# Patient Record
Sex: Female | Born: 1945 | Race: White | Hispanic: No | Smoking: Never smoker
Health system: Southern US, Community
[De-identification: ages and names within clinical notes are randomized; demographics above are authoritative.]

## PROBLEM LIST (undated history)

## (undated) DIAGNOSIS — T7840XA Allergy, unspecified, initial encounter: Secondary | ICD-10-CM

## (undated) DIAGNOSIS — D126 Benign neoplasm of colon, unspecified: Secondary | ICD-10-CM

## (undated) DIAGNOSIS — N959 Unspecified menopausal and perimenopausal disorder: Secondary | ICD-10-CM

## (undated) DIAGNOSIS — M069 Rheumatoid arthritis, unspecified: Secondary | ICD-10-CM

## (undated) DIAGNOSIS — K5790 Diverticulosis of intestine, part unspecified, without perforation or abscess without bleeding: Secondary | ICD-10-CM

## (undated) DIAGNOSIS — M26629 Arthralgia of temporomandibular joint, unspecified side: Secondary | ICD-10-CM

## (undated) DIAGNOSIS — K219 Gastro-esophageal reflux disease without esophagitis: Secondary | ICD-10-CM

## (undated) DIAGNOSIS — G4700 Insomnia, unspecified: Secondary | ICD-10-CM

## (undated) DIAGNOSIS — I480 Paroxysmal atrial fibrillation: Secondary | ICD-10-CM

## (undated) DIAGNOSIS — R002 Palpitations: Secondary | ICD-10-CM

## (undated) HISTORY — DX: Palpitations: R00.2

## (undated) HISTORY — PX: TYMPANOSTOMY: SHX2586

## (undated) HISTORY — DX: Insomnia, unspecified: G47.00

## (undated) HISTORY — DX: Benign neoplasm of colon, unspecified: D12.6

## (undated) HISTORY — PX: COLONOSCOPY: SHX174

## (undated) HISTORY — PX: NASAL SINUS SURGERY: SHX719

## (undated) HISTORY — DX: Allergy, unspecified, initial encounter: T78.40XA

## (undated) HISTORY — DX: Diverticulosis of intestine, part unspecified, without perforation or abscess without bleeding: K57.90

## (undated) HISTORY — DX: Unspecified menopausal and perimenopausal disorder: N95.9

## (undated) HISTORY — DX: Arthralgia of temporomandibular joint, unspecified side: M26.629

## (undated) HISTORY — DX: Gastro-esophageal reflux disease without esophagitis: K21.9

---

## 1997-11-30 ENCOUNTER — Ambulatory Visit (HOSPITAL_COMMUNITY): Admission: RE | Admit: 1997-11-30 | Discharge: 1997-11-30 | Payer: Self-pay | Admitting: Internal Medicine

## 1999-05-30 ENCOUNTER — Encounter (INDEPENDENT_AMBULATORY_CARE_PROVIDER_SITE_OTHER): Payer: Self-pay | Admitting: Specialist

## 1999-05-30 ENCOUNTER — Other Ambulatory Visit: Admission: RE | Admit: 1999-05-30 | Discharge: 1999-05-30 | Payer: Self-pay | Admitting: *Deleted

## 2000-09-02 ENCOUNTER — Other Ambulatory Visit: Admission: RE | Admit: 2000-09-02 | Discharge: 2000-09-02 | Payer: Self-pay | Admitting: Obstetrics and Gynecology

## 2001-10-12 ENCOUNTER — Other Ambulatory Visit: Admission: RE | Admit: 2001-10-12 | Discharge: 2001-10-12 | Payer: Self-pay | Admitting: Obstetrics and Gynecology

## 2002-03-15 ENCOUNTER — Ambulatory Visit (HOSPITAL_COMMUNITY): Admission: RE | Admit: 2002-03-15 | Discharge: 2002-03-15 | Payer: Self-pay | Admitting: Internal Medicine

## 2002-03-15 ENCOUNTER — Encounter: Payer: Self-pay | Admitting: Internal Medicine

## 2002-04-06 ENCOUNTER — Ambulatory Visit (HOSPITAL_COMMUNITY): Admission: RE | Admit: 2002-04-06 | Discharge: 2002-04-06 | Payer: Self-pay | Admitting: Critical Care Medicine

## 2002-04-06 ENCOUNTER — Encounter: Payer: Self-pay | Admitting: Critical Care Medicine

## 2002-04-08 ENCOUNTER — Encounter (INDEPENDENT_AMBULATORY_CARE_PROVIDER_SITE_OTHER): Payer: Self-pay | Admitting: Specialist

## 2002-04-08 ENCOUNTER — Encounter: Payer: Self-pay | Admitting: Critical Care Medicine

## 2002-04-08 ENCOUNTER — Ambulatory Visit: Admission: RE | Admit: 2002-04-08 | Discharge: 2002-04-08 | Payer: Self-pay | Admitting: Critical Care Medicine

## 2002-11-24 ENCOUNTER — Other Ambulatory Visit: Admission: RE | Admit: 2002-11-24 | Discharge: 2002-11-24 | Payer: Self-pay | Admitting: Obstetrics and Gynecology

## 2003-01-28 ENCOUNTER — Emergency Department (HOSPITAL_COMMUNITY): Admission: EM | Admit: 2003-01-28 | Discharge: 2003-01-28 | Payer: Self-pay | Admitting: Emergency Medicine

## 2003-10-05 DIAGNOSIS — D126 Benign neoplasm of colon, unspecified: Secondary | ICD-10-CM

## 2003-10-05 HISTORY — DX: Benign neoplasm of colon, unspecified: D12.6

## 2004-04-10 ENCOUNTER — Ambulatory Visit: Payer: Self-pay | Admitting: Internal Medicine

## 2004-05-14 ENCOUNTER — Ambulatory Visit: Payer: Self-pay | Admitting: Internal Medicine

## 2004-05-28 ENCOUNTER — Ambulatory Visit: Payer: Self-pay | Admitting: Critical Care Medicine

## 2004-06-12 ENCOUNTER — Ambulatory Visit: Payer: Self-pay | Admitting: Internal Medicine

## 2004-07-19 ENCOUNTER — Ambulatory Visit: Payer: Self-pay | Admitting: Critical Care Medicine

## 2004-07-20 ENCOUNTER — Ambulatory Visit (HOSPITAL_COMMUNITY): Admission: RE | Admit: 2004-07-20 | Discharge: 2004-07-20 | Payer: Self-pay | Admitting: Endocrinology

## 2004-07-20 ENCOUNTER — Ambulatory Visit: Payer: Self-pay | Admitting: Endocrinology

## 2004-08-23 ENCOUNTER — Ambulatory Visit: Payer: Self-pay | Admitting: Internal Medicine

## 2004-08-24 ENCOUNTER — Ambulatory Visit: Payer: Self-pay | Admitting: Cardiology

## 2004-10-09 ENCOUNTER — Ambulatory Visit: Payer: Self-pay | Admitting: Internal Medicine

## 2004-10-10 ENCOUNTER — Ambulatory Visit: Payer: Self-pay | Admitting: Internal Medicine

## 2004-10-15 ENCOUNTER — Ambulatory Visit: Payer: Self-pay | Admitting: Internal Medicine

## 2004-11-14 ENCOUNTER — Ambulatory Visit: Payer: Self-pay | Admitting: Endocrinology

## 2004-12-26 ENCOUNTER — Ambulatory Visit: Payer: Self-pay | Admitting: Critical Care Medicine

## 2005-07-15 ENCOUNTER — Ambulatory Visit: Payer: Self-pay | Admitting: Internal Medicine

## 2005-08-27 ENCOUNTER — Ambulatory Visit: Payer: Self-pay | Admitting: Endocrinology

## 2005-08-30 ENCOUNTER — Ambulatory Visit: Payer: Self-pay | Admitting: Internal Medicine

## 2005-12-26 ENCOUNTER — Ambulatory Visit: Payer: Self-pay | Admitting: Internal Medicine

## 2006-01-16 ENCOUNTER — Ambulatory Visit: Payer: Self-pay | Admitting: Critical Care Medicine

## 2006-03-04 ENCOUNTER — Ambulatory Visit: Payer: Self-pay | Admitting: Internal Medicine

## 2006-06-16 ENCOUNTER — Ambulatory Visit: Payer: Self-pay | Admitting: Critical Care Medicine

## 2006-06-19 ENCOUNTER — Ambulatory Visit: Payer: Self-pay | Admitting: Critical Care Medicine

## 2006-06-25 ENCOUNTER — Ambulatory Visit: Payer: Self-pay | Admitting: Pulmonary Disease

## 2006-07-08 ENCOUNTER — Ambulatory Visit: Payer: Self-pay | Admitting: Critical Care Medicine

## 2006-07-09 ENCOUNTER — Ambulatory Visit: Payer: Self-pay | Admitting: *Deleted

## 2006-09-18 ENCOUNTER — Ambulatory Visit: Payer: Self-pay | Admitting: Critical Care Medicine

## 2006-11-25 ENCOUNTER — Ambulatory Visit: Payer: Self-pay | Admitting: Internal Medicine

## 2006-11-25 LAB — CONVERTED CEMR LAB
Albumin: 4 g/dL (ref 3.5–5.2)
Alkaline Phosphatase: 38 units/L — ABNORMAL LOW (ref 39–117)
BUN: 15 mg/dL (ref 6–23)
Basophils Relative: 0.3 % (ref 0.0–1.0)
Calcium: 9.1 mg/dL (ref 8.4–10.5)
Chloride: 103 meq/L (ref 96–112)
Creatinine, Ser: 0.7 mg/dL (ref 0.4–1.2)
Eosinophils Absolute: 0.1 10*3/uL (ref 0.0–0.6)
GFR calc non Af Amer: 90 mL/min
HCT: 38.8 % (ref 36.0–46.0)
Hemoglobin: 13.2 g/dL (ref 12.0–15.0)
Leukocytes, UA: NEGATIVE
MCHC: 34 g/dL (ref 30.0–36.0)
MCV: 92.6 fL (ref 78.0–100.0)
Neutrophils Relative %: 57.5 % (ref 43.0–77.0)
Platelets: 263 10*3/uL (ref 150–400)
Sodium: 137 meq/L (ref 135–145)
TSH: 4.06 microintl units/mL (ref 0.35–5.50)
Total Bilirubin: 0.8 mg/dL (ref 0.3–1.2)
Total CHOL/HDL Ratio: 3.5
Total Protein, Urine: NEGATIVE mg/dL
Total Protein: 6.4 g/dL (ref 6.0–8.3)
Urine Glucose: NEGATIVE mg/dL
Urobilinogen, UA: 0.2 (ref 0.0–1.0)
VLDL: 13 mg/dL (ref 0–40)
WBC: 6 10*3/uL (ref 4.5–10.5)

## 2006-11-28 ENCOUNTER — Ambulatory Visit: Payer: Self-pay | Admitting: Critical Care Medicine

## 2006-12-02 ENCOUNTER — Ambulatory Visit: Payer: Self-pay | Admitting: Internal Medicine

## 2006-12-05 ENCOUNTER — Ambulatory Visit: Payer: Self-pay | Admitting: Internal Medicine

## 2006-12-26 ENCOUNTER — Ambulatory Visit: Payer: Self-pay | Admitting: Critical Care Medicine

## 2007-01-26 ENCOUNTER — Encounter: Payer: Self-pay | Admitting: *Deleted

## 2007-01-26 DIAGNOSIS — N959 Unspecified menopausal and perimenopausal disorder: Secondary | ICD-10-CM | POA: Insufficient documentation

## 2007-01-26 DIAGNOSIS — J479 Bronchiectasis, uncomplicated: Secondary | ICD-10-CM | POA: Insufficient documentation

## 2007-01-26 DIAGNOSIS — M26609 Unspecified temporomandibular joint disorder, unspecified side: Secondary | ICD-10-CM | POA: Insufficient documentation

## 2007-01-26 DIAGNOSIS — R002 Palpitations: Secondary | ICD-10-CM | POA: Insufficient documentation

## 2007-01-26 DIAGNOSIS — J329 Chronic sinusitis, unspecified: Secondary | ICD-10-CM | POA: Insufficient documentation

## 2007-01-26 DIAGNOSIS — G4700 Insomnia, unspecified: Secondary | ICD-10-CM

## 2007-01-26 DIAGNOSIS — H9190 Unspecified hearing loss, unspecified ear: Secondary | ICD-10-CM | POA: Insufficient documentation

## 2007-01-26 DIAGNOSIS — M810 Age-related osteoporosis without current pathological fracture: Secondary | ICD-10-CM | POA: Insufficient documentation

## 2007-02-12 ENCOUNTER — Ambulatory Visit: Payer: Self-pay | Admitting: Internal Medicine

## 2007-02-16 ENCOUNTER — Ambulatory Visit: Payer: Self-pay | Admitting: Internal Medicine

## 2007-03-03 ENCOUNTER — Ambulatory Visit: Payer: Self-pay | Admitting: Critical Care Medicine

## 2007-03-10 DIAGNOSIS — K219 Gastro-esophageal reflux disease without esophagitis: Secondary | ICD-10-CM

## 2007-03-31 ENCOUNTER — Telehealth: Payer: Self-pay | Admitting: Internal Medicine

## 2007-04-23 ENCOUNTER — Ambulatory Visit: Payer: Self-pay | Admitting: Critical Care Medicine

## 2007-04-23 DIAGNOSIS — J302 Other seasonal allergic rhinitis: Secondary | ICD-10-CM | POA: Insufficient documentation

## 2007-04-23 DIAGNOSIS — J301 Allergic rhinitis due to pollen: Secondary | ICD-10-CM | POA: Insufficient documentation

## 2007-06-04 ENCOUNTER — Ambulatory Visit: Payer: Self-pay | Admitting: Critical Care Medicine

## 2007-06-04 DIAGNOSIS — J019 Acute sinusitis, unspecified: Secondary | ICD-10-CM | POA: Insufficient documentation

## 2007-06-08 ENCOUNTER — Ambulatory Visit: Payer: Self-pay | Admitting: Critical Care Medicine

## 2007-09-23 ENCOUNTER — Ambulatory Visit: Payer: Self-pay | Admitting: Critical Care Medicine

## 2007-12-02 ENCOUNTER — Ambulatory Visit: Payer: Self-pay | Admitting: Critical Care Medicine

## 2007-12-11 ENCOUNTER — Encounter: Payer: Self-pay | Admitting: Internal Medicine

## 2008-01-26 ENCOUNTER — Telehealth: Payer: Self-pay | Admitting: Internal Medicine

## 2008-01-26 ENCOUNTER — Ambulatory Visit: Payer: Self-pay | Admitting: Critical Care Medicine

## 2008-01-29 ENCOUNTER — Ambulatory Visit: Payer: Self-pay | Admitting: Critical Care Medicine

## 2008-02-01 ENCOUNTER — Telehealth: Payer: Self-pay | Admitting: Adult Health

## 2008-02-04 ENCOUNTER — Telehealth (INDEPENDENT_AMBULATORY_CARE_PROVIDER_SITE_OTHER): Payer: Self-pay | Admitting: *Deleted

## 2008-02-05 ENCOUNTER — Ambulatory Visit: Payer: Self-pay | Admitting: Pulmonary Disease

## 2008-02-16 ENCOUNTER — Encounter: Payer: Self-pay | Admitting: Internal Medicine

## 2008-02-26 ENCOUNTER — Encounter: Payer: Self-pay | Admitting: Internal Medicine

## 2008-03-02 ENCOUNTER — Ambulatory Visit: Payer: Self-pay | Admitting: Critical Care Medicine

## 2008-03-14 ENCOUNTER — Encounter: Payer: Self-pay | Admitting: Critical Care Medicine

## 2008-03-21 ENCOUNTER — Ambulatory Visit: Payer: Self-pay | Admitting: Critical Care Medicine

## 2008-06-17 ENCOUNTER — Ambulatory Visit: Payer: Self-pay | Admitting: Endocrinology

## 2008-06-24 ENCOUNTER — Ambulatory Visit: Payer: Self-pay | Admitting: Endocrinology

## 2008-06-24 LAB — CONVERTED CEMR LAB
ALT: 14 units/L (ref 0–35)
AST: 17 units/L (ref 0–37)
Amylase: 88 units/L (ref 27–131)
Basophils Relative: 0.1 % (ref 0.0–3.0)
Bilirubin, Direct: 0.2 mg/dL (ref 0.0–0.3)
Eosinophils Relative: 1.3 % (ref 0.0–5.0)
Hemoglobin: 14.6 g/dL (ref 12.0–15.0)
MCHC: 34.3 g/dL (ref 30.0–36.0)
Neutro Abs: 4.1 10*3/uL (ref 1.4–7.7)
Platelets: 291 10*3/uL (ref 150–400)
RBC: 4.66 M/uL (ref 3.87–5.11)
Total Bilirubin: 1 mg/dL (ref 0.3–1.2)
Total Protein: 6.6 g/dL (ref 6.0–8.3)

## 2008-06-28 ENCOUNTER — Ambulatory Visit: Payer: Self-pay | Admitting: Cardiovascular Disease

## 2008-07-13 ENCOUNTER — Ambulatory Visit: Payer: Self-pay | Admitting: Critical Care Medicine

## 2008-07-22 ENCOUNTER — Telehealth (INDEPENDENT_AMBULATORY_CARE_PROVIDER_SITE_OTHER): Payer: Self-pay | Admitting: *Deleted

## 2008-08-01 ENCOUNTER — Telehealth (INDEPENDENT_AMBULATORY_CARE_PROVIDER_SITE_OTHER): Payer: Self-pay | Admitting: *Deleted

## 2008-08-03 ENCOUNTER — Ambulatory Visit: Payer: Self-pay | Admitting: Internal Medicine

## 2008-08-19 ENCOUNTER — Encounter (INDEPENDENT_AMBULATORY_CARE_PROVIDER_SITE_OTHER): Payer: Self-pay | Admitting: *Deleted

## 2008-09-20 ENCOUNTER — Ambulatory Visit: Payer: Self-pay | Admitting: Gastroenterology

## 2008-10-06 ENCOUNTER — Ambulatory Visit: Payer: Self-pay | Admitting: Gastroenterology

## 2008-10-06 ENCOUNTER — Encounter: Payer: Self-pay | Admitting: Internal Medicine

## 2008-10-06 ENCOUNTER — Encounter: Payer: Self-pay | Admitting: Gastroenterology

## 2008-10-09 ENCOUNTER — Encounter: Payer: Self-pay | Admitting: Gastroenterology

## 2008-10-11 ENCOUNTER — Telehealth: Payer: Self-pay | Admitting: Gastroenterology

## 2008-12-19 ENCOUNTER — Ambulatory Visit: Payer: Self-pay | Admitting: Internal Medicine

## 2008-12-19 LAB — CONVERTED CEMR LAB
Albumin: 4 g/dL (ref 3.5–5.2)
Alkaline Phosphatase: 41 units/L (ref 39–117)
Basophils Absolute: 0 10*3/uL (ref 0.0–0.1)
Bilirubin Urine: NEGATIVE
CO2: 33 meq/L — ABNORMAL HIGH (ref 19–32)
Chloride: 109 meq/L (ref 96–112)
Creatinine, Ser: 0.7 mg/dL (ref 0.4–1.2)
Eosinophils Absolute: 0.1 10*3/uL (ref 0.0–0.7)
Eosinophils Relative: 2.3 % (ref 0.0–5.0)
GFR calc non Af Amer: 89.61 mL/min (ref >60)
HDL: 58.1 mg/dL (ref >39.00)
Hemoglobin: 13.7 g/dL (ref 12.0–15.0)
MCV: 93.5 fL (ref 78.0–100.0)
Monocytes Absolute: 0.5 10*3/uL (ref 0.1–1.0)
Nitrite: NEGATIVE
Potassium: 4.2 meq/L (ref 3.5–5.1)
RBC: 4.22 M/uL (ref 3.87–5.11)
RDW: 11.9 % (ref 11.5–14.6)
Sodium: 146 meq/L — ABNORMAL HIGH (ref 135–145)
Specific Gravity, Urine: 1.02 (ref 1.000–1.030)
Total Protein: 6.4 g/dL (ref 6.0–8.3)
Triglycerides: 81 mg/dL (ref 0.0–149.0)
VLDL: 16.2 mg/dL (ref 0.0–40.0)
WBC: 5.4 10*3/uL (ref 4.5–10.5)
pH: 7 (ref 5.0–8.0)

## 2008-12-27 ENCOUNTER — Ambulatory Visit: Payer: Self-pay | Admitting: Internal Medicine

## 2008-12-27 DIAGNOSIS — K648 Other hemorrhoids: Secondary | ICD-10-CM

## 2009-01-16 ENCOUNTER — Ambulatory Visit: Payer: Self-pay | Admitting: Critical Care Medicine

## 2009-02-09 ENCOUNTER — Ambulatory Visit: Payer: Self-pay | Admitting: Internal Medicine

## 2009-02-24 ENCOUNTER — Encounter: Payer: Self-pay | Admitting: Internal Medicine

## 2009-04-11 ENCOUNTER — Ambulatory Visit: Payer: Self-pay | Admitting: Internal Medicine

## 2009-09-22 ENCOUNTER — Ambulatory Visit: Payer: Self-pay | Admitting: Critical Care Medicine

## 2009-10-12 ENCOUNTER — Telehealth: Payer: Self-pay | Admitting: Internal Medicine

## 2009-10-13 ENCOUNTER — Ambulatory Visit: Payer: Self-pay | Admitting: Internal Medicine

## 2009-10-13 LAB — CONVERTED CEMR LAB
Basophils Relative: 0.4 % (ref 0.0–3.0)
Eosinophils Absolute: 0 10*3/uL (ref 0.0–0.7)
Hemoglobin: 12.9 g/dL (ref 12.0–15.0)
Lymphs Abs: 0.9 10*3/uL (ref 0.7–4.0)
MCHC: 35 g/dL (ref 30.0–36.0)
MCV: 94.1 fL (ref 78.0–100.0)
Monocytes Absolute: 1.2 10*3/uL — ABNORMAL HIGH (ref 0.1–1.0)
Neutro Abs: 11.7 10*3/uL — ABNORMAL HIGH (ref 1.4–7.7)
RBC: 3.92 M/uL (ref 3.87–5.11)
RDW: 12.7 % (ref 11.5–14.6)

## 2009-10-16 ENCOUNTER — Ambulatory Visit: Payer: Self-pay | Admitting: Internal Medicine

## 2009-10-16 ENCOUNTER — Telehealth: Payer: Self-pay | Admitting: Internal Medicine

## 2009-10-17 ENCOUNTER — Telehealth: Payer: Self-pay | Admitting: Internal Medicine

## 2009-10-18 ENCOUNTER — Telehealth (INDEPENDENT_AMBULATORY_CARE_PROVIDER_SITE_OTHER): Payer: Self-pay | Admitting: *Deleted

## 2009-10-19 ENCOUNTER — Telehealth: Payer: Self-pay | Admitting: Internal Medicine

## 2009-10-20 ENCOUNTER — Telehealth: Payer: Self-pay | Admitting: Internal Medicine

## 2009-10-20 ENCOUNTER — Ambulatory Visit: Payer: Self-pay | Admitting: Internal Medicine

## 2009-10-20 DIAGNOSIS — F4322 Adjustment disorder with anxiety: Secondary | ICD-10-CM | POA: Insufficient documentation

## 2009-10-30 ENCOUNTER — Telehealth: Payer: Self-pay | Admitting: Internal Medicine

## 2009-10-31 ENCOUNTER — Ambulatory Visit: Payer: Self-pay | Admitting: Internal Medicine

## 2009-10-31 LAB — CONVERTED CEMR LAB
Basophils Absolute: 0 10*3/uL (ref 0.0–0.1)
Eosinophils Absolute: 0 10*3/uL (ref 0.0–0.7)
Lymphocytes Relative: 16.4 % (ref 12.0–46.0)
MCHC: 34.3 g/dL (ref 30.0–36.0)
Neutro Abs: 5.3 10*3/uL (ref 1.4–7.7)
Neutrophils Relative %: 72.9 % (ref 43.0–77.0)
Platelets: 406 10*3/uL — ABNORMAL HIGH (ref 150.0–400.0)
RDW: 13.1 % (ref 11.5–14.6)

## 2009-11-01 ENCOUNTER — Encounter: Payer: Self-pay | Admitting: Critical Care Medicine

## 2009-11-01 ENCOUNTER — Telehealth: Payer: Self-pay | Admitting: Critical Care Medicine

## 2009-11-02 ENCOUNTER — Ambulatory Visit (HOSPITAL_BASED_OUTPATIENT_CLINIC_OR_DEPARTMENT_OTHER): Admission: RE | Admit: 2009-11-02 | Discharge: 2009-11-02 | Payer: Self-pay | Admitting: Critical Care Medicine

## 2009-11-02 ENCOUNTER — Ambulatory Visit: Payer: Self-pay | Admitting: Critical Care Medicine

## 2009-11-02 ENCOUNTER — Ambulatory Visit: Payer: Self-pay | Admitting: Diagnostic Radiology

## 2010-02-06 ENCOUNTER — Ambulatory Visit: Payer: Self-pay | Admitting: Critical Care Medicine

## 2010-03-23 ENCOUNTER — Encounter: Payer: Self-pay | Admitting: Internal Medicine

## 2010-05-27 ENCOUNTER — Encounter: Payer: Self-pay | Admitting: Internal Medicine

## 2010-05-27 ENCOUNTER — Encounter: Payer: Self-pay | Admitting: Critical Care Medicine

## 2010-06-03 LAB — CONVERTED CEMR LAB
BUN: 18 mg/dL (ref 6–23)
CO2: 31 meq/L (ref 19–32)
Calcium: 9.8 mg/dL (ref 8.4–10.5)
GFR calc Af Amer: 109 mL/min
Glucose, Urine, Semiquant: NEGATIVE
Nitrite: NEGATIVE
Protein, U semiquant: NEGATIVE
Sodium: 140 meq/L (ref 135–145)
Specific Gravity, Urine: <1.005
Urobilinogen, UA: 0.2
pH: 5

## 2010-06-07 NOTE — Progress Notes (Signed)
Summary: Call pt to check on status   Phone Note Outgoing Call   Reason for Call: Confirm/change Appt Summary of Call: call pt and see how she is doing,  if worse or no better will need OV Initial call taken by: Storm Frisk MD,  October 18, 2009 3:35 PM  Follow-up for Phone Call        Spoke with pt.  Pt states she is "a little better," temp has came down some-now around 99, was able to cough up some mucus this am-thick, yellow.  Took 3rd dose of avelox today.  States she will wait a few more days-if no more improvement or worse will call office for OV.   Pt did have qustion for PW.  Was given 7 days of avelox by [facility], states PW usually gives her 10days.  would like to know if she needs to take the additional 3 days or wait to see how she feels.  WIll forward message to PW to address.  Follow-up by: Gweneth Dimitri RN,  October 18, 2009 4:04 PM  Additional Follow-up for Phone Call Additional follow up Details #1::        she needs 10days  go ahead and call in three days further  Additional Follow-up by: Storm Frisk MD,  October 18, 2009 4:26 PM    Additional Follow-up for Phone Call Additional follow up Details #2::    Spoke with pt and advised that PW does want her to have an additional 3 days of avelox. Rx was [facility]. Follow-up by: Vernie Murders,  October 18, 2009 4:34 PM  New/Updated Medications: AVELOX 400 MG TABS (MOXIFLOXACIN HCL) 1 by mouth once daily until gone Prescriptions: AVELOX 400 MG TABS (MOXIFLOXACIN HCL) 1 by mouth once daily until gone  #3 x 0   Entered by:   Vernie Murders   Authorized by:   Storm Frisk MD   Signed by:   Vernie Murders on 10/18/2009   Method used:   Electronically to        [facility]* (retail)       [address]       [place], [place]  [id number]       Ph: [phone]       Fax: [phone]   RxID:   [id number]

## 2010-06-07 NOTE — Assessment & Plan Note (Signed)
Summary: f/u - continued cough -Dr Delford Field not in office today/SD   Vital Signs:  Patient profile:   65 year old female Height:      62 inches Weight:      104 pounds BMI:     19.09 O2 Sat:      97 % on Room air Temp:     98.3 degrees F oral Pulse rate:   88 / minute BP sitting:   128 / 72  (left arm) Cuff size:   regular  Vitals Entered By: Bill Salinas CMA (October 20, 2009 3:14 PM)  O2 Flow:  Room air CC: pt here with c/o continued cough with decrease appetite, nausea and occasional yellow mucous. She also has c/o abd discomfort/ ab   Primary Care Provider:  Dr. Debby Bud  CC:  pt here with c/o continued cough with decrease appetite and nausea and occasional yellow mucous. She also has c/o abd discomfort/ ab.  History of Present Illness: Ms. Kesselman is making her 3rd visit related to her respiratory infection. She is completing 10 days of Avelox. She has had no fever and her sputum has cleared. She continues to have a cough and to feel bad. She had a bad reaction to benzonatate.  She admits that she gets very anxious when she has an infection and worries about getting another severe sinus infection. This is particularly a problem when she travels and she has a trip to [place] coming up.  Current Medications (verified): 1)  Multivitamins   Tabs (Multiple Vitamin) .... Take 1 By Mouth Qd 2)  Calcium 600 1500 Mg  Tabs (Calcium Carbonate) .... Take 2 By Mouth Qd 3)  Flonase 50 Mcg/act Susp (Fluticasone Propionate) .... Two Sprays Each Nostril Once Daily 4)  Mucinex Dm 30-600 Mg  Tb12 (Dextromethorphan-Guaifenesin) .... Take 1 Tablet By Mouth Two Times A Day As Needed 5)  Super Probiotic   Caps (Misc Intestinal Flora Regulat) .... Prn 6)  Vitamin D 1000 Unit Tabs (Cholecalciferol) .... Take 1 Tablet By Mouth Once A Day 7)  Claritin 10 Mg Tabs (Loratadine) .... Take 1 Tablet By Mouth Once A Day 8)  Vitamin C 500 Mg Tabs (Ascorbic Acid) .... Once Daily 9)  Vagifem 25 Mcg Tabs (Estradiol)  .... 2 Tabs Weekly 10)  Zantac 150 Mg Tabs (Ranitidine Hcl) .Marland Kitchen.. 1 Every Other Day 11)  Mupirocin 2 % Oint (Mupirocin) .... Apply To Right Nostril  Two Times A Day As Needed 12)  Decongestant 30 Mg Tabs (Pseudoephedrine Hcl) .... As Needed 13)  Protonix 40 Mg Tbec (Pantoprazole Sodium) .Marland Kitchen.. 1 By Mouth Once Daily 14)  Avelox 400 Mg Tabs (Moxifloxacin Hcl) .Marland Kitchen.. 1 By Mouth Once Daily X 7 15)  Promethazine-Codeine 6.25-10 Mg/70ml Syrp (Promethazine-Codeine) .Marland Kitchen.. 1 Q 6 As Needed Cough 16)  Benzonatate 100 Mg Caps (Benzonatate) .Marland Kitchen.. 1 Three Times A Day As Needed Cough 17)  Avelox 400 Mg Tabs (Moxifloxacin Hcl) .Marland Kitchen.. 1 By Mouth Once Daily Until Gone 18)  Prilosec Otc 20 Mg Tbec (Omeprazole Magnesium) .Marland Kitchen.. 1 Once Daily When Taking Antibiotics  Allergies (verified): 1)  ! Sulfa 2)  ! Augmentin 3)  ! Cipro 4)  ! Benzonatate (Benzonatate) PMH-FH-SH reviewed-no changes except otherwise noted  Review of Systems       The patient complains of prolonged cough.  The patient denies anorexia, fever, weight loss, decreased hearing, chest pain, dyspnea on exertion, hemoptysis, abdominal pain, melena, severe indigestion/heartburn, muscle weakness, and enlarged lymph nodes.    Physical Exam  General:  Slightly haggard appearing woman in no distress Head:  normocephalic and atraumatic.   Eyes:  corneas and lenses clear and no injection.   Lungs:  normal respiratory effort, normal breath sounds, no crackles, and no wheezes.   Heart:  normal rate and regular rhythm.     Impression & Recommendations:  Problem # 1:  BRONCHIECTASIS (ICD-494.0) Patient continues to improve: no fever, no purulent sputum. She does continue to cough and feel "punk."  Plan - no need for addtional antibiotics           use promethazine/codein syrup at bedtime           use robitussin DM or equivalent during the day           continue with Nettie pot treatments           hydrate.  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  Plan - alprazolam 0.25 mg 1 or 2 every 6 hours as needed for anxiety  Complete Medication List: 1)  Multivitamins Tabs (Multiple vitamin) .... Take 1 by mouth qd 2)  Calcium 600 1500 Mg Tabs (Calcium carbonate) .... Take 2 by mouth qd 3)  Flonase 50 Mcg/act Susp (Fluticasone propionate) .... Two sprays each nostril once daily 4)  Mucinex Dm 30-600 Mg Tb12 (Dextromethorphan-guaifenesin) .... Take 1 tablet by mouth two times a day as needed 5)  Super Probiotic Caps (Misc intestinal flora regulat) .... Prn 6)  Vitamin D 1000 Unit Tabs (Cholecalciferol) .... Take 1 tablet by mouth once a day 7)  Claritin 10 Mg Tabs (Loratadine) .... Take 1 tablet by mouth once a day 8)  Vitamin C 500 Mg Tabs (Ascorbic acid) .... Once daily 9)  Vagifem 25 Mcg Tabs (Estradiol) .... 2 tabs weekly 10)  Zantac 150 Mg Tabs (Ranitidine hcl) .Marland Kitchen.. 1 every other day 11)  Mupirocin 2 % Oint (Mupirocin) .... Apply to right nostril  two times a day as needed 12)  Decongestant 30 Mg Tabs (Pseudoephedrine hcl) .... As needed 13)  Protonix 40 Mg Tbec (Pantoprazole sodium) .Marland Kitchen.. 1 by mouth once daily 14)  Promethazine-codeine 6.25-10 Mg/44ml Syrp (Promethazine-codeine) .Marland Kitchen.. 1 q 6 as needed cough 15)  Prilosec Otc 20 Mg Tbec (Omeprazole magnesium) .Marland Kitchen.. 1 once daily when taking antibiotics 16)  Alprazolam 0.25 Mg Tabs (Alprazolam) .Marland Kitchen.. 1 or 2 by mouth q 6 as needed anxiety Prescriptions: ALPRAZOLAM 0.25 MG TABS (ALPRAZOLAM) 1 or 2 by mouth q 6 as needed anxiety  #60 x 1   Entered and Authorized by:   Jacques Navy MD   Signed by:   Jacques Navy MD on 10/22/2009   Method used:   Handwritten   RxID:   [id number]

## 2010-06-07 NOTE — Miscellaneous (Signed)
Summary: Orders Update   Clinical Lists Changes  Orders: Added new Referral order of Radiology Referral (Radiology) - Signed 

## 2010-06-07 NOTE — Progress Notes (Signed)
Summary: Cough  Phone Note Call from Patient   Summary of Call: Pt called w/update. She is improving but continues to have a "very bronchial" cough. (see other phone note from pulmonary, she was given additional 3 days of avelox.) Just FYI Initial call taken by: Lamar Sprinkles, CMA,  October 19, 2009 8:45 AM

## 2010-06-07 NOTE — Progress Notes (Signed)
Summary: not feeling well  Phone Note Call from Patient   Caller: Patient Summary of Call: Patient called this am/ also left MD an email stating that she was seen on 10/13/2009 and not any better. She c/o continued fever, non productive cough and chest tightness. She feels that prior antibitoic is not working. Please advise Thanks Initial call taken by: Rock Nephew CMA,  October 16, 2009 8:14 AM  Follow-up for Phone Call        Ami will call. Last round of augmentin caused GI discomfort but this may be worth trying. Also reports a reaction to Cipro - what was the nature of that reaction.  Follow-up by: Jacques Navy MD,  October 16, 2009 8:58 AM  Additional Follow-up for Phone Call Additional follow up Details #1::        pt states she feels worse since speaking with triage nurse. I told pt to come on in this am.  Additional Follow-up by: Ami Bullins CMA,  October 16, 2009 9:14 AM

## 2010-06-07 NOTE — Progress Notes (Signed)
Summary: REQ FOR RX  Phone Note Call from Patient   Summary of Call: Patient is requesting rx for cough med. Delsym has given no relief.  Initial call taken by: Lamar Sprinkles, CMA,  October 17, 2009 9:09 AM  Follow-up for Phone Call        promethazine/cod 1 tsp q 6 as needed 8 oz ; benzonatate 100mg  three times a day, #30.  Follow-up by: Jacques Navy MD,  October 17, 2009 1:19 PM  Additional Follow-up for Phone Call Additional follow up Details #1::        Called in rx's, # busy Additional Follow-up by: Lamar Sprinkles, CMA,  October 17, 2009 2:33 PM    Additional Follow-up for Phone Call Additional follow up Details #2::    Pt informed  Follow-up by: Lamar Sprinkles, CMA,  October 17, 2009 3:43 PM  New/Updated Medications: PROMETHAZINE-CODEINE 6.25-10 MG/5ML SYRP (PROMETHAZINE-CODEINE) 1 q 6 as needed cough BENZONATATE 100 MG CAPS (BENZONATATE) 1 three times a day as needed cough Prescriptions: BENZONATATE 100 MG CAPS (BENZONATATE) 1 three times a day as needed cough  #30 x 0   Entered by:   Lamar Sprinkles, CMA   Authorized by:   Jacques Navy MD   Signed by:   Lamar Sprinkles, CMA on 10/17/2009   Method used:   Telephoned to ...       [facility]* (retail)       [address]       [place], [place]  [id number]       Ph: [phone]       Fax: [phone]   RxID:   [id number] PROMETHAZINE-CODEINE 6.25-10 MG/5ML SYRP (PROMETHAZINE-CODEINE) 1 q 6 as needed cough  #8 x 0   Entered by:   Lamar Sprinkles, CMA   Authorized by:   Jacques Navy MD   Signed by:   Lamar Sprinkles, CMA on 10/17/2009   Method used:   Telephoned to ...       [facility]* (retail)       [address]       [place], [place]  [id number]       Ph: [phone]       Fax: [phone]   RxID:   [id number]

## 2010-06-07 NOTE — Progress Notes (Signed)
Summary: Sinus infection  Phone Note Call from Patient Call back at [facility] Phone [phone]   Summary of Call: Pt was given prednisone from ENT recently for sinus problems. She thinks she may have sinus infection. Needs office visit w/avail MD.  Initial call taken by: Lamar Sprinkles, CMA,  October 12, 2009 10:03 AM  Follow-up for Phone Call        Pt will NOT see any other M.D. - she will only see Dr Debby Bud and is requesting to see him please advise. Follow-up by: Verdell Face,  October 12, 2009 10:51 AM  Additional Follow-up for Phone Call Additional follow up Details #1::        Ist thing in the AM- 8:45 Additional Follow-up by: Jacques Navy MD,  October 12, 2009 3:57 PM    Additional Follow-up for Phone Call Additional follow up Details #2::    Patient notified and will be here by 8:30am. Follow-up by: Lucious Groves,  October 12, 2009 4:22 PM

## 2010-06-07 NOTE — Progress Notes (Signed)
Summary: needs appt   Phone Note Outgoing Call   Reason for Call: Discuss lab or test results Summary of Call: this pt needs an  appt soon with me  Initial call taken by: Storm Frisk MD,  November 01, 2009 2:00 PM  Follow-up for Phone Call        Called, spoke with pt.  Pt informed per PW she needs ov soon.  Pt states she cannot make this appt until after July 14 because she is going to be out of town.  OV sceduled with PW for July 19 at 415.  Pt aware.   Follow-up by: Gweneth Dimitri RN,  November 01, 2009 4:15 PM

## 2010-06-07 NOTE — Progress Notes (Signed)
Summary: CXR?  Phone Note Call from Patient Call back at [facility] Phone [phone]   Summary of Call: Pt says she was told she needed a f/u cxr. Is this ok to do wednesday? Also wants avelox for "backup" for trip she will be going on soon.  Initial call taken by: Lamar Sprinkles, CMA,  October 30, 2009 9:44 AM  Follow-up for Phone Call        OK for cxr PA-Lat Wedns - follow-up 494.0.  OK for Rx for Avelox 400mg  # 7 to take for resp or sinus infection that may develop while on vacation to [place]. Follow-up by: Jacques Navy MD,  October 30, 2009 11:34 AM  Additional Follow-up for Phone Call Additional follow up Details #1::        Patient notified.Alvy Beal Archie CMA  October 30, 2009 12:59 PM     New/Updated Medications: AVELOX 400 MG TABS (MOXIFLOXACIN HCL) Take 1 tablet by mouth once a day Prescriptions: AVELOX 400 MG TABS (MOXIFLOXACIN HCL) Take 1 tablet by mouth once a day  #7 x 0   Entered by:   Rock Nephew CMA   Authorized by:   Jacques Navy MD   Signed by:   Rock Nephew CMA on 10/30/2009   Method used:   Electronically to        [facility]* (retail)       [address]       [place], [place]  [id number]       Ph: [phone]       Fax: [phone]   RxID:   [id number]

## 2010-06-07 NOTE — Assessment & Plan Note (Signed)
Summary: ov   Vital Signs:  Patient profile:   65 year old female Height:      62 inches Weight:      105 pounds BMI:     19.27 O2 Sat:      97 % on Room air Temp:     98.5 degrees F oral Pulse rate:   102 / minute BP sitting:   106 / 64  (left arm) Cuff size:   regular  Vitals Entered By: Bill Salinas CMA (October 16, 2009 9:42 AM)  O2 Flow:  Room air CC: pt here with continued fever, non productive cough, tightness in chest and nausea/ ab   Primary Care Provider:  Dr. Debby Bud  CC:  pt here with continued fever, non productive cough, and tightness in chest and nausea/ ab.  History of Present Illness: Patient seen Friday, June 10th. She was started on omnicef. Chest x-ray revealed nodular densities bibasilar locations without infiltrates or effusion. CBC revealed a mild leukocytosis. Over the w/e she has continued to run fevers, have a cough that is mildly productive , have SOB. Not in acute distress with not dyspnea at rest.   Current Medications (verified): 1)  Multivitamins   Tabs (Multiple Vitamin) .... Take 1 By Mouth Qd 2)  Calcium 600 1500 Mg  Tabs (Calcium Carbonate) .... Take 2 By Mouth Qd 3)  Flonase 50 Mcg/act Susp (Fluticasone Propionate) .... Two Sprays Each Nostril Once Daily 4)  Mucinex Dm 30-600 Mg  Tb12 (Dextromethorphan-Guaifenesin) .... Take 1 Tablet By Mouth Two Times A Day As Needed 5)  Super Probiotic   Caps (Misc Intestinal Flora Regulat) .... Prn 6)  Vitamin D 1000 Unit Tabs (Cholecalciferol) .... Take 1 Tablet By Mouth Once A Day 7)  Claritin 10 Mg Tabs (Loratadine) .... Take 1 Tablet By Mouth Once A Day 8)  Vitamin C 500 Mg Tabs (Ascorbic Acid) .... Once Daily 9)  Vagifem 25 Mcg Tabs (Estradiol) .... 2 Tabs Weekly 10)  Zantac 150 Mg Tabs (Ranitidine Hcl) .Marland Kitchen.. 1 Every Other Day 11)  Mupirocin 2 % Oint (Mupirocin) .... Apply To Right Nostril  Two Times A Day As Needed 12)  Decongestant 30 Mg Tabs (Pseudoephedrine Hcl) .... As Needed 13)  Protonix 40 Mg Tbec  (Pantoprazole Sodium) .Marland Kitchen.. 1 By Mouth Once Daily 14)  Cefdinir 300 Mg Caps (Cefdinir) .Marland Kitchen.. 1 By Mouth Two Times A Day X 7 For Bronchiectasis  Allergies (verified): 1)  ! Sulfa 2)  ! Augmentin 3)  ! Cipro PMH-FH-SH reviewed-no changes except otherwise noted  Review of Systems       The patient complains of fever, dyspnea on exertion, prolonged cough, and headaches.  The patient denies anorexia, weight loss, weight gain, vision loss, chest pain, peripheral edema, hemoptysis, abdominal pain, muscle weakness, suspicious skin lesions, depression, enlarged lymph nodes, and angioedema.    Physical Exam  General:  Slender WNWD white female in no distress Head:  normocephalic and atraumatic.  No tenderness to percussion over frontal or maxillary sinuses Eyes:  pupils equal, pupils round, corneas and lenses clear, and no injection.   Ears:  TMs ok Mouth:  Throat clear Neck:  supple.   Chest Wall:  no deformities.   Lungs:  normal respiratory effort, no intercostal retractions, no accessory muscle use, normal breath sounds, no fremitus, and no wheezes.   Heart:  normal rate and regular rhythm.   Skin:  turgor normal, color normal, no rashes, and no edema.   Cervical Nodes:  no  anterior cervical adenopathy and no posterior cervical adenopathy.   Psych:  Oriented X3, memory intact for recent and remote, normally interactive, and good eye contact.     Impression & Recommendations:  Problem # 1:  BRONCHIECTASIS (ICD-494.0) Persistent infection with leukocytosis and fever. CXray with nodular changes. Poor response to omnicef with persistent fevers. On questioning last round of Augmentin lead to diarrhea; last dose of cipro resulted in red rash across the abdomen.  Plan - continue all supportive treatment           D/C omnicef and start Avelox 400mg s once daily            for lack of improvement or intolerance of avelox will ask her to see Dr. Delford Field.   Complete Medication List: 1)   Multivitamins Tabs (Multiple vitamin) .... Take 1 by mouth qd 2)  Calcium 600 1500 Mg Tabs (Calcium carbonate) .... Take 2 by mouth qd 3)  Flonase 50 Mcg/act Susp (Fluticasone propionate) .... Two sprays each nostril once daily 4)  Mucinex Dm 30-600 Mg Tb12 (Dextromethorphan-guaifenesin) .... Take 1 tablet by mouth two times a day as needed 5)  Super Probiotic Caps (Misc intestinal flora regulat) .... Prn 6)  Vitamin D 1000 Unit Tabs (Cholecalciferol) .... Take 1 tablet by mouth once a day 7)  Claritin 10 Mg Tabs (Loratadine) .... Take 1 tablet by mouth once a day 8)  Vitamin C 500 Mg Tabs (Ascorbic acid) .... Once daily 9)  Vagifem 25 Mcg Tabs (Estradiol) .... 2 tabs weekly 10)  Zantac 150 Mg Tabs (Ranitidine hcl) .Marland Kitchen.. 1 every other day 11)  Mupirocin 2 % Oint (Mupirocin) .... Apply to right nostril  two times a day as needed 12)  Decongestant 30 Mg Tabs (Pseudoephedrine hcl) .... As needed 13)  Protonix 40 Mg Tbec (Pantoprazole sodium) .Marland Kitchen.. 1 by mouth once daily 14)  Avelox 400 Mg Tabs (Moxifloxacin hcl) .Marland Kitchen.. 1 by mouth once daily x 7 Prescriptions: AVELOX 400 MG TABS (MOXIFLOXACIN HCL) 1 by mouth once daily x 7  #7 x 0   Entered and Authorized by:   Jacques Navy MD   Signed by:   Jacques Navy MD on 10/16/2009   Method used:   Electronically to        [facility]* (retail)       [address]       [place], [place]  [id number]       Ph: [phone]       Fax: [phone]   RxID:   [id number]

## 2010-06-07 NOTE — Assessment & Plan Note (Signed)
Summary: 830 APPT PER MD/SINUS INFECTION/KB   Vital Signs:  Patient profile:   65 year old female Weight:      105 pounds BMI:     19.27 Temp:     98.3 degrees F Pulse rate:   102 / minute BP sitting:   86 / 70  (left arm) Cuff size:   regular  Vitals Entered By: Lamar Sprinkles, CMA (October 13, 2009 8:43 AM) CC: ? sinus infection - has been to ENT recently   Primary Care Provider:  Dr. Debby Bud  CC:  ? sinus infection - has been to ENT recently.  History of Present Illness: Starting Weds 6/8 onset of mucus, cough, fevers to 101.2 last night and she has been running 99. she has had chills. No prudcutive sputum. she has been on prednisone which she completed May 31s.  She is very fatigued. Feels like when she was previously diagnosed with bronchiectasis. Also concerned if this is reflux related.   Current Medications (verified): 1)  Multivitamins   Tabs (Multiple Vitamin) .... Take 1 By Mouth Qd 2)  Calcium 600 1500 Mg  Tabs (Calcium Carbonate) .... Take 2 By Mouth Qd 3)  Flonase 50 Mcg/act Susp (Fluticasone Propionate) .... Two Sprays Each Nostril Once Daily 4)  Mucinex Dm 30-600 Mg  Tb12 (Dextromethorphan-Guaifenesin) .... Take 1 Tablet By Mouth Two Times A Day As Needed 5)  Super Probiotic   Caps (Misc Intestinal Flora Regulat) .... Prn 6)  Vitamin D 1000 Unit Tabs (Cholecalciferol) .... Take 1 Tablet By Mouth Once A Day 7)  Claritin 10 Mg Tabs (Loratadine) .... Take 1 Tablet By Mouth Once A Day 8)  Vitamin C 500 Mg Tabs (Ascorbic Acid) .... Once Daily 9)  Vagifem 25 Mcg Tabs (Estradiol) .... 2 Tabs Weekly 10)  Zantac 150 Mg Tabs (Ranitidine Hcl) .Marland Kitchen.. 1 Every Other Day 11)  Mupirocin 2 % Oint (Mupirocin) .... Apply To Right Nostril  Two Times A Day As Needed 12)  Decongestant 30 Mg Tabs (Pseudoephedrine Hcl) .... As Needed 13)  Protonix 40 Mg Tbec (Pantoprazole Sodium) .Marland Kitchen.. 1 By Mouth Once Daily  Allergies (verified): 1)  ! Sulfa 2)  ! Augmentin 3)  ! 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  Hx of seizures. Father deceased 79, cardiomyopathy. MGF pancreatic or gastric CA.   Brother '48, arteriovenous malformations, o/w healthy Sister '60, healthy Multiple uncles (paternal) with cancer.  Social History: Last updated: 31-Dec-2008 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   Patient never smoked.  EtOH: 2/week.   No other drug use.    Risk Factors: Smoking Status: never (09/22/2009)  Review of Systems       The patient complains of fever, chest pain, prolonged cough, and headaches.  The patient denies anorexia, weight loss, weight gain, decreased hearing, hoarseness, syncope, dyspnea on exertion, peripheral edema, abdominal pain, muscle weakness, difficulty walking, and enlarged lymph nodes.    Physical Exam  General:  slender white female in no distress Head:  no tenderness to percussion over frontal or maxillary sinuses Eyes:  corneas and lenses clear and no  injection.   Ears:  tube in right TM, left TM normal Mouth:  throat clear Neck:  supple.   Lungs:  normal respiratory effort, no accessory muscle use, no crackles, and no wheezes.   Heart:  normal rate and regular rhythm.   Neurologic:  alert & oriented X3, cranial nerves II-XII intact, and gait normal.   Skin:  turgor normal and color normal.   Cervical Nodes:  no anterior cervical adenopathy and  no posterior cervical adenopathy.   Psych:  Oriented X3 and memory intact for recent and remote.     Impression & Recommendations:  Problem # 1:  BRONCHIECTASIS (ICD-494.0) Patient with known bronchiectasis now with cough, non-productive, fever, chills and fatigue. No evidence of sinus infection.  Plan - CBC, CXR           Cefdinir 300mg  two times a day x 7  Orders: TLB-CBC Platelet - w/Differential (85025-CBCD) T-2 View CXR (71020TC)  Complete Medication List: 1)  Multivitamins Tabs (Multiple vitamin) .... Take 1 by mouth qd 2)  Calcium 600 1500 Mg Tabs (Calcium carbonate) .... Take 2 by mouth qd 3)  Flonase 50 Mcg/act Susp (Fluticasone propionate) .... Two sprays each nostril once daily 4)  Mucinex Dm 30-600 Mg Tb12 (Dextromethorphan-guaifenesin) .... Take 1 tablet by mouth two times a day as needed 5)  Super Probiotic Caps (Misc intestinal flora regulat) .... Prn 6)  Vitamin D 1000 Unit Tabs (Cholecalciferol) .... Take 1 tablet by mouth once a day 7)  Claritin 10 Mg Tabs (Loratadine) .... Take 1 tablet by mouth once a day 8)  Vitamin C 500 Mg Tabs (Ascorbic acid) .... Once daily 9)  Vagifem 25 Mcg Tabs (Estradiol) .... 2 tabs weekly 10)  Zantac 150 Mg Tabs (Ranitidine hcl) .Marland Kitchen.. 1 every other day 11)  Mupirocin 2 % Oint (Mupirocin) .... Apply to right nostril  two times a day as needed 12)  Decongestant 30 Mg Tabs (Pseudoephedrine hcl) .... As needed 13)  Protonix 40 Mg Tbec (Pantoprazole sodium) .Marland Kitchen.. 1 by mouth once daily 14)  Cefdinir 300 Mg Caps (Cefdinir) .Marland Kitchen.. 1 by mouth two times a day x 7 for bronchiectasis Prescriptions: CEFDINIR 300 MG CAPS (CEFDINIR) 1 by mouth two times a day x 7 for bronchiectasis  #14 x 0   Entered and Authorized by:   Jacques Navy MD   Signed by:   Jacques Navy MD on 10/13/2009   Method used:   Electronically to        [facility]* (retail)       [address]       [place], [place]  [id number]       Ph: [phone]        Fax: [phone]   RxID:   [id number]

## 2010-06-07 NOTE — Assessment & Plan Note (Signed)
Summary: NOT FEELING WELL-SORE THROAT--STC   Vital Signs:  Patient profile:   65 year old female Height:      62 inches Weight:      104 pounds BMI:     19.09 O2 Sat:      98 % on Room air Temp:     97.5 degrees F oral Pulse rate:   96 / minute BP sitting:   98 / 62  (left arm) Cuff size:   regular  Vitals Entered By: Bill Salinas CMA (October 31, 2009 9:53 AM)  O2 Flow:  Room air CC: pt still not feeling well after round of antibiotic/ ab   Primary Care Provider:  Dr. Debby Bud  CC:  pt still not feeling well after round of antibiotic/ ab.  History of Present Illness: Patient presents for persistent symptoms of congestions, minor sore throat and not feeling well. she has completed an extended course of Avelox. She has had no recent fever, rigors, facial pain. She has had clear drainage including with a nettie pot. She has no SOB or respiratory distress.   Current Medications (verified): 1)  Multivitamins   Tabs (Multiple Vitamin) .... Take 1 By Mouth Qd 2)  Calcium 600 1500 Mg  Tabs (Calcium Carbonate) .... Take 2 By Mouth Qd 3)  Flonase 50 Mcg/act Susp (Fluticasone Propionate) .... Two Sprays Each Nostril Once Daily 4)  Mucinex Dm 30-600 Mg  Tb12 (Dextromethorphan-Guaifenesin) .... Take 1 Tablet By Mouth Two Times A Day As Needed 5)  Super Probiotic   Caps (Misc Intestinal Flora Regulat) .... Prn 6)  Vitamin D 1000 Unit Tabs (Cholecalciferol) .... Take 1 Tablet By Mouth Once A Day 7)  Claritin 10 Mg Tabs (Loratadine) .... Take 1 Tablet By Mouth Once A Day 8)  Vitamin C 500 Mg Tabs (Ascorbic Acid) .... Once Daily 9)  Vagifem 25 Mcg Tabs (Estradiol) .... 2 Tabs Weekly 10)  Mupirocin 2 % Oint (Mupirocin) .... Apply To Right Nostril  Two Times A Day As Needed 11)  Decongestant 30 Mg Tabs (Pseudoephedrine Hcl) .... As Needed 12)  Protonix 40 Mg Tbec (Pantoprazole Sodium) .Marland Kitchen.. 1 By Mouth Once Daily 13)  Alprazolam 0.25 Mg Tabs (Alprazolam) .Marland Kitchen.. 1 or 2 By Mouth Q 6 As Needed  Anxiety  Allergies (verified): 1)  ! Sulfa 2)  ! Augmentin 3)  ! Cipro 4)  ! Benzonatate (Benzonatate) PMH-FH-SH reviewed-no changes except otherwise noted  Review of Systems  The patient denies anorexia, fever, weight loss, weight gain, hoarseness, chest pain, dyspnea on exertion, prolonged cough, and headaches.    Physical Exam  General:  Slender WNWD white female Head:  no tenderness to percussion over frontal or maxillary sinus Eyes:  C&S clear Ears:  tube in right TM which otherwise appears normal; left TM normal Mouth:  posterior pharynx normal without erythema or exudate, no tonsilar enlargement Neck:  supple.   Lungs:  normal respiratory effort, normal breath sounds, no crackles, and no wheezes.   Heart:  normal rate and regular rhythm.     Impression & Recommendations:  Problem # 1:  BRONCHIECTASIS (ICD-494.0) Physical exam is normal.  CXR is clear. WBC 7.2 with normal differential. May be all symptoms of allergy  Plan - continue restarting flonase           use decongestant as needed.           continue all meds           Orders: T-2 View CXR (71020TC) TLB-CBC  Platelet - w/Differential (85025-CBCD)  Complete Medication List: 1)  Multivitamins Tabs (Multiple vitamin) .... Take 1 by mouth qd 2)  Calcium 600 1500 Mg Tabs (Calcium carbonate) .... Take 2 by mouth qd 3)  Flonase 50 Mcg/act Susp (Fluticasone propionate) .... Two sprays each nostril once daily 4)  Mucinex Dm 30-600 Mg Tb12 (Dextromethorphan-guaifenesin) .... Take 1 tablet by mouth two times a day as needed 5)  Super Probiotic Caps (Misc intestinal flora regulat) .... Prn 6)  Vitamin D 1000 Unit Tabs (Cholecalciferol) .... Take 1 tablet by mouth once a day 7)  Claritin 10 Mg Tabs (Loratadine) .... Take 1 tablet by mouth once a day 8)  Vitamin C 500 Mg Tabs (Ascorbic acid) .... Once daily 9)  Vagifem 25 Mcg Tabs (Estradiol) .... 2 tabs weekly 10)  Mupirocin 2 % Oint (Mupirocin) .... Apply to right  nostril  two times a day as needed 11)  Decongestant 30 Mg Tabs (Pseudoephedrine hcl) .... As needed 12)  Protonix 40 Mg Tbec (Pantoprazole sodium) .Marland Kitchen.. 1 by mouth once daily 13)  Alprazolam 0.25 Mg Tabs (Alprazolam) .Marland Kitchen.. 1 or 2 by mouth q 6 as needed anxiety   Immunization History:  Influenza Immunization History:    Influenza:  historical (03/02/2009)

## 2010-06-07 NOTE — Assessment & Plan Note (Signed)
Summary: Pulmonary OV   Primary Provider/Referring Provider:  Dr. Debby Bud  CC:  Follow up.  Pt states breathing is not a problem.  Still tired, having HA, mild chest discomfort, and "scratchy throat."  Occ dry cough.  Has not had CT Chest yet.Marland Kitchen  History of Present Illness: This is a 65 year old, white female with bronchiectasis in the right middle lobe, chronic sinusitis and chronic rhinitis.      November 02, 2009 10:58 AM Pt seen several times in June 2011 with cough, fever, lethargy.  Sore throat.  Initially rx omniceph but not improved so switched on 6/13 to 10days avelox which she has completed. SHe is betternow but CXR abn and so comes in today for review and CT Chest. Had fever 100.5 but now no fever.  Only notes a sl tickle in the throat.   Had bad heachache with fever now HA is gone. Pt denies any significant sore throat, nasal congestion or excess secretions, fever, chills, sweats, unintended weight loss, pleurtic or exertional chest pain, orthopnea PND, or leg swelling Pt denies any increase in rescue therapy over baseline, denies waking up needing it or having any early am or nocturnal exacerbations of coughing/wheezing/or dyspnea. Pt also denies any obvious fluctuation in symptoms wiht weather or environmental change or other alleviating or aggravating factors Pt about to travel to [place] .  Preventive Screening-Counseling & Management  Alcohol-Tobacco     Smoking Status: never  Current Medications (verified): 1)  Multivitamins   Tabs (Multiple Vitamin) .... Take 1 By Mouth Qd 2)  Calcium 600 1500 Mg  Tabs (Calcium Carbonate) .... Take 2 By Mouth Qd 3)  Flonase 50 Mcg/act Susp (Fluticasone Propionate) .... Two Sprays Each Nostril Once Daily 4)  Mucinex Dm 30-600 Mg  Tb12 (Dextromethorphan-Guaifenesin) .... Take 1 Tablet By Mouth Two Times A Day As Needed 5)  Super Probiotic   Caps (Misc Intestinal Flora Regulat) .... Once Daily 6)  Vitamin D 1000 Unit Tabs (Cholecalciferol) ....  Take 1 Tablet By Mouth Once A Day 7)  Claritin 10 Mg Tabs (Loratadine) .... Take 1 Tablet By Mouth Once A Day 8)  Vitamin C 500 Mg Tabs (Ascorbic Acid) .... Once Daily 9)  Vagifem 25 Mcg Tabs (Estradiol) .... 2 Tabs Weekly 10)  Mupirocin 2 % Oint (Mupirocin) .... Apply To Right Nostril  Two Times A Day As Needed 11)  Decongestant 30 Mg Tabs (Pseudoephedrine Hcl) .... As Needed 12)  Protonix 40 Mg Tbec (Pantoprazole Sodium) .Marland Kitchen.. 1 By Mouth Once Daily 13)  Alprazolam 0.25 Mg Tabs (Alprazolam) .Marland Kitchen.. 1 or 2 By Mouth Q 6 As Needed Anxiety  Allergies (verified): 1)  ! Sulfa 2)  ! Augmentin 3)  ! Cipro 4)  ! Benzonatate (Benzonatate)  Past History:  Past medical, surgical, family and social histories (including risk factors) reviewed, and no changes noted (except as noted below).  Past Medical History: Reviewed history from 01/29/2008 and no changes required. 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 Tympanostomy (R) Ear Sinus Surgery (2001)  Family History: Reviewed history from 12/27/2008 and no changes required. Mother deceased 9, rectal CA.  Hx of seizures. Father deceased 25, cardiomyopathy. MGF pancreatic or gastric CA.   Brother '48, arteriovenous malformations, o/w healthy Sister '  60, healthy Multiple uncles (paternal) with cancer.  Social History: Reviewed history from 12/27/2008 and no changes required. 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   Patient never smoked.  EtOH: 2/week.   No other drug use.    Review of Systems       The  patient complains of non-productive cough.  The patient denies shortness of breath with activity, shortness of breath at rest, productive cough, coughing up blood, chest pain, irregular heartbeats, acid heartburn, indigestion, loss of appetite, weight change, abdominal pain, difficulty swallowing, sore throat, tooth/dental problems, headaches, nasal congestion/difficulty breathing through nose, sneezing, itching, ear ache, anxiety, depression, hand/feet swelling, joint stiffness or pain, rash, change in color of mucus, and fever.    Vital Signs:  Patient profile:   65 year old female Height:      62 inches Weight:      104 pounds BMI:     19.09 O2 Sat:      98 % on Room air Temp:     98.2 degrees F oral Pulse rate:   117 / minute BP sitting:   104 / 76  (left arm) Cuff size:   regular  Vitals Entered By: Gweneth Dimitri RN (November 02, 2009 10:51 AM)  O2 Flow:  Room air CC: Follow up.  Pt states breathing is not a problem.  Still tired, having HA, mild chest discomfort, "scratchy throat."  Occ dry cough.  Has not had CT Chest yet. Comments Medications reviewed with patient Daytime contact number verified with patient. Gweneth Dimitri RN  November 02, 2009 10:52 AM    Physical Exam  Additional Exam:  Gen: Pleasant, well-nourished, in no distress , normal affect ZOX:WRUE nasal mucosal erythema, no purulence Neck: No JVD, no TMG, no carotid bruits Lungs: No use of accessory muscles, no dullness to percussion, clear without rales or rhonchi, chest totally clear Cardiovascular: RRR, heart sounds normal, no murmurs or gallops, no peripheral edema Abdomen: soft and non-tender, no HSM, BS normal Musculoskeletal: No deformities, no cyanosis or clubbing Neuro: alert, non-focal     CT of Chest  Procedure date:  11/02/2009  Findings:      IMPRESSION:   Scattered areas of nodularity, peribronchovascular nodularity, peribronchovascular ground-glass, bronchiectasis and mucoid impaction.  These  findings can be seen with a chronic infectious process, such as Mycobacterium avium complex (MAC).  Impression & Recommendations:  Problem # 1:  BRONCHIECTASIS (ICD-494.0) Assessment Improved Recent likely sinusitis and associated bronchiectasis flare with mucus plugging.  Now on CT Chest residual mucus impaction in bronchiectatic airways bilateral L>R.  No evidence for active infection. 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 plan no further ABX follow serial CXR over time ok for pt to travel with 10day supply (I gave pt three more sample boxes of avelox) should she flare in [place]  rov as needed   Medications Added to Medication List This Visit: 1)  Super Probiotic Caps (Misc intestinal flora regulat) .... Once daily  Complete Medication List: 1)  Multivitamins Tabs (Multiple vitamin) .... Take 1 by mouth qd 2)  Calcium 600 1500 Mg Tabs (Calcium carbonate) .... Take 2 by mouth qd 3)  Flonase 50 Mcg/act Susp (Fluticasone propionate) .... Two sprays each nostril once daily 4)  Mucinex Dm 30-600 Mg Tb12 (Dextromethorphan-guaifenesin) .... Take 1 tablet by mouth two times a day as needed 5)  Super Probiotic Caps (Misc intestinal flora regulat) .... Once daily 6)  Vitamin D 1000 Unit Tabs (Cholecalciferol) .... Take 1 tablet by mouth once a day 7)  Claritin 10 Mg Tabs (Loratadine) .... Take 1 tablet by mouth once a day 8)  Vitamin C 500 Mg Tabs (Ascorbic acid) .... Once daily 9)  Vagifem 25 Mcg Tabs (Estradiol) .... 2 tabs weekly 10)  Mupirocin 2 % Oint (Mupirocin) .... Apply to right nostril  two times a day as needed 11)  Decongestant 30 Mg Tabs (Pseudoephedrine hcl) .... As needed 12)  Protonix 40 Mg Tbec (Pantoprazole sodium) .Marland Kitchen.. 1 by mouth once daily 13)  Alprazolam 0.25 Mg Tabs (Alprazolam) .Marland Kitchen.. 1 or 2 by mouth q 6 as needed anxiety  Other Orders: Radiology Referral (Radiology) Est. Patient Level IV  (60454)  Patient Instructions: 1)  Take avelox with you on trip,  no active infection on CT  2)  I will call later with radiology overread report 3)  No other medication changes 4)  Have a good trip

## 2010-06-07 NOTE — Progress Notes (Signed)
Summary: Still SICK  Phone Note Call from Patient   Summary of Call: [name] made pt nauseous and dizzy. She is c/o fatigue and continued "bronchial" cough. Overall feels bad. Do you want to see pt in office again?  Initial call taken by: Lamar Sprinkles, CMA,  October 20, 2009 8:39 AM  Follow-up for Phone Call        I have struck out twice! If Dr. Delford Field is in - perhaps he could see her. If not I can this PM.  Follow-up by: Jacques Navy MD,  October 20, 2009 9:18 AM  Additional Follow-up for Phone Call Additional follow up Details #1::        Dr Delford Field is out of office, scheduled for this PM Additional Follow-up by: Lamar Sprinkles, CMA,  October 20, 2009 10:50 AM

## 2010-06-07 NOTE — Assessment & Plan Note (Signed)
Summary: flu shot/jd   Nurse Visit   Allergies: 1)  ! Sulfa 2)  ! Augmentin 3)  ! Cipro 4)  ! Benzonatate (Benzonatate)  Orders Added: 1)  Admin 1st Vaccine [90471] 2)  Flu Vaccine 82yrs + [56433] Flu Vaccine Consent Questions     Do you have a history of severe allergic reactions to this vaccine? no    Any prior history of allergic reactions to egg and/or gelatin? no    Do you have a sensitivity to the preservative Thimersol? no    Do you have a past history of Guillan-Barre Syndrome? no    Do you currently have an acute febrile illness? no    Have you ever had a severe reaction to latex? no    Vaccine information given and explained to patient? yes    Are you currently pregnant? no    Lot Number:[id number]   Exp Date:11/03/2010   Site Given  Left Deltoid IM Tammy Scott  February 06, 2010 10:24 AM

## 2010-06-07 NOTE — Assessment & Plan Note (Signed)
Summary: Pulmonary OV   Primary Provider/Referring Provider:  Dr. Debby Bud  CC:  8 month follow up.  Pt states breathing is doing well overall.  Denies SOB, wheezing, chest tightness, and cough.  No complaints. .  History of Present Illness: This is a 65 year old, white female with bronchiectasis in the right middle lobe, chronic sinusitis and chronic rhinitis.      Sep 22, 2009 12:02 PM Pt doing ok since 9/10. Had an issue 12/10 other wise ok May is an allergy issue No pndrip.   Gets headaches with allergies.  Flu like illness with allergy No mucous to cough up.  No chest pain.  No f/c/s.   Preventive Screening-Counseling & Management  Alcohol-Tobacco     Smoking Status: never  Current Medications (verified): 1)  Multivitamins   Tabs (Multiple Vitamin) .... Take 1 By Mouth Qd 2)  Calcium 600 1500 Mg  Tabs (Calcium Carbonate) .... Take 2 By Mouth Qd 3)  Flonase 50 Mcg/act Susp (Fluticasone Propionate) .... Two Sprays Each Nostril Once Daily 4)  Mucinex Dm 30-600 Mg  Tb12 (Dextromethorphan-Guaifenesin) .... Take 1 Tablet By Mouth Two Times A Day As Needed 5)  Super Probiotic   Caps (Misc Intestinal Flora Regulat) .... Prn 6)  Vitamin D 1000 Unit Tabs (Cholecalciferol) .... Take 1 Tablet By Mouth Once A Day 7)  Claritin 10 Mg Tabs (Loratadine) .... Take 1 Tablet By Mouth Once A Day 8)  Vitamin C 500 Mg Tabs (Ascorbic Acid) .... Once Daily 9)  Vagifem 25 Mcg Tabs (Estradiol) .... 2 Tabs Weekly 10)  Zantac 150 Mg Tabs (Ranitidine Hcl) .Marland Kitchen.. 1 Every Other Day 11)  Mupirocin 2 % Oint (Mupirocin) .... Apply To Right Nostril  Two Times A Day As Needed 12)  Decongestant 30 Mg Tabs (Pseudoephedrine Hcl) .... As Needed  Allergies (verified): 1)  ! Sulfa 2)  ! Augmentin 3)  ! Cipro  Past History:  Past medical, surgical, family and social histories (including risk factors) reviewed, and no changes noted (except as noted below).  Past Medical History: Reviewed history from 01/29/2008  and no changes required. 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 Tympanostomy (R) Ear Sinus Surgery (2001)  Family History: Reviewed history from 12/27/2008 and no changes required. Mother deceased 41, rectal CA.  Hx of seizures. Father deceased 42, cardiomyopathy. MGF pancreatic or gastric CA.   Brother '48, arteriovenous malformations, o/w healthy Sister '60, healthy Multiple uncles (paternal) with cancer.  Social History: Reviewed history from 12/27/2008 and no changes required. 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   Patient never smoked.  EtOH: 2/week.   No other drug use.    Review of Systems  The patient denies shortness of breath with activity, shortness of breath at rest, productive cough, non-productive cough, coughing up blood, chest pain, irregular heartbeats, acid heartburn, indigestion, loss of appetite, weight change, abdominal pain, difficulty swallowing, sore throat, tooth/dental problems, headaches, nasal congestion/difficulty breathing through nose, sneezing, itching, ear ache, anxiety, depression, hand/feet swelling, joint stiffness or pain, rash, change in color of mucus, and fever.    Vital Signs:  Patient profile:   65 year old female Height:      62 inches Weight:      107 pounds  BMI:     19.64 O2 Sat:      100 % on Room air Temp:     97.7. degrees F oral Pulse rate:   83 / minute BP sitting:   110 / 76  (right arm) Cuff size:   regular  Vitals Entered By: Gweneth Dimitri RN (Sep 22, 2009 11:55 AM)  O2 Flow:  Room  air CC: 8 month follow up.  Pt states breathing is doing well overall.  Denies SOB, wheezing, chest tightness, cough.  No complaints.  Comments Medications reviewed with patient Daytime contact number verified with patient. Gweneth Dimitri RN  Sep 22, 2009 12:00 PM    Physical Exam  Additional Exam:  Gen: Pleasant, well-nourished, in no distress , normal affect ENT: R> L nasal inflammation and erythema,  no purulence Neck: No JVD, no TMG, no carotid bruits Lungs: No use of accessory muscles, no dullness to percussion, clear without rales or rhonchi Cardiovascular: RRR, heart sounds normal, no murmurs or gallops, no peripheral edema Abdomen: soft and non-tender, no HSM, BS normal Musculoskeletal: No deformities, no cyanosis or clubbing Neuro: alert, non-focal     Impression & Recommendations:  Problem # 1:  BRONCHIECTASIS (ICD-494.0) Assessment Improved  Bronchiectasis stable at this time plan no change in plan of care   Problem # 2:  ALLERGIC RHINITIS, SEASONAL (ICD-477.0) Assessment: Unchanged seasonal rhinitis, no true sinusitis now but at high risk for develpment plan cont nasal hygiene avelox rx given incase of need  Orders: Est. Patient Level III (91478)  Medications Added to Medication List This Visit: 1)  Vitamin D 1000 Unit Tabs (Cholecalciferol) .... Take 1 tablet by mouth once a day 2)  Zantac 150 Mg Tabs (Ranitidine hcl) .Marland Kitchen.. 1 every other day 3)  Mupirocin 2 % Oint (Mupirocin) .... Apply to right nostril  two times a day as needed 4)  Decongestant 30 Mg Tabs (Pseudoephedrine hcl) .... As needed 5)  Avelox 400 Mg Tabs (Moxifloxacin hcl) .... By mouth daily  Complete Medication List: 1)  Multivitamins Tabs (Multiple vitamin) .... Take 1 by mouth qd 2)  Calcium 600 1500 Mg Tabs (Calcium carbonate) .... Take 2 by mouth qd 3)  Flonase 50 Mcg/act Susp (Fluticasone propionate) .... Two sprays each nostril once daily 4)  Mucinex Dm 30-600 Mg Tb12  (Dextromethorphan-guaifenesin) .... Take 1 tablet by mouth two times a day as needed 5)  Super Probiotic Caps (Misc intestinal flora regulat) .... Prn 6)  Vitamin D 1000 Unit Tabs (Cholecalciferol) .... Take 1 tablet by mouth once a day 7)  Claritin 10 Mg Tabs (Loratadine) .... Take 1 tablet by mouth once a day 8)  Vitamin C 500 Mg Tabs (Ascorbic acid) .... Once daily 9)  Vagifem 25 Mcg Tabs (Estradiol) .... 2 tabs weekly 10)  Zantac 150 Mg Tabs (Ranitidine hcl) .Marland Kitchen.. 1 every other day 11)  Mupirocin 2 % Oint (Mupirocin) .... Apply to right nostril  two times a day as needed 12)  Decongestant 30 Mg Tabs (Pseudoephedrine hcl) .... As needed 13)  Avelox 400 Mg Tabs (Moxifloxacin hcl) .... By mouth daily  Patient Instructions: 1)  Only if you worsen with cough, sinus pressure, nasal drainage, would you fill Avelox and take one daily for 5 days 2)  Otherwise ,  no change in medications 3)  Use flonase daily and nedipot twice daily, work on the R sinus especially 4)  Return 6 months or as needed Prescriptions: AVELOX 400 MG  TABS (MOXIFLOXACIN HCL) By mouth  daily  #5 x 0   Entered and Authorized by:   Storm Frisk MD   Signed by:   Storm Frisk MD on 09/22/2009   Method used:   Print then Give to Patient   RxID:   [id number]

## 2010-06-12 ENCOUNTER — Ambulatory Visit (INDEPENDENT_AMBULATORY_CARE_PROVIDER_SITE_OTHER): Payer: Medicare Other | Admitting: Internal Medicine

## 2010-06-12 ENCOUNTER — Encounter: Payer: Self-pay | Admitting: Internal Medicine

## 2010-06-12 DIAGNOSIS — R1084 Generalized abdominal pain: Secondary | ICD-10-CM

## 2010-06-21 NOTE — Assessment & Plan Note (Signed)
Summary: ABD PAIN 2 WKS/NWS   Vital Signs:  Patient profile:   65 year old female Height:      62 inches Weight:      104 pounds BMI:     19.09 O2 Sat:      96 % on Room air Temp:     98.0 degrees F oral Pulse rate:   87 / minute BP sitting:   112 / 80  (left arm) Cuff size:   regular  Vitals Entered By: Bill Salinas CMA (June 12, 2010 3:49 PM)  O2 Flow:  Room air CC: pt here with c/o abd pain x 2 weeks. she also is exp nausea, bloating, discomfort. Pt denies diarrhea, blood in stool and states she is having normal bms/ ab   Primary Care Provider:  Dr. Debby Bud  CC:  pt here with c/o abd pain x 2 weeks. she also is exp nausea, bloating, discomfort. Pt denies diarrhea, and blood in stool and states she is having normal bms/ ab.  History of Present Illness: Monica [name] presents with a 10-14 day history of lower abdominal discomfort that is constant, mild ot moderate with no acute pain. She has had no fever. Bowel habit is unchanged and stools are normal in color, there is no blood or mucus, texture may be somewhat softer but movements are unlabored. She has had no upper GI symptoms. She has seen her gynecologist and there is no adnexal cause or other gyn problem.  Current Medications (verified): 1)  Multivitamins   Tabs (Multiple Vitamin) .... Take 1 By Mouth Qd 2)  Calcium 600 1500 Mg  Tabs (Calcium Carbonate) .... Take 2 By Mouth Qd 3)  Flonase 50 Mcg/act Susp (Fluticasone Propionate) .... Two Sprays Each Nostril Once Daily 4)  Mucinex Dm 30-600 Mg  Tb12 (Dextromethorphan-Guaifenesin) .... Take 1 Tablet By Mouth Two Times A Day As Needed 5)  Super Probiotic   Caps (Misc Intestinal Flora Regulat) .... Once Daily 6)  Vitamin D 1000 Unit Tabs (Cholecalciferol) .... Take 1 Tablet By Mouth Once A Day 7)  Claritin 10 Mg Tabs (Loratadine) .... Take 1 Tablet By Mouth Once A Day 8)  Vitamin C 500 Mg Tabs (Ascorbic Acid) .... Once Daily 9)  Vagifem 25 Mcg Tabs (Estradiol) .... 2 Tabs  Weekly 10)  Mupirocin 2 % Oint (Mupirocin) .... Apply To Right Nostril  Two Times A Day As Needed 11)  Decongestant 30 Mg Tabs (Pseudoephedrine Hcl) .... As Needed 12)  Protonix 40 Mg Tbec (Pantoprazole Sodium) .Marland Kitchen.. 1 By Mouth Once Daily 13)  Alprazolam 0.25 Mg Tabs (Alprazolam) .Marland Kitchen.. 1 or 2 By Mouth Q 6 As Needed Anxiety  Allergies (verified): 1)  ! Sulfa 2)  ! Augmentin 3)  ! Cipro 4)  ! 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  The patient denies anorexia, fever, weight loss, dyspnea on exertion, melena, hematochezia, severe indigestion/heartburn, incontinence, abnormal bleeding, and enlarged lymph nodes.   GI:  Complains of abdominal pain and gas; denies bloody stools,  change in bowel habits, constipation, dark tarry stools, diarrhea, excessive appetite, hemorrhoids, indigestion, loss of appetite, nausea, vomiting, and yellowish skin color.  Physical Exam  General:  Well-developed,well-nourished,in no acute distress; alert,appropriate and cooperative throughout examination Eyes:  C&S clear Neck:  supple.   Lungs:  normal respiratory effort and normal breath sounds.   Heart:  normal rate and regular rhythm.   Abdomen:  soft, normal bowel sounds, no distention, no masses, no guarding, no rebound tenderness, and no  hepatomegaly.  Mild tenderness that is diffuse across the lower quadrants with no acute tenderness to deep palpation.  Msk:  normal ROM.   Pulses:  2+ radial Neurologic:  alert & oriented X3, cranial nerves II-XII intact, and gait normal.   Skin:  turgor normal, color normal, and no rashes.   Psych:  normally interactive, good eye contact, and not anxious appearing.     Impression & Recommendations:  Problem # 1:  ABDOMINAL PAIN, GENERALIZED (ICD-789.07) Mild to moderate abdominal pain that has been persistent with no signs or symptoms to sugges infection, i.e. diverticulitis. No evidence of GI bleed. She is current with colorectal cancer screening with last study June '10 - internal hemorrhoids only finding.  Plan - conservative therapy with  daily probiotic - align - and bulk laxative of choice.           for persistent discomfort - lab with LFTs, stool cards for blood and possible GI consult.   Complete Medication List: 1)  Multivitamins Tabs (Multiple vitamin) .... Take 1 by mouth qd 2)  Calcium 600 1500 Mg Tabs (Calcium carbonate) .... Take 2 by mouth qd 3)  Flonase 50 Mcg/act Susp (Fluticasone propionate) .... Two sprays each nostril once daily 4)  Mucinex Dm 30-600 Mg Tb12 (Dextromethorphan-guaifenesin) .... Take 1 tablet by mouth two times a day as needed 5)  Super Probiotic Caps (Misc intestinal flora regulat) .... Once daily 6)  Vitamin D 1000 Unit Tabs (Cholecalciferol) .... Take 1 tablet by mouth once a day 7)  Claritin 10 Mg Tabs (Loratadine) .... Take 1 tablet by mouth once a day 8)  Vitamin C 500 Mg Tabs (Ascorbic acid) .... Once daily 9)  Vagifem 25 Mcg Tabs (Estradiol) .... 2 tabs weekly 10)  Mupirocin 2 % Oint (Mupirocin) .... Apply to right nostril  two times a day as needed 11)  Decongestant 30 Mg Tabs (Pseudoephedrine hcl) .... As needed 12)  Protonix 40 Mg Tbec (Pantoprazole sodium) .Marland Kitchen.. 1 by mouth once daily 13)  Alprazolam 0.25 Mg Tabs (Alprazolam) .Marland Kitchen.. 1 or 2 by  mouth q 6 as needed anxiety   Orders Added: 1)  Est. Patient Level III [16109]

## 2010-06-22 ENCOUNTER — Telehealth (INDEPENDENT_AMBULATORY_CARE_PROVIDER_SITE_OTHER): Payer: Self-pay | Admitting: *Deleted

## 2010-06-25 ENCOUNTER — Encounter: Payer: Self-pay | Admitting: Internal Medicine

## 2010-07-03 NOTE — Progress Notes (Signed)
Summary: PA-Pantoprazole  Phone Note From Pharmacy   Summary of Call: PA-Pantoprazole called [employer] @ [phone]. awaiting form. Dagoberto Reef  June 22, 2010 1:10 PM Form to Dr Debby Bud to complete. Dagoberto Reef  June 22, 2010 2:27 PM Faxed to [phone], awaiting approval. Dagoberto Reef  June 25, 2010 2:56 PM PA Approved 06/25/10 - [id number], pt aware. Initial call taken by: Dagoberto Reef,  June 26, 2010 3:20 PM

## 2010-07-03 NOTE — Medication Information (Signed)
Summary: Prior Authorization for [employer] / [facility]  Prior Authorization for [employer] / [facility]   Imported By: Lennie Odor 06/27/2010 12:28:49  _____________________________________________________________________  External Attachment:    Type:   Image     Comment:   External Document

## 2010-09-18 NOTE — Assessment & Plan Note (Signed)
[facility]                             PULMONARY OFFICE NOTE   NAME:[name], Monica NAPLES                      MRN:          [id number]  DATE:12/26/2006                            DOB:          1945/12/08    Monica [name] is seen in followup. She is a 65 year old white female with a  history of reflux disease, chronic right middle lobe cylindrical  bronchiectasis, recent recurrent sinusitis involving right-sided  deviated septum. The patient is improved with decreased cough, decreased  nasal congestion and decreased shortness of breath. The patient  maintains off all inhaled medications.   PHYSICAL EXAMINATION:  Temperature 97.0, blood pressure 116/74, pulse  76, saturation is 99% room air.  CHEST: Showed to be clear without evidence of wheeze or rhonchi.  CARDIAC: Showed a regular rate and rhythm without S3. Normal S1, S2.  ABDOMEN: Soft and nontender.  EXTREMITIES: Showed no edema or clubbing.  SKIN: Was clear.   IMPRESSION:  Is that of chronic rhinitis, cylindrical bronchiectasis,  both of which are stable. No evidence of active sinusitis.   PLAN:  Is for the patient to maintain off inhaled medications and follow  the patient expectantly. She will use saline wash as needed.     Charlcie Cradle Delford Field, MD, [facility]  Electronically Signed    PEW/MedQ  DD: 12/26/2006  DT: 12/27/2006  Job #: [id number]   cc:   Rosalyn Gess. Norins, MD

## 2010-09-18 NOTE — Assessment & Plan Note (Signed)
[facility]                                 ON-CALL NOTE   Monica [name], Monica [name]                        MRN:          [id number]  DATE:02/15/2007                            DOB:          1946-02-27    REGULAR DOCTOR:  Dr. Debby Bud.   Phone#:  [phone]   SUBJECTIVE:  Monica [name] has received a shingles vaccine on Thursday.  Over the past few days she has had a little redness at the site and is  now having some pain in her axilla.  She denies fever, chills, shortness  of breath, difficulty swallowing.   ASSESSMENT AND PLAN:  Allergic reaction at injection site versus  cellulitis.  She will keep an eye on the area of redness for spreading.  She will apply antibiotic ointment.  She will use over-the-counter  antihistamine, non-drowsy, during the day and Benadryl at night.  If her  symptoms continue she will follow up with her primary care doctor.     Kerby Nora, MD  Electronically Signed    AB/MedQ  DD: 02/15/2007  DT: 02/15/2007  Job #: [id number]

## 2010-09-18 NOTE — Assessment & Plan Note (Signed)
[facility]                           PRIMARY CARE OFFICE NOTE   NAME:Monica [name], Monica [name]                      MRN:          [id number]  DATE:12/02/2006                            DOB:          12-13-1945    Monica [name] is a very pleasant 65 year old woman who returns today for  followup examination and exam. She was last seen by my self December 26, 2005 for back ache and headache. In the interval the patient has been  seen on multiple occasions by Dr. Shan Levans who follows her for  cylindrical bronchiectasis right middle lobe which has been stable.   CHIEF COMPLAINT:  1. Upper respiratory infection. Patient reports she had an episode of      sinus infection in February of 2008, which responded to Avelox.      Patient has had a recent diagnosis of a recurrent infection and was      started again on Avelox November 28, 2006. These 2 episodes have raised      her concern for recurrent problems with sinus infection given her      past medical history of sinus surgery.  2. Osteoporosis. Patient completed 2 years of Forteo therapy and is      currently taking Boniva. She has had her vitamin D level checked,      and was vitamin D deficient at 21.5, and did take 50,000 units of      vitamin D twice a week for 6 months, and currently is now taking      800 international units daily. She has been followed by Dr. Floyde Parkins for this problem and gets her DEXA scans at [facility].   PAST MEDICAL HISTORY:   SURGICAL:  1. X-ray cyst excised in 1975.  2. Bilateral anterior ethmoidectomies in 2001.   MEDICAL ILLNESS:  1. Usual childhood disease.  2. Chronic sinus infections improved with surgery.  3. History of urticaria.  4. Post menopausal with a 5 year history of hormone replacement      therapy, currently not on hormone therapy except for vaginal cream.  5. Cardiac palpitations with a negative cardiac workup.  6. TMJ.  7. History of  hearing loss on the left, status post tympanostomy tube      on the right.  8. Cylindrical bronchiectasis right middle lobe stable.  9. History of hemorrhoids with hematochezia.  10.History of mild insomnia.  11.Osteoporosis profound.   CURRENT MEDICATIONS:  1. Multivitamin daily.  2. Calcium 600 mg 2 tablets daily.  3. Vitamin C 500 mg daily.  4. Boniva 150 mg monthly.  5. Vitamin D 800 international units daily.  6. Vagifem vaginally twice a week.  7. Protonix 40 mg q.a.m. p.r.n.  8. Flonase p.r.n.  9. Sudafed p.r.n.  10.Claritin p.r.n.  11.Afrin p.r.n.  12.Mucinex p.r.n.   PHYSICIAN ROSTER:  Gynecology, Dr. Rosalio Macadamia. ENT, Dr. Dorma Russell.  Cardiology, Dr. Graciela Husbands. Allergy, Dr. Lucie Leather. Orthopedics, Dr. Thurston Hole.  Urology, Dr. Vernie Ammons.   CHART REVIEW:  Last colonoscopy November 02, 2003 with  colon polyps, patient  will have follow up in 2009.   Electrophysiology consultation in 1999.   Last 2D echo 1999 which was normal study with an ejection fraction of  60%.   Bronchoscopy performed April 08, 2002 by Dr. Danise Mina. Last note  from [facility] dating from January 27, 2006.   Last CT scan of the sinus July 09, 2006 which showed clear paranasal  sinuses. Last chest x-ray June 19, 2006 which showed no active  pulmonary disease. Last mammogram report February 13, 2006 which was read  out as a negative study. Last event recorder dates from September of  2005 which showed normal sinus rhythm with frequent PVCs and couplets.   FAMILY HISTORY:  Father had history of hypertension. Mother died at age  37 of cancer of the rectum. Maternal grandmother had pancreatic cancer.  She also had congestive heart failure. There is no history of colon  cancer or breast cancer.   SOCIAL HISTORY:  The patient is married 34 years. 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 Her husband is an  executive in [employer] who has gone through a  significant  business transition. The patient and her husband love to travel and do  have a 12 day trip planned or [place] in late August. She reports her  marriage is in good health.   REVIEW OF SYSTEMS:  No fevers, sweats, or chills, chronic insomnia is  noted. __________ patient had last exam July of 2008. No ENT complaints  at this time except for sinus infection as noted. Patient continues to  have palpitations but these are not sustained nor symptomatic.  Respiratory, patient is doing well recently evaluated by Dr. Shan Levans and has no wheezing or shortness of breath. No GI or GU  complaints. Patient does have ongoing knee pain with a history of torn  meniscus on the left. She takes occasional OTC-NSAIDs. No dermatologic  or neurologic problems.   LIMITED EXAMINATION:  Temperature was 97.7, blood pressure 121/74, pulse  94, weight 102.  GENERAL APPEARANCE: This is a well-nourished woman looking younger than  her stated chronologic age in no acute distress.  HEENT EXAM: Normocephalic, atraumatic. Minimal tenderness to percussion  over her frontal sinuses. EACs and TM s were unremarkable. Nostrils were  examined and were clear with mild erythema of the turbinates. There is  some mucous obstruction on the right, left nostril was clear to the  posterior pharynx. Oropharynx revealed native dentition in good repair.  No buccal or palate lesions were noted. Posterior pharynx was clear.  Conjunctivae and sclera was clear. Pupils equal, round, and reactive.  Further exam deferred to ophthalmology.  NECK: Supple. There was no thyromegaly.  NODES: No adenopathy was noted in the submandibular, cervical,  supraclavicular regions.  CHEST: No deformities are noted. No CVA tenderness is noted.  LUNGS: Clear with no rales, wheezes, or rhonchi.  CARDIOVASCULAR: 2 + radial pulse. No JVD or carotid bruits. She had a  quiet precordium with a regular rate and rhythm. No murmurs, rubs, or   gallops were appreciated.  BREAST EXAM: Deferred to gynecology.  ABDOMEN: Soft, no guarding or rebound. No organosplenomegaly was noted.  PELVIC AND RECTAL EXAMS: Deferred to gynecology.  EXTREMITIES: Without clubbing, cyanosis, or edema. No deformities are  noted.  NEUROLOGIC EXAM: Nonfocal.  DERM: Patient had no obvious lesions on head, face, neck, upper back.   DATA BASE:  A 12-lead electrocardiogram revealed  a normal sinus rhythm  and it was a normal EKG.   LABORATORY:  Hemoglobin 13.2 grams, white count was 6,000 with a normal  differential. Chemistries were normal, glucose was 99, kidney function  normal with a creatinine of 0.7, and estimated GFR of 90 milliliters per  minute. Liver functions were normal. Cholesterol was 179, triglycerides  65, HDL 51.8, LDL was 114. Thyroid function normal with a TSH of 4.06,  urinalysis was negative.   ASSESSMENT/PLAN:  1. Sinus infection, patient is completing a course of Avelox. She does      seem to be asymptomatic at today's visit. No further evaluation is      indicated. At this time.  2. Pulmonary, patient with cylindrical bronchiectasis of the right      middle lobe. She is currently stable and followed on a regular      basis by Dr. Shan Levans.  3. Osteoporosis discussed this at length with the patient. She is      currently on Boniva 150 mg monthly. She will be having a DEXA scan      with Dr. Rosalio Macadamia. They did suggest that if she has progressive      bone loss the options would include either addition of a serum      agent to her bisphosphonate agent or consideration of resumption of      Forteo.  4. Hearing loss, stable.  5. Gastrointestinal, patient is current and up to date with colorectal      cancer screening and she has no gastrointestinal complaints.  6. TMJ, patient reports she is having no active problems at this time.  7. Health maintenance, patient is current and up to date with      gynecology, colorectal  cancer screening, cardiovascular evaluation,      and labs.   SUMMARY:  A very pleasant woman who is medically stable with resolving  sinus infection. She is asked to return to see me on a p.r.n. basis.     Rosalyn Gess Norins, MD  Electronically Signed    MEN/MedQ  DD: 12/03/2006  DT: 12/03/2006  Job #: [id number]   cc:   Saintclair Halsted Rosalio Macadamia, M.D.

## 2010-09-18 NOTE — Assessment & Plan Note (Signed)
[facility]                             PULMONARY OFFICE NOTE   NAME:Monica [name], Monica [name]                      MRN:          [id number]  DATE:11/28/2006                            DOB:          10-09-1945    [name] is a 65 year old white female, history of bronchiectasis  right middle lobe, reflux disease.  She complains now of increased head  congestion, dry cough, no fever, no shortness of breath or wheezing.  She is not bringing up mucus but does complain of sinus pressure  symptoms that have been going on for a week.  She had a similar bout of  this in March of this year and did not clear until she received 10 days  of Avelox.   EXAMINATION:  Temperature 97, blood pressure 106/66, pulse 87,  saturation 98% room air.  CHEST:  Showed to be clear without no evidence of adventitious breath  sounds, no wheeze or rhonchi noted.  CARDIAC:  Showed a regular rate and rhythm, without S3, normal S1-S2.  ABDOMEN:  Soft, nontender.  EXTREMITIES:  Showed no edema or clubbing.  SKIN:  Clear.  Nares however revealed increased nasal inflammation, mild purulence left  greater than right nares, oropharynx is clear.  NECK:  Supple, there was no thyromegaly.   IMPRESSION:  Early acute sinusitis in a patient who has documented  negative CT scans of the sinuses in the past with underlying chronic  rhinitis.   PLAN:  The patient to receive Avelox 400 mg a day for 10 days.  She will  continue saline wash twice daily.  She will begin Mucinex 2 twice daily,  increase Flonase to 2 sprays each nostril b.i.d., and we will see the  patient back in return followup in one month.     Charlcie Cradle Delford Field, MD, [facility]  Electronically Signed    PEW/MedQ  DD: 11/28/2006  DT: 11/29/2006  Job #: [id number]

## 2010-09-18 NOTE — Assessment & Plan Note (Signed)
[facility]                             PULMONARY OFFICE NOTE   NAME:HARRISAllye, Hoyos                      MRN:          [id number]  DATE:09/18/2006                            DOB:          June 23, 1945    Ms. Buller is a 65 year old white female with a history of right middle  lobe bronchiectasis on a cylindrical basis. She is having no complaints  now. No mucus production. No cough and no chest congestion.   She maintains Boniva monthly 150 mg and calcium and vitamin C  supplementation.   PHYSICAL EXAMINATION:  Temperature 97.0, blood pressure 114/70, pulse  88, saturation 100% room air.  CHEST: Showed to be completely clear to auscultation and percussion.  There was no evidence of adventitious breath sounds.  CARDIAC: Showed a regular rate and rhythm without S3. Normal S1, S2.  ABDOMEN: Soft, nontender.  EXTREMITIES: Showed no edema or clubbing.  SKIN: Was clear.   IMPRESSION:  Is that of stable bronchiectasis.   PLAN:  Is for the patient to maintain off inhaled medications and will  follow this patient expected.     Charlcie Cradle Delford Field, MD, [facility]  Electronically Signed    PEW/MedQ  DD: 09/18/2006  DT: 09/18/2006  Job #: [id number]   cc:   Rosalyn Gess. Norins, MD

## 2010-09-18 NOTE — Assessment & Plan Note (Signed)
[facility]                           PRIMARY CARE OFFICE NOTE   Monica [name], Monica [name]                      MRN:          [id number]  DATE:12/05/2006                            DOB:          04-Sep-1945    Monica [name] is a 64 year old woman just seen recently for a full  examination. Please see that dictation.   The patient reports that while taking out the trash the lid of the city  collection bin fell on her head, striking her at the vertex skull. She  has no loss of consciousness. She has had a residual headache. She has  had no diplopia, slurred speech, cognitive impairment or other focal  neurologic deficit.   LIMITED EXAM:  Temperature 96.4, blood pressure 114/74, pulse 92, weight  102.  GENERAL APPEARANCE: Is a well-nourished, well-groomed woman. She is in  no acute distress.  GENERAL APPEARANCE: This is a well-nourished, well-developed gentleman  who looks his age who is in no acute distress.  HEENT: She has no sign of trauma. There is no hematoma. There is no  laceration. No point tenderness to palpation of the vertex skull.  NEUROLOGIC: The patient is awake, alert and oriented to person, place,  time and context. Speech is clear. Cognitive function is normal. Cranial  nerves II-XII grossly intact with normal pupil size, normal pupil  reaction. Her examination was otherwise nonfocal. She had no gait  abnormalities.   ASSESSMENT/PLAN:  Headache. The patient is probably with a headache from  minor blunt trauma. Possible she could have a very minor concussion  although she has had no significant neurologic symptoms. Very unlikely  that she would have sustained a subdural hematoma from this injury.   PLAN:  The patient is given samples of Celebrex 200 mg to take one daily  x6 days. She is given warning signs of neurologic deficit or symptoms  and should these develop she is instructed to call the on-call Diego Ulbricht  who can refer her directly  for a CT scan of the brain.   I fully expect the patient to do well and doubt there is significant  underlying serious illness or injury.     Rosalyn Gess Norins, MD  Electronically Signed    MEN/MedQ  DD: 12/05/2006  DT: 12/05/2006  Job #: [id number]

## 2010-09-21 NOTE — Assessment & Plan Note (Signed)
[facility]                             PULMONARY OFFICE NOTE   NAME:[name], Monica BRIGHTBILL                      MRN:          [id number]  DATE:07/08/2006                            DOB:          1945/10/11    Monica [name] is a 65 year old white female, history of chronic cough and  recent sinus infection.  The patient was placed on Avalox after we had  her on Omnicef in February per Dr. Dorma Russell.  She is utilizing nasal washes  since that time.  Her symptoms are better.  She finished her course of  Mucinex DM.  She is on a Protonix daily.  Her other maintenance  medicines are listed on the chart and are corrected as reviewed.  She is  using Flonase as needed.  She is still having some nasal congestion, but  it is overall improved.   PHYSICAL EXAMINATION:  On exam, temperature 97, blood pressure 102/78,  pulse 97, saturation 99% room air.  On exam, the right nares shows mild  nasal inflammation compared to the left nares.  There is significant  improvement though in purulence of the right nares compared to previous  examinations.  CHEST:  Clear bilaterally to auscultation and percussion.  There is no  evidence of wheeze or rhonchi noted.  CARDIAC:  Regular rate and rhythm, without S3, normal S1, S2.  ABDOMEN:  Soft, nontender.  EXTREMITIES:  No edema or clubbing.   IMPRESSION:  She should maintain Protonix daily, Flonase daily, and we  will obtain a CT scan of the sinus to evaluate for chronic sinusitis and  followup.     Charlcie Cradle Delford Field, MD, [facility]  Electronically Signed    PEW/MedQ  DD: 07/08/2006  DT: 07/08/2006  Job #: [id number]   cc:   Carolan Shiver, M.D.  Rosalyn Gess Norins, MD

## 2010-09-21 NOTE — Assessment & Plan Note (Signed)
[facility]                             PULMONARY OFFICE NOTE   NAME:Monica [name], Monica [name]                      MRN:          [id number]  DATE:06/25/2006                            DOB:          02-Jan-1946    The patient is a 65 year old white female, patient of Dr. Lynelle Doctor who  has a known history of bronchiectasis at the right lower lobe presents  with a persistent cough and congestion.  The patient was seen in the  office 1 week ago for an acute asthmatic bronchitic exacerbation. She  had previously been on Zithromax but did not have any improvement in  symptoms.  She was changed over to [facility] for 10 days.  The patient  returns complaining that she has been on 6 out of 10 days of the [facility]  and symptoms have not totally resolved.  The patient complains that she  still has nasal congestion, productive cough.  The patient denies any  hemoptysis, orthopnea, PND or leg swelling.   PAST MEDICAL HISTORY:  Is reviewed.   CURRENT MEDICATIONS:  Is reviewed.   PHYSICAL EXAMINATION:  The patient is a pleasant female in no acute  distress.  She is afebrile.  Stable vital signs.  O2 saturation is 99% on room air.  HEENT:  Nasal mucosa is erythematous, right greater than the left.  Sinus tenderness to percussion.  Posterior pharynx is clear.  NECK:  Is supple with no adenopathy.  LUNGS:  Sounds are clear without any wheezing, crackles.  CARDIAC:  Regular rate.  ABDOMEN:  Soft, nontender.  EXTREMITIES:  Warm without any edema.   IMPRESSION/PLAN:  Acute asthmatic bronchitic exacerbation and probable  sinusitis.  The patient is to finish Omnicef as scheduled.  Add Mucinex  DM twice daily, Flonase and saline nasal rinses.  Patient is to add  Endal HD #8 ounces every 4 to 6 hours as needed for cough.  She may also  use Tessalon pearls as needed for cough control.  Patient is given  Xopenex nebulizer treatment in the office.  The patient will follow up  back  with Dr. Delford Field as scheduled or sooner if needed.     Rubye Oaks, NP  Electronically Signed      Charlcie Cradle Delford Field, MD, [facility]  Electronically Signed   TP/MedQ  DD: 06/25/2006  DT: 06/25/2006  Job #: [id number]

## 2010-09-21 NOTE — Assessment & Plan Note (Signed)
[facility]                             PULMONARY OFFICE NOTE   NAME:HARRISRanelle, Auker                      MRN:          [id number]  DATE:06/16/2006                            DOB:          [birth year]    Ms. Artus comes in today as a work-in.  A 65 year old white female with  history of bronchiectasis, right lower lobe.  She is now coughing up  thick yellow mucus, having hoarseness and soreness in the throat.  These  symptoms have been going on for three days.  She has some aching in the  ears.  She has no hemoptysis, no dyspnea or chest pain.  She maintains  Flonase two sprays each nostril daily, Claritin daily, Protonix as  needed, Mucinex as needed.   PHYSICAL EXAMINATION:  VITAL SIGNS:  Temperature 97, blood pressure  106/72, pulse 101, saturation 100% on room air.  HEENT:  No jugular venous distension, no lymphadenopathy, oropharynx  clear.  Neck supple.  CHEST:  Clear without evidence of wheeze or rhonchi.  CARDIOVASCULAR:  Regular rate and rhythm without S3.  Normal S1 and S2.  ABDOMEN:  Soft and nontender.  EXTREMITIES:  No edema or clubbing, no venous disease.  SKIN:  Clear.  NEUROLOGIC:  Intact.   IMPRESSION:  Chronic obstructive lung disease with asthmatic bronchitic  component.   PLAN:  Patient to receive Zithromax 500 mg a day for three days.  Will  maintain Mucinex DM two b.i.d. for two weeks.  Maintain Flonase 2 sprays  each nostril daily and will see the patient back for return follow-up.     Charlcie Cradle Delford Field, MD, [facility]  Electronically Signed    PEW/MedQ  DD: 06/16/2006  DT: 06/17/2006  Job #: [id number]   cc:   Rosalyn Gess. Norins, MD

## 2010-09-21 NOTE — Assessment & Plan Note (Signed)
[facility]                             PULMONARY OFFICE NOTE   NAME:HARRISYoanna, Jurczyk                      MRN:          [id number]  DATE:06/19/2006                            DOB:          09/30/1945    Ms. Kasson returns today in followup after having been here on June 16, 2006.  She is not much better, she is still coughing, having nasal  congestion, sinus pressure.  She finished her Z-Pak on February 13 and  has not improved.  Chest x-ray today does not reveal any infiltrates.   PHYSICAL EXAMINATION:  The temperature is 97, blood pressure 108/76,  pulse 93, saturation 98% room air.  CHEST:  Showed to be completely clear without evidence of wheeze, rale,  or rhonchi.  CARDIAC:  Showed a regular rate and rhythm without S3, normal S1, S2.  ABDOMEN:  Soft, nontender.  EXTREMITIES:  Showed no edema or clubbing.  SKIN:  Clear.  NEUROLOGIC:  Intact.  HEENT:  Showed jugular venous distention, lymphadenopathy, oropharynx  clear.  NECK:  Supple.   IMPRESSION:  Acute sinusitis, right greater than left maxillary sinuses.   PLAN:  The patient received Omnicef 600 mg  a day for a 10 day course.  The patient to use saline wash and to utilize Flonase daily and we will  return the patient in 2 weeks.     Charlcie Cradle Delford Field, MD, [facility]  Electronically Signed    PEW/MedQ  DD: 06/19/2006  DT: 06/19/2006  Job #: [id number]

## 2010-09-21 NOTE — Assessment & Plan Note (Signed)
[facility]                               PULMONARY OFFICE NOTE   NAME:Monica [name], [name]                      MRN:          [id number]  DATE:01/16/2006                            DOB:          03/20/1946    [name] is a 65 year old white female with right middle lobe cylindrical  bronchiectasis.  She has been doing well recently.  She has had increased  reflux symptomatology, is back on her Protonix 40 mg daily.  Other  maintenance medicines are listed in the chart, and are correct as reviewed  without change.  She is treating her osteoporosis with the Forteo injections  daily along with calcium supplementation.  She has had no excess cough or  dyspnea.   PHYSICAL EXAMINATION:  Temp 97, blood pressure 117/70, pulse 82, saturation  99% on room air.  CHEST:  Shows to be clear without evidence of wheeze, rale, rhonchi or other  adventitious breath sounds.  CARDIAC:  Exam showed a regular rate and rhythm without S3, normal S1, S2.  ABDOMEN:  Soft, nontender.  EXTREMITIES:  Showed no edema or clubbing.  SKIN:  Clear.  NEUROLOGIC:  Exam was intact.  HEENT:  Exam showed no jugular venous distention, no lymphadenopathy.  The  oropharynx is clear.  NECK:  Supple.   IMPRESSION:  Would be that of right middle lobe cylindrical bronchiectasis,  stable at this time.   PLAN:  Maintain Protonix on an as-needed basis.  She was given patient  education materials regarding reflux disease.  Will return in followup in 6  months.                                   Charlcie Cradle Delford Field, MD, FCCP   PEW/MedQ  DD:  01/16/2006  DT:  01/17/2006  Job #:  [id number]   cc:   Rosalyn Gess. Norins, MD

## 2010-10-15 ENCOUNTER — Encounter: Payer: Self-pay | Admitting: Critical Care Medicine

## 2010-10-19 ENCOUNTER — Ambulatory Visit (INDEPENDENT_AMBULATORY_CARE_PROVIDER_SITE_OTHER): Payer: Medicare Other | Admitting: Critical Care Medicine

## 2010-10-19 ENCOUNTER — Encounter: Payer: Self-pay | Admitting: Critical Care Medicine

## 2010-10-19 VITALS — BP 110/74 | HR 85 | Temp 98.1°F | Ht 62.0 in | Wt 108.2 lb

## 2010-10-19 DIAGNOSIS — J479 Bronchiectasis, uncomplicated: Secondary | ICD-10-CM

## 2010-10-19 DIAGNOSIS — M810 Age-related osteoporosis without current pathological fracture: Secondary | ICD-10-CM

## 2010-10-19 NOTE — Patient Instructions (Signed)
No change in medications. Return in        12 months or sooner as needed

## 2010-10-19 NOTE — Progress Notes (Signed)
Subjective:    Patient ID: Monica [name], female    DOB: October 15, 1945, 65 y.o.   MRN: [id number]  HPI This is a 65 y.o.    white female with bronchiectasis in the right middle lobe, chronic sinusitis and chronic rhinitis.  November 02, 2009 10:58 AM  Pt seen several times in June 2011 with cough, fever, lethargy. Sore throat. Initially rx omniceph but not improved so switched on 6/13 to 10days avelox which she has completed. SHe is betternow but CXR abn and so comes in today for review and CT Chest.  Had fever 100.5 but now no fever. Only notes a sl tickle in the throat. Had bad heachache with fever now HA is gone.  Pt denies any significant sore throat, nasal congestion or excess secretions, fever, chills, sweats, unintended weight loss, pleurtic or exertional chest pain, orthopnea PND, or leg swelling  Pt denies any increase in rescue therapy over baseline, denies waking up needing it or having any early am or nocturnal exacerbations of coughing/wheezing/or dyspnea.  Pt also denies any obvious fluctuation in symptoms wiht weather or environmental change or other alleviating or aggravating factors  Pt about to travel to [place] .   10/19/2010 Has been travelling without difficulty.  Recently no cough.  No fever issues.  No real sinus issues. No dyspnea at this time Pt denies any significant sore throat, nasal congestion or excess secretions, fever, chills, sweats, unintended weight loss, pleurtic or exertional chest pain, orthopnea PND, or leg swelling Pt denies any increase in rescue therapy over baseline, denies waking up needing it or having any early am or nocturnal exacerbations of coughing/wheezing/or dyspnea. Pt also denies any obvious fluctuation in symptoms with  weather or environmental change or other alleviating or aggravating factors     Review of Systems Constitutional:   No  weight loss, night sweats,  Fevers, chills, fatigue, lassitude. HEENT:   No headaches,  Difficulty  swallowing,  Tooth/dental problems,  Sore throat,                No sneezing, itching, ear ache, nasal congestion, post nasal drip,   CV:  No chest pain,  Orthopnea, PND, swelling in lower extremities, anasarca, dizziness, palpitations  GI  Notes some  heartburn, indigestion, abdominal pain, nausea, vomiting, diarrhea, change in bowel habits, loss of appetite  Resp: No shortness of breath with exertion or at rest.  No excess mucus, no productive cough,  No non-productive cough,  No coughing up of blood.  No change in color of mucus.  No wheezing.  No chest wall deformity  Skin: no rash or lesions.  GU: no dysuria, change in color of urine, no urgency or frequency.  No flank pain.  MS:  No joint pain or swelling.  No decreased range of motion.  No back pain.  Psych:  No change in mood or affect. No depression or anxiety.  No memory loss.     Objective:   Physical Exam Filed Vitals:   10/19/10 1519  BP: 110/74  Pulse: 85  Temp: 98.1 F (36.7 C)  TempSrc: Oral  Height: 5\' 2"  (1.575 m)  Weight: 108 lb 3.2 oz (49.079 kg)  SpO2: 99%    Gen: Pleasant, well-nourished, in no distress,  normal affect  ENT: No lesions,  mouth clear,  oropharynx clear, no postnasal drip  Neck: No JVD, no TMG, no carotid bruits  Lungs: No use of accessory muscles, no dullness to percussion, clear without rales or rhonchi  Cardiovascular: RRR, heart sounds normal, no murmur or gallops, no peripheral edema  Abdomen: soft and NT, no HSM,  BS normal  Musculoskeletal: No deformities, no cyanosis or clubbing  Neuro: alert, non focal  Skin: Warm, no lesions or rashes        Assessment & Plan:   Bronchiectasis without acute exacerbation Stable bronchiectasis RML Plan  no inhaled meds needed rov 37m onths or prn    Updated Medication List Outpatient Encounter Prescriptions as of 10/19/2010  Medication Sig Dispense Refill  . calcium carbonate (OS-CAL) 600 MG TABS Take 600 mg by mouth daily.         . Cholecalciferol (VITAMIN D) 1000 UNITS capsule Take 1,000 Units by mouth daily.        Marland Kitchen dextromethorphan-guaiFENesin (MUCINEX DM) 30-600 MG per 12 hr tablet Take 1 tablet by mouth every 12 (twelve) hours.        Marland Kitchen estradiol (VAGIFEM) 25 MCG vaginal tablet Take 25 mcg by mouth 2 (two) times a week.        . fluticasone (FLONASE) 50 MCG/ACT nasal spray Place 2 sprays into the nose daily as needed.       . loratadine (CLARITIN) 10 MG tablet Take 10 mg by mouth daily.        . Multiple Vitamins-Minerals (MULTIVITAMIN WITH MINERALS) tablet Take 1 tablet by mouth daily.        . mupirocin (BACTROBAN) 2 % ointment Apply 1 application topically 2 (two) times daily as needed.       . pantoprazole (PROTONIX) 40 MG tablet Take 40 mg by mouth daily as needed.       . Probiotic Product (SUPER PROBIOTIC) CAPS Take 1 capsule by mouth daily.        . vitamin C (ASCORBIC ACID) 500 MG tablet Take 500 mg by mouth daily.        Marland Kitchen DISCONTD: ALPRAZolam (XANAX) 0.25 MG tablet Take 0.25 mg by mouth 3 (three) times daily as needed.

## 2010-10-20 NOTE — Assessment & Plan Note (Signed)
Stable bronchiectasis RML Plan  no inhaled meds needed rov 27m onths or prn

## 2010-10-31 ENCOUNTER — Other Ambulatory Visit: Payer: Self-pay | Admitting: Internal Medicine

## 2010-11-23 ENCOUNTER — Encounter: Payer: Self-pay | Admitting: Internal Medicine

## 2010-11-23 ENCOUNTER — Ambulatory Visit (INDEPENDENT_AMBULATORY_CARE_PROVIDER_SITE_OTHER): Payer: Medicare Other | Admitting: Internal Medicine

## 2010-11-23 VITALS — BP 110/70 | HR 88 | Temp 98.3°F | Resp 16 | Wt 107.0 lb

## 2010-11-23 DIAGNOSIS — K589 Irritable bowel syndrome without diarrhea: Secondary | ICD-10-CM

## 2010-11-23 MED ORDER — DICYCLOMINE HCL 10 MG PO CAPS: 10.0000 mg | ORAL_CAPSULE | Freq: Three times a day (TID) | ORAL | Status: AC

## 2010-11-23 NOTE — Progress Notes (Signed)
  Subjective:    Patient ID: Monica [name], female    DOB: Jan 11, 1946, 65 y.o.   MRN: [id number]  HPI Mrs. Zaucha presents for 5 week h/o abdominal pain, lower quadrant area with a feeling of bloating, like a period. She has had some upper abdominal pain for which she has resumed protonix. She has not had diarrhea or over much gas. She has been followed by Gyn and has had TVUS revealing an ovarian cyst which on repeat TVUS resolved. She has had CA 125 that was not progressive on serial determinations (reviewed UpToDate on the use of CA 125 as diagnostic tool). She has had no fever, chills, hematochezia. She is current with colorectal cancer screening with last colonoscopy October 06, 2008.  PMH, FamHx and SocHx reviewed for any changes and relevance.    Review of Systems Review of Systems  Constitutional:  Negative for fever, chills, activity change and unexpected weight change.  HEENT:  Negative for hearing loss, ear pain, congestion, neck stiffness and postnasal drip. Negative for sore throat or swallowing problems. Negative for dental complaints.   Eyes: Negative for vision loss or change in visual acuity.  Respiratory: Negative for chest tightness and wheezing.   Cardiovascular: Negative for chest pain and palpitation. No decreased exercise tolerance Gastrointestinal: change in bowel habit with bloating and gas. Recurrent mild reflux or indigestion Genitourinary: Negative for urgency, frequency, flank pain and difficulty urinating.  Musculoskeletal: Negative for myalgias, back pain, arthralgias and gait problem.  Neurological: Negative for dizziness, tremors, weakness and headaches.  Hematological: Negative for adenopathy.  Psychiatric/Behavioral: Negative for behavioral problems and dysphoric mood.       Objective:   Physical Exam Vitals noted - normal Gen'l - WNWD white woman in no distress Abdomen - BS+ x 4 quadrants. No guarding or rebound, no masses, no point tenderness        Assessment & Plan:

## 2010-11-25 DIAGNOSIS — K589 Irritable bowel syndrome without diarrhea: Secondary | ICD-10-CM | POA: Insufficient documentation

## 2010-11-25 NOTE — Assessment & Plan Note (Signed)
Patient with  Intermittent bloating, gas, variable bowel habit with no colon pathology by last colonsocopy in '10. Presentation is c/w IBS.  Plan - continue fiber supplements            Bentyl 10 mg TID as needed and may titrate to symptoms.            If no relief will refer to her gastroenterologist - Dr. Russella Dar.

## 2011-02-25 ENCOUNTER — Ambulatory Visit (INDEPENDENT_AMBULATORY_CARE_PROVIDER_SITE_OTHER): Payer: Medicare Other

## 2011-02-25 DIAGNOSIS — Z23 Encounter for immunization: Secondary | ICD-10-CM

## 2011-03-05 ENCOUNTER — Ambulatory Visit (INDEPENDENT_AMBULATORY_CARE_PROVIDER_SITE_OTHER): Payer: Medicare Other | Admitting: Critical Care Medicine

## 2011-03-05 ENCOUNTER — Encounter: Payer: Self-pay | Admitting: Critical Care Medicine

## 2011-03-05 VITALS — BP 122/70 | HR 89 | Temp 97.9°F | Ht 62.0 in | Wt 108.2 lb

## 2011-03-05 DIAGNOSIS — J479 Bronchiectasis, uncomplicated: Secondary | ICD-10-CM

## 2011-03-05 DIAGNOSIS — J301 Allergic rhinitis due to pollen: Secondary | ICD-10-CM

## 2011-03-05 MED ORDER — FLUTICASONE PROPIONATE 50 MCG/ACT NA SUSP: NASAL | Status: DC

## 2011-03-05 NOTE — Patient Instructions (Signed)
Increase flonase to two sprays twice daily for 3 days then once daily Use nedi pot twice daily for 3 days then as needed No other medication change Return 4 months or as needed

## 2011-03-05 NOTE — Progress Notes (Signed)
Subjective:    Patient ID: Monica [name], female    DOB: [birth year], 65 y.o.   MRN: [id number]  HPI  This is a 65 y.o.    white female with bronchiectasis in the right middle lobe, chronic sinusitis and chronic rhinitis.  November 02, 2009 10:58 AM  Pt seen several times in June 2011 with cough, fever, lethargy. Sore throat. Initially rx omniceph but not improved so switched on 6/13 to 10days avelox which she has completed. SHe is betternow but CXR abn and so comes in today for review and CT Chest.  Had fever 100.5 but now no fever. Only notes a sl tickle in the throat. Had bad heachache with fever now HA is gone.  Pt denies any significant sore throat, nasal congestion or excess secretions, fever, chills, sweats, unintended weight loss, pleurtic or exertional chest pain, orthopnea PND, or leg swelling  Pt denies any increase in rescue therapy over baseline, denies waking up needing it or having any early am or nocturnal exacerbations of coughing/wheezing/or dyspnea.  Pt also denies any obvious fluctuation in symptoms wiht weather or environmental change or other alleviating or aggravating factors  Pt about to travel to [place] .   6/25 Has been travelling without difficulty.  Recently no cough.  No fever issues.  No real sinus issues. No dyspnea at this time Pt denies any significant sore throat, nasal congestion or excess secretions, fever, chills, sweats, unintended weight loss, pleurtic or exertional chest pain, orthopnea PND, or leg swelling Pt denies any increase in rescue therapy over baseline, denies waking up needing it or having any early am or nocturnal exacerbations of coughing/wheezing/or dyspnea. Pt also denies any obvious fluctuation in symptoms with  weather or environmental change or other alleviating or aggravating factors  10/30 Since end of 9/12, more difficulty. Noting more dyspnea and cough .  Noting chills in the PM.  Noting bronchial issues. Noting T100.6 this past  weekend. Had allergies all month. Teoh rx pred dosepak x 6days, finished 02/05/11.  Notes some sinus pressure, headaches. Notes nausea, no emesis.  Notes more dyspnea and palpitations. Chest pain is upper ant chest, feels when first dx, fever, did feel right.      Review of Systems  Constitutional:   No  weight loss, night sweats,  Fevers, chills, fatigue, lassitude. HEENT:   No headaches,  Difficulty swallowing,  Tooth/dental problems,  Sore throat,                No sneezing, itching, ear ache, nasal congestion, post nasal drip,   CV:  Notes  chest pain,  Orthopnea, PND, swelling in lower extremities, anasarca, dizziness, palpitations  GI  No   heartburn, indigestion, abdominal pain, nausea, vomiting, diarrhea, change in bowel habits, loss of appetite  Resp: Notes  shortness of breath with exertion not at rest.  Notes  excess mucus, notes  productive cough,  No non-productive cough,  No coughing up of blood.  Notes  change in color of mucus.  No wheezing.  No chest wall deformity  Skin: no rash or lesions.  GU: no dysuria, change in color of urine, no urgency or frequency.  No flank pain.  MS:  No joint pain or swelling.  No decreased range of motion.  No back pain.  Psych:  No change in mood or affect. No depression or anxiety.  No memory loss.     Objective:   Physical Exam  Filed Vitals:   03/05/11 0954  BP: 122/70  Pulse: 89  Temp: 97.9 F (36.6 C)  TempSrc: Oral  Height: 5\' 2"  (1.575 m)  Weight: 108 lb 3.2 oz (49.079 kg)  SpO2: 97%    Gen: Pleasant, well-nourished, in no distress,  normal affect  ENT: No lesions,  mouth clear,  oropharynx clear, no postnasal drip  Neck: No JVD, no TMG, no carotid bruits  Lungs: No use of accessory muscles, no dullness to percussion, clear without rales or rhonchi  Cardiovascular: RRR, heart sounds normal, no murmur or gallops, no peripheral edema  Abdomen: soft and NT, no HSM,  BS normal  Musculoskeletal: No deformities, no  cyanosis or clubbing  Neuro: alert, non focal  Skin: Warm, no lesions or rashes        Assessment & Plan:   Bronchiectasis without acute exacerbation Stable chronic bronchiectasis No evidence of active inflammation Evidence of mild allergic rhinitis only Plan Continue nasal wash No indication for antibiotics     Updated Medication List Outpatient Encounter Prescriptions as of 03/05/2011  Medication Sig Dispense Refill  . Calcium Carbonate (CALTRATE 600 PO) Take 1 each by mouth daily.        . Cholecalciferol (VITAMIN D) 1000 UNITS capsule Take 1,000 Units by mouth daily.        Marland Kitchen dextromethorphan-guaiFENesin (MUCINEX DM) 30-600 MG per 12 hr tablet Take 1 tablet by mouth every 12 (twelve) hours.       . Estradiol 10 MCG TABS Place 1 tablet vaginally. Two times weekly.       . fluticasone (FLONASE) 50 MCG/ACT nasal spray Two sprays each nostril twice daily for 3 days then once daily  1 g  0  . loratadine (CLARITIN) 10 MG tablet Take 10 mg by mouth daily.        . methylcellulose (CITRUCEL) oral powder Take by mouth daily as needed. One tablespoon.       . Multiple Vitamins-Minerals (MULTIVITAMIN WITH MINERALS) tablet Take 1 tablet by mouth daily.        . mupirocin (BACTROBAN) 2 % ointment Apply 1 application topically 2 (two) times daily as needed.       . Probiotic Product (SUPER PROBIOTIC) CAPS Take 1 capsule by mouth daily.        Marland Kitchen PROTONIX 40 MG tablet TAKE 1 TABLET ONCE DAILY.  30 each  6  . vitamin C (ASCORBIC ACID) 500 MG tablet Take 500 mg by mouth daily.        Marland Kitchen DISCONTD: calcium carbonate (OS-CAL) 600 MG TABS Take 600 mg by mouth daily.        Marland Kitchen DISCONTD: fluticasone (FLONASE) 50 MCG/ACT nasal spray Place 2 sprays into the nose daily as needed.

## 2011-03-06 NOTE — Assessment & Plan Note (Signed)
Stable chronic bronchiectasis No evidence of active inflammation Evidence of mild allergic rhinitis only Plan Continue nasal wash No indication for antibiotics

## 2011-04-04 ENCOUNTER — Encounter: Payer: Self-pay | Admitting: Internal Medicine

## 2011-06-17 DIAGNOSIS — H5310 Unspecified subjective visual disturbances: Secondary | ICD-10-CM | POA: Diagnosis not present

## 2011-06-18 DIAGNOSIS — M65839 Other synovitis and tenosynovitis, unspecified forearm: Secondary | ICD-10-CM | POA: Diagnosis not present

## 2011-06-18 DIAGNOSIS — H43819 Vitreous degeneration, unspecified eye: Secondary | ICD-10-CM | POA: Diagnosis not present

## 2011-06-19 DIAGNOSIS — M542 Cervicalgia: Secondary | ICD-10-CM | POA: Diagnosis not present

## 2011-06-19 DIAGNOSIS — M5030 Other cervical disc degeneration, unspecified cervical region: Secondary | ICD-10-CM | POA: Diagnosis not present

## 2011-06-24 DIAGNOSIS — M5030 Other cervical disc degeneration, unspecified cervical region: Secondary | ICD-10-CM | POA: Diagnosis not present

## 2011-06-24 DIAGNOSIS — M542 Cervicalgia: Secondary | ICD-10-CM | POA: Diagnosis not present

## 2011-06-27 DIAGNOSIS — M542 Cervicalgia: Secondary | ICD-10-CM | POA: Diagnosis not present

## 2011-06-27 DIAGNOSIS — M5030 Other cervical disc degeneration, unspecified cervical region: Secondary | ICD-10-CM | POA: Diagnosis not present

## 2011-07-01 DIAGNOSIS — M542 Cervicalgia: Secondary | ICD-10-CM | POA: Diagnosis not present

## 2011-07-01 DIAGNOSIS — M5030 Other cervical disc degeneration, unspecified cervical region: Secondary | ICD-10-CM | POA: Diagnosis not present

## 2011-07-03 DIAGNOSIS — M542 Cervicalgia: Secondary | ICD-10-CM | POA: Diagnosis not present

## 2011-07-03 DIAGNOSIS — M5030 Other cervical disc degeneration, unspecified cervical region: Secondary | ICD-10-CM | POA: Diagnosis not present

## 2011-07-23 DIAGNOSIS — N8300 Follicular cyst of ovary, unspecified side: Secondary | ICD-10-CM | POA: Diagnosis not present

## 2011-09-26 ENCOUNTER — Encounter: Payer: Self-pay | Admitting: Critical Care Medicine

## 2011-09-26 ENCOUNTER — Ambulatory Visit (INDEPENDENT_AMBULATORY_CARE_PROVIDER_SITE_OTHER): Payer: Medicare Other | Admitting: Critical Care Medicine

## 2011-09-26 VITALS — BP 114/64 | HR 90 | Temp 98.5°F | Ht 62.0 in | Wt 106.0 lb

## 2011-09-26 DIAGNOSIS — J329 Chronic sinusitis, unspecified: Secondary | ICD-10-CM

## 2011-09-26 MED ORDER — MOXIFLOXACIN HCL 400 MG PO TABS: 400.0000 mg | ORAL_TABLET | Freq: Every day | ORAL | Status: DC

## 2011-09-26 NOTE — Progress Notes (Signed)
Subjective:    Patient ID: Monica [name], female    DOB: [birth year], 66 y.o.   MRN: [id number]  HPI  This is a 66 y.o.    white female with bronchiectasis in the right middle lobe, chronic sinusitis and chronic rhinitis.   10/30 Since end of 9/12, more difficulty. Noting more dyspnea and cough .  Noting chills in the PM.  Noting bronchial issues. Noting T100.6 this past weekend. Had allergies all month. Teoh rx pred dosepak x 6days, finished    02/05/11.  Notes some sinus pressure, headaches. Notes nausea, no emesis.  Notes more dyspnea and palpitations. Chest pain is upper ant chest, feels when first dx, fever, did feel right.    09/26/2011 Pt noted sore throat 4 days ago and severe nasal drainage. Pt notes H/A.  Facial pain and ear pain. No cough, sore in the upper chest area.  No real change in dyspnea.  No edema in feet.  Nasal drainage is clear to light yellow. No green    Review of Systems  Constitutional:   No  weight loss, night sweats,  Fevers, chills, fatigue, lassitude. HEENT:   No headaches,  Difficulty swallowing,  Tooth/dental problems,  Sore throat,                No sneezing, itching, ear ache, nasal congestion, post nasal drip,   CV:  Notes  chest pain,  Orthopnea, PND, swelling in lower extremities, anasarca, dizziness, palpitations  GI  No   heartburn, indigestion, abdominal pain, nausea, vomiting, diarrhea, change in bowel habits, loss of appetite  Resp: Notes  shortness of breath with exertion not at rest.  Notes  excess mucus, notes  productive cough,  No non-productive cough,  No coughing up of blood.  Notes  change in color of mucus.  No wheezing.  No chest wall deformity  Skin: no rash or lesions.  GU: no dysuria, change in color of urine, no urgency or frequency.  No flank pain.  MS:  No joint pain or swelling.  No decreased range of motion.  No back pain.  Psych:  No change in mood or affect. No depression or anxiety.  No memory loss.       Objective:   Physical Exam  Filed Vitals:   09/26/11 1346  BP: 114/64  Pulse: 90  Temp: 98.5 F (36.9 C)  TempSrc: Oral  Height: 5\' 2"  (1.575 m)  Weight: 106 lb (48.081 kg)  SpO2: 97%    Gen: Pleasant, well-nourished, in no distress,  normal affect  ENT: No lesions,  mouth clear,  oropharynx clear, ++ postnasal drip, purulent nares  Neck: No JVD, no TMG, no carotid bruits  Lungs: No use of accessory muscles, no dullness to percussion, clear without rales or rhonchi  Cardiovascular: RRR, heart sounds normal, no murmur or gallops, no peripheral edema  Abdomen: soft and NT, no HSM,  BS normal  Musculoskeletal: No deformities, no cyanosis or clubbing  Neuro: alert, non focal  Skin: Warm, no lesions or rashes        Assessment & Plan:   SINUSITIS, CHRONIC Acute on chronic sinusitis with flare left greater than right maxillary sinus Plan Avelox for 5 days Continue nasal hygiene Continue Flonase     Updated Medication List Outpatient Encounter Prescriptions as of 09/26/2011  Medication Sig Dispense Refill  . Calcium Carbonate (CALTRATE 600 PO) Take 1 each by mouth daily.        . Cholecalciferol (VITAMIN D) 1000 UNITS  capsule Take 1,000 Units by mouth daily.        . Estradiol 10 MCG TABS Place 1 tablet vaginally. Two times weekly.       . fluticasone (FLONASE) 50 MCG/ACT nasal spray Place 2 sprays into the nose daily.      . Guaifenesin (MUCINEX MAXIMUM STRENGTH) 1200 MG TB12 Take 1 tablet by mouth every 12 (twelve) hours as needed.      . methylcellulose (CITRUCEL) oral powder Take by mouth daily as needed. One tablespoon.       . Multiple Vitamins-Minerals (MULTIVITAMIN WITH MINERALS) tablet Take 1 tablet by mouth daily.        . mupirocin (BACTROBAN) 2 % ointment Apply 1 application topically 2 (two) times daily as needed.       Marland Kitchen omeprazole (PRILOSEC OTC) 20 MG tablet Take 20 mg by mouth daily as needed.      . Probiotic Product (SUPER PROBIOTIC) CAPS Take  1 capsule by mouth daily.        . vitamin C (ASCORBIC ACID) 500 MG tablet Take 500 mg by mouth daily.        Marland Kitchen DISCONTD: fluticasone (FLONASE) 50 MCG/ACT nasal spray Two sprays each nostril twice daily for 3 days then once daily  1 g  0  . loratadine (CLARITIN) 10 MG tablet Take 10 mg by mouth daily.       Marland Kitchen moxifloxacin (AVELOX) 400 MG tablet Take 1 tablet (400 mg total) by mouth daily.  5 tablet  0  . DISCONTD: dextromethorphan-guaiFENesin (MUCINEX DM) 30-600 MG per 12 hr tablet Take 1 tablet by mouth every 12 (twelve) hours.       Marland Kitchen DISCONTD: PROTONIX 40 MG tablet TAKE 1 TABLET ONCE DAILY.  30 each  6

## 2011-09-26 NOTE — Assessment & Plan Note (Signed)
Acute on chronic sinusitis with flare left greater than right maxillary sinus Plan Avelox for 5 days Continue nasal hygiene Continue Flonase

## 2011-09-26 NOTE — Patient Instructions (Signed)
Avelox one daily (sent to pharmacy downstairs)  5 day course Stay on flonase Continue nasal rinse Return if unimproved.

## 2011-10-01 ENCOUNTER — Telehealth: Payer: Self-pay | Admitting: Critical Care Medicine

## 2011-10-01 NOTE — Telephone Encounter (Signed)
ATC x 2 line busy  

## 2011-10-01 NOTE — Telephone Encounter (Signed)
Spoke with pt. She states that she is needing more abx. She has finished the 5 days worth that we prescribed on 09/26/11 and feel only some better. No more fever and facial pain is less, but still has yellow nasal d/c and mom prod cough. I offered her ov for tomorrow, but she refused, stating only wants to come in if PW thinks that this is necc. Please advise thanks!

## 2011-10-01 NOTE — Telephone Encounter (Signed)
Extend ABX : Avelox 400mg  /d for 5 more days Also: Prednisone 10mg  Take 4 for two days three for two days two for two days one for two days  #20

## 2011-10-02 MED ORDER — PREDNISONE 10 MG PO TABS: ORAL_TABLET | ORAL | Status: AC

## 2011-10-02 MED ORDER — MOXIFLOXACIN HCL 400 MG PO TABS: 400.0000 mg | ORAL_TABLET | Freq: Every day | ORAL | Status: AC

## 2011-10-02 NOTE — Telephone Encounter (Signed)
I spoke with pt and is aware of PW recs. rx has been sent to the pharmacy

## 2011-10-31 DIAGNOSIS — H43819 Vitreous degeneration, unspecified eye: Secondary | ICD-10-CM | POA: Diagnosis not present

## 2011-12-11 DIAGNOSIS — L723 Sebaceous cyst: Secondary | ICD-10-CM | POA: Diagnosis not present

## 2011-12-11 DIAGNOSIS — T148 Other injury of unspecified body region: Secondary | ICD-10-CM | POA: Diagnosis not present

## 2011-12-11 DIAGNOSIS — L282 Other prurigo: Secondary | ICD-10-CM | POA: Diagnosis not present

## 2011-12-11 DIAGNOSIS — W57XXXA Bitten or stung by nonvenomous insect and other nonvenomous arthropods, initial encounter: Secondary | ICD-10-CM | POA: Diagnosis not present

## 2012-02-04 ENCOUNTER — Telehealth: Payer: Self-pay | Admitting: Critical Care Medicine

## 2012-02-04 NOTE — Telephone Encounter (Signed)
Patient scheduled to come in tomorrow 10/2 @ 9:45a.m.  Nothing else needed.  [facility]

## 2012-02-04 NOTE — Telephone Encounter (Signed)
LMTCB x 1. - When will pt be coming for flu shot so we can put her on schedule?

## 2012-02-05 ENCOUNTER — Ambulatory Visit (INDEPENDENT_AMBULATORY_CARE_PROVIDER_SITE_OTHER): Payer: Medicare Other

## 2012-02-05 DIAGNOSIS — Z23 Encounter for immunization: Secondary | ICD-10-CM | POA: Diagnosis not present

## 2012-03-26 DIAGNOSIS — Z1231 Encounter for screening mammogram for malignant neoplasm of breast: Secondary | ICD-10-CM | POA: Diagnosis not present

## 2012-04-03 ENCOUNTER — Encounter: Payer: Self-pay | Admitting: Internal Medicine

## 2012-05-28 DIAGNOSIS — H6980 Other specified disorders of Eustachian tube, unspecified ear: Secondary | ICD-10-CM | POA: Diagnosis not present

## 2012-05-28 DIAGNOSIS — H905 Unspecified sensorineural hearing loss: Secondary | ICD-10-CM | POA: Diagnosis not present

## 2012-06-08 DIAGNOSIS — H01009 Unspecified blepharitis unspecified eye, unspecified eyelid: Secondary | ICD-10-CM | POA: Diagnosis not present

## 2012-06-08 DIAGNOSIS — H04129 Dry eye syndrome of unspecified lacrimal gland: Secondary | ICD-10-CM | POA: Diagnosis not present

## 2012-06-26 DIAGNOSIS — J343 Hypertrophy of nasal turbinates: Secondary | ICD-10-CM | POA: Diagnosis not present

## 2012-06-26 DIAGNOSIS — J310 Chronic rhinitis: Secondary | ICD-10-CM | POA: Diagnosis not present

## 2012-07-17 DIAGNOSIS — Z01419 Encounter for gynecological examination (general) (routine) without abnormal findings: Secondary | ICD-10-CM | POA: Diagnosis not present

## 2012-07-17 DIAGNOSIS — Z23 Encounter for immunization: Secondary | ICD-10-CM | POA: Diagnosis not present

## 2012-07-17 DIAGNOSIS — Z124 Encounter for screening for malignant neoplasm of cervix: Secondary | ICD-10-CM | POA: Diagnosis not present

## 2012-07-21 DIAGNOSIS — M949 Disorder of cartilage, unspecified: Secondary | ICD-10-CM | POA: Diagnosis not present

## 2012-07-21 DIAGNOSIS — M899 Disorder of bone, unspecified: Secondary | ICD-10-CM | POA: Diagnosis not present

## 2012-07-24 DIAGNOSIS — J310 Chronic rhinitis: Secondary | ICD-10-CM | POA: Diagnosis not present

## 2012-08-03 ENCOUNTER — Ambulatory Visit (INDEPENDENT_AMBULATORY_CARE_PROVIDER_SITE_OTHER): Payer: Medicare Other | Admitting: Adult Health

## 2012-08-03 ENCOUNTER — Encounter: Payer: Self-pay | Admitting: Adult Health

## 2012-08-03 VITALS — BP 114/72 | HR 69 | Temp 98.1°F | Ht 62.0 in | Wt 104.6 lb

## 2012-08-03 DIAGNOSIS — J301 Allergic rhinitis due to pollen: Secondary | ICD-10-CM

## 2012-08-03 MED ORDER — AZITHROMYCIN 250 MG PO TABS: ORAL_TABLET | ORAL | Status: AC

## 2012-08-03 NOTE — Assessment & Plan Note (Signed)
Flare w/ URI/AR   Plan  Increase flonase to two sprays twice daily for 3 days then once daily Use nedi pot twice daily for 3 days then as needed Add sudafed for 1-2 days As needed   Tylenol and fluids Zpack to have on hold if symptoms worsen with discolored mucus.  Follow up Dr. Delford Field  As planned and As needed

## 2012-08-03 NOTE — Patient Instructions (Addendum)
Increase flonase to two sprays twice daily for 3 days then once daily Use nedi pot twice daily for 3 days then as needed Add sudafed for 1-2 days As needed   Tylenol and fluids Zpack to have on hold if symptoms worsen with discolored mucus.  Follow up Dr. Delford Field  As planned and As needed

## 2012-08-03 NOTE — Progress Notes (Signed)
Subjective:    Patient ID: Monica [name], female    DOB: 10-09-1945, 67 y.o.   MRN: [id number]  HPI  This is a 67 y.o.    white female with bronchiectasis in the right middle lobe, chronic sinusitis and chronic rhinitis.   10/30 Since end of 9/12, more difficulty. Noting more dyspnea and cough .  Noting chills in the PM.  Noting bronchial issues. Noting T100.6 this past weekend. Had allergies all month. Teoh rx pred dosepak x 6days, finished    02/05/11.  Notes some sinus pressure, headaches. Notes nausea, no emesis.  Notes more dyspnea and palpitations. Chest pain is upper ant chest, feels when first dx, fever, did feel right.    09/26/2011 Pt noted sore throat 4 days ago and severe nasal drainage. Pt notes H/A.  Facial pain and ear pain. No cough, sore in the upper chest area.  No real change in dyspnea.  No edema in feet.  Nasal drainage is clear to light yellow. No green  09/26/11   increased SOB, wheezing, nasal congestion  with thick green mucus, some tightness, fatigue, loss of appetite x3 days - denies f/c/s.  reports had to use SABA 2-3 times over the weekend  08/03/2012 Acute OV  Complains of post nasal drip, with sinus headache and fatigue.  No cough or congestion . No fever. Clear drainage.  Leaving in 3 weeks for [place] trip x 6 weeks.  No increased SABA use. No recent travel or abx use.  No teeth pain.    Review of Systems  Constitutional:   No  weight loss, night sweats,  Fevers, chills, fatigue, lassitude. HEENT:   No headaches,  Difficulty swallowing,  Tooth/dental problems,  Sore throat,                No sneezing, itching, ear ache,  +nasal congestion, post nasal drip,   CV:  Notes  chest pain,  Orthopnea, PND, swelling in lower extremities, anasarca, dizziness, palpitations  GI  No   heartburn, indigestion, abdominal pain, nausea, vomiting, diarrhea, change in bowel habits, loss of appetite  Resp:   No wheezing.  No chest wall deformity  Skin: no rash  or lesions.  GU: no dysuria, change in color of urine, no urgency or frequency.  No flank pain.  MS:  No joint pain or swelling.  No decreased range of motion.  No back pain.  Psych:  No change in mood or affect. No depression or anxiety.  No memory loss.     Objective:   Physical Exam  Filed Vitals:   08/03/12 1244  BP: 114/72  Pulse: 69  Temp: 98.1 F (36.7 C)  TempSrc: Oral  Height: 5\' 2"  (1.575 m)  Weight: 104 lb 9.6 oz (47.446 kg)  SpO2: 100%    Gen: Pleasant, well-nourished, in no distress,  normal affect  ENT: No lesions,  mouth clear,  oropharynx clear, ++clear  postnasal drip., nontender sinus   Neck: No JVD, no TMG, no carotid bruits  Lungs: No use of accessory muscles, no dullness to percussion, clear without rales or rhonchi  Cardiovascular: RRR, heart sounds normal, no murmur or gallops, no peripheral edema  Abdomen: soft and NT, no HSM,  BS normal  Musculoskeletal: No deformities, no cyanosis or clubbing  Neuro: alert, non focal  Skin: Warm, no lesions or rashes        Assessment & Plan:   No problem-specific assessment & plan notes found for this encounter.   Updated  Medication List Outpatient Encounter Prescriptions as of 08/03/2012  Medication Sig Dispense Refill  . Calcium Carbonate (CALTRATE 600 PO) Take 1 each by mouth daily.        . Cholecalciferol (VITAMIN D) 1000 UNITS capsule Take 1,000 Units by mouth daily.        . Estradiol 10 MCG TABS Place 1 tablet vaginally. Two times weekly.       . fluticasone (FLONASE) 50 MCG/ACT nasal spray Place 2 sprays into the nose daily.      . Guaifenesin (MUCINEX MAXIMUM STRENGTH) 1200 MG TB12 Take 1 tablet by mouth every 12 (twelve) hours as needed.      . loratadine (CLARITIN) 10 MG tablet Take 10 mg by mouth daily.       . methylcellulose (CITRUCEL) oral powder Take by mouth daily as needed. One tablespoon.       . Multiple Vitamins-Minerals (MULTIVITAMIN WITH MINERALS) tablet Take 1 tablet by  mouth daily.        . mupirocin (BACTROBAN) 2 % ointment Apply 1 application topically 2 (two) times daily as needed.       Marland Kitchen omeprazole (PRILOSEC OTC) 20 MG tablet Take 20 mg by mouth daily as needed.      . predniSONE (DELTASONE) 10 MG tablet Take 4 tablets x 2 days, 3 tablets x 2 days, 2 tablets x 2 days 1 tablet x 2 days then stop  20 tablet  0  . Probiotic Product (SUPER PROBIOTIC) CAPS Take 1 capsule by mouth daily.        . vitamin C (ASCORBIC ACID) 500 MG tablet Take 500 mg by mouth daily.         No facility-administered encounter medications on file as of 08/03/2012.

## 2012-08-06 ENCOUNTER — Encounter: Payer: Self-pay | Admitting: Internal Medicine

## 2012-08-06 ENCOUNTER — Ambulatory Visit (INDEPENDENT_AMBULATORY_CARE_PROVIDER_SITE_OTHER): Payer: Medicare Other | Admitting: Internal Medicine

## 2012-08-06 VITALS — BP 130/80 | HR 93 | Temp 98.2°F | Resp 8 | Wt 102.0 lb

## 2012-08-06 DIAGNOSIS — G44209 Tension-type headache, unspecified, not intractable: Secondary | ICD-10-CM | POA: Diagnosis not present

## 2012-08-06 MED ORDER — PROMETHAZINE HCL 12.5 MG PO TABS: 12.5000 mg | ORAL_TABLET | Freq: Three times a day (TID) | ORAL | Status: DC | PRN

## 2012-08-06 NOTE — Patient Instructions (Addendum)
Headache - no evidence of sinus infection or flare of allergy. In addition, has not responded to the usual treatment for sinus related problems. The description of front-occiptal discomfort like a weight or band of tightness is strongly suggestive of a Tension headache.  Plan No antibiotics  Ibuprofen 400-600 mg three times a day  Phenergan 12.5 mg every 6 hours as needed.  Zantac twice a day for prevention of gastric irritation  Continue your routine allergy treatments.   Tension Headache A tension headache is a feeling of pain, pressure, or aching often felt over the front and sides of the head. The pain can be dull or can feel tight (constricting). It is the most common type of headache. Tension headaches are not normally associated with nausea or vomiting and do not get worse with physical activity. Tension headaches can last 30 minutes to several days.  CAUSES  The exact cause is not known, but it may be caused by chemicals and hormones in the brain that lead to pain. Tension headaches often begin after stress, anxiety, or depression. Other triggers may include:  Alcohol.  Caffeine (too much or withdrawal).  Respiratory infections (colds, flu, sinus infections).  Dental problems or teeth clenching.  Fatigue.  Holding your head and neck in one position too long while using a computer. SYMPTOMS   Pressure around the head.   Dull, aching head pain.   Pain felt over the front and sides of the head.   Tenderness in the muscles of the head, neck, and shoulders. DIAGNOSIS  A tension headache is often diagnosed based on:   Symptoms.   Physical examination.   A CT scan or MRI of your head. These tests may be ordered if symptoms are severe or unusual. TREATMENT  Medicines may be given to help relieve symptoms.  HOME CARE INSTRUCTIONS   Only take over-the-counter or prescription medicines for pain or discomfort as directed by your caregiver.   Lie down in a dark, quiet  room when you have a headache.   Keep a journal to find out what may be triggering your headaches. For example, write down:  What you eat and drink.  How much sleep you get.  Any change to your diet or medicines.  Try massage or other relaxation techniques.   Ice packs or heat applied to the head and neck can be used. Use these 3 to 4 times per day for 15 to 20 minutes each time, or as needed.   Limit stress.   Sit up straight, and do not tense your muscles.   Quit smoking if you smoke.  Limit alcohol use.  Decrease the amount of caffeine you drink, or stop drinking caffeine.  Eat and exercise regularly.  Get 7 to 9 hours of sleep, or as recommended by your caregiver.  Avoid excessive use of pain medicine as recurrent headaches can occur.  SEEK MEDICAL CARE IF:   You have problems with the medicines you were prescribed.  Your medicines do not work.  You have a change from the usual headache.  You have nausea or vomiting. SEEK IMMEDIATE MEDICAL CARE IF:   Your headache becomes severe.  You have a fever.  You have a stiff neck.  You have loss of vision.  You have muscular weakness or loss of muscle control.  You lose your balance or have trouble walking.  You feel faint or pass out.  You have severe symptoms that are different from your first symptoms. MAKE SURE YOU:  Understand these instructions.  Will watch your condition.  Will get help right away if you are not doing well or get worse. Document Released: 04/22/2005 Document Revised: 07/15/2011 Document Reviewed: 04/12/2011 [facility] Patient Information 2013 [place], [place].

## 2012-08-06 NOTE — Progress Notes (Signed)
Subjective:    Patient ID: Monica [name], female    DOB: 25-Jul-1945, 67 y.o.   MRN: [id number]  HPI Treated for sinus infection in February '14 Dr. Avel Sensor PA -  Steroids. Cleared symptoms.  March 21st f/u with [name] - doing OK. Sunday March 30 th recurrent symptoms with pressure, post-nasal drip, nausea. No cough, no fever. Saw [name] Monday, 3/31 - conservative treatment - sudafed, flonase twice a day. No improvement. Z-pak was called in for use. Continued nausea and continue pain/discomfort. She denies fever, purulent drainage, otalgia. She describes the pain as being frontal to occiput. When describing the discomfort it is a tight band like pain or crushing as opposed to exploding headache. There is some periorbital discomfort but less so and different from previous sinus infections.   PMH, FamHx and SocHx reviewed for any changes and relevance.  Current Outpatient Prescriptions on File Prior to Visit  Medication Sig Dispense Refill  . azithromycin (ZITHROMAX Z-PAK) 250 MG tablet Take 2 tablets (500 mg) on  Day 1,  followed by 1 tablet (250 mg) once daily on Days 2 through 5.  6 each  0  . Calcium Carbonate (CALTRATE 600 PO) Take 1 each by mouth daily.        . Cholecalciferol (VITAMIN D) 1000 UNITS capsule Take 1,000 Units by mouth daily.        . Estradiol 10 MCG TABS Place 1 tablet vaginally. Two times weekly.       . fluticasone (FLONASE) 50 MCG/ACT nasal spray Place 2 sprays into the nose daily.      . Guaifenesin (MUCINEX MAXIMUM STRENGTH) 1200 MG TB12 Take 1 tablet by mouth every 12 (twelve) hours as needed.      . loratadine (CLARITIN) 10 MG tablet Take 10 mg by mouth daily.       . methylcellulose (CITRUCEL) oral powder Take by mouth daily as needed. One tablespoon.       . Multiple Vitamins-Minerals (MULTIVITAMIN WITH MINERALS) tablet Take 1 tablet by mouth daily.        . mupirocin (BACTROBAN) 2 % ointment Apply 1 application topically 2 (two) times daily as needed.       Marland Kitchen  omeprazole (PRILOSEC OTC) 20 MG tablet Take 20 mg by mouth daily as needed.      . predniSONE (DELTASONE) 10 MG tablet Take 4 tablets x 2 days, 3 tablets x 2 days, 2 tablets x 2 days 1 tablet x 2 days then stop  20 tablet  0  . Probiotic Product (SUPER PROBIOTIC) CAPS Take 1 capsule by mouth daily.        . vitamin C (ASCORBIC ACID) 500 MG tablet Take 500 mg by mouth daily.         No current facility-administered medications on file prior to visit.      Review of Systems System review is negative for any constitutional, cardiac, pulmonary, GI or neuro symptoms or complaints other than as described in the HPI.     Objective:   Physical Exam Filed Vitals:   08/06/12 1648  BP: 130/80  Pulse: 93  Temp: 98.2 F (36.8 C)  Resp: 8   Wt Readings from Last 3 Encounters:  08/06/12 102 lb (46.267 kg)  08/03/12 104 lb 9.6 oz (47.446 kg)  09/26/11 106 lb (48.081 kg)   Gen'l- WNWD slight of frame white woman in no acute distress HEENT - [place]/AT, C&S clear, no acute tenderness to percussion over the frontal or maxillary  sinus Cor- RRR Pulm - lungs are Clear  Neuro - A&O x 3       Assessment & Plan:  Headache - no evidence of sinus infection or flare of allergy. In addition, has not responded to the usual treatment for sinus related problems. The description of front-occiptal discomfort like a weight or band of tightness is strongly suggestive of a Tension headache.  Plan No antibiotics  Ibuprofen 400-600 mg three times a day  Phenergan 12.5 mg every 6 hours as needed.  Zantac twice a day for prevention of gastric irritation  Continue your routine allergy treatments.

## 2012-08-09 ENCOUNTER — Encounter: Payer: Self-pay | Admitting: Internal Medicine

## 2012-08-10 ENCOUNTER — Other Ambulatory Visit: Payer: Self-pay | Admitting: Internal Medicine

## 2012-08-10 MED ORDER — TEMAZEPAM 15 MG PO CAPS: 15.0000 mg | ORAL_CAPSULE | Freq: Every evening | ORAL | Status: DC | PRN

## 2012-08-10 MED ORDER — ONDANSETRON HCL 4 MG PO TABS: 4.0000 mg | ORAL_TABLET | Freq: Three times a day (TID) | ORAL | Status: DC | PRN

## 2012-08-13 ENCOUNTER — Encounter: Payer: Self-pay | Admitting: Internal Medicine

## 2012-08-13 DIAGNOSIS — R51 Headache: Secondary | ICD-10-CM

## 2012-08-13 MED ORDER — NABUMETONE 750 MG PO TABS: 750.0000 mg | ORAL_TABLET | Freq: Two times a day (BID) | ORAL | Status: DC

## 2012-08-14 ENCOUNTER — Ambulatory Visit (INDEPENDENT_AMBULATORY_CARE_PROVIDER_SITE_OTHER)
Admission: RE | Admit: 2012-08-14 | Discharge: 2012-08-14 | Disposition: A | Discharge status: Home / Self Care | Payer: Medicare Other | Source: Ambulatory Visit | Attending: Internal Medicine | Admitting: Internal Medicine

## 2012-08-14 ENCOUNTER — Encounter: Payer: Self-pay | Admitting: Internal Medicine

## 2012-08-14 DIAGNOSIS — R51 Headache: Secondary | ICD-10-CM

## 2012-08-15 ENCOUNTER — Other Ambulatory Visit: Payer: Self-pay | Admitting: Internal Medicine

## 2012-08-15 DIAGNOSIS — R51 Headache: Secondary | ICD-10-CM

## 2012-08-17 ENCOUNTER — Other Ambulatory Visit: Payer: Self-pay | Admitting: Internal Medicine

## 2012-11-10 DIAGNOSIS — E559 Vitamin D deficiency, unspecified: Secondary | ICD-10-CM | POA: Diagnosis not present

## 2012-11-12 DIAGNOSIS — N8300 Follicular cyst of ovary, unspecified side: Secondary | ICD-10-CM | POA: Diagnosis not present

## 2012-11-16 ENCOUNTER — Telehealth: Payer: Self-pay | Admitting: Critical Care Medicine

## 2012-11-16 NOTE — Telephone Encounter (Signed)
Pt aware appt scheduled for her. Nothing further was needed

## 2012-11-16 NOTE — Telephone Encounter (Signed)
I spoke with pt. She c/o HA, facial pressure. Fever last night of 101.2 and this AM of 100.0, sternum feels achy, blowing out clear-light yellow phlem from nose, PND, nasal congestion x yesterday. She has been doing saline rinses and she started avelox last night. Per pt she has 10 days worth from prescriptions he had on hand. Pt is wanting to know if it is okay for her to be on avelox or does she need something else. Nothing available with any provider today. Please advise Dr. Delford Field thanks  Allergies  Allergen Reactions  . Amoxicillin-Pot Clavulanate     REACTION: GI Upset  . Benzonatate     REACTION: felt bad, out of sorts, disoriented.  . Ciprofloxacin     REACTION: rash  . Sulfonamide Derivatives

## 2012-11-16 NOTE — Telephone Encounter (Signed)
Use avelox. Needs f/u ov for recheck

## 2012-11-17 ENCOUNTER — Other Ambulatory Visit: Payer: Self-pay | Admitting: Obstetrics & Gynecology

## 2012-11-17 DIAGNOSIS — R971 Elevated cancer antigen 125 [CA 125]: Secondary | ICD-10-CM

## 2012-11-18 ENCOUNTER — Other Ambulatory Visit: Payer: Medicare Other

## 2012-11-20 ENCOUNTER — Inpatient Hospital Stay
Admission: RE | Admit: 2012-11-20 | Discharge: 2012-11-20 | Disposition: A | Discharge status: Home / Self Care | Payer: Medicare Other | Source: Ambulatory Visit | Attending: Obstetrics & Gynecology | Admitting: Obstetrics & Gynecology

## 2012-11-21 ENCOUNTER — Telehealth: Payer: Self-pay | Admitting: Pulmonary Disease

## 2012-11-21 NOTE — Telephone Encounter (Signed)
On Avelox for sinus infection, day 7 out of 10.  Feels unwell / weak, reports always feels this way with Avelox.  Also several episodes paroxysmal cough a day, but non-productive.  No fevers.  Would stop Avelox.  Patient will call office on Monday.  Appointment scheduled for Wednesday.

## 2012-11-25 ENCOUNTER — Ambulatory Visit (INDEPENDENT_AMBULATORY_CARE_PROVIDER_SITE_OTHER): Payer: Medicare Other | Admitting: Critical Care Medicine

## 2012-11-25 ENCOUNTER — Encounter: Payer: Self-pay | Admitting: Critical Care Medicine

## 2012-11-25 VITALS — BP 112/70 | HR 77 | Temp 98.0°F | Ht 61.5 in | Wt 106.0 lb

## 2012-11-25 DIAGNOSIS — J479 Bronchiectasis, uncomplicated: Secondary | ICD-10-CM | POA: Diagnosis not present

## 2012-11-25 DIAGNOSIS — J329 Chronic sinusitis, unspecified: Secondary | ICD-10-CM | POA: Diagnosis not present

## 2012-11-25 NOTE — Assessment & Plan Note (Signed)
History of chronic sinusitis which tends to exacerbate lower lung disease Recent acute sinusitis now resolved status post Avelox therapy No additional antibiotics indicated

## 2012-11-25 NOTE — Patient Instructions (Addendum)
No further antibiotics No change in medications Return as needed Let me know if surgery is needed

## 2012-11-25 NOTE — Progress Notes (Signed)
Subjective:    Patient ID: Monica [name], female    DOB: 06-22-1945, 67 y.o.   MRN: [id number]  HPI  This is a 67 y.o.    white female with bronchiectasis in the right middle lobe, chronic sinusitis and chronic rhinitis.    11/25/2012 Chief Complaint  Patient presents with  . Follow-up    took 8 doses of avelox.  Still has clear nasal drainage and nonprod cough.  No SOB, chest tightness, chest pain, wheezing, or fever.   Pt called on 7/14: T101, yellow mucus.  Rx avelox x 8d, had to stop early d/t nausea. Now: no fever, still with clear drainage, uses neti pot bid No dyspnea or chest tightness, Occ cough. No sinus pressure.     Review of Systems  Constitutional:   No  weight loss, night sweats,  Fevers, chills, fatigue, lassitude. HEENT:   No headaches,  Difficulty swallowing,  Tooth/dental problems,  Sore throat,                No sneezing, itching, ear ache,  +nasal congestion, post nasal drip,   CV:  Notes  chest pain,  Orthopnea, PND, swelling in lower extremities, anasarca, dizziness, palpitations  GI  No   heartburn, indigestion, abdominal pain, nausea, vomiting, diarrhea, change in bowel habits, loss of appetite  Resp:   No wheezing.  No chest wall deformity  Skin: no rash or lesions.  GU: no dysuria, change in color of urine, no urgency or frequency.  No flank pain.  MS:  No joint pain or swelling.  No decreased range of motion.  No back pain.  Psych:  No change in mood or affect. No depression or anxiety.  No memory loss.     Objective:   Physical Exam  Filed Vitals:   11/25/12 1556  BP: 112/70  Pulse: 77  Temp: 98 F (36.7 C)  TempSrc: Oral  Height: 5' 1.5" (1.562 m)  Weight: 106 lb (48.081 kg)  SpO2: 98%    Gen: Pleasant, well-nourished, in no distress,  normal affect  ENT: No lesions,  mouth clear,  oropharynx clear, ++clear  postnasal drip., nontender sinus   Neck: No JVD, no TMG, no carotid bruits  Lungs: No use of accessory muscles, no  dullness to percussion, clear without rales or rhonchi  Cardiovascular: RRR, heart sounds normal, no murmur or gallops, no peripheral edema  Abdomen: soft and NT, no HSM,  BS normal  Musculoskeletal: No deformities, no cyanosis or clubbing  Neuro: alert, non focal  Skin: Warm, no lesions or rashes        Assessment & Plan:   Bronchiectasis without acute exacerbation Stable right middle lobe bronchiectasis without evidence of exacerbation Maintain off inhaled medications at this time  SINUSITIS, CHRONIC History of chronic sinusitis which tends to exacerbate lower lung disease Recent acute sinusitis now resolved status post Avelox therapy No additional antibiotics indicated    Updated Medication List Outpatient Encounter Prescriptions as of 11/25/2012  Medication Sig Dispense Refill  . Calcium Carbonate (CALTRATE 600 PO) Take 1 each by mouth daily.        . Cholecalciferol (VITAMIN D) 1000 UNITS capsule Take 1,000 Units by mouth daily.        . Estradiol 10 MCG TABS Place 1 tablet vaginally. Two times weekly.       . fluticasone (FLONASE) 50 MCG/ACT nasal spray Place 2 sprays into the nose daily.      . Guaifenesin (MUCINEX MAXIMUM STRENGTH) 1200 MG  TB12 Take 1 tablet by mouth every 12 (twelve) hours as needed.      . loratadine (CLARITIN) 10 MG tablet Take 10 mg by mouth daily.       . methylcellulose (CITRUCEL) oral powder Take by mouth daily as needed. One tablespoon.       . Multiple Vitamins-Minerals (MULTIVITAMIN WITH MINERALS) tablet Take 1 tablet by mouth daily.        . mupirocin (BACTROBAN) 2 % ointment Apply 1 application topically 2 (two) times daily as needed.       Marland Kitchen omeprazole (PRILOSEC OTC) 20 MG tablet Take 20 mg by mouth daily as needed.      . Probiotic Product (SUPER PROBIOTIC) CAPS Take 1 capsule by mouth daily.        . ranitidine (ZANTAC) 150 MG capsule Take 150 mg by mouth daily as needed for heartburn.      . temazepam (RESTORIL) 15 MG capsule Take 1  capsule (15 mg total) by mouth at bedtime as needed for sleep.  30 capsule  2  . vitamin C (ASCORBIC ACID) 500 MG tablet Take 500 mg by mouth daily.        . [DISCONTINUED] nabumetone (RELAFEN) 750 MG tablet Take 1 tablet (750 mg total) by mouth 2 (two) times daily.  60 tablet  2  . [DISCONTINUED] ondansetron (ZOFRAN) 4 MG tablet Take 1 tablet (4 mg total) by mouth every 8 (eight) hours as needed for nausea.  20 tablet  2  . [DISCONTINUED] promethazine (PHENERGAN) 12.5 MG tablet Take 1 tablet (12.5 mg total) by mouth every 8 (eight) hours as needed for nausea.  20 tablet  1   No facility-administered encounter medications on file as of 11/25/2012.

## 2012-11-25 NOTE — Assessment & Plan Note (Signed)
Stable right middle lobe bronchiectasis without evidence of exacerbation Maintain off inhaled medications at this time

## 2012-11-26 ENCOUNTER — Ambulatory Visit
Admission: RE | Admit: 2012-11-26 | Discharge: 2012-11-26 | Disposition: A | Discharge status: Home / Self Care | Payer: Medicare Other | Source: Ambulatory Visit | Attending: Obstetrics & Gynecology | Admitting: Obstetrics & Gynecology

## 2012-11-26 DIAGNOSIS — K5730 Diverticulosis of large intestine without perforation or abscess without bleeding: Secondary | ICD-10-CM | POA: Diagnosis not present

## 2012-11-26 DIAGNOSIS — R971 Elevated cancer antigen 125 [CA 125]: Secondary | ICD-10-CM

## 2012-11-26 MED ORDER — IOHEXOL 300 MG/ML  SOLN
100.0000 mL | Freq: Once | INTRAMUSCULAR | Status: AC | PRN
  Administered 2012-11-26: 100 mL via INTRAVENOUS

## 2012-11-30 ENCOUNTER — Encounter: Payer: Self-pay | Admitting: Gynecology

## 2012-11-30 ENCOUNTER — Ambulatory Visit: Payer: Medicare Other | Attending: Gynecology | Admitting: Gynecology

## 2012-11-30 VITALS — BP 98/60 | HR 64 | Temp 98.0°F | Resp 16 | Ht 62.17 in | Wt 103.1 lb

## 2012-11-30 DIAGNOSIS — N83209 Unspecified ovarian cyst, unspecified side: Secondary | ICD-10-CM | POA: Insufficient documentation

## 2012-11-30 DIAGNOSIS — K219 Gastro-esophageal reflux disease without esophagitis: Secondary | ICD-10-CM | POA: Diagnosis not present

## 2012-11-30 DIAGNOSIS — J479 Bronchiectasis, uncomplicated: Secondary | ICD-10-CM | POA: Diagnosis not present

## 2012-11-30 DIAGNOSIS — M810 Age-related osteoporosis without current pathological fracture: Secondary | ICD-10-CM | POA: Insufficient documentation

## 2012-11-30 DIAGNOSIS — R971 Elevated cancer antigen 125 [CA 125]: Secondary | ICD-10-CM | POA: Diagnosis not present

## 2012-11-30 NOTE — Patient Instructions (Signed)
Please have Dr. Juliene Pina he repeat CA 125 ascites every 6 months until we established an trend.

## 2012-11-30 NOTE — Progress Notes (Signed)
Consult Note: Gyn-Onc   Monica [name] 67 y.o. female  Chief Complaint  Patient presents with  . 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 I believe is most likely secondary to the patient's pulmonary disease.  Plan: Would recommend repeating CA 125 at six-month intervals to see whether a trend to be established. Certainly this may be chronically elevated secondary to her underlying pulmonary disease.     HPI: 67 year old white married female seen in consultation at the request of Dr. Juliene Pina regarding an elevated CA 125. Apparently, approximately 2 years ago, patient was found to have ovarian cyst on ultrasound. At that time a CA 125 was obtained which was 37.1 units per mL (01/21/2011). Apparently the cyst is however a repeat CA 125 on 11/13/2012 was 48 units per mL. Subsequently the patient has had a total body CT scan which aside from some small nodular densities in the right lower lobe (which are stable since 2010) there are no other abnormal anatomic findings.  Patient does not have any past history of gynecologic problems and has no family history of breast or ovarian cancer. She does have a diagnosis of chronic bronchiectasis.  Review of Systems:10 point review of systems is negative except as noted in interval history.   Vitals: Blood pressure 98/60, pulse 64, temperature 98 F (36.7 C), resp. rate 16, height 5' 2.17" (1.579 m), weight 103 lb 1.6 oz (46.766 kg).  Physical Exam:  The patient's CT scan laboratory work and records are reviewed. Face-to-face interview with the patient and her husband for approximately 20 minutes explaining the pros and cons of CA 125.     Allergies  Allergen Reactions  . Avelox (Moxifloxacin Hcl In Nacl) Nausea Only  . Amoxicillin-Pot Clavulanate     REACTION: GI Upset  . Bactrim (Sulfamethoxazole W-Trimethoprim)     flushing  . Benzonatate     REACTION: felt bad, out of sorts,  disoriented.  . Ciprofloxacin     REACTION: rash  . Sulfonamide Derivatives     Past Medical History  Diagnosis Date  . Allergic rhinitis   . GERD (gastroesophageal reflux disease)   . Menopausal disorder   . Insomnia   . Hemorrhoids   . Bronchiectasis   . Hearing loss   . TMJ syndrome   . Palpitations   . Urticaria   . Chronic sinusitis   . Osteoporosis     Past Surgical History  Procedure Laterality Date  . Tympanostomy    . Nasal sinus surgery      Current Outpatient Prescriptions  Medication Sig Dispense Refill  . Calcium Carbonate (CALTRATE 600 PO) Take 1 each by mouth daily.        . Cholecalciferol (VITAMIN D) 1000 UNITS capsule Take 1,000 Units by mouth daily.        . fluticasone (FLONASE) 50 MCG/ACT nasal spray Place 2 sprays into the nose daily.      . Guaifenesin (MUCINEX MAXIMUM STRENGTH) 1200 MG TB12 Take 1 tablet by mouth every 12 (twelve) hours as needed.      . loratadine (CLARITIN) 10 MG tablet Take 10 mg by mouth daily.       . methylcellulose (CITRUCEL) oral powder Take by mouth daily as needed. One tablespoon.       . Multiple Vitamins-Minerals (MULTIVITAMIN WITH MINERALS) tablet Take 1 tablet by mouth daily.        . mupirocin (BACTROBAN) 2 %  ointment Apply 1 application topically 2 (two) times daily as needed.       Marland Kitchen omeprazole (PRILOSEC OTC) 20 MG tablet Take 20 mg by mouth daily as needed.      . Probiotic Product (SUPER PROBIOTIC) CAPS Take 1 capsule by mouth daily.        . ranitidine (ZANTAC) 150 MG capsule Take 150 mg by mouth daily as needed for heartburn.      . temazepam (RESTORIL) 15 MG capsule Take 1 capsule (15 mg total) by mouth at bedtime as needed for sleep.  30 capsule  2  . vitamin C (ASCORBIC ACID) 500 MG tablet Take 500 mg by mouth daily.        . Estradiol 10 MCG TABS Place 1 tablet vaginally. Two times weekly.        No current facility-administered medications for this visit.    History   Social History  . Marital  Status: Married    Spouse Name: N/A    Number of Children: 0  . Years of Education: N/A   Occupational History  . retired [employer] fundraiser    Social History Main Topics  . Smoking status: Never Smoker   . Smokeless tobacco: Never Used  . Alcohol Use: 1.0 oz/week    2 drink(s) per week  . Drug Use: No  . Sexually Active: Not on file   Other Topics Concern  . Not on file   Social History Narrative  . No narrative on file    Family History  Problem Relation Age of Onset  . Rectal cancer Mother     died age 50  . Seizures Mother   . Heart disease Father     dies age 86  . Cancer Maternal Grandfather   . Cancer Paternal Uncle     multiple uncles with cancer      Jeannette Corpus, MD 11/30/2012, 12:18 PM

## 2012-12-09 ENCOUNTER — Other Ambulatory Visit: Payer: Self-pay

## 2012-12-21 DIAGNOSIS — M25579 Pain in unspecified ankle and joints of unspecified foot: Secondary | ICD-10-CM | POA: Diagnosis not present

## 2013-01-12 DIAGNOSIS — H6980 Other specified disorders of Eustachian tube, unspecified ear: Secondary | ICD-10-CM | POA: Diagnosis not present

## 2013-01-12 DIAGNOSIS — H905 Unspecified sensorineural hearing loss: Secondary | ICD-10-CM | POA: Diagnosis not present

## 2013-01-25 DIAGNOSIS — M545 Low back pain: Secondary | ICD-10-CM | POA: Diagnosis not present

## 2013-02-04 ENCOUNTER — Ambulatory Visit (INDEPENDENT_AMBULATORY_CARE_PROVIDER_SITE_OTHER): Payer: Medicare Other

## 2013-02-04 DIAGNOSIS — Z23 Encounter for immunization: Secondary | ICD-10-CM | POA: Diagnosis not present

## 2013-02-16 ENCOUNTER — Ambulatory Visit (INDEPENDENT_AMBULATORY_CARE_PROVIDER_SITE_OTHER): Payer: Medicare Other | Admitting: Internal Medicine

## 2013-02-16 ENCOUNTER — Other Ambulatory Visit (INDEPENDENT_AMBULATORY_CARE_PROVIDER_SITE_OTHER): Payer: Medicare Other

## 2013-02-16 ENCOUNTER — Encounter: Payer: Self-pay | Admitting: Internal Medicine

## 2013-02-16 VITALS — BP 90/64 | HR 85 | Temp 97.0°F | Ht 62.0 in | Wt 103.0 lb

## 2013-02-16 DIAGNOSIS — K219 Gastro-esophageal reflux disease without esophagitis: Secondary | ICD-10-CM

## 2013-02-16 DIAGNOSIS — Z23 Encounter for immunization: Secondary | ICD-10-CM

## 2013-02-16 DIAGNOSIS — R971 Elevated cancer antigen 125 [CA 125]: Secondary | ICD-10-CM | POA: Diagnosis not present

## 2013-02-16 DIAGNOSIS — Z0000 Encounter for general adult medical examination without abnormal findings: Secondary | ICD-10-CM

## 2013-02-16 DIAGNOSIS — H9190 Unspecified hearing loss, unspecified ear: Secondary | ICD-10-CM | POA: Diagnosis not present

## 2013-02-16 LAB — CBC WITH DIFFERENTIAL/PLATELET
Basophils Relative: 0.6 % (ref 0.0–3.0)
Eosinophils Absolute: 0.1 10*3/uL (ref 0.0–0.7)
Eosinophils Relative: 1.8 % (ref 0.0–5.0)
HCT: 42.8 % (ref 36.0–46.0)
Lymphs Abs: 1.7 10*3/uL (ref 0.7–4.0)
MCHC: 33.8 g/dL (ref 30.0–36.0)
MCV: 91.1 fl (ref 78.0–100.0)
Monocytes Absolute: 0.7 10*3/uL (ref 0.1–1.0)
Neutrophils Relative %: 67.5 % (ref 43.0–77.0)
Platelets: 320 10*3/uL (ref 150.0–400.0)
RBC: 4.7 Mil/uL (ref 3.87–5.11)

## 2013-02-16 LAB — HEPATIC FUNCTION PANEL
ALT: 17 U/L (ref 0–35)
AST: 16 U/L (ref 0–37)
Alkaline Phosphatase: 55 U/L (ref 39–117)
Bilirubin, Direct: 0.1 mg/dL (ref 0.0–0.3)
Total Bilirubin: 0.9 mg/dL (ref 0.3–1.2)

## 2013-02-16 LAB — COMPREHENSIVE METABOLIC PANEL
CO2: 28 mEq/L (ref 19–32)
Calcium: 9.3 mg/dL (ref 8.4–10.5)
Chloride: 102 mEq/L (ref 96–112)
Creatinine, Ser: 0.7 mg/dL (ref 0.4–1.2)
GFR: 93.05 mL/min (ref >60.00)
Glucose, Bld: 97 mg/dL (ref 70–99)
Total Bilirubin: 0.9 mg/dL (ref 0.3–1.2)

## 2013-02-16 LAB — LIPID PANEL
Cholesterol: 185 mg/dL (ref 0–200)
HDL: 59.3 mg/dL (ref >39.00)
VLDL: 17 mg/dL (ref 0.0–40.0)

## 2013-02-16 NOTE — Assessment & Plan Note (Addendum)
Recently saw GYN-ONC and had an abdominal and  pelvic CT. Also has had a prior TV-US which showed an ovarian cyst, but no evidence of a tumor. Dr. Stanford Breed suggested that the most likely etiology of the elevated CA-125 is her chronic pulmonary disease. No further testing is recommended today, and the patient was reassured. Further follow-up of CA-125 may be recommended by Dr. Stanford Breed.

## 2013-02-16 NOTE — Assessment & Plan Note (Addendum)
Screening TSH, lipid panel, CMP, and CBC today. Will also give Prevnar 13.   Lipids: Total 185, LDL 109, HDL 59, TGs 85. Given no history of diabetes, likely would not benefit from statin or low dose daily aspirin at this time.   TSH: 2.75  CBC: WNL  CMP: WNL, glucose of 97.

## 2013-02-16 NOTE — Patient Instructions (Signed)
Good to see you.  Your exam is normal, including the thyroid exam.  In regard to CA-125 : not recommended as a screening test. I will check on the association with pulmonary inflammation. You have had the definitive test which is transvaginal U/S. Repeat studies of discretion of your gyn.  Health maintenance - will give you Prevnar 13 is a new pneumonia recommend along with the polysaccharide vaccine you have already had.  General labs today with results on MyChart

## 2013-02-16 NOTE — Assessment & Plan Note (Signed)
02/16/13: Weber lateralizes to right ear suggesting left sensorineural hearing loss; however Rinne indicates that bone conduction > air conduction in the left, suggesting left conductive hearing loss. Patient is followed by ENT with Dr. [name]. Patient has not had audiology testing in several years, but reports that Dr. [name] follows it every few years since the degree of hearing loss is stable.

## 2013-02-16 NOTE — Progress Notes (Signed)
Subjective:     Patient ID: Monica [name], female   DOB: 23-Feb-1946, 67 y.o.   MRN: [id number]  HPI The patient is here for annual Medicare wellness examination and management of other chronic and acute problems. She has recently seen a GYN-ONC doctor who she was referred to by her GYN for a rising Ca-125 over the past year. A CT showed no concerning lesions, but they plan to follow-up on the Ca-125 every 6 months. She is also troubled by  Allergies and has chronic cylindrical bronchiectasis and sees a pulmonologist (Dr. Delford Field). She also reports that she has had many xrays for dental problems and is concerned about thyroid cancer.    The risk factors are reflected in the social history.  The roster of all physicians providing medical care to patient - is listed in the Snapshot section of the chart.  Activities of daily living:  The patient is 100% inedpendent in all ADLs: dressing, toileting, feeding as well as independent mobility. She lives with her husband independently.   Home safety : The patient has smoke detectors in the home. They wear seatbelts. No firearms at home. There is no violence in the home.   There is no risks for hepatitis, STDs or HIV. There is no history of blood transfusion. They have no travel history to infectious disease endemic areas of the world. She has traveled in [place], [place], [place], [place], [place] and [place]. 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   The patient has seen their dentist in the last six month. They have not seen their eye doctor in the last year. They have some incomplete hearing loss in her left ear and has tinnitus which is followed by ENT. She has a tympanostomy tube in her left ear. No increased hearing loss over baseline. They do not  have excessive sun exposure. Discussed the need for sun protection: hats, long sleeves and use of sunscreen if there is significant sun exposure.    Diet: the importance of a healthy diet is discussed. They do have a healthy diet.   The patient has a regular exercise program:tai chi , 1 hour duration, 2-3 x per week. She sometimes does tai chi exercises between the classes.  The benefits of regular aerobic exercise were discussed.  Depression screen: there are no signs or vegative symptoms of depression- irritability, change in appetite, anhedonia, sadness/tearfullness.  Cognitive assessment: the patient manages all their financial and personal affairs and is actively engaged. They could relate day,date,year and events; recalled 3/3 objects at 3 minutes; performed clock-face test normally.  The following portions of the patient's history were reviewed and updated as appropriate: allergies, current medications, past family history, past medical history,  past surgical history, past social history  and problem list.  Vision, hearing, body mass index were assessed and reviewed.   During the course of the visit the patient was educated and counseled about appropriate screening and preventive services including : fall prevention , diabetes screening, nutrition counseling, colorectal cancer screening, and recommended immunizations. Her last colonoscopy was within the past 3-4 years. She receives colonoscopies every 5 years since her mother had CRC. Her last mammogram was in November 2013. She typically gets mammograms yearly because of some benign changes seen on a mammogram when she was in her 65s. She has had her annual flu shot. Her last pneumovax was in 2010.    Review of  Systems Constitutional:  Negative for fever, chills, activity change and unexpected weight change.  HEENT:  Negative for neck stiffness and postnasal drip. Negative for sore throat or swallowing problems. Negative for dental complaints.  Intermittent allergies, sinus HA, congestion, and ear pain--takes some claritin, flonase, and occasional sudafed.  Eyes: Negative for vision  loss or change in visual acuity.  Respiratory: Negative for chest tightness and wheezing. Negative for DOE.   Cardiovascular: Negative for chest pain or palpitations. No decreased exercise tolerance. Has benign PVCs and feels some palpitations occasionally. Saw a cardiologist at the onset of this problem, but chose not to initiate a beta-blocker.  Gastrointestinal: No change in bowel habit. No bloating or gas. No  indigestion. Takes zanac or prilosec OTC for reflux. She d/c'd taking prilosec daily due to concerns about exacerbating her osteoporosis.  Genitourinary: Negative for urgency, frequency, flank pain and difficulty urinating.  Musculoskeletal: Negative for myalgias and gait problem. Right knee pain after exertion. Some neck pains over trapezius. Takes ibuprofen prn.  Neurological: Negative for dizziness, tremors, weakness.  Hematological: Negative for adenopathy.  Psychiatric/Behavioral: Negative for behavioral problems and dysphoric mood.   Past Medical History  Diagnosis Date  . Allergic rhinitis   . GERD (gastroesophageal reflux disease)   . Menopausal disorder   . Insomnia   . Hemorrhoids   . Bronchiectasis   . Hearing loss   . TMJ syndrome   . Palpitations   . Urticaria   . Chronic sinusitis   . Osteoporosis        Objective:   Physical Exam Filed Vitals:   02/16/13 0904  BP: 90/64  Pulse: 85  Temp: 97 F (36.1 C)   Wt Readings from Last 3 Encounters:  02/16/13 103 lb (46.72 kg)  11/30/12 103 lb 1.6 oz (46.766 kg)  11/25/12 106 lb (48.081 kg)   Gen'l: well nourished, well developed  woman in no distress HEENT - [place]/AT, EACs/TMs normal, Right ear with tympanostomy tube in place oropharynx with native dentition in good condition, no buccal or palatal lesions, posterior pharynx clear, mucous membranes moist. C&S clear, PERRLA, fundi - normal Neck - supple, no thyromegaly Nodes- negative submental, cervical, supraclavicular regions Chest - no deformity, no  CVAT Lungs - cleat without rales, wheezes. No increased work of breathing Breast - deferred to gyn Cardiovascular - regular rate and rhythm, quiet precordium, no murmurs, rubs or gallops, 2+ radial, DP and PT pulses Abdomen - BS+ x 4, no HSM, no guarding or rebound or tenderness Pelvic - deferred to gyn Rectal - deferred to gyn Extremities - no clubbing, cyanosis, edema or deformity.  Neuro - A&O x 3, CN II-XII normal--some hearing loss in left ear. Weber lateralizes to right ear. Rinne is normal on right but air conduction is poor on left. Motor strength normal and equal, DTRs 2+ and symmetrical biceps, and patellar tendons. Cerebellar - no tremor, no rigidity, fluid movement and normal gait. Derm - Head, neck, back, abdomen and extremities without suspicious lesions Current Outpatient Prescriptions on File Prior to Visit  Medication Sig Dispense Refill  . Calcium Carbonate (CALTRATE 600 PO) Take 1 each by mouth daily.        . Cholecalciferol (VITAMIN D) 1000 UNITS capsule Take 1,000 Units by mouth daily.        . Estradiol 10 MCG TABS Place 1 tablet vaginally. Two times weekly.       . fluticasone (FLONASE) 50 MCG/ACT nasal spray Place 2 sprays into the nose daily.      Marland Kitchen  Guaifenesin (MUCINEX MAXIMUM STRENGTH) 1200 MG TB12 Take 1 tablet by mouth every 12 (twelve) hours as needed.      . loratadine (CLARITIN) 10 MG tablet Take 10 mg by mouth daily.       . methylcellulose (CITRUCEL) oral powder Take by mouth daily as needed. One tablespoon.       . Multiple Vitamins-Minerals (MULTIVITAMIN WITH MINERALS) tablet Take 1 tablet by mouth daily.        . mupirocin (BACTROBAN) 2 % ointment Apply 1 application topically 2 (two) times daily as needed.       Marland Kitchen omeprazole (PRILOSEC OTC) 20 MG tablet Take 20 mg by mouth daily as needed.      . Probiotic Product (SUPER PROBIOTIC) CAPS Take 1 capsule by mouth daily.        . ranitidine (ZANTAC) 150 MG capsule Take 150 mg by mouth daily as needed for  heartburn.      . temazepam (RESTORIL) 15 MG capsule Take 1 capsule (15 mg total) by mouth at bedtime as needed for sleep.  30 capsule  2  . vitamin C (ASCORBIC ACID) 500 MG tablet Take 500 mg by mouth daily.         No current facility-administered medications on file prior to visit.      Assessment:     Ms. Bowery is a 67 year old woman with a history of osteoporosis, GERD, IBS chronic sinusitis, hearing loss, TMJ syndrome, and anxiety who presents for management of her chronic problems.     Plan:     See plan by problem list.

## 2013-02-16 NOTE — Assessment & Plan Note (Signed)
Patient feels this is well-controlled by taking omeprazole or ranitidine prn. She is not interested in taking daily PPIs due to concerns about exacerbating her osteoporosis.

## 2013-02-26 DIAGNOSIS — J310 Chronic rhinitis: Secondary | ICD-10-CM | POA: Diagnosis not present

## 2013-02-26 DIAGNOSIS — H6980 Other specified disorders of Eustachian tube, unspecified ear: Secondary | ICD-10-CM | POA: Diagnosis not present

## 2013-03-17 DIAGNOSIS — J310 Chronic rhinitis: Secondary | ICD-10-CM | POA: Diagnosis not present

## 2013-03-17 DIAGNOSIS — H60339 Swimmer's ear, unspecified ear: Secondary | ICD-10-CM | POA: Diagnosis not present

## 2013-03-29 DIAGNOSIS — Z1231 Encounter for screening mammogram for malignant neoplasm of breast: Secondary | ICD-10-CM | POA: Diagnosis not present

## 2013-04-15 ENCOUNTER — Encounter: Payer: Self-pay | Admitting: Internal Medicine

## 2013-05-11 DIAGNOSIS — N912 Amenorrhea, unspecified: Secondary | ICD-10-CM | POA: Diagnosis not present

## 2013-05-11 DIAGNOSIS — N8300 Follicular cyst of ovary, unspecified side: Secondary | ICD-10-CM | POA: Diagnosis not present

## 2013-05-11 DIAGNOSIS — R971 Elevated cancer antigen 125 [CA 125]: Secondary | ICD-10-CM | POA: Diagnosis not present

## 2013-05-23 ENCOUNTER — Encounter: Payer: Self-pay | Admitting: Internal Medicine

## 2013-07-10 DIAGNOSIS — J019 Acute sinusitis, unspecified: Secondary | ICD-10-CM | POA: Diagnosis not present

## 2013-07-10 DIAGNOSIS — Z881 Allergy status to other antibiotic agents status: Secondary | ICD-10-CM | POA: Diagnosis not present

## 2013-07-10 DIAGNOSIS — Z882 Allergy status to sulfonamides status: Secondary | ICD-10-CM | POA: Diagnosis not present

## 2013-07-15 ENCOUNTER — Encounter: Payer: Self-pay | Admitting: Critical Care Medicine

## 2013-07-15 ENCOUNTER — Ambulatory Visit (INDEPENDENT_AMBULATORY_CARE_PROVIDER_SITE_OTHER): Payer: Medicare Other | Admitting: Critical Care Medicine

## 2013-07-15 VITALS — BP 118/74 | HR 89 | Temp 97.1°F | Ht 62.0 in | Wt 104.6 lb

## 2013-07-15 DIAGNOSIS — J479 Bronchiectasis, uncomplicated: Secondary | ICD-10-CM

## 2013-07-15 MED ORDER — PREDNISONE 10 MG PO TABS: ORAL_TABLET | ORAL | Status: DC

## 2013-07-15 MED ORDER — AZITHROMYCIN 250 MG PO TABS: ORAL_TABLET | ORAL | Status: DC

## 2013-07-15 NOTE — Patient Instructions (Addendum)
Stop the avelox and start zpak.   Take prednisone 10 mg take 4 x 2 days, 3 x 2 days, 2 x 2 days, 1 x 2 days, then stop Meds were sent to Pharmacy Stay on Flonase Follow up with Dr. Joya Gaskins in 6 wks

## 2013-07-15 NOTE — Progress Notes (Signed)
Subjective:    Patient ID: Monica [name], female    DOB: Nov 12, 1945, 68 y.o.   MRN: [id number]  HPI  This is a 68 y.o.    white female with bronchiectasis in the right middle lobe, chronic sinusitis and chronic rhinitis.     07/16/2013 Chief Complaint  Patient presents with  . Acute Visit    nasal drainage with thick, clear mucus, prod cough with light yellow phelgm, and slight HA.  Symptoms started on x 10 days ago.  Started Avelox - will finish today.  symptoms: more cough, phlegm, more congestion, yellow mucus, pale yellow Notes avelox min help.  Pt had avelox on hand, 10doses given  Still symptomatic.   Last weekend Rx decadron    Review of Systems  Constitutional:   No  weight loss, night sweats,  Fevers, chills, fatigue, lassitude. HEENT:   No headaches,  Difficulty swallowing,  Tooth/dental problems,  Sore throat,                No sneezing, itching, ear ache,  +nasal congestion, post nasal drip,   CV:  Notes  chest pain,  Orthopnea, PND, swelling in lower extremities, anasarca, dizziness, palpitations  GI  No   heartburn, indigestion, abdominal pain, nausea, vomiting, diarrhea, change in bowel habits, loss of appetite  Resp:   No wheezing.  No chest wall deformity  Skin: no rash or lesions.  GU: no dysuria, change in color of urine, no urgency or frequency.  No flank pain.  MS:  No joint pain or swelling.  No decreased range of motion.  No back pain.  Psych:  No change in mood or affect. No depression or anxiety.  No memory loss.     Objective:   Physical Exam  Filed Vitals:   07/15/13 1201  BP: 118/74  Pulse: 89  Temp: 97.1 F (36.2 C)  TempSrc: Oral  Height: 5\' 2"  (1.575 m)  Weight: 104 lb 9.6 oz (47.446 kg)  SpO2: 100%    Gen: Pleasant, well-nourished, in no distress,  normal affect  ENT: No lesions,  mouth clear,  oropharynx clear, ++clear  postnasal drip., nontender sinus   Neck: No JVD, no TMG, no carotid bruits  Lungs: No use of  accessory muscles, no dullness to percussion, clear without rales or rhonchi  Cardiovascular: RRR, heart sounds normal, no murmur or gallops, no peripheral edema  Abdomen: soft and NT, no HSM,  BS normal  Musculoskeletal: No deformities, no cyanosis or clubbing  Neuro: alert, non focal  Skin: Warm, no lesions or rashes        Assessment & Plan:   Bronchiectasis without acute exacerbation RML bronchiectasis and now sinusitis flare Plan Take prednisone 10 mg take 4 x 2 days, 3 x 2 days, 2 x 2 days, 1 x 2 days, then stop Meds were sent to Pharmacy Stay on Flonase Follow up with Dr. Joya Gaskins in 6 wks     Updated Medication List Outpatient Encounter Prescriptions as of 07/15/2013  Medication Sig  . Calcium Carbonate (CALTRATE 600 PO) Take 1 each by mouth daily.    . Cholecalciferol (VITAMIN D) 1000 UNITS capsule Take 1,000 Units by mouth daily.    . Estradiol 10 MCG TABS Place 1 tablet vaginally. Two times weekly.   . Famotidine (PEPCID AC PO) Take by mouth at bedtime.  . fluticasone (FLONASE) 50 MCG/ACT nasal spray Place 2 sprays into the nose daily.  . Guaifenesin (MUCINEX MAXIMUM STRENGTH) 1200 MG TB12 Take 1  tablet by mouth every 12 (twelve) hours as needed.  . loratadine (CLARITIN) 10 MG tablet Take 10 mg by mouth daily.   . methylcellulose (CITRUCEL) oral powder Take by mouth daily as needed. One tablespoon.   . Multiple Vitamins-Minerals (MULTIVITAMIN WITH MINERALS) tablet Take 1 tablet by mouth daily.    . mupirocin (BACTROBAN) 2 % ointment Apply 1 application topically 2 (two) times daily as needed.   Marland Kitchen omeprazole (PRILOSEC OTC) 20 MG tablet Take 20 mg by mouth daily as needed.  . Probiotic Product (SUPER PROBIOTIC) CAPS Take 1 capsule by mouth daily.    . ranitidine (ZANTAC) 150 MG capsule Take 150 mg by mouth daily as needed for heartburn.  . temazepam (RESTORIL) 15 MG capsule Take 1 capsule (15 mg total) by mouth at bedtime as needed for sleep.  . vitamin C (ASCORBIC  ACID) 500 MG tablet Take 500 mg by mouth daily.    . [DISCONTINUED] moxifloxacin (AVELOX) 400 MG tablet Take 400 mg by mouth daily at 8 pm.  . azithromycin (ZITHROMAX) 250 MG tablet Take two once then one daily until gone  . predniSONE (DELTASONE) 10 MG tablet Take 4 x 2 days, 3 x 2 days, 2 x 2 days, 1 x 2 days, then stop

## 2013-07-16 NOTE — Assessment & Plan Note (Signed)
RML bronchiectasis and now sinusitis flare Plan Take prednisone 10 mg take 4 x 2 days, 3 x 2 days, 2 x 2 days, 1 x 2 days, then stop Meds were sent to Pharmacy Stay on Flonase Follow up with Dr. Joya Gaskins in 6 wks

## 2013-07-26 DIAGNOSIS — H905 Unspecified sensorineural hearing loss: Secondary | ICD-10-CM | POA: Diagnosis not present

## 2013-07-26 DIAGNOSIS — J309 Allergic rhinitis, unspecified: Secondary | ICD-10-CM | POA: Diagnosis not present

## 2013-07-26 DIAGNOSIS — H6980 Other specified disorders of Eustachian tube, unspecified ear: Secondary | ICD-10-CM | POA: Diagnosis not present

## 2013-07-26 DIAGNOSIS — H608X9 Other otitis externa, unspecified ear: Secondary | ICD-10-CM | POA: Diagnosis not present

## 2013-08-12 ENCOUNTER — Encounter: Payer: Self-pay | Admitting: Gastroenterology

## 2013-08-27 ENCOUNTER — Ambulatory Visit: Payer: Medicare Other | Admitting: Critical Care Medicine

## 2013-09-02 ENCOUNTER — Encounter: Payer: Self-pay | Admitting: Critical Care Medicine

## 2013-09-02 ENCOUNTER — Ambulatory Visit (INDEPENDENT_AMBULATORY_CARE_PROVIDER_SITE_OTHER): Payer: Medicare Other | Admitting: Critical Care Medicine

## 2013-09-02 VITALS — BP 104/64 | HR 65 | Temp 98.2°F | Ht 62.0 in | Wt 104.0 lb

## 2013-09-02 DIAGNOSIS — J479 Bronchiectasis, uncomplicated: Secondary | ICD-10-CM | POA: Diagnosis not present

## 2013-09-02 DIAGNOSIS — J329 Chronic sinusitis, unspecified: Secondary | ICD-10-CM

## 2013-09-02 NOTE — Progress Notes (Signed)
Subjective:    Patient ID: Monica [name], female    DOB: [birth year], 68 y.o.   MRN: [id number]  HPI  This is a 68 y.o.    white female with bronchiectasis in the right middle lobe, chronic sinusitis and chronic rhinitis.   09/02/2013 Chief Complaint  Patient presents with  . 6 wk follow up    Sinus symptoms have resolved.  No complaints today.  Pt is improved.  Sinus symptoms are better.  ENT also see the pt, tried zyrtec 1/2 dose at night.  No real cough Less pndrip.  No sinus pressure. No dyspnea.  On nasal washes.        Review of Systems  Constitutional:   No  weight loss, night sweats,  Fevers, chills, fatigue, lassitude. HEENT:   No headaches,  Difficulty swallowing,  Tooth/dental problems,  Sore throat,                No sneezing, itching, ear ache,  No nasal congestion, post nasal drip,   CV:  Notes  chest pain,  Orthopnea, PND, swelling in lower extremities, anasarca, dizziness, palpitations  GI  No   heartburn, indigestion, abdominal pain, nausea, vomiting, diarrhea, change in bowel habits, loss of appetite  Resp:   No wheezing.  No chest wall deformity  Skin: no rash or lesions.  GU: no dysuria, change in color of urine, no urgency or frequency.  No flank pain.  MS:  No joint pain or swelling.  No decreased range of motion.  No back pain.  Psych:  No change in mood or affect. No depression or anxiety.  No memory loss.     Objective:   Physical Exam  Filed Vitals:   09/02/13 1132  BP: 104/64  Pulse: 65  Temp: 98.2 F (36.8 C)  TempSrc: Oral  Height: 5\' 2"  (1.575 m)  Weight: 104 lb (47.174 kg)  SpO2: 100%    Gen: Pleasant, well-nourished, in no distress,  normal affect  ENT: No lesions,  mouth clear,  oropharynx clear,   Neck: No JVD, no TMG, no carotid bruits  Lungs: No use of accessory muscles, no dullness to percussion, clear without rales or rhonchi  Cardiovascular: RRR, heart sounds normal, no murmur or gallops, no peripheral  edema  Abdomen: soft and NT, no HSM,  BS normal  Musculoskeletal: No deformities, no cyanosis or clubbing  Neuro: alert, non focal  Skin: Warm, no lesions or rashes        Assessment & Plan:   Bronchiectasis without acute exacerbation Right middle lobe bronchiectasis without acute exacerbation stable at this time Monitor  SINUSITIS, CHRONIC  Recent acute sinusitis now resolved Maintain nasal steroid    Updated Medication List Outpatient Encounter Prescriptions as of 09/02/2013  Medication Sig  . Calcium Carbonate (CALTRATE 600 PO) Take 1 each by mouth daily.    . cetirizine (ZYRTEC) 10 MG tablet Take 5 mg by mouth at bedtime.  . Cholecalciferol (VITAMIN D) 1000 UNITS capsule Take 1,000 Units by mouth daily.    . Estradiol 10 MCG TABS Place 1 tablet vaginally. Two times weekly.   . Famotidine (PEPCID AC PO) Take by mouth at bedtime as needed.   . fluticasone (FLONASE) 50 MCG/ACT nasal spray Place 2 sprays into the nose daily.  . Guaifenesin (MUCINEX MAXIMUM STRENGTH) 1200 MG TB12 Take 1 tablet by mouth every 12 (twelve) hours as needed.  . methylcellulose (CITRUCEL) oral powder Take by mouth daily as needed. One tablespoon.   Marland Kitchen  Multiple Vitamins-Minerals (MULTIVITAMIN WITH MINERALS) tablet Take 1 tablet by mouth daily.    . mupirocin (BACTROBAN) 2 % ointment Apply 1 application topically 2 (two) times daily as needed.   Marland Kitchen omeprazole (PRILOSEC OTC) 20 MG tablet Take 20 mg by mouth daily as needed.  . Probiotic Product (SUPER PROBIOTIC) CAPS Take 1 capsule by mouth daily.    . ranitidine (ZANTAC) 150 MG capsule Take 150 mg by mouth daily as needed for heartburn.  . vitamin C (ASCORBIC ACID) 500 MG tablet Take 500 mg by mouth daily.    . [DISCONTINUED] azithromycin (ZITHROMAX) 250 MG tablet Take two once then one daily until gone  . [DISCONTINUED] loratadine (CLARITIN) 10 MG tablet Take 10 mg by mouth daily.   . [DISCONTINUED] predniSONE (DELTASONE) 10 MG tablet Take 4 x 2  days, 3 x 2 days, 2 x 2 days, 1 x 2 days, then stop  . [DISCONTINUED] temazepam (RESTORIL) 15 MG capsule Take 1 capsule (15 mg total) by mouth at bedtime as needed for sleep.

## 2013-09-02 NOTE — Patient Instructions (Signed)
Return 1 year  No change in medications

## 2013-09-03 NOTE — Assessment & Plan Note (Signed)
Right middle lobe bronchiectasis without acute exacerbation stable at this time Monitor

## 2013-09-03 NOTE — Assessment & Plan Note (Signed)
Recent acute sinusitis now resolved Maintain nasal steroid

## 2013-09-07 DIAGNOSIS — Z01419 Encounter for gynecological examination (general) (routine) without abnormal findings: Secondary | ICD-10-CM | POA: Diagnosis not present

## 2013-09-07 DIAGNOSIS — Z124 Encounter for screening for malignant neoplasm of cervix: Secondary | ICD-10-CM | POA: Diagnosis not present

## 2013-11-26 ENCOUNTER — Encounter: Payer: Self-pay | Admitting: Gastroenterology

## 2013-12-24 ENCOUNTER — Ambulatory Visit (AMBULATORY_SURGERY_CENTER): Payer: Medicare Other

## 2013-12-24 VITALS — Ht 62.0 in | Wt 103.0 lb

## 2013-12-24 DIAGNOSIS — Z860100 Personal history of colon polyps, unspecified: Secondary | ICD-10-CM

## 2013-12-24 MED ORDER — MOVIPREP 100 G PO SOLR: 1.0000 | Freq: Once | ORAL | Status: DC

## 2013-12-24 NOTE — Progress Notes (Signed)
No allergies  To eggs or soy No past problems with anesthesia No home oxygen No diet/weight loss meds  Has email  Emmi instructions given for colonoscopy

## 2014-01-14 ENCOUNTER — Ambulatory Visit (AMBULATORY_SURGERY_CENTER): Payer: Medicare Other | Admitting: Gastroenterology

## 2014-01-14 ENCOUNTER — Encounter: Payer: Self-pay | Admitting: Gastroenterology

## 2014-01-14 ENCOUNTER — Telehealth: Payer: Self-pay | Admitting: Gastroenterology

## 2014-01-14 VITALS — BP 137/80 | HR 65 | Temp 97.5°F | Resp 16 | Ht 62.0 in | Wt 103.0 lb

## 2014-01-14 DIAGNOSIS — Z86010 Personal history of colonic polyps: Secondary | ICD-10-CM

## 2014-01-14 DIAGNOSIS — Z800 Family history of malignant neoplasm of digestive organs: Secondary | ICD-10-CM

## 2014-01-14 DIAGNOSIS — Z1211 Encounter for screening for malignant neoplasm of colon: Secondary | ICD-10-CM | POA: Diagnosis not present

## 2014-01-14 DIAGNOSIS — K219 Gastro-esophageal reflux disease without esophagitis: Secondary | ICD-10-CM | POA: Diagnosis not present

## 2014-01-14 MED ORDER — SODIUM CHLORIDE 0.9 % IV SOLN: 500.0000 mL | INTRAVENOUS | Status: DC

## 2014-01-14 NOTE — Telephone Encounter (Signed)
Returned patient's call and she states that she is having trouble with the prep.  She c/o feeling weak and dizzy.  She is states that she is urinating a lot also.  She is concerned about all this.  I reassured her that everyone does differently and that if she needs to, she can start a little early on the second bottle of prep.  I advised her to do the best she can and that with her drinking a lot of clear liquids and water, this can make her urinate more than usual.  I also advised her that we would evaluate her stools when she arrives for her appointment.  She understood and agreed to be here at her scheduled time 1:30 pm.

## 2014-01-14 NOTE — Op Note (Signed)
[place]  [address]. [place], [phone]   COLONOSCOPY PROCEDURE REPORT  PATIENT: Monica [name], Monica [name]  MR#: [id number] BIRTHDATE: [birth year] , 67  yrs. old GENDER: Female ENDOSCOPIST: Ladene Artist, MD, [facility] PROCEDURE DATE:  01/14/2014 PROCEDURE:   Colonoscopy, surveillance First Screening Colonoscopy - Avg.  risk and is 50 yrs.  old or older - No.  Prior Negative Screening - Now for repeat screening. N/A  History of Adenoma - Now for follow-up colonoscopy & has been > or = to 3 yrs.  Yes hx of adenoma.  Has been 3 or more years since last colonoscopy.  Polyps Removed Today? No.  Recommend repeat exam, <10 yrs? Yes.  High risk (family or personal hx). ASA CLASS:   Class II INDICATIONS:Patient's personal history of adenomatous colon polyps and Patient's immediate family history of colon cancer. MEDICATIONS: MAC sedation, administered by CRNA and propofol (Diprivan) 400mg  IV DESCRIPTION OF PROCEDURE:   After the risks benefits and alternatives of the procedure were thoroughly explained, informed consent was obtained.  A digital rectal exam revealed no abnormalities of the rectum.   The [id number]  endoscope was introduced through the anus and advanced to the cecum, which was identified by both the appendix and ileocecal valve. No adverse events experienced.   The quality of the prep was good, using MoviPrep  The instrument was then slowly withdrawn as the colon was fully examined.  COLON FINDINGS: Mild diverticulosis was noted in the descending colon and sigmoid colon.   The colon was otherwise normal.  There was no diverticulosis, inflammation, polyps or cancers unless previously stated.  Retroflexed views revealed no abnormalities. The time to cecum=2 minutes 24 seconds.  Withdrawal time=8 minutes 48 seconds.  The scope was withdrawn and the procedure completed.  COMPLICATIONS: There were no complications.  ENDOSCOPIC IMPRESSION: 1.   Mild  diverticulosis in the descending colon and sigmoid colon 2.   The colon was otherwise normal  RECOMMENDATIONS: 1.  High fiber diet with liberal fluid intake. 2.  Repeat Colonoscopy in 5 years.   eSigned:  Ladene Artist, MD, [facility] 01/14/2014 2:37 PM

## 2014-01-14 NOTE — Patient Instructions (Signed)
YOU HAD AN ENDOSCOPIC PROCEDURE TODAY AT THE [facility]: Refer to the procedure report that was given to you for any specific questions about what was found during the examination.  If the procedure report does not answer your questions, please call your gastroenterologist to clarify.  If you requested that your care partner not be given the details of your procedure findings, then the procedure report has been included in a sealed envelope for you to review at your convenience later.  YOU SHOULD EXPECT: Some feelings of bloating in the abdomen. Passage of more gas than usual.  Walking can help get rid of the air that was put into your GI tract during the procedure and reduce the bloating. If you had a lower endoscopy (such as a colonoscopy or flexible sigmoidoscopy) you may notice spotting of blood in your stool or on the toilet paper. If you underwent a bowel prep for your procedure, then you may not have a normal bowel movement for a few days.  DIET: Your first meal following the procedure should be a light meal and then it is ok to progress to your normal diet.  A half-sandwich or bowl of soup is an example of a good first meal.  Heavy or fried foods are harder to digest and may make you feel nauseous or bloated.  Likewise meals heavy in dairy and vegetables can cause extra gas to form and this can also increase the bloating.  Drink plenty of fluids but you should avoid alcoholic beverages for 24 hours.  ACTIVITY: Your care partner should take you home directly after the procedure.  You should plan to take it easy, moving slowly for the rest of the day.  You can resume normal activity the day after the procedure however you should NOT DRIVE or use heavy machinery for 24 hours (because of the sedation medicines used during the test).    SYMPTOMS TO REPORT IMMEDIATELY: A gastroenterologist can be reached at any hour.  During normal business hours, 8:30 AM to 5:00 PM Monday through Friday,  call [phone].  After hours and on weekends, please call the GI answering service at [phone] who will take a message and have the physician on call contact you.   Following lower endoscopy (colonoscopy or flexible sigmoidoscopy):  Excessive amounts of blood in the stool  Significant tenderness or worsening of abdominal pains  Swelling of the abdomen that is new, acute  Fever of 100F or higher    FOLLOW UP: If any biopsies were taken you will be contacted by phone or by letter within the next 1-3 weeks.  Call your gastroenterologist if you have not heard about the biopsies in 3 weeks.  Our staff will call the home number listed on your records the next business day following your procedure to check on you and address any questions or concerns that you may have at that time regarding the information given to you following your procedure. This is a courtesy call and so if there is no answer at the home number and we have not heard from you through the emergency physician on call, we will assume that you have returned to your regular daily activities without incident.  SIGNATURES/CONFIDENTIALITY: You and/or your care partner have signed paperwork which will be entered into your electronic medical record.  These signatures attest to the fact that that the information above on your After Visit Summary has been reviewed and is understood.  Full responsibility of the confidentiality   of this discharge information lies with you and/or your care-partner.     

## 2014-01-14 NOTE — Progress Notes (Signed)
A/ox3, pleased with MAC, report to RN 

## 2014-01-17 ENCOUNTER — Telehealth: Payer: Self-pay | Admitting: *Deleted

## 2014-01-17 NOTE — Telephone Encounter (Signed)
  Follow up Call-  Call back number 01/14/2014  Post procedure Call Back phone  # [phone]  Permission to leave phone message Yes     Patient questions:  Do you have a fever, pain , or abdominal swelling? No. Pain Score  0 *  Have you tolerated food without any problems? Yes.    Have you been able to return to your normal activities? Yes.    Do you have any questions about your discharge instructions: Diet   No. Medications  No. Follow up visit  No.  Do you have questions or concerns about your Care? No.  Actions: * If pain score is 4 or above: No action needed, pain <4.

## 2014-01-24 ENCOUNTER — Ambulatory Visit (INDEPENDENT_AMBULATORY_CARE_PROVIDER_SITE_OTHER): Payer: Medicare Other

## 2014-01-24 DIAGNOSIS — Z23 Encounter for immunization: Secondary | ICD-10-CM

## 2014-02-16 DIAGNOSIS — H9042 Sensorineural hearing loss, unilateral, left ear, with unrestricted hearing on the contralateral side: Secondary | ICD-10-CM | POA: Diagnosis not present

## 2014-02-16 DIAGNOSIS — H6981 Other specified disorders of Eustachian tube, right ear: Secondary | ICD-10-CM | POA: Diagnosis not present

## 2014-02-16 DIAGNOSIS — H6063 Unspecified chronic otitis externa, bilateral: Secondary | ICD-10-CM | POA: Diagnosis not present

## 2014-02-16 DIAGNOSIS — J309 Allergic rhinitis, unspecified: Secondary | ICD-10-CM | POA: Diagnosis not present

## 2014-03-09 DIAGNOSIS — M25562 Pain in left knee: Secondary | ICD-10-CM | POA: Diagnosis not present

## 2014-04-13 ENCOUNTER — Encounter: Payer: Self-pay | Admitting: Internal Medicine

## 2014-04-13 ENCOUNTER — Other Ambulatory Visit (INDEPENDENT_AMBULATORY_CARE_PROVIDER_SITE_OTHER): Payer: Medicare Other

## 2014-04-13 ENCOUNTER — Ambulatory Visit (INDEPENDENT_AMBULATORY_CARE_PROVIDER_SITE_OTHER): Payer: Medicare Other | Admitting: Internal Medicine

## 2014-04-13 VITALS — BP 106/70 | HR 80 | Temp 97.7°F | Ht 62.0 in | Wt 102.0 lb

## 2014-04-13 DIAGNOSIS — R0789 Other chest pain: Secondary | ICD-10-CM | POA: Diagnosis not present

## 2014-04-13 DIAGNOSIS — R079 Chest pain, unspecified: Secondary | ICD-10-CM

## 2014-04-13 DIAGNOSIS — M8580 Other specified disorders of bone density and structure, unspecified site: Secondary | ICD-10-CM

## 2014-04-13 DIAGNOSIS — F4322 Adjustment disorder with anxiety: Secondary | ICD-10-CM

## 2014-04-13 DIAGNOSIS — J301 Allergic rhinitis due to pollen: Secondary | ICD-10-CM

## 2014-04-13 DIAGNOSIS — J479 Bronchiectasis, uncomplicated: Secondary | ICD-10-CM

## 2014-04-13 DIAGNOSIS — R8290 Unspecified abnormal findings in urine: Secondary | ICD-10-CM | POA: Diagnosis not present

## 2014-04-13 DIAGNOSIS — R202 Paresthesia of skin: Secondary | ICD-10-CM

## 2014-04-13 DIAGNOSIS — K219 Gastro-esophageal reflux disease without esophagitis: Secondary | ICD-10-CM

## 2014-04-13 DIAGNOSIS — F5102 Adjustment insomnia: Secondary | ICD-10-CM

## 2014-04-13 DIAGNOSIS — R971 Elevated cancer antigen 125 [CA 125]: Secondary | ICD-10-CM

## 2014-04-13 LAB — LIPID PANEL
Cholesterol: 157 mg/dL (ref 0–200)
HDL: 65.6 mg/dL (ref >39.00)
LDL Cholesterol: 77 mg/dL (ref 0–99)
NonHDL: 91.4
TRIGLYCERIDES: 71 mg/dL (ref 0.0–149.0)
Total CHOL/HDL Ratio: 2
VLDL: 14.2 mg/dL (ref 0.0–40.0)

## 2014-04-13 LAB — CBC WITH DIFFERENTIAL/PLATELET
BASOS ABS: 0 10*3/uL (ref 0.0–0.1)
Basophils Relative: 0.5 % (ref 0.0–3.0)
EOS ABS: 0.1 10*3/uL (ref 0.0–0.7)
Eosinophils Relative: 1.3 % (ref 0.0–5.0)
HCT: 40.8 % (ref 36.0–46.0)
HEMOGLOBIN: 13.8 g/dL (ref 12.0–15.0)
LYMPHS ABS: 1.5 10*3/uL (ref 0.7–4.0)
LYMPHS PCT: 24.9 % (ref 12.0–46.0)
MCHC: 33.7 g/dL (ref 30.0–36.0)
MCV: 90.9 fl (ref 78.0–100.0)
Monocytes Absolute: 0.6 10*3/uL (ref 0.1–1.0)
Monocytes Relative: 9.9 % (ref 3.0–12.0)
NEUTROS ABS: 3.9 10*3/uL (ref 1.4–7.7)
Neutrophils Relative %: 63.4 % (ref 43.0–77.0)
PLATELETS: 364 10*3/uL (ref 150.0–400.0)
RBC: 4.49 Mil/uL (ref 3.87–5.11)
RDW: 12.8 % (ref 11.5–15.5)
WBC: 6.1 10*3/uL (ref 4.0–10.5)

## 2014-04-13 LAB — HEPATIC FUNCTION PANEL
ALK PHOS: 54 U/L (ref 39–117)
ALT: 18 U/L (ref 0–35)
AST: 19 U/L (ref 0–37)
Albumin: 4.1 g/dL (ref 3.5–5.2)
BILIRUBIN DIRECT: 0.1 mg/dL (ref 0.0–0.3)
BILIRUBIN TOTAL: 0.4 mg/dL (ref 0.2–1.2)
Total Protein: 6.5 g/dL (ref 6.0–8.3)

## 2014-04-13 LAB — URINALYSIS, ROUTINE W REFLEX MICROSCOPIC
BILIRUBIN URINE: NEGATIVE
Hgb urine dipstick: NEGATIVE
KETONES UR: NEGATIVE
Nitrite: NEGATIVE
SPECIFIC GRAVITY, URINE: 1.01 (ref 1.000–1.030)
Total Protein, Urine: NEGATIVE
Urine Glucose: NEGATIVE
pH: 7 (ref 5.0–8.0)

## 2014-04-13 LAB — VITAMIN B12: VITAMIN B 12: 730 pg/mL (ref 211–911)

## 2014-04-13 LAB — BASIC METABOLIC PANEL
BUN: 18 mg/dL (ref 6–23)
CALCIUM: 9.3 mg/dL (ref 8.4–10.5)
CO2: 30 mEq/L (ref 19–32)
Chloride: 102 mEq/L (ref 96–112)
Creatinine, Ser: 0.6 mg/dL (ref 0.4–1.2)
GFR: 97.77 mL/min (ref >60.00)
Glucose, Bld: 88 mg/dL (ref 70–99)
POTASSIUM: 4.2 meq/L (ref 3.5–5.1)
SODIUM: 138 meq/L (ref 135–145)

## 2014-04-13 LAB — VITAMIN D 25 HYDROXY (VIT D DEFICIENCY, FRACTURES): VITD: 57.02 ng/mL (ref 30.00–100.00)

## 2014-04-13 LAB — TSH: TSH: 2.81 u[IU]/mL (ref 0.35–4.50)

## 2014-04-13 MED ORDER — CALCIUM CARBONATE ANTACID 500 MG PO CHEW: 1.0000 | CHEWABLE_TABLET | Freq: Every day | ORAL | Status: DC

## 2014-04-13 MED ORDER — PAROXETINE HCL 10 MG PO TABS: 10.0000 mg | ORAL_TABLET | Freq: Every day | ORAL | Status: DC

## 2014-04-13 NOTE — Assessment & Plan Note (Addendum)
Zantac bid Dr Fuller Plan

## 2014-04-13 NOTE — Assessment & Plan Note (Signed)
Paxil 5-10 mg at hs

## 2014-04-13 NOTE — Progress Notes (Signed)
   Subjective:     HPI  New pt - former pt of Dr Linda Hedges  C/o stress w/Bob, insomnia C/o CP/GERD  Review of Systems  Constitutional: Negative for chills, activity change, appetite change, fatigue and unexpected weight change.  HENT: Negative for congestion, mouth sores and sinus pressure.   Eyes: Negative for visual disturbance.  Respiratory: Negative for cough and chest tightness.   Gastrointestinal: Negative for nausea and abdominal pain.  Genitourinary: Negative for frequency, difficulty urinating and vaginal pain.  Musculoskeletal: Negative for back pain and gait problem.  Skin: Negative for pallor and rash.  Neurological: Negative for dizziness, tremors, syncope, weakness, numbness and headaches.  Psychiatric/Behavioral: Negative for suicidal ideas, confusion, sleep disturbance, dysphoric mood and decreased concentration. The patient is nervous/anxious.        Objective:   Physical Exam  Constitutional: She appears well-developed. No distress.  HENT:  Head: Normocephalic.  Right Ear: External ear normal.  Left Ear: External ear normal.  Nose: Nose normal.  Mouth/Throat: Oropharynx is clear and moist.  Eyes: Conjunctivae are normal. Pupils are equal, round, and reactive to light. Right eye exhibits no discharge. Left eye exhibits no discharge.  Neck: Normal range of motion. Neck supple. No JVD present. No tracheal deviation present. No thyromegaly present.  Cardiovascular: Normal rate, regular rhythm and normal heart sounds.   Pulmonary/Chest: No stridor. No respiratory distress. She has no wheezes.  Abdominal: Soft. Bowel sounds are normal. She exhibits no distension and no mass. There is no tenderness. There is no rebound and no guarding.  Musculoskeletal: She exhibits no edema or tenderness.  Lymphadenopathy:    She has no cervical adenopathy.  Neurological: She displays normal reflexes. No cranial nerve deficit. She exhibits normal muscle tone. Coordination normal.    Skin: No rash noted. No erythema.  Psychiatric: She has a normal mood and affect. Her behavior is normal. Judgment and thought content normal.   Lab Results  Component Value Date   WBC 6.1 04/13/2014   HGB 13.8 04/13/2014   HCT 40.8 04/13/2014   PLT 364.0 04/13/2014   GLUCOSE 88 04/13/2014   CHOL 157 04/13/2014   TRIG 71.0 04/13/2014   HDL 65.60 04/13/2014   LDLCALC 77 04/13/2014   ALT 18 04/13/2014   AST 19 04/13/2014   NA 138 04/13/2014   K 4.2 04/13/2014   CL 102 04/13/2014   CREATININE 0.6 04/13/2014   BUN 18 04/13/2014   CO2 30 04/13/2014   TSH 2.81 04/13/2014    A complex case      Assessment & Plan:

## 2014-04-13 NOTE — Assessment & Plan Note (Signed)
   Milk free trial (no milk, ice cream, cheese and yogurt) for 4-6 weeks. OK to use almond, coconut, rice or soy milk. "Almond breeze" brand tastes good.  

## 2014-04-13 NOTE — Patient Instructions (Signed)
   Milk free trial (no milk, ice cream, cheese and yogurt) for 4-6 weeks. OK to use almond, coconut, rice or soy milk. "Almond breeze" brand tastes good.  

## 2014-04-13 NOTE — Assessment & Plan Note (Signed)
F/u w/Dr Benjie Karvonen and Dr Aldean Ast CT was ok

## 2014-04-13 NOTE — Assessment & Plan Note (Signed)
Claritn Try Milk free x 1 mo

## 2014-04-14 LAB — H. PYLORI ANTIBODY, IGG: H Pylori IgG: NEGATIVE

## 2014-04-18 DIAGNOSIS — Z1231 Encounter for screening mammogram for malignant neoplasm of breast: Secondary | ICD-10-CM | POA: Diagnosis not present

## 2014-05-03 ENCOUNTER — Encounter: Payer: Self-pay | Admitting: Internal Medicine

## 2014-05-17 DIAGNOSIS — R971 Elevated cancer antigen 125 [CA 125]: Secondary | ICD-10-CM | POA: Diagnosis not present

## 2014-05-18 ENCOUNTER — Ambulatory Visit (INDEPENDENT_AMBULATORY_CARE_PROVIDER_SITE_OTHER): Payer: Medicare Other | Admitting: Internal Medicine

## 2014-05-18 ENCOUNTER — Encounter: Payer: Self-pay | Admitting: Internal Medicine

## 2014-05-18 VITALS — BP 116/70 | HR 94 | Temp 97.8°F | Wt 101.0 lb

## 2014-05-18 DIAGNOSIS — R0789 Other chest pain: Secondary | ICD-10-CM

## 2014-05-18 DIAGNOSIS — F5102 Adjustment insomnia: Secondary | ICD-10-CM | POA: Diagnosis not present

## 2014-05-18 DIAGNOSIS — F4322 Adjustment disorder with anxiety: Secondary | ICD-10-CM | POA: Diagnosis not present

## 2014-05-18 DIAGNOSIS — K589 Irritable bowel syndrome without diarrhea: Secondary | ICD-10-CM

## 2014-05-18 DIAGNOSIS — K219 Gastro-esophageal reflux disease without esophagitis: Secondary | ICD-10-CM

## 2014-05-18 MED ORDER — RANITIDINE HCL 150 MG PO CAPS: 150.0000 mg | ORAL_CAPSULE | Freq: Two times a day (BID) | ORAL | Status: DC

## 2014-05-18 NOTE — Assessment & Plan Note (Signed)
Worse. See Rx: Zantac bid (pt declined Protonix) GI ref - Dr Fuller Plan

## 2014-05-18 NOTE — Progress Notes (Signed)
   Subjective:     HPI  New pt - former pt of Dr Linda Hedges  F/u stress w/Bob, insomnia. Pt has not started Paxil C/o CP/GERD - recurrent sx's for years  Review of Systems  Constitutional: Negative for chills, activity change, appetite change, fatigue and unexpected weight change.  HENT: Negative for congestion, mouth sores and sinus pressure.   Eyes: Negative for visual disturbance.  Respiratory: Negative for cough and chest tightness.   Gastrointestinal: Negative for nausea and abdominal pain.  Genitourinary: Negative for frequency, difficulty urinating and vaginal pain.  Musculoskeletal: Negative for back pain and gait problem.  Skin: Negative for pallor and rash.  Neurological: Negative for dizziness, tremors, syncope, weakness, numbness and headaches.  Psychiatric/Behavioral: Negative for suicidal ideas, confusion, sleep disturbance, dysphoric mood and decreased concentration. The patient is nervous/anxious.    Wt Readings from Last 3 Encounters:  05/18/14 101 lb (45.813 kg)  04/13/14 102 lb (46.267 kg)  01/14/14 103 lb (46.72 kg)   BP Readings from Last 3 Encounters:  05/18/14 116/70  04/13/14 106/70  01/14/14 137/80       Objective:   Physical Exam  Constitutional: She appears well-developed. No distress.  HENT:  Head: Normocephalic.  Right Ear: External ear normal.  Left Ear: External ear normal.  Nose: Nose normal.  Mouth/Throat: Oropharynx is clear and moist.  Eyes: Conjunctivae are normal. Pupils are equal, round, and reactive to light. Right eye exhibits no discharge. Left eye exhibits no discharge.  Neck: Normal range of motion. Neck supple. No JVD present. No tracheal deviation present. No thyromegaly present.  Cardiovascular: Normal rate, regular rhythm and normal heart sounds.   Pulmonary/Chest: No stridor. No respiratory distress. She has no wheezes.  Abdominal: Soft. Bowel sounds are normal. She exhibits no distension and no mass. There is no tenderness.  There is no rebound and no guarding.  Musculoskeletal: She exhibits no edema or tenderness.  Lymphadenopathy:    She has no cervical adenopathy.  Neurological: She displays normal reflexes. No cranial nerve deficit. She exhibits normal muscle tone. Coordination normal.  Skin: No rash noted. No erythema.  Psychiatric: She has a normal mood and affect. Her behavior is normal. Judgment and thought content normal.   Lab Results  Component Value Date   WBC 6.1 04/13/2014   HGB 13.8 04/13/2014   HCT 40.8 04/13/2014   PLT 364.0 04/13/2014   GLUCOSE 88 04/13/2014   CHOL 157 04/13/2014   TRIG 71.0 04/13/2014   HDL 65.60 04/13/2014   LDLCALC 77 04/13/2014   ALT 18 04/13/2014   AST 19 04/13/2014   NA 138 04/13/2014   K 4.2 04/13/2014   CL 102 04/13/2014   CREATININE 0.6 04/13/2014   BUN 18 04/13/2014   CO2 30 04/13/2014   TSH 2.81 04/13/2014        Assessment & Plan:

## 2014-05-18 NOTE — Assessment & Plan Note (Signed)
Continue with current prescription therapy as reflected on the Med list.  

## 2014-05-18 NOTE — Progress Notes (Signed)
Pre visit review using our clinic review tool, if applicable. No additional management support is needed unless otherwise documented below in the visit note. 

## 2014-05-19 NOTE — Assessment & Plan Note (Signed)
Start Paxil 

## 2014-05-19 NOTE — Assessment & Plan Note (Signed)
Likely GERD related Pt declined a stress test Increase Zantac to bid Start Paxil

## 2014-05-24 ENCOUNTER — Encounter: Payer: Self-pay | Admitting: Gastroenterology

## 2014-05-24 ENCOUNTER — Encounter: Payer: Self-pay | Admitting: Internal Medicine

## 2014-06-06 ENCOUNTER — Telehealth: Payer: Self-pay | Admitting: Gastroenterology

## 2014-06-06 NOTE — Telephone Encounter (Signed)
Patient is scheduled to see [name] on 06/14/14 3:00.  She is aware of the appt date and time

## 2014-06-14 ENCOUNTER — Ambulatory Visit (INDEPENDENT_AMBULATORY_CARE_PROVIDER_SITE_OTHER): Payer: Medicare Other | Admitting: Gastroenterology

## 2014-06-14 ENCOUNTER — Encounter: Payer: Self-pay | Admitting: Gastroenterology

## 2014-06-14 VITALS — BP 90/56 | HR 88 | Ht 61.75 in | Wt 99.1 lb

## 2014-06-14 DIAGNOSIS — R1084 Generalized abdominal pain: Secondary | ICD-10-CM | POA: Diagnosis not present

## 2014-06-14 DIAGNOSIS — K219 Gastro-esophageal reflux disease without esophagitis: Secondary | ICD-10-CM | POA: Diagnosis not present

## 2014-06-14 DIAGNOSIS — R634 Abnormal weight loss: Secondary | ICD-10-CM | POA: Diagnosis not present

## 2014-06-14 MED ORDER — RANITIDINE HCL 300 MG PO CAPS: 300.0000 mg | ORAL_CAPSULE | Freq: Two times a day (BID) | ORAL | Status: DC

## 2014-06-14 NOTE — Progress Notes (Signed)
History of Present Illness: This is a 69 year old female with a long history of GERD. She was treated with Protonix several years ago but after being diagnosed with osteoporosis she was changed to ranitidine by Dr. Adella Hare. In November she had a flare of reflux symptoms and her reflux symptoms have not been well controlled since then. She was evaluated by Dr. Alain Marion and her ranitidine was increased from 150 mg daily to 150 mg twice daily. Her reflux symptoms are under better control but are not well controlled. She states she has modified her diet and her food intake and she has noted of gradual 4 pound weight loss over the past 2-3 months. She has frequent lower and generalized abdominal discomfort that does not appear to be related to meals or bowel movement habits. She may has some mild constipation. She states that she has had a mildly elevated CA-125 level that has been closely monitored by her gynecologist. She has had normal pelvic ultrasounds.  Allergies  Allergen Reactions  . Avelox [Moxifloxacin Hcl In Nacl] Nausea Only and Other (See Comments)    GI UPSET  . Amoxicillin-Pot Clavulanate     REACTION: GI Upset  . Bactrim [Sulfamethoxazole-Trimethoprim]     flushing  . Benzonatate     REACTION: felt bad, out of sorts, disoriented.  . Ciprofloxacin     REACTION: rash  . Sulfonamide Derivatives    Outpatient Prescriptions Prior to Visit  Medication Sig Dispense Refill  . calcium carbonate (TUMS) 500 MG chewable tablet Chew 1 tablet (200 mg of elemental calcium total) by mouth daily. 100 tablet 3  . Cholecalciferol (VITAMIN D) 1000 UNITS capsule Take 1,000 Units by mouth daily.      . Estradiol 10 MCG TABS Place 1 tablet vaginally. Two times weekly.     . fluticasone (FLONASE) 50 MCG/ACT nasal spray Place 2 sprays into the nose as needed.     . loratadine (CLARITIN) 10 MG tablet Take 10 mg by mouth as needed for allergies.     . ranitidine (ZANTAC) 150 MG capsule Take 1  capsule (150 mg total) by mouth 2 (two) times daily. 60 capsule 11  . PARoxetine (PAXIL) 10 MG tablet Take 1 tablet (10 mg total) by mouth daily. (Patient not taking: Reported on 05/18/2014) 30 tablet 5   No facility-administered medications prior to visit.   Past Medical History  Diagnosis Date  . Allergic rhinitis   . GERD (gastroesophageal reflux disease)   . Menopausal disorder   . Insomnia   . Hemorrhoids   . Bronchiectasis   . Hearing loss   . TMJ syndrome   . Palpitations   . Urticaria   . Chronic sinusitis   . Osteoporosis   . Diverticulosis   . Adenomatous colon polyp 10/2003   Past Surgical History  Procedure Laterality Date  . Tympanostomy    . Nasal sinus surgery     History   Social History  . Marital Status: Married    Spouse Name: [name]    Number of Children: 0  . Years of Education: N/A   Occupational History  . retired Therapist, sports    Social History Main Topics  . Smoking status: Never Smoker   . Smokeless tobacco: Never Used  . Alcohol Use: 1.2 oz/week    2 Glasses of wine per week  . Drug Use: No  . Sexual Activity: None   Other Topics Concern  . None   Social History Narrative   Family  History  Problem Relation Age of Onset  . Rectal cancer Mother     died age 18  . Seizures Mother   . Colon cancer Mother 47  . Heart disease Father     dies age 45  . Cancer Maternal Grandfather   . Cancer Paternal Uncle     multiple uncles with cancer      Review of Systems: Pertinent positive and negative review of systems were noted in the above HPI section. All other review of systems were otherwise negative.   Physical Exam: General: Well developed, well nourished, no acute distress Head: Normocephalic and atraumatic Eyes:  sclerae anicteric, EOMI Ears: Normal auditory acuity Mouth: No deformity or lesions Neck: Supple, no masses or thyromegaly Lungs: Clear throughout to auscultation Heart: Regular rate and rhythm; no murmurs, rubs or  bruits Abdomen: Soft, non tender and non distended. No masses, hepatosplenomegaly or hernias noted. Normal Bowel sounds Musculoskeletal: Symmetrical with no gross deformities  Skin: No lesions on visible extremities Pulses:  Normal pulses noted Extremities: No clubbing, cyanosis, edema or deformities noted Neurological: Alert oriented x 4, grossly nonfocal Cervical Nodes:  No significant cervical adenopathy Inguinal Nodes: No significant inguinal adenopathy Psychological:  Alert and cooperative. Normal mood and affect  Assessment and Recommendations:  1. GERD. Inadequate symptom control. I recommended to increase ranitidine to 300 mg twice daily or change to a proton pump inhibitor daily, such as pantoprazole or omeprazole. She prefers to try the elevated dose of ranitidine for now. Schedule EGD to further evaluate her reflux symptoms, abdominal pain and weight loss. Weight loss is likely due to change in oral intake.  2. Personal history of adenomatous colon polyps and family history of colon cancer. Most recent colonoscopy was in September 2015 with 5 year interval surveillance recommended in September 2020.  3. Generalized abdominal pain. Possible IBS. See #1 as well. Consider CT scan of abdomen/pelvis if symptoms persist.  4. Mild constipation. Increase dietary fiber and water intake.   cc: [facility], [place], [place] [id number]

## 2014-06-14 NOTE — Patient Instructions (Signed)
Increase your ranitidine to 300 mg one tablet by mouth twice daily over the counter.   Patient advised to avoid spicy, acidic, citrus, chocolate, mints, fruit and fruit juices.  Limit the intake of caffeine, alcohol and Soda.  Don't exercise too soon after eating.  Don't lie down within 3-4 hours of eating.  Elevate the head of your bed.  You have been scheduled for an endoscopy. Please follow written instructions given to you at your visit today. If you use inhalers (even only as needed), please bring them with you on the day of your procedure. Your physician has requested that you go to www.startemmi.com and enter the access code given to you at your visit today. This web site gives a general overview about your procedure. However, you should still follow specific instructions given to you by our office regarding your preparation for the procedure.  Thank you for choosing me and [facility].  Pricilla Riffle. Dagoberto Ligas., MD., [facility]

## 2014-06-24 ENCOUNTER — Ambulatory Visit (AMBULATORY_SURGERY_CENTER): Payer: Medicare Other | Admitting: Gastroenterology

## 2014-06-24 ENCOUNTER — Encounter: Payer: Self-pay | Admitting: Gastroenterology

## 2014-06-24 VITALS — BP 110/67 | HR 65 | Temp 96.3°F | Resp 11 | Ht 61.75 in | Wt 99.0 lb

## 2014-06-24 DIAGNOSIS — K219 Gastro-esophageal reflux disease without esophagitis: Secondary | ICD-10-CM

## 2014-06-24 DIAGNOSIS — R1084 Generalized abdominal pain: Secondary | ICD-10-CM

## 2014-06-24 DIAGNOSIS — R634 Abnormal weight loss: Secondary | ICD-10-CM

## 2014-06-24 DIAGNOSIS — G4700 Insomnia, unspecified: Secondary | ICD-10-CM | POA: Diagnosis not present

## 2014-06-24 DIAGNOSIS — K589 Irritable bowel syndrome without diarrhea: Secondary | ICD-10-CM | POA: Diagnosis not present

## 2014-06-24 MED ORDER — SODIUM CHLORIDE 0.9 % IV SOLN: 500.0000 mL | INTRAVENOUS | Status: DC

## 2014-06-24 NOTE — Patient Instructions (Signed)

## 2014-06-24 NOTE — Progress Notes (Signed)
Stable to RR 

## 2014-06-24 NOTE — Progress Notes (Signed)
Called to room to assist during endoscopic procedure.  Patient ID and intended procedure confirmed with present staff. Received instructions for my participation in the procedure from the performing physician.  

## 2014-06-24 NOTE — Op Note (Addendum)
[place]  [address] [place], [id number]   ENDOSCOPY PROCEDURE REPORT  PATIENT: Monica, [name]  MR#: [id number] BIRTHDATE: August 17, 1945 , 61  yrs. old GENDER: female ENDOSCOPIST: Ladene Artist, MD, [facility] PROCEDURE DATE:  06/24/2014 PROCEDURE:  EGD w/ biopsy ASA CLASS:     Class II INDICATIONS:  history of esophageal reflux, generalized abd pain, weight loss MEDICATIONS: Monitored anesthesia care, Propofol 100 mg IV, and lidocaine 40 mg IV TOPICAL ANESTHETIC: none DESCRIPTION OF PROCEDURE: After the risks benefits and alternatives of the procedure were thoroughly explained, informed consent was obtained.  The LB [id number] endoscope was introduced through the mouth and advanced to the second portion of the duodenum , Without limitations.  The instrument was slowly withdrawn as the mucosa was fully examined.    ESOPHAGUS: The z-line was located 39cm from the incisors.  The z line appeared irregular and biopsies were taken. STOMACH: The mucosa of the stomach appeared normal. DUODENUM: The duodenal mucosa showed no abnormalities in the bulb and 2nd part of the duodenum.  Retroflexed views revealed no abnormalities.     The scope was then withdrawn from the patient and the procedure completed.  COMPLICATIONS: There were no immediate complications.  ENDOSCOPIC IMPRESSION: 1.   Variable z-line located 39cm from the incisors 2.   The EGD otherwise appeared normal  RECOMMENDATIONS: 1.  Anti-reflux regimen long term 2.  Await pathology 3.  Continue current meds. Change to PPI if GERD not adequately controlled within the next few weeks.   eSigned:  Ladene Artist, MD, [facility] 06/24/2014 9:01 AM Revised: 06/24/2014 9:01 AM

## 2014-06-27 ENCOUNTER — Telehealth: Payer: Self-pay | Admitting: *Deleted

## 2014-06-27 NOTE — Telephone Encounter (Signed)
  Follow up Call-  Call back number 06/24/2014 01/14/2014  Post procedure Call Back phone  # [phone]  Permission to leave phone message Yes Yes     Patient questions:  Do you have a fever, pain , or abdominal swelling? No. Pain Score  0 *  Have you tolerated food without any problems? Yes.    Have you been able to return to your normal activities? Yes.    Do you have any questions about your discharge instructions: Diet   No. Medications  No. Follow up visit  No.  Do you have questions or concerns about your Care? No.  Actions: * If pain score is 4 or above: No action needed, pain <4.

## 2014-06-28 ENCOUNTER — Encounter: Payer: Self-pay | Admitting: Gastroenterology

## 2014-06-30 DIAGNOSIS — H5213 Myopia, bilateral: Secondary | ICD-10-CM | POA: Diagnosis not present

## 2014-07-05 DIAGNOSIS — J019 Acute sinusitis, unspecified: Secondary | ICD-10-CM | POA: Diagnosis not present

## 2014-07-27 DIAGNOSIS — J310 Chronic rhinitis: Secondary | ICD-10-CM | POA: Diagnosis not present

## 2014-07-27 DIAGNOSIS — J320 Chronic maxillary sinusitis: Secondary | ICD-10-CM | POA: Diagnosis not present

## 2014-08-01 ENCOUNTER — Encounter: Payer: Self-pay | Admitting: Internal Medicine

## 2014-08-09 DIAGNOSIS — J309 Allergic rhinitis, unspecified: Secondary | ICD-10-CM | POA: Diagnosis not present

## 2014-08-09 DIAGNOSIS — H9042 Sensorineural hearing loss, unilateral, left ear, with unrestricted hearing on the contralateral side: Secondary | ICD-10-CM | POA: Diagnosis not present

## 2014-08-09 DIAGNOSIS — H6981 Other specified disorders of Eustachian tube, right ear: Secondary | ICD-10-CM | POA: Diagnosis not present

## 2014-08-09 DIAGNOSIS — H6063 Unspecified chronic otitis externa, bilateral: Secondary | ICD-10-CM | POA: Diagnosis not present

## 2014-09-26 ENCOUNTER — Ambulatory Visit (INDEPENDENT_AMBULATORY_CARE_PROVIDER_SITE_OTHER): Payer: Medicare Other | Admitting: Critical Care Medicine

## 2014-09-26 ENCOUNTER — Encounter: Payer: Self-pay | Admitting: Critical Care Medicine

## 2014-09-26 VITALS — BP 110/64 | HR 89 | Temp 98.0°F | Ht 62.0 in | Wt 102.6 lb

## 2014-09-26 DIAGNOSIS — J479 Bronchiectasis, uncomplicated: Secondary | ICD-10-CM | POA: Diagnosis not present

## 2014-09-26 DIAGNOSIS — Z23 Encounter for immunization: Secondary | ICD-10-CM

## 2014-09-26 NOTE — Progress Notes (Signed)
Subjective:    Patient ID: Monica [name], female    DOB: 01/24/1946, 69 y.o.   MRN: [id number]  HPI 09/26/2014 Chief Complaint  Patient presents with  . Follow-up    Last seen 09/02/13. Pt stated feeling well today. Denies cough, congestion, SOB, wheezing, chest pain, or PND    No new issues. Doing well.  No real cough. No winter issues as well.   Pt had EGD [place], pt with weight loss.  EGD was neg.  Mild reflux only seen Pt denies any significant sore throat, nasal congestion or excess secretions, fever, chills, sweats, unintended weight loss, pleurtic or exertional chest pain, orthopnea PND, or leg swelling Pt denies any increase in rescue therapy over baseline, denies waking up needing it or having any early am or nocturnal exacerbations of coughing/wheezing/or dyspnea. Pt also denies any obvious fluctuation in symptoms with  weather or environmental change or other alleviating or aggravating factors   Current Medications, Allergies, Complete Past Medical History, Past Surgical History, Family History, and Social History were reviewed in [employer] record.  Past Medical History  Diagnosis Date  . Allergic rhinitis   . GERD (gastroesophageal reflux disease)   . Menopausal disorder   . Insomnia   . Hemorrhoids   . Bronchiectasis   . Hearing loss   . TMJ syndrome   . Palpitations   . Urticaria   . Chronic sinusitis   . Osteoporosis   . Diverticulosis   . Adenomatous colon polyp 10/2003  . Allergy     SEASONAL     Family History  Problem Relation Age of Onset  . Rectal cancer Mother     died age 11  . Seizures Mother   . Colon cancer Mother 79  . Heart disease Father     dies age 68  . Cancer Maternal Grandfather   . Cancer Paternal Uncle     multiple uncles with cancer     History   Social History  . Marital Status: Married    Spouse Name: [name]  . Number of Children: 0  . Years of Education: N/A   Occupational History  . retired  Therapist, sports    Social History Main Topics  . Smoking status: Never Smoker   . Smokeless tobacco: Never Used  . Alcohol Use: 1.2 oz/week    2 Glasses of wine per week  . Drug Use: No  . Sexual Activity: Not on file   Other Topics Concern  . Not on file   Social History Narrative     Allergies  Allergen Reactions  . Avelox [Moxifloxacin Hcl In Nacl] Nausea Only and Other (See Comments)    GI UPSET  . Amoxicillin-Pot Clavulanate     REACTION: GI Upset  . Bactrim [Sulfamethoxazole-Trimethoprim]     flushing  . Benzonatate     REACTION: felt bad, out of sorts, disoriented.  . Ciprofloxacin     REACTION: rash  . Sulfonamide Derivatives      Outpatient Prescriptions Prior to Visit  Medication Sig Dispense Refill  . calcium carbonate (TUMS) 500 MG chewable tablet Chew 1 tablet (200 mg of elemental calcium total) by mouth daily. 100 tablet 3  . Cholecalciferol (VITAMIN D) 1000 UNITS capsule Take 1,000 Units by mouth daily.      . Estradiol 10 MCG TABS Place 1 tablet vaginally. Two times weekly.     . fluticasone (FLONASE) 50 MCG/ACT nasal spray Place 2 sprays into the nose as needed.     Marland Kitchen  Lactobacillus Rhamnosus, GG, (CULTURELLE PO) Take 1 tablet by mouth daily.    Marland Kitchen loratadine (CLARITIN) 10 MG tablet Take 10 mg by mouth as needed for allergies.     . Multiple Vitamins-Minerals (CENTRUM SILVER PO) Take 1 tablet by mouth twice a week    . ranitidine (ZANTAC) 300 MG capsule Take 1 capsule (300 mg total) by mouth 2 (two) times daily.    Marland Kitchen PARoxetine (PAXIL) 10 MG tablet Take 1 tablet (10 mg total) by mouth daily. (Patient not taking: Reported on 05/18/2014) 30 tablet 5   No facility-administered medications prior to visit.        Review of Systems  Constitutional: Negative for fever, chills, diaphoresis, appetite change, fatigue and unexpected weight change.  HENT: Negative for congestion, ear discharge, ear pain, hearing loss, nosebleeds, postnasal drip, rhinorrhea, sinus pressure,  sneezing, sore throat, trouble swallowing and voice change.   Eyes: Negative for discharge and itching.  Respiratory: Negative for cough, choking, chest tightness, shortness of breath, wheezing and stridor.   Cardiovascular: Negative for chest pain, palpitations and leg swelling.  Gastrointestinal: Negative.   Endocrine: Negative.   Genitourinary: Negative.   Musculoskeletal: Negative for arthralgias.  Skin: Negative for rash.  Allergic/Immunologic: Negative for environmental allergies.  Neurological: Negative for dizziness, syncope, weakness and headaches.  Hematological: Does not bruise/bleed easily.  Psychiatric/Behavioral: Negative for sleep disturbance and agitation. The patient is not nervous/anxious.        Objective:   Physical Exam  Constitutional: She is oriented to person, place, and time. She appears well-developed and well-nourished. She is active.  HENT:  Head: Normocephalic and atraumatic.  Nose: No mucosal edema, rhinorrhea, sinus tenderness, nasal deformity or septal deviation. No epistaxis. Right sinus exhibits no maxillary sinus tenderness and no frontal sinus tenderness. Left sinus exhibits no maxillary sinus tenderness and no frontal sinus tenderness.  Mouth/Throat: Oropharynx is clear and moist. No oropharyngeal exudate.  Eyes: Conjunctivae and EOM are normal. Pupils are equal, round, and reactive to light. No scleral icterus.  Neck: Trachea normal and normal range of motion. Neck supple. No JVD present. No tracheal tenderness and no muscular tenderness present. Carotid bruit is not present. No rigidity. No tracheal deviation, no edema, no erythema and normal range of motion present. No thyromegaly present.  Cardiovascular: Normal rate, regular rhythm, S1 normal, S2 normal, normal heart sounds, intact distal pulses and normal pulses.  PMI is not displaced.  Exam reveals no gallop, no S3, no S4, no distant heart sounds and no friction rub.   No murmur heard.  No  systolic murmur is present   No diastolic murmur is present  Pulmonary/Chest: No accessory muscle usage or stridor. No apnea and no tachypnea. No respiratory distress. She has no decreased breath sounds. She has no wheezes. She has no rhonchi. She has no rales. Chest wall is not dull to percussion. She exhibits no mass, no tenderness, no bony tenderness and no deformity.  Abdominal: Soft. Normal appearance and bowel sounds are normal. She exhibits no distension and no ascites. There is no hepatosplenomegaly. There is no tenderness. There is no rigidity, no rebound and no guarding.  Musculoskeletal: Normal range of motion.  Lymphadenopathy:       Head (right side): No submental and no submandibular adenopathy present.       Head (left side): No submental and no submandibular adenopathy present.    She has no cervical adenopathy.  Neurological: She is alert and oriented to person, place, and time. She has  normal strength. No sensory deficit.  Skin: Skin is warm and dry. No rash noted. She is not diaphoretic. No pallor. Nails show no clubbing.  Psychiatric: She has a normal mood and affect. Her speech is normal and behavior is normal.  Vitals reviewed.         Assessment & Plan:  I personally reviewed all images and lab data in the [facility] system as well as any outside material available during this office visit and agree with the  radiology impressions.  I also have reviewed any data /notes/records if available in care everywhere.  Bronchiectasis without acute exacerbation Right middle lobe bronchiectasis stable this time with previous history of acute on chronic sinusitis exacerbating airway disease now stable Plan Maintain off inhaled medications for now Return 1 year or sooner if necessary   administer Pneumovax which was the last pneumonia vaccine this patient will require  Vena was seen today for follow-up.  Diagnoses and all orders for this visit:  Bronchiectasis without  complication  Other orders -     Pneumococcal polysaccharide vaccine 23-valent greater than or equal to 2yo subcutaneous/IM    I h

## 2014-09-26 NOTE — Assessment & Plan Note (Signed)
Right middle lobe bronchiectasis stable this time with previous history of acute on chronic sinusitis exacerbating airway disease now stable Plan Maintain off inhaled medications for now Return 1 year or sooner if necessary

## 2014-09-26 NOTE — Patient Instructions (Signed)
Pneumovax was given Return 1 year

## 2014-09-28 ENCOUNTER — Encounter (HOSPITAL_COMMUNITY): Payer: Self-pay

## 2014-09-28 ENCOUNTER — Emergency Department (HOSPITAL_COMMUNITY)
Admission: EM | Admit: 2014-09-28 | Discharge: 2014-09-28 | Disposition: A | Discharge status: Home / Self Care | Payer: Medicare Other | Attending: Emergency Medicine | Admitting: Emergency Medicine

## 2014-09-28 DIAGNOSIS — H9190 Unspecified hearing loss, unspecified ear: Secondary | ICD-10-CM | POA: Insufficient documentation

## 2014-09-28 DIAGNOSIS — Z872 Personal history of diseases of the skin and subcutaneous tissue: Secondary | ICD-10-CM | POA: Diagnosis not present

## 2014-09-28 DIAGNOSIS — R112 Nausea with vomiting, unspecified: Secondary | ICD-10-CM | POA: Insufficient documentation

## 2014-09-28 DIAGNOSIS — M810 Age-related osteoporosis without current pathological fracture: Secondary | ICD-10-CM | POA: Diagnosis not present

## 2014-09-28 DIAGNOSIS — Z7951 Long term (current) use of inhaled steroids: Secondary | ICD-10-CM | POA: Diagnosis not present

## 2014-09-28 DIAGNOSIS — Z86018 Personal history of other benign neoplasm: Secondary | ICD-10-CM | POA: Diagnosis not present

## 2014-09-28 DIAGNOSIS — Z8669 Personal history of other diseases of the nervous system and sense organs: Secondary | ICD-10-CM | POA: Diagnosis not present

## 2014-09-28 DIAGNOSIS — Z880 Allergy status to penicillin: Secondary | ICD-10-CM | POA: Insufficient documentation

## 2014-09-28 DIAGNOSIS — K219 Gastro-esophageal reflux disease without esophagitis: Secondary | ICD-10-CM | POA: Diagnosis not present

## 2014-09-28 DIAGNOSIS — R197 Diarrhea, unspecified: Secondary | ICD-10-CM | POA: Insufficient documentation

## 2014-09-28 DIAGNOSIS — Z8709 Personal history of other diseases of the respiratory system: Secondary | ICD-10-CM | POA: Diagnosis not present

## 2014-09-28 DIAGNOSIS — Z79899 Other long term (current) drug therapy: Secondary | ICD-10-CM | POA: Diagnosis not present

## 2014-09-28 DIAGNOSIS — Z8742 Personal history of other diseases of the female genital tract: Secondary | ICD-10-CM | POA: Diagnosis not present

## 2014-09-28 LAB — COMPREHENSIVE METABOLIC PANEL
ALBUMIN: 4.1 g/dL (ref 3.5–5.0)
ALT: 17 U/L (ref 14–54)
ANION GAP: 10 (ref 5–15)
AST: 28 U/L (ref 15–41)
Alkaline Phosphatase: 64 U/L (ref 38–126)
BUN: 24 mg/dL — AB (ref 6–20)
CALCIUM: 8.9 mg/dL (ref 8.9–10.3)
CO2: 25 mmol/L (ref 22–32)
Chloride: 101 mmol/L (ref 101–111)
Creatinine, Ser: 0.82 mg/dL (ref 0.44–1.00)
GFR calc non Af Amer: >60 mL/min (ref >60)
Glucose, Bld: 182 mg/dL — ABNORMAL HIGH (ref 65–99)
Potassium: 4.3 mmol/L (ref 3.5–5.1)
Sodium: 136 mmol/L (ref 135–145)
Total Bilirubin: 1.1 mg/dL (ref 0.3–1.2)
Total Protein: 6.9 g/dL (ref 6.5–8.1)

## 2014-09-28 LAB — CBC WITH DIFFERENTIAL/PLATELET
BASOS ABS: 0 10*3/uL (ref 0.0–0.1)
Basophils Relative: 0 % (ref 0–1)
Eosinophils Absolute: 0 10*3/uL (ref 0.0–0.7)
Eosinophils Relative: 0 % (ref 0–5)
HCT: 42.7 % (ref 36.0–46.0)
HEMOGLOBIN: 14.3 g/dL (ref 12.0–15.0)
Lymphocytes Relative: 1 % — ABNORMAL LOW (ref 12–46)
Lymphs Abs: 0.1 10*3/uL — ABNORMAL LOW (ref 0.7–4.0)
MCH: 30.8 pg (ref 26.0–34.0)
MCHC: 33.5 g/dL (ref 30.0–36.0)
MCV: 92 fL (ref 78.0–100.0)
Monocytes Absolute: 0.3 10*3/uL (ref 0.1–1.0)
Monocytes Relative: 3 % (ref 3–12)
NEUTROS ABS: 9.4 10*3/uL — AB (ref 1.7–7.7)
Neutrophils Relative %: 96 % — ABNORMAL HIGH (ref 43–77)
Platelets: 261 10*3/uL (ref 150–400)
RBC: 4.64 MIL/uL (ref 3.87–5.11)
RDW: 12.5 % (ref 11.5–15.5)
WBC: 9.9 10*3/uL (ref 4.0–10.5)

## 2014-09-28 LAB — LIPASE, BLOOD: Lipase: 13 U/L — ABNORMAL LOW (ref 22–51)

## 2014-09-28 MED ORDER — KETOROLAC TROMETHAMINE 30 MG/ML IJ SOLN
30.0000 mg | Freq: Once | INTRAMUSCULAR | Status: DC
  Filled 2014-09-28: qty 1

## 2014-09-28 MED ORDER — SODIUM CHLORIDE 0.9 % IV BOLUS (SEPSIS)
1500.0000 mL | Freq: Once | INTRAVENOUS | Status: AC
  Administered 2014-09-28: 1500 mL via INTRAVENOUS

## 2014-09-28 MED ORDER — ONDANSETRON 8 MG PO TBDP: 8.0000 mg | ORAL_TABLET | Freq: Three times a day (TID) | ORAL | Status: DC | PRN

## 2014-09-28 MED ORDER — ONDANSETRON HCL 4 MG/2ML IJ SOLN
4.0000 mg | Freq: Once | INTRAMUSCULAR | Status: AC
  Administered 2014-09-28: 4 mg via INTRAVENOUS
  Filled 2014-09-28: qty 2

## 2014-09-28 MED ORDER — DICYCLOMINE HCL 10 MG PO CAPS
10.0000 mg | ORAL_CAPSULE | Freq: Once | ORAL | Status: AC
  Administered 2014-09-28: 10 mg via ORAL
  Filled 2014-09-28: qty 1

## 2014-09-28 NOTE — ED Notes (Signed)
Pt states n/v/d since last night.  Feels dehydrated.  Needs "a liter of fluid". Pt states slight mid abdominal pain.  Pain/symptoms occurred after eating but spouse does not have same.

## 2014-09-28 NOTE — ED Provider Notes (Signed)
CSN: [id number]     Arrival date & time 09/28/14  7867 History   First MD Initiated Contact with Patient 09/28/14 [phone]     Chief Complaint  Patient presents with  . Emesis  . Diarrhea      HPI Patient reports developing nausea vomiting and diarrhea since last night.  No hematemesis.  Denies melena or hematochezia.  Patient reports some mid abdominal cramping.  No fevers or chills.  No recent sick contacts.  Symptoms are mild to moderate in severity.  She's concerned about the possibility of developing dehydration.   Past Medical History  Diagnosis Date  . Allergic rhinitis   . GERD (gastroesophageal reflux disease)   . Menopausal disorder   . Insomnia   . Hemorrhoids   . Bronchiectasis   . Hearing loss   . TMJ syndrome   . Palpitations   . Urticaria   . Chronic sinusitis   . Osteoporosis   . Diverticulosis   . Adenomatous colon polyp 10/2003  . Allergy     SEASONAL   Past Surgical History  Procedure Laterality Date  . Tympanostomy    . Nasal sinus surgery    . Colonoscopy     Family History  Problem Relation Age of Onset  . Rectal cancer Mother     died age 21  . Seizures Mother   . Colon cancer Mother 32  . Heart disease Father     dies age 75  . Cancer Maternal Grandfather   . Cancer Paternal Uncle     multiple uncles with cancer   History  Substance Use Topics  . Smoking status: Never Smoker   . Smokeless tobacco: Never Used  . Alcohol Use: 1.2 oz/week    2 Glasses of wine per week   OB History    No data available     Review of Systems  All other systems reviewed and are negative.     Allergies  Avelox; Amoxicillin-pot clavulanate; Bactrim; Benzonatate; Ciprofloxacin; and Sulfonamide derivatives  Home Medications   Prior to Admission medications   Medication Sig Start Date End Date Taking? Authorizing Provider  acetaminophen (TYLENOL) 500 MG tablet Take 500 mg by mouth every 6 (six) hours as needed for moderate pain or headache.   Yes  Historical Provider, MD  calcium carbonate (TUMS) 500 MG chewable tablet Chew 1 tablet (200 mg of elemental calcium total) by mouth daily. 04/13/14  Yes Aleksei Plotnikov V, MD  cetirizine (ZYRTEC) 10 MG tablet Take 5 mg by mouth daily as needed for allergies.   Yes Historical Provider, MD  Cholecalciferol (VITAMIN D) 1000 UNITS capsule Take 1,000 Units by mouth daily.     Yes Historical Provider, MD  Estradiol 10 MCG TABS Place 1 tablet vaginally 2 (two) times a week.    Yes Historical Provider, MD  fluticasone (FLONASE) 50 MCG/ACT nasal spray Place 2 sprays into the nose as needed for allergies or rhinitis.  03/05/11  Yes Elsie Stain, MD  Lactobacillus Rhamnosus, GG, (CULTURELLE PO) Take 1 tablet by mouth daily.   Yes Historical Provider, MD  loratadine (CLARITIN) 10 MG tablet Take 10 mg by mouth as needed for allergies.    Yes Historical Provider, MD  Multiple Vitamins-Minerals (CENTRUM SILVER PO) Take 1 tablet by mouth twice a week   Yes Historical Provider, MD  omeprazole (PRILOSEC) 20 MG capsule Take 20 mg by mouth daily as needed (acid reflux).    Yes Historical Provider, MD  PARoxetine (PAXIL) 10 MG  tablet Take 1 tablet (10 mg total) by mouth daily. Patient not taking: Reported on 05/18/2014 04/13/14   Aleksei Plotnikov V, MD  ranitidine (ZANTAC) 300 MG capsule Take 1 capsule (300 mg total) by mouth 2 (two) times daily. Patient not taking: Reported on 09/28/2014 06/14/14   Ladene Artist, MD   BP 112/54 mmHg  Pulse 118  Temp(Src) 98.1 F (36.7 C) (Oral)  Resp 18  SpO2 97% Physical Exam  Constitutional: She is oriented to person, place, and time. She appears well-developed and well-nourished. No distress.  HENT:  Head: Normocephalic and atraumatic.  Eyes: EOM are normal.  Neck: Normal range of motion.  Cardiovascular: Normal rate, regular rhythm and normal heart sounds.   Pulmonary/Chest: Effort normal and breath sounds normal.  Abdominal: Soft. She exhibits no distension. There  is no tenderness.  Musculoskeletal: Normal range of motion.  Neurological: She is alert and oriented to person, place, and time.  Skin: Skin is warm and dry.  Psychiatric: She has a normal mood and affect. Judgment normal.  Nursing note and vitals reviewed.   ED Course  Procedures (including critical care time) Labs Review Labs Reviewed  CBC WITH DIFFERENTIAL/PLATELET - Abnormal; Notable for the following:    Neutrophils Relative % 96 (*)    Neutro Abs 9.4 (*)    Lymphocytes Relative 1 (*)    Lymphs Abs 0.1 (*)    All other components within normal limits  COMPREHENSIVE METABOLIC PANEL - Abnormal; Notable for the following:    Glucose, Bld 182 (*)    BUN 24 (*)    All other components within normal limits  LIPASE, BLOOD - Abnormal; Notable for the following:    Lipase 13 (*)    All other components within normal limits    Imaging Review No results found.   EKG Interpretation None      MDM   Final diagnoses:  None    10:40 AM Patient reports she feels much better this time.  Discharge home in good condition.  Nausea control.  Home with anti nausea medicine    Jola Schmidt, MD 09/28/14 1041

## 2014-10-18 IMAGING — CT CT HEAD W/O CM
1 series · 16 of 30 positions shown, 20 images · non-contrast
Comparison: 07/20/2004

CLINICAL DATA: Refractory headache with nausea

CT HEAD WITHOUT CONTRAST
TECHNIQUE: Contiguous axial images were obtained from the base of
the skull through the vertex without contrast.

[Series 2: head_seq -c 4.5 h37s st · axial · 0.43mm/px · z∈[-74,+52]mm · 16 of 32 slices shown, 20 images]
[im 2/32  brain]
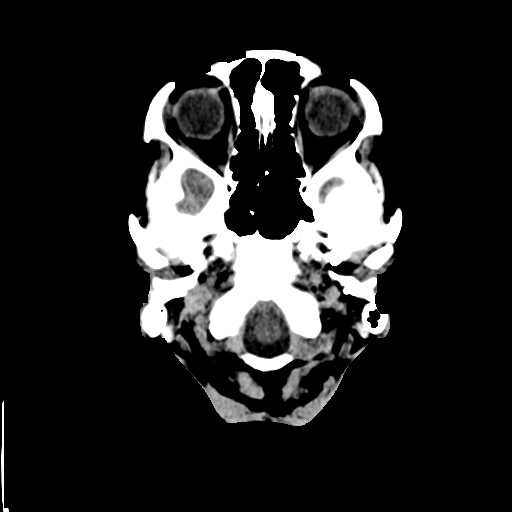
[im 2/32  bone]
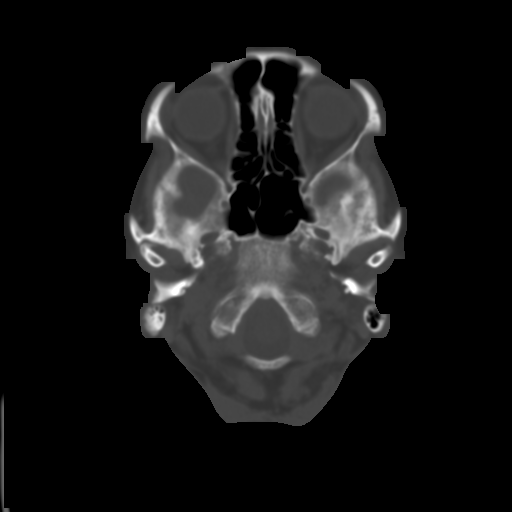
[im 4/32  brain]
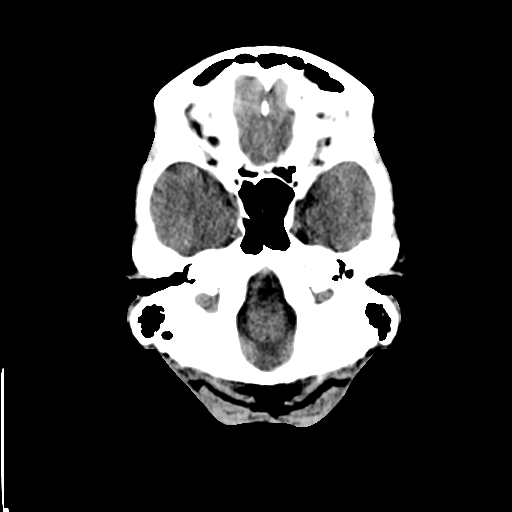
[im 6/32  brain]
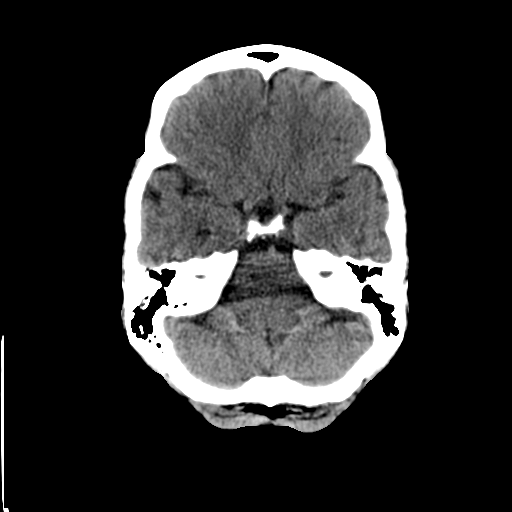
[im 8/32  brain]
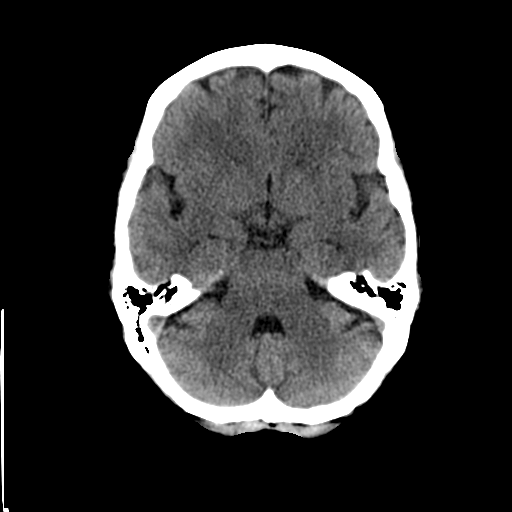
[im 9/32  brain]
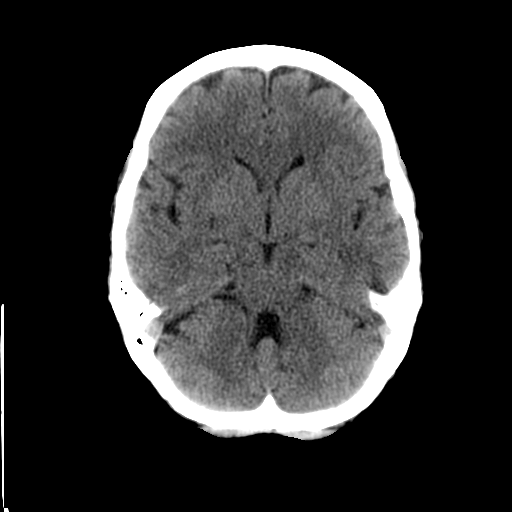
[im 9/32  bone]
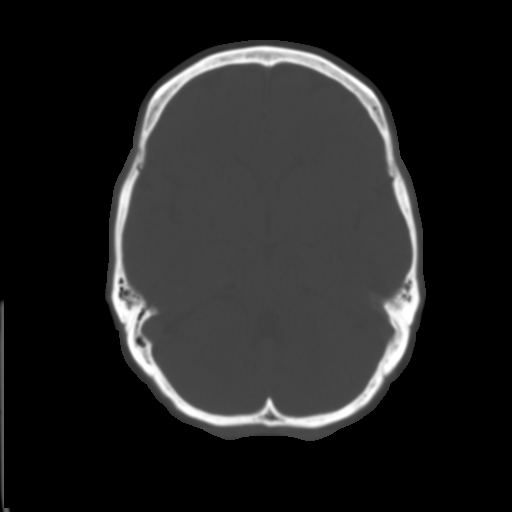
[im 11/32  brain]
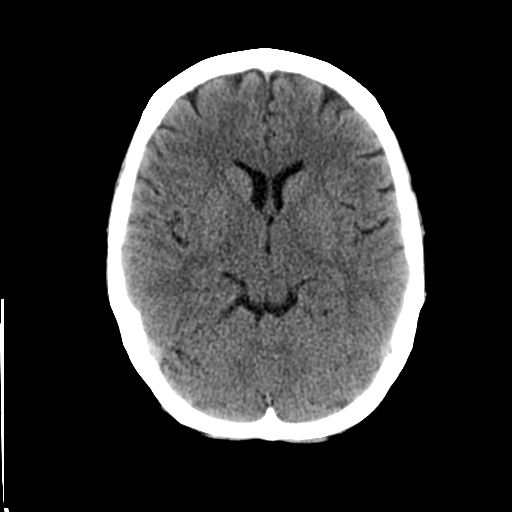
[im 13/32  brain]
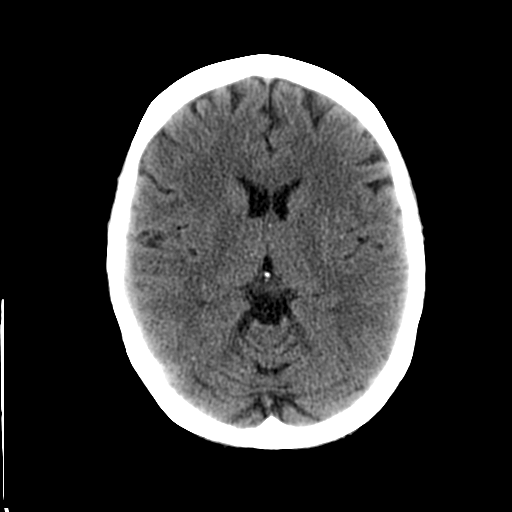
[im 15/32  brain]
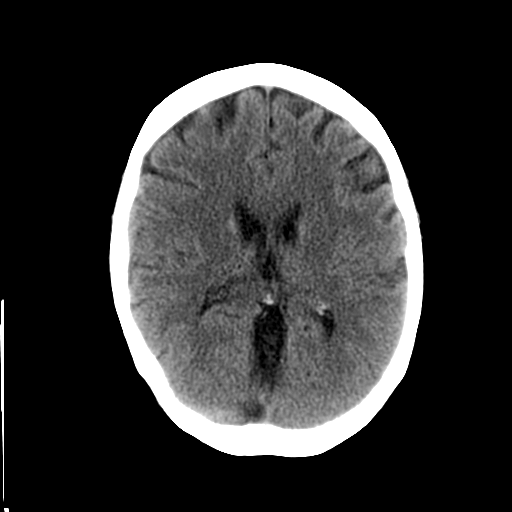
[im 17/32  brain]
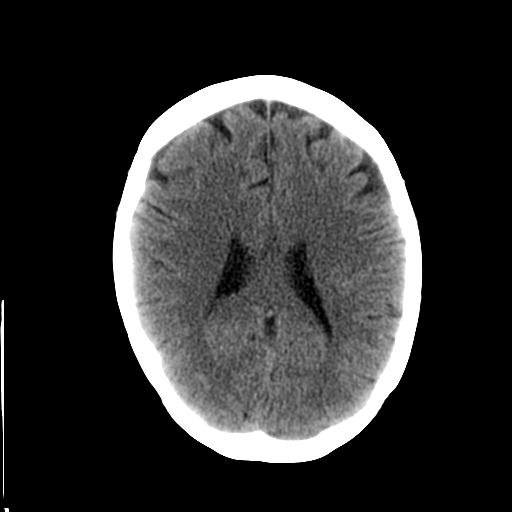
[im 17/32  bone]
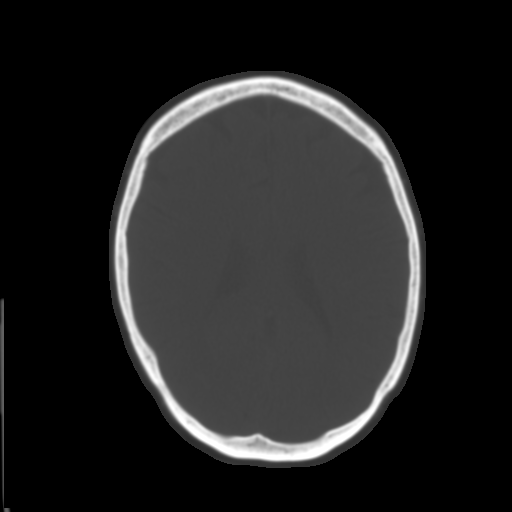
[im 19/32  brain]
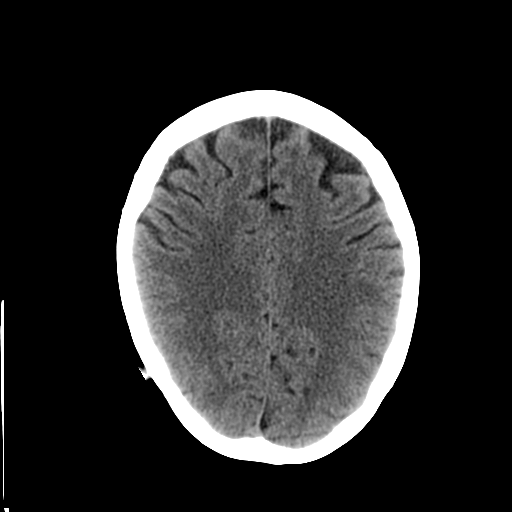
[im 21/32  brain]
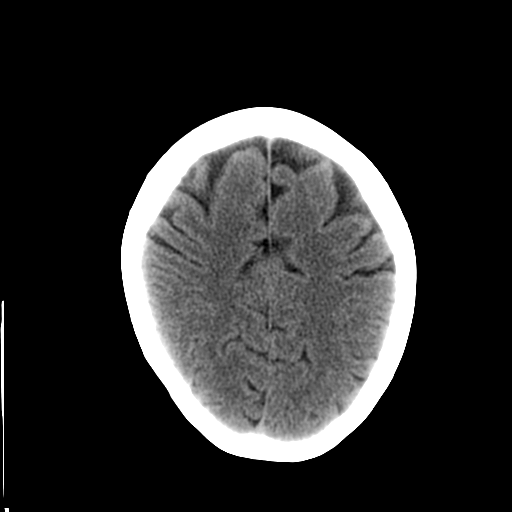
[im 23/32  brain]
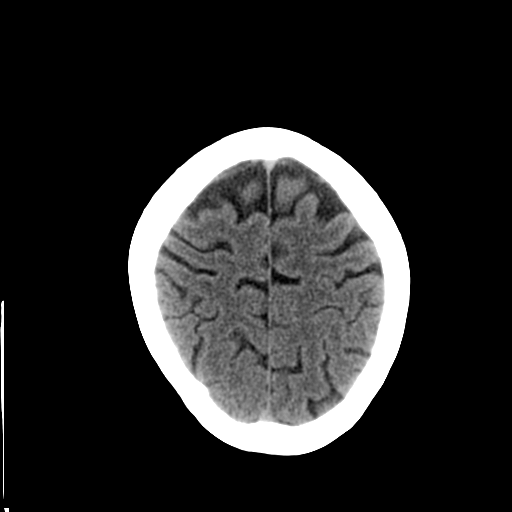
[im 24/32  brain]
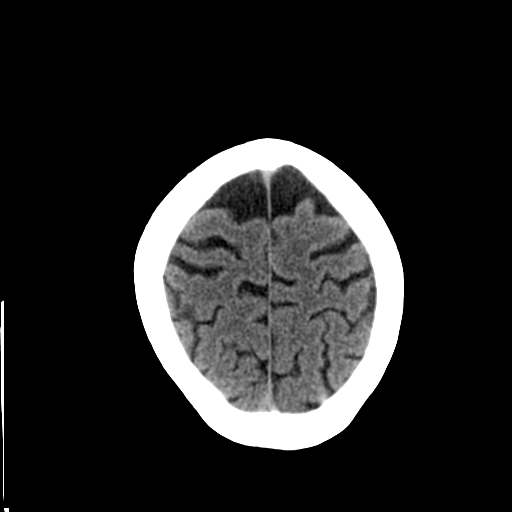
[im 24/32  bone]
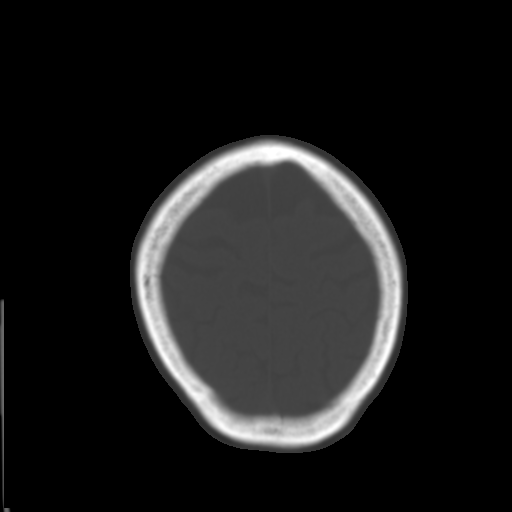
[im 26/32  brain]
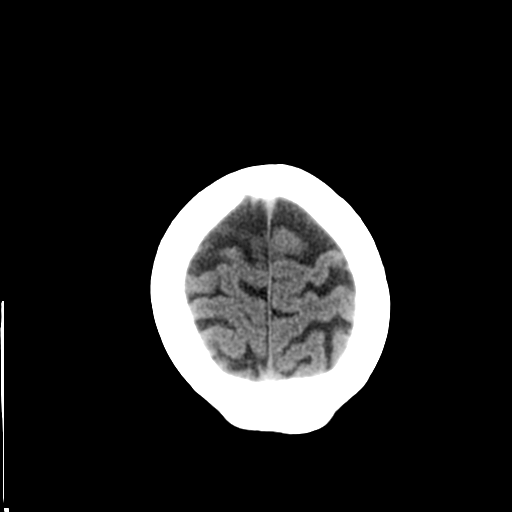
[im 28/32  brain]
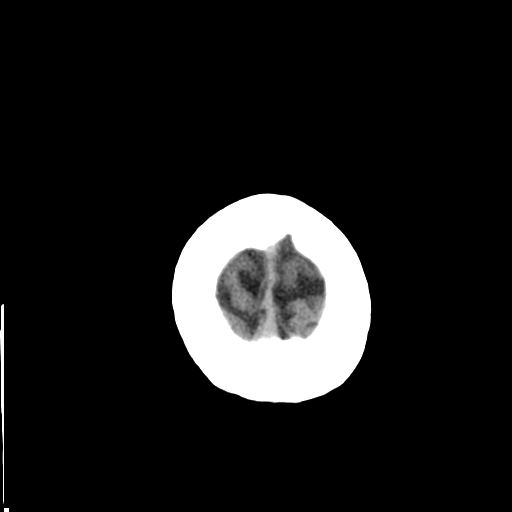
[im 30/32  brain]
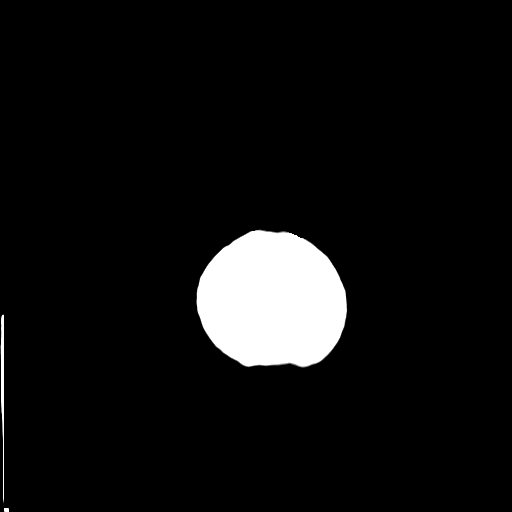

[16 of 30 positions shown; findings below may reference images not displayed]

FINDINGS: The brain has a normal appearance without evidence of
malformation, old or acute infarction, mass lesion, hemorrhage,
hydrocephalus or extra-axial collection.  No fluid in the
visualized sinuses, middle ears or mastoids.  No calvarial
abnormality.  Incidental sebaceous cyst noted in the right parietal
scalp.
IMPRESSION: Normal appearance of the brain.  No finding to explain headaches.

## 2014-10-28 DIAGNOSIS — E559 Vitamin D deficiency, unspecified: Secondary | ICD-10-CM | POA: Diagnosis not present

## 2014-10-28 DIAGNOSIS — Z1389 Encounter for screening for other disorder: Secondary | ICD-10-CM | POA: Diagnosis not present

## 2014-10-28 DIAGNOSIS — N952 Postmenopausal atrophic vaginitis: Secondary | ICD-10-CM | POA: Diagnosis not present

## 2014-10-28 DIAGNOSIS — Z124 Encounter for screening for malignant neoplasm of cervix: Secondary | ICD-10-CM | POA: Diagnosis not present

## 2014-11-03 ENCOUNTER — Ambulatory Visit (INDEPENDENT_AMBULATORY_CARE_PROVIDER_SITE_OTHER): Payer: Medicare Other | Admitting: Family Medicine

## 2014-11-03 VITALS — BP 118/72 | HR 88 | Temp 98.2°F | Resp 16 | Ht 61.5 in | Wt 100.4 lb

## 2014-11-03 DIAGNOSIS — M79672 Pain in left foot: Secondary | ICD-10-CM | POA: Diagnosis not present

## 2014-11-03 MED ORDER — CEPHALEXIN 500 MG PO CAPS: 500.0000 mg | ORAL_CAPSULE | Freq: Two times a day (BID) | ORAL | Status: DC

## 2014-11-03 NOTE — Progress Notes (Signed)
Subjective:  This chart was scribed for Merri Ray, MD by [facility], medical scribe at [facility].The patient was seen in exam room 09 and the patient's care was started at 8:18 PM.   Patient ID: Monica [name], female    DOB: 1946/01/28, 69 y.o.   MRN: [id number] Chief Complaint  Patient presents with  . Insect Bite    insect bite to bottom of left foot.  aching now. happened today about 3 pm   HPI  HPI Comments: Monica [name] is a 69 y.o. female who presents to [facility] complaining of a bruise on the distal aspect of her left foot, acute onset today around 3:00 PM. She did notice some spiders in the area but she does not remember being bitten. She stepped close to her doorway while cleaning a bare floor and felt a pain. She was bare foot at the time. Some "aching" initially that has gradually worsened. She has iced the area and cortisone cream. Pt is able to bear weight but this is uncomfortable. No itching. Pt is UTD on her tetanus, last administered 2 years ago.  Patient Active Problem List   Diagnosis Date Noted  . Chest pain, atypical 04/13/2014  . Healthcare maintenance 02/16/2013  . Elevated CA-125 02/16/2013  . IBS (irritable bowel syndrome) 11/25/2010  . ANXIETY DISORDER, SITUATIONAL, MILD 10/20/2009  . ALLERGIC RHINITIS, SEASONAL 04/23/2007  . GERD 03/10/2007  . Insomnia due to stress 01/26/2007  . HEARING LOSS, LEFT EAR 01/26/2007  . SINUSITIS, CHRONIC 01/26/2007  . Bronchiectasis without acute exacerbation 01/26/2007  . TMJ SYNDROME 01/26/2007  . MENOPAUSAL DISORDER 01/26/2007  . OSTEOPOROSIS 01/26/2007  . PALPITATIONS 01/26/2007   Past Medical History  Diagnosis Date  . Allergic rhinitis   . GERD (gastroesophageal reflux disease)   . Menopausal disorder   . Insomnia   . Hemorrhoids   . Bronchiectasis   . Hearing loss   . TMJ syndrome   . Palpitations   . Urticaria   . Chronic sinusitis   . Osteoporosis     . Diverticulosis   . Adenomatous colon polyp 10/2003  . Allergy     SEASONAL   Past Surgical History  Procedure Laterality Date  . Tympanostomy    . Nasal sinus surgery    . Colonoscopy     Allergies  Allergen Reactions  . Avelox [Moxifloxacin Hcl In Nacl] Nausea Only and Other (See Comments)    GI UPSET  . Amoxicillin-Pot Clavulanate     REACTION: GI Upset  . Bactrim [Sulfamethoxazole-Trimethoprim]     flushing  . Benzonatate     REACTION: felt bad, out of sorts, disoriented.  . Ciprofloxacin     REACTION: rash  . Sulfonamide Derivatives     Felt flushed and a little palpitations.    Prior to Admission medications   Medication Sig Start Date End Date Taking? Authorizing Provider  acetaminophen (TYLENOL) 500 MG tablet Take 500 mg by mouth every 6 (six) hours as needed for moderate pain or headache.   Yes Historical Provider, MD  calcium carbonate (TUMS) 500 MG chewable tablet Chew 1 tablet (200 mg of elemental calcium total) by mouth daily. 04/13/14  Yes Aleksei Plotnikov V, MD  cetirizine (ZYRTEC) 10 MG tablet Take 5 mg by mouth daily as needed for allergies.   Yes Historical Provider, MD  Estradiol 10 MCG TABS Place 1 tablet vaginally 2 (two) times a week.    Yes Historical  Provider, MD  fluticasone (FLONASE) 50 MCG/ACT nasal spray Place 2 sprays into the nose as needed for allergies or rhinitis.  03/05/11  Yes Elsie Stain, MD  Lactobacillus Rhamnosus, GG, (CULTURELLE PO) Take 1 tablet by mouth daily.   Yes Historical Provider, MD  loratadine (CLARITIN) 10 MG tablet Take 10 mg by mouth as needed for allergies.    Yes Historical Provider, MD  Multiple Vitamins-Minerals (CENTRUM SILVER PO) Take 1 tablet by mouth twice a week   Yes Historical Provider, MD  omeprazole (PRILOSEC) 20 MG capsule Take 20 mg by mouth daily as needed (acid reflux).    Yes Historical Provider, MD  ondansetron (ZOFRAN ODT) 8 MG disintegrating tablet Take 1 tablet (8 mg total) by mouth every 8 (eight)  hours as needed for nausea or vomiting. 09/28/14  Yes Jola Schmidt, MD  PARoxetine (PAXIL) 10 MG tablet Take 1 tablet (10 mg total) by mouth daily. 04/13/14  Yes Aleksei Plotnikov V, MD  ranitidine (ZANTAC) 300 MG capsule Take 1 capsule (300 mg total) by mouth 2 (two) times daily. 06/14/14  Yes Ladene Artist, MD  Cholecalciferol (VITAMIN D) 1000 UNITS capsule Take 1,000 Units by mouth daily.      Historical Provider, MD   History   Social History  . Marital Status: Married    Spouse Name: [name]  . Number of Children: 0  . Years of Education: N/A   Occupational History  . retired Therapist, sports    Social History Main Topics  . Smoking status: Never Smoker   . Smokeless tobacco: Never Used  . Alcohol Use: 1.2 oz/week    2 Glasses of wine per week  . Drug Use: No  . Sexual Activity: Not on file   Other Topics Concern  . Not on file   Social History Narrative   Review of Systems  Skin: Positive for color change and wound.       Objective:  BP 118/72 mmHg  Pulse 88  Temp(Src) 98.2 F (36.8 C) (Oral)  Resp 16  Ht 5' 1.5" (1.562 m)  Wt 100 lb 6 oz (45.53 kg)  BMI 18.66 kg/m2  SpO2 98% Physical Exam  Constitutional: She is oriented to person, place, and time. She appears well-developed and well-nourished. No distress.  HENT:  Head: Normocephalic and atraumatic.  Eyes: Pupils are equal, round, and reactive to light.  Neck: Normal range of motion.  Cardiovascular: Normal rate and regular rhythm.   Pulmonary/Chest: Effort normal. No respiratory distress.  Musculoskeletal: Normal range of motion.  There is a 2 x 1.5 cm faint ecchymosis with slight soft tissue swelling at the base of the 5th metatarsal head. There is a possible small single puncture at the distal lateral aspect of this area. No appreciable warmth noted.   Neurological: She is alert and oriented to person, place, and time.  Skin: Skin is warm and dry.  Psychiatric: She has a normal mood and affect. Her behavior is normal.    Nursing note and vitals reviewed.     Assessment & Plan:   Monica [name] is a 69 y.o. female Left foot pain - Plan: cephALEXin (KEFLEX) 500 MG capsule  Possible small puncture versus bite/sting. Appears to be slight bruising but not infection at this time. Prescription for Keflex if any increased redness or warmth tomorrow but symptomatic care discussed per AVS instructions. Return to clinic precautions discussed.  Meds ordered this encounter  Medications  . cephALEXin (KEFLEX) 500 MG capsule  Sig: Take 1 capsule (500 mg total) by mouth 2 (two) times daily.    Dispense:  20 capsule    Refill:  0   Patient Instructions  You may have had either an insect bite or small puncture under your foot. cleanse area with soap and water twice per day, keep clean and covered. If any redness or warmth tomorrow - can start antibiotic.  Return to the clinic or go to the nearest emergency room if any of your symptoms worsen or new symptoms occur.  Puncture Wound A puncture wound is an injury that extends through all layers of the skin and into the tissue beneath the skin (subcutaneous tissue). Puncture wounds become infected easily because germs often enter the body and go beneath the skin during the injury. Having a deep wound with a small entrance point makes it difficult for your caregiver to adequately clean the wound. This is especially true if you have stepped on a nail and it has passed through a dirty shoe or other situations where the wound is obviously contaminated. CAUSES  Many puncture wounds involve glass, nails, splinters, fish hooks, or other objects that enter the skin (foreign bodies). A puncture wound may also be caused by a human bite or animal bite. DIAGNOSIS  A puncture wound is usually diagnosed by your history and a physical exam. You may need to have an X-ray or an ultrasound to check for any foreign bodies still in the wound. TREATMENT   Your caregiver will clean the wound as  thoroughly as possible. Depending on the location of the wound, a bandage (dressing) may be applied.  Your caregiver might prescribe antibiotic medicines.  You may need a follow-up visit to check on your wound. Follow all instructions as directed by your caregiver. HOME CARE INSTRUCTIONS   Change your dressing once per day, or as directed by your caregiver. If the dressing sticks, it may be removed by soaking the area in water.  If your caregiver has given you follow-up instructions, it is very important that you return for a follow-up appointment. Not following up as directed could result in a chronic or permanent injury, pain, and disability.  Only take over-the-counter or prescription medicines for pain, discomfort, or fever as directed by your caregiver.  If you are given antibiotics, take them as directed. Finish them even if you start to feel better. You may need a tetanus shot if:  You cannot remember when you had your last tetanus shot.  You have never had a tetanus shot. If you got a tetanus shot, your arm may swell, get red, and feel warm to the touch. This is common and not a problem. If you need a tetanus shot and you choose not to have one, there is a rare chance of getting tetanus. Sickness from tetanus can be serious. You may need a rabies shot if an animal bite caused your puncture wound. SEEK MEDICAL CARE IF:   You have redness, swelling, or increasing pain in the wound.  You have red streaks going away from the wound.  You notice a bad smell coming from the wound or dressing.  You have yellowish-white fluid (pus) coming from the wound.  You are treated with an antibiotic for infection, but the infection is not getting better.  You notice something in the wound, such as rubber from your shoe, cloth, or another object.  You have a fever.  You have severe pain.  You have difficulty breathing.  You feel  dizzy or faint.  You cannot stop vomiting.  You lose  feeling, develop numbness, or cannot move a limb below the wound.  Your symptoms worsen. MAKE SURE YOU:  Understand these instructions.  Will watch your condition.  Will get help right away if you are not doing well or get worse. Document Released: 01/30/2005 Document Revised: 07/15/2011 Document Reviewed: 10/09/2010 [facility] Patient Information 2015 [place], [place]. This information is not intended to replace advice given to you by your health care provider. Make sure you discuss any questions you have with your health care provider.      I personally performed the services described in this documentation, which was scribed in my presence. The recorded information has been reviewed and considered, and addended by me as needed.

## 2014-11-03 NOTE — Patient Instructions (Signed)
You may have had either an insect bite or small puncture under your foot. cleanse area with soap and water twice per day, keep clean and covered. If any redness or warmth tomorrow - can start antibiotic.  Return to the clinic or go to the nearest emergency room if any of your symptoms worsen or new symptoms occur.  Puncture Wound A puncture wound is an injury that extends through all layers of the skin and into the tissue beneath the skin (subcutaneous tissue). Puncture wounds become infected easily because germs often enter the body and go beneath the skin during the injury. Having a deep wound with a small entrance point makes it difficult for your caregiver to adequately clean the wound. This is especially true if you have stepped on a nail and it has passed through a dirty shoe or other situations where the wound is obviously contaminated. CAUSES  Many puncture wounds involve glass, nails, splinters, fish hooks, or other objects that enter the skin (foreign bodies). A puncture wound may also be caused by a human bite or animal bite. DIAGNOSIS  A puncture wound is usually diagnosed by your history and a physical exam. You may need to have an X-ray or an ultrasound to check for any foreign bodies still in the wound. TREATMENT   Your caregiver will clean the wound as thoroughly as possible. Depending on the location of the wound, a bandage (dressing) may be applied.  Your caregiver might prescribe antibiotic medicines.  You may need a follow-up visit to check on your wound. Follow all instructions as directed by your caregiver. HOME CARE INSTRUCTIONS   Change your dressing once per day, or as directed by your caregiver. If the dressing sticks, it may be removed by soaking the area in water.  If your caregiver has given you follow-up instructions, it is very important that you return for a follow-up appointment. Not following up as directed could result in a chronic or permanent injury, pain, and  disability.  Only take over-the-counter or prescription medicines for pain, discomfort, or fever as directed by your caregiver.  If you are given antibiotics, take them as directed. Finish them even if you start to feel better. You may need a tetanus shot if:  You cannot remember when you had your last tetanus shot.  You have never had a tetanus shot. If you got a tetanus shot, your arm may swell, get red, and feel warm to the touch. This is common and not a problem. If you need a tetanus shot and you choose not to have one, there is a rare chance of getting tetanus. Sickness from tetanus can be serious. You may need a rabies shot if an animal bite caused your puncture wound. SEEK MEDICAL CARE IF:   You have redness, swelling, or increasing pain in the wound.  You have red streaks going away from the wound.  You notice a bad smell coming from the wound or dressing.  You have yellowish-white fluid (pus) coming from the wound.  You are treated with an antibiotic for infection, but the infection is not getting better.  You notice something in the wound, such as rubber from your shoe, cloth, or another object.  You have a fever.  You have severe pain.  You have difficulty breathing.  You feel dizzy or faint.  You cannot stop vomiting.  You lose feeling, develop numbness, or cannot move a limb below the wound.  Your symptoms worsen. MAKE SURE YOU:  Understand  these instructions.  Will watch your condition.  Will get help right away if you are not doing well or get worse. Document Released: 01/30/2005 Document Revised: 07/15/2011 Document Reviewed: 10/09/2010 [facility] Patient Information 2015 [place], [place]. This information is not intended to replace advice given to you by your health care provider. Make sure you discuss any questions you have with your health care provider.

## 2014-11-05 ENCOUNTER — Ambulatory Visit (INDEPENDENT_AMBULATORY_CARE_PROVIDER_SITE_OTHER): Payer: Medicare Other | Admitting: Family Medicine

## 2014-11-05 ENCOUNTER — Ambulatory Visit (INDEPENDENT_AMBULATORY_CARE_PROVIDER_SITE_OTHER): Payer: Medicare Other

## 2014-11-05 VITALS — BP 112/76 | HR 101 | Temp 97.9°F | Resp 17 | Ht 61.5 in | Wt 99.8 lb

## 2014-11-05 DIAGNOSIS — M79672 Pain in left foot: Secondary | ICD-10-CM | POA: Diagnosis not present

## 2014-11-05 DIAGNOSIS — S9032XD Contusion of left foot, subsequent encounter: Secondary | ICD-10-CM

## 2014-11-05 NOTE — Patient Instructions (Addendum)
I do not see any broken bones or specific concerns on your x-ray. This injury may still be from an initial puncture wound with bruising from that initial wound. As it is not warm or other signs of true infection at this point, we can still hold on antibiotic for another day or 2. If you do have more redness or warmth in that area  - go ahead and start the Keflex, then follow-up with me in the next 3 or 4 days if it is not starting to improve. Keep foot up and elevated as possible. Return sooner if worsening.   Puncture Wound A puncture wound is an injury that extends through all layers of the skin and into the tissue beneath the skin (subcutaneous tissue). Puncture wounds become infected easily because germs often enter the body and go beneath the skin during the injury. Having a deep wound with a small entrance point makes it difficult for your caregiver to adequately clean the wound. This is especially true if you have stepped on a nail and it has passed through a dirty shoe or other situations where the wound is obviously contaminated. CAUSES  Many puncture wounds involve glass, nails, splinters, fish hooks, or other objects that enter the skin (foreign bodies). A puncture wound may also be caused by a human bite or animal bite. DIAGNOSIS  A puncture wound is usually diagnosed by your history and a physical exam. You may need to have an X-ray or an ultrasound to check for any foreign bodies still in the wound. TREATMENT   Your caregiver will clean the wound as thoroughly as possible. Depending on the location of the wound, a bandage (dressing) may be applied.  Your caregiver might prescribe antibiotic medicines.  You may need a follow-up visit to check on your wound. Follow all instructions as directed by your caregiver. HOME CARE INSTRUCTIONS   Change your dressing once per day, or as directed by your caregiver. If the dressing sticks, it may be removed by soaking the area in water.  If your  caregiver has given you follow-up instructions, it is very important that you return for a follow-up appointment. Not following up as directed could result in a chronic or permanent injury, pain, and disability.  Only take over-the-counter or prescription medicines for pain, discomfort, or fever as directed by your caregiver.  If you are given antibiotics, take them as directed. Finish them even if you start to feel better. You may need a tetanus shot if:  You cannot remember when you had your last tetanus shot.  You have never had a tetanus shot. If you got a tetanus shot, your arm may swell, get red, and feel warm to the touch. This is common and not a problem. If you need a tetanus shot and you choose not to have one, there is a rare chance of getting tetanus. Sickness from tetanus can be serious. You may need a rabies shot if an animal bite caused your puncture wound. SEEK MEDICAL CARE IF:   You have redness, swelling, or increasing pain in the wound.  You have red streaks going away from the wound.  You notice a bad smell coming from the wound or dressing.  You have yellowish-white fluid (pus) coming from the wound.  You are treated with an antibiotic for infection, but the infection is not getting better.  You notice something in the wound, such as rubber from your shoe, cloth, or another object.  You have a fever.  You have severe pain.  You have difficulty breathing.  You feel dizzy or faint.  You cannot stop vomiting.  You lose feeling, develop numbness, or cannot move a limb below the wound.  Your symptoms worsen. MAKE SURE YOU:  Understand these instructions.  Will watch your condition.  Will get help right away if you are not doing well or get worse. Document Released: 01/30/2005 Document Revised: 07/15/2011 Document Reviewed: 10/09/2010 [facility] Patient Information 2015 [place], [place]. This information is not intended to replace advice given to you by your  health care provider. Make sure you discuss any questions you have with your health care provider.

## 2014-11-05 NOTE — Progress Notes (Addendum)
Subjective:  This chart was scribed for Merri Ray, MD by Thea Alken, ED Scribe. This patient was seen in room 2 and the patient's care was started at 2:08 PM.  Patient ID: Monica [name], female    DOB: 03-Aug-1945, 69 y.o.   MRN: [id number]  HPI   Chief Complaint  Patient presents with  . Follow-up    left foot, spreading, aching getting worse.   Marland Kitchen Headache    started yesterday   . Nausea    started yesterday    HPI Comments: Monica [name] is a 69 y.o. female who presents to the [facility] for a follow up regarding an insect bite to left foot. Pt was seen 2 days ago with pain on the bottom of her left foot. Suspected insect bite while she was bare foot near her door way when cleaning. Thought to be a possible puncture wound. Slight bruising initially but no other signs of obvious infection on exam. Was advised to fill prescription of keflex if symptoms worsened following day. Since last visit, bruising has spread with increased pain, described as achiness, and swelling. She did not start keflex and wanted to make sure she needed medication before filling prescription. She has had headache and nausea but unsure if this is related to foot pain due to having similar symptoms prior. She denies fevers, abdominal pain and emesis.   Patient Active Problem List   Diagnosis Date Noted  . Chest pain, atypical 04/13/2014  . Healthcare maintenance 02/16/2013  . Elevated CA-125 02/16/2013  . IBS (irritable bowel syndrome) 11/25/2010  . ANXIETY DISORDER, SITUATIONAL, MILD 10/20/2009  . ALLERGIC RHINITIS, SEASONAL 04/23/2007  . GERD 03/10/2007  . Insomnia due to stress 01/26/2007  . HEARING LOSS, LEFT EAR 01/26/2007  . SINUSITIS, CHRONIC 01/26/2007  . Bronchiectasis without acute exacerbation 01/26/2007  . TMJ SYNDROME 01/26/2007  . MENOPAUSAL DISORDER 01/26/2007  . OSTEOPOROSIS 01/26/2007  . PALPITATIONS 01/26/2007   Past Medical History  Diagnosis Date  .  Allergic rhinitis   . GERD (gastroesophageal reflux disease)   . Menopausal disorder   . Insomnia   . Hemorrhoids   . Bronchiectasis   . Hearing loss   . TMJ syndrome   . Palpitations   . Urticaria   . Chronic sinusitis   . Osteoporosis   . Diverticulosis   . Adenomatous colon polyp 10/2003  . Allergy     SEASONAL   Past Surgical History  Procedure Laterality Date  . Tympanostomy    . Nasal sinus surgery    . Colonoscopy     Allergies  Allergen Reactions  . Avelox [Moxifloxacin Hcl In Nacl] Nausea Only and Other (See Comments)    GI UPSET  . Amoxicillin-Pot Clavulanate     REACTION: GI Upset  . Bactrim [Sulfamethoxazole-Trimethoprim]     flushing  . Benzonatate     REACTION: felt bad, out of sorts, disoriented.  . Ciprofloxacin     REACTION: rash  . Sulfonamide Derivatives     Felt flushed and a little palpitations.    Prior to Admission medications   Medication Sig Start Date End Date Taking? Authorizing Provider  acetaminophen (TYLENOL) 500 MG tablet Take 500 mg by mouth every 6 (six) hours as needed for moderate pain or headache.   Yes Historical Provider, MD  calcium carbonate (TUMS) 500 MG chewable tablet Chew 1 tablet (200 mg of elemental calcium total) by mouth daily. 04/13/14  Yes Cassandria Anger, MD  cephALEXin (KEFLEX) 500 MG capsule Take 1 capsule (500 mg total) by mouth 2 (two) times daily. 11/03/14  Yes Wendie Agreste, MD  cetirizine (ZYRTEC) 10 MG tablet Take 5 mg by mouth daily as needed for allergies.   Yes Historical Provider, MD  Cholecalciferol (VITAMIN D) 1000 UNITS capsule Take 1,000 Units by mouth daily.     Yes Historical Provider, MD  Estradiol 10 MCG TABS Place 1 tablet vaginally 2 (two) times a week.    Yes Historical Provider, MD  fluticasone (FLONASE) 50 MCG/ACT nasal spray Place 2 sprays into the nose as needed for allergies or rhinitis.  03/05/11  Yes Elsie Stain, MD  Lactobacillus Rhamnosus, GG, (CULTURELLE PO) Take 1 tablet by  mouth daily.   Yes Historical Provider, MD  Multiple Vitamins-Minerals (CENTRUM SILVER PO) Take 1 tablet by mouth twice a week   Yes Historical Provider, MD  omeprazole (PRILOSEC) 20 MG capsule Take 20 mg by mouth daily as needed (acid reflux).    Yes Historical Provider, MD   History   Social History  . Marital Status: Married    Spouse Name: [name]  . Number of Children: 0  . Years of Education: N/A   Occupational History  . retired Therapist, sports    Social History Main Topics  . Smoking status: Never Smoker   . Smokeless tobacco: Never Used  . Alcohol Use: 1.2 oz/week    2 Glasses of wine per week  . Drug Use: No  . Sexual Activity: Not on file   Other Topics Concern  . Not on file   Social History Narrative   Review of Systems  Constitutional: Negative for fever and chills.  Gastrointestinal: Positive for nausea. Negative for vomiting and abdominal pain.  Skin: Positive for color change.  Neurological: Positive for headaches.    Objective:  Physical Exam  Constitutional: She is oriented to person, place, and time. She appears well-developed and well-nourished. No distress.  HENT:  Head: Normocephalic and atraumatic.  Eyes: Conjunctivae and EOM are normal.  Neck: Neck supple.  Cardiovascular: Normal rate.   Pulmonary/Chest: Effort normal.  Musculoskeletal: Normal range of motion.  Neurological: She is alert and oriented to person, place, and time.  Skin: Skin is warm and dry.  Darkening, slight redness on lateral aspect of distal left foot extending from the base of 5th toe to lateral foot measuring 5cm and also extends to base of distal left foot measuring 5cm x 5cm there is some slight erythema spreading intradigital and base of 4th toe, dorsally only. negative vibratory fork testing. No bony tenderness. Flexion and extension of the toes are intact.  Psychiatric: She has a normal mood and affect. Her behavior is normal.  Nursing note and vitals reviewed.  capillary refill less  than 1 second at toes  Filed Vitals:   11/05/14 1334  BP: 112/76  Pulse: 101  Temp: 97.9 F (36.6 C)  TempSrc: Oral  Resp: 17  Height: 5' 1.5" (1.562 m)  Weight: 99 lb 12.8 oz (45.269 kg)  SpO2: 99%   [facility] reading (PRIMARY) by  Dr. Carlota Raspberry: L foot: no acute bony findings or fracture identified.    Assessment & Plan:  Monica [name] is a 69 y.o. female Left foot pain - Plan: DG Foot Complete Left, CANCELED: DG Foot 2 Views Left  Bruised sole of foot, left, subsequent encounter  Still appears to be more bruising than true infection. No appreciable warmth, and second M.D. exam was performed. No apparent  bony findings or foreign body on x-ray. Continue symptomatic care, elevate foot as able, and relative rest. She has Keflex to start if more redness warmth or signs of infection.  Return to clinic if continues to enlarge or worsening over next few days.  No orders of the defined types were placed in this encounter.   Patient Instructions  I do not see any broken bones or specific concerns on your x-ray. This injury may still be from an initial puncture wound with bruising from that initial wound. As it is not warm or other signs of true infection at this point, we can still hold on antibiotic for another day or 2. If you do have more redness or warmth in that area  - go ahead and start the Keflex, then follow-up with me in the next 3 or 4 days if it is not starting to improve. Keep foot up and elevated as possible. Return sooner if worsening.   Puncture Wound A puncture wound is an injury that extends through all layers of the skin and into the tissue beneath the skin (subcutaneous tissue). Puncture wounds become infected easily because germs often enter the body and go beneath the skin during the injury. Having a deep wound with a small entrance point makes it difficult for your caregiver to adequately clean the wound. This is especially true if you have stepped on a nail and it has  passed through a dirty shoe or other situations where the wound is obviously contaminated. CAUSES  Many puncture wounds involve glass, nails, splinters, fish hooks, or other objects that enter the skin (foreign bodies). A puncture wound may also be caused by a human bite or animal bite. DIAGNOSIS  A puncture wound is usually diagnosed by your history and a physical exam. You may need to have an X-ray or an ultrasound to check for any foreign bodies still in the wound. TREATMENT   Your caregiver will clean the wound as thoroughly as possible. Depending on the location of the wound, a bandage (dressing) may be applied.  Your caregiver might prescribe antibiotic medicines.  You may need a follow-up visit to check on your wound. Follow all instructions as directed by your caregiver. HOME CARE INSTRUCTIONS   Change your dressing once per day, or as directed by your caregiver. If the dressing sticks, it may be removed by soaking the area in water.  If your caregiver has given you follow-up instructions, it is very important that you return for a follow-up appointment. Not following up as directed could result in a chronic or permanent injury, pain, and disability.  Only take over-the-counter or prescription medicines for pain, discomfort, or fever as directed by your caregiver.  If you are given antibiotics, take them as directed. Finish them even if you start to feel better. You may need a tetanus shot if:  You cannot remember when you had your last tetanus shot.  You have never had a tetanus shot. If you got a tetanus shot, your arm may swell, get red, and feel warm to the touch. This is common and not a problem. If you need a tetanus shot and you choose not to have one, there is a rare chance of getting tetanus. Sickness from tetanus can be serious. You may need a rabies shot if an animal bite caused your puncture wound. SEEK MEDICAL CARE IF:   You have redness, swelling, or increasing  pain in the wound.  You have red streaks going away from  the wound.  You notice a bad smell coming from the wound or dressing.  You have yellowish-white fluid (pus) coming from the wound.  You are treated with an antibiotic for infection, but the infection is not getting better.  You notice something in the wound, such as rubber from your shoe, cloth, or another object.  You have a fever.  You have severe pain.  You have difficulty breathing.  You feel dizzy or faint.  You cannot stop vomiting.  You lose feeling, develop numbness, or cannot move a limb below the wound.  Your symptoms worsen. MAKE SURE YOU:  Understand these instructions.  Will watch your condition.  Will get help right away if you are not doing well or get worse. Document Released: 01/30/2005 Document Revised: 07/15/2011 Document Reviewed: 10/09/2010 [facility] Patient Information 2015 [place], [place]. This information is not intended to replace advice given to you by your health care provider. Make sure you discuss any questions you have with your health care provider.       I personally performed the services described in this documentation, which was scribed in my presence. The recorded information has been reviewed and considered, and addended by me as needed.

## 2014-11-11 DIAGNOSIS — L739 Follicular disorder, unspecified: Secondary | ICD-10-CM | POA: Diagnosis not present

## 2014-11-14 DIAGNOSIS — N907 Vulvar cyst: Secondary | ICD-10-CM | POA: Diagnosis not present

## 2014-11-21 DIAGNOSIS — H9042 Sensorineural hearing loss, unilateral, left ear, with unrestricted hearing on the contralateral side: Secondary | ICD-10-CM | POA: Diagnosis not present

## 2014-11-21 DIAGNOSIS — J309 Allergic rhinitis, unspecified: Secondary | ICD-10-CM | POA: Diagnosis not present

## 2014-11-21 DIAGNOSIS — H6061 Unspecified chronic otitis externa, right ear: Secondary | ICD-10-CM | POA: Diagnosis not present

## 2014-11-21 DIAGNOSIS — H6981 Other specified disorders of Eustachian tube, right ear: Secondary | ICD-10-CM | POA: Diagnosis not present

## 2014-12-11 ENCOUNTER — Encounter: Payer: Self-pay | Admitting: Internal Medicine

## 2014-12-22 DIAGNOSIS — J309 Allergic rhinitis, unspecified: Secondary | ICD-10-CM | POA: Diagnosis not present

## 2014-12-22 DIAGNOSIS — H6981 Other specified disorders of Eustachian tube, right ear: Secondary | ICD-10-CM | POA: Diagnosis not present

## 2014-12-22 DIAGNOSIS — H65194 Other acute nonsuppurative otitis media, recurrent, right ear: Secondary | ICD-10-CM | POA: Diagnosis not present

## 2014-12-22 DIAGNOSIS — H9042 Sensorineural hearing loss, unilateral, left ear, with unrestricted hearing on the contralateral side: Secondary | ICD-10-CM | POA: Diagnosis not present

## 2015-01-12 DIAGNOSIS — H9042 Sensorineural hearing loss, unilateral, left ear, with unrestricted hearing on the contralateral side: Secondary | ICD-10-CM | POA: Diagnosis not present

## 2015-01-12 DIAGNOSIS — H65194 Other acute nonsuppurative otitis media, recurrent, right ear: Secondary | ICD-10-CM | POA: Diagnosis not present

## 2015-01-12 DIAGNOSIS — H65115 Acute and subacute allergic otitis media (mucoid) (sanguinous) (serous), recurrent, left ear: Secondary | ICD-10-CM | POA: Diagnosis not present

## 2015-01-12 DIAGNOSIS — J309 Allergic rhinitis, unspecified: Secondary | ICD-10-CM | POA: Diagnosis not present

## 2015-01-12 DIAGNOSIS — H6981 Other specified disorders of Eustachian tube, right ear: Secondary | ICD-10-CM | POA: Diagnosis not present

## 2015-02-01 DIAGNOSIS — H6063 Unspecified chronic otitis externa, bilateral: Secondary | ICD-10-CM | POA: Diagnosis not present

## 2015-02-01 DIAGNOSIS — H6981 Other specified disorders of Eustachian tube, right ear: Secondary | ICD-10-CM | POA: Diagnosis not present

## 2015-02-01 DIAGNOSIS — H65115 Acute and subacute allergic otitis media (mucoid) (sanguinous) (serous), recurrent, left ear: Secondary | ICD-10-CM | POA: Diagnosis not present

## 2015-02-01 DIAGNOSIS — H9042 Sensorineural hearing loss, unilateral, left ear, with unrestricted hearing on the contralateral side: Secondary | ICD-10-CM | POA: Diagnosis not present

## 2015-02-01 DIAGNOSIS — J309 Allergic rhinitis, unspecified: Secondary | ICD-10-CM | POA: Diagnosis not present

## 2015-02-02 ENCOUNTER — Ambulatory Visit (INDEPENDENT_AMBULATORY_CARE_PROVIDER_SITE_OTHER): Payer: Medicare Other

## 2015-02-02 DIAGNOSIS — Z23 Encounter for immunization: Secondary | ICD-10-CM

## 2015-02-10 ENCOUNTER — Other Ambulatory Visit: Payer: Self-pay | Admitting: Otolaryngology

## 2015-02-10 DIAGNOSIS — H6692 Otitis media, unspecified, left ear: Secondary | ICD-10-CM

## 2015-02-17 ENCOUNTER — Ambulatory Visit
Admission: RE | Admit: 2015-02-17 | Discharge: 2015-02-17 | Disposition: A | Discharge status: Home / Self Care | Payer: Medicare Other | Source: Ambulatory Visit | Attending: Otolaryngology | Admitting: Otolaryngology

## 2015-02-17 DIAGNOSIS — H6692 Otitis media, unspecified, left ear: Secondary | ICD-10-CM

## 2015-02-17 MED ORDER — IOPAMIDOL (ISOVUE-300) INJECTION 61%
75.0000 mL | Freq: Once | INTRAVENOUS | Status: AC | PRN
  Administered 2015-02-17: 75 mL via INTRAVENOUS

## 2015-02-22 DIAGNOSIS — J309 Allergic rhinitis, unspecified: Secondary | ICD-10-CM | POA: Diagnosis not present

## 2015-02-22 DIAGNOSIS — H65194 Other acute nonsuppurative otitis media, recurrent, right ear: Secondary | ICD-10-CM | POA: Diagnosis not present

## 2015-02-22 DIAGNOSIS — H6063 Unspecified chronic otitis externa, bilateral: Secondary | ICD-10-CM | POA: Diagnosis not present

## 2015-02-22 DIAGNOSIS — H7011 Chronic mastoiditis, right ear: Secondary | ICD-10-CM | POA: Diagnosis not present

## 2015-02-22 DIAGNOSIS — H6981 Other specified disorders of Eustachian tube, right ear: Secondary | ICD-10-CM | POA: Diagnosis not present

## 2015-02-22 DIAGNOSIS — H9042 Sensorineural hearing loss, unilateral, left ear, with unrestricted hearing on the contralateral side: Secondary | ICD-10-CM | POA: Diagnosis not present

## 2015-03-10 DIAGNOSIS — H6063 Unspecified chronic otitis externa, bilateral: Secondary | ICD-10-CM | POA: Diagnosis not present

## 2015-03-10 DIAGNOSIS — H9042 Sensorineural hearing loss, unilateral, left ear, with unrestricted hearing on the contralateral side: Secondary | ICD-10-CM | POA: Diagnosis not present

## 2015-03-13 ENCOUNTER — Ambulatory Visit (INDEPENDENT_AMBULATORY_CARE_PROVIDER_SITE_OTHER): Payer: Medicare Other | Admitting: Internal Medicine

## 2015-03-13 ENCOUNTER — Encounter: Payer: Self-pay | Admitting: Internal Medicine

## 2015-03-13 VITALS — BP 121/73 | HR 87 | Temp 98.0°F | Ht 62.0 in | Wt 102.2 lb

## 2015-03-13 DIAGNOSIS — H7091 Unspecified mastoiditis, right ear: Secondary | ICD-10-CM | POA: Diagnosis present

## 2015-03-13 MED ORDER — CIPROFLOXACIN HCL 500 MG PO TABS: 500.0000 mg | ORAL_TABLET | Freq: Two times a day (BID) | ORAL | Status: DC

## 2015-03-14 NOTE — Progress Notes (Signed)
[facility]  Reason for Consult: Right mastoiditis and otorrhea Referring Physician: Dr. Vicie Mutters  Patient Active Problem List   Diagnosis Date Noted  . Mastoiditis of right side 03/13/2015    Priority: High  . Chest pain, atypical 04/13/2014  . Healthcare maintenance 02/16/2013  . Elevated CA-125 02/16/2013  . IBS (irritable bowel syndrome) 11/25/2010  . ANXIETY DISORDER, SITUATIONAL, MILD 10/20/2009  . ALLERGIC RHINITIS, SEASONAL 04/23/2007  . GERD 03/10/2007  . Insomnia due to stress 01/26/2007  . HEARING LOSS, LEFT EAR 01/26/2007  . SINUSITIS, CHRONIC 01/26/2007  . Bronchiectasis without acute exacerbation ([place]) 01/26/2007  . TMJ SYNDROME 01/26/2007  . MENOPAUSAL DISORDER 01/26/2007  . OSTEOPOROSIS 01/26/2007  . PALPITATIONS 01/26/2007    Patient's Medications  New Prescriptions   CIPROFLOXACIN (CIPRO) 500 MG TABLET    Take 1 tablet (500 mg total) by mouth 2 (two) times daily.  Previous Medications   ACETAMINOPHEN (TYLENOL) 500 MG TABLET    Take 500 mg by mouth every 6 (six) hours as needed for moderate pain or headache.   CALCIUM CARBONATE (TUMS) 500 MG CHEWABLE TABLET    Chew 1 tablet (200 mg of elemental calcium total) by mouth daily.   CETIRIZINE (ZYRTEC) 10 MG TABLET    Take 5 mg by mouth daily as needed for allergies.   CHOLECALCIFEROL (VITAMIN D) 1000 UNITS CAPSULE    Take 1,000 Units by mouth daily.     ESTRADIOL 10 MCG TABS    Place 1 tablet vaginally 2 (two) times a week.    FLUTICASONE (FLONASE) 50 MCG/ACT NASAL SPRAY    Place 2 sprays into the nose as needed for allergies or rhinitis.    LACTOBACILLUS RHAMNOSUS, GG, (CULTURELLE PO)    Take 1 tablet by mouth daily.   MULTIPLE VITAMINS-MINERALS (CENTRUM SILVER PO)    Take 1 tablet by mouth twice a week   OMEPRAZOLE (PRILOSEC) 20 MG CAPSULE    Take 20 mg by mouth daily as needed (acid reflux).   Modified Medications   No medications on file  Discontinued Medications   CEPHALEXIN (KEFLEX) 500 MG CAPSULE    Take 1 capsule (500 mg total) by mouth 2 (two) times daily.    Recommendations: 1. Stop TobraDex drops now 2. Start ciprofloxacin 500 mg twice daily in 3 days and take for 2 weeks 3. Follow-up with me in 2 weeks   Assessment: It is not entirely clear to me what is causing her right mastoid air cell effusion and clear otorrhea. The fact that the otorrhea improves with antibiotics suggests the possibility of middle ear infection although this is somewhat atypical. I have reviewed her previous reactions to antibiotics. She is not eager to try intravenous antibiotics yet. I suggested that she stop the TobraDex drops for a few days to see if the otorrhea returns and then start a trial of oral ciprofloxacin. She knows to call me right away if she has any adverse reaction. She will follow-up with me in 2 weeks.   HPI: Monica [name] is a 69 y.o. female with a long history of "sinus problems and previous sinus surgeries". She has had chronic hearing loss and tinnitus in her left ear that has been stable. Several months ago she developed a new problems in her right ear. She began to have some tenderness as well as feeling that her right ear was in "plugged up". She also started having intermittent clear drainage from her  right ear. She has had a right tympanostomy tube for over a decade because of problems equalizing pressure in her ear when she travels by airplane. She is also noted some gradually decrease hearing in her right ear over the past few months. She has had 4 rounds of oral antibiotic therapy including azithromycin for 3 days, azithromycin for 5 days, tetracycline for 10 days, and most recently azithromycin again for 5 days. She completed that about 3-4 weeks ago. She has also continued to use TobraDex drops. She notes that when she is on systemic and topical antibiotics the otorrhea goes away but she does not note any change in her other symptoms. She has not had  any pain in her right ear. She has no new sinus congestion. She's not had any fever, chills or sweats. No recent head CT scan showed some new fluid in her right mastoid air cells. Paranasal sinuses were clear. There is no bony erosion noted.  She has an extensive history of allergies/intolerances to antibiotics. She recalls flushing after she took trimethoprim sulfamethoxazole. She has had nausea the last time she took amoxicillin clavulanate and moxifloxacin. Her records indicate a rash when she took ciprofloxacin several years ago. However she states that the rash was 8 and only on one area of her lower abdomen. She recalls that she was wearing a new shirt and she wondered if the rash was related to irritation from the shirt. She recalls that her primary care physician felt like this was probably the case as well but rash was still attributed to ciprofloxacin and her records.  Review of Systems: Review of Systems  Constitutional: Negative for fever, chills, weight loss, malaise/fatigue and diaphoresis.  HENT: Negative for sore throat.   Respiratory: Negative for cough, sputum production and shortness of breath.   Cardiovascular: Negative for chest pain.  Gastrointestinal: Negative for nausea, vomiting and diarrhea.  Genitourinary: Negative for dysuria and frequency.  Musculoskeletal: Negative for myalgias and joint pain.  Skin: Negative for rash.  Neurological: Negative for focal weakness and headaches.  Psychiatric/Behavioral: Negative for depression and substance abuse. The patient is not nervous/anxious.       Past Medical History  Diagnosis Date  . Allergic rhinitis   . GERD (gastroesophageal reflux disease)   . Menopausal disorder   . Insomnia   . Hemorrhoids   . Bronchiectasis   . Hearing loss   . TMJ syndrome   . Palpitations   . Urticaria   . Chronic sinusitis   . Osteoporosis   . Diverticulosis   . Adenomatous colon polyp 10/2003  . Allergy     SEASONAL    Social  History  Substance Use Topics  . Smoking status: Never Smoker   . Smokeless tobacco: Never Used  . Alcohol Use: 1.2 oz/week    2 Glasses of wine per week    Family History  Problem Relation Age of Onset  . Rectal cancer Mother     died age 61  . Seizures Mother   . Colon cancer Mother 87  . Cancer Mother   . Heart disease Father     dies age 89  . Hypertension Father   . Heart disease Maternal Grandfather   . Cancer Paternal Uncle     multiple uncles with cancer  . Cancer Maternal Grandmother    Allergies  Allergen Reactions  . Amoxicillin-Pot Clavulanate Nausea Only  . Avelox [Moxifloxacin Hcl In Nacl] Nausea Only  . Bactrim [Sulfamethoxazole-Trimethoprim] Other (See Comments)  flushing  . Benzonatate Other (See Comments)    felt bad, out of sorts, disoriented.  . Sulfonamide Derivatives Palpitations and Other (See Comments)    Felt flushed    OBJECTIVE: Filed Vitals:   03/13/15 1400  BP: 121/73  Pulse: 87  Temp: 98 F (36.7 C)  TempSrc: Oral  Height: 5\' 2"  (1.575 m)  Weight: 102 lb 4 oz (46.38 kg)   Body mass index is 18.7 kg/(m^2).   Physical Exam  Constitutional: No distress.  HENT:  Right Ear: No drainage, swelling or tenderness. Tympanic membrane is not injected. No middle ear effusion.  Left Ear: No drainage, swelling or tenderness. Tympanic membrane is not injected.  No middle ear effusion.  Mouth/Throat: No oropharyngeal exudate.  A blue tympanostomy tube is noted in her right tympanic membrane. There is no tenderness, redness or swelling over her right mastoid.  Eyes: Conjunctivae are normal.  Neck: Neck supple.  Cardiovascular: Normal rate, regular rhythm and normal heart sounds.   No murmur heard. Pulmonary/Chest: Breath sounds normal.  Lymphadenopathy:    She has no cervical adenopathy.  Skin: No rash noted.  Psychiatric: Mood and affect normal.    Microbiology: No results found for this or any previous visit (from the past 240  hour(s)).  Michel Bickers, MD [facility] [phone] pager   [phone] cell 03/14/2015, 1:27 PM

## 2015-03-14 NOTE — Assessment & Plan Note (Signed)
It is not entirely clear to me what is causing her right mastoid air cell effusion and clear otorrhea. The fact that the otorrhea improves with antibiotics suggests the possibility of middle ear infection although this is somewhat atypical. I have reviewed her previous reactions to antibiotics. She is not eager to try intravenous antibiotics yet. I suggested that she stop the TobraDex drops for a few days to see if the otorrhea returns and then start a trial of oral ciprofloxacin. She knows to call me right away if she has any adverse reaction. She will follow-up with me in 2 weeks.

## 2015-03-20 ENCOUNTER — Telehealth: Payer: Self-pay | Admitting: *Deleted

## 2015-03-20 DIAGNOSIS — T7840XD Allergy, unspecified, subsequent encounter: Secondary | ICD-10-CM

## 2015-03-20 DIAGNOSIS — H7091 Unspecified mastoiditis, right ear: Secondary | ICD-10-CM

## 2015-03-20 MED ORDER — CEFPODOXIME PROXETIL 200 MG PO TABS: 200.0000 mg | ORAL_TABLET | Freq: Two times a day (BID) | ORAL | Status: DC

## 2015-03-20 NOTE — Telephone Encounter (Signed)
Ms. Babiak started ciprofloxacin on 03/18/2015. She woke up yesterday, 03/19/2015, with a pruritic rash on her trunk. She stopped taking ciprofloxacin but the rash persists today. She is getting some relief from diphenhydramine. This is similar to the reaction she had previously with ciprofloxacin which she thought was simply irritation from a new shirt she was wearing. I have put ciprofloxacin back on her allergy list. I will treat her with Cefpodoxime for 10 days.

## 2015-03-20 NOTE — Telephone Encounter (Signed)
Patient called stating she has an itchy rash all over the trunk of her body twelve hours after starting the cipro. She is taking benadryl and she wants to know about changing antibiotics. She does not want to change until after the rash subsides; please advise. Myrtis Hopping

## 2015-03-22 DIAGNOSIS — T7840XA Allergy, unspecified, initial encounter: Secondary | ICD-10-CM | POA: Insufficient documentation

## 2015-03-22 MED ORDER — PROMETHAZINE HCL 12.5 MG PO TABS
12.5000 mg | ORAL_TABLET | Freq: Four times a day (QID) | ORAL | Status: DC | PRN
Start: 2015-03-22 — End: 2015-09-07

## 2015-03-22 MED ORDER — ONDANSETRON 4 MG PREPACK (~~LOC~~): 1.0000 | ORAL_TABLET | Freq: Three times a day (TID) | ORAL | Status: DC | PRN

## 2015-03-22 NOTE — Telephone Encounter (Signed)
Please let her know that I sent a prescription for Phenergan to take as needed for nausea to [facility]. Also let her know that if the itchy rash is related to the ciprofloxacin that she took last weekend it should resolve soon. Please have her call us if she is not getting better soon.

## 2015-03-22 NOTE — Telephone Encounter (Signed)
Patient would like Dr. Megan Salon to know that her rash is continuing.  It is a itchy, raised/bumpy rash on her torso stretching to her lumbar region with isolated spots elsewhere on her body.  She is experiencing nausea as well.  She has been using benadryl and cortisone cream, but they are not helping.  She will not start the new antibiotic until the symptoms resolve.  Please advise if there is anything else she can do. Landis Gandy, RN

## 2015-03-22 NOTE — Addendum Note (Signed)
Addended by: Michel Bickers on: 03/22/2015 12:36 PM   Modules accepted: Orders, Medications

## 2015-03-23 ENCOUNTER — Telehealth: Payer: Self-pay | Admitting: *Deleted

## 2015-03-23 NOTE — Telephone Encounter (Signed)
Rash/itching slightly improved.  Picked up phenergan and Cefpodoxime this morning.  Pt will start both today.  She stated that she will stop the OTC Benadryl and continue the cortisone cream for the itching.

## 2015-04-04 ENCOUNTER — Ambulatory Visit (INDEPENDENT_AMBULATORY_CARE_PROVIDER_SITE_OTHER): Payer: Medicare Other | Admitting: Internal Medicine

## 2015-04-04 ENCOUNTER — Encounter: Payer: Self-pay | Admitting: Internal Medicine

## 2015-04-04 VITALS — BP 108/72 | HR 89 | Temp 97.8°F | Ht 62.0 in | Wt 102.8 lb

## 2015-04-04 DIAGNOSIS — H7091 Unspecified mastoiditis, right ear: Secondary | ICD-10-CM

## 2015-04-04 NOTE — Assessment & Plan Note (Signed)
She has history of chronic hearing loss, tinnitus and fullness in her left ear and recently began to have similar symptoms in her right ear. A CT scan done in early October showed some air-fluid levels in her right mastoid air cells but no other acute abnormalities. She has received 4 different rounds of oral antibiotic therapy. The clear otorrhea she had been having has stopped but her other symptoms are getting worse despite antibiotics. I am not sure how isolated mastoiditis would cause her acute symptoms and I am somewhat skeptical that another round of antibiotic therapy would be helpful. She is also somewhat reluctant to continue taking antibiotics because of concerns that they will not help and could cause problems like C. difficile colitis. She will stay off of antibiotics for now. I will discuss the situation with her ENT physician, Dr. Vicie Mutters.

## 2015-04-04 NOTE — Progress Notes (Addendum)
[facility]  Patient Active Problem List   Diagnosis Date Noted  . Mastoiditis of right side 03/13/2015    Priority: High  . Allergic reaction 03/22/2015    Priority: Medium  . Chest pain, atypical 04/13/2014  . Healthcare maintenance 02/16/2013  . Elevated CA-125 02/16/2013  . IBS (irritable bowel syndrome) 11/25/2010  . ANXIETY DISORDER, SITUATIONAL, MILD 10/20/2009  . ALLERGIC RHINITIS, SEASONAL 04/23/2007  . GERD 03/10/2007  . Insomnia due to stress 01/26/2007  . HEARING LOSS, LEFT EAR 01/26/2007  . SINUSITIS, CHRONIC 01/26/2007  . Bronchiectasis without acute exacerbation ([place]) 01/26/2007  . TMJ SYNDROME 01/26/2007  . MENOPAUSAL DISORDER 01/26/2007  . OSTEOPOROSIS 01/26/2007  . PALPITATIONS 01/26/2007    Patient's Medications  New Prescriptions   No medications on file  Previous Medications   ACETAMINOPHEN (TYLENOL) 500 MG TABLET    Take 500 mg by mouth every 6 (six) hours as needed for moderate pain or headache.   CALCIUM CARBONATE (TUMS) 500 MG CHEWABLE TABLET    Chew 1 tablet (200 mg of elemental calcium total) by mouth daily.   CETIRIZINE (ZYRTEC) 10 MG TABLET    Take 5 mg by mouth daily as needed for allergies.   CHOLECALCIFEROL (VITAMIN D) 1000 UNITS CAPSULE    Take 1,000 Units by mouth daily.     ESTRADIOL 10 MCG TABS    Place 1 tablet vaginally 2 (two) times a week.    FLUTICASONE (FLONASE) 50 MCG/ACT NASAL SPRAY    Place 2 sprays into the nose as needed for allergies or rhinitis.    LACTOBACILLUS RHAMNOSUS, GG, (CULTURELLE PO)    Take 1 tablet by mouth daily.   MULTIPLE VITAMINS-MINERALS (CENTRUM SILVER PO)    Take 1 tablet by mouth twice a week   OMEPRAZOLE (PRILOSEC) 20 MG CAPSULE    Take 20 mg by mouth daily as needed (acid reflux).    PROMETHAZINE (PHENERGAN) 12.5 MG TABLET    Take 1 tablet (12.5 mg total) by mouth every 6 (six) hours as needed for nausea or vomiting.  Modified Medications   No medications on file    Discontinued Medications   CEFPODOXIME (VANTIN) 200 MG TABLET    Take 1 tablet (200 mg total) by mouth 2 (two) times daily.   CIPROFLOXACIN (CIPRO) 500 MG TABLET    Take 1 tablet (500 mg total) by mouth 2 (two) times daily.    Subjective: Monica [name] is in for her routine follow-up visit. She completed a ten-day course of oral Cefpodoxime yesterday for her right mastoiditis. She had stopped ciprofloxacin ear drops prior to starting her oral antibiotic. She has not had any further otorrhea. However, her hearing loss and tinnitus in her right ear associated with a sensation of fullness is getting worse. She has had similar symptoms in her left ear for decades and notes that they are getting worse on that side as well. She is not having any headache or ear pain. She has not had any fever, chills or sweats. Prior to taking Cefpodoxime she had taken 2 rounds of azithromycin and one round of tetracycline without improvement.  Review of Systems: Review of Systems  Constitutional: Negative for fever, chills and diaphoresis.  HENT: Positive for hearing loss and tinnitus. Negative for congestion, ear discharge and ear pain.   Gastrointestinal: Negative for nausea, vomiting and diarrhea.  Neurological: Negative for headaches.    Past Medical History  Diagnosis Date  . Allergic rhinitis   .  GERD (gastroesophageal reflux disease)   . Menopausal disorder   . Insomnia   . Hemorrhoids   . Bronchiectasis   . Hearing loss   . TMJ syndrome   . Palpitations   . Urticaria   . Chronic sinusitis   . Osteoporosis   . Diverticulosis   . Adenomatous colon polyp 10/2003  . Allergy     SEASONAL    Social History  Substance Use Topics  . Smoking status: Never Smoker   . Smokeless tobacco: Never Used  . Alcohol Use: 1.2 oz/week    2 Glasses of wine per week    Family History  Problem Relation Age of Onset  . Rectal cancer Mother     died age 26  . Seizures Mother   . Colon cancer Mother 26  .  Cancer Mother   . Heart disease Father     dies age 29  . Hypertension Father   . Heart disease Maternal Grandfather   . Cancer Paternal Uncle     multiple uncles with cancer  . Cancer Maternal Grandmother     Allergies  Allergen Reactions  . Amoxicillin-Pot Clavulanate Diarrhea  . Avelox [Moxifloxacin Hcl In Nacl] Nausea Only  . Bactrim [Sulfamethoxazole-Trimethoprim] Other (See Comments)    flushing  . Benzonatate Other (See Comments)    felt bad, out of sorts, disoriented.  . Ciprofloxacin Rash  . Sulfonamide Derivatives Palpitations and Other (See Comments)    Felt flushed    Objective: Filed Vitals:   04/04/15 1500  BP: 108/72  Pulse: 89  Temp: 97.8 F (36.6 C)  Height: 5\' 2"  (1.575 m)  Weight: 102 lb 12.8 oz (46.63 kg)   Body mass index is 18.8 kg/(m^2).  Physical Exam  Constitutional:  She is calm, pleasant and in no distress.  HENT:  Left Ear: External ear normal.  Mouth/Throat: No oropharyngeal exudate.  Examination of her right ear shows her tympanostomy tube in place. There is no obvious drainage or other acute abnormalities. She has no tenderness, redness or swelling over the mastoid air cells.  Cardiovascular: Normal rate and regular rhythm.   No murmur heard. Pulmonary/Chest: Breath sounds normal.  Skin: No rash noted.  Psychiatric: Mood and affect normal.    Lab Results    Problem List Items Addressed This Visit      High   Mastoiditis of right side - Primary    She has history of chronic hearing loss, tinnitus and fullness in her left ear and recently began to have similar symptoms in her right ear. A CT scan done in early October showed some air-fluid levels in her right mastoid air cells but no other acute abnormalities. She has received 4 different rounds of oral antibiotic therapy. The clear otorrhea she had been having has stopped but her other symptoms are getting worse despite antibiotics. I am not sure how isolated mastoiditis would  cause her acute symptoms and I am somewhat skeptical that another round of antibiotic therapy would be helpful. She is also somewhat reluctant to continue taking antibiotics because of concerns that they will not help and could cause problems like C. difficile colitis. She will stay off of antibiotics for now. I will discuss the situation with her ENT physician, Dr. Vicie Mutters.          Michel Bickers, MD [facility] [place] [facility] [phone] pager   [phone] cell 04/04/2015, 3:41 PM   I was able to talk to Dr.  Thornell Mule about the situation. We agreed with repeating her CT scan to review the changes in her right mastoid air cells before making final decisions about next steps. I spoke with Monica [name] today to review that plan with her. She is in agreement.  Michel Bickers, MD [facility] [place] [facility] [phone] pager   [phone] cell 04/05/2015, 4:59 PM

## 2015-04-07 ENCOUNTER — Other Ambulatory Visit: Payer: Self-pay | Admitting: Otolaryngology

## 2015-04-07 DIAGNOSIS — H7011 Chronic mastoiditis, right ear: Secondary | ICD-10-CM

## 2015-04-17 ENCOUNTER — Ambulatory Visit
Admission: RE | Admit: 2015-04-17 | Discharge: 2015-04-17 | Disposition: A | Discharge status: Home / Self Care | Payer: Medicare Other | Source: Ambulatory Visit | Attending: Otolaryngology | Admitting: Otolaryngology

## 2015-04-17 DIAGNOSIS — H7011 Chronic mastoiditis, right ear: Secondary | ICD-10-CM

## 2015-04-17 DIAGNOSIS — H748X1 Other specified disorders of right middle ear and mastoid: Secondary | ICD-10-CM | POA: Diagnosis not present

## 2015-04-17 MED ORDER — IOPAMIDOL (ISOVUE-300) INJECTION 61%
75.0000 mL | Freq: Once | INTRAVENOUS | Status: AC | PRN
  Administered 2015-04-17: 75 mL via INTRAVENOUS

## 2015-05-15 DIAGNOSIS — Z1231 Encounter for screening mammogram for malignant neoplasm of breast: Secondary | ICD-10-CM | POA: Diagnosis not present

## 2015-05-16 DIAGNOSIS — J309 Allergic rhinitis, unspecified: Secondary | ICD-10-CM | POA: Diagnosis not present

## 2015-05-16 DIAGNOSIS — H903 Sensorineural hearing loss, bilateral: Secondary | ICD-10-CM | POA: Diagnosis not present

## 2015-05-16 DIAGNOSIS — H6981 Other specified disorders of Eustachian tube, right ear: Secondary | ICD-10-CM | POA: Diagnosis not present

## 2015-07-14 DIAGNOSIS — M25572 Pain in left ankle and joints of left foot: Secondary | ICD-10-CM | POA: Diagnosis not present

## 2015-07-14 DIAGNOSIS — M25562 Pain in left knee: Secondary | ICD-10-CM | POA: Diagnosis not present

## 2015-07-16 ENCOUNTER — Encounter (HOSPITAL_COMMUNITY): Payer: Self-pay | Admitting: Emergency Medicine

## 2015-07-16 ENCOUNTER — Emergency Department (HOSPITAL_COMMUNITY)
Admission: EM | Admit: 2015-07-16 | Discharge: 2015-07-17 | Disposition: A | Discharge status: Home / Self Care | Payer: Medicare Other | Attending: Emergency Medicine | Admitting: Emergency Medicine

## 2015-07-16 DIAGNOSIS — Z79899 Other long term (current) drug therapy: Secondary | ICD-10-CM | POA: Diagnosis not present

## 2015-07-16 DIAGNOSIS — S0990XA Unspecified injury of head, initial encounter: Secondary | ICD-10-CM | POA: Diagnosis present

## 2015-07-16 DIAGNOSIS — Z880 Allergy status to penicillin: Secondary | ICD-10-CM | POA: Diagnosis not present

## 2015-07-16 DIAGNOSIS — W228XXA Striking against or struck by other objects, initial encounter: Secondary | ICD-10-CM | POA: Insufficient documentation

## 2015-07-16 DIAGNOSIS — Y998 Other external cause status: Secondary | ICD-10-CM | POA: Insufficient documentation

## 2015-07-16 DIAGNOSIS — K219 Gastro-esophageal reflux disease without esophagitis: Secondary | ICD-10-CM | POA: Diagnosis not present

## 2015-07-16 DIAGNOSIS — Y9289 Other specified places as the place of occurrence of the external cause: Secondary | ICD-10-CM | POA: Insufficient documentation

## 2015-07-16 DIAGNOSIS — Y9389 Activity, other specified: Secondary | ICD-10-CM | POA: Diagnosis not present

## 2015-07-16 DIAGNOSIS — Z86010 Personal history of colonic polyps: Secondary | ICD-10-CM | POA: Diagnosis not present

## 2015-07-16 DIAGNOSIS — H9190 Unspecified hearing loss, unspecified ear: Secondary | ICD-10-CM | POA: Diagnosis not present

## 2015-07-16 DIAGNOSIS — S0083XA Contusion of other part of head, initial encounter: Secondary | ICD-10-CM | POA: Diagnosis not present

## 2015-07-16 DIAGNOSIS — S0093XA Contusion of unspecified part of head, initial encounter: Secondary | ICD-10-CM

## 2015-07-16 DIAGNOSIS — M8580 Other specified disorders of bone density and structure, unspecified site: Secondary | ICD-10-CM | POA: Diagnosis not present

## 2015-07-16 DIAGNOSIS — R51 Headache: Secondary | ICD-10-CM | POA: Diagnosis not present

## 2015-07-16 NOTE — ED Notes (Signed)
Patient struck her head on Friday evening and has had an intermittent headache since that time. Patient has not taken any medications to teart this. Patient reports having frequent headaches but that this feels different.

## 2015-07-17 ENCOUNTER — Emergency Department (HOSPITAL_COMMUNITY): Payer: Medicare Other

## 2015-07-17 DIAGNOSIS — S0990XA Unspecified injury of head, initial encounter: Secondary | ICD-10-CM | POA: Diagnosis not present

## 2015-07-17 DIAGNOSIS — S0083XA Contusion of other part of head, initial encounter: Secondary | ICD-10-CM | POA: Diagnosis not present

## 2015-07-17 DIAGNOSIS — R51 Headache: Secondary | ICD-10-CM | POA: Diagnosis not present

## 2015-07-17 NOTE — Discharge Instructions (Signed)
Contusion A contusion is a deep bruise. Contusions are the result of a blunt injury to tissues and muscle fibers under the skin. The injury causes bleeding under the skin. The skin overlying the contusion may turn blue, purple, or yellow. Minor injuries will give you a painless contusion, but more severe contusions may stay painful and swollen for a few weeks.  CAUSES  This condition is usually caused by a blow, trauma, or direct force to an area of the body. SYMPTOMS  Symptoms of this condition include:  Swelling of the injured area.  Pain and tenderness in the injured area.  Discoloration. The area may have redness and then turn blue, purple, or yellow. DIAGNOSIS  This condition is diagnosed based on a physical exam and medical history. An X-ray, CT scan, or MRI may be needed to determine if there are any associated injuries, such as broken bones (fractures). TREATMENT  Specific treatment for this condition depends on what area of the body was injured. In general, the best treatment for a contusion is resting, icing, applying pressure to (compression), and elevating the injured area. This is often called the RICE strategy. Over-the-counter anti-inflammatory medicines may also be recommended for pain control.  HOME CARE INSTRUCTIONS   Rest the injured area.  If directed, apply ice to the injured area:  Put ice in a plastic bag.  Place a towel between your skin and the bag.  Leave the ice on for 20 minutes, 2-3 times per day.  If directed, apply light compression to the injured area using an elastic bandage. Make sure the bandage is not wrapped too tightly. Remove and reapply the bandage as directed by your health care provider.  If possible, raise (elevate) the injured area above the level of your heart while you are sitting or lying down.  Take over-the-counter and prescription medicines only as told by your health care provider. SEEK MEDICAL CARE IF:  Your symptoms do not  improve after several days of treatment.  Your symptoms get worse.  You have difficulty moving the injured area. SEEK IMMEDIATE MEDICAL CARE IF:   You have severe pain.  You have numbness in a hand or foot.  Your hand or foot turns pale or cold.   This information is not intended to replace advice given to you by your health care provider. Make sure you discuss any questions you have with your health care provider.   Document Released: 01/30/2005 Document Revised: 01/11/2015 Document Reviewed: 09/07/2014 Elsevier Interactive Patient Education 2016 [place] Injury, Adult You have received a head injury. It does not appear serious at this time. Headaches and vomiting are common following head injury. It should be easy to awaken from sleeping. Sometimes it is necessary for you to stay in the emergency department for a while for observation. Sometimes admission to the hospital may be needed. After injuries such as yours, most problems occur within the first 24 hours, but side effects may occur up to 7-10 days after the injury. It is important for you to carefully monitor your condition and contact your health care provider or seek immediate medical care if there is a change in your condition. WHAT ARE THE TYPES OF HEAD INJURIES? Head injuries can be as minor as a bump. Some head injuries can be more severe. More severe head injuries include:  A jarring injury to the brain (concussion).  A bruise of the brain (contusion). This mean there is bleeding in the brain that can cause swelling.  A cracked skull (skull fracture).  Bleeding in the brain that collects, clots, and forms a bump (hematoma). WHAT CAUSES A HEAD INJURY? A serious head injury is most likely to happen to someone who is in a car wreck and is not wearing a seat belt. Other causes of major head injuries include bicycle or motorcycle accidents, sports injuries, and falls. HOW ARE HEAD INJURIES DIAGNOSED? A complete  history of the event leading to the injury and your current symptoms will be helpful in diagnosing head injuries. Many times, pictures of the brain, such as CT or MRI are needed to see the extent of the injury. Often, an overnight hospital stay is necessary for observation.  WHEN SHOULD I SEEK IMMEDIATE MEDICAL CARE?  You should get help right away if:  You have confusion or drowsiness.  You feel sick to your stomach (nauseous) or have continued, forceful vomiting.  You have dizziness or unsteadiness that is getting worse.  You have severe, continued headaches not relieved by medicine. Only take over-the-counter or prescription medicines for pain, fever, or discomfort as directed by your health care provider.  You do not have normal function of the arms or legs or are unable to walk.  You notice changes in the black spots in the center of the colored part of your eye (pupil).  You have a clear or bloody fluid coming from your nose or ears.  You have a loss of vision. During the next 24 hours after the injury, you must stay with someone who can watch you for the warning signs. This person should contact local emergency services (911 in the [place] if you have seizures, you become unconscious, or you are unable to wake up. HOW CAN I PREVENT A HEAD INJURY IN THE FUTURE? The most important factor for preventing major head injuries is avoiding motor vehicle accidents. To minimize the potential for damage to your head, it is crucial to wear seat belts while riding in motor vehicles. Wearing helmets while bike riding and playing collision sports (like football) is also helpful. Also, avoiding dangerous activities around the house will further help reduce your risk of head injury.  WHEN CAN I RETURN TO NORMAL ACTIVITIES AND ATHLETICS? You should be reevaluated by your health care provider before returning to these activities. If you have any of the following symptoms, you should not return to  activities or contact sports until 1 week after the symptoms have stopped:  Persistent headache.  Dizziness or vertigo.  Poor attention and concentration.  Confusion.  Memory problems.  Nausea or vomiting.  Fatigue or tire easily.  Irritability.  Intolerant of bright lights or loud noises.  Anxiety or depression.  Disturbed sleep. MAKE SURE YOU:   Understand these instructions.  Will watch your condition.  Will get help right away if you are not doing well or get worse.   This information is not intended to replace advice given to you by your health care provider. Make sure you discuss any questions you have with your health care provider.   Document Released: 04/22/2005 Document Revised: 05/13/2014 Document Reviewed: 12/28/2012 Elsevier Interactive Patient Education [employer].

## 2015-07-17 NOTE — ED Provider Notes (Signed)
CSN: [id number]     Arrival date & time 07/16/15  2207 History   First MD Initiated Contact with Patient 07/17/15 0031     Chief Complaint  Patient presents with  . Headache    posterior after hitting it Friday evening     (Consider location/radiation/quality/duration/timing/severity/associated sxs/prior Treatment) Patient is a 70 y.o. female presenting with headaches. The history is provided by the patient. No language interpreter was used.  Headache Pain location:  Generalized Quality:  Sharp Radiates to:  Does not radiate Severity currently:  8/10 Severity at highest:  8/10 Onset quality:  Gradual Duration:  2 days Timing:  Constant Progression:  Worsening Chronicity:  New Context: not activity   Relieved by:  Nothing Worsened by:  Nothing Ineffective treatments:  None tried Associated symptoms: no blurred vision and no congestion   Risk factors: no anger   Pt reports she hit her head 2 days ago.  Pt reports increasing headache since Pt reports she hit her head over a metal bed frame.   Past Medical History  Diagnosis Date  . Allergic rhinitis   . GERD (gastroesophageal reflux disease)   . Menopausal disorder   . Insomnia   . Hemorrhoids   . Bronchiectasis   . Hearing loss   . TMJ syndrome   . Palpitations   . Urticaria   . Chronic sinusitis   . Osteoporosis   . Diverticulosis   . Adenomatous colon polyp 10/2003  . Allergy     SEASONAL   Past Surgical History  Procedure Laterality Date  . Tympanostomy    . Nasal sinus surgery    . Colonoscopy     Family History  Problem Relation Age of Onset  . Rectal cancer Mother     died age 20  . Seizures Mother   . Colon cancer Mother 70  . Cancer Mother   . Heart disease Father     dies age 28  . Hypertension Father   . Heart disease Maternal Grandfather   . Cancer Paternal Uncle     multiple uncles with cancer  . Cancer Maternal Grandmother    Social History  Substance Use Topics  . Smoking status:  Never Smoker   . Smokeless tobacco: Never Used  . Alcohol Use: 1.2 oz/week    2 Glasses of wine per week   OB History    No data available     Review of Systems  HENT: Negative for congestion.   Eyes: Negative for blurred vision.  Neurological: Positive for headaches.  All other systems reviewed and are negative.     Allergies  Amoxicillin-pot clavulanate; Avelox; Bactrim; Benzonatate; Ciprofloxacin; and Sulfonamide derivatives  Home Medications   Prior to Admission medications   Medication Sig Start Date End Date Taking? Authorizing Provider  acetaminophen (TYLENOL) 500 MG tablet Take 500 mg by mouth every 6 (six) hours as needed for moderate pain or headache.   Yes Historical Provider, MD  calcium carbonate (OSCAL) 1500 (600 Ca) MG TABS tablet Take 600 mg of elemental calcium by mouth daily.   Yes Historical Provider, MD  calcium carbonate (TUMS) 500 MG chewable tablet Chew 1 tablet (200 mg of elemental calcium total) by mouth daily. 04/13/14  Yes Aleksei Plotnikov V, MD  cetirizine (ZYRTEC) 10 MG tablet Take 5 mg by mouth daily as needed for allergies.   Yes Historical Provider, MD  Cholecalciferol (VITAMIN D) 1000 UNITS capsule Take 1,000 Units by mouth daily.     Yes Historical  Provider, MD  Estradiol 10 MCG TABS Place 1 tablet vaginally 2 (two) times a week.    Yes Historical Provider, MD  fluticasone (FLONASE) 50 MCG/ACT nasal spray Place 2 sprays into the nose as needed for allergies or rhinitis.  03/05/11  Yes Elsie Stain, MD  Lactobacillus Rhamnosus, GG, (CULTURELLE PO) Take 1 tablet by mouth daily.   Yes Historical Provider, MD  Multiple Vitamins-Minerals (CENTRUM SILVER PO) Take 1 tablet by mouth 3 (three) times a week. Take 1 tablet by mouth twice a week   Yes Historical Provider, MD  omeprazole (PRILOSEC) 20 MG capsule Take 20 mg by mouth daily as needed (acid reflux).    Yes Historical Provider, MD  promethazine (PHENERGAN) 12.5 MG tablet Take 1 tablet (12.5 mg  total) by mouth every 6 (six) hours as needed for nausea or vomiting. Patient not taking: Reported on 07/17/2015 03/22/15   Michel Bickers, MD   BP 150/84 mmHg  Pulse 82  Temp(Src) 97.7 F (36.5 C) (Oral)  Resp 20  SpO2 100% Physical Exam  Constitutional: She is oriented to person, place, and time. She appears well-developed and well-nourished.  HENT:  Head: Normocephalic and atraumatic.  Right Ear: External ear normal.  Left Ear: External ear normal.  Nose: Nose normal.  Mouth/Throat: Oropharynx is clear and moist.  Eyes: Conjunctivae and EOM are normal. Pupils are equal, round, and reactive to light.  Neck: Normal range of motion.  Cardiovascular: Normal rate and normal heart sounds.   Pulmonary/Chest: Effort normal and breath sounds normal.  Abdominal: She exhibits no distension.  Musculoskeletal: Normal range of motion.  Neurological: She is alert and oriented to person, place, and time.  Skin: Skin is warm.  Psychiatric: She has a normal mood and affect.  Nursing note and vitals reviewed.   ED Course  Procedures (including critical care time) Labs Review Labs Reviewed - No data to display  Imaging Review Ct Head Wo Contrast  07/17/2015  CLINICAL DATA:  Fall with head injury.  Headache EXAM: CT HEAD WITHOUT CONTRAST TECHNIQUE: Contiguous axial images were obtained from the base of the skull through the vertex without intravenous contrast. COMPARISON:  08/14/2012 FINDINGS: Initial encounter. Skull and Sinuses:Negative for fracture or destructive process. Previous endoscopic sinus surgery. No sinus opacification currently. Scalp calcification in the right parietal region compatible with dermal inclusion cyst. Visualized orbits: Negative. Brain: Normal for age. No evidence of acute infarction, hemorrhage, hydrocephalus, or mass lesion/mass effect. IMPRESSION: Negative for intracranial injury or fracture. Electronically Signed   By: Monte Fantasia M.D.   On: 07/17/2015 02:04   I  have personally reviewed and evaluated these images and lab results as part of my medical decision-making.   EKG Interpretation None      MDM Ct scan normal.   Pt advised to follow up with her MD for recheck.  Return if any problems.     Final diagnoses:  Contusion of head, initial encounter    An After Visit Summary was printed and given to the patient.    [place], PA-C 07/17/15 0222  Virgel Manifold, MD 07/20/15 [id number]

## 2015-07-19 DIAGNOSIS — M25572 Pain in left ankle and joints of left foot: Secondary | ICD-10-CM | POA: Diagnosis not present

## 2015-07-19 DIAGNOSIS — S93402D Sprain of unspecified ligament of left ankle, subsequent encounter: Secondary | ICD-10-CM | POA: Diagnosis not present

## 2015-07-19 DIAGNOSIS — M7662 Achilles tendinitis, left leg: Secondary | ICD-10-CM | POA: Diagnosis not present

## 2015-07-19 DIAGNOSIS — M6281 Muscle weakness (generalized): Secondary | ICD-10-CM | POA: Diagnosis not present

## 2015-09-07 ENCOUNTER — Encounter: Payer: Self-pay | Admitting: Emergency Medicine

## 2015-09-07 ENCOUNTER — Ambulatory Visit (INDEPENDENT_AMBULATORY_CARE_PROVIDER_SITE_OTHER): Payer: Medicare Other | Admitting: Emergency Medicine

## 2015-09-07 VITALS — BP 108/60 | HR 96 | Ht 62.0 in | Wt 103.0 lb

## 2015-09-07 DIAGNOSIS — J301 Allergic rhinitis due to pollen: Secondary | ICD-10-CM | POA: Diagnosis not present

## 2015-09-07 DIAGNOSIS — J479 Bronchiectasis, uncomplicated: Secondary | ICD-10-CM

## 2015-09-07 DIAGNOSIS — J329 Chronic sinusitis, unspecified: Secondary | ICD-10-CM

## 2015-09-07 NOTE — Assessment & Plan Note (Signed)
On a good regimen of nasal saline, Zyrtec and fluticasone nasal spray during the allergy season.

## 2015-09-07 NOTE — Assessment & Plan Note (Signed)
History of bronchiectasis with prior negative bronchoscopy in 2003. Last CT scan in 2011 showed a right middle lobe involvement. She is currently asymptomatic. She has flares about every 2 years. At this point I believe we can continue her maintenance allergy regimen. We do not need to add any bronchodilators. I would like to repeat pulmonary function testing and a CT scan of her chest to establish her current baseline.

## 2015-09-07 NOTE — Progress Notes (Signed)
Subjective:    Patient ID: Monica [name], female    DOB: [birth year], 70 y.o.   MRN: [id number]  HPI [name] is a 70 year old never smoker with a history of allergic rhinitis, GERD,urticaria. She has been followed in our office by Dr. Joya Gaskins for bronchiectasis. Her last CT scan of the chest was performed in 2011 which a person reviewed. This shows probably right middle lobe bronchiectatic changes. There are some micronodular disease could be consistent with atypical mycobacterial infection although this is never been documented. She was originally dx in setting recurrent bronchitis / PNA's. Had a bronchoscopy in 2003 - cx's negative. She has flared occasionally, last time was 2 yrs ago. She has been on scheduled BD's before, none currently, none for several years.   She does have more chronic sinus disease - does NSW reliably, flonase during allergy season, zyrtec during allergy season. GERD on prilosec, zantac prn.  Her ENT is Dr Thornell Mule, remote sinus sgy. She is treated with abx fairly frequently.   Denies any current cough, dyspnea. She is active, exercises.    Review of Systems As per history of present illness  Past Medical History  Diagnosis Date  . Allergic rhinitis   . GERD (gastroesophageal reflux disease)   . Menopausal disorder   . Insomnia   . Hemorrhoids   . Bronchiectasis   . Hearing loss   . TMJ syndrome   . Palpitations   . Urticaria   . Chronic sinusitis   . Osteoporosis   . Diverticulosis   . Adenomatous colon polyp 10/2003  . Allergy     SEASONAL     Family History  Problem Relation Age of Onset  . Rectal cancer Mother     died age 20  . Seizures Mother   . Colon cancer Mother 51  . Cancer Mother   . Heart disease Father     dies age 71  . Hypertension Father   . Heart disease Maternal Grandfather   . Cancer Paternal Uncle     multiple uncles with cancer  . Cancer Maternal Grandmother      Social History   Social History  . Marital Status:  Married    Spouse Name: [name]  . Number of Children: 0  . Years of Education: N/A   Occupational History  . retired Therapist, sports    Social History Main Topics  . Smoking status: Never Smoker   . Smokeless tobacco: Never Used  . Alcohol Use: 1.2 oz/week    2 Glasses of wine per week  . Drug Use: No  . Sexual Activity: Not on file   Other Topics Concern  . 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   Allergies  Allergen Reactions  . Amoxicillin-Pot Clavulanate Diarrhea    Has patient had a PCN reaction causing immediate rash, facial/tongue/throat swelling, SOB or lightheadedness with hypotension:No Has patient had a PCN reaction causing severe rash involving mucus membranes or skin necrosis:No Has patient had a PCN reaction that required hospitalization:No Has patient had a PCN reaction occurring within the last 10 years:Yes If all of the above answers are "NO", then may proceed with Cephalosporin use  . Avelox [Moxifloxacin Hcl In Nacl] Nausea Only  . Bactrim [Sulfamethoxazole-Trimethoprim] Other (See Comments)    flushing  . Benzonatate Other (See Comments)    felt bad, out of  sorts, disoriented.  . Ciprofloxacin Rash  . Sulfonamide Derivatives Palpitations and Other (See Comments)    Felt flushed     Outpatient Prescriptions Prior to Visit  Medication Sig Dispense Refill  . acetaminophen (TYLENOL) 500 MG tablet Take 500 mg by mouth every 6 (six) hours as needed for moderate pain or headache.    . calcium carbonate (OSCAL) 1500 (600 Ca) MG TABS tablet Take 600 mg of elemental calcium by mouth daily.    . calcium carbonate (TUMS) 500 MG chewable tablet Chew 1 tablet (200 mg of elemental calcium total) by mouth daily. 100 tablet 3  . cetirizine (ZYRTEC) 10 MG tablet Take 5 mg by mouth daily as needed for allergies.    . Cholecalciferol (VITAMIN D) 1000 UNITS capsule Take  1,000 Units by mouth daily.      . Estradiol 10 MCG TABS Place 1 tablet vaginally 2 (two) times a week.     . fluticasone (FLONASE) 50 MCG/ACT nasal spray Place 2 sprays into the nose as needed for allergies or rhinitis.     . Lactobacillus Rhamnosus, GG, (CULTURELLE PO) Take 1 tablet by mouth daily.    . Multiple Vitamins-Minerals (CENTRUM SILVER PO) Take 1 tablet by mouth 3 (three) times a week. Take 1 tablet by mouth twice a week    . omeprazole (PRILOSEC) 20 MG capsule Take 20 mg by mouth daily as needed (acid reflux).     . promethazine (PHENERGAN) 12.5 MG tablet Take 1 tablet (12.5 mg total) by mouth every 6 (six) hours as needed for nausea or vomiting. (Patient not taking: Reported on 07/17/2015) 30 tablet 0   No facility-administered medications prior to visit.         Objective:   Physical Exam Filed Vitals:   09/07/15 0858  BP: 108/60  Pulse: 96  Height: 5\' 2"  (1.575 m)  Weight: 103 lb (46.72 kg)  SpO2: 99%   'Gen: Pleasant, thin woman, well-appearing, in no distress,  normal affect  ENT: No lesions,  mouth clear,  oropharynx clear, no postnasal drip  Neck: No JVD, no TMG, no carotid bruits  Lungs: No use of accessory muscles, clear without rales or rhonchi, no wheeze on a forced expiration.   Cardiovascular: RRR, heart sounds normal, no murmur or gallops, no peripheral edema  Musculoskeletal: No deformities, no cyanosis or clubbing  Neuro: alert, non focal  Skin: Warm, no lesions or rashes    11/02/09 --  Comparison: Chest x-ray 10/31/2009 and CT abdomen 06/28/2008.  Findings: A small right thyroid nodule is nonspecific. Biapical pleural parenchymal scarring, with subpleural extension. There is peribronchovascular nodularity and bronchiectasis in the right middle lobe and right lower lobe. Peribronchovascular ground-glass is seen in the lingula and left lower lobe. That seen in the inferior left lower lobe (image 83), appears worse than on 06/28/2008,  and is associated with mucoid impaction. No pleural fluid. Airway is otherwise unremarkable. Inspiratory and expiratory imaging shows no evidence of air trapping.  Incidental imaging of the upper abdomen shows a sub centimeter low attenuation lesion in the left hepatic lobe, stable. No worrisome lytic or sclerotic lesions. There are scattered pulmonary nodules, measuring up to 8 mm in the subpleural right lower lobe. Some of these are seen on 06/28/2008.  IMPRESSION:  Scattered areas of nodularity, peribronchovascular nodularity, peribronchovascular ground-glass, bronchiectasis and mucoid impaction. These findings can be seen with a chronic infectious process, such as Mycobacterium avium complex (MAC).       Assessment & Plan:  Bronchiectasis without acute exacerbation History of bronchiectasis with prior negative bronchoscopy in 2003. Last CT scan in 2011 showed a right middle lobe involvement. She is currently asymptomatic. She has flares about every 2 years. At this point I believe we can continue her maintenance allergy regimen. We do not need to add any bronchodilators. I would like to repeat pulmonary function testing and a CT scan of her chest to establish her current baseline.  ALLERGIC RHINITIS, SEASONAL On a good regimen of nasal saline, Zyrtec and fluticasone nasal spray during the allergy season.  Sinusitis, chronic Frequent episodes of sinusitis. She's had surgery wants. She has required prolonged antibiotic treatment on multiple occasions, last was about one year ago. We will continue her allergy regimen. Her GERD is well controlled and not sure that it's a significant contributor to cough or this problem. Will follow as we go forward to see if we believe it's contributor to cough if she flares.

## 2015-09-07 NOTE — Patient Instructions (Addendum)
We will perform a CT chest without contrast this Summer to compare with 2011.  We will perform full pulmonary function testing  Please continue your zyrtec and flonase during allergy season Continue saline nasal rinses as you are doing them  Follow with Dr Lamonte Sakai in 6 months or sooner if you have any problems

## 2015-09-07 NOTE — Assessment & Plan Note (Signed)
Frequent episodes of sinusitis. She's had surgery wants. She has required prolonged antibiotic treatment on multiple occasions, last was about one year ago. We will continue her allergy regimen. Her GERD is well controlled and not sure that it's a significant contributor to cough or this problem. Will follow as we go forward to see if we believe it's contributor to cough if she flares.

## 2015-09-11 ENCOUNTER — Encounter: Payer: Self-pay | Admitting: Gastroenterology

## 2015-09-17 ENCOUNTER — Emergency Department (HOSPITAL_COMMUNITY)
Admission: EM | Admit: 2015-09-17 | Discharge: 2015-09-17 | Disposition: A | Discharge status: Home / Self Care | Payer: Medicare Other | Attending: Emergency Medicine | Admitting: Emergency Medicine

## 2015-09-17 ENCOUNTER — Encounter (HOSPITAL_COMMUNITY): Payer: Self-pay | Admitting: Emergency Medicine

## 2015-09-17 ENCOUNTER — Emergency Department (HOSPITAL_COMMUNITY): Payer: Medicare Other

## 2015-09-17 ENCOUNTER — Other Ambulatory Visit: Payer: Self-pay

## 2015-09-17 DIAGNOSIS — H9190 Unspecified hearing loss, unspecified ear: Secondary | ICD-10-CM | POA: Insufficient documentation

## 2015-09-17 DIAGNOSIS — K219 Gastro-esophageal reflux disease without esophagitis: Secondary | ICD-10-CM | POA: Insufficient documentation

## 2015-09-17 DIAGNOSIS — I480 Paroxysmal atrial fibrillation: Secondary | ICD-10-CM | POA: Insufficient documentation

## 2015-09-17 DIAGNOSIS — Z86010 Personal history of colonic polyps: Secondary | ICD-10-CM | POA: Diagnosis not present

## 2015-09-17 DIAGNOSIS — R000 Tachycardia, unspecified: Secondary | ICD-10-CM | POA: Diagnosis not present

## 2015-09-17 DIAGNOSIS — M810 Age-related osteoporosis without current pathological fracture: Secondary | ICD-10-CM | POA: Diagnosis not present

## 2015-09-17 DIAGNOSIS — R002 Palpitations: Secondary | ICD-10-CM | POA: Diagnosis present

## 2015-09-17 DIAGNOSIS — Z8742 Personal history of other diseases of the female genital tract: Secondary | ICD-10-CM | POA: Diagnosis not present

## 2015-09-17 DIAGNOSIS — Z880 Allergy status to penicillin: Secondary | ICD-10-CM | POA: Diagnosis not present

## 2015-09-17 DIAGNOSIS — Z872 Personal history of diseases of the skin and subcutaneous tissue: Secondary | ICD-10-CM | POA: Insufficient documentation

## 2015-09-17 DIAGNOSIS — R110 Nausea: Secondary | ICD-10-CM | POA: Insufficient documentation

## 2015-09-17 DIAGNOSIS — Z8709 Personal history of other diseases of the respiratory system: Secondary | ICD-10-CM | POA: Insufficient documentation

## 2015-09-17 DIAGNOSIS — Z79899 Other long term (current) drug therapy: Secondary | ICD-10-CM | POA: Diagnosis not present

## 2015-09-17 LAB — BASIC METABOLIC PANEL
Anion gap: 11 (ref 5–15)
BUN: 12 mg/dL (ref 6–20)
CALCIUM: 9.2 mg/dL (ref 8.9–10.3)
CO2: 24 mmol/L (ref 22–32)
CREATININE: 0.86 mg/dL (ref 0.44–1.00)
Chloride: 101 mmol/L (ref 101–111)
GFR calc non Af Amer: >60 mL/min (ref >60)
GLUCOSE: 121 mg/dL — AB (ref 65–99)
Potassium: 3.6 mmol/L (ref 3.5–5.1)
Sodium: 136 mmol/L (ref 135–145)

## 2015-09-17 LAB — CBC WITH DIFFERENTIAL/PLATELET
BASOS PCT: 1 %
Basophils Absolute: 0 10*3/uL (ref 0.0–0.1)
Eosinophils Absolute: 0.1 10*3/uL (ref 0.0–0.7)
Eosinophils Relative: 1 %
HCT: 39.1 % (ref 36.0–46.0)
Hemoglobin: 13.5 g/dL (ref 12.0–15.0)
Lymphocytes Relative: 22 %
Lymphs Abs: 1.9 10*3/uL (ref 0.7–4.0)
MCH: 30.8 pg (ref 26.0–34.0)
MCHC: 34.5 g/dL (ref 30.0–36.0)
MCV: 89.3 fL (ref 78.0–100.0)
MONO ABS: 0.7 10*3/uL (ref 0.1–1.0)
MONOS PCT: 8 %
Neutro Abs: 5.9 10*3/uL (ref 1.7–7.7)
Neutrophils Relative %: 68 %
Platelets: 302 10*3/uL (ref 150–400)
RBC: 4.38 MIL/uL (ref 3.87–5.11)
RDW: 12.2 % (ref 11.5–15.5)
WBC: 8.6 10*3/uL (ref 4.0–10.5)

## 2015-09-17 LAB — MAGNESIUM: Magnesium: 2.1 mg/dL (ref 1.7–2.4)

## 2015-09-17 LAB — TSH: TSH: 4.381 u[IU]/mL (ref 0.350–4.500)

## 2015-09-17 MED ORDER — ASPIRIN EC 325 MG PO TBEC: 325.0000 mg | DELAYED_RELEASE_TABLET | Freq: Every day | ORAL | Status: DC

## 2015-09-17 NOTE — ED Provider Notes (Signed)
CSN: [id number]     Arrival date & time 09/17/15  2006 History   First MD Initiated Contact with Patient 09/17/15 2007     Chief Complaint  Patient presents with  . Palpitations    new afib     (Consider location/radiation/quality/duration/timing/severity/associated sxs/prior Treatment) HPI Patient is a 70 year old female with past medical history of bronchiectasis, GERD and benign PVCs he presents after an episode of tachycardia. She reports onset of tachycardia around 5 PM she describes as her heart beat very fast and very hard. She attempted vagal maneuvers without relief. She called EMS because she is concerned she is in atrial fibrillation. She has never been atrial fibrillation before but she is a Marine scientist. Report reports she has been taking one half of a Zyrtec tablet for the past month for allergy symptoms. No other over-the-counter cold medicines. She had a cup and a half of tea and a chocolate cookie today but this is not abnormal for her. She may be slightly dehydrated from working outside earlier today but did drink some Gatorade earlier. She has not had recent fever, cough or other infectious symptoms. She otherwise been feeling well. Rhythm strips obtained by EMS prior to arrival show paroxysomal afib/aflutter.  Past Medical History  Diagnosis Date  . Allergic rhinitis   . GERD (gastroesophageal reflux disease)   . Menopausal disorder   . Insomnia   . Hemorrhoids   . Bronchiectasis   . Hearing loss   . TMJ syndrome   . Palpitations   . Urticaria   . Chronic sinusitis   . Osteoporosis   . Diverticulosis   . Adenomatous colon polyp 10/2003  . Allergy     SEASONAL   Past Surgical History  Procedure Laterality Date  . Tympanostomy    . Nasal sinus surgery    . Colonoscopy     Family History  Problem Relation Age of Onset  . Rectal cancer Mother     died age 54  . Seizures Mother   . Colon cancer Mother 77  . Cancer Mother   . Heart disease Father     dies age 90   . Hypertension Father   . Heart disease Maternal Grandfather   . Cancer Paternal Uncle     multiple uncles with cancer  . Cancer Maternal Grandmother    Social History  Substance Use Topics  . Smoking status: Never Smoker   . Smokeless tobacco: Never Used  . Alcohol Use: 1.2 oz/week    2 Glasses of wine per week   OB History    No data available     Review of Systems  Constitutional: Negative for fever and chills.  HENT: Negative for congestion and rhinorrhea.   Eyes: Negative for visual disturbance.  Respiratory: Negative for cough and shortness of breath.   Cardiovascular: Positive for palpitations. Negative for chest pain and leg swelling.  Gastrointestinal: Positive for nausea. Negative for constipation.  Endocrine: Negative for cold intolerance and heat intolerance.  Genitourinary: Negative for difficulty urinating and dyspareunia.  Musculoskeletal: Negative for back pain and neck pain.  Skin: Negative for pallor and rash.  Neurological: Negative for dizziness and headaches.  Psychiatric/Behavioral: Negative for confusion.  All other systems reviewed and are negative.     Allergies  Amoxicillin-pot clavulanate; Avelox; Bactrim; Benzonatate; Ciprofloxacin; and Sulfonamide derivatives  Home Medications   Prior to Admission medications   Medication Sig Start Date End Date Taking? Authorizing Provider  acetaminophen (TYLENOL) 500 MG tablet Take 500 mg  by mouth every 6 (six) hours as needed for moderate pain or headache.    Historical Provider, MD  calcium carbonate (OSCAL) 1500 (600 Ca) MG TABS tablet Take 600 mg of elemental calcium by mouth daily.    Historical Provider, MD  calcium carbonate (TUMS) 500 MG chewable tablet Chew 1 tablet (200 mg of elemental calcium total) by mouth daily. 04/13/14   Aleksei Plotnikov V, MD  cetirizine (ZYRTEC) 10 MG tablet Take 5 mg by mouth daily as needed for allergies.    Historical Provider, MD  Cholecalciferol (VITAMIN D) 1000  UNITS capsule Take 1,000 Units by mouth daily.      Historical Provider, MD  Estradiol 10 MCG TABS Place 1 tablet vaginally 2 (two) times a week.     Historical Provider, MD  fluticasone (FLONASE) 50 MCG/ACT nasal spray Place 2 sprays into the nose as needed for allergies or rhinitis.  03/05/11   Elsie Stain, MD  Lactobacillus Rhamnosus, GG, (CULTURELLE PO) Take 1 tablet by mouth daily.    Historical Provider, MD  Multiple Vitamins-Minerals (CENTRUM SILVER PO) Take 1 tablet by mouth 3 (three) times a week. Take 1 tablet by mouth twice a week    Historical Provider, MD  omeprazole (PRILOSEC) 20 MG capsule Take 20 mg by mouth daily as needed (acid reflux).     Historical Provider, MD  ranitidine (ZANTAC) 150 MG tablet Take 150 mg by mouth daily as needed for heartburn.    Historical Provider, MD   BP 154/70 mmHg  Pulse 98  Temp(Src) 97.8 F (36.6 C) (Oral)  Resp 15  Ht 5\' 2"  (1.575 m)  Wt 47.174 kg  BMI 19.02 kg/m2  SpO2 100% Physical Exam  Constitutional: She is oriented to person, place, and time. She appears well-developed and well-nourished. No distress.  HENT:  Head: Normocephalic and atraumatic.  Eyes: EOM are normal. Pupils are equal, round, and reactive to light.  Neck: Normal range of motion. Neck supple.  Cardiovascular: Normal rate, regular rhythm, normal heart sounds and intact distal pulses.   No murmur heard. Pulmonary/Chest: Effort normal and breath sounds normal. No respiratory distress. She has no wheezes. She has no rales.  Abdominal: Soft. She exhibits no distension. There is no tenderness.  Musculoskeletal: Normal range of motion. She exhibits no edema or tenderness.  Neurological: She is alert and oriented to person, place, and time.  Skin: Skin is warm and dry. No rash noted.  Psychiatric: She has a normal mood and affect.  Nursing note and vitals reviewed.   ED Course  Procedures (including critical care time) Labs Review Labs Reviewed  BASIC METABOLIC  PANEL - Abnormal; Notable for the following:    Glucose, Bld 121 (*)    All other components within normal limits  TSH  CBC WITH DIFFERENTIAL/PLATELET  MAGNESIUM    Imaging Review Dg Chest 2 View  09/17/2015  CLINICAL DATA:  Initial evaluation for acute tachycardia. EXAM: CHEST  2 VIEW COMPARISON:  Prior study from 10/23/2009. FINDINGS: Cardiac mediastinal silhouettes are stable in size and contour, and remain within normal limits. Lungs are normally inflated. Scattered pleural-parenchymal scarring at the lung apices, likely unchanged. No consolidative airspace disease. No pulmonary edema or pleural effusion. No pneumothorax. No acute osseus abnormality. IMPRESSION: 1. No active cardiopulmonary disease. 2. Bilateral pleural parenchymal scarring at the lung apices, similar to prior. Electronically Signed   By: Jeannine Boga M.D.   On: 09/17/2015 22:07   I have personally reviewed and evaluated these images  and lab results as part of my medical decision-making.   EKG Interpretation   Date/Time:  Sunday Sep 17 2015 20:08:15 EDT Ventricular Rate:  92 PR Interval:  188 QRS Duration: 72 QT Interval:  350 QTC Calculation: 432 R Axis:   57 Text Interpretation:  Normal sinus rhythm Normal ECG Confirmed by ZAVITZ  MD, JOSHUA [id number]) on 09/17/2015 8:34:22 PM      MDM   Final diagnoses:  PAF (paroxysmal atrial fibrillation) ([place])    On presentation patient had spontaneously converted to sinus rhythm with rates in the 80s to 90s. She is completely asymptomatic. Her exam is benign. CBC, BMP, TSH and magnesium are all within normal limits. Chest x-ray does not show any acute cardiopulmonary abnormalities. This patients CHA2DS2-VASc Score and unadjusted Ischemic Stroke Rate (% per year) is equal to 2.2 % stroke rate/year from a score of 2. Patient receives points only for age and gender.  Above score calculated as 1 point each if present [CHF, HTN, DM, Vascular=MI/PAD/Aortic Plaque, Age if  65-74, or Female] Above score calculated as 2 points each if present [Age > 75, or Stroke/TIA/TE]  Patient was observed on a cardiac monitor and did not have any further episodes of atrial fibrillation or tachycardia during her ED stay. 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 Defers for any other anticoagulants at this time. Patient was referred to the edition for this and advised to call on Monday to schedule follow-up in the next 3 days. She will also follow up with her primary care doctor for reevaluation. Strict return precautions were discussed and patient will return for chest pain, shortness of breath, worsening palpitations or other new concerning symptoms. Patient is in agreement with this plan.  Discussed with Dr. Reather Converse, ED attending      Gibson Ramp, MD 09/18/15 [place], MD 09/18/15 [phone]

## 2015-09-17 NOTE — Discharge Instructions (Signed)
Follow up at atrial fibrillation clinic to discuss anticoagulation and rate control medications. In the meantime take aspirin daily.  If you experience recurrence of your tachycardia, chest pain, shortness of breath or other new concerning symptoms return to the emergency department for evaluation  Atrial Fibrillation Atrial fibrillation is a type of irregular or rapid heartbeat (arrhythmia). In atrial fibrillation, the heart quivers continuously in a chaotic pattern. This occurs when parts of the heart receive disorganized signals that make the heart unable to pump blood normally. This can increase the risk for stroke, heart failure, and other heart-related conditions. There are different types of atrial fibrillation, including:  Paroxysmal atrial fibrillation. This type starts suddenly, and it usually stops on its own shortly after it starts.  Persistent atrial fibrillation. This type often lasts longer than a week. It may stop on its own or with treatment.  Long-lasting persistent atrial fibrillation. This type lasts longer than 12 months.  Permanent atrial fibrillation. This type does not go away. Talk with your health care provider to learn about the type of atrial fibrillation that you have. CAUSES This condition is caused by some heart-related conditions or procedures, including:  A heart attack.  Coronary artery disease.  Heart failure.  Heart valve conditions.  High blood pressure.  Inflammation of the sac that surrounds the heart (pericarditis).  Heart surgery.  Certain heart rhythm disorders, such as Wolf-Parkinson-White syndrome. Other causes include:  Pneumonia.  Obstructive sleep apnea.  Blockage of an artery in the lungs (pulmonary embolism, or PE).  Lung cancer.  Chronic lung disease.  Thyroid problems, especially if the thyroid is overactive (hyperthyroidism).  Caffeine.  Excessive alcohol use or illegal drug use.  Use of some medicines, including  certain decongestants and diet pills. Sometimes, the cause cannot be found. RISK FACTORS This condition is more likely to develop in:  People who are older in age.  People who smoke.  People who have diabetes mellitus.  People who are overweight (obese).  Athletes who exercise vigorously. SYMPTOMS Symptoms of this condition include:  A feeling that your heart is beating rapidly or irregularly.  A feeling of discomfort or pain in your chest.  Shortness of breath.  Sudden light-headedness or weakness.  Getting tired easily during exercise. In some cases, there are no symptoms. DIAGNOSIS Your health care provider may be able to detect atrial fibrillation when taking your pulse. If detected, this condition may be diagnosed with:  An electrocardiogram (ECG).  A Holter monitor test that records your heartbeat patterns over a 24-hour period.  Transthoracic echocardiogram (TTE) to evaluate how blood flows through your heart.  Transesophageal echocardiogram (TEE) to view more detailed images of your heart.  A stress test.  Imaging tests, such as a CT scan or chest X-ray.  Blood tests. TREATMENT The main goals of treatment are to prevent blood clots from forming and to keep your heart beating at a normal rate and rhythm. The type of treatment that you receive depends on many factors, such as your underlying medical conditions and how you feel when you are experiencing atrial fibrillation. This condition may be treated with:  Medicine to slow down the heart rate, bring the heart's rhythm back to normal, or prevent clots from forming.  Electrical cardioversion. This is a procedure that resets your heart's rhythm by delivering a controlled, low-energy shock to the heart through your skin.  Different types of ablation, such as catheter ablation, catheter ablation with pacemaker, or surgical ablation. These procedures destroy the  heart tissues that send abnormal signals. When the  pacemaker is used, it is placed under your skin to help your heart beat in a regular rhythm. HOME CARE INSTRUCTIONS  Take over-the counter and prescription medicines only as told by your health care provider.  If your health care provider prescribed a blood-thinning medicine (anticoagulant), take it exactly as told. Taking too much blood-thinning medicine can cause bleeding. If you do not take enough blood-thinning medicine, you will not have the protection that you need against stroke and other problems.  Do not use tobacco products, including cigarettes, chewing tobacco, and e-cigarettes. If you need help quitting, ask your health care provider.  If you have obstructive sleep apnea, manage your condition as told by your health care provider.  Do not drink alcohol.  Do not drink beverages that contain caffeine, such as coffee, soda, and tea.  Maintain a healthy weight. Do not use diet pills unless your health care provider approves. Diet pills may make heart problems worse.  Follow diet instructions as told by your health care provider.  Exercise regularly as told by your health care provider.  Keep all follow-up visits as told by your health care provider. This is important. PREVENTION  Avoid drinking beverages that contain caffeine or alcohol.  Avoid certain medicines, especially medicines that are used for breathing problems.  Avoid certain herbs and herbal medicines, such as those that contain ephedra or ginseng.  Do not use illegal drugs, such as cocaine and amphetamines.  Do not smoke.  Manage your high blood pressure. SEEK MEDICAL CARE IF:  You notice a change in the rate, rhythm, or strength of your heartbeat.  You are taking an anticoagulant and you notice increased bruising.  You tire more easily when you exercise or exert yourself. SEEK IMMEDIATE MEDICAL CARE IF:  You have chest pain, abdominal pain, sweating, or weakness.  You feel nauseous.  You notice  blood in your vomit, bowel movement, or urine.  You have shortness of breath.  You suddenly have swollen feet and ankles.  You feel dizzy.  You have sudden weakness or numbness of the face, arm, or leg, especially on one side of the body.  You have trouble speaking, trouble understanding, or both (aphasia).  Your face or your eyelid droops on one side. These symptoms may represent a serious problem that is an emergency. Do not wait to see if the symptoms will go away. Get medical help right away. Call your local emergency services (911 in the [place] Do not drive yourself to the hospital.   This information is not intended to replace advice given to you by your health care provider. Make sure you discuss any questions you have with your health care provider.   Document Released: 04/22/2005 Document Revised: 01/11/2015 Document Reviewed: 08/17/2014 Elsevier Interactive Patient Education [employer].

## 2015-09-17 NOTE — ED Notes (Signed)
Pt brought to ED by [facility] from home for complain of palpitation and raising on her HR, pt with a HR 140-150's on EMS arrival to her house, in and out of afib on EMS's EKG. No previous hx of afib, pt states she only have frequent PVC in the past. Pt denies any fever, chills, dizziness or discomfort at this time. VS BP 140/96, HR 138, R, 16, SPO2 100 % RA, CBG 144.

## 2015-09-19 ENCOUNTER — Encounter: Payer: Self-pay | Admitting: Internal Medicine

## 2015-09-19 ENCOUNTER — Ambulatory Visit (INDEPENDENT_AMBULATORY_CARE_PROVIDER_SITE_OTHER): Payer: Medicare Other | Admitting: Internal Medicine

## 2015-09-19 VITALS — BP 108/74 | HR 90 | Wt 101.0 lb

## 2015-09-19 DIAGNOSIS — F4322 Adjustment disorder with anxiety: Secondary | ICD-10-CM | POA: Diagnosis not present

## 2015-09-19 DIAGNOSIS — I480 Paroxysmal atrial fibrillation: Secondary | ICD-10-CM

## 2015-09-19 DIAGNOSIS — H7091 Unspecified mastoiditis, right ear: Secondary | ICD-10-CM | POA: Diagnosis not present

## 2015-09-19 MED ORDER — ALPRAZOLAM 0.25 MG PO TABS: 0.2500 mg | ORAL_TABLET | Freq: Two times a day (BID) | ORAL | Status: DC | PRN

## 2015-09-19 NOTE — Assessment & Plan Note (Signed)
Xanax prn  Potential benefits of a long term benzodiazepines  use as well as potential risks  and complications were explained to the patient and were aknowledged. 

## 2015-09-19 NOTE — Progress Notes (Signed)
Subjective:  Patient ID: Monica [name], female    DOB: [birth year]  Age: 70 y.o. MRN: [id number]  CC: No chief complaint on file.   HPI TAMIE GEIGER presents for ER visit by ambulance on 5/14 for an acute A fib x 2 hrs that has self resolved. Nessiah may have had an episode 1 mo ago. Pt is on ASA. ER notes, tests reviewed    Outpatient Prescriptions Prior to Visit  Medication Sig Dispense Refill  . acetaminophen (TYLENOL) 500 MG tablet Take 500 mg by mouth every 6 (six) hours as needed for moderate pain or headache.    Marland Kitchen aspirin EC 325 MG tablet Take 1 tablet (325 mg total) by mouth daily. 30 tablet 0  . calcium carbonate (OSCAL) 1500 (600 Ca) MG TABS tablet Take 600 mg of elemental calcium by mouth daily.    . calcium carbonate (TUMS) 500 MG chewable tablet Chew 1 tablet (200 mg of elemental calcium total) by mouth daily. 100 tablet 3  . cetirizine (ZYRTEC) 10 MG tablet Take 5 mg by mouth daily as needed for allergies.    . Cholecalciferol (VITAMIN D) 1000 UNITS capsule Take 1,000 Units by mouth daily.      . Estradiol 10 MCG TABS Place 1 tablet vaginally 2 (two) times a week.     . fluticasone (FLONASE) 50 MCG/ACT nasal spray Place 2 sprays into the nose as needed for allergies or rhinitis.     . Lactobacillus Rhamnosus, GG, (CULTURELLE PO) Take 1 tablet by mouth daily.    . Multiple Vitamins-Minerals (CENTRUM SILVER PO) Take 1 tablet by mouth 3 (three) times a week. Take 1 tablet by mouth twice a week    . omeprazole (PRILOSEC) 20 MG capsule Take 20 mg by mouth daily as needed (acid reflux).     . ranitidine (ZANTAC) 150 MG tablet Take 150 mg by mouth daily as needed for heartburn.     No facility-administered medications prior to visit.    ROS Review of Systems  Constitutional: Negative for chills, activity change, appetite change, fatigue and unexpected weight change.  HENT: Negative for congestion, mouth sores and sinus pressure.   Eyes: Negative for visual disturbance.    Respiratory: Negative for cough and chest tightness.   Cardiovascular: Positive for palpitations. Negative for chest pain.  Gastrointestinal: Negative for nausea and abdominal pain.  Genitourinary: Negative for frequency, difficulty urinating and vaginal pain.  Musculoskeletal: Negative for back pain and gait problem.  Skin: Negative for pallor and rash.  Neurological: Negative for dizziness, tremors, weakness, numbness and headaches.  Psychiatric/Behavioral: Negative for confusion and sleep disturbance.    Objective:  BP 108/74 mmHg  Pulse 90  Wt 101 lb (45.813 kg)  SpO2 98%  BP Readings from Last 3 Encounters:  09/19/15 108/74  09/17/15 154/79  09/07/15 108/60    Wt Readings from Last 3 Encounters:  09/19/15 101 lb (45.813 kg)  09/17/15 104 lb (47.174 kg)  09/07/15 103 lb (46.72 kg)    Physical Exam  Constitutional: She appears well-developed. No distress.  HENT:  Head: Normocephalic.  Right Ear: External ear normal.  Left Ear: External ear normal.  Nose: Nose normal.  Mouth/Throat: Oropharynx is clear and moist.  Eyes: Conjunctivae are normal. Pupils are equal, round, and reactive to light. Right eye exhibits no discharge. Left eye exhibits no discharge.  Neck: Normal range of motion. Neck supple. No JVD present. No tracheal deviation present. No thyromegaly present.  Cardiovascular: Normal rate, regular rhythm  and normal heart sounds.   Pulmonary/Chest: No stridor. No respiratory distress. She has no wheezes.  Abdominal: Soft. Bowel sounds are normal. She exhibits no distension and no mass. There is no tenderness. There is no rebound and no guarding.  Musculoskeletal: She exhibits no edema or tenderness.  Lymphadenopathy:    She has no cervical adenopathy.  Neurological: She displays normal reflexes. No cranial nerve deficit. She exhibits normal muscle tone. Coordination normal.  Skin: No rash noted. No erythema.  Psychiatric: She has a normal mood and affect. Her  behavior is normal. Judgment and thought content normal.    Lab Results  Component Value Date   WBC 8.6 09/17/2015   HGB 13.5 09/17/2015   HCT 39.1 09/17/2015   PLT 302 09/17/2015   GLUCOSE 121* 09/17/2015   CHOL 157 04/13/2014   TRIG 71.0 04/13/2014   HDL 65.60 04/13/2014   LDLCALC 77 04/13/2014   ALT 17 09/28/2014   AST 28 09/28/2014   NA 136 09/17/2015   K 3.6 09/17/2015   CL 101 09/17/2015   CREATININE 0.86 09/17/2015   BUN 12 09/17/2015   CO2 24 09/17/2015   TSH 4.381 09/17/2015    Dg Chest 2 View  09/17/2015  CLINICAL DATA:  Initial evaluation for acute tachycardia. EXAM: CHEST  2 VIEW COMPARISON:  Prior study from 10/23/2009. FINDINGS: Cardiac mediastinal silhouettes are stable in size and contour, and remain within normal limits. Lungs are normally inflated. Scattered pleural-parenchymal scarring at the lung apices, likely unchanged. No consolidative airspace disease. No pulmonary edema or pleural effusion. No pneumothorax. No acute osseus abnormality. IMPRESSION: 1. No active cardiopulmonary disease. 2. Bilateral pleural parenchymal scarring at the lung apices, similar to prior. Electronically Signed   By: Jeannine Boga M.D.   On: 09/17/2015 22:07    Assessment & Plan:   There are no diagnoses linked to this encounter. I am having Ms. Pulis maintain her Vitamin D, Estradiol, fluticasone, calcium carbonate, Multiple Vitamins-Minerals (CENTRUM SILVER PO), (Lactobacillus Rhamnosus, GG, (CULTURELLE PO)), omeprazole, cetirizine, acetaminophen, calcium carbonate, ranitidine, and aspirin EC.  No orders of the defined types were placed in this encounter.     Follow-up: No Follow-up on file.  Walker Kehr, MD

## 2015-09-19 NOTE — Assessment & Plan Note (Addendum)
5/17 two episodes under 2 hrs ASA qd ECHO Card ref Try Xanax - it may help

## 2015-09-19 NOTE — Progress Notes (Signed)
Pre visit review using our clinic review tool, if applicable. No additional management support is needed unless otherwise documented below in the visit note. 

## 2015-09-19 NOTE — Assessment & Plan Note (Signed)
No relapse 

## 2015-09-28 DIAGNOSIS — L570 Actinic keratosis: Secondary | ICD-10-CM | POA: Diagnosis not present

## 2015-09-28 DIAGNOSIS — D225 Melanocytic nevi of trunk: Secondary | ICD-10-CM | POA: Diagnosis not present

## 2015-09-28 DIAGNOSIS — L821 Other seborrheic keratosis: Secondary | ICD-10-CM | POA: Diagnosis not present

## 2015-09-28 DIAGNOSIS — Z86018 Personal history of other benign neoplasm: Secondary | ICD-10-CM | POA: Diagnosis not present

## 2015-09-28 DIAGNOSIS — D485 Neoplasm of uncertain behavior of skin: Secondary | ICD-10-CM | POA: Diagnosis not present

## 2015-09-29 ENCOUNTER — Ambulatory Visit (INDEPENDENT_AMBULATORY_CARE_PROVIDER_SITE_OTHER): Payer: Medicare Other | Admitting: Internal Medicine

## 2015-09-29 ENCOUNTER — Encounter: Payer: Self-pay | Admitting: Internal Medicine

## 2015-09-29 ENCOUNTER — Telehealth: Payer: Self-pay | Admitting: Internal Medicine

## 2015-09-29 VITALS — BP 116/70 | HR 96 | Temp 98.4°F | Wt 100.0 lb

## 2015-09-29 DIAGNOSIS — R519 Headache, unspecified: Secondary | ICD-10-CM | POA: Insufficient documentation

## 2015-09-29 DIAGNOSIS — I480 Paroxysmal atrial fibrillation: Secondary | ICD-10-CM

## 2015-09-29 DIAGNOSIS — R51 Headache: Secondary | ICD-10-CM

## 2015-09-29 DIAGNOSIS — G44319 Acute post-traumatic headache, not intractable: Secondary | ICD-10-CM

## 2015-09-29 MED ORDER — BUTALBITAL-APAP-CAFFEINE 50-325-40 MG PO TABS: 0.5000 | ORAL_TABLET | Freq: Four times a day (QID) | ORAL | Status: DC | PRN

## 2015-09-29 MED ORDER — IBUPROFEN 600 MG PO TABS: 600.0000 mg | ORAL_TABLET | Freq: Three times a day (TID) | ORAL | Status: DC | PRN

## 2015-09-29 NOTE — Assessment & Plan Note (Signed)
Poss migraine - weather change aggravated Head contusion - mild Potential benefits of Fioricet use as well as potential risks  and complications were explained to the patient and were aknowledged.   Fioricet prn low dose Ibuprofen prn Rest CT sinuses/head if not better

## 2015-09-29 NOTE — Progress Notes (Signed)
Subjective:  Patient ID: Monica [name], female    DOB: 1945/07/30  Age: 70 y.o. MRN: [id number]  CC: No chief complaint on file.   HPI SAPHERA BRAWN presents for  HA x 6 days ago. Pt hit something in construction w/her head.  C/o R ear pain x 3 days  Outpatient Prescriptions Prior to Visit  Medication Sig Dispense Refill  . acetaminophen (TYLENOL) 500 MG tablet Take 500 mg by mouth every 6 (six) hours as needed for moderate pain or headache.    . ALPRAZolam (XANAX) 0.25 MG tablet Take 1 tablet (0.25 mg total) by mouth 2 (two) times daily as needed for anxiety (palpitations). 60 tablet 1  . calcium carbonate (OSCAL) 1500 (600 Ca) MG TABS tablet Take 600 mg of elemental calcium by mouth daily.    . calcium carbonate (TUMS) 500 MG chewable tablet Chew 1 tablet (200 mg of elemental calcium total) by mouth daily. 100 tablet 3  . Cholecalciferol (VITAMIN D) 1000 UNITS capsule Take 1,000 Units by mouth daily.      . Estradiol 10 MCG TABS Place 1 tablet vaginally 2 (two) times a week.     . fluticasone (FLONASE) 50 MCG/ACT nasal spray Place 2 sprays into the nose as needed for allergies or rhinitis.     . Lactobacillus Rhamnosus, GG, (CULTURELLE PO) Take 1 tablet by mouth daily.    . Multiple Vitamins-Minerals (CENTRUM SILVER PO) Take 1 tablet by mouth 3 (three) times a week. Take 1 tablet by mouth twice a week    . omeprazole (PRILOSEC) 20 MG capsule Take 20 mg by mouth daily as needed (acid reflux).     . ranitidine (ZANTAC) 150 MG tablet Take 150 mg by mouth daily as needed for heartburn.    . cetirizine (ZYRTEC) 10 MG tablet Take 5 mg by mouth daily as needed for allergies. Reported on 09/29/2015    . aspirin EC 325 MG tablet Take 1 tablet (325 mg total) by mouth daily. (Patient not taking: Reported on 09/29/2015) 30 tablet 0   No facility-administered medications prior to visit.    ROS Review of Systems  Constitutional: Negative for chills, activity change, appetite change, fatigue  and unexpected weight change.  HENT: Negative for congestion, mouth sores and sinus pressure.   Eyes: Negative for visual disturbance.  Respiratory: Negative for cough and chest tightness.   Gastrointestinal: Negative for nausea and abdominal pain.  Genitourinary: Negative for frequency, difficulty urinating and vaginal pain.  Musculoskeletal: Negative for back pain and gait problem.  Skin: Negative for pallor and rash.  Neurological: Positive for headaches. Negative for dizziness, tremors, weakness and numbness.  Psychiatric/Behavioral: Negative for confusion and sleep disturbance.    Objective:  BP 116/70 mmHg  Pulse 96  Temp(Src) 98.4 F (36.9 C) (Oral)  Wt 100 lb (45.36 kg)  SpO2 97%  BP Readings from Last 3 Encounters:  09/29/15 116/70  09/19/15 108/74  09/17/15 154/79    Wt Readings from Last 3 Encounters:  09/29/15 100 lb (45.36 kg)  09/19/15 101 lb (45.813 kg)  09/17/15 104 lb (47.174 kg)    Physical Exam  Constitutional: She appears well-nourished. She appears distressed.  HENT:  Left Ear: External ear normal.  Mouth/Throat: Oropharynx is clear and moist. No oropharyngeal exudate.  Cardiovascular: Regular rhythm.  Exam reveals no gallop and no friction rub.   No murmur heard. Musculoskeletal: She exhibits no tenderness.  Neurological: She displays normal reflexes. No cranial nerve deficit. She exhibits normal muscle  tone. Coordination normal.  Psychiatric: She has a normal mood and affect. Her behavior is normal. Judgment and thought content normal.  ear tube in R TM Head NT  Lab Results  Component Value Date   WBC 8.6 09/17/2015   HGB 13.5 09/17/2015   HCT 39.1 09/17/2015   PLT 302 09/17/2015   GLUCOSE 121* 09/17/2015   CHOL 157 04/13/2014   TRIG 71.0 04/13/2014   HDL 65.60 04/13/2014   LDLCALC 77 04/13/2014   ALT 17 09/28/2014   AST 28 09/28/2014   NA 136 09/17/2015   K 3.6 09/17/2015   CL 101 09/17/2015   CREATININE 0.86 09/17/2015   BUN 12  09/17/2015   CO2 24 09/17/2015   TSH 4.381 09/17/2015    Dg Chest 2 View  09/17/2015  CLINICAL DATA:  Initial evaluation for acute tachycardia. EXAM: CHEST  2 VIEW COMPARISON:  Prior study from 10/23/2009. FINDINGS: Cardiac mediastinal silhouettes are stable in size and contour, and remain within normal limits. Lungs are normally inflated. Scattered pleural-parenchymal scarring at the lung apices, likely unchanged. No consolidative airspace disease. No pulmonary edema or pleural effusion. No pneumothorax. No acute osseus abnormality. IMPRESSION: 1. No active cardiopulmonary disease. 2. Bilateral pleural parenchymal scarring at the lung apices, similar to prior. Electronically Signed   By: Jeannine Boga M.D.   On: 09/17/2015 22:07    Assessment & Plan:   There are no diagnoses linked to this encounter. I am having Ms. Hai maintain her Vitamin D, Estradiol, fluticasone, calcium carbonate, Multiple Vitamins-Minerals (CENTRUM SILVER PO), (Lactobacillus Rhamnosus, GG, (CULTURELLE PO)), omeprazole, cetirizine, acetaminophen, calcium carbonate, ranitidine, ALPRAZolam, aspirin EC, and tobramycin-dexamethasone.  Meds ordered this encounter  Medications  . aspirin EC 81 MG tablet    Sig: Take 81 mg by mouth daily.  Marland Kitchen tobramycin-dexamethasone (TOBRADEX) ophthalmic solution    Sig: Place 1 drop into the right eye every 4 (four) hours while awake.     Follow-up: No Follow-up on file.  Walker Kehr, MD

## 2015-09-29 NOTE — Telephone Encounter (Signed)
E NOTE: All timestamps contained within this report are represented as [place] Standard Time. CONFIDENTIALTY NOTICE: This fax transmission is intended only for the addressee. It contains information that is legally privileged, confidential or otherwise protected from use or disclosure. If you are not the intended recipient, you are strictly prohibited from reviewing, disclosing, copying using or disseminating any of this information or taking any action in reliance on or regarding this information. If you have received this fax in error, please notify us immediately by telephone so that we can arrange for its return to [place]. Phone: [phone], Toll-Free: [phone], Fax: [phone] Page: 1 of 1 Call Id: [id number] [facility] - [facility] [place] Patient Name: ANELLY DOVE DOB: 08-28-1945 Initial Comment Caller States she has been getting headaches all week Nurse Assessment Nurse: Dimas Chyle, RN, Dellis Filbert Date/Time (Eastern Time): 09/29/2015 8:43:36 AM Confirm and document reason for call. If symptomatic, describe symptoms. You must click the next button to save text entered. ---Caller states she has been getting headaches all week. Hit head earlier in the week. Has the patient traveled out of the country within the last 30 days? ---No Does the patient have any new or worsening symptoms? ---Yes Will a triage be completed? ---Yes Related visit to physician within the last 2 weeks? ---Yes Does the PT have any chronic conditions? (i.e. diabetes, asthma, etc.) ---No Is this a behavioral health or substance abuse call? ---No Guidelines Guideline Title Affirmed Question Affirmed Notes Headache [1] MODERATE headache (e.g., interferes with normal activities) AND [2] present > 24 hours AND [3] unexplained (Exceptions: analgesics not tried, typical migraine, or headache part of viral illness) Final Disposition User See Physician  within 24 Hours Dimas Chyle, RN, [employer] Referrals REFERRED TO PCP OFFICE Disagree/Comply: Leta Baptist

## 2015-09-29 NOTE — Progress Notes (Signed)
Pre visit review using our clinic review tool, if applicable. No additional management support is needed unless otherwise documented below in the visit note. 

## 2015-09-29 NOTE — Patient Instructions (Signed)
Call if not better 

## 2015-09-29 NOTE — Assessment & Plan Note (Signed)
On ASA 

## 2015-10-06 ENCOUNTER — Ambulatory Visit (HOSPITAL_COMMUNITY): Payer: Medicare Other | Attending: Cardiology

## 2015-10-06 ENCOUNTER — Other Ambulatory Visit: Payer: Self-pay

## 2015-10-06 DIAGNOSIS — I4891 Unspecified atrial fibrillation: Secondary | ICD-10-CM | POA: Diagnosis present

## 2015-10-06 DIAGNOSIS — I480 Paroxysmal atrial fibrillation: Secondary | ICD-10-CM | POA: Diagnosis not present

## 2015-10-06 DIAGNOSIS — I071 Rheumatic tricuspid insufficiency: Secondary | ICD-10-CM | POA: Diagnosis not present

## 2015-10-06 DIAGNOSIS — R002 Palpitations: Secondary | ICD-10-CM | POA: Diagnosis not present

## 2015-10-06 DIAGNOSIS — I371 Nonrheumatic pulmonary valve insufficiency: Secondary | ICD-10-CM | POA: Diagnosis not present

## 2015-10-20 ENCOUNTER — Encounter: Payer: Self-pay | Admitting: Cardiovascular Disease

## 2015-10-20 ENCOUNTER — Ambulatory Visit (INDEPENDENT_AMBULATORY_CARE_PROVIDER_SITE_OTHER): Payer: Medicare Other | Admitting: Cardiovascular Disease

## 2015-10-20 VITALS — BP 100/64 | HR 85 | Ht 62.0 in | Wt 101.0 lb

## 2015-10-20 DIAGNOSIS — I480 Paroxysmal atrial fibrillation: Secondary | ICD-10-CM

## 2015-10-20 MED ORDER — METOPROLOL TARTRATE 25 MG PO TABS: 12.5000 mg | ORAL_TABLET | Freq: Every day | ORAL | Status: DC | PRN

## 2015-10-20 MED ORDER — ASPIRIN EC 81 MG PO TBEC: 81.0000 mg | DELAYED_RELEASE_TABLET | Freq: Every day | ORAL | Status: DC

## 2015-10-20 NOTE — Progress Notes (Signed)
Cardiology consultation Note    Date:  10/20/2015   ID:  Monica [name], DOB 01/28/1946, MRN [id number]  Requesting physician:  Walker Kehr, MD  Cardiologist:   Sanda Klein, MD  Reason for consultation: Newly diagnosed paroxysmal atrial fibrillation  Chief Complaint  Patient presents with  . 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   On Mother's Day she experienced sudden onset of rapid palpitations. These were very unpleasant, but there was no associated dizziness, syncope, dyspnea or chest discomfort. After they had been gong on for about 2 hours she called EMS. Reportedly the "rhythm strips" performed by EMS showed atrial fibrillation and this was confirmed on evaluation by the emergency room physician. Not long after she arrived in the emergency room the rhythm converted to normal sinus rhythm and there are no documents from the hospital showing atrial fibrillation. Unfortunately the EMS electrocardiogram was not scanned into the hospital system and I cannot locate a copy. She recalls having a similar episode about one month earlier, that was much shorter. On both occasions she was taking Zyrtec (without a decongestant) and she wonders whether this may have had something to do with the arrhythmia.  Evaluation has included a normal echocardiogram with a small left atrial size, normal TSH and generally normal lab work. She is physically active and denies exertional dyspnea or angina. She has no history of heart problems, stroke, TIA, peripheral embolism, bleeding problems. She has had "PVCs" for many years and initially thought that her palpitations might simply represent PVCs. She finds herself under a lot of emotional stress recently since her husband has early cognitive impairment and their interaction  has often become frustrating.  Other comorbid conditions include bronchiectasis, gastroesophageal reflux disease, irritable bowel syndrome, osteoporosis, chronic sinusitis and mild anxiety disorder, none of which are currently a big problem.      Past Medical History  Diagnosis Date  . Allergic rhinitis   . GERD (gastroesophageal reflux disease)   . Menopausal disorder   . Insomnia   . Hemorrhoids   . Bronchiectasis   . Hearing loss   . TMJ syndrome   . Palpitations   . Urticaria   . Chronic sinusitis   . Osteoporosis   . Diverticulosis   . Adenomatous colon polyp 10/2003  . Allergy     SEASONAL    Past Surgical History  Procedure Laterality Date  . Tympanostomy    . Nasal sinus surgery    . Colonoscopy      Current Medications: Outpatient Prescriptions Prior to Visit  Medication Sig Dispense Refill  . acetaminophen (TYLENOL) 500 MG tablet Take 500 mg by mouth every 6 (six) hours as needed for moderate pain or headache.    . ALPRAZolam (XANAX) 0.25 MG tablet Take 1 tablet (0.25 mg total) by mouth 2 (two) times daily as needed for anxiety (palpitations). 60 tablet 1  . butalbital-acetaminophen-caffeine (FIORICET) 50-325-40 MG tablet Take 0.5-1 tablets by mouth every 6 (six) hours as needed for headache. 60 tablet 1  . calcium carbonate (OSCAL) 1500 (600 Ca) MG TABS tablet Take 600 mg of elemental calcium by mouth daily.    . calcium carbonate (TUMS) 500 MG chewable tablet Chew 1 tablet (200 mg of elemental calcium total) by mouth daily. 100 tablet 3  .  Cholecalciferol (VITAMIN D) 1000 UNITS capsule Take 1,000 Units by mouth daily.      . Estradiol 10 MCG TABS Place 1 tablet vaginally 2 (two) times a week.     . fluticasone (FLONASE) 50 MCG/ACT nasal spray Place 2 sprays into the nose as needed for allergies or rhinitis.     Marland Kitchen ibuprofen (ADVIL,MOTRIN) 600 MG tablet Take 1 tablet (600 mg total) by mouth every 8 (eight) hours as needed. 60 tablet 0  . Lactobacillus  Rhamnosus, GG, (CULTURELLE PO) Take 1 tablet by mouth daily.    . Multiple Vitamins-Minerals (CENTRUM SILVER PO) Take 1 tablet by mouth 3 (three) times a week. Take 1 tablet by mouth twice a week    . omeprazole (PRILOSEC) 20 MG capsule Take 20 mg by mouth daily as needed (acid reflux).     . ranitidine (ZANTAC) 150 MG tablet Take 150 mg by mouth daily as needed for heartburn.    . tobramycin-dexamethasone (TOBRADEX) ophthalmic solution Place 1 drop into the right ear as directed.     Marland Kitchen aspirin EC 81 MG tablet Take 81 mg by mouth daily.    . cetirizine (ZYRTEC) 10 MG tablet Take 5 mg by mouth daily as needed for allergies. Reported on 09/29/2015     No facility-administered medications prior to visit.     Allergies:   Amoxicillin-pot clavulanate; Avelox; Bactrim; Benzonatate; Ciprofloxacin; and Sulfonamide derivatives   Social History   Social History  . Marital Status: Married    Spouse Name: [name]  . Number of Children: 0  . Years of Education: N/A   Occupational History  . retired Therapist, sports    Social History Main Topics  . Smoking status: Never Smoker   . Smokeless tobacco: Never Used  . Alcohol Use: 1.2 oz/week    2 Glasses of wine per week  . Drug Use: No  . Sexual Activity: Not Asked   Other Topics Concern  . None   Social History Narrative     Family History:  The patient's family history includes Cancer in her maternal grandmother, mother, and paternal uncle; Colon cancer (age of onset: 68) in her mother; Heart disease in her father and maternal grandfather; Hypertension in her father; Rectal cancer in her mother; Seizures in her mother.   ROS:   Please see the history of present illness.    ROS All other systems reviewed and are negative.   PHYSICAL EXAM:   VS:  BP 100/64 mmHg  Pulse 85  Ht 5\' 2"  (1.575 m)  Wt 45.813 kg (101 lb)  BMI 18.47 kg/m2   GEN: Well nourished, well developed, in no acute distress HEENT: normal Neck: no JVD, carotid bruits, or  masses Cardiac: RRR; no murmurs, rubs, or gallops,no edema  Respiratory:  clear to auscultation bilaterally, normal work of breathing GI: soft, nontender, nondistended, + BS MS: no deformity or atrophy Skin: warm and dry, no rash Neuro:  Alert and Oriented x 3, Strength and sensation are intact Psych: euthymic mood, full affect  Wt Readings from Last 3 Encounters:  10/20/15 45.813 kg (101 lb)  09/29/15 45.36 kg (100 lb)  09/19/15 45.813 kg (101 lb)      Studies/Labs Reviewed:   EKG:  EKG is ordered today.  The ekg ordered today demonstrates Sinus rhythm, normal tracing, QTC 437 ms  Recent Labs: 09/17/2015: BUN 12; Creatinine, Ser 0.86; Hemoglobin 13.5; Magnesium 2.1; Platelets 302; Potassium 3.6; Sodium 136; TSH 4.381   Lipid Panel    Component  Value Date/Time   CHOL 157 04/13/2014 1156   TRIG 71.0 04/13/2014 1156   HDL 65.60 04/13/2014 1156   CHOLHDL 2 04/13/2014 1156   VLDL 14.2 04/13/2014 1156   LDLCALC 77 04/13/2014 1156    Additional studies/ records that were reviewed today include:  Notes from the emergency room and notes from Dr. Alain Marion    ASSESSMENT:    1. PAF (paroxysmal atrial fibrillation) [facility])      PLAN:   Mrs. Berthiaume appears to have lone atrial fibrillation.   Unfortunately, the diagnostic electrocardiogram was not memorialized. However, 2 physicians in the emergency room seemed to agree that the rhythm strips showed atrial fibrillation. Even if the arrhythmia diagnosis is accurate, I have some reluctance prescribing full dose anticoagulation.   Technically, her embolic risk is high enough by the CHADSVasc score of 2 (CHADS score 1), but the indication is rather "soft": she only obtains points for female gender and age (and biologically she seems much younger than her chronological age.   Reviewing the statistics on the [facility] anticoagulation evaluator, the chance of benefit (stroke prevention) is 1 in 37, while the risk of harm (bleeding) is 1 in  75. When one takes into account her very small body habitus, I believe the risk of bleeding with conventionally dosed oral anticoagulants might even be higher. Therefore, I decided to recommend aspirin therapy 81 mg daily. If she should have the slightest or most brief neurological event suspicion for TIA/CVA, the calculations would lean heavily favor full anticoagulation.  Antiarrhythmics did not appear to be indicated for what is an infrequent and minimally symptomatic arrhythmia. I don't think she would tolerate chronic beta blocker therapy or calcium channel blocker therapy since her blood pressure is already borderline low. I did decide to give her in "as needed" prescription for low-dose metoprolol tartrate for episodes of palpitations. If the palpitations become more frequent, I would recommend an event monitor in order to obtain a more confident rhythm diagnosis. At this point, the palpitations have subsided completely and rhythm monitoring is unlikely to be helpful.    Medication Adjustments/Labs and Tests Ordered: Current medicines are reviewed at length with the patient today.  Concerns regarding medicines are outlined above.  Medication changes, Labs and Tests ordered today are listed in the Patient Instructions below. Patient Instructions  Dr Sallyanne Kuster has recommended making the following medication changes: 1. TAKE Metoprolol tartrate 25 mg - take 0.5 tablet (12.5 mg total) by mouth daily as needed for palpitations  Dr Sallyanne Kuster recommends that you schedule a follow-up appointment in 6 months. You will receive a reminder letter in the mail two months in advance. If you don't receive a letter, please call our office to schedule the follow-up appointment.  If you need a refill on your cardiac medications before your next appointment, please call your pharmacy.    Signed, Sanda Klein, MD  10/20/2015 7:00 PM    [address], [place], [place]   [address] Phone: [phone]; Fax: [phone]

## 2015-10-20 NOTE — Patient Instructions (Addendum)
Dr Sallyanne Kuster has recommended making the following medication changes: 1. TAKE Metoprolol tartrate 25 mg - take 0.5 tablet (12.5 mg total) by mouth daily as needed for palpitations  Dr Sallyanne Kuster recommends that you schedule a follow-up appointment in 6 months. You will receive a reminder letter in the mail two months in advance. If you don't receive a letter, please call our office to schedule the follow-up appointment.  If you need a refill on your cardiac medications before your next appointment, please call your pharmacy.

## 2015-11-13 DIAGNOSIS — Z124 Encounter for screening for malignant neoplasm of cervix: Secondary | ICD-10-CM | POA: Diagnosis not present

## 2015-11-30 DIAGNOSIS — M810 Age-related osteoporosis without current pathological fracture: Secondary | ICD-10-CM | POA: Diagnosis not present

## 2015-11-30 DIAGNOSIS — M8589 Other specified disorders of bone density and structure, multiple sites: Secondary | ICD-10-CM | POA: Diagnosis not present

## 2015-12-11 ENCOUNTER — Ambulatory Visit (INDEPENDENT_AMBULATORY_CARE_PROVIDER_SITE_OTHER): Payer: Medicare Other | Admitting: Emergency Medicine

## 2015-12-11 DIAGNOSIS — J479 Bronchiectasis, uncomplicated: Secondary | ICD-10-CM

## 2015-12-11 LAB — PULMONARY FUNCTION TEST
DL/VA % PRED: 90 %
DL/VA: 4.12 ml/min/mmHg/L
DLCO UNC: 17.03 ml/min/mmHg
DLCO cor % pred: 78 %
DLCO cor: 16.92 ml/min/mmHg
DLCO unc % pred: 78 %
FEF 25-75 POST: 1.76 L/s
FEF 25-75 PRE: 1.62 L/s
FEF2575-%CHANGE-POST: 8 %
FEF2575-%PRED-POST: 100 %
FEF2575-%PRED-PRE: 92 %
FEV1-%Change-Post: 0 %
FEV1-%PRED-PRE: 97 %
FEV1-%Pred-Post: 97 %
FEV1-POST: 2.01 L
FEV1-Pre: 2 L
FEV1FVC-%CHANGE-POST: 2 %
FEV1FVC-%PRED-PRE: 103 %
FEV6-%CHANGE-POST: -1 %
FEV6-%Pred-Post: 95 %
FEV6-%Pred-Pre: 97 %
FEV6-PRE: 2.53 L
FEV6-Post: 2.49 L
FEV6FVC-%Pred-Post: 105 %
FEV6FVC-%Pred-Pre: 105 %
FVC-%CHANGE-POST: -1 %
FVC-%PRED-POST: 91 %
FVC-%Pred-Pre: 92 %
FVC-Post: 2.49 L
FVC-Pre: 2.53 L
POST FEV1/FVC RATIO: 81 %
PRE FEV1/FVC RATIO: 79 %
Post FEV6/FVC ratio: 100 %
Pre FEV6/FVC Ratio: 100 %

## 2015-12-11 NOTE — Progress Notes (Signed)
PFT performed today. 

## 2015-12-26 ENCOUNTER — Encounter: Payer: Self-pay | Admitting: Internal Medicine

## 2015-12-28 DIAGNOSIS — J309 Allergic rhinitis, unspecified: Secondary | ICD-10-CM | POA: Diagnosis not present

## 2015-12-28 DIAGNOSIS — H903 Sensorineural hearing loss, bilateral: Secondary | ICD-10-CM | POA: Diagnosis not present

## 2015-12-28 DIAGNOSIS — H6981 Other specified disorders of Eustachian tube, right ear: Secondary | ICD-10-CM | POA: Diagnosis not present

## 2016-01-02 ENCOUNTER — Ambulatory Visit (INDEPENDENT_AMBULATORY_CARE_PROVIDER_SITE_OTHER): Payer: Medicare Other | Admitting: Emergency Medicine

## 2016-01-02 ENCOUNTER — Encounter: Payer: Self-pay | Admitting: Emergency Medicine

## 2016-01-02 DIAGNOSIS — J479 Bronchiectasis, uncomplicated: Secondary | ICD-10-CM | POA: Diagnosis not present

## 2016-01-02 NOTE — Assessment & Plan Note (Signed)
Some increase in her cough and more labile symptoms. She believes that these are probably related to some worsening of her GERD and possibly also the impact of some increased rhinitis. She restarted her omeprazole recently in response to this. She does not like to take it regularly due to the risk of osteoporosis. I like for her to take the omeprazole twice a day for 1 week, then daily for one week then go back to when necessary. I also recommended that she go ahead and restart her nasal steroid for the fall season. Her pulmonary function testing done since last visit was reassuring. There is no evidence of asthma and I will not start a bronchodilator at this time. She still needs to have her CT scan of the chest. We will review the results once completed. She is at some risk for progression of her bronchiectasis or opportunistic infection/colonization.

## 2016-01-02 NOTE — Patient Instructions (Signed)
Your pulmonary function testing does not show signs of asthma or lung restriction.  Please Take your prilosec twice a day for one week. Then go to once a day for one week, then go back to using as needed for symptoms.  Start your fluticasone nasal spray, 2 sprays each nostril daily through the Fall allergy season.  Get your Ct scan as planned.  Please call our office if you develop colored mucous, more mucous, fevers, shortness of breath.  Get the flu shot this Fall Follow with Dr Lamonte Sakai in 6 months or sooner if you have any problems

## 2016-01-02 NOTE — Progress Notes (Signed)
Subjective:    Patient ID: Monica [name], female    DOB: 09/10/1945, 70 y.o.   MRN: [id number]  HPI Monica [name] is a 70 year old never smoker with a history of allergic rhinitis, GERD,urticaria. She has been followed in our office by Dr. Joya Gaskins for bronchiectasis. Her last CT scan of the chest was performed in 2011 which a person reviewed. This shows probably right middle lobe bronchiectatic changes. There are some micronodular disease could be consistent with atypical mycobacterial infection although this is never been documented. She was originally dx in setting recurrent bronchitis / PNA's. Had a bronchoscopy in 2003 - cx's negative. She has flared occasionally, last time was 2 yrs ago. She has been on scheduled BD's before, none currently, none for several years.   She does have more chronic sinus disease - does NSW reliably, flonase during allergy season, zyrtec during allergy season. GERD on prilosec, zantac prn.  Her ENT is Dr Thornell Mule, remote sinus sgy. She is treated with abx fairly frequently.   Denies any current cough, dyspnea. She is active, exercises.   Acute OV 01/02/16 -- This is an acute office visit today. Patient has a history of significant bronchiectasis, chronic cough with allergic rhinitis and GERD. Also since last visit she has been diagnosed with Paroxysmal atrial fibrillation. She had some episodes of increased GERD and also some increased rhinitis last week. She is coughing more during the day, mostly clear mucous. She underwent PFT on 8/7 that I have reviewed > shows normal airflows, normal volumes and diffusion. She restarted her prilosec and zantac and is currently taking. She is uses flonase prn, not using regularly right now. She is due for a CT chest soon. She does not use BD's   Review of Systems As per history of present illness  Past Medical History:  Diagnosis Date  . Adenomatous colon polyp 10/2003  . Allergic rhinitis   . Allergy    SEASONAL  .  Bronchiectasis   . Chronic sinusitis   . Diverticulosis   . GERD (gastroesophageal reflux disease)   . Hearing loss   . Hemorrhoids   . Insomnia   . Menopausal disorder   . Osteoporosis   . Palpitations   . TMJ syndrome   . Urticaria      Family History  Problem Relation Age of Onset  . Rectal cancer Mother     died age 1  . Seizures Mother   . Colon cancer Mother 90  . Cancer Mother   . Heart disease Father     dies age 18  . Hypertension Father   . Heart disease Maternal Grandfather   . Cancer Paternal Uncle     multiple uncles with cancer  . Cancer Maternal Grandmother      Social History   Social History  . Marital status: Married    Spouse name: Monica [name]  . Number of children: 0  . Years of education: N/A   Occupational History  . retired Therapist, sports    Social History Main Topics  . Smoking status: Never Smoker  . Smokeless tobacco: Never Used  . Alcohol use 1.2 oz/week    2 Glasses of wine per week  . Drug use: No  . Sexual activity: Not on file   Other Topics Concern  . Not on file   Social History Narrative  . No narrative on file  Worked as a Marine scientist, never had a positive PPD Grew up in [place], has traveled to [place] No  other inhaled exposures.   Allergies  Allergen Reactions  . Amoxicillin-Pot Clavulanate Diarrhea    Has patient had a PCN reaction causing immediate rash, facial/tongue/throat swelling, SOB or lightheadedness with hypotension:No Has patient had a PCN reaction causing severe rash involving mucus membranes or skin necrosis:No Has patient had a PCN reaction that required hospitalization:No Has patient had a PCN reaction occurring within the last 10 years:Yes If all of the above answers are "NO", then may proceed with Cephalosporin use  . Avelox [Moxifloxacin Hcl In Nacl] Nausea Only  . Bactrim [Sulfamethoxazole-Trimethoprim] Other (See Comments)    flushing  . Benzonatate Other (See Comments)    felt bad, out of sorts, disoriented.  .  Ciprofloxacin Rash  . Sulfonamide Derivatives Palpitations and Other (See Comments)    Felt flushed     Outpatient Medications Prior to Visit  Medication Sig Dispense Refill  . acetaminophen (TYLENOL) 500 MG tablet Take 500 mg by mouth every 6 (six) hours as needed for moderate pain or headache.    . ALPRAZolam (XANAX) 0.25 MG tablet Take 1 tablet (0.25 mg total) by mouth 2 (two) times daily as needed for anxiety (palpitations). 60 tablet 1  . aspirin EC 81 MG tablet Take 1 tablet (81 mg total) by mouth daily. 90 tablet 3  . butalbital-acetaminophen-caffeine (FIORICET) 50-325-40 MG tablet Take 0.5-1 tablets by mouth every 6 (six) hours as needed for headache. 60 tablet 1  . calcium carbonate (OSCAL) 1500 (600 Ca) MG TABS tablet Take 600 mg of elemental calcium by mouth daily.    . calcium carbonate (TUMS) 500 MG chewable tablet Chew 1 tablet (200 mg of elemental calcium total) by mouth daily. 100 tablet 3  . Cholecalciferol (VITAMIN D) 1000 UNITS capsule Take 1,000 Units by mouth daily.      . Estradiol 10 MCG TABS Place 1 tablet vaginally 2 (two) times a week.     . fluticasone (FLONASE) 50 MCG/ACT nasal spray Place 2 sprays into the nose as needed for allergies or rhinitis.     . fluticasone (FLONASE) 50 MCG/ACT nasal spray Place 2 sprays into both nostrils as directed.    Marland Kitchen ibuprofen (ADVIL,MOTRIN) 600 MG tablet Take 1 tablet (600 mg total) by mouth every 8 (eight) hours as needed. 60 tablet 0  . Lactobacillus Rhamnosus, GG, (CULTURELLE PO) Take 1 tablet by mouth daily.    Marland Kitchen loratadine (CLARITIN) 10 MG tablet Take 10 mg by mouth as directed.    . metoprolol tartrate (LOPRESSOR) 25 MG tablet Take 0.5 tablets (12.5 mg total) by mouth daily as needed (palpitations). 30 tablet 3  . Multiple Vitamins-Minerals (CENTRUM SILVER PO) Take 1 tablet by mouth 3 (three) times a week. Take 1 tablet by mouth twice a week    . omeprazole (PRILOSEC) 20 MG capsule Take 20 mg by mouth daily as needed (acid  reflux).     . ranitidine (ZANTAC) 150 MG tablet Take 150 mg by mouth daily as needed for heartburn.    . tobramycin-dexamethasone (TOBRADEX) ophthalmic solution Place 1 drop into the right ear as directed.      No facility-administered medications prior to visit.          Objective:   Physical Exam Vitals:   01/02/16 1111  BP: 130/80  Pulse: 96  SpO2: 100%  Weight: 100 lb (45.4 kg)  Height: 5\' 2"  (1.575 m)   'Gen: Pleasant, thin woman, well-appearing, in no distress,  normal affect  ENT: No lesions,  mouth clear,  oropharynx clear, no postnasal drip  Neck: No JVD, no TMG, no carotid bruits  Lungs: No use of accessory muscles, clear without rales or rhonchi, no wheeze on a forced expiration.   Cardiovascular: RRR, heart sounds normal, no murmur or gallops, no peripheral edema  Musculoskeletal: No deformities, no cyanosis or clubbing  Neuro: alert, non focal  Skin: Warm, no lesions or rashes    11/02/09 --  Comparison: Chest x-ray 10/31/2009 and CT abdomen 06/28/2008.  Findings: A small right thyroid nodule is nonspecific. Biapical pleural parenchymal scarring, with subpleural extension. There is peribronchovascular nodularity and bronchiectasis in the right middle lobe and right lower lobe. Peribronchovascular ground-glass is seen in the lingula and left lower lobe. That seen in the inferior left lower lobe (image 83), appears worse than on 06/28/2008, and is associated with mucoid impaction. No pleural fluid. Airway is otherwise unremarkable. Inspiratory and expiratory imaging shows no evidence of air trapping.  Incidental imaging of the upper abdomen shows a sub centimeter low attenuation lesion in the left hepatic lobe, stable. No worrisome lytic or sclerotic lesions. There are scattered pulmonary nodules, measuring up to 8 mm in the subpleural right lower lobe. Some of these are seen on 06/28/2008.  IMPRESSION:  Scattered areas of  nodularity, peribronchovascular nodularity, peribronchovascular ground-glass, bronchiectasis and mucoid impaction. These findings can be seen with a chronic infectious process, such as Mycobacterium avium complex (MAC).       Assessment & Plan:  Bronchiectasis without acute exacerbation Some increase in her cough and more labile symptoms. She believes that these are probably related to some worsening of her GERD and possibly also the impact of some increased rhinitis. She restarted her omeprazole recently in response to this. She does not like to take it regularly due to the risk of osteoporosis. I like for her to take the omeprazole twice a day for 1 week, then daily for one week then go back to when necessary. I also recommended that she go ahead and restart her nasal steroid for the fall season. Her pulmonary function testing done since last visit was reassuring. There is no evidence of asthma and I will not start a bronchodilator at this time. She still needs to have her CT scan of the chest. We will review the results once completed. She is at some risk for progression of her bronchiectasis or opportunistic infection/colonization.  Baltazar Apo, MD, PhD 01/02/2016, 11:36 AM [facility] [phone] or if no answer [phone]

## 2016-01-10 ENCOUNTER — Ambulatory Visit (INDEPENDENT_AMBULATORY_CARE_PROVIDER_SITE_OTHER)
Admission: RE | Admit: 2016-01-10 | Discharge: 2016-01-10 | Disposition: A | Discharge status: Home / Self Care | Payer: Medicare Other | Source: Ambulatory Visit | Attending: Emergency Medicine | Admitting: Emergency Medicine

## 2016-01-10 DIAGNOSIS — J479 Bronchiectasis, uncomplicated: Secondary | ICD-10-CM

## 2016-01-14 ENCOUNTER — Encounter: Payer: Self-pay | Admitting: Emergency Medicine

## 2016-01-15 NOTE — Telephone Encounter (Signed)
Please see pt's email in regard to her CT results and continued cough.   BQ - Please advise as RB is out of office. Thanks!

## 2016-01-17 ENCOUNTER — Ambulatory Visit (INDEPENDENT_AMBULATORY_CARE_PROVIDER_SITE_OTHER): Payer: Medicare Other | Admitting: Neurology

## 2016-01-17 ENCOUNTER — Encounter: Payer: Self-pay | Admitting: Neurology

## 2016-01-17 VITALS — BP 112/68 | HR 90 | Ht 62.0 in | Wt 101.2 lb

## 2016-01-17 DIAGNOSIS — R519 Headache, unspecified: Secondary | ICD-10-CM

## 2016-01-17 DIAGNOSIS — H832X9 Labyrinthine dysfunction, unspecified ear: Secondary | ICD-10-CM

## 2016-01-17 DIAGNOSIS — R51 Headache: Secondary | ICD-10-CM

## 2016-01-17 DIAGNOSIS — G450 Vertebro-basilar artery syndrome: Secondary | ICD-10-CM

## 2016-01-17 DIAGNOSIS — R42 Dizziness and giddiness: Secondary | ICD-10-CM

## 2016-01-17 NOTE — Patient Instructions (Addendum)
Remember to drink plenty of fluid, eat healthy meals and do not skip any meals. Try to eat protein with a every meal and eat a healthy snack such as fruit or nuts in between meals. Try to keep a regular sleep-wake schedule and try to exercise daily, particularly in the form of walking, 20-30 minutes a day, if you can.   As far as your medications are concerned, I would like to suggest  As far as diagnostic testing: MRi brain and MRA head, Vestibular Therapy  I would like to see you back as needed, sooner if we need to. Please call us with any interim questions, concerns, problems, updates or refill requests.   Our phone number is [phone]. We also have an after hours call service for urgent matters and there is a physician on-call for urgent questions. For any emergencies you know to call 911 or go to the nearest emergency room

## 2016-01-17 NOTE — Progress Notes (Signed)
[facility]    Provider:  Dr Jaynee Eagles Referring Provider: Vicie Mutters MD Primary Care Physician:  Walker Kehr, MD  CC:  disequilibirum  HPI:  Monica [name] is a 70 y.o. female here as a referral from Dr. Thornell Mule for disequilibrium since mastoiditis a year ago. 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 She had drainage out of the ear several years ago and during drainage she got very dizzy with room spinning and since then she has been left with feeling lightheaded, dizzy, if she crosses a street and looking to each side it worsens for example. Her balance is great, she does Tai Chi and can balance well, no falls, no coordination problems, no shuffling, walking normal, no physical manifestations. Just feels dizzy/lightheaded. No room spinning except for the first incident triggered by aspirating the right ear. The feeling is continuous, 24x7, it abates a bit sometimes and then it gets worse. Not positional, can be there with sitting, standing, laying down. She feels is worse when moving her head to either side and she becomes more aware of it, no other known triggers, nothing she she can pinpoint that makes it worse or better. No nausea or vomiting. She has not tried any medications. She was treated in the past with antibiotics, ear drops and others for her infection but no clear association with any medication. Denies history of migraines or FHx of migraines, mother had a seizure in the 25s, brother had a neurologic disorder with nodules in the brain which were possibly AVMs. No numbness or tingling in toes. She has new onset headache in the temples. There is some stress in her life due to early cognitive impairment and probably dementia in her husband.  Patient reports pulsatile tinnitus in the right ear with a FHx of vascular malformations.    Reviewed notes, labs and imaging from outside physicians, which showed: Reviewed notes from the [facility]. She sustained a sudden sensorineural hearing loss several years ago with development of chronic disequilibrium without true vertigo or vertigo with position change. She was diagnosed with chronic otorrhea and her right ear and was treated with long-term antibiotics (Cefuroxime). She was seen multiple times for continued right ear drainage. She finished oral azithromycin as well. Her husband has developed a cognitive impairment and probably early dementia which is stressful. She is on multiple medications such as Singulair, Flonase, probiotics, Zyrtec. 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  BUN 12, creatinine 0.86, TSH 4.381 09/17/2015.  Review of Systems: Patient complains of symptoms per HPI as well as the following symptoms: Weight loss, fatigue, easy bruising, cough, palpitations, hearing loss, ringing in ears, allergies, skin sensitivity, headache, insomnia, not enough sleep. Pertinent negatives per HPI. All others negative.   Social History   Social History  . Marital status: Married    Spouse name: [name]  . Number of children: 0  . Years of education: BSN   Occupational History  . retired Therapist, sports    Social History Main Topics  . Smoking status: Never Smoker  . Smokeless tobacco: Never Used  .  Alcohol use 1.2 oz/week    2 Glasses of wine per week  . Drug use: No  . Sexual activity: Not on file   Other Topics Concern  . Not on file   Social History Narrative   Lives with husband    Caffeine use: 1 cup tea per day    Family History  Problem Relation Age of Onset  . Rectal cancer Mother     died age 3  . Seizures Mother   . Colon cancer Mother 39  . Cancer Mother   . Heart disease Father     dies age 33  . Hypertension Father   . Heart disease Maternal Grandfather   . AVM Brother   . Cancer Maternal Grandmother   . Cancer Paternal Uncle     multiple uncles with cancer    Past Medical History:  Diagnosis Date  . Adenomatous colon polyp 10/2003  . Allergic rhinitis   . Allergy    SEASONAL  . Bronchiectasis   . Chronic sinusitis   . Diverticulosis   . GERD (gastroesophageal reflux disease)   . Hearing loss   . Hemorrhoids   . Insomnia   . Menopausal disorder   . Osteoporosis   . Palpitations   . TMJ syndrome   . Urticaria     Past Surgical History:  Procedure Laterality Date  . COLONOSCOPY    . NASAL SINUS SURGERY    . TYMPANOSTOMY      Current Outpatient Prescriptions  Medication Sig Dispense Refill  . acetaminophen (TYLENOL) 500 MG tablet Take 500 mg by mouth every 6 (six) hours as needed for moderate pain or headache.    . ALPRAZolam (XANAX) 0.25 MG tablet Take 1 tablet (0.25 mg total) by mouth 2 (two) times daily as needed for anxiety (palpitations). 60 tablet 1  . aspirin EC 81 MG tablet Take 1 tablet (81 mg total) by mouth daily. 90 tablet 3  . butalbital-acetaminophen-caffeine (FIORICET) 50-325-40 MG tablet Take 0.5-1 tablets by mouth every 6 (six) hours as needed for headache. 60 tablet 1  . calcium carbonate (OSCAL) 1500 (600 Ca) MG TABS tablet Take 600 mg of elemental calcium by mouth daily.    . calcium carbonate (TUMS) 500 MG chewable tablet Chew 1 tablet (200 mg of elemental calcium total) by mouth daily. 100 tablet 3  . Cholecalciferol (VITAMIN D) 1000 UNITS capsule Take 1,000 Units by mouth daily.      . Estradiol 10 MCG TABS Place 1 tablet vaginally 2 (two) times a week.     . fluticasone (FLONASE) 50 MCG/ACT nasal spray Place  2 sprays into the nose as needed for allergies or rhinitis.     . fluticasone (FLONASE) 50 MCG/ACT nasal spray Place 2 sprays into both nostrils as directed.    Marland Kitchen ibuprofen (ADVIL,MOTRIN) 600 MG tablet Take 1 tablet (600 mg total) by mouth every 8 (eight) hours as needed. 60 tablet 0  . Lactobacillus Rhamnosus, GG, (CULTURELLE PO) Take 1 tablet by mouth daily.    Marland Kitchen loratadine (CLARITIN) 10 MG tablet Take 10 mg by mouth as directed.    . metoprolol tartrate (LOPRESSOR) 25 MG tablet Take 0.5 tablets (12.5 mg total) by mouth daily as needed (palpitations). 30 tablet 3  . Multiple Vitamins-Minerals (CENTRUM SILVER PO) Take 1 tablet by mouth 3 (three) times a week. Take 1 tablet by mouth twice a week    . omeprazole (PRILOSEC) 20 MG capsule Take 20 mg by mouth daily as needed (  acid reflux).     . ranitidine (ZANTAC) 150 MG tablet Take 150 mg by mouth daily as needed for heartburn.    . tobramycin-dexamethasone (TOBRADEX) ophthalmic solution Place 1 drop into the right ear as directed.      No current facility-administered medications for this visit.     Allergies as of 01/17/2016 - Review Complete 01/17/2016  Allergen Reaction Noted  . Amoxicillin-pot clavulanate Diarrhea   . Avelox [moxifloxacin hcl in nacl] Nausea Only 11/25/2012  . Bactrim [sulfamethoxazole-trimethoprim] Other (See Comments)   . Benzonatate Other (See Comments) 10/20/2009  . Ciprofloxacin Rash 03/20/2015  . Sulfonamide derivatives Palpitations and Other (See Comments)     Vitals: BP 112/68 (BP Location: Right Arm, Patient Position: Sitting, Cuff Size: Normal)   Pulse 90   Ht _0  (1.575 m)   Wt 101 lb 3.2 oz (45.9 kg)   BMI 18.51 kg/m  Last Weight:  Wt Readings from Last 1 Encounters:  01/17/16 101 lb 3.2 oz (45.9 kg)   Last Height:   Ht Readings from Last 1 Encounters:  01/17/16 _1  (1.575 m)    Physical exam: Exam: Gen: NAD, conversant, well nourised, well groomed                     CV: RRR, no MRG. No  Carotid Bruits. No peripheral edema, warm, nontender Eyes: Conjunctivae clear without exudates or hemorrhage  Neuro: Detailed Neurologic Exam  Speech:    Speech is normal; fluent and spontaneous with normal comprehension.  Cognition:    The patient is oriented to person, place, and time;     recent and remote memory intact;     language fluent;     normal attention, concentration,     fund of knowledge Cranial Nerves:    The pupils are equal, round, and reactive to light. The fundi are normal and spontaneous venous pulsations are present. Visual fields are full to finger confrontation. Extraocular movements are intact. Trigeminal sensation is intact and the muscles of mastication are normal. The face is symmetric. The palate elevates in the midline. Hearing intact. Voice is normal. Shoulder shrug is normal. The tongue has normal motion without fasciculations.   Coordination:    Normal finger to nose and heel to shin. Normal rapid alternating movements.   Gait:    Heel-toe and tandem gait are normal.   Motor Observation:    No asymmetry, no atrophy, and no involuntary movements noted. Tone:    Normal muscle tone.    Posture:    Posture is normal. normal erect    Strength:    Strength is V/V in the upper and lower limbs.      Sensation: intact to LT     Reflex Exam:  DTR's:    Deep tendon reflexes in the upper and lower extremities are normal bilaterally.   Toes:    The toes are downgoing bilaterally.   Clonus:    Clonus is absent.      Assessment/Plan:  This is a lovely 70 year old female with chronic disequilibrium syndrome significantly affecting her life. She has hearing loss, chronic dizziness, past vertigo, right-sided hearing loss,  pulsatile tinnitus, vertigo. Patient reports pulsatile tinnitus and hearing loss in the right ear with a FHx of vascular malformations. Need to evaluate for other causes of disequilibrium and dizziness and hearing loss. Also concerned  for her hearing changes and family history of vascular malformations. No history of migraines so unlikely vestibular migraines. Neuro exam is  nonfocal however still feel that MRI of the brain is warranted to evaluate for causes such as schwannoma, given acute onset also stroke stroke or other intracranial abnormalities causing her symptoms.    Dizziness, vertigo, disequilibrium: - need to rule out central cause of vertigo/dizziness or a vestibular schwannoma, cerebellar infarct and for pathologies affecting the brainstem or vestibular nerve.   MRA to detect stenosis or occlusion of the posterior circulation, aneurysm or vascular malformations especially given her family history. - No signs or symptoms to suggest other disorders that can cause disequilibrium such as Parkinson's disease, peripheral neuropathy, orthostatic hypotension, visual impairment, neuromuscular disorder. - Will refer to Vestibular therapy  CC: Vicie Mutters, MD  Sarina Ill, MD  [facility] [address] [place], [place] [address]  Phone [phone] Fax [phone]

## 2016-01-23 ENCOUNTER — Encounter: Payer: Self-pay | Admitting: Neurology

## 2016-01-23 NOTE — Telephone Encounter (Signed)
Called patient back. Relayed per Dr Jaynee Eagles that there is a very low risk. She still recommends she gets testing done. Patient scheduled in October for MRI. She will call back if she has any further questions.

## 2016-01-24 ENCOUNTER — Encounter: Payer: Self-pay | Admitting: Internal Medicine

## 2016-01-28 ENCOUNTER — Telehealth: Payer: Self-pay | Admitting: Neurology

## 2016-01-28 ENCOUNTER — Encounter: Payer: Self-pay | Admitting: Neurology

## 2016-01-28 DIAGNOSIS — R42 Dizziness and giddiness: Secondary | ICD-10-CM | POA: Insufficient documentation

## 2016-01-28 NOTE — Telephone Encounter (Signed)
Patient's note is complete, do we need anything to get her imaging approved? thanks

## 2016-02-01 ENCOUNTER — Ambulatory Visit (INDEPENDENT_AMBULATORY_CARE_PROVIDER_SITE_OTHER): Payer: Medicare Other | Admitting: Emergency Medicine

## 2016-02-01 ENCOUNTER — Encounter: Payer: Self-pay | Admitting: Emergency Medicine

## 2016-02-01 DIAGNOSIS — Z23 Encounter for immunization: Secondary | ICD-10-CM

## 2016-02-01 DIAGNOSIS — J479 Bronchiectasis, uncomplicated: Secondary | ICD-10-CM | POA: Diagnosis not present

## 2016-02-01 DIAGNOSIS — J301 Allergic rhinitis due to pollen: Secondary | ICD-10-CM

## 2016-02-01 NOTE — Telephone Encounter (Signed)
I discussed with her in office. No sputum production. Will continue to follow, consider FOB for BAL and AFB

## 2016-02-01 NOTE — Assessment & Plan Note (Addendum)
She may be colonized with mycobacterial disease. Her symptoms are somewhat better. I think we can defer bronchoscopy at this time. Certainly if she develops sputum production I would like to sample it and send it for culture and smears. Pain on the pattern of her cough and progression of her bronchiectasis on CT scan we may yet decide to perform bronchoscopy and BAL  Your CT scan shows bronchiectasis with slightly more inflammation. Depending on your symptoms and cough we may decide to revisit bronchoscopy.  Follow with Dr Lamonte Sakai in 6 months or sooner if you have any problems

## 2016-02-01 NOTE — Progress Notes (Signed)
Subjective:    Patient ID: Monica [name], female    DOB: 08-29-1945, 70 y.o.   MRN: [id number]  HPI Ms. Certo is a 70 year old never smoker with a history of allergic rhinitis, GERD,urticaria. She has been followed in our office by Dr. Joya Gaskins for bronchiectasis. Her last CT scan of the chest was performed in 2011 which a person reviewed. This shows probably right middle lobe bronchiectatic changes. There are some micronodular disease could be consistent with atypical mycobacterial infection although this is never been documented. She was originally dx in setting recurrent bronchitis / PNA's. Had a bronchoscopy in 2003 - cx's negative. She has flared occasionally, last time was 2 yrs ago. She has been on scheduled BD's before, none currently, none for several years.   She does have more chronic sinus disease - does NSW reliably, flonase during allergy season, zyrtec during allergy season. GERD on prilosec, zantac prn.  Her ENT is Dr Thornell Mule, remote sinus sgy. She is treated with abx fairly frequently.   Denies any current cough, dyspnea. She is active, exercises.   Acute OV 01/02/16 -- This is an acute office visit today. Patient has a history of significant bronchiectasis, chronic cough with allergic rhinitis and GERD. Also since last visit she has been diagnosed with Paroxysmal atrial fibrillation. She had some episodes of increased GERD and also some increased rhinitis last week. She is coughing more during the day, mostly clear mucous. She underwent PFT on 8/7 that I have reviewed > shows normal airflows, normal volumes and diffusion. She restarted her prilosec and zantac and is currently taking. She is uses flonase prn, not using regularly right now. She is due for a CT chest soon. She does not use BD's  ROV 02/01/16 -- This is a follow-up visit for patient with bronchiectasis and an increase in her coughing over the last 2 months. At her last visit one month ago we restarted his nasal steroid,  briefly increased her omeprazole to every day. She underwent a CT scan of the chest on 01/10/16 that I personally reviewed. This shows slight increase in his plugging in setting of bronchiectasis and some scattered stable pulmonary nodules. Increased volume loss in the right middle lobe. She states that her cough has decreased some compared with last visit. Non-productive.   Review of Systems As per history of present illness  Past Medical History:  Diagnosis Date  . Adenomatous colon polyp 10/2003  . Allergic rhinitis   . Allergy    SEASONAL  . Bronchiectasis   . Chronic sinusitis   . Diverticulosis   . GERD (gastroesophageal reflux disease)   . Hearing loss   . Hemorrhoids   . Insomnia   . Menopausal disorder   . Osteoporosis   . Palpitations   . TMJ syndrome   . Urticaria      Family History  Problem Relation Age of Onset  . Rectal cancer Mother     died age 78  . Seizures Mother   . Colon cancer Mother 92  . Cancer Mother   . Heart disease Father     dies age 88  . Hypertension Father   . Heart disease Maternal Grandfather   . AVM Brother   . Cancer Maternal Grandmother   . Cancer Paternal Uncle     multiple uncles with cancer     Social History   Social History  . Marital status: Married    Spouse name: [name]  . Number of children: 0  .  Years of education: BSN   Occupational History  . retired Therapist, sports    Social History Main Topics  . Smoking status: Never Smoker  . Smokeless tobacco: Never Used  . Alcohol use 1.2 oz/week    2 Glasses of wine per week  . Drug use: No  . Sexual activity: Not on file   Other Topics Concern  . 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   Allergies  Allergen Reactions  . Amoxicillin-Pot Clavulanate Diarrhea    Has patient had a PCN reaction causing  immediate rash, facial/tongue/throat swelling, SOB or lightheadedness with hypotension:No Has patient had a PCN reaction causing severe rash involving mucus membranes or skin necrosis:No Has patient had a PCN reaction that required hospitalization:No Has patient had a PCN reaction occurring within the last 10 years:Yes If all of the above answers are "NO", then may proceed with Cephalosporin use  . Avelox [Moxifloxacin Hcl In Nacl] Nausea Only  . Bactrim [Sulfamethoxazole-Trimethoprim] Other (See Comments)    flushing  . Benzonatate Other (See Comments)    felt bad, out of sorts, disoriented.  . Ciprofloxacin Rash    Rash across abdomen  . Sulfonamide Derivatives Palpitations and Other (See Comments)    Felt flushed     Outpatient Medications Prior to Visit  Medication Sig Dispense Refill  . acetaminophen (TYLENOL) 500 MG tablet Take 500 mg by mouth every 6 (six) hours as needed for moderate pain or headache.    . ALPRAZolam (XANAX) 0.25 MG tablet Take 1 tablet (0.25 mg total) by mouth 2 (two) times daily as needed for anxiety (palpitations). 60 tablet 1  . aspirin EC 81 MG tablet Take 1 tablet (81 mg total) by mouth daily. 90 tablet 3  . butalbital-acetaminophen-caffeine (FIORICET) 50-325-40 MG tablet Take 0.5-1 tablets by mouth every 6 (six) hours as needed for headache. 60 tablet 1  . calcium carbonate (OSCAL) 1500 (600 Ca) MG TABS tablet Take 600 mg of elemental calcium by mouth daily.    . calcium carbonate (TUMS) 500 MG chewable tablet Chew 1 tablet (200 mg of elemental calcium total) by mouth daily. 100 tablet 3  . Cholecalciferol (VITAMIN D) 1000 UNITS capsule Take 1,000 Units by mouth daily.      . Estradiol 10 MCG TABS Place 1 tablet vaginally 2 (two) times a week.     . fluticasone (FLONASE) 50 MCG/ACT nasal spray Place 2 sprays into the nose as needed for allergies or rhinitis.     . fluticasone (FLONASE) 50 MCG/ACT nasal spray Place 2 sprays into both nostrils as directed.      Marland Kitchen ibuprofen (ADVIL,MOTRIN) 600 MG tablet Take 1 tablet (600 mg total) by mouth every 8 (eight) hours as needed. 60 tablet 0  . Lactobacillus Rhamnosus, GG, (CULTURELLE PO) Take 1 tablet by mouth daily.    Marland Kitchen loratadine (CLARITIN) 10 MG tablet Take 10 mg by mouth as directed.    . metoprolol tartrate (LOPRESSOR) 25 MG tablet Take 0.5 tablets (12.5 mg total) by mouth daily as needed (palpitations). 30 tablet 3  . Multiple Vitamins-Minerals (CENTRUM SILVER PO) Take 1 tablet by mouth 3 (three) times a week. Take 1 tablet by mouth twice a week    . omeprazole (PRILOSEC) 20 MG capsule Take 20 mg by mouth daily  as needed (acid reflux).     . ranitidine (ZANTAC) 150 MG tablet Take 150 mg by mouth daily as needed for heartburn.    . tobramycin-dexamethasone (TOBRADEX) ophthalmic solution Place 1 drop into the right ear as directed.      No facility-administered medications prior to visit.          Objective:   Physical Exam Vitals:   02/01/16 1007  BP: 118/70  Pulse: 80  SpO2: 97%  Weight: 100 lb (45.4 kg)  Height: 5\' 2"  (1.575 m)   'Gen: Pleasant, thin woman, well-appearing, in no distress,  normal affect  ENT: No lesions,  mouth clear,  oropharynx clear, no postnasal drip  Neck: No JVD, no TMG, no carotid bruits  Lungs: No use of accessory muscles, clear without rales or rhonchi, no wheeze on a forced expiration.   Cardiovascular: RRR, heart sounds normal, no murmur or gallops, no peripheral edema  Musculoskeletal: No deformities, no cyanosis or clubbing  Neuro: alert, non focal  Skin: Warm, no lesions or rashes   01/10/16 --  COMPARISON:  11/02/2009  FINDINGS: Cardiovascular: Aorta normal caliber with minimal atherosclerotic calcification. No pericardial effusion.  Mediastinum/Nodes: Few normal sized mediastinal lymph nodes. No thoracic adenopathy. Esophagus unremarkable. Base of cervical region significant only for a 5 mm RIGHT thyroid nodule.  Lungs/Pleura:  Scarring, collapse, and granulomatous calcifications within the medial segment RIGHT middle lobe associated with bronchiectasis. Minimal scattered peribronchovascular slightly nodular infiltrates in both lobes favor chronic infection. 4 mm LEFT lower lobe nodule image 126, suspect slightly more anteriorly positioned previous exam mild posterior due to infiltrate volume loss in LEFT lower lobe. Minimal granulomatous change lingula with a 4 mm calcified granuloma image 73. Mild cylindrical bronchiectasis identified throughout both lungs. Scattered foci of mucous plugging in LEFT lower lobe. No segmental airspace consolidation, pleural effusion or pneumothorax. Mild scarring at apices bilaterally, stable. Subpleural nodule versus adjacent tiny nodules RIGHT lobe image 74 overall unchanged. Scattered calcified granulomata RIGHT lung. 7 mm subpleural nodule posterior RIGHT lower lobe image 91 stable. Underlying diffuse hyperinflation.  Upper Abdomen: Diverticula at splenic flexure of colon. 5 mm low-attenuation focus lateral segment LEFT lobe liver image 132 stable. Remaining visualized upper abdomen unremarkable  Musculoskeletal: No acute osseous findings.  IMPRESSION: Hyperinflated lungs with old granulomatous disease changes, bronchiectasis, scattered mucous plugging, and scattered stable pulmonary nodules.  Volume loss and scarring in RIGHT middle lobe increased since previous exam.  Scattered mild slightly nodular peribronchovascular infiltrates in both lungs, nonspecific but suggesting chronic infection question MAC.  Colonic diverticulosis at splenic flexure of colon.  Aortic atherosclerosis        Assessment & Plan:  Bronchiectasis without acute exacerbation She may be colonized with mycobacterial disease. Her symptoms are somewhat better. I think we can defer bronchoscopy at this time. Certainly if she develops sputum production I would like to sample it and  send it for culture and smears. Pain on the pattern of her cough and progression of her bronchiectasis on CT scan we may yet decide to perform bronchoscopy and BAL  Your CT scan shows bronchiectasis with slightly more inflammation. Depending on your symptoms and cough we may decide to revisit bronchoscopy.  Follow with Dr Lamonte Sakai in 6 months or sooner if you have any problems  ALLERGIC RHINITIS, SEASONAL We will continue your current medications as you are taking them   Baltazar Apo, MD, PhD 02/01/2016, 10:35 AM [facility] [phone] or if no answer [phone]

## 2016-02-01 NOTE — Assessment & Plan Note (Signed)
We will continue your current medications as you are taking them

## 2016-02-01 NOTE — Patient Instructions (Addendum)
We will continue your current medications as you are taking them  Your CT scan shows bronchiectasis with slightly more inflammation. Depending on your symptoms and cough we may decide to revisit bronchoscopy.  Follow with Dr Lamonte Sakai in 6 months or sooner if you have any problems

## 2016-02-05 ENCOUNTER — Ambulatory Visit
Admission: RE | Admit: 2016-02-05 | Discharge: 2016-02-05 | Disposition: A | Discharge status: Home / Self Care | Payer: Medicare Other | Source: Ambulatory Visit | Attending: Neurology | Admitting: Neurology

## 2016-02-05 DIAGNOSIS — G450 Vertebro-basilar artery syndrome: Secondary | ICD-10-CM

## 2016-02-05 DIAGNOSIS — R42 Dizziness and giddiness: Secondary | ICD-10-CM

## 2016-02-05 DIAGNOSIS — H832X9 Labyrinthine dysfunction, unspecified ear: Secondary | ICD-10-CM

## 2016-02-05 DIAGNOSIS — R51 Headache: Secondary | ICD-10-CM

## 2016-02-05 DIAGNOSIS — R519 Headache, unspecified: Secondary | ICD-10-CM

## 2016-02-05 MED ORDER — GADOBENATE DIMEGLUMINE 529 MG/ML IV SOLN
9.0000 mL | Freq: Once | INTRAVENOUS | Status: AC | PRN
  Administered 2016-02-05: 9 mL via INTRAVENOUS

## 2016-02-06 ENCOUNTER — Telehealth: Payer: Self-pay | Admitting: *Deleted

## 2016-02-06 NOTE — Telephone Encounter (Signed)
-----   Message from Melvenia Beam, MD sent at 02/05/2016  6:45 PM EDT ----- MRI of the brain and MRA of the head are both normal for age thanks

## 2016-02-06 NOTE — Telephone Encounter (Signed)
Called and spoke to pt about normal MRI brian and MRA head for her age per Dr Jaynee Eagles. Pt verbalized understanding. She has no further questions at this time. She stated she is scheduled next week for vestibular therapy eval.

## 2016-02-14 ENCOUNTER — Ambulatory Visit: Payer: Medicare Other | Attending: Neurology | Admitting: Rehabilitative and Restorative Service Providers"

## 2016-02-14 DIAGNOSIS — R42 Dizziness and giddiness: Secondary | ICD-10-CM | POA: Insufficient documentation

## 2016-02-14 NOTE — Patient Instructions (Signed)
Gaze Stabilization: Tip Card 1.Target must remain in focus, not blurry, and appear stationary while head is in motion. 2.Perform exercises with small head movements (45 to either side of midline). 3.Increase speed of head motion so long as target is in focus. 4.If you wear eyeglasses, be sure you can see target through lens (therapist will give specific instructions for bifocal / progressive lenses). 5.These exercises may provoke dizziness or nausea. Work through these symptoms. If too dizzy, slow head movement slightly. Rest between each exercise. 6.Exercises demand concentration; avoid distractions. 7.For safety, perform standing exercises close to a counter, wall, corner, or next to someone.  Copyright  VHI. All rights reserved.  Gaze Stabilization: Standing Feet Apart   Feet shoulder width apart, keeping eyes on target on wall 3 feet away, tilt head down slightly and move head side to side for 30-60 seconds.  Do 2 sessions per day.   Copyright  VHI. All rights reserved.

## 2016-02-15 NOTE — Therapy (Signed)
[facility] [address] [place], [place], [address] Phone: [phone]   Fax:  [phone]  Physical Therapy Evaluation  Patient Details  Name: Monica [name] MRN: [id number] Date of Birth: Jun 12, 1945 Referring Provider: Heide Spark, MD  Encounter Date: 02/14/2016      PT End of Session - 02/14/16 3716    Visit Number 1   Number of Visits 3   Date for PT Re-Evaluation 03/30/16   Authorization Type G code every 10 visits.   PT Start Time 1235   PT Stop Time 1315   PT Time Calculation (min) 40 min   Activity Tolerance Patient tolerated treatment well   Behavior During Therapy WFL for tasks assessed/performed      Past Medical History:  Diagnosis Date  . Adenomatous colon polyp 10/2003  . Allergic rhinitis   . Allergy    SEASONAL  . Bronchiectasis   . Chronic sinusitis   . Diverticulosis   . GERD (gastroesophageal reflux disease)   . Hearing loss   . Hemorrhoids   . Insomnia   . Menopausal disorder   . Osteoporosis   . Palpitations   . TMJ syndrome   . Urticaria     Past Surgical History:  Procedure Laterality Date  . COLONOSCOPY    . NASAL SINUS SURGERY    . TYMPANOSTOMY      There were no vitals filed for this visit.       Subjective Assessment - 02/14/16 1236    Subjective The patient had onset of R mastoiditis 11/2014.  6 weeks into condition, she had onset of vertigo/spinning sensation that lasted short term.  She reports long term sensation of heaviness in her head, occasional headache,  and noticing some discomfort with horizontal head motion.  She does tai chi and balance exercises regularly.     Patient Stated Goals Get sensation in head resolved.    Currently in Pain? No/denies            [facility] PT Assessment - 02/14/16 1242      Assessment   Medical Diagnosis vertigo   Referring Provider Heide Spark, MD   Onset Date/Surgical Date --  11/2014   Prior Therapy none, self  treats with tai chi     Precautions   Precautions None     Restrictions   Weight Bearing Restrictions No     Balance Screen   Has the patient fallen in the past 6 months No   Has the patient had a decrease in activity level because of a fear of falling?  No   Is the patient reluctant to leave their home because of a fear of falling?  No     Home Ecologist residence   Living Arrangements Spouse/significant other  caregiver for spouse   Type of [place] to enter   Entrance Stairs-Number of Steps 2   Entrance Stairs-Rails --  grabs support structure   Home Layout Two level   Alternate Level Stairs-Number of Steps 16   Alternate Level Stairs-Rails Right     Prior Function   Level of Independence Independent   Leisure Cares for husband     Cognition   Overall Cognitive Status Within Functional Limits for tasks assessed     Observation/Other Assessments   Focus on Therapeutic Outcomes (FOTO)  94%   Dizziness Handicap Inventory (DHI)  4%     Standardized Balance Assessment  Standardized Balance Assessment Dynamic Gait Index;Balance Master Runner, broadcasting/film/video Organization Test     Dynamic Gait Index   Level Surface Normal   Change in Gait Speed Normal   Gait with Horizontal Head Turns Mild Impairment   Gait with Vertical Head Turns Mild Impairment   Gait and Pivot Turn Normal   Step Over Obstacle Normal   Step Around Obstacles Normal   Steps Normal   Total Score 22   DGI comment: 22/24 indicating low fall risk.     High Level Balance   High Level Balance Comments Sensory organization test = 74% compared to age/height orms of 65%.  Patient WNLs for use of all sensory systems for balance.             Vestibular Assessment - 02/14/16 1246      Vestibular Assessment   General Observation Walks independently into clinic without device at normal pace     Symptom Behavior   Type of  Dizziness --  Heaviness in head   Frequency of Dizziness constant, underlying   Duration of Dizziness constant   Aggravating Factors Turning head quickly   Relieving Factors Head stationary     Occulomotor Exam   Occulomotor Alignment Normal   Spontaneous Absent   Gaze-induced Absent   Smooth Pursuits Saccades  mild catch up saccade noted R/L field with vertical mvmt   Saccades Intact     Vestibulo-Occular Reflex   VOR 1 Head Only (x 1 viewing) WNLs at self regulated pace   VOR Cancellation Normal   Comment head impulse test positive for mild refixation saccade R > L.       Visual Acuity   Dynamic 4 line difference in SVA vs DVA indicating deficit (within 2 lines is normal)     Positional Testing   Sidelying Test Sidelying Right;Sidelying Left   Horizontal Canal Testing Horizontal Canal Right;Horizontal Canal Left     Sidelying Right   Sidelying Right Duration n/a   Sidelying Right Symptoms No nystagmus     Sidelying Left   Sidelying Left Duration n/a   Sidelying Left Symptoms No nystagmus     Horizontal Canal Right   Horizontal Canal Right Duration n/a   Horizontal Canal Right Symptoms Normal     Horizontal Canal Left   Horizontal Canal Left Duration n/a   Horizontal Canal Left Symptoms Normal                       PT Education - 02/14/16 1316    Education provided Yes   Education Details HEP: Gaze x 1 viewing standing   Person(s) Educated Patient   Methods Explanation;Demonstration;Handout   Comprehension Verbalized understanding;Returned demonstration             PT Long Term Goals - 02/14/16 1317      PT LONG TERM GOAL #1   Title The patient will be indep with gaze x 1 viewing for HEP.   Baseline Target date 03/16/2016   Time 4   Period Weeks     PT LONG TERM GOAL #2   Title The patient will improve SVA vs DVA to 3 line or less difference.   Baseline Target date 03/17/19   Time 4   Period Weeks               Plan -  02/14/16 1303    Clinical Impression Statement The patient is an active 70 year old female  with onset of general heavy headed/ dizzy sensation after R mastoiditis.  She reports initial imbalance that has improved with her performing tai chi in the community.   At today's evaluation, the patient has diminished VOR per positive bilateral head impulse test and abnormal SVA vs DVA.  Other testing WNLs (except mild catch up saccade noted during smooth pursuits vertical plane when eyes in 30 degrees from midline position.--has normal imaging).     Rehab Potential Good   PT Frequency 1x / week   PT Duration 3 weeks   PT Treatment/Interventions ADLs/Self Care Home Management;Vestibular;Neuromuscular re-education;Patient/family education;Gait training   Consulted and Agree with Plan of Care Patient      Patient will benefit from skilled therapeutic intervention in order to improve the following deficits and impairments:  Dizziness  Visit Diagnosis: Dizziness and giddiness      G-Codes - 05-Mar-2016 0924    Functional Assessment Tool Used Dizziness/ heavy headed sensation x >12 months   Functional Limitation Self care   Self Care Current Status (O2518) At least 20 percent but less than 40 percent impaired, limited or restricted   Self Care Goal Status (F8421) At least 1 percent but less than 20 percent impaired, limited or restricted       Problem List Patient Active Problem List   Diagnosis Date Noted  . Dizziness 01/28/2016  . Headache 09/29/2015  . PAF (paroxysmal atrial fibrillation) ([place]) 09/19/2015  . Allergic reaction 03/22/2015  . Mastoiditis of right side 03/13/2015  . Chest pain, atypical 04/13/2014  . Healthcare maintenance 02/16/2013  . Elevated CA-125 02/16/2013  . IBS (irritable bowel syndrome) 11/25/2010  . ANXIETY DISORDER, SITUATIONAL, MILD 10/20/2009  . ALLERGIC RHINITIS, SEASONAL 04/23/2007  . GERD 03/10/2007  . Insomnia due to stress 01/26/2007  . HEARING LOSS, LEFT  EAR 01/26/2007  . Sinusitis, chronic 01/26/2007  . Bronchiectasis without acute exacerbation ([facility]) 01/26/2007  . TMJ SYNDROME 01/26/2007  . MENOPAUSAL DISORDER 01/26/2007  . OSTEOPOROSIS 01/26/2007  . PALPITATIONS 01/26/2007    Kamaiya Antilla, PT 05-Mar-2016, 9:24 AM  [facility] [address] [place], [place], [address] Phone: [phone]   Fax:  [phone]  Name: Monica [name] MRN: [id number] Date of Birth: 1945-11-04

## 2016-02-19 ENCOUNTER — Telehealth: Payer: Self-pay | Admitting: Rehabilitative and Restorative Service Providers"

## 2016-02-19 NOTE — Telephone Encounter (Signed)
The patient notes that she is hearing noises in her neck when doing letter exercise and also has achiness of the right side of her neck.  PT recommended she continue exercise at small amplitude movement and call back to clinic if she has continued pain.

## 2016-02-28 ENCOUNTER — Telehealth: Payer: Self-pay | Admitting: Rehabilitative and Restorative Service Providers"

## 2016-02-28 NOTE — Telephone Encounter (Signed)
Patient called to c/o neck pain related to exercises.  PT recommended she hold the exercises until next PT visit next week due to discomfort.

## 2016-03-06 ENCOUNTER — Ambulatory Visit: Payer: Medicare Other | Attending: Neurology | Admitting: Rehabilitative and Restorative Service Providers"

## 2016-03-06 DIAGNOSIS — R42 Dizziness and giddiness: Secondary | ICD-10-CM | POA: Diagnosis not present

## 2016-03-06 NOTE — Patient Instructions (Signed)
  Gaze Stabilization: Tip Card 1.Target must remain in focus, not blurry, and appear stationary while head is in motion. 2.Perform exercises with small head movements (45 to either side of midline). 3.Increase speed of head motion so long as target is in focus. 4.If you wear eyeglasses, be sure you can see target through lens (therapist will give specific instructions for bifocal / progressive lenses). 5.These exercises may provoke dizziness or nausea. Work through these symptoms. If too dizzy, slow head movement slightly. Rest between each exercise. 6.Exercises demand concentration; avoid distractions. 7.For safety, perform standing exercises close to a counter, wall, corner, or next to someone.  Copyright  VHI. All rights reserved.  Gaze Stabilization: Sitting     Keeping eyes on target on wall 3 feet away, tilt head down slightly and move head side to side for 20 seconds.   *Stay within 20 degrees  Do 1 sessions per day.  *If you tolerate this exercise (from a neck standpoint), increase # of times per day to 2x/day while remaining at 20 seconds.  After a week, if you still tolerate well, increase to 30 seconds, 2x/day.     Copyright  VHI. All rights reserved.  Feet Together (Compliant Surface) Varied Arm Positions - Eyes Closed    Stand on compliant surface: __pillow______ with feet together and arms at your side. Close eyes and visualize upright position. Hold__30__ seconds. Repeat __3__ times per session. Do _2___ sessions per day.  Copyright  VHI. All rights reserved.   Turning in Place: Solid Surface    Standing in place,  Turn quickly (as long as it is safe) full turn to the left.  Rest and let dizziness settle.  Repeat a total of 3 times to the left.   Repeat to the right 3 times. Do __2__ sessions per day.  Copyright  VHI. All rights reserved.

## 2016-03-06 NOTE — Therapy (Signed)
[facility] [address] [place], [place], [address] Phone: [phone]   Fax:  [phone]  Physical Therapy Treatment  Patient Details  Name: Monica [name] MRN: [id number] Date of Birth: 1946-05-03 Referring Provider: Heide Spark, MD  Encounter Date: 03/06/2016      PT End of Session - 03/06/16 1101    Visit Number 2   Number of Visits 3   Date for PT Re-Evaluation 03/30/16   Authorization Type G code every 10 visits.   PT Start Time 1015   PT Stop Time 1045   PT Time Calculation (min) 30 min   Activity Tolerance Patient tolerated treatment well   Behavior During Therapy WFL for tasks assessed/performed      Past Medical History:  Diagnosis Date  . Adenomatous colon polyp 10/2003  . Allergic rhinitis   . Allergy    SEASONAL  . Bronchiectasis   . Chronic sinusitis   . Diverticulosis   . GERD (gastroesophageal reflux disease)   . Hearing loss   . Hemorrhoids   . Insomnia   . Menopausal disorder   . Osteoporosis   . Palpitations   . TMJ syndrome   . Urticaria     Past Surgical History:  Procedure Laterality Date  . COLONOSCOPY    . NASAL SINUS SURGERY    . TYMPANOSTOMY      There were no vitals filed for this visit.      Subjective Assessment - 03/06/16 1021    Subjective The patient reports she has had pain with neck rotation during gaze exercises.  PT spoke with patient last week and recommended she hold off on exercise until we see her in clinic today.  She notes intermittent sensation of dizziness day to day.   Current symptoms:  heaviness in head and off balance.    Patient Stated Goals Get sensation in head resolved.    Currently in Pain? No/denies      THERAPEUTIC EXERCISE: Patient notes she can perform slow, controlled ROM at tai chi, but does not tolerate gaze exercises well. PT had patient demonstrate neck ROM and she has minimal tightness noted for cervical rotation and  sidebending and WNLs flexion/extension Patient notes she felt that she was apprehensive due to sounds of crepitus.  PT discussed that as long as not painful, it is okay to proceed through movement  NEUROMUSCULAR RE-EDUCATION: PT reviewed gaze x 1 adaptation and changed to include: 20 deg ROM from midline 20 seconds (instead of 1 minute) 1x/day (instead of 2) *then discussed progression of activities as patient is able to tolerate this repetition/frequency  Standing balance in corner to increase use of vestibular inputs with feet together on foam with eyes closed  Standing quarter turns progressing to 1/2 and then full turns 3 reps each direction with sensation of head heaviness provoked                         PT Education - 03/06/16 1103    Education provided Yes   Education Details updated HEP: gaze seated, standing on foam + eyes closed, and 360 turns   Person(s) Educated Patient   Methods Explanation;Demonstration;Handout   Comprehension Verbalized understanding;Returned demonstration             PT Long Term Goals - 02/14/16 1317      PT LONG TERM GOAL #1   Title The patient will be indep with gaze x 1 viewing for  HEP.   Baseline Target date 03/16/2016   Time 4   Period Weeks     PT LONG TERM GOAL #2   Title The patient will improve SVA vs DVA to 3 line or less difference.   Baseline Target date 03/17/19   Time 4   Period Weeks               Plan - 03/06/16 1102    Clinical Impression Statement The patient tolerated today's activities well.  She initially felt improvement with gaze adaptation exercise after evaluation, however then noted crepitus in neck, which was discouraging.  PT and patient had discussed holding on the phone until today's visit.  Patient able to tolerate and exericse was modified keeping in mind her concern for neck discomfort/crepitus. Plan to f/u 1 more visit to ensure progressing well.    PT Treatment/Interventions  ADLs/Self Care Home Management;Vestibular;Neuromuscular re-education;Patient/family education;Gait training   PT Next Visit Plan check HEP and discharge (needs cert to cover next visit)   Consulted and Agree with Plan of Care Patient      Patient will benefit from skilled therapeutic intervention in order to improve the following deficits and impairments:  Dizziness  Visit Diagnosis: No diagnosis found.     Problem List Patient Active Problem List   Diagnosis Date Noted  . Dizziness 01/28/2016  . Headache 09/29/2015  . PAF (paroxysmal atrial fibrillation) ([place]) 09/19/2015  . Allergic reaction 03/22/2015  . Mastoiditis of right side 03/13/2015  . Chest pain, atypical 04/13/2014  . Healthcare maintenance 02/16/2013  . Elevated CA-125 02/16/2013  . IBS (irritable bowel syndrome) 11/25/2010  . ANXIETY DISORDER, SITUATIONAL, MILD 10/20/2009  . ALLERGIC RHINITIS, SEASONAL 04/23/2007  . GERD 03/10/2007  . Insomnia due to stress 01/26/2007  . HEARING LOSS, LEFT EAR 01/26/2007  . Sinusitis, chronic 01/26/2007  . Bronchiectasis without acute exacerbation ([place]) 01/26/2007  . TMJ SYNDROME 01/26/2007  . MENOPAUSAL DISORDER 01/26/2007  . OSTEOPOROSIS 01/26/2007  . PALPITATIONS 01/26/2007    Shareena Nusz, PT 03/06/2016, 11:04 AM  [facility] [address] [place], [place], [address] Phone: [phone]   Fax:  [phone]  Name: Monica [name] MRN: [id number] Date of Birth: 1946-05-06

## 2016-03-13 ENCOUNTER — Encounter: Payer: Self-pay | Admitting: Emergency Medicine

## 2016-03-14 ENCOUNTER — Encounter: Payer: Self-pay | Admitting: Emergency Medicine

## 2016-03-14 NOTE — Telephone Encounter (Signed)
email from patient:  I am still dealing with a nonproductive cough since 8/26. I saw you twice for this but the cough is still lingering. It comes at different times of the day, sometimes just a single light cough or it can be continuous as it was for over 2 hours tonight while I was out to dinner. Then my sternal area just aches after a long bout. It seems to be more active in the evening. I have started on some Delsym which helps but that only treats the symptom not the cause. I took another round of Prilosec but that didn't make any difference. Sometimes the coughing is accompanied by mucous at the back of my throat. Please advise as this is becoming a chronic situation. Thank you -----------------  RB please advise on recs.  Thanks!

## 2016-03-15 MED ORDER — ESOMEPRAZOLE MAGNESIUM 40 MG PO CPDR: 40.0000 mg | DELAYED_RELEASE_CAPSULE | Freq: Two times a day (BID) | ORAL | 1 refills | Status: DC

## 2016-03-15 NOTE — Telephone Encounter (Signed)
I think she will need to be seen in office. Before doing so I would have her do 3 weeks of a stronger PPI to see if she gets more benefit than from the prilosec. Please have her take nexium 40mg  bid for 3 weeks, then go to qd. Then have her seen as OV

## 2016-03-15 NOTE — Telephone Encounter (Signed)
I spoke with the pt and notified of recs per RB  She verbalized understanding  Rx was sent to pharm and appt with TP scheduled for 3 wks.

## 2016-03-20 ENCOUNTER — Encounter: Payer: Self-pay | Admitting: Emergency Medicine

## 2016-03-20 NOTE — Telephone Encounter (Signed)
Per the 11.8.17 phone note, pt's PPI was increased to Nexium 40mg  BID x3 weeks, then 40mg  QD  [facility] Dt and spoke with Raquel Sarna who reported Nexium is covered, the PA is for the quantity.  We can do PA tomorrow: ID [id number] [phone]

## 2016-03-21 NOTE — Telephone Encounter (Signed)
Called number provided and was on hold over 5 min  WCB  Will advise pt fine to take med otc like she has been doing since she emailed asking about it

## 2016-03-27 ENCOUNTER — Ambulatory Visit: Payer: Medicare Other | Admitting: Rehabilitative and Restorative Service Providers"

## 2016-03-27 DIAGNOSIS — R42 Dizziness and giddiness: Secondary | ICD-10-CM | POA: Diagnosis not present

## 2016-03-27 NOTE — Patient Instructions (Signed)
Feet Together (Compliant Surface) Varied Arm Positions - Eyes Closed    Stand on compliant surface: __couch cushion or 2 pillows_ with feet together and arms at your side. Close eyes and visualize upright position. Hold__30__ seconds. Repeat __3__ times per session. Do _2___ sessions per day.  Copyright  VHI. All rights reserved.   Gaze Stabilization - Tip Card  1.Target must remain in focus, not blurry, and appear stationary while head is in motion. 2.Perform exercises with small head movements (45 to either side of midline). 3.Increase speed of head motion so long as target is in focus. 4.If you wear eyeglasses, be sure you can see target through lens (therapist will give specific instructions for bifocal / progressive lenses). 5.These exercises may provoke dizziness or nausea. Work through these symptoms. If too dizzy, slow head movement slightly. Rest between each exercise. 6.Exercises demand concentration; avoid distractions. 7.For safety, perform standing exercises close to a counter, wall, corner, or next to someone.  Copyright  VHI. All rights reserved.   Gaze Stabilization - Standing Feet Apart   Feet shoulder width apart, keeping eyes on target on wall 3 feet away, tilt head down slightly and move head side to side for 60 seconds. Do 2 sessions per day.   Copyright  VHI. All rights reserved.   WAYS TO PROGRESS LETTER EXERCISE:  1) Can increase speed working up until you notice letter shift and then slow down slightly to refixate on letter. 2) BALANCE:  Can move from feet apart, to feet together, then one foot ahead of the other to narrow your base of support. 3) ADD BUSY BACKGROUND:  Can put wrapping paper or geometric (like checkerboard) pattern behind letter. 4) DISTANCE:  Increase distance from target from 3 ft up to 5-7 ft.  Only change one variable at a time and go back to 30 seconds and wider base of support (when changing distance or adding busy  background).

## 2016-03-27 NOTE — Therapy (Signed)
[facility] [address] [place], [place], [address] Phone: [phone]   Fax:  [phone]  Physical Therapy Treatment and Discharge Summary  Patient Details  Name: Monica [name] MRN: [id number] Date of Birth: [birth year] Referring Provider: Heide Spark, MD  Encounter Date: 03/27/2016      PT End of Session - 03/27/16 1327    Visit Number 3   Number of Visits 3   Date for PT Re-Evaluation 03/30/16   Authorization Type G code every 10 visits.   PT Start Time 1324   PT Stop Time 1348   PT Time Calculation (min) 24 min   Activity Tolerance Patient tolerated treatment well   Behavior During Therapy WFL for tasks assessed/performed      Past Medical History:  Diagnosis Date  . Adenomatous colon polyp 10/2003  . Allergic rhinitis   . Allergy    SEASONAL  . Bronchiectasis   . Chronic sinusitis   . Diverticulosis   . GERD (gastroesophageal reflux disease)   . Hearing loss   . Hemorrhoids   . Insomnia   . Menopausal disorder   . Osteoporosis   . Palpitations   . TMJ syndrome   . Urticaria     Past Surgical History:  Procedure Laterality Date  . COLONOSCOPY    . NASAL SINUS SURGERY    . TYMPANOSTOMY      There were no vitals filed for this visit.      Subjective Assessment - 03/27/16 1324    Subjective The patient notes that symptoms are overall improving, however she still notes general "heaviness" in her head and states "it's just going to take time to get better".  She denies dizziness.  She has heaviness daily occurring intermitently and times which she feels things are almost gone.   The exercises are no longer hurting her neck, and she has increased intensity of exercise.     Patient Stated Goals Get sensation in head resolved.    Currently in Pain? No/denies     NEUROMUSCULAR RE-EDUCATION: Reviewed HEP including gaze x 1 trying on blank and busy background with written instructions on ways  to progress  SVA vs DVA is a 4 line difference with visual blurring noted during quick head motion   Corner balance HEP of compliant standing on 1, then 2 pillows with eyes closed and then progressing to foam with eyes closed Made recommendations for home to shift to wider base of support when changing surfaces  SELF CARE/HOME MANAGEMENT: Discussed continued progression of HEP and particularly gaze activities with written instructions Discussed continuing current community wellness program for balance/mobility.       PT Education - 03/27/16 1411    Education provided Yes   Education Details Modified HEP changing compliant surface, and progressing gaze exercises.   Person(s) Educated Patient   Methods Explanation;Demonstration;Handout   Comprehension Verbalized understanding;Returned demonstration             PT Long Term Goals - 03/27/16 1332      PT LONG TERM GOAL #1   Title The patient will be indep with gaze x 1 viewing for HEP.   Baseline Met on 03/27/2016   Time 4   Period Weeks   Status Achieved     PT LONG TERM GOAL #2   Title The patient will improve SVA vs DVA to 3 line or less difference.   Baseline 4 line difference measured on 03/27/2016   Time 4  Period Weeks   Status Not Met               Plan - 03/27/16 1411    Clinical Impression Statement The patient is progressing HEP, however still notes visual jump of target during gaze x 1 adaptation exercises.  PT recommends continuing current HEP and community balance/tai chi.     PT Treatment/Interventions ADLs/Self Care Home Management;Vestibular;Neuromuscular re-education;Patient/family education;Gait training   PT Next Visit Plan Discharge today.   Consulted and Agree with Plan of Care Patient      Patient will benefit from skilled therapeutic intervention in order to improve the following deficits and impairments:  Dizziness  Visit Diagnosis: Dizziness and giddiness     Problem  List Patient Active Problem List   Diagnosis Date Noted  . Dizziness 01/28/2016  . Headache 09/29/2015  . PAF (paroxysmal atrial fibrillation) ([place]) 09/19/2015  . Allergic reaction 03/22/2015  . Mastoiditis of right side 03/13/2015  . Chest pain, atypical 04/13/2014  . Healthcare maintenance 02/16/2013  . Elevated CA-125 02/16/2013  . IBS (irritable bowel syndrome) 11/25/2010  . ANXIETY DISORDER, SITUATIONAL, MILD 10/20/2009  . ALLERGIC RHINITIS, SEASONAL 04/23/2007  . GERD 03/10/2007  . Insomnia due to stress 01/26/2007  . HEARING LOSS, LEFT EAR 01/26/2007  . Sinusitis, chronic 01/26/2007  . Bronchiectasis without acute exacerbation ([place]) 01/26/2007  . TMJ SYNDROME 01/26/2007  . MENOPAUSAL DISORDER 01/26/2007  . OSTEOPOROSIS 01/26/2007  . PALPITATIONS 01/26/2007    PHYSICAL THERAPY DISCHARGE SUMMARY  Visits from Start of Care: see above  Current functional level related to goals / functional outcomes: See above   Remaining deficits: "heaviness" in head intermittently--seems to be slowly improving per subjective report   Education / Equipment: HEP, self progression of activities.   Plan: Patient agrees to discharge.  Patient goals were partially met. Patient is being discharged due to meeting the stated rehab goals.  ?????        Thank you for the referral of this patient. Rudell Cobb, MPT   [place], PT 03/27/2016, 2:13 PM  [facility] [address] [place], [place], [address] Phone: [phone]   Fax:  [phone]  Name: Monica [name] MRN: [id number] Date of Birth: 1945-12-14

## 2016-04-05 ENCOUNTER — Encounter: Payer: Self-pay | Admitting: Adult Health

## 2016-04-05 ENCOUNTER — Ambulatory Visit (INDEPENDENT_AMBULATORY_CARE_PROVIDER_SITE_OTHER): Payer: Medicare Other | Admitting: Adult Health

## 2016-04-05 DIAGNOSIS — J471 Bronchiectasis with (acute) exacerbation: Secondary | ICD-10-CM

## 2016-04-05 MED ORDER — BUDESONIDE-FORMOTEROL FUMARATE 80-4.5 MCG/ACT IN AERO: 2.0000 | INHALATION_SPRAY | Freq: Two times a day (BID) | RESPIRATORY_TRACT | 5 refills | Status: DC

## 2016-04-05 MED ORDER — FLUTTER DEVI: 0 refills | Status: DC

## 2016-04-05 NOTE — Addendum Note (Signed)
Addended by: Doroteo Glassman D on: 04/05/2016 12:39 PM   Modules accepted: Orders

## 2016-04-05 NOTE — Patient Instructions (Signed)
Begin Symbicort 80/4.31mcg 2 puffs Twice daily  , rinse after use Begin Flutter valve Three times a day  .  Mucinex Twice daily  .As needed  Congestion  follow up Dr. Lamonte Sakai  In 6 weeks and As needed

## 2016-04-05 NOTE — Assessment & Plan Note (Signed)
Increased symptom load with daily cough that is nonproductive.  Will add symbicort , flutter valve to see if this helps  Unable to get sputum cx/AFB as no mucus production  May need FOB going forward if not better to check for MAI.   Plan  Patient Instructions  Begin Symbicort 80/4.26mcg 2 puffs Twice daily  , rinse after use Begin Flutter valve Three times a day  .  Mucinex Twice daily  .As needed  Congestion  follow up Dr. Lamonte Sakai  In 6 weeks and As needed

## 2016-04-05 NOTE — Progress Notes (Signed)
Subjective:    Patient ID: Monica [name], female    DOB: 1945-12-13, 70 y.o.   MRN: [id number]  HPI 70 yo female never smoker followed for AR , Urticaria, and Bronchiectasis   TEST  01/2016 CT chest >Hyperinflated lungs with old granulomatous disease changes, bronchiectasis, scattered mucous plugging, and scattered stable pulmonary nodules.Volume loss and scarring in RIGHT middle lobe increased since previous exam.Scattered mild slightly nodular peribronchovascular infiltrates in both lungs, nonspecific but suggesting chronic infection question MAC.  04/05/2016 Follow up : Bronchiectasis /GERD  She returns for 2 month follow up . She has known bronchiectasis and suspected MAI - however symptom load has been okay and Bronchoscopy has been deferred in past .  She says she continues to have a daily cough that at times can be quite severe over the last 3 months.  CT chest in Sept 2017 showed Hyperinflated lungs with old granulomatous disease changes, bronchiectasis, scattered mucous plugging, and scattered stable pulmonary nodules.Volume loss and scarring in RIGHT middle lobe increased since previous exam.Scattered mild slightly nodular peribronchovascular infiltrates in both lungs, nonspecific but suggesting chronic infection question MAC. She was recommended over last month to increase PPI to Twice daily  To see if GERD flare was contributing to cough but did not change with PPI.  She says cough is mainly dry . No fever . She is very active and exercises .  No weight loss . Appetite is good w/ no n/v/d.      Past Medical History:  Diagnosis Date  . Adenomatous colon polyp 10/2003  . Allergic rhinitis   . Allergy    SEASONAL  . Bronchiectasis   . Chronic sinusitis   . Diverticulosis   . GERD (gastroesophageal reflux disease)   . Hearing loss   . Hemorrhoids   . Insomnia   . Menopausal disorder   . Osteoporosis   . Palpitations   . TMJ syndrome   . Urticaria    Current  Outpatient Prescriptions on File Prior to Visit  Medication Sig Dispense Refill  . acetaminophen (TYLENOL) 500 MG tablet Take 500 mg by mouth every 6 (six) hours as needed for moderate pain or headache.    . ALPRAZolam (XANAX) 0.25 MG tablet Take 1 tablet (0.25 mg total) by mouth 2 (two) times daily as needed for anxiety (palpitations). 60 tablet 1  . aspirin EC 81 MG tablet Take 1 tablet (81 mg total) by mouth daily. 90 tablet 3  . calcium carbonate (OSCAL) 1500 (600 Ca) MG TABS tablet Take 600 mg of elemental calcium by mouth daily.    . calcium carbonate (TUMS) 500 MG chewable tablet Chew 1 tablet (200 mg of elemental calcium total) by mouth daily. 100 tablet 3  . Cholecalciferol (VITAMIN D) 1000 UNITS capsule Take 1,000 Units by mouth daily.      Marland Kitchen esomeprazole (NEXIUM) 40 MG capsule Take 1 capsule (40 mg total) by mouth 2 (two) times daily before a meal. 60 capsule 1  . Estradiol 10 MCG TABS Place 1 tablet vaginally 2 (two) times a week.     . fluticasone (FLONASE) 50 MCG/ACT nasal spray Place 2 sprays into the nose as needed for allergies or rhinitis.     . Lactobacillus Rhamnosus, GG, (CULTURELLE PO) Take 1 tablet by mouth daily.    Marland Kitchen loratadine (CLARITIN) 10 MG tablet Take 10 mg by mouth as directed.    . metoprolol tartrate (LOPRESSOR) 25 MG tablet Take 0.5 tablets (12.5 mg total) by mouth daily  as needed (palpitations). 30 tablet 3  . Multiple Vitamins-Minerals (CENTRUM SILVER PO) Take 1 tablet by mouth 3 (three) times a week. Take 1 tablet by mouth twice a week    . ranitidine (ZANTAC) 150 MG tablet Take 150 mg by mouth daily as needed for heartburn.    . tobramycin-dexamethasone (TOBRADEX) ophthalmic solution Place 1 drop into the right ear as directed.     Marland Kitchen omeprazole (PRILOSEC) 20 MG capsule Take 20 mg by mouth daily as needed (acid reflux).      No current facility-administered medications on file prior to visit.      Review of Systems Constitutional:   No  weight loss, night  sweats,  Fevers, chills, fatigue, or  lassitude.  HEENT:   No headaches,  Difficulty swallowing,  Tooth/dental problems, or  Sore throat,                No sneezing, itching, ear ache,  +nasal congestion, post nasal drip,   CV:  No chest pain,  Orthopnea, PND, swelling in lower extremities, anasarca, dizziness, palpitations, syncope.   GI  No heartburn, indigestion, abdominal pain, nausea, vomiting, diarrhea, change in bowel habits, loss of appetite, bloody stools.   Resp: N   No chest wall deformity  Skin: no rash or lesions.  GU: no dysuria, change in color of urine, no urgency or frequency.  No flank pain, no hematuria   MS:  No joint pain or swelling.  No decreased range of motion.  No back pain.  Psych:  No change in mood or affect. No depression or anxiety.  No memory loss.         Objective:   Physical Exam Vitals:   04/05/16 0945  BP: 118/60  Pulse: 86  Temp: 97.8 F (36.6 C)  TempSrc: Oral  SpO2: 100%  Weight: 100 lb 6.4 oz (45.5 kg)  Height: 5\' 2"  (1.575 m)   GEN: A/Ox3; pleasant , NAD, thin    HEENT:  [place]/AT,  EACs-clear, TMs-wnl, NOSE-clear, THROAT-clear, no lesions, no postnasal drip or exudate noted.   NECK:  Supple w/ fair ROM; no JVD; normal carotid impulses w/o bruits; no thyromegaly or nodules palpated; no lymphadenopathy.    RESP  Clear  P & A; w/o, wheezes/ rales/ or rhonchi. no accessory muscle use, no dullness to percussion  CARD:  RRR, no m/r/g  , no peripheral edema, pulses intact, no cyanosis or clubbing.  GI:   Soft & nt; nml bowel sounds; no organomegaly or masses detected.   Musco: Warm bil, no deformities or joint swelling noted.   Neuro: alert, no focal deficits noted.    Skin: Warm, no lesions or rashes  [name] NP-C  [facility]  04/05/2016       Assessment & Plan:

## 2016-04-09 ENCOUNTER — Telehealth: Payer: Self-pay | Admitting: *Deleted

## 2016-04-09 NOTE — Telephone Encounter (Signed)
Initiated PA for Symbicort 80/4.5 thru [facility] ([facility]) Pt ID: [id number] Key: Cindie Laroche [id number].  Sent for review.  Monica [name] (Key: [id number])   [facility] is reviewing your PA request and will respond within 24-72 hours. To check for an update later, open this request from your dashboard. You may close this dialog and return to your dashboard to perform other tasks.

## 2016-04-14 ENCOUNTER — Encounter: Payer: Self-pay | Admitting: Adult Health

## 2016-04-15 ENCOUNTER — Encounter: Payer: Self-pay | Admitting: Adult Health

## 2016-04-16 NOTE — Telephone Encounter (Signed)
I spoke with Vicky with [employer], who states the PA that was completed on 04-11-16 for symbicort was denied. Covered alteratives are advair.  TP please advise. Thanks.

## 2016-04-16 NOTE — Progress Notes (Addendum)
Cardiology Office Note    Date:  04/17/2016   ID:  Monica [name], DOB January 16, 1946, MRN [id number]  Requesting physician:  Walker Kehr, MD  Cardiologist:   Sanda Klein, MD    Chief Complaint  Patient presents with  . Follow-up    pt c/o heart palpitations     History of Present Illness:  Monica [name] is a 70 y.o. retired Marine scientist who presents for evaluation after a single episode of possible atrial fibrillation that occurred on Mother's Day 2017. Reportedly the "rhythm strips" performed by EMS showed atrial fibrillation and this was confirmed on evaluation by the emergency room physician. Not long after she arrived in the emergency room the rhythm converted to normal sinus rhythm. Unfortunately the EMS electrocardiogram was not scanned in.  Evaluation has included a normal echocardiogram with a small left atrial size, normal TSH and generally normal lab work. She is physically active and denies exertional dyspnea or angina. She has no history of heart problems, stroke, TIA, peripheral embolism, bleeding problems. She has had "PVCs" for many years and initially thought that her palpitations might simply represent PVCs. She finds herself under a lot of emotional stress recently since her husband has early cognitive impairment and their interaction has often become frustrating.  Other comorbid conditions include bronchiectasis, gastroesophageal reflux disease, irritable bowel syndrome, osteoporosis, chronic sinusitis and mild anxiety disorder, none of which are currently a big problem. She has recently been started on Symbicort, and has not noticed any change in her heart rate or symptoms since then.  Since her previous appointment in June, she has had 3 episodes of palpitations. On each occasion the symptoms only lasted for about 5 minutes and she believes it was more likely that she had PVCs rather than atrial fibrillation. By the time she had a chance to take the prescribed as needed  dose of metoprolol, her symptoms had spontaneously resolved.  Past Medical History:  Diagnosis Date  . Adenomatous colon polyp 10/2003  . Allergic rhinitis   . Allergy    SEASONAL  . Bronchiectasis   . Chronic sinusitis   . Diverticulosis   . GERD (gastroesophageal reflux disease)   . Hearing loss   . Hemorrhoids   . Insomnia   . Menopausal disorder   . Osteoporosis   . Palpitations   . TMJ syndrome   . Urticaria     Past Surgical History:  Procedure Laterality Date  . COLONOSCOPY    . NASAL SINUS SURGERY    . TYMPANOSTOMY      Current Medications: Outpatient Medications Prior to Visit  Medication Sig Dispense Refill  . acetaminophen (TYLENOL) 500 MG tablet Take 500 mg by mouth every 6 (six) hours as needed for moderate pain or headache.    . ALPRAZolam (XANAX) 0.25 MG tablet Take 1 tablet (0.25 mg total) by mouth 2 (two) times daily as needed for anxiety (palpitations). 60 tablet 1  . aspirin EC 81 MG tablet Take 1 tablet (81 mg total) by mouth daily. 90 tablet 3  . budesonide-formoterol (SYMBICORT) 80-4.5 MCG/ACT inhaler Inhale 2 puffs into the lungs 2 (two) times daily. 1 Inhaler 5  . calcium carbonate (OSCAL) 1500 (600 Ca) MG TABS tablet Take 600 mg of elemental calcium by mouth daily.    . calcium carbonate (TUMS) 500 MG chewable tablet Chew 1 tablet (200 mg of elemental calcium total) by mouth daily. 100 tablet 3  . Cholecalciferol (VITAMIN D) 1000 UNITS capsule Take 1,000 Units by  mouth daily.      Marland Kitchen esomeprazole (NEXIUM) 40 MG capsule Take 1 capsule (40 mg total) by mouth 2 (two) times daily before a meal. 60 capsule 1  . Estradiol 10 MCG TABS Place 1 tablet vaginally 2 (two) times a week.     . fluticasone (FLONASE) 50 MCG/ACT nasal spray Place 2 sprays into the nose as needed for allergies or rhinitis.     . Lactobacillus Rhamnosus, GG, (CULTURELLE PO) Take 1 tablet by mouth daily.    Marland Kitchen loratadine (CLARITIN) 10 MG tablet Take 10 mg by mouth as directed.    .  metoprolol tartrate (LOPRESSOR) 25 MG tablet Take 0.5 tablets (12.5 mg total) by mouth daily as needed (palpitations). 30 tablet 3  . Multiple Vitamins-Minerals (CENTRUM SILVER PO) Take 1 tablet by mouth 3 (three) times a week. Take 1 tablet by mouth twice a week    . ranitidine (ZANTAC) 150 MG tablet Take 150 mg by mouth daily as needed for heartburn.    Marland Kitchen Respiratory Therapy Supplies (FLUTTER) DEVI Use as directed 1 each 0  . tobramycin-dexamethasone (TOBRADEX) ophthalmic solution Place 1 drop into the right ear as directed.     Marland Kitchen omeprazole (PRILOSEC) 20 MG capsule Take 20 mg by mouth daily as needed (acid reflux).      No facility-administered medications prior to visit.      Allergies:   Amoxicillin-pot clavulanate; Avelox [moxifloxacin hcl in nacl]; Bactrim [sulfamethoxazole-trimethoprim]; Benzonatate; Ciprofloxacin; and Sulfonamide derivatives   Social History   Social History  . Marital status: Married    Spouse name: [name]  . Number of children: 0  . Years of education: BSN   Occupational History  . retired Therapist, sports    Social History Main Topics  . Smoking status: Never Smoker  . Smokeless tobacco: Never Used  . Alcohol use 1.2 oz/week    2 Glasses of wine per week  . Drug use: No  . Sexual activity: Not Asked   Other Topics Concern  . None   Social History Narrative   Lives with husband   Caffeine use: 1 cup tea per day     Family History:  The patient's family history includes AVM in her brother; Cancer in her maternal grandmother, mother, and paternal uncle; Colon cancer (age of onset: 56) in her mother; Heart disease in her father and maternal grandfather; Hypertension in her father; Rectal cancer in her mother; Seizures in her mother.   ROS:   Please see the history of present illness.    ROS All other systems reviewed and are negative.   PHYSICAL EXAM:   VS:  BP 94/60   Pulse 80   Ht 5\' 2"  (1.575 m)   Wt 98 lb 9.6 oz (44.7 kg)   BMI 18.03 kg/m    GEN: Well  nourished, well developed, in no acute distress  HEENT: normal  Neck: no JVD, carotid bruits, or masses Cardiac: RRR; no murmurs, rubs, or gallops,no edema  Respiratory:  clear to auscultation bilaterally, normal work of breathing GI: soft, nontender, nondistended, + BS MS: no deformity or atrophy  Skin: warm and dry, no rash Neuro:  Alert and Oriented x 3, Strength and sensation are intact Psych: euthymic mood, full affect  Wt Readings from Last 3 Encounters:  04/17/16 98 lb 9.6 oz (44.7 kg)  04/05/16 100 lb 6.4 oz (45.5 kg)  02/01/16 100 lb (45.4 kg)      Studies/Labs Reviewed:   EKG:  EKG is ordered today.  The ekg ordered today demonstrates Sinus rhythm, normal tracing, QTC 422 ms  Recent Labs: 09/17/2015: BUN 12; Creatinine, Ser 0.86; Hemoglobin 13.5; Magnesium 2.1; Platelets 302; Potassium 3.6; Sodium 136; TSH 4.381   Lipid Panel    Component Value Date/Time   CHOL 157 04/13/2014 1156   TRIG 71.0 04/13/2014 1156   HDL 65.60 04/13/2014 1156   CHOLHDL 2 04/13/2014 1156   VLDL 14.2 04/13/2014 1156   LDLCALC 77 04/13/2014 1156    Additional studies/ records that were reviewed today include:  Notes from the emergency room and notes from Dr. Alain Marion    ASSESSMENT:    1. PAF (paroxysmal atrial fibrillation) [facility])      PLAN:   Mrs. Liva appears to have lone atrial fibrillation. Unfortunately, firm documentation of the arrhythmia is lacking, other than being mentioned in the emergency room physician's notes.  CHADSVasc score of 2 (CHADS score 1), but the indication is rather "soft": she only obtains points for female gender and age (and biologically she seems much younger than her chronological age). She appears to have a pretty much balanced chance for benefit and risk of harm with full oral anticoagulation. By strict guidelines she should receive anticoagulation, but I believe her embolic risk is low. I also believe her bleeding risk is high due to her very small  body size.  We have discussed the pros and cons of anticoagulation in a lot of detail. For the time being we agreed that she'll remain on aspirin. However, even the slightest neurological event that could be treated to embolic phenomena would lead to a firm recommendation for full anticoagulation.  Her symptoms are infrequent, brief and mild. Antiarrhythmics are not justified. Her blood pressure today is quite low and again I doubt that she would tolerate daily beta blockers.   Medication Adjustments/Labs and Tests Ordered: Current medicines are reviewed at length with the patient today.  Concerns regarding medicines are outlined above.  Medication changes, Labs and Tests ordered today are listed in the Patient Instructions below. Patient Instructions  Dr Sallyanne Kuster recommends that you schedule a follow-up appointment in 6 months. You will receive a reminder letter in the mail two months in advance. If you don't receive a letter, please call our office to schedule the follow-up appointment.  If you need a refill on your cardiac medications before your next appointment, please call your pharmacy.    Signed, Sanda Klein, MD  04/17/2016 8:45 AM    [facility] [address], [place], [place]  [address] Phone: [phone]; Fax: [phone]

## 2016-04-17 ENCOUNTER — Encounter: Payer: Self-pay | Admitting: Cardiovascular Disease

## 2016-04-17 ENCOUNTER — Ambulatory Visit (INDEPENDENT_AMBULATORY_CARE_PROVIDER_SITE_OTHER): Payer: Medicare Other | Admitting: Cardiovascular Disease

## 2016-04-17 VITALS — BP 94/60 | HR 80 | Ht 62.0 in | Wt 98.6 lb

## 2016-04-17 DIAGNOSIS — I480 Paroxysmal atrial fibrillation: Secondary | ICD-10-CM | POA: Diagnosis not present

## 2016-04-17 NOTE — Patient Instructions (Signed)
Dr Croitoru recommends that you schedule a follow-up appointment in 6 months. You will receive a reminder letter in the mail two months in advance. If you don't receive a letter, please call our office to schedule the follow-up appointment.  If you need a refill on your cardiac medications before your next appointment, please call your pharmacy. 

## 2016-04-18 NOTE — Telephone Encounter (Signed)
Advair is fine  Advair  100 1 puff .Twice daily , rinse after use.  Please contact office for sooner follow up if symptoms do not improve or worsen or seek emergency care

## 2016-04-19 ENCOUNTER — Other Ambulatory Visit: Payer: Self-pay

## 2016-04-19 MED ORDER — FLUTICASONE-SALMETEROL 100-50 MCG/DOSE IN AEPB: 1.0000 | INHALATION_SPRAY | Freq: Two times a day (BID) | RESPIRATORY_TRACT | 4 refills | Status: DC

## 2016-04-23 NOTE — Telephone Encounter (Signed)
PA for Symbicort is denied- insurance requires that pt must try and fail advair before covering symbicort.  BQ please advise.  Thanks!

## 2016-04-23 NOTE — Telephone Encounter (Signed)
Spoke with pt and she stated she already has a rx. She spoke with someone in email and the same suggestion was made. She stated she already has her new inhaler. Nothing further is needed at this time.   April 19, 2016  Shon Hale, CMA  to Monica [name]       4:12 PM  Hi Monica [name],   I have sent Avair to your preferred pharmacy. This medication is to be used 1 puff two times daily.  Please let us know if you have nay further questions or concerns.   Thank you.    Last read by Monica [name] at 4:22 PM on 04/19/2016.

## 2016-04-23 NOTE — Telephone Encounter (Signed)
Please advise Dr Lamonte Sakai. Thanks.

## 2016-04-23 NOTE — Telephone Encounter (Signed)
I think meant for RB

## 2016-04-23 NOTE — Telephone Encounter (Signed)
Please ask her if she is Ok trying Advair. If so order Advair 250/50 bid.

## 2016-04-24 ENCOUNTER — Telehealth: Payer: Self-pay | Admitting: Adult Health

## 2016-04-24 NOTE — Telephone Encounter (Signed)
PA form received for the pt for the symbicort----the pts insurance requires that the pt try and fail advair Hfa or discus first.  TP please advise. thanks

## 2016-04-25 ENCOUNTER — Ambulatory Visit (INDEPENDENT_AMBULATORY_CARE_PROVIDER_SITE_OTHER): Payer: Medicare Other | Admitting: Adult Health

## 2016-04-25 ENCOUNTER — Encounter: Payer: Self-pay | Admitting: Adult Health

## 2016-04-25 ENCOUNTER — Ambulatory Visit (HOSPITAL_BASED_OUTPATIENT_CLINIC_OR_DEPARTMENT_OTHER)
Admission: RE | Admit: 2016-04-25 | Discharge: 2016-04-25 | Disposition: A | Discharge status: Home / Self Care | Payer: Medicare Other | Source: Ambulatory Visit | Attending: Adult Health | Admitting: Adult Health

## 2016-04-25 VITALS — BP 108/62 | HR 83 | Temp 98.2°F | Ht 62.0 in | Wt 99.8 lb

## 2016-04-25 DIAGNOSIS — J470 Bronchiectasis with acute lower respiratory infection: Secondary | ICD-10-CM | POA: Insufficient documentation

## 2016-04-25 DIAGNOSIS — R05 Cough: Secondary | ICD-10-CM | POA: Diagnosis not present

## 2016-04-25 DIAGNOSIS — J471 Bronchiectasis with (acute) exacerbation: Secondary | ICD-10-CM

## 2016-04-25 DIAGNOSIS — J189 Pneumonia, unspecified organism: Secondary | ICD-10-CM | POA: Insufficient documentation

## 2016-04-25 DIAGNOSIS — J301 Allergic rhinitis due to pollen: Secondary | ICD-10-CM

## 2016-04-25 MED ORDER — AZITHROMYCIN 250 MG PO TABS: ORAL_TABLET | ORAL | 0 refills | Status: AC

## 2016-04-25 MED ORDER — CEFUROXIME AXETIL 500 MG PO TABS: 500.0000 mg | ORAL_TABLET | Freq: Two times a day (BID) | ORAL | 0 refills | Status: AC

## 2016-04-25 MED FILL — AZITHROMYCIN 250 MG TABLET: 250 | 5 days supply | Qty: 6 | Fill #0

## 2016-04-25 NOTE — Telephone Encounter (Signed)
On advair , seen in office  Nothing further

## 2016-04-25 NOTE — Addendum Note (Signed)
Addended by: Len Blalock on: 04/25/2016 12:28 PM   Modules accepted: Orders

## 2016-04-25 NOTE — Assessment & Plan Note (Signed)
Flare with ? URI/Bronchitis  Cont w/ trigger control AR/GERD  Cont w/ mucocilliary clearance flutter /mucinex  Consider VEST going forwary  ? MAI , Check sputum cx , AFB , if not able and cont w Magnus Sinning may need FOB  Check cxr   Plan  Patient Instructions  Increase Advair 1 puff Twice daily  , rinse after use.  Continue on Flutter valve Three times a day  .  Mucinex Twice daily  .As needed  Congestion  Restart GERD treatment w/ Zantac 150mg  Twice daily  .  Zpack take as directed  Chest xray today .  Follow up Dr. Lamonte Sakai  In 4 weeks and As needed   Please contact office for sooner follow up if symptoms do not improve or worsen or seek emergency care

## 2016-04-25 NOTE — Patient Instructions (Addendum)
Increase Advair 1 puff Twice daily  , rinse after use.  Continue on Flutter valve Three times a day  .  Mucinex Twice daily  .As needed  Congestion  Restart GERD treatment w/ Zantac 150mg  Twice daily  .  Zpack take as directed  Chest xray today .  Follow up Dr. Lamonte Sakai  In 4 weeks and As needed   Please contact office for sooner follow up if symptoms do not improve or worsen or seek emergency care

## 2016-04-25 NOTE — Addendum Note (Signed)
Addended by: Len Blalock on: 04/25/2016 12:23 PM   Modules accepted: Orders

## 2016-04-25 NOTE — Assessment & Plan Note (Signed)
Mild flare without obvious infection  Avoid antihistamine if possible  Use saline nasal rinses As needed

## 2016-04-25 NOTE — Progress Notes (Signed)
@Patient  ID: Monica [name], female    DOB: [birth year], 70 y.o.   MRN: [id number]  Chief Complaint  Patient presents with  . Acute Visit    Cough     Referring provider: Cassandria Anger, MD  HPI: 70 yo female never smoker followed for AR , Urticaria, and Bronchiectasis   TEST  01/2016 CT chest >Hyperinflated lungs with old granulomatous disease changes, bronchiectasis, scattered mucous plugging, and scattered stable pulmonary nodules.Volume loss and scarring in RIGHT middle lobe increased since previous exam.Scattered mild slightly nodular peribronchovascular infiltrates in both lungs, nonspecific but suggesting chronic infection question MAC.  04/25/2016 Acute OV : Bronchiectasis-suspected MAI /GERD  Pt presents for an acute office visit . Complains over last couple of weeks she is not feeling good. Has had a lot of sinus drainage,  Cough , mucus stuck in throat, headache. Cough is getting worse.  Feels she has not been doing as well for last 3 months . Less energy and more coughing. No fever, hemoptysis , or coughing up discolored mucsu .  Last ov she was started on Symbicort and Flutter valve Insurance will not cover symbicort so switched to Advair. Using Flutter valve. Three times a day  . Not sure if this is helping . She did stop GERD tx , afraid of bone loss with PPI. Only taking Advair daily .     Allergies  Allergen Reactions  . Amoxicillin-Pot Clavulanate Diarrhea    Has patient had a PCN reaction causing immediate rash, facial/tongue/throat swelling, SOB or lightheadedness with hypotension:No Has patient had a PCN reaction causing severe rash involving mucus membranes or skin necrosis:No Has patient had a PCN reaction that required hospitalization:No Has patient had a PCN reaction occurring within the last 10 years:Yes If all of the above answers are "NO", then may proceed with Cephalosporin use  . Avelox [Moxifloxacin Hcl In Nacl] Nausea Only  . Bactrim  [Sulfamethoxazole-Trimethoprim] Other (See Comments)    flushing  . Benzonatate Other (See Comments)    felt bad, out of sorts, disoriented.  . Ciprofloxacin Rash    Rash across abdomen  . Sulfonamide Derivatives Palpitations and Other (See Comments)    Felt flushed    Immunization History  Administered Date(s) Administered  . Influenza Split 02/25/2011, 02/05/2012  . Influenza Whole 03/02/2008, 03/02/2009, 02/06/2010  . Influenza,inj,Quad PF,36+ Mos 02/04/2013, 01/24/2014, 02/02/2015, 02/01/2016  . Pneumococcal Conjugate-13 02/16/2013  . Pneumococcal Polysaccharide-23 03/11/2003, 01/16/2009, 09/26/2014  . Td 06/06/2001  . Tdap 07/17/2012  . Zoster 05/07/2007    Past Medical History:  Diagnosis Date  . Adenomatous colon polyp 10/2003  . Allergic rhinitis   . Allergy    SEASONAL  . Bronchiectasis   . Chronic sinusitis   . Diverticulosis   . GERD (gastroesophageal reflux disease)   . Hearing loss   . Hemorrhoids   . Insomnia   . Menopausal disorder   . Osteoporosis   . Palpitations   . TMJ syndrome   . Urticaria     Tobacco History: History  Smoking Status  . Never Smoker  Smokeless Tobacco  . Never Used   Counseling given: Not Answered   Outpatient Encounter Prescriptions as of 04/25/2016  Medication Sig  . acetaminophen (TYLENOL) 500 MG tablet Take 500 mg by mouth every 6 (six) hours as needed for moderate pain or headache.  . ALPRAZolam (XANAX) 0.25 MG tablet Take 1 tablet (0.25 mg total) by mouth 2 (two) times daily as needed for anxiety (palpitations).  Marland Kitchen  aspirin EC 81 MG tablet Take 1 tablet (81 mg total) by mouth daily.  . calcium carbonate (OSCAL) 1500 (600 Ca) MG TABS tablet Take 600 mg of elemental calcium by mouth daily.  . calcium carbonate (TUMS) 500 MG chewable tablet Chew 1 tablet (200 mg of elemental calcium total) by mouth daily.  . Cholecalciferol (VITAMIN D) 1000 UNITS capsule Take 1,000 Units by mouth daily.    Marland Kitchen esomeprazole (NEXIUM) 40 MG  capsule Take 1 capsule (40 mg total) by mouth 2 (two) times daily before a meal.  . Estradiol 10 MCG TABS Place 1 tablet vaginally 2 (two) times a week.   . fluticasone (FLONASE) 50 MCG/ACT nasal spray Place 2 sprays into the nose as needed for allergies or rhinitis.   . Fluticasone-Salmeterol (ADVAIR DISKUS) 100-50 MCG/DOSE AEPB Inhale 1 puff into the lungs 2 (two) times daily.  . Lactobacillus Rhamnosus, GG, (CULTURELLE PO) Take 1 tablet by mouth daily.  Marland Kitchen loratadine (CLARITIN) 10 MG tablet Take 10 mg by mouth as directed.  . metoprolol tartrate (LOPRESSOR) 25 MG tablet Take 0.5 tablets (12.5 mg total) by mouth daily as needed (palpitations).  . Multiple Vitamins-Minerals (CENTRUM SILVER PO) Take 1 tablet by mouth 3 (three) times a week. Take 1 tablet by mouth twice a week  . ranitidine (ZANTAC) 150 MG tablet Take 150 mg by mouth daily as needed for heartburn.  Marland Kitchen Respiratory Therapy Supplies (FLUTTER) DEVI Use as directed  . tobramycin-dexamethasone (TOBRADEX) ophthalmic solution Place 1 drop into the right ear as directed.   Marland Kitchen azithromycin (ZITHROMAX Z-PAK) 250 MG tablet Take 2 tablets (500 mg) on  Day 1,  followed by 1 tablet (250 mg) once daily on Days 2 through 5.  . [DISCONTINUED] budesonide-formoterol (SYMBICORT) 80-4.5 MCG/ACT inhaler Inhale 2 puffs into the lungs 2 (two) times daily. (Patient not taking: Reported on 04/25/2016)   No facility-administered encounter medications on file as of 04/25/2016.      Review of Systems  Constitutional:   No  weight loss, night sweats,  Fevers, chills,  +fatigue, or  lassitude.  HEENT:   No headaches,  Difficulty swallowing,  Tooth/dental problems, or  Sore throat,                No sneezing, itching, ear ache,  +nasal congestion, post nasal drip,   CV:  No chest pain,  Orthopnea, PND, swelling in lower extremities, anasarca, dizziness, palpitations, syncope.   GI  No heartburn, indigestion, abdominal pain, nausea, vomiting, diarrhea,  change in bowel habits, loss of appetite, bloody stools.   Resp:    No chest wall deformity  Skin: no rash or lesions.  GU: no dysuria, change in color of urine, no urgency or frequency.  No flank pain, no hematuria   MS:  No joint pain or swelling.  No decreased range of motion.  No back pain.    Physical Exam  BP 108/62 (BP Location: Left Arm, Cuff Size: Normal)   Pulse 83   Temp 98.2 F (36.8 C) (Oral)   Ht 5\' 2"  (1.575 m)   Wt 99 lb 12.8 oz (45.3 kg)   SpO2 100%   BMI 18.25 kg/m   GEN: A/Ox3; pleasant , NAD, thin female    HEENT:  [place]/AT,  EACs-clear, TMs-wnl, NOSE-clear, THROAT-clear, no lesions, no postnasal drip or exudate noted.   NECK:  Supple w/ fair ROM; no JVD; normal carotid impulses w/o bruits; no thyromegaly or nodules palpated; no lymphadenopathy.    RESP  Trace  rhonchi , w/o, wheezes/ rales/  no accessory muscle use, no dullness to percussion  CARD:  RRR, no m/r/g, no peripheral edema, pulses intact, no cyanosis or clubbing.  GI:   Soft & nt; nml bowel sounds; no organomegaly or masses detected.   Musco: Warm bil, no deformities or joint swelling noted.   Neuro: alert, no focal deficits noted.    Skin: Warm, no lesions or rashes  Psych:  No change in mood or affect. No depression or anxiety.  No memory loss.  Lab Results:  CBC    Component Value Date/Time   WBC 8.6 09/17/2015 2020   RBC 4.38 09/17/2015 2020   HGB 13.5 09/17/2015 2020   HCT 39.1 09/17/2015 2020   PLT 302 09/17/2015 2020   MCV 89.3 09/17/2015 2020   MCH 30.8 09/17/2015 2020   MCHC 34.5 09/17/2015 2020   RDW 12.2 09/17/2015 2020   LYMPHSABS 1.9 09/17/2015 2020   MONOABS 0.7 09/17/2015 2020   EOSABS 0.1 09/17/2015 2020   BASOSABS 0.0 09/17/2015 2020    BMET    Component Value Date/Time   NA 136 09/17/2015 2020   K 3.6 09/17/2015 2020   CL 101 09/17/2015 2020   CO2 24 09/17/2015 2020   GLUCOSE 121 (H) 09/17/2015 2020   BUN 12 09/17/2015 2020   CREATININE 0.86  09/17/2015 2020   CALCIUM 9.2 09/17/2015 2020   GFRNONAA >60 09/17/2015 2020   GFRAA >60 09/17/2015 2020    BNP No results found for: BNP  ProBNP No results found for: PROBNP  Imaging: No results found.   Assessment & Plan:   ALLERGIC RHINITIS, SEASONAL Mild flare without obvious infection  Avoid antihistamine if possible  Use saline nasal rinses As needed    Bronchiectasis without acute exacerbation Flare with ? URI/Bronchitis  Cont w/ trigger control AR/GERD  Cont w/ mucocilliary clearance flutter /mucinex  Consider VEST going forwary  ? MAI , Check sputum cx , AFB , if not able and cont w Magnus Sinning may need FOB  Check cxr   Plan  Patient Instructions  Increase Advair 1 puff Twice daily  , rinse after use.  Continue on Flutter valve Three times a day  .  Mucinex Twice daily  .As needed  Congestion  Restart GERD treatment w/ Zantac 150mg  Twice daily  .  Zpack take as directed  Chest xray today .  Follow up Dr. Lamonte Sakai  In 4 weeks and As needed   Please contact office for sooner follow up if symptoms do not improve or worsen or seek emergency care          Rexene Edison, NP 04/25/2016

## 2016-05-08 ENCOUNTER — Encounter: Payer: Self-pay | Admitting: Adult Health

## 2016-05-08 DIAGNOSIS — J479 Bronchiectasis, uncomplicated: Secondary | ICD-10-CM

## 2016-05-09 NOTE — Telephone Encounter (Signed)
TP, pt wants to know if she needs a chest xray prior to her 05/13/16 office visit with you.  Please advise. Thanks!

## 2016-05-13 ENCOUNTER — Ambulatory Visit (INDEPENDENT_AMBULATORY_CARE_PROVIDER_SITE_OTHER)
Admission: RE | Admit: 2016-05-13 | Discharge: 2016-05-13 | Disposition: A | Discharge status: Home / Self Care | Payer: Medicare Other | Source: Ambulatory Visit | Attending: Adult Health | Admitting: Adult Health

## 2016-05-13 ENCOUNTER — Other Ambulatory Visit (INDEPENDENT_AMBULATORY_CARE_PROVIDER_SITE_OTHER): Payer: Medicare Other

## 2016-05-13 ENCOUNTER — Encounter: Payer: Self-pay | Admitting: Adult Health

## 2016-05-13 ENCOUNTER — Ambulatory Visit (INDEPENDENT_AMBULATORY_CARE_PROVIDER_SITE_OTHER): Payer: Medicare Other | Admitting: Adult Health

## 2016-05-13 VITALS — BP 100/60 | HR 100 | Temp 97.8°F | Ht 62.0 in | Wt 97.8 lb

## 2016-05-13 DIAGNOSIS — J479 Bronchiectasis, uncomplicated: Secondary | ICD-10-CM

## 2016-05-13 DIAGNOSIS — J189 Pneumonia, unspecified organism: Secondary | ICD-10-CM

## 2016-05-13 DIAGNOSIS — J181 Lobar pneumonia, unspecified organism: Secondary | ICD-10-CM | POA: Diagnosis not present

## 2016-05-13 DIAGNOSIS — J471 Bronchiectasis with (acute) exacerbation: Secondary | ICD-10-CM

## 2016-05-13 DIAGNOSIS — R05 Cough: Secondary | ICD-10-CM | POA: Diagnosis not present

## 2016-05-13 LAB — BASIC METABOLIC PANEL
BUN: 16 mg/dL (ref 6–23)
CALCIUM: 9.3 mg/dL (ref 8.4–10.5)
CO2: 28 meq/L (ref 19–32)
CREATININE: 0.7 mg/dL (ref 0.40–1.20)
Chloride: 101 mEq/L (ref 96–112)
GFR: 87.63 mL/min (ref >60.00)
GLUCOSE: 98 mg/dL (ref 70–99)
Potassium: 4.1 mEq/L (ref 3.5–5.1)
Sodium: 137 mEq/L (ref 135–145)

## 2016-05-13 LAB — CBC WITH DIFFERENTIAL/PLATELET
BASOS PCT: 0.5 % (ref 0.0–3.0)
Basophils Absolute: 0.1 10*3/uL (ref 0.0–0.1)
EOS PCT: 0.9 % (ref 0.0–5.0)
Eosinophils Absolute: 0.1 10*3/uL (ref 0.0–0.7)
HCT: 40.3 % (ref 36.0–46.0)
Hemoglobin: 13.8 g/dL (ref 12.0–15.0)
LYMPHS ABS: 1.3 10*3/uL (ref 0.7–4.0)
Lymphocytes Relative: 12.9 % (ref 12.0–46.0)
MCHC: 34.3 g/dL (ref 30.0–36.0)
MCV: 87.7 fl (ref 78.0–100.0)
MONOS PCT: 6.2 % (ref 3.0–12.0)
Monocytes Absolute: 0.6 10*3/uL (ref 0.1–1.0)
NEUTROS ABS: 8.3 10*3/uL — AB (ref 1.4–7.7)
NEUTROS PCT: 79.5 % — AB (ref 43.0–77.0)
Platelets: 432 10*3/uL — ABNORMAL HIGH (ref 150.0–400.0)
RBC: 4.6 Mil/uL (ref 3.87–5.11)
RDW: 13.3 % (ref 11.5–15.5)
WBC: 10.4 10*3/uL (ref 4.0–10.5)

## 2016-05-13 NOTE — Progress Notes (Signed)
_0  ID: Monica [name], female    DOB: 21-Apr-1946, 71 y.o.   MRN: [id number]  Chief Complaint  Patient presents with  . Follow-up    PNA     Referring provider: Cassandria Anger, MD   _1  ID: Monica [name], female    DOB: 11/29/1945, 71 y.o.   MRN: [id number]  Chief Complaint  Patient presents with  . Follow-up    PNA     Referring provider: Cassandria Anger, MD  HPI: 71 yo female never smoker followed for AR , Urticaria, and Bronchiectasis   TEST  01/2016 CT chest >Hyperinflated lungs with old granulomatous disease changes, bronchiectasis, scattered mucous plugging, and scattered stable pulmonary nodules.Volume loss and scarring in RIGHT middle lobe increased since previous exam.Scattered mild slightly nodular peribronchovascular infiltrates in both lungs, nonspecific but suggesting chronic infection question MAC.  05/13/2016 Follow up ; Bronchiectasis and PNA  Pt returns for 2 week follow up for PNA . She was seen last ov for an acute office visit with increased cough and congestion . Started on [employer] . CXR showed RML/RLL PNA , Ceftin was added to her regimen .  She returns today not feeling much better, still feels very weak, headache , mild nausea and no energy . Symptoms seem to wax and wane.  Cough is better . No dyspnea. CXR today shows persistent PNA on right , no sign change from last ov.  Sputum cx was recommended last ov but was unable to obtain as her cough is mainly dry .     Allergies  Allergen Reactions  . Amoxicillin-Pot Clavulanate Diarrhea    Has patient had a PCN reaction causing immediate rash, facial/tongue/throat swelling, SOB or lightheadedness with hypotension:No Has patient had a PCN reaction causing severe rash involving mucus membranes or skin necrosis:No Has patient had a PCN reaction that required hospitalization:No Has patient had a PCN reaction occurring within the last 10 years:Yes If all of the above answers are  "NO", then may proceed with Cephalosporin use  . Avelox [Moxifloxacin Hcl In Nacl] Nausea Only  . Bactrim [Sulfamethoxazole-Trimethoprim] Other (See Comments)    flushing  . Benzonatate Other (See Comments)    felt bad, out of sorts, disoriented.  . Ciprofloxacin Rash    Rash across abdomen  . Sulfonamide Derivatives Palpitations and Other (See Comments)    Felt flushed    Immunization History  Administered Date(s) Administered  . Influenza Split 02/25/2011, 02/05/2012  . Influenza Whole 03/02/2008, 03/02/2009, 02/06/2010  . Influenza,inj,Quad PF,36+ Mos 02/04/2013, 01/24/2014, 02/02/2015, 02/01/2016  . Pneumococcal Conjugate-13 02/16/2013  . Pneumococcal Polysaccharide-23 03/11/2003, 01/16/2009, 09/26/2014  . Td 06/06/2001  . Tdap 07/17/2012  . Zoster 05/07/2007    Past Medical History:  Diagnosis Date  . Adenomatous colon polyp 10/2003  . Allergic rhinitis   . Allergy    SEASONAL  . Bronchiectasis   . Chronic sinusitis   . Diverticulosis   . GERD (gastroesophageal reflux disease)   . Hearing loss   . Hemorrhoids   . Insomnia   . Menopausal disorder   . Osteoporosis   . Palpitations   . TMJ syndrome   . Urticaria     Tobacco History: History  Smoking Status  . Never Smoker  Smokeless Tobacco  . Never Used   Counseling given: Not Answered   Outpatient Encounter Prescriptions as of 05/13/2016  Medication Sig  . acetaminophen (TYLENOL) 500 MG tablet Take 500 mg by mouth every 6 (six) hours as needed for  moderate pain or headache.  . ALPRAZolam (XANAX) 0.25 MG tablet Take 1 tablet (0.25 mg total) by mouth 2 (two) times daily as needed for anxiety (palpitations).  Marland Kitchen aspirin EC 81 MG tablet Take 1 tablet (81 mg total) by mouth daily.  . calcium carbonate (OSCAL) 1500 (600 Ca) MG TABS tablet Take 600 mg of elemental calcium by mouth daily.  . calcium carbonate (TUMS) 500 MG chewable tablet Chew 1 tablet (200 mg of elemental calcium total) by mouth daily.  .  Cholecalciferol (VITAMIN D) 1000 UNITS capsule Take 1,000 Units by mouth daily.    . Estradiol 10 MCG TABS Place 1 tablet vaginally 2 (two) times a week.   . fluticasone (FLONASE) 50 MCG/ACT nasal spray Place 2 sprays into the nose as needed for allergies or rhinitis.   . Fluticasone-Salmeterol (ADVAIR DISKUS) 100-50 MCG/DOSE AEPB Inhale 1 puff into the lungs 2 (two) times daily.  Marland Kitchen guaiFENesin (MUCINEX) 600 MG 12 hr tablet Take by mouth 2 (two) times daily.  . Lactobacillus Rhamnosus, GG, (CULTURELLE PO) Take 1 tablet by mouth daily.  Marland Kitchen loratadine (CLARITIN) 10 MG tablet Take 10 mg by mouth as directed.  . metoprolol tartrate (LOPRESSOR) 25 MG tablet Take 0.5 tablets (12.5 mg total) by mouth daily as needed (palpitations).  . Multiple Vitamins-Minerals (CENTRUM SILVER PO) Take 1 tablet by mouth 3 (three) times a week. Take 1 tablet by mouth twice a week  . ranitidine (ZANTAC) 150 MG tablet Take 150 mg by mouth 2 (two) times daily.   Marland Kitchen Respiratory Therapy Supplies (FLUTTER) DEVI Use as directed  . tobramycin-dexamethasone (TOBRADEX) ophthalmic solution Place 1 drop into the right ear as directed.   . [DISCONTINUED] esomeprazole (NEXIUM) 40 MG capsule Take 1 capsule (40 mg total) by mouth 2 (two) times daily before a meal.   No facility-administered encounter medications on file as of 05/13/2016.      Review of Systems  Constitutional:   No  weight loss, night sweats,  Fevers, chills,  +fatigue, or  lassitude.  HEENT:   No headaches,  Difficulty swallowing,  Tooth/dental problems, or  Sore throat,                No sneezing, itching, ear ache, nasal congestion, post nasal drip,   CV:  No chest pain,  Orthopnea, PND, swelling in lower extremities, anasarca, dizziness, palpitations, syncope.   GI  No heartburn, indigestion, abdominal pain, nausea, vomiting, diarrhea, change in bowel habits, loss of appetite, bloody stools.   Resp:    No chest wall deformity  Skin: no rash or  lesions.  GU: no dysuria, change in color of urine, no urgency or frequency.  No flank pain, no hematuria   MS:  No joint pain or swelling.  No decreased range of motion.  No back pain.    Physical Exam  BP 100/60   Pulse 100   Temp 97.8 F (36.6 C) (Oral)   Ht _0  (1.575 m)   Wt 97 lb 12.8 oz (44.4 kg)   SpO2 99%   BMI 17.89 kg/m   GEN: A/Ox3; pleasant , NAD, thin female    HEENT:  [place]/AT,  EACs-clear, TMs-wnl, NOSE-clear, THROAT-clear, no lesions, no postnasal drip or exudate noted.   NECK:  Supple w/ fair ROM; no JVD; normal carotid impulses w/o bruits; no thyromegaly or nodules palpated; no lymphadenopathy.    RESP  Decreased BS in bases ,  w/o, wheezes/ rales/  no accessory muscle use, no dullness to  percussion  CARD:  RRR, no m/r/g, no peripheral edema, pulses intact, no cyanosis or clubbing.  GI:   Soft & nt; nml bowel sounds; no organomegaly or masses detected.   Musco: Warm bil, no deformities or joint swelling noted.   Neuro: alert, no focal deficits noted.    Skin: Warm, no lesions or rashes  Psych:  No change in mood or affect. No depression or anxiety.  No memory loss.  Lab Results:  CBC    Component Value Date/Time   WBC 8.6 09/17/2015 2020   RBC 4.38 09/17/2015 2020   HGB 13.5 09/17/2015 2020   HCT 39.1 09/17/2015 2020   PLT 302 09/17/2015 2020   MCV 89.3 09/17/2015 2020   MCH 30.8 09/17/2015 2020   MCHC 34.5 09/17/2015 2020   RDW 12.2 09/17/2015 2020   LYMPHSABS 1.9 09/17/2015 2020   MONOABS 0.7 09/17/2015 2020   EOSABS 0.1 09/17/2015 2020   BASOSABS 0.0 09/17/2015 2020    BMET    Component Value Date/Time   NA 136 09/17/2015 2020   K 3.6 09/17/2015 2020   CL 101 09/17/2015 2020   CO2 24 09/17/2015 2020   GLUCOSE 121 (H) 09/17/2015 2020   BUN 12 09/17/2015 2020   CREATININE 0.86 09/17/2015 2020   CALCIUM 9.2 09/17/2015 2020   GFRNONAA >60 09/17/2015 2020   GFRAA >60 09/17/2015 2020    BNP No results found for:  BNP  ProBNP No results found for: PROBNP  Imaging: Dg Chest 2 View  Result Date: 05/13/2016 CLINICAL DATA:  Followup of pneumonia with residual cough and chest pain. History of chronic lung disease and bronchiectasis. EXAM: CHEST  2 VIEW COMPARISON:  04/25/2016 FINDINGS: Nearly identical residual irregular pulmonary opacity remains in the right middle lobe. Adjacent chronic pulmonary nodularity is stable. No edema, pleural fluid or pneumothorax identified. Stable chronic lung disease elsewhere. Stable and normal heart size and mediastinal contours. The bony thorax is unremarkable. IMPRESSION: Stable irregular pulmonary opacity remaining in the right middle lobe. This could still be the residua of recent pneumonia. Continued radiographic follow-up is recommended. Electronically Signed   By: Aletta Edouard M.D.   On: 05/13/2016 15:36   Dg Chest 2 View  Result Date: 04/25/2016 CLINICAL DATA:  Four months of cough, nonsmoker. History of bronchiectasis. EXAM: CHEST  2 VIEW COMPARISON:  Chest x-ray of Sep 17, 2015 and chest CT scan of January 10, 2016. FINDINGS: The lungs are hyperinflated with increased AP dimension of the thorax and flattening of the hemidiaphragms. There is new alveolar opacity in the right middle lobe with subtle increased density noted in the right lower lobe as well. There is stable right apical density. The heart and pulmonary vascularity are normal. The mediastinum is normal in width. There is calcification in the wall of the descending thoracic aorta. The bony thorax exhibits no acute abnormality. IMPRESSION: Acute pneumonia in the right middle lobe and to a lesser extent posterior right lower lobe. This is superimposed upon chronic interstitial disease and known bronchiectasis. Followup PA and lateral chest X-ray is recommended in 3-4 weeks following trial of antibiotic therapy to ensure resolution and exclude underlying malignancy. Electronically Signed   By: David  Martinique M.D.    On: 04/25/2016 10:24     Assessment & Plan:   No problem-specific Assessment & Plan notes found for this encounter.     Rexene Edison, NP 05/13/2016   HPI:   Allergies  Allergen Reactions  . Amoxicillin-Pot Clavulanate Diarrhea  Has patient had a PCN reaction causing immediate rash, facial/tongue/throat swelling, SOB or lightheadedness with hypotension:No Has patient had a PCN reaction causing severe rash involving mucus membranes or skin necrosis:No Has patient had a PCN reaction that required hospitalization:No Has patient had a PCN reaction occurring within the last 10 years:Yes If all of the above answers are "NO", then may proceed with Cephalosporin use  . Avelox [Moxifloxacin Hcl In Nacl] Nausea Only  . Bactrim [Sulfamethoxazole-Trimethoprim] Other (See Comments)    flushing  . Benzonatate Other (See Comments)    felt bad, out of sorts, disoriented.  . Ciprofloxacin Rash    Rash across abdomen  . Sulfonamide Derivatives Palpitations and Other (See Comments)    Felt flushed    Immunization History  Administered Date(s) Administered  . Influenza Split 02/25/2011, 02/05/2012  . Influenza Whole 03/02/2008, 03/02/2009, 02/06/2010  . Influenza,inj,Quad PF,36+ Mos 02/04/2013, 01/24/2014, 02/02/2015, 02/01/2016  . Pneumococcal Conjugate-13 02/16/2013  . Pneumococcal Polysaccharide-23 03/11/2003, 01/16/2009, 09/26/2014  . Td 06/06/2001  . Tdap 07/17/2012  . Zoster 05/07/2007    Past Medical History:  Diagnosis Date  . Adenomatous colon polyp 10/2003  . Allergic rhinitis   . Allergy    SEASONAL  . Bronchiectasis   . Chronic sinusitis   . Diverticulosis   . GERD (gastroesophageal reflux disease)   . Hearing loss   . Hemorrhoids   . Insomnia   . Menopausal disorder   . Osteoporosis   . Palpitations   . TMJ syndrome   . Urticaria     Tobacco History: History  Smoking Status  . Never Smoker  Smokeless Tobacco  . Never Used   Counseling given: Not  Answered   Outpatient Encounter Prescriptions as of 05/13/2016  Medication Sig  . acetaminophen (TYLENOL) 500 MG tablet Take 500 mg by mouth every 6 (six) hours as needed for moderate pain or headache.  . ALPRAZolam (XANAX) 0.25 MG tablet Take 1 tablet (0.25 mg total) by mouth 2 (two) times daily as needed for anxiety (palpitations).  Marland Kitchen aspirin EC 81 MG tablet Take 1 tablet (81 mg total) by mouth daily.  . calcium carbonate (OSCAL) 1500 (600 Ca) MG TABS tablet Take 600 mg of elemental calcium by mouth daily.  . calcium carbonate (TUMS) 500 MG chewable tablet Chew 1 tablet (200 mg of elemental calcium total) by mouth daily.  . Cholecalciferol (VITAMIN D) 1000 UNITS capsule Take 1,000 Units by mouth daily.    . Estradiol 10 MCG TABS Place 1 tablet vaginally 2 (two) times a week.   . fluticasone (FLONASE) 50 MCG/ACT nasal spray Place 2 sprays into the nose as needed for allergies or rhinitis.   . Fluticasone-Salmeterol (ADVAIR DISKUS) 100-50 MCG/DOSE AEPB Inhale 1 puff into the lungs 2 (two) times daily.  Marland Kitchen guaiFENesin (MUCINEX) 600 MG 12 hr tablet Take by mouth 2 (two) times daily.  . Lactobacillus Rhamnosus, GG, (CULTURELLE PO) Take 1 tablet by mouth daily.  Marland Kitchen loratadine (CLARITIN) 10 MG tablet Take 10 mg by mouth as directed.  . metoprolol tartrate (LOPRESSOR) 25 MG tablet Take 0.5 tablets (12.5 mg total) by mouth daily as needed (palpitations).  . Multiple Vitamins-Minerals (CENTRUM SILVER PO) Take 1 tablet by mouth 3 (three) times a week. Take 1 tablet by mouth twice a week  . ranitidine (ZANTAC) 150 MG tablet Take 150 mg by mouth 2 (two) times daily.   Marland Kitchen Respiratory Therapy Supplies (FLUTTER) DEVI Use as directed  . tobramycin-dexamethasone (TOBRADEX) ophthalmic solution Place 1 drop into  the right ear as directed.   . [DISCONTINUED] esomeprazole (NEXIUM) 40 MG capsule Take 1 capsule (40 mg total) by mouth 2 (two) times daily before a meal.   No facility-administered encounter medications on  file as of 05/13/2016.      Review of Systems  Constitutional:   No  weight loss, night sweats,  Fevers, chills, fatigue, or  lassitude.  HEENT:   No headaches,  Difficulty swallowing,  Tooth/dental problems, or  Sore throat,                No sneezing, itching, ear ache, nasal congestion, post nasal drip,   CV:  No chest pain,  Orthopnea, PND, swelling in lower extremities, anasarca, dizziness, palpitations, syncope.   GI  No heartburn, indigestion, abdominal pain, nausea, vomiting, diarrhea, change in bowel habits, loss of appetite, bloody stools.   Resp: No shortness of breath with exertion or at rest.  No excess mucus, no productive cough,  No non-productive cough,  No coughing up of blood.  No change in color of mucus.  No wheezing.  No chest wall deformity  Skin: no rash or lesions.  GU: no dysuria, change in color of urine, no urgency or frequency.  No flank pain, no hematuria   MS:  No joint pain or swelling.  No decreased range of motion.  No back pain.    Physical Exam  BP 100/60   Pulse 100   Temp 97.8 F (36.6 C) (Oral)   Ht _0  (1.575 m)   Wt 97 lb 12.8 oz (44.4 kg)   SpO2 99%   BMI 17.89 kg/m   GEN: A/Ox3; pleasant , NAD, well nourished    HEENT:  [place]/AT,  EACs-clear, TMs-wnl, NOSE-clear, THROAT-clear, no lesions, no postnasal drip or exudate noted.   NECK:  Supple w/ fair ROM; no JVD; normal carotid impulses w/o bruits; no thyromegaly or nodules palpated; no lymphadenopathy.    RESP  Clear  P & A; w/o, wheezes/ rales/ or rhonchi. no accessory muscle use, no dullness to percussion  CARD:  RRR, no m/r/g, no peripheral edema, pulses intact, no cyanosis or clubbing.  GI:   Soft & nt; nml bowel sounds; no organomegaly or masses detected.   Musco: Warm bil, no deformities or joint swelling noted.   Neuro: alert, no focal deficits noted.    Skin: Warm, no lesions or rashes  Psych:  No change in mood or affect. No depression or anxiety.  No memory  loss.  Lab Results:  CBC    Component Value Date/Time   WBC 8.6 09/17/2015 2020   RBC 4.38 09/17/2015 2020   HGB 13.5 09/17/2015 2020   HCT 39.1 09/17/2015 2020   PLT 302 09/17/2015 2020   MCV 89.3 09/17/2015 2020   MCH 30.8 09/17/2015 2020   MCHC 34.5 09/17/2015 2020   RDW 12.2 09/17/2015 2020   LYMPHSABS 1.9 09/17/2015 2020   MONOABS 0.7 09/17/2015 2020   EOSABS 0.1 09/17/2015 2020   BASOSABS 0.0 09/17/2015 2020    BMET    Component Value Date/Time   NA 136 09/17/2015 2020   K 3.6 09/17/2015 2020   CL 101 09/17/2015 2020   CO2 24 09/17/2015 2020   GLUCOSE 121 (H) 09/17/2015 2020   BUN 12 09/17/2015 2020   CREATININE 0.86 09/17/2015 2020   CALCIUM 9.2 09/17/2015 2020   GFRNONAA >60 09/17/2015 2020   GFRAA >60 09/17/2015 2020    BNP No results found for: BNP  ProBNP No  results found for: PROBNP  Imaging: Dg Chest 2 View  Result Date: 05/13/2016 CLINICAL DATA:  Followup of pneumonia with residual cough and chest pain. History of chronic lung disease and bronchiectasis. EXAM: CHEST  2 VIEW COMPARISON:  04/25/2016 FINDINGS: Nearly identical residual irregular pulmonary opacity remains in the right middle lobe. Adjacent chronic pulmonary nodularity is stable. No edema, pleural fluid or pneumothorax identified. Stable chronic lung disease elsewhere. Stable and normal heart size and mediastinal contours. The bony thorax is unremarkable. IMPRESSION: Stable irregular pulmonary opacity remaining in the right middle lobe. This could still be the residua of recent pneumonia. Continued radiographic follow-up is recommended. Electronically Signed   By: Aletta Edouard M.D.   On: 05/13/2016 15:36   Dg Chest 2 View  Result Date: 04/25/2016 CLINICAL DATA:  Four months of cough, nonsmoker. History of bronchiectasis. EXAM: CHEST  2 VIEW COMPARISON:  Chest x-ray of Sep 17, 2015 and chest CT scan of January 10, 2016. FINDINGS: The lungs are hyperinflated with increased AP dimension of  the thorax and flattening of the hemidiaphragms. There is new alveolar opacity in the right middle lobe with subtle increased density noted in the right lower lobe as well. There is stable right apical density. The heart and pulmonary vascularity are normal. The mediastinum is normal in width. There is calcification in the wall of the descending thoracic aorta. The bony thorax exhibits no acute abnormality. IMPRESSION: Acute pneumonia in the right middle lobe and to a lesser extent posterior right lower lobe. This is superimposed upon chronic interstitial disease and known bronchiectasis. Followup PA and lateral chest X-ray is recommended in 3-4 weeks following trial of antibiotic therapy to ensure resolution and exclude underlying malignancy. Electronically Signed   By: David  Martinique M.D.   On: 04/25/2016 10:24     Assessment & Plan:   No problem-specific Assessment & Plan notes found for this encounter.     Rexene Edison, NP 05/13/2016

## 2016-05-13 NOTE — Patient Instructions (Addendum)
Continue on Advair 1 puff Twice daily  , rinse after use.  Continue on Flutter valve Three times a day  .  Mucinex Twice daily  .As needed  Congestion  Continue on Zantac 150mg  Twice daily  .  Labs today  Follow up Dr. Lamonte Sakai  In 2 weeks with chest xray .  Please contact office for sooner follow up if symptoms do not improve or worsen or seek emergency care

## 2016-05-13 NOTE — Assessment & Plan Note (Signed)
Right sided PNA w/ underlying bronchiectasis +/- MAI finished abx of ceftin and zithromax.  Hold on additional abx  Follow up CXR in 2 weeks. May need CT chest going forward to follow area if no clearance.  Con tw/ mucocillary clearance with flutter valve.   Plan . Patient Instructions  Continue on Advair 1 puff Twice daily  , rinse after use.  Continue on Flutter valve Three times a day  .  Mucinex Twice daily  .As needed  Congestion  Continue on Zantac 150mg  Twice daily  .  Labs today  Follow up Dr. Lamonte Sakai  In 2 weeks with chest xray .  Please contact office for sooner follow up if symptoms do not improve or worsen or seek emergency care     '

## 2016-05-13 NOTE — Addendum Note (Signed)
Addended by: Doroteo Glassman D on: 05/13/2016 04:53 PM   Modules accepted: Orders

## 2016-05-13 NOTE — Assessment & Plan Note (Signed)
Mild flare with right sided PNA -slowly improving . 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 She has persistent sx with recent PNA  May need FOB going forward for BAL /Cx for AFB  She has follow up with Dr. Lamonte Sakai  In 2 weeks  Have her return then to discuss.  Cont on Flutter valve and mucinex

## 2016-05-27 ENCOUNTER — Ambulatory Visit (INDEPENDENT_AMBULATORY_CARE_PROVIDER_SITE_OTHER): Payer: Medicare Other | Admitting: Emergency Medicine

## 2016-05-27 ENCOUNTER — Encounter: Payer: Self-pay | Admitting: Emergency Medicine

## 2016-05-27 ENCOUNTER — Ambulatory Visit (INDEPENDENT_AMBULATORY_CARE_PROVIDER_SITE_OTHER)
Admission: RE | Admit: 2016-05-27 | Discharge: 2016-05-27 | Disposition: A | Discharge status: Home / Self Care | Payer: Medicare Other | Source: Ambulatory Visit | Attending: Adult Health | Admitting: Adult Health

## 2016-05-27 VITALS — BP 102/60 | HR 80 | Temp 97.6°F | Ht 62.0 in | Wt 100.0 lb

## 2016-05-27 DIAGNOSIS — J189 Pneumonia, unspecified organism: Secondary | ICD-10-CM

## 2016-05-27 DIAGNOSIS — J471 Bronchiectasis with (acute) exacerbation: Secondary | ICD-10-CM

## 2016-05-27 DIAGNOSIS — J181 Lobar pneumonia, unspecified organism: Secondary | ICD-10-CM

## 2016-05-27 DIAGNOSIS — R059 Cough, unspecified: Secondary | ICD-10-CM | POA: Insufficient documentation

## 2016-05-27 DIAGNOSIS — R05 Cough: Secondary | ICD-10-CM | POA: Diagnosis not present

## 2016-05-27 DIAGNOSIS — J301 Allergic rhinitis due to pollen: Secondary | ICD-10-CM

## 2016-05-27 NOTE — Patient Instructions (Addendum)
We will stop Advair to see if this helps you cough and to see if you miss it due to a change in breathing.  We will repeat your CT scan of the chest to compare with priors. Depending on the results we will determine whether other workup including bronchoscopy would be helpful.  Continue mucinex.  We will likely restart loratadine in the Spring.  Continue your GERD medications  Follow with Dr Lamonte Sakai in 1 month

## 2016-05-27 NOTE — Assessment & Plan Note (Signed)
Not currently active. She is taking a break from loratadine. She will restart in the spring.

## 2016-05-27 NOTE — Assessment & Plan Note (Signed)
We will repeat your CT scan of the chest to compare with priors. Depending on the results we will determine whether other workup including bronchoscopy would be helpful.

## 2016-05-27 NOTE — Progress Notes (Signed)
Subjective:    Patient ID: Monica [name], female    DOB: 1945/08/16, 71 y.o.   MRN: [id number]  HPI Ms. Ulibarri is a 71 year old never smoker with a history of allergic rhinitis, GERD,urticaria. She has been followed in our office by Dr. Joya Gaskins for bronchiectasis. Her last CT scan of the chest was performed in 2011 which a person reviewed. This shows probably right middle lobe bronchiectatic changes. There are some micronodular disease could be consistent with atypical mycobacterial infection although this is never been documented. She was originally dx in setting recurrent bronchitis / PNA's. Had a bronchoscopy in 2003 - cx's negative. She has flared occasionally, last time was 2 yrs ago. She has been on scheduled BD's before, none currently, none for several years.   She does have more chronic sinus disease - does NSW reliably, flonase during allergy season, zyrtec during allergy season. GERD on prilosec, zantac prn.  Her ENT is Dr Thornell Mule, remote sinus sgy. She is treated with abx fairly frequently.   Denies any current cough, dyspnea. She is active, exercises.   Acute OV 01/02/16 -- This is an acute office visit today. Patient has a history of significant bronchiectasis, chronic cough with allergic rhinitis and GERD. Also since last visit she has been diagnosed with Paroxysmal atrial fibrillation. She had some episodes of increased GERD and also some increased rhinitis last week. She is coughing more during the day, mostly clear mucous. She underwent PFT on 8/7 that I have reviewed > shows normal airflows, normal volumes and diffusion. She restarted her prilosec and zantac and is currently taking. She is uses flonase prn, not using regularly right now. She is due for a CT chest soon. She does not use BD's  ROV 02/01/16 -- This is a follow-up visit for patient with bronchiectasis and an increase in her coughing over the last 2 months. At her last visit one month ago we restarted his nasal steroid,  briefly increased her omeprazole to every day. She underwent a CT scan of the chest on 01/10/16 that I personally reviewed. This shows slight increase in his plugging in setting of bronchiectasis and some scattered stable pulmonary nodules. Increased volume loss in the right middle lobe. She states that her cough has decreased some compared with last visit. Non-productive.   ROV 05/27/16 -- This is a follow up for pt with bronchiectasis, chronic cough (GERD + rhinitis). She was treated for community-acquired pneumonia in the end of December, main sx were fatigue. He cough was about her usual baseline. She has micronodular disease on CT scan of the chest that is consistent with Mycobacterium avium although this has never been fully established. She now feels a bit better - . She continues to cough some. Her biggest complaints are up and down energy level. She is coughing up white occasionally. She is using mucinex. Not claritin right now. She remains on Advair, unclear whether it is helpful.       Review of Systems As per history of present illness  Past Medical History:  Diagnosis Date  . Adenomatous colon polyp 10/2003  . Allergic rhinitis   . Allergy    SEASONAL  . Bronchiectasis   . Chronic sinusitis   . Diverticulosis   . GERD (gastroesophageal reflux disease)   . Hearing loss   . Hemorrhoids   . Insomnia   . Menopausal disorder   . Osteoporosis   . Palpitations   . TMJ syndrome   . Urticaria  Family History  Problem Relation Age of Onset  . Rectal cancer Mother     died age 82  . Seizures Mother   . Colon cancer Mother 77  . Cancer Mother   . Heart disease Father     dies age 32  . Hypertension Father   . Heart disease Maternal Grandfather   . AVM Brother   . Cancer Maternal Grandmother   . Cancer Paternal Uncle     multiple uncles with cancer     Social History   Social History  . Marital status: Married    Spouse name: [name]  . Number of children: 0  . Years  of education: BSN   Occupational History  . retired Therapist, sports    Social History Main Topics  . Smoking status: Never Smoker  . Smokeless tobacco: Never Used  . Alcohol use 1.2 oz/week    2 Glasses of wine per week  . Drug use: No  . Sexual activity: Not on file   Other Topics Concern  . 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   Allergies  Allergen Reactions  . Amoxicillin-Pot Clavulanate Diarrhea    Has patient had a PCN reaction causing immediate rash, facial/tongue/throat swelling, SOB or lightheadedness with hypotension:No Has patient had a PCN reaction causing severe rash involving mucus membranes or skin necrosis:No Has patient had a PCN reaction that required hospitalization:No Has patient had a PCN reaction occurring within the last 10 years:Yes If all of the above answers are "NO", then may proceed with Cephalosporin use  . Avelox [Moxifloxacin Hcl In Nacl] Nausea Only  . Bactrim [Sulfamethoxazole-Trimethoprim] Other (See Comments)    flushing  . Benzonatate Other (See Comments)    felt bad, out of sorts, disoriented.  . Ciprofloxacin Rash    Rash across abdomen  . Sulfonamide Derivatives Palpitations and Other (See Comments)    Felt flushed     Outpatient Medications Prior to Visit  Medication Sig Dispense Refill  . acetaminophen (TYLENOL) 500 MG tablet Take 500 mg by mouth every 6 (six) hours as needed for moderate pain or headache.    . ALPRAZolam (XANAX) 0.25 MG tablet Take 1 tablet (0.25 mg total) by mouth 2 (two) times daily as needed for anxiety (palpitations). 60 tablet 1  . aspirin EC 81 MG tablet Take 1 tablet (81 mg total) by mouth daily. 90 tablet 3  . calcium carbonate (OSCAL) 1500 (600 Ca) MG TABS tablet Take 600 mg of elemental calcium by mouth daily.    . calcium carbonate (TUMS) 500  MG chewable tablet Chew 1 tablet (200 mg of elemental calcium total) by mouth daily. 100 tablet 3  . Cholecalciferol (VITAMIN D) 1000 UNITS capsule Take 1,000 Units by mouth daily.      . Estradiol 10 MCG TABS Place 1 tablet vaginally 2 (two) times a week.     . fluticasone (FLONASE) 50 MCG/ACT nasal spray Place 2 sprays into the nose as needed for allergies or rhinitis.     . Fluticasone-Salmeterol (ADVAIR DISKUS) 100-50 MCG/DOSE AEPB Inhale 1 puff into the lungs 2 (two) times daily. 60 each 4  . guaiFENesin (MUCINEX) 600 MG 12 hr tablet Take by mouth 2 (two) times daily.    . Lactobacillus Rhamnosus, GG, (CULTURELLE PO) Take  1 tablet by mouth daily.    Marland Kitchen loratadine (CLARITIN) 10 MG tablet Take 10 mg by mouth as directed.    . metoprolol tartrate (LOPRESSOR) 25 MG tablet Take 0.5 tablets (12.5 mg total) by mouth daily as needed (palpitations). 30 tablet 3  . Multiple Vitamins-Minerals (CENTRUM SILVER PO) Take 1 tablet by mouth 3 (three) times a week. Take 1 tablet by mouth twice a week    . ranitidine (ZANTAC) 150 MG tablet Take 150 mg by mouth 2 (two) times daily.     Marland Kitchen Respiratory Therapy Supplies (FLUTTER) DEVI Use as directed 1 each 0  . tobramycin-dexamethasone (TOBRADEX) ophthalmic solution Place 1 drop into the right ear as directed.      No facility-administered medications prior to visit.          Objective:   Physical Exam Vitals:   05/27/16 0900  BP: 102/60  Pulse: 80  Temp: 97.6 F (36.4 C)  TempSrc: Oral  SpO2: 100%  Weight: 100 lb (45.4 kg)  Height: 5' 2"  (1.575 m)   'Gen: Pleasant, thin woman, well-appearing, in no distress,  normal affect  ENT: No lesions,  mouth clear,  oropharynx clear, no postnasal drip  Neck: No JVD, no TMG, no carotid bruits  Lungs: No use of accessory muscles, clear without rales or rhonchi, no wheeze on a forced expiration.   Cardiovascular: RRR, heart sounds normal, no murmur or gallops, no peripheral edema  Musculoskeletal: No  deformities, no cyanosis or clubbing  Neuro: alert, non focal  Skin: Warm, no lesions or rashes   01/10/16 --  COMPARISON:  11/02/2009  FINDINGS: Cardiovascular: Aorta normal caliber with minimal atherosclerotic calcification. No pericardial effusion.  Mediastinum/Nodes: Few normal sized mediastinal lymph nodes. No thoracic adenopathy. Esophagus unremarkable. Base of cervical region significant only for a 5 mm RIGHT thyroid nodule.  Lungs/Pleura: Scarring, collapse, and granulomatous calcifications within the medial segment RIGHT middle lobe associated with bronchiectasis. Minimal scattered peribronchovascular slightly nodular infiltrates in both lobes favor chronic infection. 4 mm LEFT lower lobe nodule image 126, suspect slightly more anteriorly positioned previous exam mild posterior due to infiltrate volume loss in LEFT lower lobe. Minimal granulomatous change lingula with a 4 mm calcified granuloma image 73. Mild cylindrical bronchiectasis identified throughout both lungs. Scattered foci of mucous plugging in LEFT lower lobe. No segmental airspace consolidation, pleural effusion or pneumothorax. Mild scarring at apices bilaterally, stable. Subpleural nodule versus adjacent tiny nodules RIGHT lobe image 74 overall unchanged. Scattered calcified granulomata RIGHT lung. 7 mm subpleural nodule posterior RIGHT lower lobe image 91 stable. Underlying diffuse hyperinflation.  Upper Abdomen: Diverticula at splenic flexure of colon. 5 mm low-attenuation focus lateral segment LEFT lobe liver image 132 stable. Remaining visualized upper abdomen unremarkable  Musculoskeletal: No acute osseous findings.  IMPRESSION: Hyperinflated lungs with old granulomatous disease changes, bronchiectasis, scattered mucous plugging, and scattered stable pulmonary nodules.  Volume loss and scarring in RIGHT middle lobe increased since previous exam.  Scattered mild slightly nodular  peribronchovascular infiltrates in both lungs, nonspecific but suggesting chronic infection question MAC.  Colonic diverticulosis at splenic flexure of colon.  Aortic atherosclerosis        Assessment & Plan:  ALLERGIC RHINITIS, SEASONAL Not currently active. She is taking a break from loratadine. She will restart in the spring.  Bronchiectasis without acute exacerbation We will repeat your CT scan of the chest to compare with priors. Depending on the results we will determine whether other workup including bronchoscopy would be helpful.  Cough Ww will temporarily hold Advair Continue mucinex.  We will likely restart loratadine in the Spring.  Continue your GERD medications  Follow with Dr Lamonte Sakai in 1 month  Baltazar Apo, MD, PhD 05/27/2016, 9:47 AM [facility] [phone] or if no answer [phone]

## 2016-05-27 NOTE — Assessment & Plan Note (Signed)
Ww will temporarily hold Advair Continue mucinex.  We will likely restart loratadine in the Spring.  Continue your GERD medications  Follow with Dr Lamonte Sakai in 1 month

## 2016-05-28 ENCOUNTER — Encounter: Payer: Self-pay | Admitting: Emergency Medicine

## 2016-05-28 DIAGNOSIS — Z1231 Encounter for screening mammogram for malignant neoplasm of breast: Secondary | ICD-10-CM | POA: Diagnosis not present

## 2016-05-28 LAB — HM MAMMOGRAPHY

## 2016-05-29 ENCOUNTER — Encounter: Payer: Self-pay | Admitting: Emergency Medicine

## 2016-05-29 NOTE — Telephone Encounter (Signed)
There is nothing that I would recommend against taking . I suspect that they will treat her with antibiotics and possibly nasosinus hygiene / saline.

## 2016-05-29 NOTE — Telephone Encounter (Signed)
RB  Please advise- Please see pt. email

## 2016-05-30 ENCOUNTER — Encounter: Payer: Self-pay | Admitting: Nurse Practitioner

## 2016-05-30 ENCOUNTER — Ambulatory Visit (INDEPENDENT_AMBULATORY_CARE_PROVIDER_SITE_OTHER): Payer: Medicare Other | Admitting: Nurse Practitioner

## 2016-05-30 ENCOUNTER — Encounter: Payer: Self-pay | Admitting: Internal Medicine

## 2016-05-30 VITALS — BP 112/80 | HR 85 | Temp 97.5°F | Resp 16 | Ht 62.0 in | Wt 100.8 lb

## 2016-05-30 DIAGNOSIS — J0100 Acute maxillary sinusitis, unspecified: Secondary | ICD-10-CM | POA: Diagnosis not present

## 2016-05-30 MED ORDER — DOXYCYCLINE HYCLATE 100 MG PO TABS: 100.0000 mg | ORAL_TABLET | Freq: Two times a day (BID) | ORAL | 0 refills | Status: DC

## 2016-05-30 MED ORDER — OXYMETAZOLINE HCL 0.05 % NA SOLN: 1.0000 | Freq: Two times a day (BID) | NASAL | 0 refills | Status: DC

## 2016-05-30 NOTE — Progress Notes (Signed)
Subjective:  Patient ID: Monica [name], female    DOB: 03/27/1946  Age: 71 y.o. MRN: [id number]  CC: Headache (x1 month, sinus headaches )   Sinusitis  This is a chronic problem. The current episode started 1 to 4 weeks ago. The problem has been waxing and waning since onset. There has been no fever. Associated symptoms include congestion, headaches and sinus pressure. Pertinent negatives include no chills, coughing, diaphoresis, ear pain, hoarse voice, neck pain, sneezing, sore throat or swollen glands. Past treatments include acetaminophen, saline sprays and oral decongestants.    Outpatient Medications Prior to Visit  Medication Sig Dispense Refill  . acetaminophen (TYLENOL) 500 MG tablet Take 500 mg by mouth every 6 (six) hours as needed for moderate pain or headache.    . ALPRAZolam (XANAX) 0.25 MG tablet Take 1 tablet (0.25 mg total) by mouth 2 (two) times daily as needed for anxiety (palpitations). 60 tablet 1  . aspirin EC 81 MG tablet Take 1 tablet (81 mg total) by mouth daily. 90 tablet 3  . calcium carbonate (OSCAL) 1500 (600 Ca) MG TABS tablet Take 600 mg of elemental calcium by mouth daily.    . calcium carbonate (TUMS) 500 MG chewable tablet Chew 1 tablet (200 mg of elemental calcium total) by mouth daily. 100 tablet 3  . Cholecalciferol (VITAMIN D) 1000 UNITS capsule Take 1,000 Units by mouth daily.      . Estradiol 10 MCG TABS Place 1 tablet vaginally 2 (two) times a week.     . fluticasone (FLONASE) 50 MCG/ACT nasal spray Place 2 sprays into the nose as needed for allergies or rhinitis.     Marland Kitchen guaiFENesin (MUCINEX) 600 MG 12 hr tablet Take by mouth 2 (two) times daily.    . Lactobacillus Rhamnosus, GG, (CULTURELLE PO) Take 1 tablet by mouth daily.    Marland Kitchen loratadine (CLARITIN) 10 MG tablet Take 10 mg by mouth as directed.    . metoprolol tartrate (LOPRESSOR) 25 MG tablet Take 0.5 tablets (12.5 mg total) by mouth daily as needed (palpitations). 30 tablet 3  . Multiple  Vitamins-Minerals (CENTRUM SILVER PO) Take 1 tablet by mouth 3 (three) times a week. Take 1 tablet by mouth twice a week    . ranitidine (ZANTAC) 150 MG tablet Take 150 mg by mouth 2 (two) times daily.     Marland Kitchen Respiratory Therapy Supplies (FLUTTER) DEVI Use as directed 1 each 0  . tobramycin-dexamethasone (TOBRADEX) ophthalmic solution Place 1 drop into the right ear as directed.     . Fluticasone-Salmeterol (ADVAIR DISKUS) 100-50 MCG/DOSE AEPB Inhale 1 puff into the lungs 2 (two) times daily. 60 each 4   No facility-administered medications prior to visit.     ROS See HPI  Objective:  BP 112/80 (BP Location: Left Arm, Patient Position: Sitting, Cuff Size: Normal)   Pulse 85   Temp 97.5 F (36.4 C) (Oral)   Resp 16   Ht 5\' 2"  (1.575 m)   Wt 100 lb 12.8 oz (45.7 kg)   SpO2 98%   BMI 18.44 kg/m   BP Readings from Last 3 Encounters:  05/30/16 112/80  05/27/16 102/60  05/13/16 100/60    Wt Readings from Last 3 Encounters:  05/30/16 100 lb 12.8 oz (45.7 kg)  05/27/16 100 lb (45.4 kg)  05/13/16 97 lb 12.8 oz (44.4 kg)    Physical Exam  Constitutional: She is oriented to person, place, and time.  HENT:  Right Ear: Tympanic membrane, external ear and  ear canal normal.  Left Ear: Tympanic membrane, external ear and ear canal normal.  Nose: Mucosal edema and rhinorrhea present. Right sinus exhibits maxillary sinus tenderness and frontal sinus tenderness. Left sinus exhibits maxillary sinus tenderness and frontal sinus tenderness.  Mouth/Throat: Uvula is midline. No trismus in the jaw. No oropharyngeal exudate or posterior oropharyngeal erythema.  Eyes: No scleral icterus.  Neck: Normal range of motion. Neck supple.  Cardiovascular: Normal rate and normal heart sounds.   Pulmonary/Chest: Effort normal and breath sounds normal.  Musculoskeletal: She exhibits no edema.  Lymphadenopathy:    She has no cervical adenopathy.  Neurological: She is alert and oriented to person, place,  and time.  Vitals reviewed.   Lab Results  Component Value Date   WBC 10.4 05/13/2016   HGB 13.8 05/13/2016   HCT 40.3 05/13/2016   PLT 432.0 (H) 05/13/2016   GLUCOSE 98 05/13/2016   CHOL 157 04/13/2014   TRIG 71.0 04/13/2014   HDL 65.60 04/13/2014   LDLCALC 77 04/13/2014   ALT 17 09/28/2014   AST 28 09/28/2014   NA 137 05/13/2016   K 4.1 05/13/2016   CL 101 05/13/2016   CREATININE 0.70 05/13/2016   BUN 16 05/13/2016   CO2 28 05/13/2016   TSH 4.381 09/17/2015    Dg Chest 2 View  Result Date: 05/27/2016 CLINICAL DATA:  Recent pneumonia and history of chronic lung disease with bronchiectasis. EXAM: CHEST  2 VIEW COMPARISON:  05/13/2016, 04/25/2016 and 09/17/2015. CT of the chest on 01/10/2016. FINDINGS: There remains stable density and parenchymal scarring predominantly in the right middle lobe related to prior pneumonia. This is more prominent compared to a prior x-ray last May. This presumably represents residual scarring and density from pneumonia. If there are any active concerning pulmonary symptoms, CT evaluation would be helpful. Superimposed chronic lung disease with bilateral scarring and scattered bronchiectasis is stable. The lungs are mildly hyperinflated. No pneumothorax, pulmonary edema or pleural fluid identified. The heart size and mediastinal contours are within normal limits. The bony thorax is unremarkable. IMPRESSION: Persistent asymmetric density in the right middle lobe related to recent pneumonia. This is stable when compared to the prior chest x-ray on 05/13/2016 and presumably represents post inflammatory scarring. Otherwise stable chronic lung disease. If there are any active pulmonary symptoms, CT of the chest may be helpful for evaluation given the history of significant chronic lung disease. Electronically Signed   By: Aletta Edouard M.D.   On: 05/27/2016 09:01    Assessment & Plan:   Monica [name] was seen today for headache.  Diagnoses and all orders for this  visit:  Acute non-recurrent maxillary sinusitis -     oxymetazoline (AFRIN NASAL SPRAY) 0.05 % nasal spray; Place 1 spray into both nostrils 2 (two) times daily. Use only for 3days, then stop -     doxycycline (VIBRA-TABS) 100 MG tablet; Take 1 tablet (100 mg total) by mouth 2 (two) times daily.   I have discontinued Ms. Ellithorpe's Fluticasone-Salmeterol. I am also having her start on oxymetazoline and doxycycline. Additionally, I am having her maintain her Vitamin D, Estradiol, fluticasone, calcium carbonate, Multiple Vitamins-Minerals (CENTRUM SILVER PO), (Lactobacillus Rhamnosus, GG, (CULTURELLE PO)), acetaminophen, calcium carbonate, ranitidine, ALPRAZolam, tobramycin-dexamethasone, loratadine, aspirin EC, metoprolol tartrate, FLUTTER, and guaiFENesin.  Meds ordered this encounter  Medications  . oxymetazoline (AFRIN NASAL SPRAY) 0.05 % nasal spray    Sig: Place 1 spray into both nostrils 2 (two) times daily. Use only for 3days, then stop    Dispense:  30 mL    Refill:  0    Order Specific Question:   Supervising Provider    Answer:   Cassandria Anger [[id number]]  . doxycycline (VIBRA-TABS) 100 MG tablet    Sig: Take 1 tablet (100 mg total) by mouth 2 (two) times daily.    Dispense:  14 tablet    Refill:  0    Order Specific Question:   Supervising Provider    Answer:   Cassandria Anger [[id number]]    Follow-up: Return if symptoms worsen or fail to improve.  Wilfred Lacy, NP

## 2016-05-30 NOTE — Patient Instructions (Signed)
URI Instructions: Flonase and Afrin use: apply 1spray of afrin in each nare, wait 58mins, then apply 2sprays of flonase in each nare. Use both nasal spray consecutively x 3days, then flonase only for at least 14days.  Encourage adequate oral hydration.   Continue saline sinus rinse once a day.

## 2016-05-31 ENCOUNTER — Ambulatory Visit (INDEPENDENT_AMBULATORY_CARE_PROVIDER_SITE_OTHER)
Admission: RE | Admit: 2016-05-31 | Discharge: 2016-05-31 | Disposition: A | Discharge status: Home / Self Care | Payer: Medicare Other | Source: Ambulatory Visit | Attending: Emergency Medicine | Admitting: Emergency Medicine

## 2016-05-31 DIAGNOSIS — J471 Bronchiectasis with (acute) exacerbation: Secondary | ICD-10-CM | POA: Diagnosis not present

## 2016-05-31 DIAGNOSIS — J479 Bronchiectasis, uncomplicated: Secondary | ICD-10-CM | POA: Diagnosis not present

## 2016-06-05 ENCOUNTER — Encounter: Payer: Self-pay | Admitting: Internal Medicine

## 2016-06-05 ENCOUNTER — Ambulatory Visit (INDEPENDENT_AMBULATORY_CARE_PROVIDER_SITE_OTHER): Payer: Medicare Other | Admitting: Internal Medicine

## 2016-06-05 DIAGNOSIS — J329 Chronic sinusitis, unspecified: Secondary | ICD-10-CM | POA: Diagnosis not present

## 2016-06-05 DIAGNOSIS — G44319 Acute post-traumatic headache, not intractable: Secondary | ICD-10-CM

## 2016-06-05 DIAGNOSIS — F4322 Adjustment disorder with anxiety: Secondary | ICD-10-CM

## 2016-06-05 DIAGNOSIS — G4700 Insomnia, unspecified: Secondary | ICD-10-CM | POA: Diagnosis not present

## 2016-06-05 MED ORDER — SUVOREXANT 10 MG PO TABS: 10.0000 mg | ORAL_TABLET | Freq: Every evening | ORAL | 5 refills | Status: DC | PRN

## 2016-06-05 NOTE — Patient Instructions (Signed)
Valerian root at night 

## 2016-06-05 NOTE — Progress Notes (Signed)
Subjective:  Patient ID: Monica [name], female    DOB: [birth year]  Age: 71 y.o. MRN: [id number]  CC: Head pressure; Nausea (off and on); and Fatigue   HPI Monica [name] presents for cough and other URI sx's for a long time C/o RML pneumonia 6-7 wks ago - Ceftin Now is on Doxy for sinusitis - nauseated C/o HAs, insomnia for a long time. C/o stress w/Bob  Outpatient Medications Prior to Visit  Medication Sig Dispense Refill  . acetaminophen (TYLENOL) 500 MG tablet Take 500 mg by mouth every 6 (six) hours as needed for moderate pain or headache.    . ALPRAZolam (XANAX) 0.25 MG tablet Take 1 tablet (0.25 mg total) by mouth 2 (two) times daily as needed for anxiety (palpitations). 60 tablet 1  . aspirin EC 81 MG tablet Take 1 tablet (81 mg total) by mouth daily. 90 tablet 3  . calcium carbonate (OSCAL) 1500 (600 Ca) MG TABS tablet Take 600 mg of elemental calcium by mouth daily.    . calcium carbonate (TUMS) 500 MG chewable tablet Chew 1 tablet (200 mg of elemental calcium total) by mouth daily. 100 tablet 3  . Cholecalciferol (VITAMIN D) 1000 UNITS capsule Take 1,000 Units by mouth daily.      Marland Kitchen doxycycline (VIBRA-TABS) 100 MG tablet Take 1 tablet (100 mg total) by mouth 2 (two) times daily. 14 tablet 0  . Estradiol 10 MCG TABS Place 1 tablet vaginally 2 (two) times a week.     . fluticasone (FLONASE) 50 MCG/ACT nasal spray Place 2 sprays into the nose as needed for allergies or rhinitis.     Marland Kitchen guaiFENesin (MUCINEX) 600 MG 12 hr tablet Take by mouth 2 (two) times daily.    . Lactobacillus Rhamnosus, GG, (CULTURELLE PO) Take 1 tablet by mouth daily.    Marland Kitchen loratadine (CLARITIN) 10 MG tablet Take 10 mg by mouth as directed.    . metoprolol tartrate (LOPRESSOR) 25 MG tablet Take 0.5 tablets (12.5 mg total) by mouth daily as needed (palpitations). 30 tablet 3  . Multiple Vitamins-Minerals (CENTRUM SILVER PO) Take 1 tablet by mouth 3 (three) times a week. Take 1 tablet by mouth twice a week      . oxymetazoline (AFRIN NASAL SPRAY) 0.05 % nasal spray Place 1 spray into both nostrils 2 (two) times daily. Use only for 3days, then stop 30 mL 0  . ranitidine (ZANTAC) 150 MG tablet Take 150 mg by mouth 2 (two) times daily.     Marland Kitchen Respiratory Therapy Supplies (FLUTTER) DEVI Use as directed 1 each 0  . tobramycin-dexamethasone (TOBRADEX) ophthalmic solution Place 1 drop into the right ear as directed.      No facility-administered medications prior to visit.     ROS Review of Systems  Constitutional: Positive for chills and fatigue. Negative for activity change, appetite change and unexpected weight change.  HENT: Positive for congestion, rhinorrhea and sinus pain. Negative for mouth sores and sinus pressure.   Eyes: Negative for visual disturbance.  Respiratory: Positive for cough. Negative for chest tightness.   Gastrointestinal: Negative for abdominal pain and nausea.  Genitourinary: Negative for difficulty urinating, frequency and vaginal pain.  Musculoskeletal: Negative for back pain and gait problem.  Skin: Negative for pallor and rash.  Neurological: Positive for headaches. Negative for dizziness, tremors, weakness and numbness.  Psychiatric/Behavioral: Negative for confusion and sleep disturbance.    Objective:  BP 124/86 (BP Location: Left Arm, Patient Position: Sitting, Cuff Size: Normal)  Pulse (!) 106   Temp 97.4 F (36.3 C) (Oral)   Resp 18   Ht 5\' 2"  (1.575 m)   Wt 100 lb 12 oz (45.7 kg)   SpO2 97%   BMI 18.43 kg/m   BP Readings from Last 3 Encounters:  06/05/16 124/86  05/30/16 112/80  05/27/16 102/60    Wt Readings from Last 3 Encounters:  06/05/16 100 lb 12 oz (45.7 kg)  05/30/16 100 lb 12.8 oz (45.7 kg)  05/27/16 100 lb (45.4 kg)    Physical Exam  Constitutional: She appears well-developed. No distress.  HENT:  Head: Normocephalic.  Right Ear: External ear normal.  Left Ear: External ear normal.  Mouth/Throat: No oropharyngeal exudate.  Eyes:  Conjunctivae are normal. Pupils are equal, round, and reactive to light. Right eye exhibits no discharge. Left eye exhibits no discharge.  Neck: Normal range of motion. Neck supple. No JVD present. No tracheal deviation present. No thyromegaly present.  Cardiovascular: Normal rate, regular rhythm and normal heart sounds.   Pulmonary/Chest: No stridor. No respiratory distress. She has no wheezes.  Abdominal: Soft. Bowel sounds are normal. She exhibits no distension and no mass. There is no tenderness. There is no rebound and no guarding.  Musculoskeletal: She exhibits no edema or tenderness.  Lymphadenopathy:    She has no cervical adenopathy.  Neurological: She displays normal reflexes. No cranial nerve deficit. She exhibits normal muscle tone. Coordination normal.  Skin: No rash noted. No erythema.  Psychiatric: She has a normal mood and affect. Her behavior is normal. Judgment and thought content normal.  looks tired Face - NT  Lab Results  Component Value Date   WBC 10.4 05/13/2016   HGB 13.8 05/13/2016   HCT 40.3 05/13/2016   PLT 432.0 (H) 05/13/2016   GLUCOSE 98 05/13/2016   CHOL 157 04/13/2014   TRIG 71.0 04/13/2014   HDL 65.60 04/13/2014   LDLCALC 77 04/13/2014   ALT 17 09/28/2014   AST 28 09/28/2014   NA 137 05/13/2016   K 4.1 05/13/2016   CL 101 05/13/2016   CREATININE 0.70 05/13/2016   BUN 16 05/13/2016   CO2 28 05/13/2016   TSH 4.381 09/17/2015    Ct Chest High Resolution  Result Date: 05/31/2016 CLINICAL DATA:  71 year old female with history of bronchiectasis. Acute exacerbation. Recent history of pneumonia in December 2017. EXAM: CT CHEST WITHOUT CONTRAST TECHNIQUE: Multidetector CT imaging of the chest was performed following the standard protocol without intravenous contrast. High resolution imaging of the lungs, as well as inspiratory and expiratory imaging, was performed. COMPARISON:  Chest CT 01/10/2016. FINDINGS: Cardiovascular: Heart size is normal. There is  no significant pericardial fluid, thickening or pericardial calcification. Atherosclerotic calcifications in the thoracic aorta (mild). No calcifications are identified in the coronary arteries. Mediastinum/Nodes: No pathologically enlarged mediastinal or hilar lymph nodes. Please note that accurate exclusion of hilar adenopathy is limited on noncontrast CT scans. Esophagus is unremarkable in appearance. No axillary lymphadenopathy. Lungs/Pleura: High-resolution images demonstrate widespread areas of cylindrical and varicose bronchiectasis, most evident throughout the mid to lower lungs bilaterally. The areas of greatest involvement demonstrate extensive thickening of the peribronchovascular interstitium with extensive peribronchovascular micro and macronodularity, associated peribronchovascular ground-glass attenuation and regional areas of architectural distortion, all of which have increased compared to prior study 01/10/2016. Relative sparing of the upper lungs. Inspiratory and expiratory imaging is unremarkable. No confluent consolidative airspace disease. No pleural effusions. Upper Abdomen: Unremarkable. Musculoskeletal: There are no aggressive appearing lytic or blastic lesions noted  in the visualized portions of the skeleton. IMPRESSION: 1. 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 2. Aortic atherosclerosis. Electronically Signed   By: Vinnie Langton M.D.   On: 05/31/2016 15:22    Assessment & Plan:   There are no diagnoses linked to this encounter. I am having Ms. Vana maintain her Vitamin D, Estradiol, fluticasone, calcium carbonate, Multiple Vitamins-Minerals (CENTRUM SILVER PO), (Lactobacillus Rhamnosus, GG, (CULTURELLE PO)), acetaminophen, calcium carbonate, ranitidine, ALPRAZolam, tobramycin-dexamethasone, loratadine, aspirin EC,  metoprolol tartrate, FLUTTER, guaiFENesin, oxymetazoline, and doxycycline.  No orders of the defined types were placed in this encounter.    Follow-up: No Follow-up on file.  Walker Kehr, MD

## 2016-06-05 NOTE — Progress Notes (Signed)
Pre visit review using our clinic review tool, if applicable. No additional management support is needed unless otherwise documented below in the visit note. 

## 2016-06-05 NOTE — Assessment & Plan Note (Addendum)
Finish Doxy Treat insomnia  Manage stress

## 2016-06-05 NOTE — Assessment & Plan Note (Signed)
S/p 2 recent courses of abx

## 2016-06-05 NOTE — Assessment & Plan Note (Signed)
Try Belsomra 

## 2016-06-05 NOTE — Assessment & Plan Note (Signed)
Xanax prn - rare 

## 2016-06-10 ENCOUNTER — Encounter: Payer: Self-pay | Admitting: Emergency Medicine

## 2016-06-10 ENCOUNTER — Encounter: Payer: Self-pay | Admitting: Internal Medicine

## 2016-06-10 NOTE — Telephone Encounter (Signed)
I think she will need an OV for [place] to sort this all out - A fib vs nasosinus sx vs other causes of SOB. Please have her schedule to be seen in office. Thanks.

## 2016-06-10 NOTE — Telephone Encounter (Signed)
Patient scheduled for appt with TP on 06/12/16 at 11am.  Nothing further needed.

## 2016-06-10 NOTE — Telephone Encounter (Signed)
Dr Lamonte Sakai, please see e-mail below, pt states that she is not improving and is complaining of worsening symptoms.   To: Collene Gobble., MD Subject: Visit Follow-Up Question  Feeling more worn out, with lots of nausea. Had a 10 minute episode of  AFib Saturday night. Had had 2 episodes over the holidays less than 10 minute duration each. Now have nasal bleeding which had been mild but now worsening. Using twice/day nasal washes, saline nasal spray prn, and small amounts of bacitracin to an ulcer in right nostril which probably came from using Flonase for my sinusitis. Feel I am getting worse, not better. Occasional non-productive cough as I have had. Please advise. Also sent message to Dr Alain Marion who I saw last Wednesday, to bring him up to speed in what is going on and to check my sinusitis which had been treated the previous week with doxycycline by one of the nurse practitioners Thx  If Dr Lamonte Sakai not in today to give previous message to, please give it to Tammy, thx    Pt requests for recommendations specifically from Dr Lamonte Sakai or Rexene Edison, NP.

## 2016-06-10 NOTE — Telephone Encounter (Signed)
Please see other e-mail message for 06/10/16.  This encounter will be closed as all details are in other message.

## 2016-06-12 ENCOUNTER — Ambulatory Visit (INDEPENDENT_AMBULATORY_CARE_PROVIDER_SITE_OTHER): Payer: Medicare Other | Admitting: Adult Health

## 2016-06-12 ENCOUNTER — Encounter: Payer: Self-pay | Admitting: Adult Health

## 2016-06-12 ENCOUNTER — Encounter: Payer: Self-pay | Admitting: Cardiovascular Disease

## 2016-06-12 DIAGNOSIS — J471 Bronchiectasis with (acute) exacerbation: Secondary | ICD-10-CM | POA: Diagnosis not present

## 2016-06-12 NOTE — Progress Notes (Signed)
_0  ID: Monica [name], female    DOB: 1945/08/20, 71 y.o.   MRN: [id number]  Chief Complaint  Patient presents with  . Acute Visit    bronchiectais     Referring provider: Cassandria Anger, MD  HPI: 71 yo female never smoker followed for AR , Urticaria, and Bronchiectasis   TEST  01/2016 CT chest >Hyperinflated lungs with old granulomatous disease changes, bronchiectasis, scattered mucous plugging, and scattered stable pulmonary nodules.Volume loss and scarring in RIGHT middle lobe increased since previous exam.Scattered mild slightly nodular peribronchovascular infiltrates in both lungs, nonspecific but suggesting chronic infection question MAC.  06/12/2016 Acute OV  Pt returns for an acute office visit. She continues to have persistent symptoms of cough, congestion, malaise.  She has had a progression of symptoms over last few months with cough , malaise . She was treated for PNA in Dec , she was treated with abx but never felt she fully recovered. Seen 2 weeks ago, CT chest was done that showed worsening bronchiectasis with area of chronic mucoid impaction, contiues to suggest MAI. She has been unable to cough up any mucus for sputum sample to send off.  She uses her flutter valve at least 2 x day . Takes mucinex Twice daily  .  She denies fever, or hemoptysis . Wt has been slowly trending down couple of pounds.     Allergies  Allergen Reactions  . Amoxicillin-Pot Clavulanate Diarrhea    Has patient had a PCN reaction causing immediate rash, facial/tongue/throat swelling, SOB or lightheadedness with hypotension:No Has patient had a PCN reaction causing severe rash involving mucus membranes or skin necrosis:No Has patient had a PCN reaction that required hospitalization:No Has patient had a PCN reaction occurring within the last 10 years:Yes If all of the above answers are "NO", then may proceed with Cephalosporin use  . Avelox [Moxifloxacin Hcl In Nacl] Nausea Only   . Bactrim [Sulfamethoxazole-Trimethoprim] Other (See Comments)    flushing  . Benzonatate Other (See Comments)    felt bad, out of sorts, disoriented.  . Ciprofloxacin Rash    Rash across abdomen  . Sulfonamide Derivatives Palpitations and Other (See Comments)    Felt flushed    Immunization History  Administered Date(s) Administered  . Influenza Split 02/25/2011, 02/05/2012  . Influenza Whole 03/02/2008, 03/02/2009, 02/06/2010  . Influenza,inj,Quad PF,36+ Mos 02/04/2013, 01/24/2014, 02/02/2015, 02/01/2016  . Pneumococcal Conjugate-13 02/16/2013  . Pneumococcal Polysaccharide-23 03/11/2003, 01/16/2009, 09/26/2014  . Td 06/06/2001  . Tdap 07/17/2012  . Zoster 05/07/2007    Past Medical History:  Diagnosis Date  . Adenomatous colon polyp 10/2003  . Allergic rhinitis   . Allergy    SEASONAL  . Bronchiectasis   . Chronic sinusitis   . Diverticulosis   . GERD (gastroesophageal reflux disease)   . Hearing loss   . Hemorrhoids   . Insomnia   . Menopausal disorder   . Osteoporosis   . Palpitations   . TMJ syndrome   . Urticaria     Tobacco History: History  Smoking Status  . Never Smoker  Smokeless Tobacco  . Never Used   Counseling given: Not Answered   Outpatient Encounter Prescriptions as of 06/12/2016  Medication Sig  . acetaminophen (TYLENOL) 500 MG tablet Take 500 mg by mouth every 6 (six) hours as needed for moderate pain or headache.  . ALPRAZolam (XANAX) 0.25 MG tablet Take 1 tablet (0.25 mg total) by mouth 2 (two) times daily as needed for anxiety (palpitations).  Marland Kitchen  aspirin EC 81 MG tablet Take 1 tablet (81 mg total) by mouth daily.  . calcium carbonate (OSCAL) 1500 (600 Ca) MG TABS tablet Take 600 mg of elemental calcium by mouth daily.  . calcium carbonate (TUMS) 500 MG chewable tablet Chew 1 tablet (200 mg of elemental calcium total) by mouth daily.  . Cholecalciferol (VITAMIN D) 1000 UNITS capsule Take 1,000 Units by mouth daily.    . Estradiol 10 MCG  TABS Place 1 tablet vaginally 2 (two) times a week.   . fluticasone (FLONASE) 50 MCG/ACT nasal spray Place 2 sprays into the nose as needed for allergies or rhinitis.   Marland Kitchen guaiFENesin (MUCINEX) 600 MG 12 hr tablet Take by mouth 2 (two) times daily.  . Lactobacillus Rhamnosus, GG, (CULTURELLE PO) Take 1 tablet by mouth daily.  Marland Kitchen loratadine (CLARITIN) 10 MG tablet Take 10 mg by mouth as directed.  . metoprolol tartrate (LOPRESSOR) 25 MG tablet Take 0.5 tablets (12.5 mg total) by mouth daily as needed (palpitations).  . Multiple Vitamins-Minerals (CENTRUM SILVER PO) Take 1 tablet by mouth 3 (three) times a week. Take 1 tablet by mouth twice a week  . oxymetazoline (AFRIN NASAL SPRAY) 0.05 % nasal spray Place 1 spray into both nostrils 2 (two) times daily. Use only for 3days, then stop  . ranitidine (ZANTAC) 150 MG tablet Take 150 mg by mouth 2 (two) times daily.   Marland Kitchen Respiratory Therapy Supplies (FLUTTER) DEVI Use as directed  . Suvorexant (BELSOMRA) 10 MG TABS Take 10 mg by mouth at bedtime as needed.  . tobramycin-dexamethasone (TOBRADEX) ophthalmic solution Place 1 drop into the right ear as directed.    No facility-administered encounter medications on file as of 06/12/2016.      Review of Systems  Constitutional:   No  weight loss, night sweats,  Fevers, chills, fatigue, or  lassitude.  HEENT:   No headaches,  Difficulty swallowing,  Tooth/dental problems, or  Sore throat,                No sneezing, itching, ear ache,  +nasal congestion, post nasal drip,   CV:  No chest pain,  Orthopnea, PND, swelling in lower extremities, anasarca, dizziness, palpitations, syncope.   GI  No heartburn, indigestion, abdominal pain, nausea, vomiting, diarrhea, change in bowel habits, loss of appetite, bloody stools.   Resp:   No wheezing.  No chest wall deformity  Skin: no rash or lesions.  GU: no dysuria, change in color of urine, no urgency or frequency.  No flank pain, no hematuria   MS:  No joint  pain or swelling.  No decreased range of motion.  No back pain.    Physical Exam  BP (!) 112/56 (BP Location: Left Arm, Cuff Size: Normal)   Pulse 93   Ht _0  (1.575 m)   Wt 99 lb 12.8 oz (45.3 kg)   SpO2 99%   BMI 18.25 kg/m   GEN: A/Ox3; pleasant , NAD, thin frail    HEENT:  [place]/AT,  EACs-clear, TMs-wnl, NOSE-clear, THROAT-clear, no lesions, no postnasal drip or exudate noted.   NECK:  Supple w/ fair ROM; no JVD; normal carotid impulses w/o bruits; no thyromegaly or nodules palpated; no lymphadenopathy.    RESP  Few trace rhonchi  no accessory muscle use, no dullness to percussion  CARD:  RRR, no m/r/g, no peripheral edema, pulses intact, no cyanosis or clubbing.  GI:   Soft & nt; nml bowel sounds; no organomegaly or masses detected.  Musco: Warm bil, no deformities or joint swelling noted.   Neuro: alert, no focal deficits noted.    Skin: Warm, no lesions or rashes  Lab Results:  CBC    Component Value Date/Time   WBC 10.4 05/13/2016 1634   RBC 4.60 05/13/2016 1634   HGB 13.8 05/13/2016 1634   HCT 40.3 05/13/2016 1634   PLT 432.0 (H) 05/13/2016 1634   MCV 87.7 05/13/2016 1634   MCH 30.8 09/17/2015 2020   MCHC 34.3 05/13/2016 1634   RDW 13.3 05/13/2016 1634   LYMPHSABS 1.3 05/13/2016 1634   MONOABS 0.6 05/13/2016 1634   EOSABS 0.1 05/13/2016 1634   BASOSABS 0.1 05/13/2016 1634    BMET    Component Value Date/Time   NA 137 05/13/2016 1634   K 4.1 05/13/2016 1634   CL 101 05/13/2016 1634   CO2 28 05/13/2016 1634   GLUCOSE 98 05/13/2016 1634   BUN 16 05/13/2016 1634   CREATININE 0.70 05/13/2016 1634   CALCIUM 9.3 05/13/2016 1634   GFRNONAA >60 09/17/2015 2020   GFRAA >60 09/17/2015 2020    BNP No results found for: BNP  ProBNP No results found for: PROBNP  Imaging: Dg Chest 2 View  Result Date: 05/27/2016 CLINICAL DATA:  Recent pneumonia and history of chronic lung disease with bronchiectasis. EXAM: CHEST  2 VIEW COMPARISON:  05/13/2016,  04/25/2016 and 09/17/2015. CT of the chest on 01/10/2016. FINDINGS: There remains stable density and parenchymal scarring predominantly in the right middle lobe related to prior pneumonia. This is more prominent compared to a prior x-ray last May. This presumably represents residual scarring and density from pneumonia. If there are any active concerning pulmonary symptoms, CT evaluation would be helpful. Superimposed chronic lung disease with bilateral scarring and scattered bronchiectasis is stable. The lungs are mildly hyperinflated. No pneumothorax, pulmonary edema or pleural fluid identified. The heart size and mediastinal contours are within normal limits. The bony thorax is unremarkable. IMPRESSION: Persistent asymmetric density in the right middle lobe related to recent pneumonia. This is stable when compared to the prior chest x-ray on 05/13/2016 and presumably represents post inflammatory scarring. Otherwise stable chronic lung disease. If there are any active pulmonary symptoms, CT of the chest may be helpful for evaluation given the history of significant chronic lung disease. Electronically Signed   By: Aletta Edouard M.D.   On: 05/27/2016 09:01   Dg Chest 2 View  Result Date: 05/13/2016 CLINICAL DATA:  Followup of pneumonia with residual cough and chest pain. History of chronic lung disease and bronchiectasis. EXAM: CHEST  2 VIEW COMPARISON:  04/25/2016 FINDINGS: Nearly identical residual irregular pulmonary opacity remains in the right middle lobe. Adjacent chronic pulmonary nodularity is stable. No edema, pleural fluid or pneumothorax identified. Stable chronic lung disease elsewhere. Stable and normal heart size and mediastinal contours. The bony thorax is unremarkable. IMPRESSION: Stable irregular pulmonary opacity remaining in the right middle lobe. This could still be the residua of recent pneumonia. Continued radiographic follow-up is recommended. Electronically Signed   By: Aletta Edouard  M.D.   On: 05/13/2016 15:36   Ct Chest High Resolution  Result Date: 05/31/2016 CLINICAL DATA:  71 year old female with history of bronchiectasis. Acute exacerbation. Recent history of pneumonia in December 2017. EXAM: CT CHEST WITHOUT CONTRAST TECHNIQUE: Multidetector CT imaging of the chest was performed following the standard protocol without intravenous contrast. High resolution imaging of the lungs, as well as inspiratory and expiratory imaging, was performed. COMPARISON:  Chest CT 01/10/2016. FINDINGS: Cardiovascular:  Heart size is normal. There is no significant pericardial fluid, thickening or pericardial calcification. Atherosclerotic calcifications in the thoracic aorta (mild). No calcifications are identified in the coronary arteries. Mediastinum/Nodes: No pathologically enlarged mediastinal or hilar lymph nodes. Please note that accurate exclusion of hilar adenopathy is limited on noncontrast CT scans. Esophagus is unremarkable in appearance. No axillary lymphadenopathy. Lungs/Pleura: High-resolution images demonstrate widespread areas of cylindrical and varicose bronchiectasis, most evident throughout the mid to lower lungs bilaterally. The areas of greatest involvement demonstrate extensive thickening of the peribronchovascular interstitium with extensive peribronchovascular micro and macronodularity, associated peribronchovascular ground-glass attenuation and regional areas of architectural distortion, all of which have increased compared to prior study 01/10/2016. Relative sparing of the upper lungs. Inspiratory and expiratory imaging is unremarkable. No confluent consolidative airspace disease. No pleural effusions. Upper Abdomen: Unremarkable. Musculoskeletal: There are no aggressive appearing lytic or blastic lesions noted in the visualized portions of the skeleton. IMPRESSION: 1. Worsening areas of bronchiectasis and associated areas of chronic mucoid impaction, as above, suggestive of a  chronic indolent atypical infectious process such as MAI (mycobacterium avium intracellulare). 2. Aortic atherosclerosis. Electronically Signed   By: Vinnie Langton M.D.   On: 05/31/2016 15:22     Assessment & Plan:   No problem-specific Assessment & Plan notes found for this encounter.     Rexene Edison, NP 06/12/2016

## 2016-06-12 NOTE — Patient Instructions (Signed)
Continue on Flutter valve Two to three times a day  .  Mucinex Twice daily  .As needed  Congestion  Continue on Zantac 150mg  Twice daily  .  I will be in touch in next couple of days about further testing. Call me back if you have not heard back by 05/17/16.  Follow up Dr. Lamonte Sakai  In as planned this month.  Please contact office for sooner follow up if symptoms do not improve or worsen or seek emergency care

## 2016-06-17 ENCOUNTER — Encounter: Payer: Self-pay | Admitting: Adult Health

## 2016-06-17 DIAGNOSIS — H6981 Other specified disorders of Eustachian tube, right ear: Secondary | ICD-10-CM | POA: Diagnosis not present

## 2016-06-17 DIAGNOSIS — H6121 Impacted cerumen, right ear: Secondary | ICD-10-CM | POA: Diagnosis not present

## 2016-06-17 DIAGNOSIS — H903 Sensorineural hearing loss, bilateral: Secondary | ICD-10-CM | POA: Diagnosis not present

## 2016-06-17 DIAGNOSIS — J309 Allergic rhinitis, unspecified: Secondary | ICD-10-CM | POA: Diagnosis not present

## 2016-06-18 NOTE — Telephone Encounter (Signed)
TP- see below email  ------------------- Tammy, 2 items 1. You told me to remind you, if I haven't heard back from you by today,about further testing for me  2. Saw Dr Thornell Mule today because my ear tube was plugged. I had him look into my R nostril as I had bloody drainage again this morning. He gave me Rx of mupiricin ointment as I still had ulcer on my septum. He also gave me a small bottle of saline mist & said to put some Mupiricin ointment in it , shake it up and spray into my nostrils bid for a week. The Mupiricin I got from [facility] is in a base of polyethylene glycol. I didn't think to ask him about this before I left. Would using this mixture compromise my bronchiectasis-all I could think of was lipid pneumonia because of the ointment base. So please advise about this use or not -----------------------  TP please advise.  Thanks!

## 2016-06-19 ENCOUNTER — Encounter: Payer: Self-pay | Admitting: Cardiovascular Disease

## 2016-06-19 ENCOUNTER — Telehealth: Payer: Self-pay | Admitting: Adult Health

## 2016-06-19 NOTE — Telephone Encounter (Signed)
Patient is scheduled to see Monica [name] on 07/02/16.  Will close encounter.

## 2016-06-19 NOTE — Telephone Encounter (Signed)
Please call pt and let her know that Dr. Lamonte Sakai  Would like to see her in office . Please set her up on his schedule , thanks .

## 2016-06-20 NOTE — Assessment & Plan Note (Signed)
Progressive symptoms w/ frequent flares  Continue with mucocilliary clearance devices Discussed VEST therapy , will address again on return  Case discussed with Dr. Lamonte Sakai  And pt will come in with him to review CT chest and decide to proceed with bronchoscopy.

## 2016-07-02 ENCOUNTER — Ambulatory Visit (INDEPENDENT_AMBULATORY_CARE_PROVIDER_SITE_OTHER): Payer: Medicare Other | Admitting: Emergency Medicine

## 2016-07-02 ENCOUNTER — Encounter: Payer: Self-pay | Admitting: Emergency Medicine

## 2016-07-02 DIAGNOSIS — J471 Bronchiectasis with (acute) exacerbation: Secondary | ICD-10-CM

## 2016-07-02 DIAGNOSIS — J301 Allergic rhinitis due to pollen: Secondary | ICD-10-CM

## 2016-07-02 NOTE — Assessment & Plan Note (Signed)
Please restart your loratadine every day.  Please restart your flonase 2 sprays each nostril daily through the allergy season.

## 2016-07-02 NOTE — Progress Notes (Signed)
Subjective:    Patient ID: Monica [name], female    DOB: 1945-05-17, 71 y.o.   MRN: [id number]  HPI Monica [name] is a 71 year old never smoker with a history of allergic rhinitis, GERD,urticaria. She has been followed in our office by Dr. Joya Gaskins for bronchiectasis. Her last CT scan of the chest was performed in 2011 which a person reviewed. This shows probably right middle lobe bronchiectatic changes. There are some micronodular disease could be consistent with atypical mycobacterial infection although this is never been documented. She was originally dx in setting recurrent bronchitis / PNA's. Had a bronchoscopy in 2003 - cx's negative. She has flared occasionally, last time was 2 yrs ago. She has been on scheduled BD's before, none currently, none for several years.   She does have more chronic sinus disease - does NSW reliably, flonase during allergy season, zyrtec during allergy season. GERD on prilosec, zantac prn.  Her ENT is Dr Thornell Mule, remote sinus sgy. She is treated with abx fairly frequently.   Denies any current cough, dyspnea. She is active, exercises.   Acute OV 01/02/16 -- This is an acute office visit today. Patient has a history of significant bronchiectasis, chronic cough with allergic rhinitis and GERD. Also since last visit she has been diagnosed with Paroxysmal atrial fibrillation. She had some episodes of increased GERD and also some increased rhinitis last week. She is coughing more during the day, mostly clear mucous. She underwent PFT on 8/7 that I have reviewed > shows normal airflows, normal volumes and diffusion. She restarted her prilosec and zantac and is currently taking. She is uses flonase prn, not using regularly right now. She is due for a CT chest soon. She does not use BD's  ROV 02/01/16 -- This is a follow-up visit for patient with bronchiectasis and an increase in her coughing over the last 2 months. At her last visit one month ago we restarted his nasal steroid,  briefly increased her omeprazole to every day. She underwent a CT scan of the chest on 01/10/16 that I personally reviewed. This shows slight increase in his plugging in setting of bronchiectasis and some scattered stable pulmonary nodules. Increased volume loss in the right middle lobe. She states that her cough has decreased some compared with last visit. Non-productive.   ROV 05/27/16 -- This is a follow up for pt with bronchiectasis, chronic cough (GERD + rhinitis). She was treated for community-acquired pneumonia in the end of December, main sx were fatigue. He cough was about her usual baseline. She has micronodular disease on CT scan of the chest that is consistent with Mycobacterium avium although this has never been fully established. She now feels a bit better - . She continues to cough some. Her biggest complaints are up and down energy level. She is coughing up white occasionally. She is using mucinex. Not claritin right now. She remains on Advair, unclear whether it is helpful.    ROV 07/02/16 -- patient has a history of bronchiectasis, chronic cough, GERD, rhinitis. She's continued to have guilty with cough. A CT scan of her chest was performed last 26/18 that I personally reviewed. I discussed this scan with T Parrett (whom the patient has seen more recently) > shows progression of some bronchiectasis, suspicion for [facility]. Her cough is finally improved some over the last 2 weeks. More energy. Cough remains dry. She is back on flutter valve but non- productive.  She has loratadine and flonase available, not yet back on it.  Review of Systems As per history of present illness     Objective:   Physical Exam Vitals:   07/02/16 1438  BP: 108/70  Pulse: 90  SpO2: 97%  Weight: 100 lb 9.6 oz (45.6 kg)  Height: 5' 2"  (1.575 m)   'Gen: Pleasant, thin woman, well-appearing, in no distress,  normal affect  ENT: No lesions,  mouth clear,  oropharynx clear, no postnasal drip  Neck: No JVD, no TMG, no carotid bruits  Lungs: No use of accessory muscles, clear without rales or rhonchi, no wheeze   Cardiovascular: RRR, heart sounds normal, no murmur or gallops, no peripheral edema  Musculoskeletal: No deformities, no cyanosis or clubbing  Neuro: alert, non focal  Skin: Warm, no lesions or rashes   01/10/16 --  COMPARISON:  11/02/2009  FINDINGS: Cardiovascular: Aorta normal caliber with minimal atherosclerotic calcification. No pericardial effusion.  Mediastinum/Nodes: Few normal sized mediastinal lymph nodes. No thoracic adenopathy. Esophagus unremarkable. Base of cervical region significant only for a 5 mm RIGHT thyroid nodule.  Lungs/Pleura: Scarring, collapse, and granulomatous calcifications within the medial segment RIGHT middle lobe associated with bronchiectasis. Minimal scattered peribronchovascular slightly nodular infiltrates in both lobes favor chronic infection. 4 mm LEFT lower lobe nodule image 126, suspect slightly more anteriorly positioned previous exam mild posterior due to infiltrate volume loss in LEFT lower lobe. Minimal granulomatous change lingula with a 4 mm calcified granuloma image 73. Mild  cylindrical bronchiectasis identified throughout both lungs. Scattered foci of mucous plugging in LEFT lower lobe. No segmental airspace consolidation, pleural effusion or pneumothorax. Mild scarring at apices bilaterally, stable. Subpleural nodule versus adjacent tiny nodules RIGHT lobe image 74 overall unchanged. Scattered calcified granulomata RIGHT lung. 7 mm subpleural nodule posterior RIGHT lower lobe image 91 stable. Underlying diffuse hyperinflation.  Upper Abdomen: Diverticula at splenic flexure of colon. 5 mm low-attenuation focus lateral segment LEFT lobe liver image 132 stable. Remaining visualized upper abdomen unremarkable  Musculoskeletal: No acute osseous findings.  IMPRESSION: Hyperinflated lungs with old granulomatous disease changes, bronchiectasis, scattered mucous plugging, and scattered stable pulmonary nodules.  Volume loss and scarring in RIGHT middle lobe increased since previous exam.  Scattered mild slightly nodular peribronchovascular infiltrates in both lungs, nonspecific but suggesting chronic infection question MAC.  Colonic diverticulosis at splenic flexure of colon.  Aortic atherosclerosis        Assessment & Plan:  Bronchiectasis without acute exacerbation We will plan to repeat a bronchoscopy in the near future to get culture information Please restart your loratadine every day.  Please restart your flonase 2 sprays each nostril daily through the allergy season.  Continue your flutter valve twice a day.  We will hold of on chest vest for now.  You may stop your mucinex.  We will not restart Advair for now.  Follow with Dr Lamonte Sakai next available after your bronchscopy to review your culture information.   ALLERGIC RHINITIS, SEASONAL Please restart your loratadine every day.  Please restart your flonase 2 sprays each nostril daily through the allergy season.   Baltazar Apo, MD, PhD 07/02/2016, 3:11 PM [facility] [phone] or if no answer [phone]

## 2016-07-02 NOTE — Patient Instructions (Addendum)
We will plan to repeat a bronchoscopy in the near future to get culture information Please restart your loratadine every day.  Please restart your flonase 2 sprays each nostril daily through the allergy season.  Continue your flutter valve twice a day.  We will hold of on chest vest for now.  You may stop your mucinex.  We will not restart Advair for now.  Follow with Dr Lamonte Sakai next available after your bronchscopy to review your culture information.

## 2016-07-02 NOTE — Assessment & Plan Note (Signed)
We will plan to repeat a bronchoscopy in the near future to get culture information Please restart your loratadine every day.  Please restart your flonase 2 sprays each nostril daily through the allergy season.  Continue your flutter valve twice a day.  We will hold of on chest vest for now.  You may stop your mucinex.  We will not restart Advair for now.  Follow with Dr Lamonte Sakai next available after your bronchscopy to review your culture information.

## 2016-07-03 ENCOUNTER — Encounter: Payer: Self-pay | Admitting: Internal Medicine

## 2016-07-03 ENCOUNTER — Other Ambulatory Visit: Payer: Medicare Other

## 2016-07-03 ENCOUNTER — Ambulatory Visit (INDEPENDENT_AMBULATORY_CARE_PROVIDER_SITE_OTHER): Payer: Medicare Other | Admitting: Internal Medicine

## 2016-07-03 VITALS — BP 110/62 | HR 80 | Temp 97.5°F | Wt 101.0 lb

## 2016-07-03 DIAGNOSIS — Z7289 Other problems related to lifestyle: Secondary | ICD-10-CM

## 2016-07-03 DIAGNOSIS — G44319 Acute post-traumatic headache, not intractable: Secondary | ICD-10-CM

## 2016-07-03 DIAGNOSIS — J471 Bronchiectasis with (acute) exacerbation: Secondary | ICD-10-CM

## 2016-07-03 DIAGNOSIS — R059 Cough, unspecified: Secondary | ICD-10-CM

## 2016-07-03 DIAGNOSIS — R05 Cough: Secondary | ICD-10-CM

## 2016-07-03 DIAGNOSIS — G4700 Insomnia, unspecified: Secondary | ICD-10-CM | POA: Diagnosis not present

## 2016-07-03 NOTE — Progress Notes (Signed)
Pre visit review using our clinic review tool, if applicable. No additional management support is needed unless otherwise documented below in the visit note. 

## 2016-07-03 NOTE — Assessment & Plan Note (Signed)
Bronchoscopy pending

## 2016-07-03 NOTE — Patient Instructions (Signed)
Valerian root 300-600 mg at night 

## 2016-07-03 NOTE — Assessment & Plan Note (Signed)
Valerian root 300-600 mg at night 

## 2016-07-03 NOTE — Progress Notes (Signed)
Subjective:  Patient ID: Monica [name], female    DOB: June 03, 1945  Age: 71 y.o. MRN: [id number]  CC: No chief complaint on file.   HPI Monica [name] presents for HAs and nausea - better F/u insomnia F/u bronchiectases - bronchoscopy pending  Outpatient Medications Prior to Visit  Medication Sig Dispense Refill  . acetaminophen (TYLENOL) 500 MG tablet Take 500 mg by mouth every 6 (six) hours as needed for moderate pain or headache.    . ALPRAZolam (XANAX) 0.25 MG tablet Take 1 tablet (0.25 mg total) by mouth 2 (two) times daily as needed for anxiety (palpitations). 60 tablet 1  . aspirin EC 81 MG tablet Take 1 tablet (81 mg total) by mouth daily. 90 tablet 3  . calcium carbonate (OSCAL) 1500 (600 Ca) MG TABS tablet Take 600 mg of elemental calcium by mouth daily.    . calcium carbonate (TUMS) 500 MG chewable tablet Chew 1 tablet (200 mg of elemental calcium total) by mouth daily. 100 tablet 3  . Cholecalciferol (VITAMIN D) 1000 UNITS capsule Take 1,000 Units by mouth daily.      . Estradiol 10 MCG TABS Place 1 tablet vaginally 2 (two) times a week.     . fluticasone (FLONASE) 50 MCG/ACT nasal spray Place 2 sprays into the nose as needed for allergies or rhinitis.     Marland Kitchen guaiFENesin (MUCINEX) 600 MG 12 hr tablet Take by mouth 2 (two) times daily.    . Lactobacillus Rhamnosus, GG, (CULTURELLE PO) Take 1 tablet by mouth daily.    Marland Kitchen loratadine (CLARITIN) 10 MG tablet Take 10 mg by mouth as directed.    . metoprolol tartrate (LOPRESSOR) 25 MG tablet Take 0.5 tablets (12.5 mg total) by mouth daily as needed (palpitations). 30 tablet 3  . Multiple Vitamins-Minerals (CENTRUM SILVER PO) Take 1 tablet by mouth 3 (three) times a week. Take 1 tablet by mouth twice a week    . mupirocin ointment (BACTROBAN) 2 % as needed.    Marland Kitchen oxymetazoline (AFRIN NASAL SPRAY) 0.05 % nasal spray Place 1 spray into both nostrils 2 (two) times daily. Use only for 3days, then stop 30 mL 0  . ranitidine (ZANTAC) 150  MG tablet Take 150 mg by mouth 2 (two) times daily.     Marland Kitchen Respiratory Therapy Supplies (FLUTTER) DEVI Use as directed 1 each 0  . Suvorexant (BELSOMRA) 10 MG TABS Take 10 mg by mouth at bedtime as needed. 30 tablet 5  . tobramycin-dexamethasone (TOBRADEX) ophthalmic solution Place 1 drop into the right ear as directed.      No facility-administered medications prior to visit.     ROS Review of Systems  Constitutional: Negative for activity change, appetite change, chills, fatigue and unexpected weight change.  HENT: Negative for congestion, mouth sores and sinus pressure.   Eyes: Negative for visual disturbance.  Respiratory: Positive for cough. Negative for chest tightness.   Gastrointestinal: Negative for abdominal pain and nausea.  Genitourinary: Negative for difficulty urinating, frequency and vaginal pain.  Musculoskeletal: Negative for back pain and gait problem.  Skin: Negative for pallor and rash.  Neurological: Negative for dizziness, tremors, weakness, numbness and headaches.  Psychiatric/Behavioral: Positive for sleep disturbance. Negative for confusion.    Objective:  BP 110/62   Pulse 80   Temp 97.5 F (36.4 C)   Wt 101 lb (45.8 kg)   SpO2 95%   BMI 18.47 kg/m   BP Readings from Last 3 Encounters:  07/03/16 110/62  07/02/16  108/70  06/12/16 (!) 112/56    Wt Readings from Last 3 Encounters:  07/03/16 101 lb (45.8 kg)  07/02/16 100 lb 9.6 oz (45.6 kg)  06/12/16 99 lb 12.8 oz (45.3 kg)    Physical Exam  Constitutional: She appears well-developed. No distress.  HENT:  Head: Normocephalic.  Right Ear: External ear normal.  Left Ear: External ear normal.  Nose: Nose normal.  Mouth/Throat: Oropharynx is clear and moist.  Eyes: Conjunctivae are normal. Pupils are equal, round, and reactive to light. Right eye exhibits no discharge. Left eye exhibits no discharge.  Neck: Normal range of motion. Neck supple. No JVD present. No tracheal deviation present. No  thyromegaly present.  Cardiovascular: Normal rate, regular rhythm and normal heart sounds.   Pulmonary/Chest: No stridor. No respiratory distress. She has no wheezes.  Abdominal: Soft. Bowel sounds are normal. She exhibits no distension and no mass. There is no tenderness. There is no rebound and no guarding.  Musculoskeletal: She exhibits no edema or tenderness.  Lymphadenopathy:    She has no cervical adenopathy.  Neurological: She displays normal reflexes. No cranial nerve deficit. She exhibits normal muscle tone. Coordination normal.  Skin: No rash noted. No erythema.  Psychiatric: Her behavior is normal. Judgment and thought content normal.    Lab Results  Component Value Date   WBC 10.4 05/13/2016   HGB 13.8 05/13/2016   HCT 40.3 05/13/2016   PLT 432.0 (H) 05/13/2016   GLUCOSE 98 05/13/2016   CHOL 157 04/13/2014   TRIG 71.0 04/13/2014   HDL 65.60 04/13/2014   LDLCALC 77 04/13/2014   ALT 17 09/28/2014   AST 28 09/28/2014   NA 137 05/13/2016   K 4.1 05/13/2016   CL 101 05/13/2016   CREATININE 0.70 05/13/2016   BUN 16 05/13/2016   CO2 28 05/13/2016   TSH 4.381 09/17/2015    Ct Chest High Resolution  Result Date: 05/31/2016 CLINICAL DATA:  71 year old female with history of bronchiectasis. Acute exacerbation. Recent history of pneumonia in December 2017. EXAM: CT CHEST WITHOUT CONTRAST TECHNIQUE: Multidetector CT imaging of the chest was performed following the standard protocol without intravenous contrast. High resolution imaging of the lungs, as well as inspiratory and expiratory imaging, was performed. COMPARISON:  Chest CT 01/10/2016. FINDINGS: Cardiovascular: Heart size is normal. There is no significant pericardial fluid, thickening or pericardial calcification. Atherosclerotic calcifications in the thoracic aorta (mild). No calcifications are identified in the coronary arteries. Mediastinum/Nodes: No pathologically enlarged mediastinal or hilar lymph nodes. Please note  that accurate exclusion of hilar adenopathy is limited on noncontrast CT scans. Esophagus is unremarkable in appearance. No axillary lymphadenopathy. Lungs/Pleura: High-resolution images demonstrate widespread areas of cylindrical and varicose bronchiectasis, most evident throughout the mid to lower lungs bilaterally. The areas of greatest involvement demonstrate extensive thickening of the peribronchovascular interstitium with extensive peribronchovascular micro and macronodularity, associated peribronchovascular ground-glass attenuation and regional areas of architectural distortion, all of which have increased compared to prior study 01/10/2016. Relative sparing of the upper lungs. Inspiratory and expiratory imaging is unremarkable. No confluent consolidative airspace disease. No pleural effusions. Upper Abdomen: Unremarkable. Musculoskeletal: There are no aggressive appearing lytic or blastic lesions noted in the visualized portions of the skeleton. IMPRESSION: 1. 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 2. Aortic atherosclerosis. Electronically Signed   By: Vinnie Langton M.D.   On: 05/31/2016 15:22    Assessment &  Plan:   There are no diagnoses linked to this encounter. I am having Ms. Stradling maintain her Vitamin D, Estradiol, fluticasone, calcium carbonate, Multiple Vitamins-Minerals (CENTRUM SILVER PO), (Lactobacillus Rhamnosus, GG, (CULTURELLE PO)), acetaminophen, calcium carbonate, ranitidine, ALPRAZolam, tobramycin-dexamethasone, loratadine, aspirin EC, metoprolol tartrate, FLUTTER, guaiFENesin, oxymetazoline, Suvorexant, and mupirocin ointment.  No orders of the defined types were placed in this encounter.    Follow-up: No Follow-up on file.  Walker Kehr, MD

## 2016-07-03 NOTE — Assessment & Plan Note (Signed)
Resolved

## 2016-07-04 LAB — HEPATITIS C ANTIBODY: HCV Ab: NEGATIVE

## 2016-07-05 ENCOUNTER — Telehealth: Payer: Self-pay | Admitting: Emergency Medicine

## 2016-07-05 NOTE — Telephone Encounter (Signed)
Spoke with patient and informed her bronchoscopy planned for July 10, 2016 has been cancelled. The patient was informed we will contact her next week with the new time and date for the procedure. The patient verbalized understanding and did not have any questions. Nothing further is needed.

## 2016-07-08 ENCOUNTER — Telehealth: Payer: Self-pay | Admitting: Emergency Medicine

## 2016-07-08 NOTE — Telephone Encounter (Signed)
Spoke with pt. She was calling back to reschedule her bronchoscopy. The only day that she could do this is 07/12/16. I called [facility] in respiratory, they do not have availability that day. I have called the pt back and advised her of this. Pt was very upset and asked to find another day and let her know. I have left a message with [facility] to call me back.

## 2016-07-08 NOTE — Telephone Encounter (Signed)
Spoke with Monica [name] in respiratory. Pt has been scheduled for 07/16/16 at 8am at [facility]. Spoke with pt and she is aware of this information. An email has been sent to [place] to make him aware of this information as well.

## 2016-07-08 NOTE — Telephone Encounter (Signed)
Spoke with patient and asked if she available on Thursday or Friday to have rescheduled bronchoscopy performed. Pt states she does not have anyone who can drive her as her husband it cognitively impaired. Pt states she is going to contact her sister to see if she can drive her. Pt states she will call the office after talking to her sister.

## 2016-07-08 NOTE — Telephone Encounter (Signed)
Patient is calling to discuss appointment

## 2016-07-10 ENCOUNTER — Encounter (HOSPITAL_COMMUNITY): Payer: Medicare Other

## 2016-07-10 ENCOUNTER — Ambulatory Visit (HOSPITAL_COMMUNITY): Admit: 2016-07-10 | Payer: Medicare Other | Admitting: Emergency Medicine

## 2016-07-10 ENCOUNTER — Encounter (HOSPITAL_COMMUNITY): Payer: Self-pay

## 2016-07-10 SURGERY — VIDEO BRONCHOSCOPY WITHOUT FLUORO
Anesthesia: Moderate Sedation | Laterality: Bilateral

## 2016-07-16 ENCOUNTER — Ambulatory Visit (HOSPITAL_COMMUNITY): Payer: Medicare Other

## 2016-07-16 ENCOUNTER — Encounter (HOSPITAL_COMMUNITY)
Admission: RE | Disposition: A | Discharge status: Home / Self Care | Payer: Self-pay | Source: Ambulatory Visit | Attending: Emergency Medicine

## 2016-07-16 ENCOUNTER — Ambulatory Visit (HOSPITAL_COMMUNITY)
Admission: RE | Admit: 2016-07-16 | Discharge: 2016-07-16 | Disposition: A | Discharge status: Home / Self Care | Payer: Medicare Other | Source: Ambulatory Visit | Attending: Emergency Medicine | Admitting: Emergency Medicine

## 2016-07-16 ENCOUNTER — Encounter (HOSPITAL_COMMUNITY): Payer: Self-pay | Admitting: Respiratory Therapy

## 2016-07-16 DIAGNOSIS — R05 Cough: Secondary | ICD-10-CM | POA: Diagnosis not present

## 2016-07-16 DIAGNOSIS — J479 Bronchiectasis, uncomplicated: Secondary | ICD-10-CM | POA: Diagnosis not present

## 2016-07-16 DIAGNOSIS — I480 Paroxysmal atrial fibrillation: Secondary | ICD-10-CM | POA: Insufficient documentation

## 2016-07-16 DIAGNOSIS — K219 Gastro-esophageal reflux disease without esophagitis: Secondary | ICD-10-CM | POA: Insufficient documentation

## 2016-07-16 DIAGNOSIS — I700 Atherosclerosis of aorta: Secondary | ICD-10-CM | POA: Insufficient documentation

## 2016-07-16 DIAGNOSIS — Z9889 Other specified postprocedural states: Secondary | ICD-10-CM

## 2016-07-16 DIAGNOSIS — L509 Urticaria, unspecified: Secondary | ICD-10-CM | POA: Insufficient documentation

## 2016-07-16 DIAGNOSIS — K5730 Diverticulosis of large intestine without perforation or abscess without bleeding: Secondary | ICD-10-CM | POA: Insufficient documentation

## 2016-07-16 DIAGNOSIS — J309 Allergic rhinitis, unspecified: Secondary | ICD-10-CM | POA: Diagnosis not present

## 2016-07-16 DIAGNOSIS — J471 Bronchiectasis with (acute) exacerbation: Secondary | ICD-10-CM

## 2016-07-16 DIAGNOSIS — R918 Other nonspecific abnormal finding of lung field: Secondary | ICD-10-CM | POA: Diagnosis not present

## 2016-07-16 DIAGNOSIS — R059 Cough, unspecified: Secondary | ICD-10-CM

## 2016-07-16 DIAGNOSIS — Z79899 Other long term (current) drug therapy: Secondary | ICD-10-CM | POA: Diagnosis not present

## 2016-07-16 DIAGNOSIS — J984 Other disorders of lung: Secondary | ICD-10-CM | POA: Diagnosis not present

## 2016-07-16 DIAGNOSIS — R848 Other abnormal findings in specimens from respiratory organs and thorax: Secondary | ICD-10-CM | POA: Diagnosis not present

## 2016-07-16 HISTORY — PX: VIDEO BRONCHOSCOPY: SHX5072

## 2016-07-16 SURGERY — BRONCHOSCOPY, WITH FLUOROSCOPY
Anesthesia: Moderate Sedation | Laterality: Bilateral

## 2016-07-16 MED ORDER — LIDOCAINE HCL 2 % EX GEL
CUTANEOUS | Status: DC | PRN
  Administered 2016-07-16: 1

## 2016-07-16 MED ORDER — FENTANYL CITRATE (PF) 100 MCG/2ML IJ SOLN
INTRAMUSCULAR | Status: DC | PRN
  Administered 2016-07-16: 50 ug via INTRAVENOUS

## 2016-07-16 MED ORDER — FENTANYL CITRATE (PF) 100 MCG/2ML IJ SOLN
INTRAMUSCULAR | Status: AC
  Filled 2016-07-16: qty 4

## 2016-07-16 MED ORDER — CALCIUM CARBONATE ANTACID 500 MG PO CHEW: 2.0000 | CHEWABLE_TABLET | Freq: Every day | ORAL | Status: DC | PRN

## 2016-07-16 MED ORDER — PHENYLEPHRINE HCL 0.25 % NA SOLN
NASAL | Status: DC | PRN
  Administered 2016-07-16: 2 via NASAL

## 2016-07-16 MED ORDER — LIDOCAINE HCL 1 % IJ SOLN
INTRAMUSCULAR | Status: DC | PRN
  Administered 2016-07-16: 6 mL via RESPIRATORY_TRACT

## 2016-07-16 MED ORDER — SUVOREXANT 10 MG PO TABS: 10.0000 mg | ORAL_TABLET | Freq: Every evening | ORAL | Status: DC | PRN

## 2016-07-16 MED ORDER — SODIUM CHLORIDE 0.9 % IV SOLN
INTRAVENOUS | Status: DC
  Administered 2016-07-16: 08:00:00 via INTRAVENOUS

## 2016-07-16 MED ORDER — MIDAZOLAM HCL 10 MG/2ML IJ SOLN
INTRAMUSCULAR | Status: DC | PRN
  Administered 2016-07-16: 1 mg via INTRAVENOUS
  Administered 2016-07-16: 3 mg via INTRAVENOUS

## 2016-07-16 MED ORDER — MIDAZOLAM HCL 5 MG/ML IJ SOLN
INTRAMUSCULAR | Status: AC
  Filled 2016-07-16: qty 2

## 2016-07-16 NOTE — Interval H&P Note (Signed)
History and Physical Interval Note:  07/16/2016 8:09 AM  Monica [name]  has presented today for surgery, with the diagnosis of Cough.   The various methods of treatment have been discussed with the patient and family. After consideration of risks, benefits and other options for treatment, the patient has consented to  Procedure(s): VIDEO BRONCHOSCOPY WITH FLUORO (Bilateral) as a surgical intervention .  The patient's history has been reviewed, patient examined, no change in status, stable for surgery.  I have reviewed the patient's chart and labs.  Questions were answered to the patient's satisfaction.    Baltazar Apo, MD, PhD 07/16/2016, 8:10 AM [facility] [phone] or if no answer [phone]

## 2016-07-16 NOTE — Progress Notes (Signed)
Video Bronchoscopy done   Interventional Bronchial washing  done Interventional Bronchial brushings done x 2 sites  Procedure tolerated well

## 2016-07-16 NOTE — Discharge Instructions (Signed)
Flexible Bronchoscopy, Care After These instructions give you information on caring for yourself after your procedure. Your doctor may also give you more specific instructions. Call your doctor if you have any problems or questions after your procedure. Follow these instructions at home:  Do not eat or drink anything for 2 hours after your procedure. If you try to eat or drink before the medicine wears off, food or drink could go into your lungs. You could also burn yourself.  After 2 hours have passed and when you can cough and gag normally, you may eat soft food and drink liquids slowly.  The day after the test, you may eat your normal diet.  You may do your normal activities.  Keep all doctor visits. Get help right away if:  You get more and more short of breath.  You get light-headed.  You feel like you are going to pass out (faint).  You have chest pain.  You have new problems that worry you.  You cough up more than a little blood.  You cough up more blood than before. This information is not intended to replace advice given to you by your health care provider. Make sure you discuss any questions you have with your health care provider. Document Released: 02/17/2009 Document Revised: 09/28/2015 Document Reviewed: 12/25/2012 Elsevier Interactive Patient Education  2017 [place].  Nothing to eat or drink until  10:45am      Today  07/16/2016  PLEASE CALL OUR OFFICE FOR ANY QUESTIONS OR CONCERNS [phone]

## 2016-07-16 NOTE — H&P (View-Only) (Signed)
Subjective:    Patient ID: Monica [name], female    DOB: [birth year], 71 y.o.   MRN: [id number]  HPI Monica [name] is a 71 year old never smoker with a history of allergic rhinitis, GERD,urticaria. She has been followed in our office by Dr. Joya Gaskins for bronchiectasis. Her last CT scan of the chest was performed in 2011 which a person reviewed. This shows probably right middle lobe bronchiectatic changes. There are some micronodular disease could be consistent with atypical mycobacterial infection although this is never been documented. She was originally dx in setting recurrent bronchitis / PNA's. Had a bronchoscopy in 2003 - cx's negative. She has flared occasionally, last time was 2 yrs ago. She has been on scheduled BD's before, none currently, none for several years.   She does have more chronic sinus disease - does NSW reliably, flonase during allergy season, zyrtec during allergy season. GERD on prilosec, zantac prn.  Her ENT is Dr Thornell Mule, remote sinus sgy. She is treated with abx fairly frequently.   Denies any current cough, dyspnea. She is active, exercises.   Acute OV 01/02/16 -- This is an acute office visit today. Patient has a history of significant bronchiectasis, chronic cough with allergic rhinitis and GERD. Also since last visit she has been diagnosed with Paroxysmal atrial fibrillation. She had some episodes of increased GERD and also some increased rhinitis last week. She is coughing more during the day, mostly clear mucous. She underwent PFT on 8/7 that I have reviewed > shows normal airflows, normal volumes and diffusion. She restarted her prilosec and zantac and is currently taking. She is uses flonase prn, not using regularly right now. She is due for a CT chest soon. She does not use BD's  ROV 02/01/16 -- This is a follow-up visit for patient with bronchiectasis and an increase in her coughing over the last 2 months. At her last visit one month ago we restarted his nasal steroid,  briefly increased her omeprazole to every day. She underwent a CT scan of the chest on 01/10/16 that I personally reviewed. This shows slight increase in his plugging in setting of bronchiectasis and some scattered stable pulmonary nodules. Increased volume loss in the right middle lobe. She states that her cough has decreased some compared with last visit. Non-productive.   ROV 05/27/16 -- This is a follow up for pt with bronchiectasis, chronic cough (GERD + rhinitis). She was treated for community-acquired pneumonia in the end of December, main sx were fatigue. He cough was about her usual baseline. She has micronodular disease on CT scan of the chest that is consistent with Mycobacterium avium although this has never been fully established. She now feels a bit better - . She continues to cough some. Her biggest complaints are up and down energy level. She is coughing up white occasionally. She is using mucinex. Not claritin right now. She remains on Advair, unclear whether it is helpful.    ROV 07/02/16 -- patient has a history of bronchiectasis, chronic cough, GERD, rhinitis. She's continued to have guilty with cough. A CT scan of her chest was performed last 26/18 that I personally reviewed. I discussed this scan with T Parrett (whom the patient has seen more recently) > shows progression of some bronchiectasis, suspicion for [facility]. Her cough is finally improved some over the last 2 weeks. More energy. Cough remains dry. She is back on flutter valve but non- productive.  She has loratadine and flonase available, not yet back on it.  Review of Systems As per history of present illness     Objective:   Physical Exam Vitals:   07/02/16 1438  BP: 108/70  Pulse: 90  SpO2: 97%  Weight: 100 lb 9.6 oz (45.6 kg)  Height: 5' 2"  (1.575 m)   'Gen: Pleasant, thin woman, well-appearing, in no distress,  normal affect  ENT: No lesions,  mouth clear,  oropharynx clear, no postnasal drip  Neck: No JVD, no TMG, no carotid bruits  Lungs: No use of accessory muscles, clear without rales or rhonchi, no wheeze   Cardiovascular: RRR, heart sounds normal, no murmur or gallops, no peripheral edema  Musculoskeletal: No deformities, no cyanosis or clubbing  Neuro: alert, non focal  Skin: Warm, no lesions or rashes   01/10/16 --  COMPARISON:  11/02/2009  FINDINGS: Cardiovascular: Aorta normal caliber with minimal atherosclerotic calcification. No pericardial effusion.  Mediastinum/Nodes: Few normal sized mediastinal lymph nodes. No thoracic adenopathy. Esophagus unremarkable. Base of cervical region significant only for a 5 mm RIGHT thyroid nodule.  Lungs/Pleura: Scarring, collapse, and granulomatous calcifications within the medial segment RIGHT middle lobe associated with bronchiectasis. Minimal scattered peribronchovascular slightly nodular infiltrates in both lobes favor chronic infection. 4 mm LEFT lower lobe nodule image 126, suspect slightly more anteriorly positioned previous exam mild posterior due to infiltrate volume loss in LEFT lower lobe. Minimal granulomatous change lingula with a 4 mm calcified granuloma image 73. Mild  cylindrical bronchiectasis identified throughout both lungs. Scattered foci of mucous plugging in LEFT lower lobe. No segmental airspace consolidation, pleural effusion or pneumothorax. Mild scarring at apices bilaterally, stable. Subpleural nodule versus adjacent tiny nodules RIGHT lobe image 74 overall unchanged. Scattered calcified granulomata RIGHT lung. 7 mm subpleural nodule posterior RIGHT lower lobe image 91 stable. Underlying diffuse hyperinflation.  Upper Abdomen: Diverticula at splenic flexure of colon. 5 mm low-attenuation focus lateral segment LEFT lobe liver image 132 stable. Remaining visualized upper abdomen unremarkable  Musculoskeletal: No acute osseous findings.  IMPRESSION: Hyperinflated lungs with old granulomatous disease changes, bronchiectasis, scattered mucous plugging, and scattered stable pulmonary nodules.  Volume loss and scarring in RIGHT middle lobe increased since previous exam.  Scattered mild slightly nodular peribronchovascular infiltrates in both lungs, nonspecific but suggesting chronic infection question MAC.  Colonic diverticulosis at splenic flexure of colon.  Aortic atherosclerosis        Assessment & Plan:  Bronchiectasis without acute exacerbation We will plan to repeat a bronchoscopy in the near future to get culture information Please restart your loratadine every day.  Please restart your flonase 2 sprays each nostril daily through the allergy season.  Continue your flutter valve twice a day.  We will hold of on chest vest for now.  You may stop your mucinex.  We will not restart Advair for now.  Follow with Dr Lamonte Sakai next available after your bronchscopy to review your culture information.   ALLERGIC RHINITIS, SEASONAL Please restart your loratadine every day.  Please restart your flonase 2 sprays each nostril daily through the allergy season.   Baltazar Apo, MD, PhD 07/02/2016, 3:11 PM [facility] [phone] or if no answer [phone]

## 2016-07-16 NOTE — Op Note (Signed)
[facility] Cardiopulmonary Patient Name: Monica [name] Procedure Date: 07/16/2016 MRN: [id number] Attending MD: Collene Gobble , MD Date of Birth: 11/06/1945 CSN: [id number] Age: 71 Admit Type: Outpatient Ethnicity: Not Hispanic or Latino Procedure:            Bronchoscopy Indications:          Bronchiectasis, Chronic cough with abnormal CT Providers:            Collene Gobble, MD, Andre Lefort RRT,RCP, Cherre Huger RRT, RCP Referring MD:          Medicines:            Midazolam 4 mg IV, Fentanyl 50 mcg IV, Lidocaine 1%                        applied to cords 15 mL, Lidocaine 1% applied to the                        tracheobronchial tree 20 mL Complications:        No immediate complications Estimated Blood Loss: Estimated blood loss was minimal. Procedure:      Pre-Anesthesia Assessment:      - A History and Physical has been performed. Patient meds and allergies       have been reviewed. The risks and benefits of the procedure and the       sedation options and risks were discussed with the patient. All       questions were answered and informed consent was obtained. Patient       identification and proposed procedure were verified prior to the       procedure by the physician in the procedure room. Mental Status       Examination: normal. Airway Examination: small/crowded oropharyngeal       airway and retrognathia. Respiratory Examination: clear to auscultation.       CV Examination: regular rate and rhythm. ASA Grade Assessment: I - A       normal healthy patient. After reviewing the risks and benefits, the       patient was deemed in satisfactory condition to undergo the procedure.       The anesthesia plan was to use moderate sedation / analgesia (conscious       sedation). Immediately prior to administration of medications, the       patient was re-assessed for adequacy to receive sedatives. The heart       rate, respiratory rate,  oxygen saturations, blood pressure, adequacy of       pulmonary ventilation, and response to care were monitored throughout       the procedure. The physical status of the patient was re-assessed after       the procedure.      After obtaining informed consent, the bronchoscope was passed under       direct vision. Throughout the procedure, the patient's blood pressure,       pulse, and oxygen saturations were monitored continuously. the [id number]       ([id number]) scope was introduced through the left nostril and advanced to       the tracheobronchial tree. The procedure was accomplished without       difficulty. The patient tolerated the procedure well. The total duration  of the procedure was 23 minutes. Total fluoroscopy time was 50 seconds. Findings:      Nasal passageway: The right nasal passageway shows evidence of recent       bleeding.      Nasopharynx/oropharynx: The posterior oropharynx is erythematous and and       with some evidence pooling blood.      Respiratory tract: The larynx is normal. The vocal cords appear normal.       The subglottic space is normal. The trachea is of normal caliber. The       carina is sharp. The entire tracheobronchial tree was examined to at       least the first subsegmental level. Bronchial mucosa and anatomy are       normal; there are no endobronchial lesions and no secretions, except for       a small patchy area of hyperpigmented mucosa in the anterior basal       segment of the right lower lobe and some pale raised hypopigmented       smooth mucosa the medial basal segment of the left lower lobe. This area       looked like some raised area cartilage. ? whether the RLL area reflects       acanthosis, although it is quite focal.      Brushings of hyperpigmented lesion were obtained in the anterior basal       segment of the right lower lobe with a cytology brush and sent for       routine cytology.      Fluoroscopically guided  transbronchial brushings of a lesion were       obtained in the anterior basal segment of the right lower lobe with a       cytology brush and sent for routine cytology, AFB analysis & culture and       fungal analysis. Five samples were obtained.      Washings were obtained in the medial segment of the right middle lobe       and sent for routine cytology and bacterial, AFB and fungal analysis.       The return was blood-tinged and cloudy. Impression:      - Bronchiectasis      - Chronic cough with abnormal CT      - Right nasal passageway shows evidence of recent bleeding.      - Posterior oropharynx is erythematous and and with some evidence       pooling blood.      - Brushings were obtained.      - Fluoroscopically guided transbronchial brushings were obtained.      - Washings were obtained. Moderate Sedation:      Moderate (conscious) sedation was personally administered by the       endoscopist. The following parameters were monitored: oxygen saturation,       heart rate, blood pressure, respiratory rate, EKG, adequacy of pulmonary       ventilation, and response to care. Total physician intraservice time was       28 minutes. Recommendation:      - Await BAL, brushing, culture and cytology results.      - Follow up with bronchoscopist as previously scheduled. Procedure Code(s):      --- Professional ---      [phone], Bronchoscopy, rigid or flexible, including fluoroscopic guidance,       when performed; with brushing or protected brushings  29562, Moderate sedation services provided by the same physician or       other qualified health care professional performing the diagnostic or       therapeutic service that the sedation supports, requiring the presence       of an independent trained observer to assist in the monitoring of the       patient's level of consciousness and physiological status; initial 15       minutes of intraservice time, patient age 7 years or older       [phone], Moderate sedation services; each additional 15 minutes       intraservice time Diagnosis Code(s):      --- Professional ---      J47.9, Bronchiectasis, uncomplicated      V78, Cough      R91.8, Other nonspecific abnormal finding of lung field CPT copyright 2016 [employer]. All rights reserved. The codes documented in this report are preliminary and upon coder review may  be revised to meet current compliance requirements. Collene Gobble, MD Collene Gobble, MD 07/16/2016 9:16:46 AM Number of Addenda: 0 Scope In: 8:19:37 AM Scope Out: 8:42:23 AM

## 2016-07-17 ENCOUNTER — Encounter: Payer: Self-pay | Admitting: Emergency Medicine

## 2016-07-17 ENCOUNTER — Telehealth: Payer: Self-pay | Admitting: Emergency Medicine

## 2016-07-17 ENCOUNTER — Encounter (HOSPITAL_COMMUNITY): Payer: Self-pay | Admitting: Emergency Medicine

## 2016-07-17 LAB — ACID FAST SMEAR (AFB, MYCOBACTERIA): Acid Fast Smear: NEGATIVE

## 2016-07-17 MED ORDER — PREDNISONE 20 MG PO TABS: 20.0000 mg | ORAL_TABLET | Freq: Every day | ORAL | 0 refills | Status: DC

## 2016-07-17 NOTE — Telephone Encounter (Signed)
Received the following message from the pt through MyChart:  I am experiencing sneezing, rhinitis, headache & occasional bronchial cough since I got home yesterday after the procedure. My sinuses seem to be inflamed, probably from some of the meds that were put up my nose.  I just took mucinex 1200mg . Should I restart the Claritin? Since I am taking 81mg  aspirin, using Flonase   Seems to cause more nasal irritation & bleeding. Any suggestions? Thx    RB - please advise. Thanks.

## 2016-07-17 NOTE — Telephone Encounter (Signed)
Please ask her to stop the Flonase for 2-3 days. We can give her a couple days of low-dose prednisone to take the place of the Flonase she will be missing. Please order prednisone 20 mg daily for 3 days. If her nasal irritation is improved at that time she can try to go back on the Flonase. She may need to do it every other day for a while until the inflammation subsides.

## 2016-07-17 NOTE — Telephone Encounter (Signed)
Spoke with pt. She is aware of RB's recommendations. Rx for prednisone has been sent in. Nothing further was needed at this time.

## 2016-07-17 NOTE — Telephone Encounter (Signed)
Spoke with pt over the phone. See telephone encounter (07/17/2016)

## 2016-07-18 LAB — ACID FAST SMEAR (AFB): ACID FAST SMEAR - AFSCU2: NEGATIVE

## 2016-07-18 LAB — FUNGUS STAIN

## 2016-07-18 LAB — CULTURE, BAL-QUANTITATIVE W GRAM STAIN

## 2016-07-18 LAB — CULTURE, BAL-QUANTITATIVE

## 2016-07-18 LAB — ACID FAST SMEAR (AFB, MYCOBACTERIA)

## 2016-07-18 LAB — FUNGAL STAIN REFLEX

## 2016-07-19 ENCOUNTER — Ambulatory Visit: Payer: Medicare Other | Admitting: Adult Health

## 2016-07-29 DIAGNOSIS — H52203 Unspecified astigmatism, bilateral: Secondary | ICD-10-CM | POA: Diagnosis not present

## 2016-07-29 DIAGNOSIS — H524 Presbyopia: Secondary | ICD-10-CM | POA: Diagnosis not present

## 2016-07-29 DIAGNOSIS — H5213 Myopia, bilateral: Secondary | ICD-10-CM | POA: Diagnosis not present

## 2016-07-30 ENCOUNTER — Encounter: Payer: Self-pay | Admitting: Emergency Medicine

## 2016-07-30 NOTE — Telephone Encounter (Signed)
Please advise Dr Lamonte Sakai on patient e-mail below.   I am still coughing-non-productive and more bronchial the last couple days. The 3 days of prednisone relieved my rhinitis and some cough but it is back again. Any suggestions for treatment? Delsym helps but I can't be on it indefinitely. Should I restart the Advair? Using flutter valve bid.   Thanks   Last OV instruction Patient Instructions   We will plan to repeat a bronchoscopy in the near future to get culture information Please restart your loratadine every day.  Please restart your flonase 2 sprays each nostril daily through the allergy season.  Continue your flutter valve twice a day.  We will hold of on chest vest for now.  You may stop your mucinex.  We will not restart Advair for now.  Follow with Dr Lamonte Sakai next available after your bronchscopy to review your culture information.

## 2016-07-30 NOTE — Telephone Encounter (Signed)
Per RB: Do not restart Advair as this will likely cause your cough to worsen and cause increased irritation to the upper airway.  No more Prednisone at this time. She can try a decongestant to see if any of this is related to drainage from allergies Try OTC Nasal Steroid -- Flonase 2 sprays at bedtime  Contact our office if symptoms do not improve.   Replied to e-mail from patient

## 2016-08-01 ENCOUNTER — Encounter: Payer: Self-pay | Admitting: Cardiovascular Disease

## 2016-08-04 ENCOUNTER — Encounter: Payer: Self-pay | Admitting: Emergency Medicine

## 2016-08-05 NOTE — Telephone Encounter (Signed)
I have called pt and scheduled appt with AD for tomorrow at 9:15 am

## 2016-08-06 ENCOUNTER — Encounter: Payer: Self-pay | Admitting: Pulmonary Disease

## 2016-08-06 ENCOUNTER — Ambulatory Visit (INDEPENDENT_AMBULATORY_CARE_PROVIDER_SITE_OTHER): Payer: Medicare Other | Admitting: Pulmonary Disease

## 2016-08-06 DIAGNOSIS — R05 Cough: Secondary | ICD-10-CM | POA: Diagnosis not present

## 2016-08-06 DIAGNOSIS — R059 Cough, unspecified: Secondary | ICD-10-CM

## 2016-08-06 MED ORDER — PREDNISONE 10 MG PO TABS: 20.0000 mg | ORAL_TABLET | Freq: Every day | ORAL | 0 refills | Status: AC

## 2016-08-06 NOTE — Progress Notes (Signed)
Subjective:    Patient ID: Monica [name], female    DOB: [birth year], 71 y.o.   MRN: [id number]  HPI Patient sees Dr. Lamonte Sakai, 71 year old female, history of allergic rhinitis, reflux disease, bronchiectasis.   Patient was last seen by Dr. Lamonte Sakai on 07/02/2016.  Patient has a chronic cough which has been going on since August 2017. She had a bronchoscopy done several weeks ago to rule out occult infection or NTM. Lavage results are negative except for pseudomonas aeruginosa. Also had a chest CT scan this year which showed worsening infiltrates compared to chest CT scan done late last year.  After the bronchoscopy, she had a mild flare for cough for which she was placed on prednisone for several days. Prednisone improved her cough.  The last week, her cough has gotten worse. She is constantly coughing now, day and night.     Review of Systems  Constitutional: Negative.   HENT: Negative.   Eyes: Negative.   Respiratory: Positive for cough and shortness of breath.   Cardiovascular: Negative.   Gastrointestinal: Negative.   Endocrine: Negative.   Genitourinary: Negative.   Musculoskeletal: Negative.   Skin: Negative.   All other systems reviewed and are negative.      Objective:   Physical Exam  Vitals:  Vitals:   08/06/16 0916  BP: 122/80  Pulse: 100  SpO2: 99%  Weight: 100 lb 9.6 oz (45.6 kg)  Height: 5\' 2"  (1.575 m)    Constitutional/General:  Pleasant, well-nourished, well-developed, not in any distress,  Comfortably seating.  Well kempt  Body mass index is 18.4 kg/m. Wt Readings from Last 3 Encounters:  08/06/16 100 lb 9.6 oz (45.6 kg)  07/03/16 101 lb (45.8 kg)  07/02/16 100 lb 9.6 oz (45.6 kg)    HEENT: Pupils equal and reactive to light and accommodation. Anicteric sclerae. Normal nasal mucosa.   No oral  lesions,  mouth clear,  oropharynx clear, no postnasal drip. (-) Oral thrush. No dental caries.  Airway - Mallampati class III  Neck: No masses.  Midline trachea. No JVD, (-) LAD. (-) bruits appreciated.  Respiratory/Chest: Grossly normal chest. (-) deformity. (-) Accessory muscle use.  Symmetric expansion. (-) Tenderness on palpation.  Resonant on percussion.  Diminished BS on both lower lung zones. (-) wheezing, rhonchi Some crackles at the bases.  (-) egophony  Cardiovascular: Regular rate and  rhythm, heart sounds normal, no murmur or gallops, no peripheral edema  Gastrointestinal:  Normal bowel sounds. Soft, non-tender. No hepatosplenomegaly.  (-) masses.   Musculoskeletal:  Normal muscle tone. Normal gait.   Extremities: Grossly normal. (-) clubbing, cyanosis.  (-) edema  Skin: (-) rash,lesions seen.   Neurological/Psychiatric : alert, oriented to time, place, person. Normal mood and affect          Assessment & Plan:  Cough Patient has chronic bronchiectasis. Bronchiectasis is been stable for several years now.   Patient started coughing late last year. She ended up going to the office and was given medicines for upper airway cough syndrome, reflux disease, hyperreactive airway disease.  She has a flare of her cough end of December 2017. She sought TP and was given Z-Pak and cefuroxime for a week. Cough got better some. She was also started on Advair.  She followed up with Dr. Lamonte Sakai last month. Advair was discontinued. She had a repeat chest CT scan at the start of the year which showed worse infiltrates/bronchiectasis. She ended up having a cause to be last month to  rule out occult infection or NTM. AFB and fungal cultures are negative so far. She ended up having pseudomonas aeruginosa.  Her cough has gotten worse the last couple weeks. Worse the last week with a change in weather. She is constantly coughing.   Her cough is multifactorial: 1. Clinically, the recent flare is from allergic bronchitis/sinusitis with a change in weather/pollen 2. She has baseline bronchiectasis and could also be flared up. 3.  Her reflux is not optimally controlled and that his contribution to her cough. 4. Less likely hyperreactive airway disease. She did not get better with Advair. 5. She has pseudomonas aeruginosa in her lavage and her chest CT scan was concerning for worsening infiltrates. Not sure if this bacteria is  Contributing  to the cough. It could all just be a colonizer.  Plan : 1. Start Prednisone 10 mg/tab, 2 tablets a day every day for 5 days. 2. Continue with other meds  (flonase, zantac, claritin, delsym) 3. She is very sensitive to medicines. I wanted to give her PPIs but she wanted to hold off as it can make her osteoporosis worse. Consider PPI on follow-up if not better. 4. I wanted to treat her pseudomonas aeruginosa as she has that in her lavage and her CAT scan is worse compared to a year ago. She has a lot of allergies to antibiotics. Pseudomonas could also be a colonizer. I will defer that decision to Dr. Lamonte Sakai.  If she is not better in 2-3 weeks, I guess we can consider treating the pseudomonas.      I personally reviewed previous images (Chest Xray, Chest Ct scan) done on this patient. I reviewed the reports on the images as well.   I spent at least 25 minutes with the patient today and more than 50% was spent counseling the patient regarding disease and management and facilitating labs and medications.     Return to clinic in 2-3 weeks.   Monica Becton, MD 08/06/2016, 1:41 PM [facility] Pager [phone] After 3 pm or if no answer, call [phone]

## 2016-08-06 NOTE — Patient Instructions (Signed)
It was a pleasure taking care of you today!  You are diagnosed with bronchitis/allergic rhinitis.  Start Prednisone 10 mg/tab, 2 tablets a day every day for 5 days. Continue with other meds as we discussed (flonase, zantac, claritin, delsym)  Return to clinic in 1-2 weeks with Dr. Lamonte Sakai

## 2016-08-06 NOTE — Assessment & Plan Note (Addendum)
Patient has chronic bronchiectasis. Bronchiectasis is been stable for several years now.   Patient started coughing late last year. She ended up going to the office and was given medicines for upper airway cough syndrome, reflux disease, hyperreactive airway disease.  She has a flare of her cough end of December 2017. She sought TP and was given Z-Pak and cefuroxime for a week. Cough got better some. She was also started on Advair.  She followed up with Dr. Lamonte Sakai last month. Advair was discontinued. She had a repeat chest CT scan at the start of the year which showed worse infiltrates/bronchiectasis. She ended up having a cause to be last month to rule out occult infection or NTM. AFB and fungal cultures are negative so far. She ended up having pseudomonas aeruginosa.  Her cough has gotten worse the last couple weeks. Worse the last week with a change in weather. She is constantly coughing.   Her cough is multifactorial: 1. Clinically, the recent flare is from allergic bronchitis/sinusitis with a change in weather/pollen 2. She has baseline bronchiectasis and could also be flared up. 3. Her reflux is not optimally controlled and that his contribution to her cough. 4. Less likely hyperreactive airway disease. She did not get better with Advair. 5. She has pseudomonas aeruginosa in her lavage and her chest CT scan was concerning for worsening infiltrates. Not sure if this bacteria is  Contributing  to the cough. It could all just be a colonizer.  Plan : 1. Start Prednisone 10 mg/tab, 2 tablets a day every day for 5 days. 2. Continue with other meds  (flonase, zantac, claritin, delsym) 3. She is very sensitive to medicines. I wanted to give her PPIs but she wanted to hold off as it can make her osteoporosis worse. Consider PPI on follow-up if not better. 4. I wanted to treat her pseudomonas aeruginosa as she has that in her lavage and her CAT scan is worse compared to a year ago. She has a lot of  allergies to antibiotics. Pseudomonas could also be a colonizer. I will defer that decision to Dr. Lamonte Sakai.  If she is not better in 2-3 weeks, I guess we can consider treating the pseudomonas.

## 2016-08-13 ENCOUNTER — Encounter: Payer: Self-pay | Admitting: Emergency Medicine

## 2016-08-13 ENCOUNTER — Ambulatory Visit (INDEPENDENT_AMBULATORY_CARE_PROVIDER_SITE_OTHER): Payer: Medicare Other | Admitting: Emergency Medicine

## 2016-08-13 VITALS — BP 98/62 | HR 94 | Ht 62.0 in | Wt 101.0 lb

## 2016-08-13 DIAGNOSIS — J301 Allergic rhinitis due to pollen: Secondary | ICD-10-CM | POA: Diagnosis not present

## 2016-08-13 DIAGNOSIS — J471 Bronchiectasis with (acute) exacerbation: Secondary | ICD-10-CM | POA: Diagnosis not present

## 2016-08-13 DIAGNOSIS — R059 Cough, unspecified: Secondary | ICD-10-CM

## 2016-08-13 DIAGNOSIS — R05 Cough: Secondary | ICD-10-CM

## 2016-08-13 NOTE — Progress Notes (Signed)
Subjective:    Patient ID: Monica [name], female    DOB: 1945/09/19, 71 y.o.   MRN: [id number]  HPI Monica [name] is a 71 year old never smoker with a history of allergic rhinitis, GERD,urticaria. She has been followed in our office by Dr. Joya Gaskins for bronchiectasis. Her last CT scan of the chest was performed in 2011 which a person reviewed. This shows probably right middle lobe bronchiectatic changes. There are some micronodular disease could be consistent with atypical mycobacterial infection although this is never been documented. She was originally dx in setting recurrent bronchitis / PNA's. Had a bronchoscopy in 2003 - cx's negative. She has flared occasionally, last time was 2 yrs ago. She has been on scheduled BD's before, none currently, none for several years.   She does have more chronic sinus disease - does [place] reliably, flonase during allergy season, zyrtec during allergy season. GERD on prilosec, zantac prn.  Her ENT is Dr Thornell Mule, remote sinus sgy. She is treated with abx fairly frequently.   Denies any current cough, dyspnea. She is active, exercises.   Acute OV 01/02/16 -- This is an acute office visit today. Patient has a history of significant bronchiectasis, chronic cough with allergic rhinitis and GERD. Also since last visit she has been diagnosed with Paroxysmal atrial fibrillation. She had some episodes of increased GERD and also some increased rhinitis last week. She is coughing more during the day, mostly clear mucous. She underwent PFT on 8/7 that I have reviewed > shows normal airflows, normal volumes and diffusion. She restarted her prilosec and zantac and is currently taking. She is uses flonase prn, not using regularly right now. She is due for a CT chest soon. She does not use BD's  ROV 02/01/16 -- This is a follow-up visit for patient with bronchiectasis and an increase in her coughing over the last 2 months. At her last visit one month ago we restarted his nasal steroid,  briefly increased her omeprazole to every day. She underwent a CT scan of the chest on 01/10/16 that I personally reviewed. This shows slight increase in his plugging in setting of bronchiectasis and some scattered stable pulmonary nodules. Increased volume loss in the right middle lobe. She states that her cough has decreased some compared with last visit. Non-productive.   ROV 05/27/16 -- This is a follow up for pt with bronchiectasis, chronic cough (GERD + rhinitis). She was treated for community-acquired pneumonia in the end of December, main sx were fatigue. 71 cough was about her usual baseline. She has micronodular disease on CT scan of the chest that is consistent with Mycobacterium avium although this has never been fully established. She now feels a bit better - . She continues to cough some. Her biggest complaints are up and down energy level. She is coughing up white occasionally. She is using mucinex. Not claritin right now. She remains on Advair, unclear whether it is helpful.    ROV 07/02/16 -- patient has a history of bronchiectasis, chronic cough, GERD, rhinitis. She's continued to have guilty with cough. A CT scan of her chest was performed last 26/18 that I personally reviewed. I discussed this scan with T Parrett (whom the patient has seen more recently) > shows progression of some bronchiectasis, suspicion for [facility]. Her cough is finally improved some over the last 2 weeks. More energy. Cough remains dry. She is back on flutter valve but non- productive.  She has loratadine and flonase available, not yet back on it.  ROV 08/13/16 -- pt has chronic cough. This is in setting  bronchiectasis, GERD (untreated), allergic rhinitis. She has been on another course prednisone beginning of this month, transient relief in her cough. Eosinophils normal. FOB 3/13 consistent with UA irritation. BAL has grown pan-sensitive pseudomonas, fungal and AFB negative (BAL and brushing). Now off Advair. Her cough is currently happening daily. Never productive. She is on flonase and [place] daily, loratadine daily. She has been on allergy shots in the past, not currently. 71 Last seen by Kozlow. She does not have any GERD, is on ranitidine. She occasionally uses PPI, not often.     Review of Systems As per history of present illness     Objective:   Physical Exam Vitals:   08/13/16 1517  BP: 98/62  Pulse: 94  SpO2: 97%  Weight: 101 lb (45.8 kg)  Height: _0  (1.575 m)   'Gen: Pleasant, thin woman, well-appearing, in no distress,  normal affect  ENT: No lesions,  mouth clear,  oropharynx clear, no postnasal drip  Neck: No JVD, no TMG, no carotid bruits  Lungs: No use of accessory muscles, clear without rales or rhonchi, no wheeze   Cardiovascular: RRR, heart sounds normal, no murmur or gallops, no peripheral edema  Musculoskeletal: No deformities, no cyanosis or clubbing  Neuro: alert, non focal  Skin: Warm, no lesions or rashes   01/10/16 --  COMPARISON:  11/02/2009  FINDINGS: Cardiovascular: Aorta normal caliber with minimal atherosclerotic calcification. No pericardial effusion.  Mediastinum/Nodes: Few normal sized mediastinal lymph nodes. No thoracic adenopathy. Esophagus unremarkable. Base of cervical region significant only for a 5 mm RIGHT thyroid nodule.  Lungs/Pleura: Scarring, collapse, and granulomatous calcifications within the medial segment RIGHT middle lobe associated with bronchiectasis. Minimal scattered peribronchovascular slightly nodular infiltrates in both lobes favor chronic infection. 4 mm LEFT lower lobe nodule image 126, suspect slightly more  anteriorly positioned previous exam mild posterior due to infiltrate volume loss in LEFT lower lobe. Minimal granulomatous change lingula with a 4 mm calcified granuloma image 73. Mild cylindrical bronchiectasis identified throughout both lungs. Scattered foci of mucous plugging in LEFT lower lobe. No segmental airspace consolidation, pleural effusion or pneumothorax. Mild scarring at apices bilaterally, stable. Subpleural nodule versus adjacent tiny nodules RIGHT lobe image 74 overall unchanged. Scattered calcified granulomata RIGHT lung. 7 mm subpleural nodule posterior RIGHT lower lobe image 91 stable. Underlying diffuse hyperinflation.  Upper Abdomen: Diverticula at splenic flexure of colon. 5 mm low-attenuation focus lateral segment LEFT lobe liver image 132 stable. Remaining visualized upper abdomen unremarkable  Musculoskeletal: No acute osseous findings.  IMPRESSION: Hyperinflated lungs with old granulomatous disease changes, bronchiectasis, scattered mucous plugging, and scattered stable pulmonary nodules.  Volume loss and scarring in RIGHT middle lobe increased since previous exam.  Scattered mild slightly nodular peribronchovascular infiltrates in both lungs, nonspecific but suggesting chronic infection question MAC.  Colonic diverticulosis at splenic flexure of colon.  Aortic atherosclerosis        Assessment & Plan:  Cough Continues to have cough, non-productive. Potential factors > undertreated allergic rhinitis (she does have breakthrough sx), undertreated GERD (only occasional sx), bronchiectasis with pseudomonal colonization but no significant sputum production. Also consider occult asthma (spirometry reassuring). At this point I would like to address each of these factors, try to maximally treat. Hesitant however to treat pseudomonas because she is intolerant of quinolones. If we ultimately need to do so then we will have to treat side effects as well.    Please continue your medications  as you are taking them  We will have you see Dr Neldon Mc to discuss the possibility of restarting allergy shots We will arrange for a methacholine challenge.  We will hold off on treating Pseudomonas for now since quinolones are very difficult for you to take. It we do ultimately decide to prescribe then we will have to use levaquin, probably in conjunction with anti-emetic medications.  Follow with Dr Lamonte Sakai in 4-6 weeks or sooner if you have any problems.  Baltazar Apo, MD, PhD 08/13/2016, 5:39 PM [facility] [phone] or if no answer [phone]

## 2016-08-13 NOTE — Patient Instructions (Signed)
Please continue your medications as you are taking them  We will have you see Dr Neldon Mc to discuss the possibility of restarting allergy shots We will arrange for a methacholine challenge.  We will hold off on treating Pseudomonas for now since quinolones are very difficult for you to take. It we do ultimately decide to prescribe then we will have to use levaquin, probably in conjunction with anti-emetic medications.  Follow with Dr Lamonte Sakai in 4-6 weeks or sooner if you have any problems.

## 2016-08-13 NOTE — Assessment & Plan Note (Signed)
Continues to have cough, non-productive. Potential factors > undertreated allergic rhinitis (she does have breakthrough sx), undertreated GERD (only occasional sx), bronchiectasis with pseudomonal colonization but no significant sputum production. Also consider occult asthma (spirometry reassuring). At this point I would like to address each of these factors, try to maximally treat. Hesitant however to treat pseudomonas because she is intolerant of quinolones. If we ultimately need to do so then we will have to treat side effects as well.   Please continue your medications as you are taking them  We will have you see Dr Neldon Mc to discuss the possibility of restarting allergy shots We will arrange for a methacholine challenge.  We will hold off on treating Pseudomonas for now since quinolones are very difficult for you to take. It we do ultimately decide to prescribe then we will have to use levaquin, probably in conjunction with anti-emetic medications.  Follow with Dr Lamonte Sakai in 4-6 weeks or sooner if you have any problems.

## 2016-08-15 LAB — FUNGUS CULTURE RESULT

## 2016-08-15 LAB — FUNGAL ORGANISM REFLEX

## 2016-08-15 LAB — FUNGUS CULTURE WITH STAIN

## 2016-08-19 ENCOUNTER — Encounter: Payer: Self-pay | Admitting: Emergency Medicine

## 2016-08-19 NOTE — Telephone Encounter (Signed)
RB  Please Advise-  Please see pt email

## 2016-08-20 NOTE — Telephone Encounter (Signed)
BQ  Please Advise in RB's absence-  Please see pt's emails below

## 2016-08-21 NOTE — Telephone Encounter (Signed)
Patient refused prednisone Rx at this time. I responded offering patient an appt if she wants to come in and be seen.  Will send to Dr Lamonte Sakai as Juluis Rainier and await for response from patient.

## 2016-08-21 NOTE — Telephone Encounter (Signed)
This is unfortunately a difficult situation. Based on our last conversation she was having persistent rhinitis even on good allergy therapy. She is correct that prednisone has not changed the symptoms much.   We could consider targeting the symptoms, trying cough suppression if she believes this would help > either tessalon perles or cough syrup (tussionex or hycodan).

## 2016-08-22 ENCOUNTER — Encounter: Payer: Self-pay | Admitting: Cardiovascular Disease

## 2016-08-22 NOTE — Telephone Encounter (Signed)
CDY- please advise what to give- RB had rec tessalon OR hycodan OR tussionex  Pt states she does not tolerate tessalon and wants a syrup  She will pick up rx  Please advise, thanks!

## 2016-08-22 NOTE — Telephone Encounter (Signed)
Suggest otc Delsym for daytime use.  Ok to give prometh codeine 200 ml     5 ml every 6 hours if needed for cough

## 2016-08-22 NOTE — Addendum Note (Signed)
Addended by: Rosana Berger on: 08/22/2016 01:06 PM   Modules accepted: Orders

## 2016-08-23 MED ORDER — PROMETHAZINE-CODEINE 6.25-10 MG/5ML PO SYRP: 5.0000 mL | ORAL_SOLUTION | Freq: Four times a day (QID) | ORAL | 0 refills | Status: DC | PRN

## 2016-08-23 NOTE — Addendum Note (Signed)
Addended by: Elie Confer on: 08/23/2016 09:21 AM   Modules accepted: Orders

## 2016-08-23 NOTE — Addendum Note (Signed)
Addended by: Elie Confer on: 08/23/2016 09:16 AM   Modules accepted: Orders

## 2016-08-29 ENCOUNTER — Ambulatory Visit (INDEPENDENT_AMBULATORY_CARE_PROVIDER_SITE_OTHER): Payer: Medicare Other | Admitting: Cardiovascular Disease

## 2016-08-29 ENCOUNTER — Encounter: Payer: Self-pay | Admitting: Cardiovascular Disease

## 2016-08-29 VITALS — BP 102/64 | HR 83 | Ht 62.0 in | Wt 100.0 lb

## 2016-08-29 DIAGNOSIS — I480 Paroxysmal atrial fibrillation: Secondary | ICD-10-CM

## 2016-08-29 LAB — ACID FAST CULTURE WITH REFLEXED SENSITIVITIES (MYCOBACTERIA): Acid Fast Culture: NEGATIVE

## 2016-08-29 MED ORDER — APIXABAN 5 MG PO TABS: 5.0000 mg | ORAL_TABLET | Freq: Two times a day (BID) | ORAL | 3 refills | Status: DC

## 2016-08-29 MED ORDER — APIXABAN 5 MG PO TABS: 5.0000 mg | ORAL_TABLET | Freq: Two times a day (BID) | ORAL | 0 refills | Status: DC

## 2016-08-29 NOTE — Patient Instructions (Signed)
Dr Sallyanne Kuster has recommended making the following medication changes: 1. START Eliquis 5 mg - take 1 tablet by mouth twice daily  Keep your appointment with Dr C in July!

## 2016-08-29 NOTE — Progress Notes (Signed)
Cardiology Office Note    Date:  08/29/2016   ID:  Monica [name], DOB 1945-05-28, MRN [id number]  Requesting physician:  Walker Kehr, MD  Cardiologist:   Sanda Klein, MD    Chief Complaint  Patient presents with  . Follow-up  . Chest Pain    History of Present Illness:  Monica [name] is a 71 y.o. retired Marine scientist who presents for evaluation for atrial fibrillation. The first documented episode occurred on Mother's Day 2017. Reportedly the "rhythm strips" performed by EMS showed atrial fibrillation and this was confirmed on evaluation by the emergency room physician.   She has subsequently purchased a home smartphone rhythm monitor  ([employer]) which has confirmed at least one new episode of atrial fibrillation that occurred on August 17, 2016. The episodes occur unpredictably and last typically for 10-20 minutes. They are not particularly symptomatic. She finds it difficult to distinguish them from her lifelong PVCs.  She is here to discuss initiation of anticoagulant therapy.  Evaluation has included a normal echocardiogram with a small left atrial size, normal TSH and generally normal lab work. She is physically active and denies exertional dyspnea or angina. She has no history of heart problems, stroke, TIA, peripheral embolism, bleeding problems. She finds herself under a lot of emotional stress recently since her husband has early cognitive impairment and their interaction has often become frustrating.  Other comorbid conditions include bronchiectasis, gastroesophageal reflux disease, irritable bowel syndrome, osteoporosis, chronic sinusitis and mild anxiety disorder, none of which are currently a big problem.  Past Medical History:  Diagnosis Date  . Adenomatous colon polyp 10/2003  . Allergic rhinitis   . Allergy    SEASONAL  . Bronchiectasis   . Chronic sinusitis   . Diverticulosis   . GERD (gastroesophageal reflux disease)   . Hearing loss   . Hemorrhoids    . Insomnia   . Menopausal disorder   . Osteoporosis   . Palpitations   . TMJ syndrome   . Urticaria     Past Surgical History:  Procedure Laterality Date  . COLONOSCOPY    . NASAL SINUS SURGERY    . TYMPANOSTOMY    . VIDEO BRONCHOSCOPY Bilateral 07/16/2016   Procedure: VIDEO BRONCHOSCOPY WITH FLUORO;  Surgeon: Collene Gobble, MD;  Location: [facility] ENDOSCOPY;  Service: Cardiopulmonary;  Laterality: Bilateral;    Current Medications: Outpatient Medications Prior to Visit  Medication Sig Dispense Refill  . acetaminophen (TYLENOL) 500 MG tablet Take 500 mg by mouth every 6 (six) hours as needed for moderate pain or headache.    . ALPRAZolam (XANAX) 0.25 MG tablet Take 1 tablet (0.25 mg total) by mouth 2 (two) times daily as needed for anxiety (palpitations). 60 tablet 1  . aspirin EC 81 MG tablet Take 1 tablet (81 mg total) by mouth daily. 90 tablet 3  . calcium carbonate (TUMS) 500 MG chewable tablet Chew 2 tablets (400 mg of elemental calcium total) by mouth daily as needed for indigestion.    Marland Kitchen CALCIUM-VITAMIN D PO Take 1 tablet by mouth daily.    . Cholecalciferol (VITAMIN D) 1000 UNITS capsule Take 1,000 Units by mouth daily.      . Estradiol 10 MCG TABS Place 1 tablet vaginally 2 (two) times a week.     . fluticasone (FLONASE) 50 MCG/ACT nasal spray Place 2 sprays into both nostrils at bedtime as needed for allergies or rhinitis.    . Lactobacillus Rhamnosus, GG, (CULTURELLE PO) Take 1 tablet by  mouth daily.    Marland Kitchen loratadine (CLARITIN) 10 MG tablet Take 10 mg by mouth daily.     . metoprolol tartrate (LOPRESSOR) 25 MG tablet Take 0.5 tablets (12.5 mg total) by mouth daily as needed (palpitations). 30 tablet 3  . Multiple Vitamins-Minerals (CENTRUM SILVER PO) Take 1 tablet by mouth daily.     . mupirocin ointment (BACTROBAN) 2 % Apply 1 application topically 2 (two) times daily as needed (dryness).     Marland Kitchen omeprazole (PRILOSEC OTC) 20 MG tablet Take 20 mg by mouth daily.    .  promethazine-codeine (PHENERGAN WITH CODEINE) 6.25-10 MG/5ML syrup Take 5 mLs by mouth every 6 (six) hours as needed for cough. 200 mL 0  . ranitidine (ZANTAC) 150 MG tablet Take 150 mg by mouth 2 (two) times daily.    Marland Kitchen Respiratory Therapy Supplies (FLUTTER) DEVI Use as directed 1 each 0  . Suvorexant (BELSOMRA) 10 MG TABS Take 10 mg by mouth at bedtime as needed (sleep).    Marland Kitchen tobramycin-dexamethasone (TOBRADEX) ophthalmic solution Place 3 drops into the right ear 3 (three) times daily as needed (ear irritation).     . Valerian 500 MG CAPS Take 500 mg by mouth daily as needed (sleep).     No facility-administered medications prior to visit.      Allergies:   Amoxicillin-pot clavulanate; Avelox [moxifloxacin hcl in nacl]; Bactrim [sulfamethoxazole-trimethoprim]; Benzonatate; and Ciprofloxacin   Social History   Social History  . Marital status: Married    Spouse name: [name]  . Number of children: 0  . Years of education: BSN   Occupational History  . retired Therapist, sports    Social History Main Topics  . Smoking status: Never Smoker  . Smokeless tobacco: Never Used  . Alcohol use 1.2 oz/week    2 Glasses of wine per week  . Drug use: No  . Sexual activity: Not Asked   Other Topics Concern  . None   Social History Narrative   Lives with husband   Caffeine use: 1 cup tea per day     Family History:  The patient's family history includes AVM in her brother; Cancer in her maternal grandmother, mother, and paternal uncle; Colon cancer (age of onset: 11) in her mother; Heart disease in her father and maternal grandfather; Hypertension in her father; Rectal cancer in her mother; Seizures in her mother.   ROS:   Please see the history of present illness.    ROS All other systems reviewed and are negative.   PHYSICAL EXAM:   VS:  BP 102/64   Pulse 83   Ht 5\' 2"  (1.575 m)   Wt 45.4 kg (100 lb)   BMI 18.29 kg/m    GEN: Well nourished, well developed, in no acute distress  HEENT: normal    Neck: no JVD, carotid bruits, or masses Cardiac: RRR; no murmurs, rubs, or gallops,no edema  Respiratory:  clear to auscultation bilaterally, normal work of breathing GI: soft, nontender, nondistended, + BS MS: no deformity or atrophy  Skin: warm and dry, no rash Neuro:  Alert and Oriented x 3, Strength and sensation are intact Psych: euthymic mood, full affect  Wt Readings from Last 3 Encounters:  08/29/16 45.4 kg (100 lb)  08/13/16 45.8 kg (101 lb)  08/06/16 45.6 kg (100 lb 9.6 oz)      Studies/Labs Reviewed:   EKG:  EKG is ordered today.  The ekg ordered today demonstrates Sinus rhythm, normal tracing, QTC 422 ms  Recent Labs:  09/17/2015: Magnesium 2.1; TSH 4.381 05/13/2016: BUN 16; Creatinine, Ser 0.70; Hemoglobin 13.8; Platelets 432.0; Potassium 4.1; Sodium 137   Lipid Panel    Component Value Date/Time   CHOL 157 04/13/2014 1156   TRIG 71.0 04/13/2014 1156   HDL 65.60 04/13/2014 1156   CHOLHDL 2 04/13/2014 1156   VLDL 14.2 04/13/2014 1156   LDLCALC 77 04/13/2014 1156     ASSESSMENT:    1. PAF (paroxysmal atrial fibrillation) [facility])      PLAN:   Monica [name] appears to have lone atrial fibrillation. This has now been confirmed as a recurrent abnormality.  CHADSVasc score of 2 (CHADS score 1), meets criteria for anticoagulation..  We have discussed the pros and cons of anticoagulation in a lot of detail. Her embolic risk is relatively low and her bleeding risk is increased due to her small body size, overall the risks and benefits appeared relatively balanced.   Her symptoms are infrequent, brief and mild. Antiarrhythmics are not justified. Her blood pressure today is quite low and again I doubt that she would tolerate daily beta blockers. Thankfully she doesn't need them.  After lengthy discussion and answering several questions Monica [name] wishes to start anticoagulation therapy. She has had some mild GI issues in the past and I would avoid Pradaxa and would  prefer Eliquis over Xarelto due to a possible less risky GI bleeding profile.    Medication Adjustments/Labs and Tests Ordered: Current medicines are reviewed at length with the patient today.  Concerns regarding medicines are outlined above.  Medication changes, Labs and Tests ordered today are listed in the Patient Instructions below. Patient Instructions  Dr Sallyanne Kuster has recommended making the following medication changes: 1. START Eliquis 5 mg - take 1 tablet by mouth twice daily  Keep your appointment with Dr C in July!    Signed, Sanda Klein, MD  08/29/2016 11:43 AM    [facility] [place], [place],   [address] Phone: [phone]; Fax: [phone]

## 2016-08-30 ENCOUNTER — Encounter (HOSPITAL_COMMUNITY): Payer: Medicare Other

## 2016-09-06 ENCOUNTER — Encounter: Payer: Self-pay | Admitting: Adult Health

## 2016-09-06 ENCOUNTER — Ambulatory Visit (INDEPENDENT_AMBULATORY_CARE_PROVIDER_SITE_OTHER): Payer: Medicare Other | Admitting: Adult Health

## 2016-09-06 VITALS — BP 108/64 | HR 92 | Ht 62.0 in | Wt 101.4 lb

## 2016-09-06 DIAGNOSIS — R05 Cough: Secondary | ICD-10-CM | POA: Diagnosis not present

## 2016-09-06 DIAGNOSIS — J301 Allergic rhinitis due to pollen: Secondary | ICD-10-CM

## 2016-09-06 DIAGNOSIS — J479 Bronchiectasis, uncomplicated: Secondary | ICD-10-CM | POA: Diagnosis not present

## 2016-09-06 DIAGNOSIS — R059 Cough, unspecified: Secondary | ICD-10-CM

## 2016-09-06 NOTE — Assessment & Plan Note (Signed)
Keep allergy follow up  Cont on current regimen

## 2016-09-06 NOTE — Assessment & Plan Note (Signed)
UACS ? Suspect is multifactoral from GERd/AR /Bronchiecatsis .  Would keep MCT ov -will rescheudule.  Cont on cough control reigmen .

## 2016-09-06 NOTE — Assessment & Plan Note (Signed)
Appears stable with ongoing chronic cough  BAL in March + psuedomonas,- ? coloniziation  Will continue to monitor for active iinfection sx .  She is going to Dr. Carmelina Peal , may need immune deficiency workup .

## 2016-09-06 NOTE — Progress Notes (Signed)
_0  ID: Monica [name], female    DOB: 04-08-1946, 71 y.o.   MRN: [id number]  Chief Complaint  Patient presents with  . Follow-up    Bronchiectasis     Referring provider: Cassandria Anger, MD  HPI: 71 yo female never smoker followed for AR , Urticaria, and Bronchiectasis   TEST  01/2016 CT chest >Hyperinflated lungs with old granulomatous disease changes, bronchiectasis, scattered mucous plugging, and scattered stable pulmonary nodules.Volume loss and scarring in RIGHT middle lobe increased since previous exam.Scattered mild slightly nodular peribronchovascular infiltrates in both lungs, nonspecific but suggesting chronic infection question MAC.  09/06/2016 Follow up : Bronchiectasis  Pt returns for a 1 month follow up . She returns with persistent symptoms of persistent cough . She was set up for Methacholine challenge test - she missed this appointment . We discussed rescheuding  She was referred to Allergist.  Does have sinus and post nasal drainage.  Taking codeine cough syrup and delsym for cough control .  Very frustrated that she has a cough . No fever, discolored mucus , joint pain , hemoptysis   Under a lot of stress, cares for husband.    Pt underwent FOB in March with BAL positive for pan sensitive psuedomonas (60K) . AFB neg. abx Tx has been on hold due to multiple abx intolerances, and ? Colonization vs active infection .  Recently started Eliquis 35m Twice daily  For PAF . By cardiology     Allergies  Allergen Reactions  . Amoxicillin-Pot Clavulanate Diarrhea    Has patient had a PCN reaction causing immediate rash, facial/tongue/throat swelling, SOB or lightheadedness with hypotension:No Has patient had a PCN reaction causing severe rash involving mucus membranes or skin necrosis:No Has patient had a PCN reaction that required hospitalization:No Has patient had a PCN reaction occurring within the last 10 years:Yes If all of the above answers are  "NO", then may proceed with Cephalosporin use  . Avelox [Moxifloxacin Hcl In Nacl] Nausea Only  . Bactrim [Sulfamethoxazole-Trimethoprim] Other (See Comments)    flushing  . Benzonatate Other (See Comments)    felt bad, out of sorts, disoriented.  . Ciprofloxacin Rash    Rash across abdomen    Immunization History  Administered Date(s) Administered  . Influenza Split 02/25/2011, 02/05/2012  . Influenza Whole 03/02/2008, 03/02/2009, 02/06/2010  . Influenza,inj,Quad PF,36+ Mos 02/04/2013, 01/24/2014, 02/02/2015, 02/01/2016  . Pneumococcal Conjugate-13 02/16/2013  . Pneumococcal Polysaccharide-23 03/11/2003, 01/16/2009, 09/26/2014  . Td 06/06/2001  . Tdap 07/17/2012  . Zoster 05/07/2007    Past Medical History:  Diagnosis Date  . Adenomatous colon polyp 10/2003  . Allergic rhinitis   . Allergy    SEASONAL  . Bronchiectasis   . Chronic sinusitis   . Diverticulosis   . GERD (gastroesophageal reflux disease)   . Hearing loss   . Hemorrhoids   . Insomnia   . Menopausal disorder   . Osteoporosis   . Palpitations   . TMJ syndrome   . Urticaria     Tobacco History: History  Smoking Status  . Never Smoker  Smokeless Tobacco  . Never Used   Counseling given: Not Answered   Outpatient Encounter Prescriptions as of 09/06/2016  Medication Sig  . acetaminophen (TYLENOL) 500 MG tablet Take 500 mg by mouth every 6 (six) hours as needed for moderate pain or headache.  . ALPRAZolam (XANAX) 0.25 MG tablet Take 1 tablet (0.25 mg total) by mouth 2 (two) times daily as needed for anxiety (palpitations).  .Marland Kitchen  apixaban (ELIQUIS) 5 MG TABS tablet Take 1 tablet (5 mg total) by mouth 2 (two) times daily.  Marland Kitchen CALCIUM-VITAMIN D PO Take 1 tablet by mouth daily.  . Cholecalciferol (VITAMIN D) 1000 UNITS capsule Take 1,000 Units by mouth daily.    . Estradiol 10 MCG TABS Place 1 tablet vaginally 2 (two) times a week.   . fluticasone (FLONASE) 50 MCG/ACT nasal spray Place 2 sprays into both  nostrils at bedtime as needed for allergies or rhinitis.  . Lactobacillus Rhamnosus, GG, (CULTURELLE PO) Take 1 tablet by mouth daily.  Marland Kitchen loratadine (CLARITIN) 10 MG tablet Take 10 mg by mouth daily.   . metoprolol tartrate (LOPRESSOR) 25 MG tablet Take 0.5 tablets (12.5 mg total) by mouth daily as needed (palpitations).  . Multiple Vitamins-Minerals (CENTRUM SILVER PO) Take 1 tablet by mouth daily.   . mupirocin ointment (BACTROBAN) 2 % Apply 1 application topically 2 (two) times daily as needed (dryness).   Marland Kitchen omeprazole (PRILOSEC OTC) 20 MG tablet Take 20 mg by mouth daily.  . promethazine-codeine (PHENERGAN WITH CODEINE) 6.25-10 MG/5ML syrup Take 5 mLs by mouth every 6 (six) hours as needed for cough.  . ranitidine (ZANTAC) 150 MG tablet Take 150 mg by mouth 2 (two) times daily.  Marland Kitchen tobramycin-dexamethasone (TOBRADEX) ophthalmic solution Place 3 drops into the right ear 3 (three) times daily as needed (ear irritation).   . Valerian 500 MG CAPS Take 500 mg by mouth daily as needed (sleep).  . [DISCONTINUED] aspirin EC 81 MG tablet Take 1 tablet (81 mg total) by mouth daily.  . [DISCONTINUED] calcium carbonate (TUMS) 500 MG chewable tablet Chew 2 tablets (400 mg of elemental calcium total) by mouth daily as needed for indigestion.  . [DISCONTINUED] Respiratory Therapy Supplies (FLUTTER) DEVI Use as directed  . [DISCONTINUED] Suvorexant (BELSOMRA) 10 MG TABS Take 10 mg by mouth at bedtime as needed (sleep).   No facility-administered encounter medications on file as of 09/06/2016.      Review of Systems  Constitutional:   No  weight loss, night sweats,  Fevers, chills, fatigue, or  lassitude.  HEENT:   No headaches,  Difficulty swallowing,  Tooth/dental problems, or  Sore throat,                No sneezing, itching, ear ache,  +nasal congestion, post nasal drip,   CV:  No chest pain,  Orthopnea, PND, swelling in lower extremities, anasarca, dizziness, palpitations, syncope.   GI  No  heartburn, indigestion, abdominal pain, nausea, vomiting, diarrhea, change in bowel habits, loss of appetite, bloody stools.   Resp:    No chest wall deformity  Skin: no rash or lesions.  GU: no dysuria, change in color of urine, no urgency or frequency.  No flank pain, no hematuria   MS:  No joint pain or swelling.  No decreased range of motion.  No back pain.    Physical Exam  BP 108/64 (BP Location: Left Arm, Patient Position: Sitting, Cuff Size: Normal)   Pulse 92   Ht _0  (1.575 m)   Wt 101 lb 6.4 oz (46 kg)   SpO2 98%   BMI 18.55 kg/m   GEN: A/Ox3; pleasant , NAD, w thin and petite    HEENT:  [place]/AT,  EACs-clear, TMs-wnl, NOSE-clear, THROAT-clear, no lesions, no postnasal drip or exudate noted.   NECK:  Supple w/ fair ROM; no JVD; normal carotid impulses w/o bruits; no thyromegaly or nodules palpated; no lymphadenopathy.    RESP  Clear  P & A; w/o, wheezes/ rales/ or rhonchi. no accessory muscle use, no dullness to percussion  CARD:  RRR, no m/r/g, no peripheral edema, pulses intact, no cyanosis or clubbing.  GI:   Soft & nt; nml bowel sounds; no organomegaly or masses detected.   Musco: Warm bil, no deformities or joint swelling noted.   Neuro: alert, no focal deficits noted.    Skin: Warm, no lesions or rashes    Lab Results:  CBC    Component Value Date/Time   WBC 10.4 05/13/2016 1634   RBC 4.60 05/13/2016 1634   HGB 13.8 05/13/2016 1634   HCT 40.3 05/13/2016 1634   PLT 432.0 (H) 05/13/2016 1634   MCV 87.7 05/13/2016 1634   MCH 30.8 09/17/2015 2020   MCHC 34.3 05/13/2016 1634   RDW 13.3 05/13/2016 1634   LYMPHSABS 1.3 05/13/2016 1634   MONOABS 0.6 05/13/2016 1634   EOSABS 0.1 05/13/2016 1634   BASOSABS 0.1 05/13/2016 1634    BMET    Component Value Date/Time   NA 137 05/13/2016 1634   K 4.1 05/13/2016 1634   CL 101 05/13/2016 1634   CO2 28 05/13/2016 1634   GLUCOSE 98 05/13/2016 1634   BUN 16 05/13/2016 1634   CREATININE 0.70 05/13/2016  1634   CALCIUM 9.3 05/13/2016 1634   GFRNONAA >60 09/17/2015 2020   GFRAA >60 09/17/2015 2020    BNP No results found for: BNP  ProBNP No results found for: PROBNP  Imaging: No results found.   Assessment & Plan:   Bronchiectasis without acute exacerbation ([place]) Appears stable with ongoing chronic cough  BAL in March + psuedomonas,- ? coloniziation  Will continue to monitor for active iinfection sx .  She is going to Dr. Carmelina Peal , may need immune deficiency workup .    ALLERGIC RHINITIS, SEASONAL Keep allergy follow up  Cont on current regimen   Cough UACS ? Suspect is multifactoral from GERd/AR /Bronchiecatsis .  Would keep MCT ov -will rescheudule.  Cont on cough control reigmen .      Rexene Edison, NP 09/06/2016

## 2016-09-06 NOTE — Patient Instructions (Addendum)
Reschedule methacholine challenge test .  Follow up with Dr. Carmelina Peal as planned.  Delsym 2 tsp Twice daily  As needed  Cough  Promethazine w/ codeine As needed  For severe cough .  Follow up with Dr. Lamonte Sakai  In 2-3 months and As needed   Please contact office for sooner follow up if symptoms do not improve or worsen or seek emergency care

## 2016-09-13 ENCOUNTER — Ambulatory Visit (HOSPITAL_COMMUNITY)
Admission: RE | Admit: 2016-09-13 | Discharge: 2016-09-13 | Disposition: A | Discharge status: Home / Self Care | Payer: Medicare Other | Source: Ambulatory Visit | Attending: Adult Health | Admitting: Adult Health

## 2016-09-13 DIAGNOSIS — R05 Cough: Secondary | ICD-10-CM

## 2016-09-13 DIAGNOSIS — R059 Cough, unspecified: Secondary | ICD-10-CM

## 2016-09-13 LAB — PULMONARY FUNCTION TEST
FEF 25-75 PRE: 1.82 L/s
FEF 25-75 Post: 1.91 L/sec
FEF2575-%CHANGE-POST: 4 %
FEF2575-%PRED-PRE: 106 %
FEF2575-%Pred-Post: 111 %
FEV1-%Change-Post: 0 %
FEV1-%PRED-POST: 97 %
FEV1-%PRED-PRE: 97 %
FEV1-PRE: 1.99 L
FEV1-Post: 1.98 L
FEV1FVC-%Change-Post: -4 %
FEV1FVC-%Pred-Pre: 107 %
FEV6-%CHANGE-POST: 4 %
FEV6-%PRED-POST: 97 %
FEV6-%Pred-Pre: 93 %
FEV6-PRE: 2.42 L
FEV6-Post: 2.52 L
FEV6FVC-%PRED-PRE: 105 %
FEV6FVC-%Pred-Post: 105 %
FVC-%Change-Post: 4 %
FVC-%Pred-Post: 93 %
FVC-%Pred-Pre: 89 %
FVC-Post: 2.53 L
FVC-Pre: 2.43 L
POST FEV1/FVC RATIO: 78 %
POST FEV6/FVC RATIO: 100 %
Pre FEV1/FVC ratio: 82 %
Pre FEV6/FVC Ratio: 100 %

## 2016-09-13 MED ORDER — METHACHOLINE 1 MG/ML NEB SOLN
2.0000 mL | Freq: Once | RESPIRATORY_TRACT | Status: AC
  Administered 2016-09-13: 2 mg via RESPIRATORY_TRACT

## 2016-09-13 MED ORDER — ALBUTEROL SULFATE (2.5 MG/3ML) 0.083% IN NEBU
2.5000 mg | INHALATION_SOLUTION | Freq: Once | RESPIRATORY_TRACT | Status: AC
  Administered 2016-09-13: 2.5 mg via RESPIRATORY_TRACT

## 2016-09-13 MED ORDER — METHACHOLINE 4 MG/ML NEB SOLN
2.0000 mL | Freq: Once | RESPIRATORY_TRACT | Status: AC
  Administered 2016-09-13: 8 mg via RESPIRATORY_TRACT

## 2016-09-13 MED ORDER — SODIUM CHLORIDE 0.9 % IN NEBU
3.0000 mL | INHALATION_SOLUTION | Freq: Once | RESPIRATORY_TRACT | Status: AC
  Administered 2016-09-13: 3 mL via RESPIRATORY_TRACT

## 2016-09-13 MED ORDER — METHACHOLINE 16 MG/ML NEB SOLN
2.0000 mL | Freq: Once | RESPIRATORY_TRACT | Status: AC
  Administered 2016-09-13: 32 mg via RESPIRATORY_TRACT

## 2016-09-13 MED ORDER — METHACHOLINE 0.0625 MG/ML NEB SOLN
2.0000 mL | Freq: Once | RESPIRATORY_TRACT | Status: AC
  Administered 2016-09-13: 0.125 mg via RESPIRATORY_TRACT

## 2016-09-13 MED ORDER — METHACHOLINE 0.25 MG/ML NEB SOLN
2.0000 mL | Freq: Once | RESPIRATORY_TRACT | Status: AC
  Administered 2016-09-13: 0.5 mg via RESPIRATORY_TRACT

## 2016-09-17 ENCOUNTER — Ambulatory Visit: Payer: Medicare Other | Admitting: Emergency Medicine

## 2016-09-24 ENCOUNTER — Ambulatory Visit (INDEPENDENT_AMBULATORY_CARE_PROVIDER_SITE_OTHER): Payer: Medicare Other | Admitting: Allergy and Immunology

## 2016-09-24 ENCOUNTER — Encounter: Payer: Self-pay | Admitting: Allergy and Immunology

## 2016-09-24 VITALS — BP 124/78 | HR 100 | Temp 98.7°F | Resp 20 | Ht 60.0 in | Wt 104.4 lb

## 2016-09-24 DIAGNOSIS — K219 Gastro-esophageal reflux disease without esophagitis: Secondary | ICD-10-CM | POA: Diagnosis not present

## 2016-09-24 DIAGNOSIS — J3089 Other allergic rhinitis: Secondary | ICD-10-CM | POA: Diagnosis not present

## 2016-09-24 DIAGNOSIS — J479 Bronchiectasis, uncomplicated: Secondary | ICD-10-CM

## 2016-09-24 MED ORDER — DEXLANSOPRAZOLE 60 MG PO CPDR: 60.0000 mg | DELAYED_RELEASE_CAPSULE | Freq: Every day | ORAL | 5 refills | Status: DC

## 2016-09-24 MED ORDER — MONTELUKAST SODIUM 10 MG PO TABS: 10.0000 mg | ORAL_TABLET | Freq: Every day | ORAL | 5 refills | Status: DC

## 2016-09-24 NOTE — Progress Notes (Signed)
Dear Dr. Alain Marion,  Thank you for referring Monica [name] to the [facility] on 09/24/2016.   Below is a summation of this patient's evaluation and recommendations.  Thank you for your referral. I will keep you informed about this patient's response to treatment.   If you have any questions please do not hesitate to contact me.   Sincerely,  Jiles Prows, MD Allergy / Immunology [place]   ______________________________________________________________________    NEW PATIENT NOTE  Referring Provider: Cassandria Anger, MD Primary Provider: Cassandria Anger, MD Date of office visit: 09/24/2016    Subjective:   Chief Complaint:  Monica [name] (DOB: November 07, 1945) is a 71 y.o. female who presents to the clinic on 09/24/2016 with a chief complaint of Cough (dry cough, prolonged. Bronchiactisis.); Gastroesophageal Reflux; and Sinus Problem (recurrent sinus infections, headache, fatique, sneezing, mild runny nose) .     HPI: Jacoba presents to this clinic in evaluation of cough. I had seen her in this clinic approximately 10 years ago at which point in time in which she had an acute dermatitis that fortunately resolved.  She has a long history of bronchiectasis that has been relatively asymptomatic. In August 2017 she developed a persistent cough with a waxing and waning quality with a particular bad winter. Apparently she developed a "pneumonia" in December requiring an antibiotic. She underwent evaluation with Dr. Lamonte Sakai with a bronchoscopy about 2 months ago which did not identify any significant abnormality although she apparently did have a growth of Pseudomonas but no AFB or fungal elements. She has also had a methacholine challenge test which apparently did not identify any bronchial hyperreactivity. She has been treated with multiple medications including antibiotics and Advair  and therapy directed against reflux which has not really helped her. She does think that she is worse over the course of the past 2 months since her bronchoscopy.  She does have a history of "allergies". She starts Flonase every February through the entire spring season for what sounds like occasional nasal congestion and maybe some clear rhinorrhea. However, her allergies are actually more defined by headaches that can make her nauseated and occur with less than a frequency of one time per month. There is no obvious provoking factor giving rise to these issues.  She does not really have a history of recurrent infections involving her respiratory tract or skin or other organs. She does have ETD followed by Dr. Ronette Deter who has been treating her with placement of ear ventilation tube in her right tympanic membrane for the past 15 years.  Past Medical History:  Diagnosis Date  . Adenomatous colon polyp 10/2003  . Allergic rhinitis   . Allergy    SEASONAL  . Bronchiectasis   . Chronic sinusitis   . Diverticulosis   . GERD (gastroesophageal reflux disease)   . Hearing loss   . Hemorrhoids   . Insomnia   . Menopausal disorder   . Osteoporosis   . Palpitations   . TMJ syndrome   . Urticaria     Past Surgical History:  Procedure Laterality Date  . COLONOSCOPY    . NASAL SINUS SURGERY    . TYMPANOSTOMY    . VIDEO BRONCHOSCOPY Bilateral 07/16/2016   Procedure: VIDEO BRONCHOSCOPY WITH FLUORO;  Surgeon: Collene Gobble, MD;  Location: [facility];  Service: Cardiopulmonary;  Laterality: Bilateral;    Allergies as of 09/24/2016  Reactions   Amoxicillin-pot Clavulanate Diarrhea   Has patient had a PCN reaction causing immediate rash, facial/tongue/throat swelling, SOB or lightheadedness with hypotension:No Has patient had a PCN reaction causing severe rash involving mucus membranes or skin necrosis:No Has patient had a PCN reaction that required hospitalization:No Has patient had a PCN  reaction occurring within the last 10 years:Yes If all of the above answers are "NO", then may proceed with Cephalosporin use   Avelox [moxifloxacin Hcl In Nacl] Nausea Only   Bactrim [sulfamethoxazole-trimethoprim] Other (See Comments)   flushing   Benzonatate Other (See Comments)   felt bad, out of sorts, disoriented.   Ciprofloxacin Rash   Rash across abdomen      Medication List      acetaminophen 500 MG tablet Commonly known as:  TYLENOL Take 500 mg by mouth every 6 (six) hours as needed for moderate pain or headache.   ALPRAZolam 0.25 MG tablet Commonly known as:  XANAX Take 1 tablet (0.25 mg total) by mouth 2 (two) times daily as needed for anxiety (palpitations).   apixaban 5 MG Tabs tablet Commonly known as:  ELIQUIS Take 1 tablet (5 mg total) by mouth 2 (two) times daily.   CALCIUM-VITAMIN D PO Take 1 tablet by mouth daily.   CENTRUM SILVER PO Take 1 tablet by mouth daily.   CULTURELLE PO Take 1 tablet by mouth daily.   Estradiol 10 MCG Tabs vaginal tablet Place 1 tablet vaginally 2 (two) times a week.   fluticasone 50 MCG/ACT nasal spray Commonly known as:  FLONASE Place 2 sprays into both nostrils at bedtime as needed for allergies or rhinitis.   loratadine 10 MG tablet Commonly known as:  CLARITIN Take 10 mg by mouth daily.   metoprolol tartrate 25 MG tablet Commonly known as:  LOPRESSOR Take 0.5 tablets (12.5 mg total) by mouth daily as needed (palpitations).   mupirocin ointment 2 % Commonly known as:  BACTROBAN Apply 1 application topically 2 (two) times daily as needed (dryness).   promethazine-codeine 6.25-10 MG/5ML syrup Commonly known as:  PHENERGAN with CODEINE Take 5 mLs by mouth every 6 (six) hours as needed for cough.   ranitidine 150 MG tablet Commonly known as:  ZANTAC Take 150 mg by mouth 2 (two) times daily.   tobramycin-dexamethasone ophthalmic solution Commonly known as:  TOBRADEX Place 3 drops into the right ear 3 (three)  times daily as needed (ear irritation).   Vitamin D 1000 units capsule Take 1,000 Units by mouth daily.       Review of systems negative except as noted in HPI / PMHx or noted below:  Review of Systems  Constitutional: Negative.   HENT: Negative.   Eyes: Negative.   Respiratory: Negative.   Cardiovascular: Negative.   Gastrointestinal: Negative.   Genitourinary: Negative.   Musculoskeletal: Negative.   Skin: Negative.   Neurological: Negative.   Endo/Heme/Allergies: Negative.   Psychiatric/Behavioral: Negative.     Family History  Problem Relation Age of Onset  . Rectal cancer Mother        died age 44  . Seizures Mother   . Colon cancer Mother 13  . Cancer Mother   . Heart disease Father        dies age 7  . Hypertension Father   . Heart disease Maternal Grandfather   . AVM Brother   . Cancer Maternal Grandmother   . Cancer Paternal Uncle        multiple uncles with cancer  Social History   Social History  . Marital status: Married    Spouse name: [name]  . Number of children: 0  . Years of education: BSN   Occupational History  . retired Therapist, sports    Social History Main Topics  . Smoking status: Never Smoker  . Smokeless tobacco: Never Used  . Alcohol use 1.2 oz/week    2 Glasses of wine per week  . Drug use: No  . Sexual activity: Not on file   Other Topics Concern  . Not on file   Social History Narrative   Lives with husband   Caffeine use: 1 cup tea per day    Environmental and Social history  Lives in a house with a dry environment, no animals located inside the household, hardwood in the bedroom, plastic on both the bed and the pillow, no smoking ongoing with inside the household. She is a retired Marine scientist   Objective:   Vitals:   09/24/16 1440  BP: 124/78  Pulse: 100  Resp: 20  Temp: 98.7 F (37.1 C)   Height: 5' (152.4 cm) Weight: 104 lb 6.4 oz (47.4 kg)  Physical Exam  Constitutional: She is well-developed, well-nourished, and in  no distress.  Throat clearing, slight cough  HENT:  Head: Normocephalic. Head is without right periorbital erythema and without left periorbital erythema.  Right Ear: External ear normal. A foreign body (Tube) is present.  Left Ear: Tympanic membrane, external ear and ear canal normal.  Nose: Nose normal. No mucosal edema or rhinorrhea.  Mouth/Throat: Oropharynx is clear and moist and mucous membranes are normal. No oropharyngeal exudate.  Eyes: Conjunctivae and lids are normal. Pupils are equal, round, and reactive to light.  Neck: Trachea normal. No tracheal deviation present. No thyromegaly present.  Cardiovascular: Normal rate, regular rhythm, S1 normal, S2 normal and normal heart sounds.   No murmur heard. Pulmonary/Chest: Effort normal. No stridor. No tachypnea. No respiratory distress. She has no wheezes. She has no rales. She exhibits no tenderness.  Abdominal: Soft. She exhibits no distension and no mass. There is no hepatosplenomegaly. There is no tenderness. There is no rebound and no guarding.  Musculoskeletal: She exhibits no edema or tenderness.  Lymphadenopathy:       Head (right side): No tonsillar adenopathy present.       Head (left side): No tonsillar adenopathy present.    She has no cervical adenopathy.    She has no axillary adenopathy.  Neurological: She is alert. Gait normal.  Skin: No rash noted. She is not diaphoretic. No erythema. No pallor. Nails show no clubbing.  Psychiatric: Mood and affect normal.    Diagnostics: Allergy skin tests were performed. She did not demonstrate any hypersensitivity to a screening panel of aeroallergens or foods.   Spirometry was performed and demonstrated an FEV1 of 1.74 @ 93 % of predicted. Following the administration of nebulized albuterol her FEV1 did not improve.  Results of blood tests obtained a January 2018 identified a white blood cell count of 10.4, absolute eosinophil count 100, absolute lymphocyte count 1300,  hemoglobin 13.8, platelet 432  Results of a bronchial alveolar lavage culture from 07/17/2016 identified Pseudomonas aeruginosa  Results of a chest CT scan obtained 05/31/2016 identified the following:  Mediastinum/Nodes: No pathologically enlarged mediastinal or hilar lymph nodes. Please note that accurate exclusion of hilar adenopathy is limited on noncontrast CT scans. Esophagus is unremarkable in appearance. No axillary lymphadenopathy.  Lungs/Pleura: High-resolution images demonstrate widespread areas of cylindrical and varicose  bronchiectasis, most evident throughout the mid to lower lungs bilaterally. The areas of greatest involvement demonstrate extensive thickening of the peribronchovascular interstitium with extensive peribronchovascular micro and macronodularity, associated peribronchovascular ground-glass attenuation and regional areas of architectural distortion, all of which have increased compared to prior study 01/10/2016. Relative sparing of the upper lungs. Inspiratory and expiratory imaging is unremarkable. No confluent consolidative airspace disease. No pleural effusions.  Results of an MRI brain performed to October 2017 identified the following:  The mastoid air cells appear normal.  The paranasal sinuses appear normal.  Results of a methacholine challenge test performed 09/13/2016 did not demonstrate any bronchial hyperreactivity.   Assessment and Plan:    1. Bronchiectasis without complication ([place])   2. Other allergic rhinitis   3. LPRD (laryngopharyngeal reflux disease)     1. Allergen avoidance measures?  2. Treat inflammation:   A. continue Flonase one-2 sprays each nostril one time per day  B. start montelukast 10 mg tablet 1 time per day  3. Treat reflux:   A. consolidate ranitidine to 300 mg in evening  B. Start Dexilant 60mg  one tablet in a.m.  C. elevate head of bed  D. no late meals  E. no caffeine or chocolate or mint consumption  4. If  needed:   A. nasal saline  B. OTC antihistamine  5. Blood - IgA/G/M, IgE, anti-pneumococcal ab, Anti-tetanus ab  6. Further evaluation and treatment?  7. Return to clinic in 4 weeks  Tomia has a cough that might be multifactorial. She may have a cough based upon reflux-induced respiratory disease and some upper airway inflammation that I will treat with the therapy mentioned above and she may also have contribution from her bronchiectasis. There does not appear to be any obvious reason why she has bronchiectasis. I will check her immune function to make sure that she can mountain antigen specific antibody response against common pathogens. I will regroup with her in 4 weeks or earlier if there is a problem.  Jiles Prows, MD [facility]

## 2016-09-24 NOTE — Patient Instructions (Addendum)
  1. Allergen avoidance measures?  2. Treat inflammation:   A. continue Flonase one-2 sprays each nostril one time per day  B. start montelukast 10 mg tablet 1 time per day  3. Treat reflux:   A. consolidate ranitidine to 300 mg in evening  B. Start Dexilant 60mg  one tablet in a.m.  C. elevate head of bed  D. no late meals  E. no caffeine or chocolate or mint consumption  4. If needed:   A. nasal saline  B. OTC antihistamine  5. Blood - IgA/G/M, IgE, anti-pneumococcal ab, Anti-tetanus ab  6. Further evaluation and treatment?  7. Return to clinic in 4 weeks

## 2016-09-25 ENCOUNTER — Ambulatory Visit (INDEPENDENT_AMBULATORY_CARE_PROVIDER_SITE_OTHER): Payer: Medicare Other | Admitting: Emergency Medicine

## 2016-09-25 ENCOUNTER — Telehealth: Payer: Self-pay | Admitting: Allergy and Immunology

## 2016-09-25 ENCOUNTER — Encounter: Payer: Self-pay | Admitting: Emergency Medicine

## 2016-09-25 DIAGNOSIS — R05 Cough: Secondary | ICD-10-CM

## 2016-09-25 DIAGNOSIS — R059 Cough, unspecified: Secondary | ICD-10-CM

## 2016-09-25 DIAGNOSIS — J479 Bronchiectasis, uncomplicated: Secondary | ICD-10-CM

## 2016-09-25 NOTE — Patient Instructions (Signed)
Please continue your medications as you are currently taking them .  Try to avoid throat clearing.  Practice voice rest as you are able Your PFT's do not support asthma.  Use phenergan + codeine cough syrup as needed for cough. We will refill this when necessary Follow with Dr Lamonte Sakai in 6 months or sooner if you have any problems

## 2016-09-25 NOTE — Progress Notes (Signed)
Subjective:    Patient ID: TRINITEE HORGAN, female    DOB: 1945/09/19, 71 y.o.   MRN: [id number]  HPI Ms. Castillo is a 71 year old never smoker with a history of allergic rhinitis, GERD,urticaria. She has been followed in our office by Dr. Joya Gaskins for bronchiectasis. Her last CT scan of the chest was performed in 2011 which a person reviewed. This shows probably right middle lobe bronchiectatic changes. There are some micronodular disease could be consistent with atypical mycobacterial infection although this is never been documented. She was originally dx in setting recurrent bronchitis / PNA's. Had a bronchoscopy in 2003 - cx's negative. She has flared occasionally, last time was 2 yrs ago. She has been on scheduled BD's before, none currently, none for several years.   She does have more chronic sinus disease - does [place] reliably, flonase during allergy season, zyrtec during allergy season. GERD on prilosec, zantac prn.  Her ENT is Dr Thornell Mule, remote sinus sgy. She is treated with abx fairly frequently.   Denies any current cough, dyspnea. She is active, exercises.   Acute OV 01/02/16 -- This is an acute office visit today. Patient has a history of significant bronchiectasis, chronic cough with allergic rhinitis and GERD. Also since last visit she has been diagnosed with Paroxysmal atrial fibrillation. She had some episodes of increased GERD and also some increased rhinitis last week. She is coughing more during the day, mostly clear mucous. She underwent PFT on 8/7 that I have reviewed > shows normal airflows, normal volumes and diffusion. She restarted her prilosec and zantac and is currently taking. She is uses flonase prn, not using regularly right now. She is due for a CT chest soon. She does not use BD's  ROV 02/01/16 -- This is a follow-up visit for patient with bronchiectasis and an increase in her coughing over the last 2 months. At her last visit one month ago we restarted his nasal steroid,  briefly increased her omeprazole to every day. She underwent a CT scan of the chest on 01/10/16 that I personally reviewed. This shows slight increase in his plugging in setting of bronchiectasis and some scattered stable pulmonary nodules. Increased volume loss in the right middle lobe. She states that her cough has decreased some compared with last visit. Non-productive.   ROV 05/27/16 -- This is a follow up for pt with bronchiectasis, chronic cough (GERD + rhinitis). She was treated for community-acquired pneumonia in the end of December, main sx were fatigue. He cough was about her usual baseline. She has micronodular disease on CT scan of the chest that is consistent with Mycobacterium avium although this has never been fully established. She now feels a bit better - . She continues to cough some. Her biggest complaints are up and down energy level. She is coughing up white occasionally. She is using mucinex. Not claritin right now. She remains on Advair, unclear whether it is helpful.    ROV 07/02/16 -- patient has a history of bronchiectasis, chronic cough, GERD, rhinitis. She's continued to have guilty with cough. A CT scan of her chest was performed last 26/18 that I personally reviewed. I discussed this scan with T Parrett (whom the patient has seen more recently) > shows progression of some bronchiectasis, suspicion for [facility]. Her cough is finally improved some over the last 2 weeks. More energy. Cough remains dry. She is back on flutter valve but non- productive.  She has loratadine and flonase available, not yet back on it.  ROV 08/13/16 -- pt has chronic cough. This is in setting  bronchiectasis, GERD (untreated), allergic rhinitis. She has been on another course prednisone beginning of this month, transient relief in her cough. Eosinophils normal. FOB 3/13 consistent with UA irritation. BAL has grown pan-sensitive pseudomonas, fungal and AFB negative (BAL and brushing). Now off Advair. Her cough is currently happening daily. Never productive. She is on flonase and [place] daily, loratadine daily. She has been on allergy shots in the past, not currently. Last seen by Kozlow. She does not have any GERD, is on ranitidine. She occasionally uses PPI, not often.   ROV 09/25/16 -- patient continues to follow-up for her chronic cough with bronchiectasis, GERD, allergic rhinitis. We performed bronchoscopy that negative except for pansensitive pseudomonas.. She underwent methacholine challenge on 09/13/16 that I have personally reviewed. This did not show any hyperresponsiveness to methacholine exposure. Her spirometry was normal. She had some interval improvement but cough has never been gone. She is to continue flonase, started dexilant. Her allergy skin testing did not support allergy shots.    Review of Systems As per history of present illness     Objective:   Physical Exam Vitals:   09/25/16 1611  BP: 110/62  Pulse: 78  SpO2: 99%  Weight: 102 lb 12.8 oz (46.6 kg)  Height: _0  (1.575 m)   'Gen: Pleasant, thin woman, well-appearing, in no distress,  normal affect  ENT: No lesions,  mouth clear,  oropharynx clear, no postnasal drip  Neck: No JVD, no TMG, no carotid bruits  Lungs: No use of accessory muscles, clear without rales or rhonchi, no wheeze   Cardiovascular: RRR, heart sounds normal, no murmur or gallops, no peripheral edema  Musculoskeletal: No deformities, no cyanosis or clubbing  Neuro: alert, non focal  Skin: Warm, no lesions or rashes   01/10/16 --  COMPARISON:  11/02/2009  FINDINGS: Cardiovascular: Aorta normal caliber with minimal  atherosclerotic calcification. No pericardial effusion.  Mediastinum/Nodes: Few normal sized mediastinal lymph nodes. No thoracic adenopathy. Esophagus unremarkable. Base of cervical region significant only for a 5 mm RIGHT thyroid nodule.  Lungs/Pleura: Scarring, collapse, and granulomatous calcifications within the medial segment RIGHT middle lobe associated with bronchiectasis. Minimal scattered peribronchovascular slightly nodular infiltrates in both lobes favor chronic infection. 4 mm LEFT lower lobe nodule image 126, suspect slightly more anteriorly positioned previous exam mild posterior due to infiltrate volume loss in LEFT lower lobe. Minimal granulomatous change lingula with a 4 mm calcified granuloma image 73. Mild cylindrical bronchiectasis identified throughout both lungs. Scattered foci of mucous plugging in LEFT lower lobe. No segmental airspace consolidation, pleural effusion or pneumothorax. Mild scarring at apices bilaterally, stable. Subpleural nodule versus adjacent tiny nodules RIGHT lobe image 74 overall unchanged. Scattered calcified granulomata RIGHT lung. 7 mm subpleural nodule posterior RIGHT lower lobe image 91 stable. Underlying diffuse hyperinflation.  Upper Abdomen: Diverticula at splenic flexure of colon. 5 mm low-attenuation focus lateral segment LEFT lobe liver image 132 stable. Remaining visualized upper abdomen unremarkable  Musculoskeletal: No acute osseous findings.  IMPRESSION: Hyperinflated lungs with old granulomatous disease changes, bronchiectasis, scattered mucous plugging, and scattered stable pulmonary nodules.  Volume loss and scarring in RIGHT middle lobe increased since previous exam.  Scattered mild slightly nodular peribronchovascular infiltrates in both lungs, nonspecific but suggesting chronic infection question MAC.  Colonic diverticulosis at splenic flexure of colon.  Aortic atherosclerosis         Assessment & Plan:  Bronchiectasis without acute exacerbation (HCC) Her current cough  is upper airway in nature. If she develops purulent mucous, significant secretions, evidence of bronchiectasis flare that she knows to notify me. Of note she is colonized with pseudomonas and will need to be treated for this if she flares.  Cough Upper airway in nature. Her methacholine challenge was negative. Her allergy testing did not show an indication for immunotherapy. She needs to be treated for GERD, rhinitis. Clearly will need to be able to distinguish between her upper airway irritation and her true bronchiectasis.  Please continue your medications as you are currently taking them .  Try to avoid throat clearing.  Practice voice rest as you are able Your PFT's do not support asthma.  Use phenergan + codeine cough syrup as needed for cough. We will refill this when necessary Follow with Dr Lamonte Sakai in 6 months or sooner if you have any problems  Baltazar Apo, MD, PhD 09/25/2016, 5:13 PM [facility] [phone] or if no answer [phone]

## 2016-09-25 NOTE — Telephone Encounter (Signed)
Dexilant has been approved. Pharmacy and patient notified.

## 2016-09-25 NOTE — Telephone Encounter (Signed)
Please have her pick up enough samples until her next visit and then we can discuss response to treatment and how to approach the insurance company.

## 2016-09-25 NOTE — Assessment & Plan Note (Signed)
Upper airway in nature. Her methacholine challenge was negative. Her allergy testing did not show an indication for immunotherapy. She needs to be treated for GERD, rhinitis. Clearly will need to be able to distinguish between her upper airway irritation and her true bronchiectasis.  Please continue your medications as you are currently taking them .  Try to avoid throat clearing.  Practice voice rest as you are able Your PFT's do not support asthma.  Use phenergan + codeine cough syrup as needed for cough. We will refill this when necessary Follow with Dr Lamonte Sakai in 6 months or sooner if you have any problems

## 2016-09-25 NOTE — Telephone Encounter (Signed)
Pt called and said that they Dexlant 60mg  works but ins will not cover or need auth. [id number].

## 2016-09-25 NOTE — Assessment & Plan Note (Signed)
Her current cough is upper airway in nature. If she develops purulent mucous, significant secretions, evidence of bronchiectasis flare that she knows to notify me. Of note she is colonized with pseudomonas and will need to be treated for this if she flares.

## 2016-09-25 NOTE — Telephone Encounter (Signed)
Patient's insurance (Medicare/Tricare) will not cover Dexilant. Please advise?

## 2016-09-26 LAB — TETANUS ANTIBODY, IGG: Tetanus Ab, IgG: >7 IU/mL (ref <0.10)

## 2016-09-28 LAB — IGG, IGA, IGM
IGA/IMMUNOGLOBULIN A, SERUM: 185 mg/dL (ref 64–422)
IgG (Immunoglobin G), Serum: 840 mg/dL (ref 700–1600)
IgM (Immunoglobulin M), Srm: 54 mg/dL (ref 26–217)

## 2016-09-28 LAB — IGE: IgE (Immunoglobulin E), Serum: 25 IU/mL (ref 0–100)

## 2016-10-01 LAB — CBC WITH DIFFERENTIAL/PLATELET
Basophils Absolute: 0 10*3/uL (ref 0.0–0.2)
Basos: 0 %
EOS (ABSOLUTE): 0.2 10*3/uL (ref 0.0–0.4)
EOS: 2 %
HEMATOCRIT: 37.7 % (ref 34.0–46.6)
HEMOGLOBIN: 13 g/dL (ref 11.1–15.9)
Immature Grans (Abs): 0 10*3/uL (ref 0.0–0.1)
Immature Granulocytes: 0 %
LYMPHS: 18 %
Lymphocytes Absolute: 1.5 10*3/uL (ref 0.7–3.1)
MCH: 30.4 pg (ref 26.6–33.0)
MCHC: 34.5 g/dL (ref 31.5–35.7)
MCV: 88 fL (ref 79–97)
MONOCYTES: 8 %
Monocytes Absolute: 0.7 10*3/uL (ref 0.1–0.9)
Neutrophils Absolute: 6 10*3/uL (ref 1.4–7.0)
Neutrophils: 72 %
Platelets: 411 10*3/uL — ABNORMAL HIGH (ref 150–379)
RBC: 4.28 x10E6/uL (ref 3.77–5.28)
RDW: 12.8 % (ref 12.3–15.4)
WBC: 8.4 10*3/uL (ref 3.4–10.8)

## 2016-10-01 LAB — PNEUMOCOCCAL IM (14 SEROTYPE)
PNEUMO AB TYPE 19 (19F): 26.3 ug/mL (ref >1.3)
PNEUMO AB TYPE 23 (23F): 1.2 ug/mL — AB (ref >1.3)
PNEUMO AB TYPE 26 (6B): 3.7 ug/mL (ref >1.3)
PNEUMO AB TYPE 51 (7F): 7.1 ug/mL (ref >1.3)
PNEUMO AB TYPE 68 (9V): 6.3 ug/mL (ref >1.3)
PNEUMO AB TYPE 9 (9N): 5.9 ug/mL (ref >1.3)
Pneumo Ab Type 1*: >33.8 ug/mL (ref >1.3)
Pneumo Ab Type 12 (12F)*: 1.3 ug/mL — ABNORMAL LOW (ref >1.3)
Pneumo Ab Type 3*: 0.8 ug/mL — ABNORMAL LOW (ref >1.3)
Pneumo Ab Type 4*: 0.7 ug/mL — ABNORMAL LOW (ref >1.3)
Pneumo Ab Type 56 (18C)*: 4.6 ug/mL (ref >1.3)
Pneumo Ab Type 57 (19A)*: 11.2 ug/mL (ref >1.3)
Pneumo Ab Type 8*: 2.7 ug/mL (ref >1.3)

## 2016-10-03 DIAGNOSIS — J309 Allergic rhinitis, unspecified: Secondary | ICD-10-CM | POA: Diagnosis not present

## 2016-10-03 DIAGNOSIS — H6121 Impacted cerumen, right ear: Secondary | ICD-10-CM | POA: Diagnosis not present

## 2016-10-03 DIAGNOSIS — H6981 Other specified disorders of Eustachian tube, right ear: Secondary | ICD-10-CM | POA: Diagnosis not present

## 2016-10-03 DIAGNOSIS — H903 Sensorineural hearing loss, bilateral: Secondary | ICD-10-CM | POA: Diagnosis not present

## 2016-10-22 ENCOUNTER — Ambulatory Visit (INDEPENDENT_AMBULATORY_CARE_PROVIDER_SITE_OTHER): Payer: Medicare Other | Admitting: Allergy and Immunology

## 2016-10-22 ENCOUNTER — Encounter: Payer: Self-pay | Admitting: Allergy and Immunology

## 2016-10-22 VITALS — BP 100/60 | HR 96 | Resp 14

## 2016-10-22 DIAGNOSIS — J479 Bronchiectasis, uncomplicated: Secondary | ICD-10-CM | POA: Diagnosis not present

## 2016-10-22 DIAGNOSIS — K219 Gastro-esophageal reflux disease without esophagitis: Secondary | ICD-10-CM | POA: Diagnosis not present

## 2016-10-22 DIAGNOSIS — J3089 Other allergic rhinitis: Secondary | ICD-10-CM

## 2016-10-22 NOTE — Patient Instructions (Addendum)
  1. Continue to Treat inflammation:   A. Flonase one-2 sprays each nostril one time per day  B. montelukast 10 mg tablet 1 time per day  3. Treat reflux:   A. ranitidine 300 mg in evening  B. Dexilant 60mg  one tablet in a.m.  C. elevate head of bed  D. no late meals  E. no caffeine or chocolate or mint consumption  4. If needed:   A. nasal saline  B. OTC antihistamine  5. Return to clinic in 8 weeks

## 2016-10-22 NOTE — Progress Notes (Signed)
Follow-up Note  Referring Provider: Cassandria Anger, MD Primary Provider: Cassandria Anger, MD Date of Office Visit: 10/22/2016  Subjective:   Monica [name] (DOB: June 26, 1945) is a 71 y.o. female who returns to the [facility] on 10/22/2016 in re-evaluation of the following:  HPI: [name] returns to this clinic in evaluation of her respiratory tract symptoms addressed during her initial evaluation of 24 Sep 2016 at which point in time we aggressively treated her for reflux and respiratory tract inflammation of her upper airway.  She is really not that much better. She has noticed very little improvement regarding her cough. However, she has had an excellent response regarding any issues with her upper airways while using a combination of Flonase and montelukast.  Allergies as of 10/22/2016      Reactions   Amoxicillin-pot Clavulanate Diarrhea   Has patient had a PCN reaction causing immediate rash, facial/tongue/throat swelling, SOB or lightheadedness with hypotension:No Has patient had a PCN reaction causing severe rash involving mucus membranes or skin necrosis:No Has patient had a PCN reaction that required hospitalization:No Has patient had a PCN reaction occurring within the last 10 years:Yes If all of the above answers are "NO", then may proceed with Cephalosporin use   Avelox [moxifloxacin Hcl In Nacl] Nausea Only   Bactrim [sulfamethoxazole-trimethoprim] Other (See Comments)   flushing   Benzonatate Other (See Comments)   felt bad, out of sorts, disoriented.   Ciprofloxacin Rash   Rash across abdomen      Medication List      acetaminophen 500 MG tablet Commonly known as:  TYLENOL Take 500 mg by mouth every 6 (six) hours as needed for moderate pain or headache.   ALPRAZolam 0.25 MG tablet Commonly known as:  XANAX Take 1 tablet (0.25 mg total) by mouth 2 (two) times daily as needed for anxiety (palpitations).   apixaban 5 MG Tabs  tablet Commonly known as:  ELIQUIS Take 1 tablet (5 mg total) by mouth 2 (two) times daily.   CALCIUM-VITAMIN D PO Take 1 tablet by mouth daily.   CENTRUM SILVER PO Take 1 tablet by mouth daily.   CULTURELLE PO Take 1 tablet by mouth daily.   dexlansoprazole 60 MG capsule Commonly known as:  DEXILANT Take 1 capsule (60 mg total) by mouth daily.   Estradiol 10 MCG Tabs vaginal tablet Place 1 tablet vaginally 2 (two) times a week.   fluticasone 50 MCG/ACT nasal spray Commonly known as:  FLONASE Place 2 sprays into both nostrils at bedtime as needed for allergies or rhinitis.   loratadine 10 MG tablet Commonly known as:  CLARITIN Take 10 mg by mouth daily.   metoprolol tartrate 25 MG tablet Commonly known as:  LOPRESSOR Take 0.5 tablets (12.5 mg total) by mouth daily as needed (palpitations).   montelukast 10 MG tablet Commonly known as:  SINGULAIR Take 1 tablet (10 mg total) by mouth at bedtime.   mupirocin ointment 2 % Commonly known as:  BACTROBAN Apply 1 application topically 2 (two) times daily as needed (dryness).   promethazine-codeine 6.25-10 MG/5ML syrup Commonly known as:  PHENERGAN with CODEINE Take 5 mLs by mouth every 6 (six) hours as needed for cough.   ranitidine 150 MG tablet Commonly known as:  ZANTAC Take 150 mg by mouth 2 (two) times daily.   tobramycin-dexamethasone ophthalmic solution Commonly known as:  TOBRADEX Place 3 drops into the right ear 3 (three) times daily as needed (ear irritation).  Vitamin D 1000 units capsule Take 1,000 Units by mouth daily.       Past Medical History:  Diagnosis Date  . Adenomatous colon polyp 10/2003  . Allergic rhinitis   . Allergy    SEASONAL  . Bronchiectasis   . Chronic sinusitis   . Diverticulosis   . GERD (gastroesophageal reflux disease)   . Hearing loss   . Hemorrhoids   . Insomnia   . Menopausal disorder   . Osteoporosis   . Palpitations   . TMJ syndrome   . Urticaria     Past  Surgical History:  Procedure Laterality Date  . COLONOSCOPY    . NASAL SINUS SURGERY    . TYMPANOSTOMY    . VIDEO BRONCHOSCOPY Bilateral 07/16/2016   Procedure: VIDEO BRONCHOSCOPY WITH FLUORO;  Surgeon: Collene Gobble, MD;  Location: [facility];  Service: Cardiopulmonary;  Laterality: Bilateral;    Review of systems negative except as noted in HPI / PMHx or noted below:  Review of Systems  Constitutional: Negative.   HENT: Negative.   Eyes: Negative.   Respiratory: Negative.   Cardiovascular: Negative.   Gastrointestinal: Negative.   Genitourinary: Negative.   Musculoskeletal: Negative.   Skin: Negative.   Neurological: Negative.   Endo/Heme/Allergies: Negative.   Psychiatric/Behavioral: Negative.      Objective:   Vitals:   10/22/16 0942  BP: 100/60  Pulse: 96  Resp: 14          Physical Exam  Constitutional: She is well-developed, well-nourished, and in no distress.  HENT:  Head: Normocephalic.  Right Ear: External ear normal.  Left Ear: External ear normal.  Nose: Nose normal. No mucosal edema or rhinorrhea.  Mouth/Throat: Uvula is midline, oropharynx is clear and moist and mucous membranes are normal. No oropharyngeal exudate.  Hearing aids  Eyes: Conjunctivae are normal.  Neck: Trachea normal. No tracheal tenderness present. No tracheal deviation present. No thyromegaly present.  Cardiovascular: Normal rate, regular rhythm, S1 normal, S2 normal and normal heart sounds.   No murmur heard. Pulmonary/Chest: Breath sounds normal. No stridor. No respiratory distress. She has no wheezes. She has no rales.  Musculoskeletal: She exhibits no edema.  Lymphadenopathy:       Head (right side): No tonsillar adenopathy present.       Head (left side): No tonsillar adenopathy present.    She has no cervical adenopathy.  Neurological: She is alert. Gait normal.  Skin: No rash noted. She is not diaphoretic. No erythema. Nails show no clubbing.  Psychiatric: Mood and  affect normal.    Diagnostics: Results of blood tests obtained 09/25/2016 identified IgG 840, IgM 54, IgA 185 MG/DL, white blood cell count 8.4 with an absolute eosinophil count of 200, absolute lymphocyte count 1500, hemoglobin 13.0, platelet 411, multiple serotypes of pneumococcus with good antibody titers, and a tetanus anti-toxo antibody level of greater than 7.0 international units/mL.   Spirometry was performed and demonstrated an FEV1 of 1.63 at 81 % of predicted.  Assessment and Plan:   1. Bronchiectasis without complication ([place])   2. Other allergic rhinitis   3. LPRD (laryngopharyngeal reflux disease)     1. Continue to Treat inflammation:   A. Flonase one-2 sprays each nostril one time per day  B. montelukast 10 mg tablet 1 time per day  3. Treat reflux:   A. ranitidine 300 mg in evening  B. Dexilant 60mg  one tablet in a.m.  C. elevate head of bed  D. no late meals  E.  no caffeine or chocolate or mint consumption  4. If needed:   A. nasal saline  B. OTC antihistamine  5. Return to clinic in 8 weeks  Unfortunately Monica [name] has not really had much improvement regarding her chronic cough in the face of utilizing aggressive therapy directed against reflux although her upper airway inflammatory symptoms have improved with her current therapy. I would like for her to complete a full 12 months of aggressive treatment directed against reflux as noted above. Thus, I will see her back in this clinic in 8 weeks. If she has no improvement then we will have her consolidate treatment for reflux and assume that the majority of her cough may be related to her bronchiectasis.  Allena Katz, MD Allergy / [facility] [place]

## 2016-10-28 ENCOUNTER — Telehealth: Payer: Self-pay | Admitting: Allergy and Immunology

## 2016-10-28 NOTE — Telephone Encounter (Signed)
Pt advised. Made pt aware to call us to update on symptoms.

## 2016-10-28 NOTE — Telephone Encounter (Signed)
Patient said she is on Dexilant and believes she is having side effects.

## 2016-10-28 NOTE — Telephone Encounter (Signed)
Patient states she went out last week and had a glass of wine since then she has been developing symptoms. She has had loose stools once the last 2 days and feels bloated and has nausea. Patient stated she wants to stop for 2 days then resume. Advised patient that would be fine and see if that helps Dr Neldon Mc advise if ok to stop or should she stop another medication she is not sure if it is the [place].

## 2016-10-28 NOTE — Telephone Encounter (Signed)
Please stay on DEXilant to see if this issue is related to this medication or is related to some other issue. If it is related to some other issue then it will resolve while she remains on DEXilant.

## 2016-10-31 ENCOUNTER — Ambulatory Visit (INDEPENDENT_AMBULATORY_CARE_PROVIDER_SITE_OTHER): Payer: Medicare Other | Admitting: Internal Medicine

## 2016-10-31 ENCOUNTER — Encounter: Payer: Self-pay | Admitting: Internal Medicine

## 2016-10-31 DIAGNOSIS — R059 Cough, unspecified: Secondary | ICD-10-CM

## 2016-10-31 DIAGNOSIS — J479 Bronchiectasis, uncomplicated: Secondary | ICD-10-CM

## 2016-10-31 DIAGNOSIS — K588 Other irritable bowel syndrome: Secondary | ICD-10-CM

## 2016-10-31 DIAGNOSIS — R05 Cough: Secondary | ICD-10-CM

## 2016-10-31 NOTE — Assessment & Plan Note (Signed)
Gluten free trial (no wheat products) for 4-6 weeks. OK to use gluten-free bread and gluten-free pasta.     

## 2016-10-31 NOTE — Assessment & Plan Note (Signed)
Elevate head on the GERD wedge

## 2016-10-31 NOTE — Patient Instructions (Addendum)
Gluten free trial (no wheat products) for 4-6 weeks. OK to use gluten-free bread and gluten-free pasta.  GERD wedge

## 2016-10-31 NOTE — Progress Notes (Signed)
Subjective:  Patient ID: Monica [name], female    DOB: [birth year]  Age: 71 y.o. MRN: [id number]  CC: No chief complaint on file.   HPI Monica [name] presents for chronic cough, bronchiectases, GERD f/u  Outpatient Medications Prior to Visit  Medication Sig Dispense Refill  . acetaminophen (TYLENOL) 500 MG tablet Take 500 mg by mouth every 6 (six) hours as needed for moderate pain or headache.    . ALPRAZolam (XANAX) 0.25 MG tablet Take 1 tablet (0.25 mg total) by mouth 2 (two) times daily as needed for anxiety (palpitations). 60 tablet 1  . apixaban (ELIQUIS) 5 MG TABS tablet Take 1 tablet (5 mg total) by mouth 2 (two) times daily. 180 tablet 3  . CALCIUM-VITAMIN D PO Take 1 tablet by mouth daily.    . Cholecalciferol (VITAMIN D) 1000 UNITS capsule Take 1,000 Units by mouth daily.      Marland Kitchen dexlansoprazole (DEXILANT) 60 MG capsule Take 1 capsule (60 mg total) by mouth daily. 30 capsule 5  . Estradiol 10 MCG TABS Place 1 tablet vaginally 2 (two) times a week.     . fluticasone (FLONASE) 50 MCG/ACT nasal spray Place 2 sprays into both nostrils at bedtime as needed for allergies or rhinitis.    . Lactobacillus Rhamnosus, GG, (CULTURELLE PO) Take 1 tablet by mouth daily.    Marland Kitchen loratadine (CLARITIN) 10 MG tablet Take 10 mg by mouth daily as needed.     . metoprolol tartrate (LOPRESSOR) 25 MG tablet Take 0.5 tablets (12.5 mg total) by mouth daily as needed (palpitations). 30 tablet 3  . montelukast (SINGULAIR) 10 MG tablet Take 1 tablet (10 mg total) by mouth at bedtime. 30 tablet 5  . Multiple Vitamins-Minerals (CENTRUM SILVER PO) Take 1 tablet by mouth daily.     . mupirocin ointment (BACTROBAN) 2 % Apply 1 application topically 2 (two) times daily as needed (dryness).     . ranitidine (ZANTAC) 300 MG tablet Take 300 mg by mouth 2 (two) times daily.     Marland Kitchen tobramycin-dexamethasone (TOBRADEX) ophthalmic solution Place 3 drops into the right ear 3 (three) times daily as needed (ear irritation).      . promethazine-codeine (PHENERGAN WITH CODEINE) 6.25-10 MG/5ML syrup Take 5 mLs by mouth every 6 (six) hours as needed for cough. 200 mL 0   No facility-administered medications prior to visit.     ROS Review of Systems  Constitutional: Negative for activity change, appetite change, chills, fatigue and unexpected weight change.  HENT: Negative for congestion, mouth sores and sinus pressure.   Eyes: Negative for visual disturbance.  Respiratory: Positive for cough. Negative for chest tightness.   Gastrointestinal: Negative for abdominal pain and nausea.  Genitourinary: Negative for difficulty urinating, frequency and vaginal pain.  Musculoskeletal: Negative for back pain and gait problem.  Skin: Negative for pallor and rash.  Neurological: Negative for dizziness, tremors, weakness, numbness and headaches.  Psychiatric/Behavioral: Negative for confusion, sleep disturbance and suicidal ideas.    Objective:  BP 110/64 (BP Location: Left Arm, Patient Position: Sitting, Cuff Size: Normal)   Pulse 80   Temp 97.7 F (36.5 C) (Oral)   Ht 5\' 2"  (1.575 m)   Wt 102 lb (46.3 kg)   SpO2 99%   BMI 18.66 kg/m   BP Readings from Last 3 Encounters:  10/31/16 110/64  10/22/16 100/60  09/25/16 110/62    Wt Readings from Last 3 Encounters:  10/31/16 102 lb (46.3 kg)  09/25/16 102 lb 12.8  oz (46.6 kg)  09/24/16 104 lb 6.4 oz (47.4 kg)    Physical Exam  Constitutional: She appears well-developed. No distress.  HENT:  Head: Normocephalic.  Right Ear: External ear normal.  Left Ear: External ear normal.  Nose: Nose normal.  Mouth/Throat: Oropharynx is clear and moist.  Eyes: Conjunctivae are normal. Pupils are equal, round, and reactive to light. Right eye exhibits no discharge. Left eye exhibits no discharge.  Neck: Normal range of motion. Neck supple. No JVD present. No tracheal deviation present. No thyromegaly present.  Cardiovascular: Normal rate, regular rhythm and normal heart  sounds.   Pulmonary/Chest: No stridor. No respiratory distress. She has no wheezes.  Abdominal: Soft. Bowel sounds are normal. She exhibits no distension and no mass. There is no tenderness. There is no rebound and no guarding.  Musculoskeletal: She exhibits no edema or tenderness.  Lymphadenopathy:    She has no cervical adenopathy.  Neurological: She displays normal reflexes. No cranial nerve deficit. She exhibits normal muscle tone. Coordination normal.  Skin: No rash noted. No erythema.  Psychiatric: She has a normal mood and affect. Her behavior is normal. Judgment and thought content normal.  Thin  Lab Results  Component Value Date   WBC 8.4 09/25/2016   HGB 13.0 09/25/2016   HCT 37.7 09/25/2016   PLT 411 (H) 09/25/2016   GLUCOSE 98 05/13/2016   CHOL 157 04/13/2014   TRIG 71.0 04/13/2014   HDL 65.60 04/13/2014   LDLCALC 77 04/13/2014   ALT 17 09/28/2014   AST 28 09/28/2014   NA 137 05/13/2016   K 4.1 05/13/2016   CL 101 05/13/2016   CREATININE 0.70 05/13/2016   BUN 16 05/13/2016   CO2 28 05/13/2016   TSH 4.381 09/17/2015    No results found.  Assessment & Plan:   There are no diagnoses linked to this encounter. I have discontinued [name]'s promethazine-codeine. I am also having her maintain her Vitamin D, Estradiol, Multiple Vitamins-Minerals (CENTRUM SILVER PO), (Lactobacillus Rhamnosus, GG, (CULTURELLE PO)), acetaminophen, ALPRAZolam, tobramycin-dexamethasone, loratadine, metoprolol tartrate, mupirocin ointment, CALCIUM-VITAMIN D PO, fluticasone, ranitidine, apixaban, montelukast, and dexlansoprazole.  No orders of the defined types were placed in this encounter.    Follow-up: No Follow-up on file.  Walker Kehr, MD

## 2016-11-14 ENCOUNTER — Ambulatory Visit (INDEPENDENT_AMBULATORY_CARE_PROVIDER_SITE_OTHER): Payer: Medicare Other | Admitting: Cardiovascular Disease

## 2016-11-14 ENCOUNTER — Encounter: Payer: Self-pay | Admitting: Cardiovascular Disease

## 2016-11-14 VITALS — BP 118/64 | HR 84 | Ht 62.0 in | Wt 101.0 lb

## 2016-11-14 DIAGNOSIS — I480 Paroxysmal atrial fibrillation: Secondary | ICD-10-CM | POA: Diagnosis not present

## 2016-11-14 NOTE — Progress Notes (Signed)
Cardiology Office Note    Date:  11/14/2016   ID:  Monica [name], DOB December 24, 1945, MRN [id number]  Requesting physician:  Cassandria Anger, MD  Cardiologist:   Sanda Klein, MD    Chief Complaint  Patient presents with  . Follow-up    3 months    History of Present Illness:  Monica [name] is a 71 y.o. retired Marine scientist who presents forFollow-up after initiation of anticoagulant therapy for atrial fibrillation.   She has had infrequent palpitations, but these are generally brief. When she has checked them with her smart phone device they are usually PVCs. She has had at least 2 episodes of confirmed atrial fibrillation in May 2017 and April 2018. She has no evidence of structural heart disease and has a relatively small left atrium. She has never had embolic events.  She has not had any bleeding issues or gastrointestinal upset since starting Eliquis. She has not noticed excessive bruising. She does complain of fatigue, but does not have other symptoms of hypothyroidism, obstructive sleep apnea and depression. Hemoglobin level checked roughly 6 weeks ago was normal at 13. She had normal TSH in 2017.  Other comorbid conditions include bronchiectasis, gastroesophageal reflux disease, irritable bowel syndrome, osteoporosis, chronic sinusitis and mild anxiety disorder, none of which are currently a big problem.  Past Medical History:  Diagnosis Date  . Adenomatous colon polyp 10/2003  . Allergic rhinitis   . Allergy    SEASONAL  . Bronchiectasis   . Chronic sinusitis   . Diverticulosis   . GERD (gastroesophageal reflux disease)   . Hearing loss   . Hemorrhoids   . Insomnia   . Menopausal disorder   . Osteoporosis   . Palpitations   . TMJ syndrome   . Urticaria     Past Surgical History:  Procedure Laterality Date  . COLONOSCOPY    . NASAL SINUS SURGERY    . TYMPANOSTOMY    . VIDEO BRONCHOSCOPY Bilateral 07/16/2016   Procedure: VIDEO BRONCHOSCOPY WITH FLUORO;   Surgeon: Collene Gobble, MD;  Location: [facility];  Service: Cardiopulmonary;  Laterality: Bilateral;    Current Medications: Outpatient Medications Prior to Visit  Medication Sig Dispense Refill  . acetaminophen (TYLENOL) 500 MG tablet Take 500 mg by mouth every 6 (six) hours as needed for moderate pain or headache.    . ALPRAZolam (XANAX) 0.25 MG tablet Take 1 tablet (0.25 mg total) by mouth 2 (two) times daily as needed for anxiety (palpitations). 60 tablet 1  . apixaban (ELIQUIS) 5 MG TABS tablet Take 1 tablet (5 mg total) by mouth 2 (two) times daily. 180 tablet 3  . CALCIUM-VITAMIN D PO Take 1 tablet by mouth daily.    . Cholecalciferol (VITAMIN D) 1000 UNITS capsule Take 1,000 Units by mouth daily.      Marland Kitchen dexlansoprazole (DEXILANT) 60 MG capsule Take 1 capsule (60 mg total) by mouth daily. 30 capsule 5  . Estradiol 10 MCG TABS Place 1 tablet vaginally 2 (two) times a week.     . fluticasone (FLONASE) 50 MCG/ACT nasal spray Place 2 sprays into both nostrils at bedtime as needed for allergies or rhinitis.    . Lactobacillus Rhamnosus, GG, (CULTURELLE PO) Take 1 tablet by mouth daily.    Marland Kitchen loratadine (CLARITIN) 10 MG tablet Take 10 mg by mouth daily as needed.     . metoprolol tartrate (LOPRESSOR) 25 MG tablet Take 0.5 tablets (12.5 mg total) by mouth daily as needed (palpitations). [facility]  tablet 3  . montelukast (SINGULAIR) 10 MG tablet Take 1 tablet (10 mg total) by mouth at bedtime. 30 tablet 5  . Multiple Vitamins-Minerals (CENTRUM SILVER PO) Take 1 tablet by mouth daily.     . mupirocin ointment (BACTROBAN) 2 % Apply 1 application topically 2 (two) times daily as needed (dryness).     . ranitidine (ZANTAC) 300 MG tablet Take 300 mg by mouth 2 (two) times daily.     Marland Kitchen tobramycin-dexamethasone (TOBRADEX) ophthalmic solution Place 3 drops into the right ear 3 (three) times daily as needed (ear irritation).      No facility-administered medications prior to visit.      Allergies:    Amoxicillin-pot clavulanate; Avelox [moxifloxacin hcl in nacl]; Bactrim [sulfamethoxazole-trimethoprim]; Benzonatate; and Ciprofloxacin   Social History   Social History  . Marital status: Married    Spouse name: [name]  . Number of children: 0  . Years of education: BSN   Occupational History  . retired Therapist, sports    Social History Main Topics  . Smoking status: Never Smoker  . Smokeless tobacco: Never Used  . Alcohol use 1.2 oz/week    2 Glasses of wine per week  . Drug use: No  . Sexual activity: Not Asked   Other Topics Concern  . None   Social History Narrative   Lives with husband   Caffeine use: 1 cup tea per day     Family History:  The patient's family history includes AVM in her brother; Cancer in her maternal grandmother, mother, and paternal uncle; Colon cancer (age of onset: 43) in her mother; Heart disease in her father and maternal grandfather; Hypertension in her father; Rectal cancer in her mother; Seizures in her mother.   ROS:   Please see the history of present illness.    ROS All other systems reviewed and are negative.   PHYSICAL EXAM:   VS:  BP 118/64   Pulse 84   Ht 5\' 2"  (1.575 m)   Wt 101 lb (45.8 kg)   BMI 18.47 kg/m     General: Alert, oriented x3, no distress Head: no evidence of trauma, PERRL, EOMI, no exophtalmos or lid lag, no myxedema, no xanthelasma; normal ears, nose and oropharynx Neck: normal jugular venous pulsations and no hepatojugular reflux; brisk carotid pulses without delay and no carotid bruits Chest: clear to auscultation, no signs of consolidation by percussion or palpation, normal fremitus, symmetrical and full respiratory excursions Cardiovascular: normal position and quality of the apical impulse, regular rhythm, normal first and second heart sounds, no murmurs, rubs or gallops Abdomen: no tenderness or distention, no masses by palpation, no abnormal pulsatility or arterial bruits, normal bowel sounds, no  hepatosplenomegaly Extremities: no clubbing, cyanosis or edema; 2+ radial, ulnar and brachial pulses bilaterally; 2+ right femoral, posterior tibial and dorsalis pedis pulses; 2+ left femoral, posterior tibial and dorsalis pedis pulses; no subclavian or femoral bruits Neurological: grossly nonfocal Psych: normal mood and affect  Wt Readings from Last 3 Encounters:  11/14/16 101 lb (45.8 kg)  10/31/16 102 lb (46.3 kg)  09/25/16 102 lb 12.8 oz (46.6 kg)      Studies/Labs Reviewed:   EKG:  EKG is not ordered today.    Recent Labs: 05/13/2016: BUN 16; Creatinine, Ser 0.70; Potassium 4.1; Sodium 137 09/25/2016: Hemoglobin 13.0; Platelets 411   Lipid Panel    Component Value Date/Time   CHOL 157 04/13/2014 1156   TRIG 71.0 04/13/2014 1156   HDL 65.60 04/13/2014 1156  CHOLHDL 2 04/13/2014 1156   VLDL 14.2 04/13/2014 1156   LDLCALC 77 04/13/2014 1156     ASSESSMENT:    1. PAF (paroxysmal atrial fibrillation) (HCC)      PLAN:   She is tolerating anticoagulation without overt complications. Clinically she does not have signs of anemia and a relatively recent hemoglobin was normal. I don't think the Eliquis is the cause for her fatigue. She has a follow-up visit with Dr. Alain Marion in September. Consider repeating hemoglobin and TSH at that time if she still has the same complaint.  Medication Adjustments/Labs and Tests Ordered: Current medicines are reviewed at length with the patient today.  Concerns regarding medicines are outlined above.  Medication changes, Labs and Tests ordered today are listed in the Patient Instructions below. Patient Instructions  Dr Sallyanne Kuster recommends that you schedule a follow-up appointment in 6 months. You will receive a reminder letter in the mail two months in advance. If you don't receive a letter, please call our office to schedule the follow-up appointment.  If you need a refill on your cardiac medications before your next appointment, please  call your pharmacy.    Signed, Sanda Klein, MD  11/14/2016 9:26 AM    [facility] [address] [place] [place], [place], [place]  [address] Phone: [phone]; Fax: [phone]

## 2016-11-14 NOTE — Patient Instructions (Signed)
Dr Croitoru recommends that you schedule a follow-up appointment in 6 months. You will receive a reminder letter in the mail two months in advance. If you don't receive a letter, please call our office to schedule the follow-up appointment.  If you need a refill on your cardiac medications before your next appointment, please call your pharmacy. 

## 2016-11-25 ENCOUNTER — Other Ambulatory Visit: Payer: Self-pay

## 2016-12-10 DIAGNOSIS — N952 Postmenopausal atrophic vaginitis: Secondary | ICD-10-CM | POA: Diagnosis not present

## 2016-12-10 DIAGNOSIS — Z01419 Encounter for gynecological examination (general) (routine) without abnormal findings: Secondary | ICD-10-CM | POA: Diagnosis not present

## 2016-12-10 DIAGNOSIS — Z124 Encounter for screening for malignant neoplasm of cervix: Secondary | ICD-10-CM | POA: Diagnosis not present

## 2016-12-22 ENCOUNTER — Other Ambulatory Visit: Payer: Self-pay | Admitting: Cardiovascular Disease

## 2016-12-24 ENCOUNTER — Other Ambulatory Visit: Payer: Self-pay | Admitting: Cardiovascular Disease

## 2016-12-25 ENCOUNTER — Ambulatory Visit (INDEPENDENT_AMBULATORY_CARE_PROVIDER_SITE_OTHER): Payer: Medicare Other | Admitting: Allergy and Immunology

## 2016-12-25 ENCOUNTER — Encounter: Payer: Self-pay | Admitting: Allergy and Immunology

## 2016-12-25 VITALS — BP 108/64 | HR 80 | Resp 18

## 2016-12-25 DIAGNOSIS — J479 Bronchiectasis, uncomplicated: Secondary | ICD-10-CM | POA: Diagnosis not present

## 2016-12-25 DIAGNOSIS — K219 Gastro-esophageal reflux disease without esophagitis: Secondary | ICD-10-CM

## 2016-12-25 DIAGNOSIS — J3089 Other allergic rhinitis: Secondary | ICD-10-CM

## 2016-12-25 NOTE — Patient Instructions (Addendum)
  1. Continue to Treat inflammation:   A. Flonase one-2 sprays each nostril one time per day  B. montelukast 10 mg tablet 1 time per day  3. Treat reflux:   A. ranitidine 300 mg in evening  B. discontinued Dexilant  C. elevate head of bed  D. no late meals  E. no caffeine or chocolate or mint consumption  4. If needed:   A. nasal saline  B. OTC antihistamine  5. Revisit with Dr. Benjamine Mola to examine throat  6. Follow-up with Dr. Lamonte Sakai  7. Obtain fall flu vaccine  8. Return to clinic in 1 year or earlier if problem

## 2016-12-25 NOTE — Progress Notes (Signed)
Follow-up Note  Referring Provider: Cassandria Anger, MD Primary Provider: Cassandria Anger, MD Date of Office Visit: 12/25/2016  Subjective:   Monica [name] (DOB: [birth year]) is a 71 y.o. female who returns to the [facility] [place] on 12/25/2016 in re-evaluation of the following:  HPI: Monica [name] returns to this clinic in reevaluation of her cough. During her last evaluation of 10/22/2016 we had her aggressively treat reflux-induced respiratory disease while continuing to treat upper airway inflammation.  She has not really noticed much change. Her cough is about the same. She believes that her cough is an issue stuck in her throat. The use of ranitidine and Dexilant has not resulted in good control of this issue. Her nose is doing very well on Flonase and montelukast and she has no complaints revolving around her upper airways at this point in time.  Allergies as of 12/25/2016      Reactions   Amoxicillin-pot Clavulanate Diarrhea   Has patient had a PCN reaction causing immediate rash, facial/tongue/throat swelling, SOB or lightheadedness with hypotension:No Has patient had a PCN reaction causing severe rash involving mucus membranes or skin necrosis:No Has patient had a PCN reaction that required hospitalization:No Has patient had a PCN reaction occurring within the last 10 years:Yes If all of the above answers are "NO", then may proceed with Cephalosporin use   Avelox [moxifloxacin Hcl In Nacl] Nausea Only   Bactrim [sulfamethoxazole-trimethoprim] Other (See Comments)   flushing   Benzonatate Other (See Comments)   felt bad, out of sorts, disoriented.   Ciprofloxacin Rash   Rash across abdomen      Medication List      acetaminophen 500 MG tablet Commonly known as:  TYLENOL Take 500 mg by mouth every 6 (six) hours as needed for moderate pain or headache.   CALCIUM-VITAMIN D PO Take 1 tablet by mouth daily.   CENTRUM SILVER PO Take 1 tablet by mouth  daily.   CULTURELLE PO Take 1 tablet by mouth daily.   dexlansoprazole 60 MG capsule Commonly known as:  DEXILANT Take 1 capsule (60 mg total) by mouth daily.   ELIQUIS 5 MG Tabs tablet Generic drug:  apixaban TAKE 1 TABLET TWICE DAILY.   Estradiol 10 MCG Tabs vaginal tablet Place 1 tablet vaginally 2 (two) times a week.   fluticasone 50 MCG/ACT nasal spray Commonly known as:  FLONASE Place 2 sprays into both nostrils at bedtime as needed for allergies or rhinitis.   loratadine 10 MG tablet Commonly known as:  CLARITIN Take 10 mg by mouth daily as needed.   metoprolol tartrate 25 MG tablet Commonly known as:  LOPRESSOR TAKE 1/2 TABLET DAILY WHEN NEEDED.   montelukast 10 MG tablet Commonly known as:  SINGULAIR Take 1 tablet (10 mg total) by mouth at bedtime.   ranitidine 300 MG tablet Commonly known as:  ZANTAC Take 300 mg by mouth 2 (two) times daily.   tobramycin-dexamethasone ophthalmic solution Commonly known as:  TOBRADEX Place 3 drops into the right ear 3 (three) times daily as needed (ear irritation).   Vitamin D 1000 units capsule Take 1,000 Units by mouth daily.       Past Medical History:  Diagnosis Date  . Adenomatous colon polyp 10/2003  . Allergic rhinitis   . Allergy    SEASONAL  . Bronchiectasis   . Chronic sinusitis   . Diverticulosis   . GERD (gastroesophageal reflux disease)   . Hearing loss   . Hemorrhoids   .  Insomnia   . Menopausal disorder   . Osteoporosis   . Palpitations   . TMJ syndrome   . Urticaria     Past Surgical History:  Procedure Laterality Date  . COLONOSCOPY    . NASAL SINUS SURGERY    . TYMPANOSTOMY    . VIDEO BRONCHOSCOPY Bilateral 07/16/2016   Procedure: VIDEO BRONCHOSCOPY WITH FLUORO;  Surgeon: Collene Gobble, MD;  Location: [facility];  Service: Cardiopulmonary;  Laterality: Bilateral;    Review of systems negative except as noted in HPI / PMHx or noted below:  Review of Systems  Constitutional:  Negative.   HENT: Negative.   Eyes: Negative.   Respiratory: Negative.   Cardiovascular: Negative.   Gastrointestinal: Negative.   Genitourinary: Negative.   Musculoskeletal: Negative.   Skin: Negative.   Neurological: Negative.   Endo/Heme/Allergies: Negative.   Psychiatric/Behavioral: Negative.      Objective:   Vitals:   12/25/16 1038  BP: 108/64  Pulse: 80  Resp: 18          Physical Exam  Constitutional: She is well-developed, well-nourished, and in no distress.  HENT:  Head: Normocephalic.  Right Ear: Tympanic membrane and external ear normal. A foreign body (tube) is present.  Left Ear: Tympanic membrane, external ear and ear canal normal.  Nose: Nose normal. No mucosal edema or rhinorrhea.  Mouth/Throat: Uvula is midline, oropharynx is clear and moist and mucous membranes are normal. No oropharyngeal exudate.  Hearing aids  Eyes: Conjunctivae are normal.  Neck: Trachea normal. No tracheal tenderness present. No tracheal deviation present. No thyromegaly present.  Cardiovascular: Normal rate, regular rhythm, S1 normal, S2 normal and normal heart sounds.   No murmur heard. Pulmonary/Chest: Breath sounds normal. No stridor. No respiratory distress. She has no wheezes. She has no rales.  Musculoskeletal: She exhibits no edema.  Lymphadenopathy:       Head (right side): No tonsillar adenopathy present.       Head (left side): No tonsillar adenopathy present.    She has no cervical adenopathy.  Neurological: She is alert. Gait normal.  Skin: No rash noted. She is not diaphoretic. No erythema. Nails show no clubbing.  Psychiatric: Mood and affect normal.    Diagnostics: none  Assessment and Plan:   1. Bronchiectasis without complication ([place])   2. Other allergic rhinitis   3. LPRD (laryngopharyngeal reflux disease)     1. Continue to Treat inflammation:   A. Flonase one-2 sprays each nostril one time per day  B. montelukast 10 mg tablet 1 time per  day  3. Treat reflux:   A. ranitidine 300 mg in evening  B. discontinued Dexilant  C. elevate head of bed  D. no late meals  E. no caffeine or chocolate or mint consumption  4. If needed:   A. nasal saline  B. OTC antihistamine  5. Revisit with Dr. Benjamine Mola to examine throat  6. Follow-up with Dr. Lamonte Sakai  7. Obtain fall flu vaccine  8. Return to clinic in 1 year or earlier if problem  I think that All[place] has had an adequate trial concerning empiric therapy for her LPR and I'm going to have her discontinue her proton pump inhibitor as she believes that she has good control of this issue while using only ranitidine. I would like for her to revisit with Dr. Benjamine Mola to have a thorough evaluation of her throat. Although there was no significant abnormality identified with her larynx during a bronchoscopy performed by Dr. Lamonte Sakai I  think it would be worthwhile to have rhinoscopy performed for a more detailed examination of this organ. She has already had a CT scan of her upper airway performed in 2016 which identified no significant sinusitis. She will continue to use anti-inflammatory medications for her upper airways as this appears to be working quite well regarding this issue. I will see her back in this clinic in 1 year or earlier if there is a problem.  Allena Katz, MD Allergy / [facility]

## 2016-12-26 ENCOUNTER — Telehealth: Payer: Self-pay

## 2016-12-26 DIAGNOSIS — L219 Seborrheic dermatitis, unspecified: Secondary | ICD-10-CM | POA: Diagnosis not present

## 2016-12-26 DIAGNOSIS — L309 Dermatitis, unspecified: Secondary | ICD-10-CM | POA: Diagnosis not present

## 2016-12-26 DIAGNOSIS — L723 Sebaceous cyst: Secondary | ICD-10-CM | POA: Diagnosis not present

## 2016-12-26 NOTE — Telephone Encounter (Signed)
Referral faxed to Dr. Benjamine Mola ENT. Diagnoses K21.9 LPRD.

## 2016-12-27 ENCOUNTER — Encounter: Payer: Self-pay | Admitting: Internal Medicine

## 2016-12-27 ENCOUNTER — Ambulatory Visit (INDEPENDENT_AMBULATORY_CARE_PROVIDER_SITE_OTHER): Payer: Medicare Other | Admitting: Internal Medicine

## 2016-12-27 VITALS — BP 120/82 | HR 95 | Temp 97.8°F | Resp 16 | Wt 102.0 lb

## 2016-12-27 DIAGNOSIS — R51 Headache: Secondary | ICD-10-CM

## 2016-12-27 DIAGNOSIS — R519 Headache, unspecified: Secondary | ICD-10-CM | POA: Insufficient documentation

## 2016-12-27 MED ORDER — METHYLPREDNISOLONE ACETATE 80 MG/ML IJ SUSP
80.0000 mg | Freq: Once | INTRAMUSCULAR | Status: AC
  Administered 2016-12-27: 80 mg via INTRAMUSCULAR

## 2016-12-27 NOTE — Patient Instructions (Addendum)
You received a steroid injection today for your sinus headache.  Continue your over-the-counter sinus / allergy medications.  Call if your headache does not improve or if you have any questions/concerns.     Sinus Headache A sinus headache occurs when the paranasal sinuses become clogged or swollen. Paranasal sinuses are air pockets within the bones of the face. Sinus headaches can range from mild to severe. What are the causes? A sinus headache can result from various conditions that affect the sinuses, such as:  Colds.  Sinus infections.  Allergies.  What are the signs or symptoms? The main symptom of this condition is a headache that may feel like pain or pressure in the face, forehead, ears, or upper teeth. People who have a sinus headache often have other symptoms, such as:  Congested or runny nose.  Fever.  Inability to smell.  Weather changes can make symptoms worse. How is this diagnosed? This condition may be diagnosed based on:  A physical exam and medical history.  Imaging tests, such as a CT scan and MRI, to check for problems with the sinuses.  A specialist may look into the sinuses with a tool that has a camera (endoscopy).  How is this treated? Treatment for this condition depends on the cause.  Sinus pain that is caused by a sinus infection may be treated with antibiotic medicine.  Sinus pain that is caused by allergies may be helped by allergy medicines (antihistamines) and medicated nasal sprays.  Sinus pain that is caused by congestion may be helped by flushing the nose and sinuses with saline solution.  Follow these instructions at home:  Take medicines only as directed by your health care provider.  If you were prescribed an antibiotic medicine, finish all of it even if you start to feel better.  If you have congestion, use a nasal spray to help reduce pressure.  If directed, apply a warm, moist washcloth to your face to help relieve  pain. Contact a health care provider if:  You have headaches more than one time each week.  You have sensitivity to light or sound.  You have a fever.  You feel sick to your stomach (nauseous) or you throw up (vomit).  Your headaches do not get better with treatment. Many people think that they have a sinus headache when they actually have migraines or tension headaches. Get help right away if:  You have vision problems.  You have sudden, severe pain in your face or head.  You have a seizure.  You are confused.  You have a stiff neck. This information is not intended to replace advice given to you by your health care provider. Make sure you discuss any questions you have with your health care provider. Document Released: 05/30/2004 Document Revised: 12/17/2015 Document Reviewed: 04/18/2014 Elsevier Interactive Patient Education  [employer].

## 2016-12-27 NOTE — Assessment & Plan Note (Signed)
Intractable - persistent for almost one week and not responding to several otc medications Unable to take tylenol No evidence of an infection discussed treatment option - we both feel a steroid injection would be the most effective Depo-Medrol 80 mg IM x 1 Continue otc medications Call if no improvement

## 2016-12-27 NOTE — Progress Notes (Signed)
Subjective:    Patient ID: Monica [name], female    DOB: 12/17/1945, 71 y.o.   MRN: [id number]  HPI She is here for an acute visit for a sinus headache.   Her symptoms started about 6 days ago.  She has chronic sinus issues.    She is experiencing frontal headache and pressure on the top of her head, mild intermittent lightheadedness, sinus pressure, photophobia, mild intermittent nausea.  She has a chronic cough that is a little better.  She denies fever, chills, sinus drainage or nasal congestion.  She does not think she has a sinus infection at this point - just a headache that is not responding to everything she has tried.    She has tried taking Flonase, tylenol, mucinex and she does a daily nasal wash.    Medications and allergies reviewed with patient and updated if appropriate.  Patient Active Problem List   Diagnosis Date Noted  . Cough 05/27/2016  . PNA (pneumonia) 05/13/2016  . Dizziness 01/28/2016  . Headache 09/29/2015  . PAF (paroxysmal atrial fibrillation) ([place]) 09/19/2015  . Allergic reaction 03/22/2015  . Mastoiditis of right side 03/13/2015  . Chest pain, atypical 04/13/2014  . Healthcare maintenance 02/16/2013  . Elevated CA-125 02/16/2013  . IBS (irritable bowel syndrome) 11/25/2010  . ANXIETY DISORDER, SITUATIONAL, MILD 10/20/2009  . ALLERGIC RHINITIS, SEASONAL 04/23/2007  . GERD 03/10/2007  . Insomnia disorder 01/26/2007  . HEARING LOSS, LEFT EAR 01/26/2007  . Sinusitis, chronic 01/26/2007  . Bronchiectasis without acute exacerbation ([place]) 01/26/2007  . TMJ SYNDROME 01/26/2007  . MENOPAUSAL DISORDER 01/26/2007  . OSTEOPOROSIS 01/26/2007  . PALPITATIONS 01/26/2007    Current Outpatient Prescriptions on File Prior to Visit  Medication Sig Dispense Refill  . acetaminophen (TYLENOL) 500 MG tablet Take 500 mg by mouth every 6 (six) hours as needed for moderate pain or headache.    Marland Kitchen CALCIUM-VITAMIN D PO Take 1 tablet by mouth daily.    .  Cholecalciferol (VITAMIN D) 1000 UNITS capsule Take 1,000 Units by mouth daily.      Marland Kitchen ELIQUIS 5 MG TABS tablet TAKE 1 TABLET TWICE DAILY. 60 tablet 0  . Estradiol 10 MCG TABS Place 1 tablet vaginally 2 (two) times a week.     . fluticasone (FLONASE) 50 MCG/ACT nasal spray Place 2 sprays into both nostrils at bedtime as needed for allergies or rhinitis.    . Lactobacillus Rhamnosus, GG, (CULTURELLE PO) Take 1 tablet by mouth daily.    Marland Kitchen loratadine (CLARITIN) 10 MG tablet Take 10 mg by mouth daily as needed.     . metoprolol tartrate (LOPRESSOR) 25 MG tablet TAKE 1/2 TABLET DAILY WHEN NEEDED. 30 tablet 5  . Multiple Vitamins-Minerals (CENTRUM SILVER PO) Take 1 tablet by mouth daily.     . ranitidine (ZANTAC) 300 MG tablet Take 300 mg by mouth 2 (two) times daily.     Marland Kitchen tobramycin-dexamethasone (TOBRADEX) ophthalmic solution Place 3 drops into the right ear 3 (three) times daily as needed (ear irritation).      No current facility-administered medications on file prior to visit.     Past Medical History:  Diagnosis Date  . Adenomatous colon polyp 10/2003  . Allergic rhinitis   . Allergy    SEASONAL  . Bronchiectasis   . Chronic sinusitis   . Diverticulosis   . GERD (gastroesophageal reflux disease)   . Hearing loss   . Hemorrhoids   . Insomnia   . Menopausal disorder   .  Osteoporosis   . Palpitations   . TMJ syndrome   . Urticaria     Past Surgical History:  Procedure Laterality Date  . COLONOSCOPY    . NASAL SINUS SURGERY    . TYMPANOSTOMY    . VIDEO BRONCHOSCOPY Bilateral 07/16/2016   Procedure: VIDEO BRONCHOSCOPY WITH FLUORO;  Surgeon: Collene Gobble, MD;  Location: [facility];  Service: Cardiopulmonary;  Laterality: Bilateral;    Social History   Social History  . Marital status: Married    Spouse name: [name]  . Number of children: 0  . Years of education: BSN   Occupational History  . retired Therapist, sports    Social History Main Topics  . Smoking status: Never Smoker  .  Smokeless tobacco: Never Used  . Alcohol use 1.2 oz/week    2 Glasses of wine per week  . Drug use: No  . Sexual activity: Not on file   Other Topics Concern  . Not on file   Social History Narrative   Lives with husband   Caffeine use: 1 cup tea per day    Family History  Problem Relation Age of Onset  . Rectal cancer Mother        died age 51  . Seizures Mother   . Colon cancer Mother 42  . Cancer Mother   . Heart disease Father        dies age 40  . Hypertension Father   . Heart disease Maternal Grandfather   . AVM Brother   . Cancer Maternal Grandmother   . Cancer Paternal Uncle        multiple uncles with cancer    Review of Systems  Constitutional: Negative for chills and fever.  HENT: Positive for sinus pain (frontal). Negative for congestion, ear pain, postnasal drip, sinus pressure and sore throat.   Eyes: Positive for photophobia. Negative for visual disturbance.  Respiratory: Positive for cough (chronic). Negative for shortness of breath and wheezing.   Gastrointestinal: Positive for nausea (mild, intermittent).  Neurological: Positive for light-headedness and headaches.       Objective:   Vitals:   12/27/16 1051  BP: 120/82  Pulse: 95  Resp: 16  Temp: 97.8 F (36.6 C)  SpO2: 98%   Filed Weights   12/27/16 1051  Weight: 102 lb (46.3 kg)   Body mass index is 18.66 kg/m.  Wt Readings from Last 3 Encounters:  12/27/16 102 lb (46.3 kg)  11/14/16 101 lb (45.8 kg)  10/31/16 102 lb (46.3 kg)     Physical Exam GENERAL APPEARANCE: Appears stated age, well appearing, NAD EYES: conjunctiva clear, no icterus HEENT: bilateral tympanic membranes and ear canals normal, oropharynx with no erythema, no nasal congestion/erythema, sinus pressure with palpation, no thyromegaly, trachea midline, no cervical or supraclavicular lymphadenopathy LUNGS: Clear to auscultation without wheeze or crackles, unlabored breathing, good air entry bilaterally HEART:  Normal S1,S2 without murmurs NEURO: CNII-XII intact, non-focal EXTREMITIES: Without clubbing, cyanosis, or edema        Assessment & Plan:   See Problem List for Assessment and Plan of chronic medical problems.

## 2017-01-28 ENCOUNTER — Ambulatory Visit (INDEPENDENT_AMBULATORY_CARE_PROVIDER_SITE_OTHER): Payer: Medicare Other

## 2017-01-28 DIAGNOSIS — Z23 Encounter for immunization: Secondary | ICD-10-CM

## 2017-01-31 ENCOUNTER — Ambulatory Visit: Payer: Medicare Other | Admitting: Internal Medicine

## 2017-02-11 DIAGNOSIS — H903 Sensorineural hearing loss, bilateral: Secondary | ICD-10-CM | POA: Diagnosis not present

## 2017-02-11 DIAGNOSIS — J309 Allergic rhinitis, unspecified: Secondary | ICD-10-CM | POA: Diagnosis not present

## 2017-02-11 DIAGNOSIS — H6121 Impacted cerumen, right ear: Secondary | ICD-10-CM | POA: Diagnosis not present

## 2017-02-11 DIAGNOSIS — H6981 Other specified disorders of Eustachian tube, right ear: Secondary | ICD-10-CM | POA: Diagnosis not present

## 2017-02-19 ENCOUNTER — Telehealth: Payer: Self-pay | Admitting: Emergency Medicine

## 2017-02-19 NOTE — Telephone Encounter (Signed)
I was working recall list & called pt to schedule appt with RB for November.  Monica [name] has called pt back & scheduled her an appt.  Nothing further needed.

## 2017-03-11 ENCOUNTER — Encounter: Payer: Self-pay | Admitting: Adult Health

## 2017-03-11 ENCOUNTER — Ambulatory Visit (INDEPENDENT_AMBULATORY_CARE_PROVIDER_SITE_OTHER): Payer: Medicare Other | Admitting: Adult Health

## 2017-03-11 DIAGNOSIS — J479 Bronchiectasis, uncomplicated: Secondary | ICD-10-CM

## 2017-03-11 DIAGNOSIS — J328 Other chronic sinusitis: Secondary | ICD-10-CM

## 2017-03-11 MED ORDER — AZITHROMYCIN 250 MG PO TABS: ORAL_TABLET | ORAL | 0 refills | Status: AC

## 2017-03-11 MED ORDER — LEVALBUTEROL HCL 0.63 MG/3ML IN NEBU
0.6300 mg | INHALATION_SOLUTION | Freq: Once | RESPIRATORY_TRACT | Status: AC
  Administered 2017-03-11: 0.63 mg via RESPIRATORY_TRACT

## 2017-03-11 NOTE — Progress Notes (Signed)
@Patient  ID: Monica [name], female    DOB: 02/26/1946, 71 y.o.   MRN: [id number]  Chief Complaint  Patient presents with  . Acute Visit    Bronchitis     Referring provider: Cassandria Anger, MD  HPI: 71 yo female never smoker followed for Bronchiectasis , AR, GERD and urticaria  Hx of A Fib on Eliquis    TEST  bronchoscopy in 2003 - cx's negative 07/2016 bronchoscopy that negative except for pansensitive pseudomonas. Methacholine challenge on 09/13/16.  Neg  spirometry was normal   03/11/2017 Acute OV : Bronchitis  Pt presents for an acute office . Complains of 3 days of cough, congestion-white  low grade fever, nausea, tightness. Taking mucinex , tylenol and delsym without much help.  Exposed from sick friend with URI.  She is eating but has some nausea. No v/d.  No recent abx or travel.    Allergies  Allergen Reactions  . Amoxicillin-Pot Clavulanate Diarrhea    Has patient had a PCN reaction causing immediate rash, facial/tongue/throat swelling, SOB or lightheadedness with hypotension:No Has patient had a PCN reaction causing severe rash involving mucus membranes or skin necrosis:No Has patient had a PCN reaction that required hospitalization:No Has patient had a PCN reaction occurring within the last 10 years:Yes If all of the above answers are "NO", then may proceed with Cephalosporin use  . Avelox [Moxifloxacin Hcl In Nacl] Nausea Only  . Bactrim [Sulfamethoxazole-Trimethoprim] Other (See Comments)    flushing  . Benzonatate Other (See Comments)    felt bad, out of sorts, disoriented.  . Ciprofloxacin Rash    Rash across abdomen    Immunization History  Administered Date(s) Administered  . Influenza Split 02/25/2011, 02/05/2012  . Influenza Whole 03/02/2008, 03/02/2009, 02/06/2010  . Influenza, High Dose Seasonal PF 01/28/2017  . Influenza,inj,Quad PF,6+ Mos 02/04/2013, 01/24/2014, 02/02/2015, 02/01/2016  . Pneumococcal Conjugate-13 02/16/2013  .  Pneumococcal Polysaccharide-23 03/11/2003, 01/16/2009, 09/26/2014  . Td 06/06/2001  . Tdap 07/17/2012  . Zoster 05/07/2007    Past Medical History:  Diagnosis Date  . Adenomatous colon polyp 10/2003  . Allergic rhinitis   . Allergy    SEASONAL  . Bronchiectasis   . Chronic sinusitis   . Diverticulosis   . GERD (gastroesophageal reflux disease)   . Hearing loss   . Hemorrhoids   . Insomnia   . Menopausal disorder   . Osteoporosis   . Palpitations   . TMJ syndrome   . Urticaria     Tobacco History: Social History   Tobacco Use  Smoking Status Never Smoker  Smokeless Tobacco Never Used   Counseling given: Not Answered   Outpatient Encounter Medications as of 03/11/2017  Medication Sig  . acetaminophen (TYLENOL) 500 MG tablet Take 500 mg by mouth every 6 (six) hours as needed for moderate pain or headache.  Marland Kitchen CALCIUM-VITAMIN D PO Take 1 tablet by mouth daily.  . Cholecalciferol (VITAMIN D) 1000 UNITS capsule Take 1,000 Units by mouth daily.    Marland Kitchen ELIQUIS 5 MG TABS tablet TAKE 1 TABLET TWICE DAILY.  Marland Kitchen Estradiol 10 MCG TABS Place 1 tablet vaginally 2 (two) times a week.   . fluticasone (FLONASE) 50 MCG/ACT nasal spray Place 2 sprays into both nostrils at bedtime as needed for allergies or rhinitis.  . Lactobacillus Rhamnosus, GG, (CULTURELLE PO) Take 1 tablet by mouth daily.  Marland Kitchen loratadine (CLARITIN) 10 MG tablet Take 10 mg by mouth daily as needed.   . metoprolol tartrate (LOPRESSOR) 25 MG  tablet TAKE 1/2 TABLET DAILY WHEN NEEDED.  . Multiple Vitamins-Minerals (CENTRUM SILVER PO) Take 1 tablet by mouth daily.   . ranitidine (ZANTAC) 300 MG tablet Take 300 mg by mouth 2 (two) times daily.   Marland Kitchen tobramycin-dexamethasone (TOBRADEX) ophthalmic solution Place 3 drops into the right ear 3 (three) times daily as needed (ear irritation).   Marland Kitchen azithromycin (ZITHROMAX Z-PAK) 250 MG tablet Take 2 tablets (500 mg) on  Day 1,  followed by 1 tablet (250 mg) once daily on Days 2 through 5.    No facility-administered encounter medications on file as of 03/11/2017.      Review of Systems  Constitutional:   No  weight loss, night sweats,   +Fevers, chills, fatigue, or  lassitude.  HEENT:   No headaches,  Difficulty swallowing,  Tooth/dental problems, or  Sore throat,                No sneezing, itching, ear ache,  +nasal congestion, post nasal drip,   CV:  No chest pain,  Orthopnea, PND, swelling in lower extremities, anasarca, dizziness, palpitations, syncope.   GI  No heartburn, indigestion, abdominal pain, nausea, vomiting, diarrhea, change in bowel habits, loss of appetite, bloody stools.   Resp:    No chest wall deformity  Skin: no rash or lesions.  GU: no dysuria, change in color of urine, no urgency or frequency.  No flank pain, no hematuria   MS:  No joint pain or swelling.  No decreased range of motion.  No back pain.    Physical Exam  BP (!) 96/56 (BP Location: Left Arm, Cuff Size: Normal)   Temp 97.6 F (36.4 C) (Oral)   Ht 5\' 2"  (1.575 m)   Wt 100 lb (45.4 kg)   SpO2 100%   BMI 18.29 kg/m   GEN: A/Ox3; pleasant , NAD, thin and frail    HEENT:  [place]/AT,  EACs-clear, TMs-wnl, NOSE-clear, THROAT-clear, no lesions, no postnasal drip or exudate noted.   NECK:  Supple w/ fair ROM; no JVD; normal carotid impulses w/o bruits; no thyromegaly or nodules palpated; no lymphadenopathy.    RESP  Clear  P & A; w/o, wheezes/ rales/ or rhonchi. no accessory muscle use, no dullness to percussion  CARD:  RRR, no m/r/g, no peripheral edema, pulses intact, no cyanosis or clubbing.  GI:   Soft & nt; nml bowel sounds; no organomegaly or masses detected.   Musco: Warm bil, no deformities or joint swelling noted.   Neuro: alert, no focal deficits noted.    Skin: Warm, no lesions or rashes    Lab Results:  CBC   BMET  BNP No results found for: BNP  ProBNP No results found for: PROBNP  Imaging: No results found.   Assessment & Plan:    Bronchiectasis without acute exacerbation (HCC) URI/ vs early flare . Difficult to determine if this is viral vs early bacterial infection with underlying  bronchiectasis  Cont w/ pulmonary hygiene regimen  Add zpack if sx persist/discolored mucus   Plan  Patient Instructions  ZPack take as directed. -to have on hold if symptoms worsen with discolored mucus. Or not improving .  Continue on Flutter valve Two to three times a day  .  Mucinex Twice daily  .As needed  Congestion  Delsym As needed  Cough  Fluids and rest  Tylenol As needed   Please contact office for sooner follow up if symptoms do not improve or worsen or seek emergency care  follow up Dr. Lamonte Sakai  As planned this month and As needed         Sinusitis, chronic URI /vs early flare   Plan  Patient Instructions  ZPack take as directed. -to have on hold if symptoms worsen with discolored mucus. Or not improving .  Continue on Flutter valve Two to three times a day  .  Mucinex Twice daily  .As needed  Congestion  Delsym As needed  Cough  Fluids and rest  Tylenol As needed   Please contact office for sooner follow up if symptoms do not improve or worsen or seek emergency care  follow up Dr. Lamonte Sakai  As planned this month and As needed            Rexene Edison, NP 03/11/2017

## 2017-03-11 NOTE — Assessment & Plan Note (Signed)
URI/ vs early flare . Difficult to determine if this is viral vs early bacterial infection with underlying  bronchiectasis  Cont w/ pulmonary hygiene regimen  Add zpack if sx persist/discolored mucus   Plan  Patient Instructions  ZPack take as directed. -to have on hold if symptoms worsen with discolored mucus. Or not improving .  Continue on Flutter valve Two to three times a day  .  Mucinex Twice daily  .As needed  Congestion  Delsym As needed  Cough  Fluids and rest  Tylenol As needed   Please contact office for sooner follow up if symptoms do not improve or worsen or seek emergency care  follow up Dr. Lamonte Sakai  As planned this month and As needed

## 2017-03-11 NOTE — Addendum Note (Signed)
Addended by: Parke Poisson E on: 03/11/2017 12:03 PM   Modules accepted: Orders

## 2017-03-11 NOTE — Assessment & Plan Note (Signed)
URI /vs early flare   Plan  Patient Instructions  ZPack take as directed. -to have on hold if symptoms worsen with discolored mucus. Or not improving .  Continue on Flutter valve Two to three times a day  .  Mucinex Twice daily  .As needed  Congestion  Delsym As needed  Cough  Fluids and rest  Tylenol As needed   Please contact office for sooner follow up if symptoms do not improve or worsen or seek emergency care  follow up Dr. Lamonte Sakai  As planned this month and As needed

## 2017-03-11 NOTE — Patient Instructions (Signed)
ZPack take as directed. -to have on hold if symptoms worsen with discolored mucus. Or not improving .  Continue on Flutter valve Two to three times a day  .  Mucinex Twice daily  .As needed  Congestion  Delsym As needed  Cough  Fluids and rest  Tylenol As needed   Please contact office for sooner follow up if symptoms do not improve or worsen or seek emergency care  follow up Dr. Lamonte Sakai  As planned this month and As needed

## 2017-03-12 ENCOUNTER — Telehealth: Payer: Self-pay | Admitting: Adult Health

## 2017-03-12 NOTE — Telephone Encounter (Signed)
Spoke with the pt  She was seen on 03/11/17 and given the following instructions:    ZPack take as directed. -to have on hold if symptoms worsen with discolored mucus. Or not improving .  Continue on Flutter valve Two to three times a day  .  Mucinex Twice daily  .As needed  Congestion  Delsym As needed  Cough  Fluids and rest  Tylenol As needed   Please contact office for sooner follow up if symptoms do not improve or worsen or seek emergency care  follow up Dr. Lamonte Sakai  As planned this month and As needed    She states she started having chills last night and low grade temp  She is not producing any sputum yet  Since AVS says take zpack if no better, I advised she should go ahead and take the zpack and continue other ov recs and call back if not improving or symptoms worse ED if needed  She verbalized understanding and states nothing further needed

## 2017-03-14 ENCOUNTER — Encounter (HOSPITAL_COMMUNITY): Payer: Self-pay | Admitting: Emergency Medicine

## 2017-03-14 ENCOUNTER — Emergency Department (HOSPITAL_COMMUNITY): Payer: Medicare Other

## 2017-03-14 ENCOUNTER — Other Ambulatory Visit: Payer: Self-pay

## 2017-03-14 ENCOUNTER — Emergency Department (HOSPITAL_COMMUNITY)
Admission: EM | Admit: 2017-03-14 | Discharge: 2017-03-14 | Disposition: A | Discharge status: Home / Self Care | Payer: Medicare Other | Attending: Emergency Medicine | Admitting: Emergency Medicine

## 2017-03-14 DIAGNOSIS — J181 Lobar pneumonia, unspecified organism: Secondary | ICD-10-CM | POA: Insufficient documentation

## 2017-03-14 DIAGNOSIS — J479 Bronchiectasis, uncomplicated: Secondary | ICD-10-CM | POA: Diagnosis not present

## 2017-03-14 DIAGNOSIS — K2900 Acute gastritis without bleeding: Secondary | ICD-10-CM | POA: Diagnosis not present

## 2017-03-14 DIAGNOSIS — J470 Bronchiectasis with acute lower respiratory infection: Secondary | ICD-10-CM

## 2017-03-14 DIAGNOSIS — R05 Cough: Secondary | ICD-10-CM | POA: Diagnosis not present

## 2017-03-14 DIAGNOSIS — R030 Elevated blood-pressure reading, without diagnosis of hypertension: Secondary | ICD-10-CM | POA: Diagnosis not present

## 2017-03-14 DIAGNOSIS — J189 Pneumonia, unspecified organism: Secondary | ICD-10-CM | POA: Diagnosis not present

## 2017-03-14 DIAGNOSIS — R509 Fever, unspecified: Secondary | ICD-10-CM | POA: Diagnosis present

## 2017-03-14 DIAGNOSIS — G4489 Other headache syndrome: Secondary | ICD-10-CM | POA: Diagnosis not present

## 2017-03-14 DIAGNOSIS — Z7901 Long term (current) use of anticoagulants: Secondary | ICD-10-CM | POA: Insufficient documentation

## 2017-03-14 LAB — CBC WITH DIFFERENTIAL/PLATELET
BASOS PCT: 0 %
Basophils Absolute: 0 10*3/uL (ref 0.0–0.1)
EOS ABS: 0.1 10*3/uL (ref 0.0–0.7)
EOS PCT: 0 %
HCT: 35.1 % — ABNORMAL LOW (ref 36.0–46.0)
HEMOGLOBIN: 12 g/dL (ref 12.0–15.0)
Lymphocytes Relative: 5 %
Lymphs Abs: 0.9 10*3/uL (ref 0.7–4.0)
MCH: 30.8 pg (ref 26.0–34.0)
MCHC: 34.2 g/dL (ref 30.0–36.0)
MCV: 90 fL (ref 78.0–100.0)
MONOS PCT: 12 %
Monocytes Absolute: 2.1 10*3/uL — ABNORMAL HIGH (ref 0.1–1.0)
NEUTROS PCT: 83 %
Neutro Abs: 13.9 10*3/uL — ABNORMAL HIGH (ref 1.7–7.7)
PLATELETS: 339 10*3/uL (ref 150–400)
RBC: 3.9 MIL/uL (ref 3.87–5.11)
RDW: 13 % (ref 11.5–15.5)
WBC: 16.9 10*3/uL — ABNORMAL HIGH (ref 4.0–10.5)

## 2017-03-14 LAB — BASIC METABOLIC PANEL
Anion gap: 9 (ref 5–15)
BUN: 10 mg/dL (ref 6–20)
CALCIUM: 8.8 mg/dL — AB (ref 8.9–10.3)
CO2: 26 mmol/L (ref 22–32)
CREATININE: 0.7 mg/dL (ref 0.44–1.00)
Chloride: 102 mmol/L (ref 101–111)
GFR calc non Af Amer: >60 mL/min (ref >60)
Glucose, Bld: 131 mg/dL — ABNORMAL HIGH (ref 65–99)
Potassium: 3.6 mmol/L (ref 3.5–5.1)
Sodium: 137 mmol/L (ref 135–145)

## 2017-03-14 MED ORDER — DEXTROSE 5 % IV SOLN
1.0000 g | Freq: Once | INTRAVENOUS | Status: AC
  Administered 2017-03-14: 1 g via INTRAVENOUS
  Filled 2017-03-14: qty 10

## 2017-03-14 MED ORDER — GI COCKTAIL ~~LOC~~
30.0000 mL | Freq: Once | ORAL | Status: AC
  Administered 2017-03-14: 30 mL via ORAL
  Filled 2017-03-14: qty 30

## 2017-03-14 MED ORDER — SODIUM CHLORIDE 0.9 % IV BOLUS (SEPSIS)
1000.0000 mL | Freq: Once | INTRAVENOUS | Status: AC
  Administered 2017-03-14: 1000 mL via INTRAVENOUS

## 2017-03-14 MED ORDER — AMOXICILLIN 500 MG PO CAPS: 1000.0000 mg | ORAL_CAPSULE | Freq: Two times a day (BID) | ORAL | 0 refills | Status: DC

## 2017-03-14 MED ORDER — IPRATROPIUM-ALBUTEROL 0.5-2.5 (3) MG/3ML IN SOLN
3.0000 mL | Freq: Once | RESPIRATORY_TRACT | Status: AC
  Administered 2017-03-14: 3 mL via RESPIRATORY_TRACT
  Filled 2017-03-14: qty 3

## 2017-03-14 MED ORDER — ONDANSETRON HCL 4 MG/2ML IJ SOLN
4.0000 mg | Freq: Once | INTRAMUSCULAR | Status: AC
  Administered 2017-03-14: 4 mg via INTRAVENOUS
  Filled 2017-03-14: qty 2

## 2017-03-14 MED ORDER — ONDANSETRON HCL 4 MG PO TABS: 4.0000 mg | ORAL_TABLET | Freq: Four times a day (QID) | ORAL | 0 refills | Status: DC | PRN

## 2017-03-14 MED ORDER — ALBUTEROL SULFATE HFA 108 (90 BASE) MCG/ACT IN AERS: 2.0000 | INHALATION_SPRAY | RESPIRATORY_TRACT | 0 refills | Status: DC | PRN

## 2017-03-14 NOTE — ED Notes (Signed)
Patient temperature taken twice due to patient not believing it was right.

## 2017-03-14 NOTE — ED Provider Notes (Signed)
[facility] DEPT Provider Note   CSN: [id number] Arrival date & time: 03/14/17  6734     History   Chief Complaint Chief Complaint  Patient presents with  . Headache  . Cough  . Fever    HPI Monica [name] is a 71 y.o. female.  The history is provided by the patient.  She has a history of bronchiectasis as well as GERD.  She complains of fever, cough, shortness of breath for the last 5 days.  Temperatures have been as high as 102.  She denies chills or sweats.  She did see her pulmonologist 2 days ago and was started on azithromycin.  She is complaining of upset stomach and nausea since starting on azithromycin.  Cough and fever have been worse over the last 2 days.  She is started to develop a headache.  Of note, she did get the influenza immunization this year.  She denies any sick contacts.  Past Medical History:  Diagnosis Date  . Adenomatous colon polyp 10/2003  . Allergic rhinitis   . Allergy    SEASONAL  . Bronchiectasis   . Chronic sinusitis   . Diverticulosis   . GERD (gastroesophageal reflux disease)   . Hearing loss   . Hemorrhoids   . Insomnia   . Menopausal disorder   . Osteoporosis   . Palpitations   . TMJ syndrome   . Urticaria     Patient Active Problem List   Diagnosis Date Noted  . Acute intractable headache 12/27/2016  . Cough 05/27/2016  . PNA (pneumonia) 05/13/2016  . Dizziness 01/28/2016  . Headache 09/29/2015  . PAF (paroxysmal atrial fibrillation) ([place]) 09/19/2015  . Allergic reaction 03/22/2015  . Mastoiditis of right side 03/13/2015  . Chest pain, atypical 04/13/2014  . Healthcare maintenance 02/16/2013  . Elevated CA-125 02/16/2013  . IBS (irritable bowel syndrome) 11/25/2010  . ANXIETY DISORDER, SITUATIONAL, MILD 10/20/2009  . ALLERGIC RHINITIS, SEASONAL 04/23/2007  . GERD 03/10/2007  . Insomnia disorder 01/26/2007  . HEARING LOSS, LEFT EAR 01/26/2007  . Sinusitis, chronic 01/26/2007  .  Bronchiectasis without acute exacerbation ([place]) 01/26/2007  . TMJ SYNDROME 01/26/2007  . MENOPAUSAL DISORDER 01/26/2007  . OSTEOPOROSIS 01/26/2007  . PALPITATIONS 01/26/2007    Past Surgical History:  Procedure Laterality Date  . COLONOSCOPY    . NASAL SINUS SURGERY    . TYMPANOSTOMY      OB History    No data available       Home Medications    Prior to Admission medications   Medication Sig Start Date End Date Taking? Authorizing Provider  acetaminophen (TYLENOL) 500 MG tablet Take 500 mg by mouth every 6 (six) hours as needed for moderate pain or headache.    [provider]  azithromycin (ZITHROMAX Z-PAK) 250 MG tablet Take 2 tablets (500 mg) on  Day 1,  followed by 1 tablet (250 mg) once daily on Days 2 through 5. 03/11/17 03/16/17  Parrett, Tammy S, NP  CALCIUM-VITAMIN D PO Take 1 tablet by mouth daily.    [provider]  Cholecalciferol (VITAMIN D) 1000 UNITS capsule Take 1,000 Units by mouth daily.      [provider]  ELIQUIS 5 MG TABS tablet TAKE 1 TABLET TWICE DAILY. 12/24/16   Croitoru, Mihai, MD  Estradiol 10 MCG TABS Place 1 tablet vaginally 2 (two) times a week.     [provider]  fluticasone (FLONASE) 50 MCG/ACT nasal spray Place 2 sprays into both  nostrils at bedtime as needed for allergies or rhinitis.    [provider]  Lactobacillus Rhamnosus, GG, (CULTURELLE PO) Take 1 tablet by mouth daily.    [provider]  loratadine (CLARITIN) 10 MG tablet Take 10 mg by mouth daily as needed.     [provider]  metoprolol tartrate (LOPRESSOR) 25 MG tablet TAKE 1/2 TABLET DAILY WHEN NEEDED. 12/23/16   Croitoru, Mihai, MD  Multiple Vitamins-Minerals (CENTRUM SILVER PO) Take 1 tablet by mouth daily.     [provider]  ranitidine (ZANTAC) 300 MG tablet Take 300 mg by mouth 2 (two) times daily.     [provider]  tobramycin-dexamethasone [facility]) ophthalmic solution Place 3 drops into  the right ear 3 (three) times daily as needed (ear irritation).     [provider]    Family History Family History  Problem Relation Age of Onset  . Rectal cancer Mother        died age 22  . Seizures Mother   . Colon cancer Mother 64  . Cancer Mother   . Heart disease Father        dies age 85  . Hypertension Father   . Heart disease Maternal Grandfather   . AVM Brother   . Cancer Maternal Grandmother   . Cancer Paternal Uncle        multiple uncles with cancer    Social History Social History   Tobacco Use  . Smoking status: Never Smoker  . Smokeless tobacco: Never Used  Substance Use Topics  . Alcohol use: Yes    Alcohol/week: 1.2 oz    Types: 2 Glasses of wine per week  . Drug use: No     Allergies   Amoxicillin-pot clavulanate; Avelox [moxifloxacin hcl in nacl]; Bactrim [sulfamethoxazole-trimethoprim]; Benzonatate; and Ciprofloxacin   Review of Systems Review of Systems  All other systems reviewed and are negative.    Physical Exam Updated Vital Signs BP 117/70   Pulse (!) 114   Temp 98.2 F (36.8 C) (Oral)   Resp 20   Ht 5\' 2"  (1.575 m)   Wt 45.4 kg (100 lb)   SpO2 99%   BMI 18.29 kg/m   Physical Exam  Vitals reviewed.  Thin and somewhat frail appearing 71 year old female, resting comfortably and in no acute distress. Vital signs are significant for tachycardia. Oxygen saturation is 99%, which is normal. Head is normocephalic and atraumatic. PERRLA, EOMI. Oropharynx is clear. Neck is nontender and supple without adenopathy or JVD. Back is nontender and there is no CVA tenderness. Lungs have coarse breath sounds with slightly prolonged exhalation phase.  There are no overt rales, wheezes, or rhonchi. Chest is nontender. Heart has regular rate and rhythm without murmur. Abdomen is soft, flat, nontender without masses or hepatosplenomegaly and peristalsis is normoactive. Extremities have no cyanosis or edema, full range of motion is  present. Skin is warm and dry without rash. Neurologic: Mental status is normal, cranial nerves are intact, there are no motor or sensory deficits.  ED Treatments / Results  Labs (all labs ordered are listed, but only abnormal results are displayed) Labs Reviewed  BASIC METABOLIC PANEL - Abnormal; Notable for the following components:      Result Value   Glucose, Bld 131 (*)    Calcium 8.8 (*)    All other components within normal limits  CBC WITH DIFFERENTIAL/PLATELET - Abnormal; Notable for the following components:   WBC 16.9 (*)  HCT 35.1 (*)    Neutro Abs 13.9 (*)    Monocytes Absolute 2.1 (*)    All other components within normal limits   Radiology Dg Chest 2 View  Result Date: 03/14/2017 CLINICAL DATA:  Cough and fever for 48 hours. EXAM: CHEST  2 VIEW COMPARISON:  07/16/2016 FINDINGS: New airspace disease in the left lower lung consistent with pneumonia. Scarring and bronchiectasis in the right lung base. Scarring in the lung apices. Heart size and pulmonary vascularity are normal. No pleural effusions. No pneumothorax. IMPRESSION: Airspace disease in the left lower lung consistent with pneumonia. Chronic scarring and bronchiectasis in the right lung base. Electronically Signed   By: Lucienne Capers M.D.   On: 03/14/2017 05:15    Procedures Procedures (including critical care time)  Medications Ordered in ED Medications  cefTRIAXone (ROCEPHIN) 1 g in dextrose 5 % 50 mL IVPB (not administered)  sodium chloride 0.9 % bolus 1,000 mL (0 mLs Intravenous Stopped 03/14/17 0558)  ondansetron (ZOFRAN) injection 4 mg (4 mg Intravenous Given 03/14/17 0442)  gi cocktail (Maalox,Lidocaine,Donnatal) (30 mLs Oral Given 03/14/17 0442)  ipratropium-albuterol (DUONEB) 0.5-2.5 (3) MG/3ML nebulizer solution 3 mL (3 mLs Nebulization Given 03/14/17 0442)     Initial Impression / Assessment and Plan / ED Course  I have reviewed the triage vital signs and the nursing notes.  Pertinent labs &  imaging results that were available during my care of the patient were reviewed by me and considered in my medical decision making (see chart for details).  Respiratory tract infection patient with history of bronchiectasis.  Will check chest x-ray and give therapeutic trial of albuterol with ipratropium.  She will also be given IV fluids and ondansetron.  Old records are reviewed, and she has been managed predominantly as an outpatient for her bronchiectasis.  She feels much better after above-noted treatment.  Chest x-ray does show left lower lobe infiltrate.  I discussed her antibiotic allergies.  She is allergic to Augmentin, but it is actually a side effect of diarrhea.  She can take penicillins.  She is discharged with prescription for amoxicillin as well as albuterol inhaler and ondansetron.  Follow-up with PCP for recheck in 3 days.  Strict return precautions discussed.  Final Clinical Impressions(s) / ED Diagnoses   Final diagnoses:  Community acquired pneumonia of left lower lobe of lung ([place])  Bronchiectasis with acute lower respiratory infection ([place])  Other acute gastritis without hemorrhage    ED Discharge Orders        Ordered    amoxicillin (AMOXIL) 500 MG capsule  2 times daily     03/14/17 0600    ondansetron (ZOFRAN) 4 MG tablet  Every 6 hours PRN     03/14/17 0600    albuterol (PROVENTIL HFA;VENTOLIN HFA) 108 (90 Base) MCG/ACT inhaler  Every 4 hours PRN     96/28/36 [id number]       Delora Fuel, MD 76/54/65 [phone]

## 2017-03-14 NOTE — Discharge Instructions (Signed)
Return if symptoms are not being adequately controlled at home. °

## 2017-03-14 NOTE — ED Triage Notes (Addendum)
Patient complaining of nausea, headache, fever, and non productive cough since Tuesday. No vomiting and diarrhea. Patient has taken phenergan and codeine tonight. Patient states this has gotten worse since Tuesday.

## 2017-03-17 ENCOUNTER — Encounter: Payer: Self-pay | Admitting: Adult Health

## 2017-03-17 ENCOUNTER — Ambulatory Visit (INDEPENDENT_AMBULATORY_CARE_PROVIDER_SITE_OTHER)
Admission: RE | Admit: 2017-03-17 | Discharge: 2017-03-17 | Disposition: A | Discharge status: Home / Self Care | Payer: Medicare Other | Source: Ambulatory Visit | Attending: Adult Health | Admitting: Adult Health

## 2017-03-17 ENCOUNTER — Ambulatory Visit (INDEPENDENT_AMBULATORY_CARE_PROVIDER_SITE_OTHER): Payer: Medicare Other | Admitting: Adult Health

## 2017-03-17 VITALS — BP 108/82 | Ht 62.0 in | Wt 101.6 lb

## 2017-03-17 DIAGNOSIS — J189 Pneumonia, unspecified organism: Secondary | ICD-10-CM

## 2017-03-17 DIAGNOSIS — R0602 Shortness of breath: Secondary | ICD-10-CM | POA: Diagnosis not present

## 2017-03-17 DIAGNOSIS — J479 Bronchiectasis, uncomplicated: Secondary | ICD-10-CM | POA: Diagnosis not present

## 2017-03-17 DIAGNOSIS — R05 Cough: Secondary | ICD-10-CM | POA: Diagnosis not present

## 2017-03-17 DIAGNOSIS — J181 Lobar pneumonia, unspecified organism: Secondary | ICD-10-CM

## 2017-03-17 NOTE — Assessment & Plan Note (Signed)
Flare with associated LLL PNA  Plan  Patient Instructions  Finish Amoxicillin as planned.  Continue on Flutter valve . Mucinex Twice daily  .As needed  Congestion  Delsym As needed  Cough  Push Fluids and rest  Tylenol As needed   Follow up in 1 week with chest xray .  Please contact office for sooner follow up if symptoms do not improve or worsen or seek emergency care  follow up Dr. Lamonte Sakai  As planned this month and As needed

## 2017-03-17 NOTE — Progress Notes (Signed)
@Patient  ID: Monica [name], female    DOB: Sep 14, 1945, 71 y.o.   MRN: [id number]  Chief Complaint  Patient presents with  . Follow-up    Bronchiectasis     Referring provider: Cassandria Anger, MD  HPI: 71 yo female never smoker followed for Bronchiectasis , AR, GERD and urticaria  Hx of A Fib on Eliquis    TEST  bronchoscopy in 2003 - cx's negative HRCT Chest 05/2016 >bronchiectasis increased mid to lower lobes, increased micronodularity /macronodularitiy/GG attenuation. Mucoid impaction . ?MAI  07/2016 bronchoscopy that negative except for pansensitive pseudomonas. AFB neg .  Methacholine challenge on 09/13/16.  Neg  spirometry was normal -10/2016    03/17/2017 Follow up : ER /CAP , Bronchiectasis  Pt returns for 1 week follow up . She was seen last visit for 3 days of increased cough and congestion with low grade fever. Placed on [place] . Symptoms continued with n/d  and was seen in ER on 11/9 , CXR showed a LLL PNA .  She was changed to Amoxicillin for 10 days . She is tolerating but still very weak. Is able to drink lots of fluids. Eats yogurt and small amt of food. Appetite remains low.  CXR today shows some partial clearing of LLL consolidation.  She denies chest pain, orthopnea, edema or hemoptysis .    Allergies  Allergen Reactions  . Amoxicillin-Pot Clavulanate Diarrhea    Has patient had a PCN reaction causing immediate rash, facial/tongue/throat swelling, SOB or lightheadedness with hypotension:No Has patient had a PCN reaction causing severe rash involving mucus membranes or skin necrosis:No Has patient had a PCN reaction that required hospitalization:No Has patient had a PCN reaction occurring within the last 10 years:Yes If all of the above answers are "NO", then may proceed with Cephalosporin use  . Avelox [Moxifloxacin Hcl In Nacl] Nausea Only  . Bactrim [Sulfamethoxazole-Trimethoprim] Other (See Comments)    flushing  . Benzonatate Other (See Comments)      felt bad, out of sorts, disoriented.  . Ciprofloxacin Rash    Rash across abdomen    Immunization History  Administered Date(s) Administered  . Influenza Split 02/25/2011, 02/05/2012  . Influenza Whole 03/02/2008, 03/02/2009, 02/06/2010  . Influenza, High Dose Seasonal PF 01/28/2017  . Influenza,inj,Quad PF,6+ Mos 02/04/2013, 01/24/2014, 02/02/2015, 02/01/2016  . Pneumococcal Conjugate-13 02/16/2013  . Pneumococcal Polysaccharide-23 03/11/2003, 01/16/2009, 09/26/2014  . Td 06/06/2001  . Tdap 07/17/2012  . Zoster 05/07/2007    Past Medical History:  Diagnosis Date  . Adenomatous colon polyp 10/2003  . Allergic rhinitis   . Allergy    SEASONAL  . Bronchiectasis   . Chronic sinusitis   . Diverticulosis   . GERD (gastroesophageal reflux disease)   . Hearing loss   . Hemorrhoids   . Insomnia   . Menopausal disorder   . Osteoporosis   . Palpitations   . TMJ syndrome   . Urticaria     Tobacco History: Social History   Tobacco Use  Smoking Status Never Smoker  Smokeless Tobacco Never Used   Counseling given: Not Answered   Outpatient Encounter Medications as of 03/17/2017  Medication Sig  . acetaminophen (TYLENOL) 500 MG tablet Take 500 mg by mouth every 6 (six) hours as needed for moderate pain or headache.  . albuterol (PROVENTIL HFA;VENTOLIN HFA) 108 (90 Base) MCG/ACT inhaler Inhale 2 puffs every 4 (four) hours as needed into the lungs for wheezing or shortness of breath (or coughing).  Marland Kitchen amoxicillin (AMOXIL) 500  MG capsule Take 2 capsules (1,000 mg total) 2 (two) times daily by mouth.  Marland Kitchen CALCIUM-VITAMIN D PO Take 1 tablet by mouth daily.  . Cholecalciferol (VITAMIN D) 1000 UNITS capsule Take 1,000 Units by mouth daily.    Marland Kitchen dextromethorphan (DELSYM) 30 MG/5ML liquid Take 15-30 mLs 2 (two) times daily as needed by mouth for cough.  Marland Kitchen ELIQUIS 5 MG TABS tablet TAKE 1 TABLET TWICE DAILY.  Marland Kitchen Estradiol 10 MCG TABS Place 10 mcg 2 (two) times a week vaginally. Pt does  not have set days  . fluticasone (FLONASE) 50 MCG/ACT nasal spray Place 2 sprays into both nostrils at bedtime as needed for allergies or rhinitis.  Marland Kitchen guaiFENesin (MUCINEX) 600 MG 12 hr tablet Take 600 mg 2 (two) times daily as needed by mouth for cough.  . Lactobacillus Rhamnosus, GG, (CULTURELLE PO) Take 1 tablet by mouth daily.  Marland Kitchen loratadine (CLARITIN) 10 MG tablet Take 10 mg daily as needed by mouth for allergies.   . metoprolol tartrate (LOPRESSOR) 25 MG tablet TAKE 1/2 TABLET DAILY WHEN NEEDED.  . Multiple Vitamins-Minerals (CENTRUM SILVER PO) Take 1 tablet by mouth daily.   Marland Kitchen omeprazole (PRILOSEC) 20 MG capsule Take 20 mg daily by mouth.  . ondansetron (ZOFRAN) 4 MG tablet Take 1 tablet (4 mg total) every 6 (six) hours as needed by mouth for nausea.  . promethazine-codeine (PHENERGAN WITH CODEINE) 6.25-10 MG/5ML syrup Take 5 mLs every 6 (six) hours as needed by mouth for cough.  . ranitidine (ZANTAC) 300 MG tablet Take 150 mg every evening by mouth.    No facility-administered encounter medications on file as of 03/17/2017.      Review of Systems  Constitutional:   No  weight loss, night sweats,  Fevers, chills,  +fatigue, or  lassitude.  HEENT:   No headaches,  Difficulty swallowing,  Tooth/dental problems, or  Sore throat,                No sneezing, itching, ear ache, nasal congestion, post nasal drip,   CV:  No chest pain,  Orthopnea, PND, swelling in lower extremities, anasarca, dizziness, palpitations, syncope.   GI  No heartburn, indigestion, abdominal pain, nausea, vomiting, diarrhea, change in bowel habits, loss of appetite, bloody stools.   Resp:  No chest wall deformity  Skin: no rash or lesions.  GU: no dysuria, change in color of urine, no urgency or frequency.  No flank pain, no hematuria   MS:  No joint pain or swelling.  No decreased range of motion.  No back pain.    Physical Exam  BP 108/82 (BP Location: Left Arm, Cuff Size: Normal)   Ht 5\' 2"  (1.575  m)   Wt 101 lb 9.6 oz (46.1 kg)   SpO2 98%   BMI 18.58 kg/m   GEN: A/Ox3; pleasant , NAD, thin and frail    HEENT:  [place]/AT,  EACs-clear, TMs-wnl, NOSE-clear, THROAT-clear, no lesions, no postnasal drip or exudate noted.   NECK:  Supple w/ fair ROM; no JVD; normal carotid impulses w/o bruits; no thyromegaly or nodules palpated; no lymphadenopathy.    RESP  LLL crackles , . no accessory muscle use, no dullness to percussion  CARD:  RRR, no m/r/g, no peripheral edema, pulses intact, no cyanosis or clubbing.  GI:   Soft & nt; nml bowel sounds; no organomegaly or masses detected.   Musco: Warm bil, no deformities or joint swelling noted.   Neuro: alert, no focal deficits noted.  Skin: Warm, no lesions or rashes    Lab Results:  CBC   BNP No results found for: BNP  ProBNP No results found for: PROBNP  Imaging: Dg Chest 2 View  Result Date: 03/17/2017 CLINICAL DATA:  Shortness of breath with cough and congestion. Fever. EXAM: CHEST  2 VIEW COMPARISON:  March 14, 2017 and July 16, 2016 FINDINGS: There has been partial clearing of airspace consolidation from the left lower lobe. Patchy infiltrate does remain in the left lower lobe. There is stable scarring in the lateral right base. There is also scarring in both upper lobes. No new opacity evident. Heart size and pulmonary vascularity are normal. No adenopathy. No bone lesions. IMPRESSION: Partial but incomplete clearing of infiltrate from the left base, consistent with partial resolution of pneumonia. Areas of scarring elsewhere stable. No new opacities. Stable cardiac silhouette. Electronically Signed   By: Lowella Grip III M.D.   On: 03/17/2017 10:10   Dg Chest 2 View  Result Date: 03/14/2017 CLINICAL DATA:  Cough and fever for 48 hours. EXAM: CHEST  2 VIEW COMPARISON:  07/16/2016 FINDINGS: New airspace disease in the left lower lung consistent with pneumonia. Scarring and bronchiectasis in the right lung base.  Scarring in the lung apices. Heart size and pulmonary vascularity are normal. No pleural effusions. No pneumothorax. IMPRESSION: Airspace disease in the left lower lung consistent with pneumonia. Chronic scarring and bronchiectasis in the right lung base. Electronically Signed   By: Lucienne Capers M.D.   On: 03/14/2017 05:15     Assessment & Plan:   Bronchiectasis without acute exacerbation ([place]) Flare with associated LLL PNA  Plan  Patient Instructions  Finish Amoxicillin as planned.  Continue on Flutter valve . Mucinex Twice daily  .As needed  Congestion  Delsym As needed  Cough  Push Fluids and rest  Tylenol As needed   Follow up in 1 week with chest xray .  Please contact office for sooner follow up if symptoms do not improve or worsen or seek emergency care  follow up Dr. Lamonte Sakai  As planned this month and As needed         CAP (community acquired pneumonia) LLL PNA -clinically improving w/ abx . CXR is improving   Plan  Patient Instructions  Finish Amoxicillin as planned.  Continue on Flutter valve . Mucinex Twice daily  .As needed  Congestion  Delsym As needed  Cough  Push Fluids and rest  Tylenol As needed   Follow up in 1 week with chest xray .  Please contact office for sooner follow up if symptoms do not improve or worsen or seek emergency care  follow up Dr. Lamonte Sakai  As planned this month and As needed            Rexene Edison, NP 03/17/2017

## 2017-03-17 NOTE — Assessment & Plan Note (Signed)
LLL PNA -clinically improving w/ abx . CXR is improving   Plan  Patient Instructions  Finish Amoxicillin as planned.  Continue on Flutter valve . Mucinex Twice daily  .As needed  Congestion  Delsym As needed  Cough  Push Fluids and rest  Tylenol As needed   Follow up in 1 week with chest xray .  Please contact office for sooner follow up if symptoms do not improve or worsen or seek emergency care  follow up Dr. Lamonte Sakai  As planned this month and As needed

## 2017-03-17 NOTE — Patient Instructions (Addendum)
Finish Amoxicillin as planned.  Continue on Flutter valve . Mucinex Twice daily  .As needed  Congestion  Delsym As needed  Cough  Push Fluids and rest  Tylenol As needed   Follow up in 1 week with chest xray .  Please contact office for sooner follow up if symptoms do not improve or worsen or seek emergency care  follow up Dr. Lamonte Sakai  As planned this month and As needed

## 2017-03-20 ENCOUNTER — Telehealth: Payer: Self-pay | Admitting: Emergency Medicine

## 2017-03-20 MED ORDER — DOXYCYCLINE HYCLATE 100 MG PO TABS: 100.0000 mg | ORAL_TABLET | Freq: Two times a day (BID) | ORAL | 0 refills | Status: DC

## 2017-03-20 NOTE — Telephone Encounter (Signed)
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 I'd like to get her off that medication.   If she doesn/;t want another abx I am not sure how to best treat a PNA

## 2017-03-20 NOTE — Telephone Encounter (Signed)
Spoke with pt, who states since her OV on 03/17/17 she has not had any improvement in symptoms. Pt currently taking Amoxicillin. Pt reports of prod cough with yellow mucus, increased fatigue & nausea. Fever broke on Monday.  Pt is requesting additional recommendation.   RB please advise. Thanks.

## 2017-03-20 NOTE — Telephone Encounter (Signed)
I am not sure that the amoxicillin will be adequate to cover her pneumonia.  Given her several drug allergies I think the best choice is doxycycline 100 mg twice a day for the next 7 days.  Please have her change to this.  Call us next week to let us know how she is doing.  If she is not improved she needs to come back to be seen

## 2017-03-20 NOTE — Telephone Encounter (Signed)
Pt states she has developed diarrhea today. Pt states she does not think she can tolerate another abx, due to diarrhea and nausea.  RB please advise. Thanks.

## 2017-03-20 NOTE — Telephone Encounter (Signed)
Per RB verbally- eat yogurt and take pepto as directed to help with the Dirrhea Pt states she will take Doxy as prescribed once her diarrhea is under control. Pt request that doxy be sent to preferred pharmacy with a note to hold until she calls to fill. Rx has been sent to preferred pharmacy. Nothing further needed.

## 2017-03-24 ENCOUNTER — Ambulatory Visit (INDEPENDENT_AMBULATORY_CARE_PROVIDER_SITE_OTHER)
Admission: RE | Admit: 2017-03-24 | Discharge: 2017-03-24 | Disposition: A | Discharge status: Home / Self Care | Payer: Medicare Other | Source: Ambulatory Visit | Attending: Pulmonary Disease | Admitting: Pulmonary Disease

## 2017-03-24 ENCOUNTER — Encounter: Payer: Self-pay | Admitting: Pulmonary Disease

## 2017-03-24 ENCOUNTER — Telehealth: Payer: Self-pay | Admitting: Emergency Medicine

## 2017-03-24 ENCOUNTER — Ambulatory Visit (INDEPENDENT_AMBULATORY_CARE_PROVIDER_SITE_OTHER): Payer: Medicare Other | Admitting: Pulmonary Disease

## 2017-03-24 ENCOUNTER — Other Ambulatory Visit (INDEPENDENT_AMBULATORY_CARE_PROVIDER_SITE_OTHER): Payer: Medicare Other

## 2017-03-24 VITALS — BP 114/70 | HR 85 | Temp 97.1°F | Ht 62.0 in | Wt 96.2 lb

## 2017-03-24 DIAGNOSIS — J189 Pneumonia, unspecified organism: Secondary | ICD-10-CM

## 2017-03-24 DIAGNOSIS — K589 Irritable bowel syndrome without diarrhea: Secondary | ICD-10-CM | POA: Diagnosis not present

## 2017-03-24 DIAGNOSIS — J181 Lobar pneumonia, unspecified organism: Secondary | ICD-10-CM

## 2017-03-24 DIAGNOSIS — J479 Bronchiectasis, uncomplicated: Secondary | ICD-10-CM | POA: Diagnosis not present

## 2017-03-24 LAB — CBC WITH DIFFERENTIAL/PLATELET
BASOS ABS: 0.1 10*3/uL (ref 0.0–0.1)
Basophils Relative: 0.9 % (ref 0.0–3.0)
EOS PCT: 0.6 % (ref 0.0–5.0)
Eosinophils Absolute: 0.1 10*3/uL (ref 0.0–0.7)
HEMATOCRIT: 37.9 % (ref 36.0–46.0)
Hemoglobin: 12.7 g/dL (ref 12.0–15.0)
Lymphocytes Relative: 11.9 % — ABNORMAL LOW (ref 12.0–46.0)
Lymphs Abs: 1.4 10*3/uL (ref 0.7–4.0)
MCHC: 33.5 g/dL (ref 30.0–36.0)
MCV: 91.5 fl (ref 78.0–100.0)
Monocytes Absolute: 0.7 10*3/uL (ref 0.1–1.0)
Monocytes Relative: 6.4 % (ref 3.0–12.0)
NEUTROS ABS: 9.2 10*3/uL — AB (ref 1.4–7.7)
Neutrophils Relative %: 80.2 % — ABNORMAL HIGH (ref 43.0–77.0)
Platelets: 747 10*3/uL — ABNORMAL HIGH (ref 150.0–400.0)
RBC: 4.14 Mil/uL (ref 3.87–5.11)
RDW: 13.5 % (ref 11.5–15.5)
WBC: 11.5 10*3/uL — AB (ref 4.0–10.5)

## 2017-03-24 MED ORDER — METOCLOPRAMIDE HCL 10 MG PO TABS: 10.0000 mg | ORAL_TABLET | Freq: Three times a day (TID) | ORAL | 0 refills | Status: DC | PRN

## 2017-03-24 MED ORDER — CEFTRIAXONE SODIUM 1 G IJ SOLR
1.0000 g | Freq: Once | INTRAMUSCULAR | Status: AC
  Administered 2017-03-24: 1 g via INTRAMUSCULAR

## 2017-03-24 MED ORDER — LIDOCAINE HCL 1 % IJ SOLN
2.1000 mL | Freq: Once | INTRAMUSCULAR | Status: AC
Start: 2017-03-24 — End: 2017-03-24
  Administered 2017-03-24: 2.1 mL

## 2017-03-24 NOTE — Progress Notes (Signed)
   Subjective:    Patient ID: Monica [name], female    DOB: March 12, 1946, 71 y.o.   MRN: [id number]  HPI  71 yo female never smoker followed for Bronchiectasis  Hx of A Fib on Eliquis   She was recently diagnosed with left lower lobe pneumonia and was given Z-Pak.  She then had an ER visit and was given Augmentin but was not able to tolerate this beyond 6ds due to an upset stomach.  Was given doxycycline but continued to have increased nausea and has not started this yet. She called over the weekend for the same issues and has now been off antibiotics for the last 3 days   She reports nausea relieved by Zofran but no vomiting, has 1-2 stools per day.  She has not eaten solid food and days  chest x-ray on 11/12 on her second follow-up visit showed partial clearing of left lower lobe consolidation  Chest x-ray today continues to show clearing of left lower lobe infiltrate  Labs show WBC of 11.5 K improved from prior  Significant tests/ events reviewed  bronchoscopy in 2003 - cx's negative HRCT Chest 05/2016 >bronchiectasis increased mid to lower lobes, increased micronodularity /macronodularitiy/GG attenuation. Mucoid impaction . ?MAI  3/2018bronchoscopy that negative except for pansensitive pseudomonas. AFB neg .  Methacholine challenge on 09/13/16. Neg  spirometry was normal -10/2016   Past Medical History:  Diagnosis Date  . Adenomatous colon polyp 10/2003  . Allergic rhinitis   . Allergy    SEASONAL  . Bronchiectasis   . Chronic sinusitis   . Diverticulosis   . GERD (gastroesophageal reflux disease)   . Hearing loss   . Hemorrhoids   . Insomnia   . Menopausal disorder   . Osteoporosis   . Palpitations   . TMJ syndrome   . Urticaria      Review of Systems neg for any significant sore throat, dysphagia, itching, sneezing, nasal congestion or excess/ purulent secretions, fever, chills, sweats, unintended wt loss, pleuritic or exertional cp, hempoptysis, orthopnea pnd  or change in chronic leg swelling. Also denies presyncope, palpitations, heartburn, abdominal pain, nausea, vomiting, diarrhea or change in bowel or urinary habits, dysuria,hematuria, rash, arthralgias, visual complaints, headache, numbness weakness or ataxia.     Objective:   Physical Exam   Gen. Pleasant, well-nourished, in no distress ENT - no thrush, no post nasal drip Neck: No JVD, no thyromegaly, no carotid bruits Lungs: no use of accessory muscles, no dullness to percussion, LLLt rales no rhonchi  Cardiovascular: Rhythm regular, heart sounds  normal, no murmurs or gallops, no peripheral edema Musculoskeletal: No deformities, no cyanosis or clubbing         Assessment & Plan:

## 2017-03-24 NOTE — Patient Instructions (Addendum)
Ceftriaxone 1 gm IM x 1 Take zofran as needed for nausea Stay hydrated Call back if worse

## 2017-03-24 NOTE — Telephone Encounter (Signed)
Called and spoke with pt and she stated that she was seen by RA today and nothing further is needed.

## 2017-03-25 NOTE — Assessment & Plan Note (Signed)
She is unable to take any more oral antibiotics, last dose of amoxicillin was 4 days ago. Although chest x-ray has cleared, she still has mild leukocytosis We will give her 1 dose of ceftriaxone today IM

## 2017-03-25 NOTE — Assessment & Plan Note (Signed)
Discussed sputum clearance techniques

## 2017-03-25 NOTE — Assessment & Plan Note (Signed)
She has poor GI  tolerance to antibiotics -and will likely need a few more days for the symptoms to improve  Continue Zofran -we will give her Reglan as an alternative since Zofran did not seem to work as well

## 2017-03-26 ENCOUNTER — Ambulatory Visit: Payer: Medicare Other | Admitting: Adult Health

## 2017-04-02 ENCOUNTER — Encounter: Payer: Self-pay | Admitting: Emergency Medicine

## 2017-04-02 ENCOUNTER — Ambulatory Visit (INDEPENDENT_AMBULATORY_CARE_PROVIDER_SITE_OTHER): Payer: Medicare Other | Admitting: Emergency Medicine

## 2017-04-02 VITALS — BP 114/76 | HR 95 | Ht 62.0 in | Wt 97.0 lb

## 2017-04-02 DIAGNOSIS — R140 Abdominal distension (gaseous): Secondary | ICD-10-CM

## 2017-04-02 DIAGNOSIS — I480 Paroxysmal atrial fibrillation: Secondary | ICD-10-CM

## 2017-04-02 DIAGNOSIS — F4322 Adjustment disorder with anxiety: Secondary | ICD-10-CM

## 2017-04-02 MED ORDER — ALPRAZOLAM 0.5 MG PO TABS
0.5000 mg | ORAL_TABLET | Freq: Once | ORAL | 0 refills | Status: DC | PRN
Start: 2017-04-02 — End: 2018-03-09

## 2017-04-02 NOTE — Progress Notes (Signed)
Subjective:    Patient ID: Monica [name], female    DOB: 1945-10-20, 71 y.o.   MRN: [id number]  HPI Monica [name] is a 71 year old never smoker with a history of allergic rhinitis, GERD,urticaria, atrial fibrillation. She was followed in our office by Dr. Joya Gaskins for bronchiectasis, FOB cx-negative 2003.  She has been dealing with recurrent episodic flares of cough and bronchiectasis.  Repeat bronchoscopy 07/16/16 was negative with the exception of a pansensitive Pseudomonas.  She had a methacholine challenge 09/13/16 that did not show hyper responsiveness, normal spirometry.  Also had a reassuring allergy skin testing.  Most recent CT scan of the chest 05/31/16.  Since last visit she developed and he was treated for left lower lobe pneumonia following an URI, difficulty w the abx - didn't take final doxycycline due to concerns about GI sx and med interactions, but she did take augmentin, received ceftriaxone as well. She is having abd pain, no diarrhea. Her cough and breathing are better. Biggest issue right now is her GI sx and anxiety                                                                                                                                                                                                              Review of Systems As per history of present illness     Objective:   Physical Exam Vitals:   04/02/17 1634 04/02/17 1636  BP:  114/76  Pulse:  95  SpO2:  99%  Weight: 97 lb (44 kg)   Height: 5\' 2"  (1.575 m)    'Gen: Pleasant, thin woman, well-appearing, in no distress,  normal affect  ENT: No lesions,  mouth clear,  oropharynx clear, no postnasal drip  Neck: No JVD, no TMG, no carotid bruits  Lungs: No use of accessory muscles, clear without rales or rhonchi, no wheeze   Cardiovascular: RRR, heart sounds normal, no murmur or gallops, no peripheral edema  Musculoskeletal: No deformities, no cyanosis or clubbing  Neuro: alert, non focal  Skin: Warm,  no lesions or rashes  CT chest 05/31/16 --  COMPARISON:  Chest CT 01/10/2016.  FINDINGS: Cardiovascular: Heart size is normal. There is no significant pericardial fluid, thickening or pericardial calcification. Atherosclerotic calcifications in the thoracic aorta (mild). No calcifications are identified in the coronary arteries.  Mediastinum/Nodes: No pathologically enlarged mediastinal or hilar lymph nodes. Please note that accurate exclusion of hilar adenopathy is limited on noncontrast CT scans. Esophagus is unremarkable in appearance. No axillary lymphadenopathy.  Lungs/Pleura: High-resolution images demonstrate  widespread areas of cylindrical and varicose bronchiectasis, most evident throughout the mid to lower lungs bilaterally. The areas of greatest involvement demonstrate extensive thickening of the peribronchovascular interstitium with extensive peribronchovascular micro and macronodularity, associated peribronchovascular ground-glass attenuation and regional areas of architectural distortion, all of which have increased compared to prior study 01/10/2016. Relative sparing of the upper lungs. Inspiratory and expiratory imaging is unremarkable. No confluent consolidative airspace disease. No pleural effusions.  Upper Abdomen: Unremarkable.  Musculoskeletal: There are no aggressive appearing lytic or blastic lesions noted in the visualized portions of the skeleton.  IMPRESSION: 1. 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 2. Aortic atherosclerosis.       Assessment & Plan:  IBS (irritable bowel syndrome) She is having significant abdominal pain and bloating.  This appears to be exacerbated following her recent antibiotics but she is not having any diarrhea and my suspicion for C. difficile is low.   Of note she also started omeprazole when she was treated with antibiotics, question whether this could be a contributor to her side effects as well.  I have asked her to stop the omeprazole.  I will set her up to see [name] in gastroenterology to further evaluate.  GERD Stop omeprazole Continue Pepcid as needed  Bronchiectasis without acute exacerbation (HCC) Left lower lobe pneumonia treated, although her antibiotic regimen was fragmented she did complete a course of Augmentin and ceftriaxone.  Clinically improved.  No current cough.  Need to continue control rhinitis which is a big contributor to her chronic cough  ALLERGIC RHINITIS, SEASONAL Continue current allergy regimen  PAF (paroxysmal atrial fibrillation) (HCC) Exacerbated in setting of recent pneumonia.  Now on Eliquis.  ANXIETY DISORDER, SITUATIONAL, MILD She tells me that she would like to try Xanax.  She was prescribed this by Dr. Alain Marion in the past but actually never filled it and never took it.  Based on that I believe it would be acceptable for me to write a prescription for low-dose Xanax and then let her follow with Dr. Alain Marion to decide final dosing and regimen.  Baltazar Apo, MD, PhD 04/02/2017, 5:20 PM [facility] [phone] or if no answer [phone]

## 2017-04-02 NOTE — Assessment & Plan Note (Signed)
Continue current allergy regimen 

## 2017-04-02 NOTE — Assessment & Plan Note (Signed)
Exacerbated in setting of recent pneumonia.  Now on Eliquis.

## 2017-04-02 NOTE — Assessment & Plan Note (Signed)
Stop omeprazole Continue Pepcid as needed

## 2017-04-02 NOTE — Assessment & Plan Note (Signed)
Left lower lobe pneumonia treated, although her antibiotic regimen was fragmented she did complete a course of Augmentin and ceftriaxone.  Clinically improved.  No current cough.  Need to continue control rhinitis which is a big contributor to her chronic cough

## 2017-04-02 NOTE — Patient Instructions (Addendum)
Please continue your flonase, loratadine Continue zantac as needed Stop omeprazole now Keep zofran available to use as needed for nausea.  We will refer you to see Dr Fuller Plan  Take xanax 0.5mg  once a day if needed for anxiety. We will give you a small prescription to fill for this until you can discuss further dosing with Dr Alain Marion  Follow with Dr Lamonte Sakai in 3 months or sooner if you have any problems.

## 2017-04-02 NOTE — Assessment & Plan Note (Signed)
She is having significant abdominal pain and bloating.  This appears to be exacerbated following her recent antibiotics but she is not having any diarrhea and my suspicion for C. difficile is low.  Of note she also started omeprazole when she was treated with antibiotics, question whether this could be a contributor to her side effects as well.  I have asked her to stop the omeprazole.  I will set her up to see [name] in gastroenterology to further evaluate.

## 2017-04-02 NOTE — Assessment & Plan Note (Signed)
She tells me that she would like to try Xanax.  She was prescribed this by Dr. Alain Marion in the past but actually never filled it and never took it.  Based on that I believe it would be acceptable for me to write a prescription for low-dose Xanax and then let her follow with Dr. Alain Marion to decide final dosing and regimen.

## 2017-04-04 ENCOUNTER — Encounter: Payer: Self-pay | Admitting: Physician Assistant

## 2017-04-04 ENCOUNTER — Ambulatory Visit (INDEPENDENT_AMBULATORY_CARE_PROVIDER_SITE_OTHER): Payer: Medicare Other | Admitting: Physician Assistant

## 2017-04-04 ENCOUNTER — Telehealth: Payer: Self-pay | Admitting: Gastroenterology

## 2017-04-04 VITALS — BP 110/70 | HR 93 | Ht 62.0 in | Wt 97.0 lb

## 2017-04-04 DIAGNOSIS — R194 Change in bowel habit: Secondary | ICD-10-CM

## 2017-04-04 DIAGNOSIS — R1032 Left lower quadrant pain: Secondary | ICD-10-CM

## 2017-04-04 DIAGNOSIS — R1031 Right lower quadrant pain: Secondary | ICD-10-CM | POA: Diagnosis not present

## 2017-04-04 DIAGNOSIS — R110 Nausea: Secondary | ICD-10-CM

## 2017-04-04 MED ORDER — VSL#3 PO CAPS: 1.0000 | ORAL_CAPSULE | Freq: Two times a day (BID) | ORAL | 0 refills | Status: DC

## 2017-04-04 MED ORDER — SUCRALFATE 1 G PO TABS: 1.0000 g | ORAL_TABLET | Freq: Two times a day (BID) | ORAL | 1 refills | Status: DC

## 2017-04-04 MED ORDER — HYOSCYAMINE SULFATE 0.125 MG SL SUBL: 0.1250 mg | SUBLINGUAL_TABLET | SUBLINGUAL | 0 refills | Status: DC | PRN

## 2017-04-04 NOTE — Progress Notes (Signed)
Chief Complaint: Abdominal pain, bloating, nausea  HPI:    Monica [name] is a 71 year old female with a past medical history as listed below, who follows with [name], who was referred to me by Plotnikov, Evie Lacks, MD for a complaint of abdominal pain, bloating and nausea.      Patient was last seen in clinic by [name] 06/14/14.  It was discussed that she had a long history of reflux controlled on Protonix for several years but after being diagnosed with osteoporosis, this was changed to Zantac.   She was taking this 150 mg twice daily.  Her reflux symptoms were under better control but not well controlled.  She had modified her diet and had noticed a gradual 4 pound weight loss over the past 2-3 months.  She had frequent lower and generalized abdominal discomfort that does not appear related to meals or bowel movements.  She also described mild constipation.  Recommendations at that time included an increase in Ranitidine 300 mg twice daily or changing to a PPI such as pantoprazole or omeprazole.  She was scheduled for an EGD.  Abdominal pain was thought possibly related to IBS.  It was discussed that she should have a CT scan of the abdomen and pelvis if her symptoms persisted.  It was recommended she increase fiber and water to help with mild constipation.    EGD 06/24/14 variable Z line EGD was otherwise normal.  It was recommended she change her H2 blocker to PPI if symptoms were not controlled in the next few weeks.    Review of chart patient has been followed for pneumonia recently.    Today, the patient presents to clinic and explains that about 3 weeks ago or so she was seen in urgent care for pneumonia.  She was started on a Z-Pak and amoxicillin.  Patient tells me ever since taking the antibiotic she has had a "flare of her abdominal symptoms".  Patient tells me that she is now experiencing lower abdominal discomfort and pressure/spasm as well as some intermittent loose stools.  Patient tells  me that occasionally she will have solid stools and then she may have 1 or 2 loose stools in a day.  Patient describes this seems to "clear up" and then it will come back.  Patient has also been very bloated as well as she is generally uncomfortable and "tired of feeling sick".  She has added Omeprazole 20mg  qd to her reflux meds after being on abx.    Patient's social history is positive for being stressed and anxious lately.  Patient tells me that she was just given a prescription for Xanax which she is going to start.    Patient denies fever, chills, blood in her stool, melena, vomiting or symptoms that awaken her at night.  Past Medical History:  Diagnosis Date  . Adenomatous colon polyp 10/2003  . Allergic rhinitis   . Allergy    SEASONAL  . Bronchiectasis   . Chronic sinusitis   . Diverticulosis   . GERD (gastroesophageal reflux disease)   . Hearing loss   . Hemorrhoids   . Insomnia   . Menopausal disorder   . Osteoporosis   . Palpitations   . TMJ syndrome   . Urticaria     Past Surgical History:  Procedure Laterality Date  . COLONOSCOPY    . NASAL SINUS SURGERY    . TYMPANOSTOMY    . VIDEO BRONCHOSCOPY Bilateral 07/16/2016   Procedure: VIDEO BRONCHOSCOPY WITH  FLUORO;  Surgeon: Collene Gobble, MD;  Location: [facility];  Service: Cardiopulmonary;  Laterality: Bilateral;    Current Outpatient Medications  Medication Sig Dispense Refill  . acetaminophen (TYLENOL) 500 MG tablet Take 500 mg by mouth every 6 (six) hours as needed for moderate pain or headache.    . ALPRAZolam (XANAX) 0.5 MG tablet Take 1 tablet (0.5 mg total) by mouth once as needed for up to 1 dose for anxiety. 15 tablet 0  . CALCIUM-VITAMIN D PO Take 1 tablet by mouth daily.    . Cholecalciferol (VITAMIN D) 1000 UNITS capsule Take 1,000 Units by mouth daily.      Marland Kitchen dextromethorphan (DELSYM) 30 MG/5ML liquid Take 15-30 mLs 2 (two) times daily as needed by mouth for cough.    Marland Kitchen ELIQUIS 5 MG TABS tablet TAKE 1  TABLET TWICE DAILY. 60 tablet 0  . Estradiol 10 MCG TABS Place 10 mcg 2 (two) times a week vaginally. Pt does not have set days    . fluticasone (FLONASE) 50 MCG/ACT nasal spray Place 2 sprays into both nostrils at bedtime as needed for allergies or rhinitis.    Marland Kitchen guaiFENesin (MUCINEX) 600 MG 12 hr tablet Take 600 mg 2 (two) times daily as needed by mouth for cough.    . Lactobacillus Rhamnosus, GG, (CULTURELLE PO) Take 1 tablet by mouth daily.    Marland Kitchen loratadine (CLARITIN) 10 MG tablet Take 10 mg daily as needed by mouth for allergies.     . metoprolol tartrate (LOPRESSOR) 25 MG tablet TAKE 1/2 TABLET DAILY WHEN NEEDED. 30 tablet 5  . Multiple Vitamins-Minerals (CENTRUM SILVER PO) Take 1 tablet by mouth daily.     Marland Kitchen omeprazole (PRILOSEC) 20 MG capsule Take 20 mg daily by mouth.    . ondansetron (ZOFRAN) 4 MG tablet Take 1 tablet (4 mg total) every 6 (six) hours as needed by mouth for nausea. 12 tablet 0  . promethazine-codeine (PHENERGAN WITH CODEINE) 6.25-10 MG/5ML syrup Take 5 mLs every 6 (six) hours as needed by mouth for cough.    . ranitidine (ZANTAC) 300 MG tablet Take 150 mg every evening by mouth.      No current facility-administered medications for this visit.     Allergies as of 04/04/2017 - Review Complete 04/04/2017  Allergen Reaction Noted  . Amoxicillin-pot clavulanate Diarrhea   . Avelox [moxifloxacin hcl in nacl] Nausea Only 11/25/2012  . Bactrim [sulfamethoxazole-trimethoprim] Other (See Comments)   . Benzonatate Other (See Comments) 10/20/2009  . Ciprofloxacin Rash 03/20/2015    Family History  Problem Relation Age of Onset  . Rectal cancer Mother        died age 74  . Seizures Mother   . Colon cancer Mother 41  . Cancer Mother   . Heart disease Father        dies age 36  . Hypertension Father   . Heart disease Maternal Grandfather   . AVM Brother   . Cancer Maternal Grandmother   . Cancer Paternal Uncle        multiple uncles with cancer    Social History     Socioeconomic History  . Marital status: Married    Spouse name: [name]  . Number of children: 0  . Years of education: BSN  . Highest education level: Not on file  Social Needs  . Financial resource strain: Not on file  . Food insecurity - worry: Not on file  . Food insecurity - inability: Not on file  .  Transportation needs - medical: Not on file  . Transportation needs - non-medical: Not on file  Occupational History  . Occupation: retired Therapist, sports  Tobacco Use  . Smoking status: Never Smoker  . Smokeless tobacco: Never Used  Substance and Sexual Activity  . Alcohol use: Yes    Alcohol/week: 1.2 oz    Types: 2 Glasses of wine per week  . Drug use: No  . Sexual activity: Not on file  Other Topics Concern  . Not on file  Social History Narrative   Lives with husband   Caffeine use: 1 cup tea per day    Review of Systems:    Constitutional: No fever Cardiovascular: No chest pain Respiratory: No SOB  Gastrointestinal: See HPI and otherwise negative   Physical Exam:  Vital signs: BP 110/70   Pulse 93   Ht 5\' 2"  (1.575 m)   Wt 97 lb (44 kg)   BMI 17.74 kg/m    Constitutional:   Pleasant Caucasian female appears to be in NAD, Well developed, Well nourished, alert and cooperative Respiratory: Respirations even and unlabored. Lungs clear to auscultation bilaterally.   No wheezes, crackles, or rhonchi.  Cardiovascular: Normal S1, S2. No MRG. Regular rate and rhythm. No peripheral edema, cyanosis or pallor.  Gastrointestinal:  Soft, nondistended, mild b/l lower ttp, No rebound or guarding. Normal bowel sounds. No appreciable masses or hepatomegaly. Psychiatric:  Demonstrates good judgement and reason without abnormal affect or behaviors.  RELEVANT LABS AND IMAGING: CBC    Component Value Date/Time   WBC 11.5 (H) 03/24/2017 1018   RBC 4.14 03/24/2017 1018   HGB 12.7 03/24/2017 1018   HGB 13.0 09/25/2016 1017   HCT 37.9 03/24/2017 1018   HCT 37.7 09/25/2016 1017   PLT  747.0 (H) 03/24/2017 1018   PLT 411 (H) 09/25/2016 1017   MCV 91.5 03/24/2017 1018   MCV 88 09/25/2016 1017   MCH 30.8 03/14/2017 0424   MCHC 33.5 03/24/2017 1018   RDW 13.5 03/24/2017 1018   RDW 12.8 09/25/2016 1017   LYMPHSABS 1.4 03/24/2017 1018   LYMPHSABS 1.5 09/25/2016 1017   MONOABS 0.7 03/24/2017 1018   EOSABS 0.1 03/24/2017 1018   EOSABS 0.2 09/25/2016 1017   BASOSABS 0.1 03/24/2017 1018   BASOSABS 0.0 09/25/2016 1017    CMP     Component Value Date/Time   NA 137 03/14/2017 0424   K 3.6 03/14/2017 0424   CL 102 03/14/2017 0424   CO2 26 03/14/2017 0424   GLUCOSE 131 (H) 03/14/2017 0424   BUN 10 03/14/2017 0424   CREATININE 0.70 03/14/2017 0424   CALCIUM 8.8 (L) 03/14/2017 0424   PROT 6.9 09/28/2014 0906   ALBUMIN 4.1 09/28/2014 0906   AST 28 09/28/2014 0906   ALT 17 09/28/2014 0906   ALKPHOS 64 09/28/2014 0906   BILITOT 1.1 09/28/2014 0906   GFRNONAA >60 03/14/2017 0424   GFRAA >60 03/14/2017 0424    Assessment: 1.  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  Change in bowel habits: After antibiotics as above 3.  Nausea: After antibiotics as above  Plan: 1.  Patient has noticed an increase in abdominal pain, change in bowel habits and nausea ever since being on Amoxicillin for pneumonia.  Discussed that likely this represents a flare of her IBS. 2.  Recommend the patient stop her Culturelle and  start of VSL capsules, 1 capsule twice daily.  She was instructed not to take these with a hot drink or hot food. 3.  Patient was started on Carafate 2-3 times a day, an hour before eating.  #60 with 1 refill 4.  Prescribed Hyoscyamine Sulfate 0.125 mg sublingual tabs every 4-6 hours as needed.  (Also discussed scheduling this 20 minutes prior to meals) #90 with 1 refill 5.  Discussed that  stool studies would likely not be necessary at this time as patient is experiencing some solid stools and does not have volumous liquid diarrhea. 6.  Discussed that the patient can start her Xanax.  In fact this may help her symptoms as well. 7.  Patient to follow in clinic with me in 2-3 weeks.  Ellouise Newer, PA-C [facility] 04/04/2017, 3:26 PM  Cc: Plotnikov, Evie Lacks, MD

## 2017-04-04 NOTE — Telephone Encounter (Signed)
Patient was treated with antibiotics and since has had excess gas and bloating.  She is uncomfortable and after 2 weeks her symptoms have failed to improve.  She will come in today and see Ellouise Newer, [place] at 3:15

## 2017-04-04 NOTE — Patient Instructions (Signed)
We have sent the following medications to your pharmacy for you to pick up at your convenience:  VSL #3 capsules one twice a day. Do not take with hot drinks or hot food.   Carafate 1 gram capsule 2-3 daily 1 hour before eating breakfast and dinner.   Hyoscyamine 0.125 mg every 4-6 hours. Ok to take 15-20 minutes before meals.

## 2017-04-04 NOTE — Progress Notes (Signed)
Reviewed and agree with initial management plan.  Leva Baine T. Moussa Wiegand, MD FACG 

## 2017-04-07 ENCOUNTER — Telehealth: Payer: Self-pay | Admitting: Physician Assistant

## 2017-04-07 NOTE — Telephone Encounter (Signed)
The pt only began taking the recommended meds late Friday.  I advised her to stay on full liquids for now and call back if she does not begin to improve over the next couple days

## 2017-04-11 ENCOUNTER — Telehealth: Payer: Self-pay | Admitting: Gastroenterology

## 2017-04-11 NOTE — Telephone Encounter (Signed)
The pt has been advised to keep her appt as scheduled with Anderson Malta to discuss symptoms. The pt agreed

## 2017-04-18 ENCOUNTER — Ambulatory Visit (INDEPENDENT_AMBULATORY_CARE_PROVIDER_SITE_OTHER): Payer: Medicare Other | Admitting: Physician Assistant

## 2017-04-18 ENCOUNTER — Encounter: Payer: Self-pay | Admitting: Physician Assistant

## 2017-04-18 VITALS — BP 118/72 | HR 68 | Ht 62.0 in | Wt 98.2 lb

## 2017-04-18 DIAGNOSIS — R1084 Generalized abdominal pain: Secondary | ICD-10-CM

## 2017-04-18 DIAGNOSIS — R110 Nausea: Secondary | ICD-10-CM | POA: Diagnosis not present

## 2017-04-18 DIAGNOSIS — R140 Abdominal distension (gaseous): Secondary | ICD-10-CM | POA: Diagnosis not present

## 2017-04-18 NOTE — Progress Notes (Signed)
Chief Complaint: Follow-up abdominal pain, bloating and nausea  HPI:    Monica [name] is a 71 year old female with a past medical history as listed below, who follows with [name] and returns to clinic today for follow-up of her abdominal pain, bloating and nausea.    Please call patient was last seen in clinic 04/04/17 at that time describes recently being treated for pneumonia with a Z-Pak.  Ever since taking the antibiotic she had a "flare of her abdominal symptoms".  She described increased lower abdominal discomfort and pressure/spasm as well as intermittent loose stools.  She was also experiencing a lot of bloating and had recently restarted her Omeprazole 20 mg daily once again.  At that time, patient was changed from Culturelle to VSL #3 and started on Carafate 2-3 times a day an hour before eating.  She was also prescribed Hyoscyamine sulfate every 4-6 hours as needed.  She was instructed to start her recent prescription for Xanax as this may help her symptoms as well, after she explained a lot of stress and anxiety in her life recently.    Today, the patient expresses that she used her Levsin 4 times a day for about a week and then started to decrease this to an as-needed basis, she completely came off of this towards the beginning of last week but did need another couple of doses on Thursday as she had some abdominal cramping and diarrhea, this Levsin did help her symptoms.     Patient also used the Carafate for a week and believes that this medication was making her somewhat nauseous, so she stopped this.      Currently, the patient is feeling much better.  She does occasionally have some "nausea back-and-forth", but her stools have returned to normal and her bloating is better.  The patient has stayed on her VSL #3 and plans to finish out the month with this before returning to her regular Culturelle.  Patient tells me she has also switched back to Zantac from Omeprazole as she is worried  about the side effect of decreased bone density and tries to only use a PPI when she is having a "flare".  Overall the patient is much improved and happy.    Patient denies fever, chills, blood in her stool, melena, weight loss, fatigue and anorexia, nausea, vomiting, heartburn or reflux.   Past Medical History:  Diagnosis Date  . Adenomatous colon polyp 10/2003  . Allergic rhinitis   . Allergy    SEASONAL  . Bronchiectasis   . Chronic sinusitis   . Diverticulosis   . GERD (gastroesophageal reflux disease)   . Hearing loss   . Hemorrhoids   . Insomnia   . Menopausal disorder   . Osteoporosis   . Palpitations   . TMJ syndrome   . Urticaria     Past Surgical History:  Procedure Laterality Date  . COLONOSCOPY    . NASAL SINUS SURGERY    . TYMPANOSTOMY    . VIDEO BRONCHOSCOPY Bilateral 07/16/2016   Procedure: VIDEO BRONCHOSCOPY WITH FLUORO;  Surgeon: Collene Gobble, MD;  Location: [facility];  Service: Cardiopulmonary;  Laterality: Bilateral;    Current Outpatient Medications  Medication Sig Dispense Refill  . acetaminophen (TYLENOL) 500 MG tablet Take 500 mg by mouth every 6 (six) hours as needed for moderate pain or headache.    . ALPRAZolam (XANAX) 0.5 MG tablet Take 1 tablet (0.5 mg total) by mouth once as needed for up to 1  dose for anxiety. 15 tablet 0  . CALCIUM-VITAMIN D PO Take 1 tablet by mouth daily.    . Cholecalciferol (VITAMIN D) 1000 UNITS capsule Take 1,000 Units by mouth daily.      Marland Kitchen dextromethorphan (DELSYM) 30 MG/5ML liquid Take 15-30 mLs 2 (two) times daily as needed by mouth for cough.    Marland Kitchen ELIQUIS 5 MG TABS tablet TAKE 1 TABLET TWICE DAILY. 60 tablet 0  . Estradiol 10 MCG TABS Place 10 mcg 2 (two) times a week vaginally. Pt does not have set days    . fluticasone (FLONASE) 50 MCG/ACT nasal spray Place 2 sprays into both nostrils at bedtime as needed for allergies or rhinitis.    Marland Kitchen guaiFENesin (MUCINEX) 600 MG 12 hr tablet Take 600 mg 2 (two) times daily  as needed by mouth for cough.    . Lactobacillus Rhamnosus, GG, (CULTURELLE PO) Take 1 tablet by mouth daily.    Marland Kitchen loratadine (CLARITIN) 10 MG tablet Take 10 mg daily as needed by mouth for allergies.     . metoprolol tartrate (LOPRESSOR) 25 MG tablet TAKE 1/2 TABLET DAILY WHEN NEEDED. 30 tablet 5  . Multiple Vitamins-Minerals (CENTRUM SILVER PO) Take 1 tablet by mouth daily.     Marland Kitchen omeprazole (PRILOSEC) 20 MG capsule Take 20 mg by mouth as needed.     . ondansetron (ZOFRAN) 4 MG tablet Take 1 tablet (4 mg total) every 6 (six) hours as needed by mouth for nausea. 12 tablet 0  . Probiotic Product (VSL#3) CAPS Take 1 capsule by mouth 2 (two) times daily. 60 capsule 0  . ranitidine (ZANTAC) 300 MG tablet Take 150 mg every evening by mouth.     . sucralfate (CARAFATE) 1 g tablet Take 1 tablet (1 g total) by mouth 2 (two) times daily. 60 tablet 1   No current facility-administered medications for this visit.     Allergies as of 04/18/2017 - Review Complete 04/18/2017  Allergen Reaction Noted  . Amoxicillin-pot clavulanate Diarrhea   . Avelox [moxifloxacin hcl in nacl] Nausea Only 11/25/2012  . Bactrim [sulfamethoxazole-trimethoprim] Other (See Comments)   . Benzonatate Other (See Comments) 10/20/2009  . Ciprofloxacin Rash 03/20/2015    Family History  Problem Relation Age of Onset  . Rectal cancer Mother        died age 53  . Seizures Mother   . Colon cancer Mother 57  . Cancer Mother   . Heart disease Father        dies age 80  . Hypertension Father   . Heart disease Maternal Grandfather   . AVM Brother   . Cancer Maternal Grandmother   . Cancer Paternal Uncle        multiple uncles with cancer    Social History   Socioeconomic History  . Marital status: Married    Spouse name: Monica [name]  . Number of children: 0  . Years of education: BSN  . Highest education level: Not on file  Social Needs  . Financial resource strain: Not on file  . Food insecurity - worry: Not on file  .  Food insecurity - inability: Not on file  . Transportation needs - medical: Not on file  . Transportation needs - non-medical: Not on file  Occupational History  . Occupation: retired Therapist, sports  Tobacco Use  . Smoking status: Never Smoker  . Smokeless tobacco: Never Used  Substance and Sexual Activity  . Alcohol use: Yes    Alcohol/week: 1.2 oz  Types: 2 Glasses of wine per week  . Drug use: No  . Sexual activity: Not on file  Other Topics Concern  . Not on file  Social History Narrative   Lives with husband   Caffeine use: 1 cup tea per day    Review of Systems:    Constitutional: No weight loss, fever or chills Cardiovascular: No chest pain Respiratory: No SOB  Gastrointestinal: See HPI and otherwise negative   Physical Exam:  Vital signs: BP 118/72   Pulse 68   Ht 5\' 2"  (1.575 m)   Wt 98 lb 3.2 oz (44.5 kg)   BMI 17.96 kg/m  Constitutional:   Pleasant Caucasian female appears to be in NAD, Well developed, Well nourished, alert and cooperative Respiratory: Respirations even and unlabored. Lungs clear to auscultation bilaterally.   No wheezes, crackles, or rhonchi.  Cardiovascular: Normal S1, S2. No MRG. Regular rate and rhythm. No peripheral edema, cyanosis or pallor.  Gastrointestinal:  Soft, nondistended, mild generalized ttp, No rebound or guarding. Normal bowel sounds. No appreciable masses or hepatomegaly. Psychiatric: Demonstrates good judgement and reason without abnormal affect or behaviors.  No recent labs or imaging.  Assessment: 1.  Abdominal pain: Better, back to "typical IBS" 2.  Bloating: Relieved after Hyoscyamine, Carafate and VSL 3.  Nausea: Continues off and on, but much improved  Plan: 1.  Patient is much improved.  Discussed that she can stop her Carafate, go back to Zantac and return to Culturelle after this month. 2.  Discussed the patient can keep the Hyoscyamine on hand and use this as needed for any worse days of lower abdominal cramping and  loose stools. 3.  Patient to follow in clinic as needed in the future.  She does have an appointment with [name] on January 17 but would like to keep this because "then I know I will not need it" and she will cancel then.  Ellouise Newer, PA-C [facility] 04/18/2017, 1:44 PM  Cc: Plotnikov, Evie Lacks, MD

## 2017-04-21 NOTE — Progress Notes (Signed)
Reviewed and agree with initial management plan.  Daejon Lich T. Prakash Kimberling, MD FACG 

## 2017-05-20 DIAGNOSIS — D2262 Melanocytic nevi of left upper limb, including shoulder: Secondary | ICD-10-CM | POA: Diagnosis not present

## 2017-05-20 DIAGNOSIS — Z23 Encounter for immunization: Secondary | ICD-10-CM | POA: Diagnosis not present

## 2017-05-20 DIAGNOSIS — L720 Epidermal cyst: Secondary | ICD-10-CM | POA: Diagnosis not present

## 2017-05-20 DIAGNOSIS — Z86018 Personal history of other benign neoplasm: Secondary | ICD-10-CM | POA: Diagnosis not present

## 2017-05-20 DIAGNOSIS — L821 Other seborrheic keratosis: Secondary | ICD-10-CM | POA: Diagnosis not present

## 2017-05-22 ENCOUNTER — Ambulatory Visit: Payer: Medicare Other | Admitting: Gastroenterology

## 2017-06-02 DIAGNOSIS — Z1231 Encounter for screening mammogram for malignant neoplasm of breast: Secondary | ICD-10-CM | POA: Diagnosis not present

## 2017-06-11 NOTE — Progress Notes (Signed)
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   She has infrequent palpitations, usually when checking her rhythm these are associated with PVCs rather than atrial fibrillation. She has no evidence of structural heart disease and has a relatively small left atrium. She has never had embolic events.  She has not had any significant palpitations since last September.  Couple of times she felt her heart rate accelerated but all lasted for a minute or 2 resolved gradually, suggesting that it might of been sinus tachycardia  She has not had any focal neurological events, other signs of systemic embolism and has not had any serious bleeding problems.  She was seen in the emergency room in November with an upset stomach while taking azithromycin for acute respiratory infection.  She lost a lot of weight and is now clearly underweight with a BMI less than 18.  Other comorbid conditions include bronchiectasis, gastroesophageal reflux disease, irritable bowel syndrome, osteoporosis, chronic sinusitis and mild anxiety disorder..  Past Medical History:  Diagnosis Date  . Adenomatous colon polyp 10/2003  . Allergic rhinitis   . Allergy    SEASONAL  . Bronchiectasis   . Chronic sinusitis   . Diverticulosis   . GERD (gastroesophageal reflux disease)   . Hearing loss   . Hemorrhoids   . Insomnia   . Menopausal disorder   . Osteoporosis   . Palpitations   . TMJ syndrome   . Urticaria     Past Surgical History:  Procedure Laterality Date  . COLONOSCOPY    . NASAL SINUS SURGERY    . TYMPANOSTOMY    . VIDEO BRONCHOSCOPY Bilateral 07/16/2016   Procedure: VIDEO BRONCHOSCOPY WITH FLUORO;  Surgeon: Collene Gobble, MD;  Location: [facility];  Service: Cardiopulmonary;  Laterality: Bilateral;    Current Medications: Outpatient Medications Prior to Visit  Medication Sig Dispense Refill  . acetaminophen (TYLENOL) 500 MG tablet Take 500 mg by mouth every 6 (six) hours as needed for moderate pain or headache.    . ALPRAZolam (XANAX) 0.5 MG tablet Take 1 tablet (0.5 mg total) by mouth once as needed for up to 1 dose for anxiety. 15 tablet 0  . CALCIUM-VITAMIN D PO Take 1 tablet by mouth daily.    . Cholecalciferol (VITAMIN D) 1000 UNITS capsule Take 1,000 Units by mouth daily.      Marland Kitchen dextromethorphan (DELSYM) 30 MG/5ML liquid Take 15-30 mLs 2 (two) times daily as needed by mouth for cough.    Marland Kitchen ELIQUIS 5 MG TABS tablet TAKE 1 TABLET TWICE DAILY. 60 tablet 0  . Estradiol 10 MCG TABS Place 10 mcg 2 (two) times a week vaginally. Pt does not have set days    . fluticasone (FLONASE) 50 MCG/ACT nasal spray Place 2 sprays into both nostrils at bedtime as needed for allergies or rhinitis.    Marland Kitchen guaiFENesin (MUCINEX) 600 MG 12 hr tablet Take 600 mg 2 (two) times daily as needed by mouth for cough.    . Lactobacillus Rhamnosus, GG, (CULTURELLE PO) Take 1 tablet by mouth daily.    Marland Kitchen loratadine (CLARITIN) 10 MG tablet Take 10 mg daily  as needed by mouth for allergies.     . metoprolol tartrate (LOPRESSOR) 25 MG tablet TAKE 1/2 TABLET DAILY WHEN NEEDED. 30 tablet 5  . Multiple Vitamins-Minerals (CENTRUM SILVER PO) Take 1 tablet by mouth daily.     Marland Kitchen omeprazole (PRILOSEC) 20 MG capsule Take 20 mg by mouth as needed.     . ranitidine (ZANTAC) 300 MG tablet Take 150 mg every evening by mouth.     . ondansetron (ZOFRAN) 4 MG tablet Take 1 tablet (4 mg total) every 6 (six) hours as needed by mouth for nausea. (Patient not taking: Reported on 06/12/2017) 12 tablet 0  . Probiotic Product (VSL#3) CAPS Take 1 capsule by mouth 2 (two) times daily. (Patient not  taking: Reported on 06/12/2017) 60 capsule 0  . sucralfate (CARAFATE) 1 g tablet Take 1 tablet (1 g total) by mouth 2 (two) times daily. (Patient not taking: Reported on 06/12/2017) 60 tablet 1   No facility-administered medications prior to visit.      Allergies:   Amoxicillin-pot clavulanate; Avelox [moxifloxacin hcl in nacl]; Bactrim [sulfamethoxazole-trimethoprim]; Benzonatate; and Ciprofloxacin   Social History   Socioeconomic History  . Marital status: Married    Spouse name: Monica [name]  . Number of children: 0  . Years of education: BSN  . Highest education level: None  Social Needs  . Financial resource strain: None  . Food insecurity - worry: None  . Food insecurity - inability: None  . Transportation needs - medical: None  . Transportation needs - non-medical: None  Occupational History  . Occupation: retired Therapist, sports  Tobacco Use  . Smoking status: Never Smoker  . Smokeless tobacco: Never Used  Substance and Sexual Activity  . Alcohol use: Yes    Alcohol/week: 1.2 oz    Types: 2 Glasses of wine per week  . Drug use: No  . Sexual activity: None  Other Topics Concern  . None  Social History Narrative   Lives with husband   Caffeine use: 1 cup tea per day     Family History:  The patient's family history includes AVM in her brother; Cancer in her maternal grandmother, mother, and paternal uncle; Colon cancer (age of onset: 2) in her mother; Heart disease in her father and maternal grandfather; Hypertension in her father; Rectal cancer in her mother; Seizures in her mother.   ROS:   Please see the history of present illness.    ROS All other systems reviewed and are negative.   PHYSICAL EXAM:   VS:  BP 94/61   Pulse 80   Ht 5\' 2"  (1.575 m)   Wt 96 lb 12.8 oz (43.9 kg)   SpO2 100%   BMI 17.70 kg/m      General: Alert, oriented x3, no distress, very lean Head: no evidence of trauma, PERRL, EOMI, no exophtalmos or lid lag, no myxedema, no xanthelasma; normal ears, nose  and oropharynx Neck: normal jugular venous pulsations and no hepatojugular reflux; brisk carotid pulses without delay and no carotid bruits Chest: clear to auscultation, no signs of consolidation by percussion or palpation, normal fremitus, symmetrical and full respiratory excursions Cardiovascular: normal position and quality of the apical impulse, regular rhythm, normal first and second heart sounds, no murmurs, rubs or gallops Abdomen: no tenderness or distention, no masses by palpation, no abnormal pulsatility or arterial bruits, normal bowel sounds, no hepatosplenomegaly Extremities: no clubbing, cyanosis or edema; 2+ radial, ulnar and brachial pulses bilaterally; 2+ right femoral, posterior tibial and dorsalis pedis pulses;  2+ left femoral, posterior tibial and dorsalis pedis pulses; no subclavian or femoral bruits Neurological: grossly nonfocal Psych: Normal mood and affect   Wt Readings from Last 3 Encounters:  06/12/17 96 lb 12.8 oz (43.9 kg)  04/18/17 98 lb 3.2 oz (44.5 kg)  04/04/17 97 lb (44 kg)      Studies/Labs Reviewed:   EKG:  EKG is not ordered today.    Recent Labs: 03/14/2017: BUN 10; Creatinine, Ser 0.70; Potassium 3.6; Sodium 137 03/24/2017: Hemoglobin 12.7; Platelets 747.0   Lipid Panel    Component Value Date/Time   CHOL 157 04/13/2014 1156   TRIG 71.0 04/13/2014 1156   HDL 65.60 04/13/2014 1156   CHOLHDL 2 04/13/2014 1156   VLDL 14.2 04/13/2014 1156   LDLCALC 77 04/13/2014 1156     ASSESSMENT:    1. Paroxysmal atrial fibrillation (HCC)   2. Long term current use of anticoagulant   3. Underweight      PLAN:   1. AFib: Very infrequent episodes, none in the last 4 months 2. Eliquis: Tolerating anticoagulation without any serious bleeding problems  Medication Adjustments/Labs and Tests Ordered: Current medicines are reviewed at length with the patient today.  Concerns regarding medicines are outlined above.  Medication changes, Labs and Tests  ordered today are listed in the Patient Instructions below. Patient Instructions  Dr Sallyanne Kuster recommends that you schedule a follow-up appointment in 6 months. You will receive a reminder letter in the mail two months in advance. If you don't receive a letter, please call our office to schedule the follow-up appointment.  If you need a refill on your cardiac medications before your next appointment, please call your pharmacy.    Signed, Sanda Klein, MD  06/12/2017 9:03 AM    [facility] [place], [place], [place]  [address] Phone: [phone]; Fax: [phone]

## 2017-06-12 ENCOUNTER — Encounter: Payer: Self-pay | Admitting: Cardiovascular Disease

## 2017-06-12 ENCOUNTER — Ambulatory Visit (INDEPENDENT_AMBULATORY_CARE_PROVIDER_SITE_OTHER): Payer: Medicare Other | Admitting: Cardiovascular Disease

## 2017-06-12 VITALS — BP 94/61 | HR 80 | Ht 62.0 in | Wt 96.8 lb

## 2017-06-12 DIAGNOSIS — R636 Underweight: Secondary | ICD-10-CM | POA: Diagnosis not present

## 2017-06-12 DIAGNOSIS — I480 Paroxysmal atrial fibrillation: Secondary | ICD-10-CM

## 2017-06-12 DIAGNOSIS — Z7901 Long term (current) use of anticoagulants: Secondary | ICD-10-CM

## 2017-06-12 NOTE — Patient Instructions (Signed)
Dr Croitoru recommends that you schedule a follow-up appointment in 6 months. You will receive a reminder letter in the mail two months in advance. If you don't receive a letter, please call our office to schedule the follow-up appointment.  If you need a refill on your cardiac medications before your next appointment, please call your pharmacy. 

## 2017-07-21 DIAGNOSIS — R05 Cough: Secondary | ICD-10-CM | POA: Diagnosis not present

## 2017-07-21 DIAGNOSIS — H6990 Unspecified Eustachian tube disorder, unspecified ear: Secondary | ICD-10-CM | POA: Insufficient documentation

## 2017-07-21 DIAGNOSIS — H6502 Acute serous otitis media, left ear: Secondary | ICD-10-CM | POA: Diagnosis not present

## 2017-07-21 DIAGNOSIS — J029 Acute pharyngitis, unspecified: Secondary | ICD-10-CM | POA: Diagnosis not present

## 2017-07-21 DIAGNOSIS — J019 Acute sinusitis, unspecified: Secondary | ICD-10-CM | POA: Diagnosis not present

## 2017-07-21 DIAGNOSIS — J479 Bronchiectasis, uncomplicated: Secondary | ICD-10-CM | POA: Diagnosis not present

## 2017-07-23 DIAGNOSIS — J019 Acute sinusitis, unspecified: Secondary | ICD-10-CM | POA: Diagnosis not present

## 2017-07-23 DIAGNOSIS — J479 Bronchiectasis, uncomplicated: Secondary | ICD-10-CM | POA: Diagnosis not present

## 2017-07-23 DIAGNOSIS — R05 Cough: Secondary | ICD-10-CM | POA: Diagnosis not present

## 2017-07-25 ENCOUNTER — Encounter: Payer: Self-pay | Admitting: Family

## 2017-07-25 ENCOUNTER — Ambulatory Visit (INDEPENDENT_AMBULATORY_CARE_PROVIDER_SITE_OTHER): Payer: Medicare Other | Admitting: Family

## 2017-07-25 VITALS — BP 106/72 | HR 109 | Temp 98.2°F | Ht 62.0 in | Wt 98.0 lb

## 2017-07-25 DIAGNOSIS — R000 Tachycardia, unspecified: Secondary | ICD-10-CM | POA: Diagnosis not present

## 2017-07-25 DIAGNOSIS — I480 Paroxysmal atrial fibrillation: Secondary | ICD-10-CM

## 2017-07-25 DIAGNOSIS — J019 Acute sinusitis, unspecified: Secondary | ICD-10-CM | POA: Diagnosis not present

## 2017-07-25 MED ORDER — DOXYCYCLINE HYCLATE 100 MG PO TABS
100.0000 mg | ORAL_TABLET | Freq: Two times a day (BID) | ORAL | 0 refills | Status: DC
Start: 2017-07-25 — End: 2017-08-04

## 2017-07-25 NOTE — Progress Notes (Signed)
Monica [name] is a 72 y.o. female with the following history as recorded in EpicCare:  Patient Active Problem List   Diagnosis Date Noted  . Acute intractable headache 12/27/2016  . Cough 05/27/2016  . CAP (community acquired pneumonia) 05/13/2016  . Dizziness 01/28/2016  . Headache 09/29/2015  . PAF (paroxysmal atrial fibrillation) ([place]) 09/19/2015  . Allergic reaction 03/22/2015  . Mastoiditis of right side 03/13/2015  . Chest pain, atypical 04/13/2014  . Healthcare maintenance 02/16/2013  . Elevated CA-125 02/16/2013  . IBS (irritable bowel syndrome) 11/25/2010  . ANXIETY DISORDER, SITUATIONAL, MILD 10/20/2009  . ALLERGIC RHINITIS, SEASONAL 04/23/2007  . GERD 03/10/2007  . Insomnia disorder 01/26/2007  . HEARING LOSS, LEFT EAR 01/26/2007  . Sinusitis, chronic 01/26/2007  . Bronchiectasis without acute exacerbation ([place]) 01/26/2007  . TMJ SYNDROME 01/26/2007  . MENOPAUSAL DISORDER 01/26/2007  . OSTEOPOROSIS 01/26/2007  . PALPITATIONS 01/26/2007    Current Outpatient Medications  Medication Sig Dispense Refill  . acetaminophen (TYLENOL) 500 MG tablet Take 500 mg by mouth every 6 (six) hours as needed for moderate pain or headache.    . albuterol (PROAIR HFA) 108 (90 Base) MCG/ACT inhaler ProAir HFA 90 mcg/actuation aerosol inhaler  Inhale 2 puffs every 4 hours by inhalation route as needed.    . ALPRAZolam (XANAX) 0.5 MG tablet Take 1 tablet (0.5 mg total) by mouth once as needed for up to 1 dose for anxiety. 15 tablet 0  . CALCIUM-VITAMIN D PO Take 1 tablet by mouth daily.    . Cholecalciferol (VITAMIN D) 1000 UNITS capsule Take 1,000 Units by mouth daily.      Marland Kitchen dextromethorphan (DELSYM) 30 MG/5ML liquid Take 15-30 mLs 2 (two) times daily as needed by mouth for cough.    Marland Kitchen ELIQUIS 5 MG TABS tablet TAKE 1 TABLET TWICE DAILY. 60 tablet 0  . Estradiol 10 MCG TABS Place 10 mcg 2 (two) times a week vaginally. Pt does not have set days    . fluticasone (FLONASE) 50 MCG/ACT  nasal spray Place 2 sprays into both nostrils at bedtime as needed for allergies or rhinitis.    Marland Kitchen guaiFENesin (MUCINEX) 600 MG 12 hr tablet Take 600 mg 2 (two) times daily as needed by mouth for cough.    . Lactobacillus Rhamnosus, GG, (CULTURELLE PO) Take 1 tablet by mouth daily.    Marland Kitchen loratadine (CLARITIN) 10 MG tablet Take 10 mg daily as needed by mouth for allergies.     . metoprolol tartrate (LOPRESSOR) 25 MG tablet TAKE 1/2 TABLET DAILY WHEN NEEDED. 30 tablet 5  . Multiple Vitamins-Minerals (CENTRUM SILVER PO) Take 1 tablet by mouth daily.     Marland Kitchen omeprazole (PRILOSEC) 20 MG capsule Take 20 mg by mouth as needed.     Marland Kitchen oxymetazoline (NASAL SPRAY 12 HOUR) 0.05 % nasal spray Nasal Spray 12 Hour    . ranitidine (ZANTAC) 300 MG tablet Take 150 mg every evening by mouth.     . doxycycline (VIBRA-TABS) 100 MG tablet Take 1 tablet (100 mg total) by mouth 2 (two) times daily. 20 tablet 0   No current facility-administered medications for this visit.     Allergies: Amoxicillin-pot clavulanate; Avelox [moxifloxacin hcl in nacl]; Bactrim [sulfamethoxazole-trimethoprim]; Benzonatate; and Ciprofloxacin  Past Medical History:  Diagnosis Date  . Adenomatous colon polyp 10/2003  . Allergic rhinitis   . Allergy    SEASONAL  . Bronchiectasis   . Chronic sinusitis   . Diverticulosis   . GERD (gastroesophageal reflux disease)   .  Hearing loss   . Hemorrhoids   . Insomnia   . Menopausal disorder   . Osteoporosis   . Palpitations   . TMJ syndrome   . Urticaria     Past Surgical History:  Procedure Laterality Date  . COLONOSCOPY    . NASAL SINUS SURGERY    . TYMPANOSTOMY    . VIDEO BRONCHOSCOPY Bilateral 07/16/2016   Procedure: VIDEO BRONCHOSCOPY WITH FLUORO;  Surgeon: Collene Gobble, MD;  Location: [facility];  Service: Cardiopulmonary;  Laterality: Bilateral;    Family History  Problem Relation Age of Onset  . Rectal cancer Mother        died age 58  . Seizures Mother   . Colon cancer  Mother 64  . Cancer Mother   . Heart disease Father        dies age 89  . Hypertension Father   . Heart disease Maternal Grandfather   . AVM Brother   . Cancer Maternal Grandmother   . Cancer Paternal Uncle        multiple uncles with cancer    Social History   Tobacco Use  . Smoking status: Never Smoker  . Smokeless tobacco: Never Used  Substance Use Topics  . Alcohol use: Yes    Alcohol/week: 1.2 oz    Types: 2 Glasses of wine per week    Subjective:  Diagnosed with acute sinusitis/ bronchitis while on vacation last week in [place]; was started on Omnicef on 3/18 and prednisone 20 mg bid x 5 days; today she is on her 4th day of prednisone; also has albuterol inhaler- cannot tolerate repeatedly due to palpitations; worried this morning that her sinus infection seemed to be getting worse; has appt scheduled with her pulmonologist for Monday- wanted to do something different for the upcoming weekend until she could see them.     Objective:  Vitals:   07/25/17 1537  BP: 106/72  Pulse: (!) 109  Temp: 98.2 F (36.8 C)  TempSrc: Oral  SpO2: 97%  Weight: 98 lb 0.6 oz (44.5 kg)  Height: 5\' 2"  (1.575 m)    General: Well developed, well nourished, in no acute distress  Skin : Warm and dry.  Head: Normocephalic and atraumatic  Eyes: Sclera and conjunctiva clear; pupils round and reactive to light; extraocular movements intact  Ears: External normal; canals clear; tympanic membranes normal  Oropharynx: Pink, supple. No suspicious lesions  Neck: Supple without thyromegaly, adenopathy  Lungs: Respirations unlabored; clear to auscultation bilaterally without wheeze, rales, rhonchi  CVS exam: normal rate and regular rhythm.  Neurologic: Alert and oriented; speech intact; face symmetrical; moves all extremities well; CNII-XII intact without focal deficit  Assessment:  1. Acute sinusitis, recurrence not specified, unspecified location   2. Tachycardia   3. PAF (paroxysmal atrial  fibrillation) ([place])     Plan:  1. D/C Omnicef- change to Doxycycline 100 mg bid x 10 days; keep planned follow- up with her pulmonologist on Monday;  2. & 3. Patient notes she thinks she had an episode of A. Fib due to use of albuterol but has now resolved- exam unremarkable today; lower dosage of prednisone to 20 mg qd today, tomorrow and  Sunday; will let pulmonology determine if more steroids are needed at follow-up on Monday;   No follow-ups on file.  No orders of the defined types were placed in this encounter.   Requested Prescriptions   Signed Prescriptions Disp Refills  . doxycycline (VIBRA-TABS) 100 MG tablet 20 tablet 0  Sig: Take 1 tablet (100 mg total) by mouth 2 (two) times daily.

## 2017-07-28 ENCOUNTER — Ambulatory Visit (INDEPENDENT_AMBULATORY_CARE_PROVIDER_SITE_OTHER): Payer: Medicare Other | Admitting: Adult Health

## 2017-07-28 ENCOUNTER — Encounter: Payer: Self-pay | Admitting: Adult Health

## 2017-07-28 ENCOUNTER — Ambulatory Visit (INDEPENDENT_AMBULATORY_CARE_PROVIDER_SITE_OTHER)
Admission: RE | Admit: 2017-07-28 | Discharge: 2017-07-28 | Disposition: A | Discharge status: Home / Self Care | Payer: Medicare Other | Source: Ambulatory Visit | Attending: Adult Health | Admitting: Adult Health

## 2017-07-28 VITALS — BP 118/60 | HR 95 | Temp 97.6°F | Ht 62.0 in | Wt 96.6 lb

## 2017-07-28 DIAGNOSIS — J479 Bronchiectasis, uncomplicated: Secondary | ICD-10-CM

## 2017-07-28 DIAGNOSIS — J328 Other chronic sinusitis: Secondary | ICD-10-CM | POA: Diagnosis not present

## 2017-07-28 DIAGNOSIS — R05 Cough: Secondary | ICD-10-CM | POA: Diagnosis not present

## 2017-07-28 MED ORDER — PREDNISONE 5 MG PO TABS: ORAL_TABLET | ORAL | 0 refills | Status: DC

## 2017-07-28 NOTE — Progress Notes (Signed)
Called spoke with patient, advised of cxr results / recs as stated by TP.  Pt verbalized her understanding and denied any questions.  2 week follow up scheduled with TP on 4.8.19 @ 0930, pt aware to arrive early for cxr.

## 2017-07-28 NOTE — Patient Instructions (Signed)
Finish doxycycline as directed. Saline nasal rinses Prednisone 20 mg daily for 3 days then 10 mg daily for 3 days then 5 mg daily for 3 days and stop Continue on Flutter valve . Mucinex Twice daily  .As needed  Congestion  Delsym As needed  Cough  Push Fluids and rest  Tylenol As needed   Chest xray today  Follow up with Dr. Lamonte Sakai  In 6-8 weeks and .rpn  Please contact office for sooner follow up if symptoms do not improve or worsen or seek emergency care

## 2017-07-28 NOTE — Assessment & Plan Note (Signed)
Flare   Cont on current regimen   Plan  Patient Instructions  Finish doxycycline as directed. Saline nasal rinses Prednisone 20 mg daily for 3 days then 10 mg daily for 3 days then 5 mg daily for 3 days and stop Continue on Flutter valve . Mucinex Twice daily  .As needed  Congestion  Delsym As needed  Cough  Push Fluids and rest  Tylenol As needed   Chest xray today  Follow up with Dr. Lamonte Sakai  In 6-8 weeks and .rpn  Please contact office for sooner follow up if symptoms do not improve or worsen or seek emergency care

## 2017-07-28 NOTE — Progress Notes (Signed)
@Patient  ID: Monica [name], female    DOB: 05-11-1945, 72 y.o.   MRN: [id number]  Chief Complaint  Patient presents with  . Acute Visit    Bronchitis     Referring provider: Cassandria Anger, MD  HPI: 72 yo female never smoker followed for Bronchiectasis , AR, GERD and urticaria  Hx of A Fib on Eliquis    TEST bronchoscopy in 2003 - cx's negative HRCT Chest 05/2016 >bronchiectasis increased mid to lower lobes, increased micronodularity /macronodularitiy/GG attenuation. Mucoid impaction . ?MAI  3/2018bronchoscopy that negative except for pansensitive pseudomonas. AFB neg .  Methacholine challenge on 09/13/16. Neg  spirometry was normal -10/2016   07/28/2017 Acute OV : Bronchiectasis  Patient presents for an acute office visit.  She complains over the last week that she is had increased cough congestion sinus pain and pressure with discolored discharge from her nose.  Patient was on vacation and started on [place] but did not have any significant improvement.  She was seen on March 22 by her primary care office and started on a 10-day course of doxycycline.  She also was started on prednisone taper and is currently on 20 mg.  Last dose was yesterday.  She says she has had slight improvement in symptoms but continues to have nasal congestion cough and thick mucus.  She has general fatigue and malaise.  No nausea vomiting diarrhea fever chest pain orthopnea PND or leg swelling.    Allergies  Allergen Reactions  . Amoxicillin-Pot Clavulanate Diarrhea    Has patient had a PCN reaction causing immediate rash, facial/tongue/throat swelling, SOB or lightheadedness with hypotension:No Has patient had a PCN reaction causing severe rash involving mucus membranes or skin necrosis:No Has patient had a PCN reaction that required hospitalization:No Has patient had a PCN reaction occurring within the last 10 years:Yes If all of the above answers are "NO", then may proceed with  Cephalosporin use  . Avelox [Moxifloxacin Hcl In Nacl] Nausea Only  . Bactrim [Sulfamethoxazole-Trimethoprim] Other (See Comments)    flushing  . Benzonatate Other (See Comments)    felt bad, out of sorts, disoriented.  . Ciprofloxacin Rash    Rash across abdomen    Immunization History  Administered Date(s) Administered  . Influenza Split 02/25/2011, 02/05/2012  . Influenza Whole 03/02/2008, 03/02/2009, 02/06/2010  . Influenza, High Dose Seasonal PF 01/28/2017  . Influenza,inj,Quad PF,6+ Mos 02/04/2013, 01/24/2014, 02/02/2015, 02/01/2016  . Pneumococcal Conjugate-13 02/16/2013  . Pneumococcal Polysaccharide-23 03/11/2003, 01/16/2009, 09/26/2014  . Td 06/06/2001  . Tdap 07/17/2012  . Zoster 05/07/2007    Past Medical History:  Diagnosis Date  . Adenomatous colon polyp 10/2003  . Allergic rhinitis   . Allergy    SEASONAL  . Bronchiectasis   . Chronic sinusitis   . Diverticulosis   . GERD (gastroesophageal reflux disease)   . Hearing loss   . Hemorrhoids   . Insomnia   . Menopausal disorder   . Osteoporosis   . Palpitations   . TMJ syndrome   . Urticaria     Tobacco History: Social History   Tobacco Use  Smoking Status Never Smoker  Smokeless Tobacco Never Used   Counseling given: Not Answered   Outpatient Encounter Medications as of 07/28/2017  Medication Sig  . acetaminophen (TYLENOL) 500 MG tablet Take 500 mg by mouth every 6 (six) hours as needed for moderate pain or headache.  . albuterol (PROAIR HFA) 108 (90 Base) MCG/ACT inhaler ProAir HFA 90 mcg/actuation aerosol inhaler  Inhale 2  puffs every 4 hours by inhalation route as needed.  . ALPRAZolam (XANAX) 0.5 MG tablet Take 1 tablet (0.5 mg total) by mouth once as needed for up to 1 dose for anxiety.  Marland Kitchen CALCIUM-VITAMIN D PO Take 1 tablet by mouth daily.  . Cholecalciferol (VITAMIN D) 1000 UNITS capsule Take 1,000 Units by mouth daily.    Marland Kitchen dextromethorphan (DELSYM) 30 MG/5ML liquid Take 15-30 mLs 2 (two)  times daily as needed by mouth for cough.  . doxycycline (VIBRA-TABS) 100 MG tablet Take 1 tablet (100 mg total) by mouth 2 (two) times daily.  Marland Kitchen ELIQUIS 5 MG TABS tablet TAKE 1 TABLET TWICE DAILY.  Marland Kitchen Estradiol 10 MCG TABS Place 10 mcg 2 (two) times a week vaginally. Pt does not have set days  . fluticasone (FLONASE) 50 MCG/ACT nasal spray Place 2 sprays into both nostrils at bedtime as needed for allergies or rhinitis.  Marland Kitchen guaiFENesin (MUCINEX) 600 MG 12 hr tablet Take 600 mg 2 (two) times daily as needed by mouth for cough.  . Lactobacillus Rhamnosus, GG, (CULTURELLE PO) Take 1 tablet by mouth daily.  Marland Kitchen loratadine (CLARITIN) 10 MG tablet Take 10 mg daily as needed by mouth for allergies.   . metoprolol tartrate (LOPRESSOR) 25 MG tablet TAKE 1/2 TABLET DAILY WHEN NEEDED.  . Multiple Vitamins-Minerals (CENTRUM SILVER PO) Take 1 tablet by mouth daily.   Marland Kitchen omeprazole (PRILOSEC) 20 MG capsule Take 20 mg by mouth as needed.   Marland Kitchen oxymetazoline (NASAL SPRAY 12 HOUR) 0.05 % nasal spray Nasal Spray 12 Hour  . ranitidine (ZANTAC) 300 MG tablet Take 150 mg every evening by mouth.   . predniSONE (DELTASONE) 5 MG tablet 20 mg daily for 3 days then 10 mg daily for 3 days then 5 mg daily for 3 days and stop   No facility-administered encounter medications on file as of 07/28/2017.      Review of Systems  Constitutional:   No  weight loss, night sweats,  Fevers, chills,  +fatigue, or  lassitude.  HEENT:   No headaches,  Difficulty swallowing,  Tooth/dental problems, or  Sore throat,                No sneezing, itching, ear ache,  +nasal congestion, post nasal drip,   CV:  No chest pain,  Orthopnea, PND, swelling in lower extremities, anasarca, dizziness, palpitations, syncope.   GI  No heartburn, indigestion, abdominal pain, nausea, vomiting, diarrhea, change in bowel habits, loss of appetite, bloody stools.   Resp:    No chest wall deformity  Skin: no rash or lesions.  GU: no dysuria, change in  color of urine, no urgency or frequency.  No flank pain, no hematuria   MS:  No joint pain or swelling.  No decreased range of motion.  No back pain.    Physical Exam  BP 118/60 (BP Location: Left Arm, Cuff Size: Normal)   Pulse 95   Temp 97.6 F (36.4 C) (Oral)   Ht 5\' 2"  (1.575 m)   Wt 96 lb 9.6 oz (43.8 kg)   SpO2 98%   BMI 17.67 kg/m   GEN: A/Ox3; pleasant , NAD, thin and elderly    HEENT:  [place]/AT,  EACs-clear, TMs-wnl, NOSE-clear, THROAT-clear, no lesions, no postnasal drip or exudate noted.   NECK:  Supple w/ fair ROM; no JVD; normal carotid impulses w/o bruits; no thyromegaly or nodules palpated; no lymphadenopathy.    RESP  Clear  P & A; w/o, wheezes/ rales/  or rhonchi. no accessory muscle use, no dullness to percussion  CARD:  RRR, no m/r/g, no peripheral edema, pulses intact, no cyanosis or clubbing.  GI:   Soft & nt; nml bowel sounds; no organomegaly or masses detected.   Musco: Warm bil, no deformities or joint swelling noted.   Neuro: alert, no focal deficits noted.    Skin: Warm, no lesions or rashes    Lab Results:  CBC    Component Value Date/Time   WBC 11.5 (H) 03/24/2017 1018   RBC 4.14 03/24/2017 1018   HGB 12.7 03/24/2017 1018   HGB 13.0 09/25/2016 1017   HCT 37.9 03/24/2017 1018   HCT 37.7 09/25/2016 1017   PLT 747.0 (H) 03/24/2017 1018   PLT 411 (H) 09/25/2016 1017   MCV 91.5 03/24/2017 1018   MCV 88 09/25/2016 1017   MCH 30.8 03/14/2017 0424   MCHC 33.5 03/24/2017 1018   RDW 13.5 03/24/2017 1018   RDW 12.8 09/25/2016 1017   LYMPHSABS 1.4 03/24/2017 1018   LYMPHSABS 1.5 09/25/2016 1017   MONOABS 0.7 03/24/2017 1018   EOSABS 0.1 03/24/2017 1018   EOSABS 0.2 09/25/2016 1017   BASOSABS 0.1 03/24/2017 1018   BASOSABS 0.0 09/25/2016 1017    BMET    Component Value Date/Time   NA 137 03/14/2017 0424   K 3.6 03/14/2017 0424   CL 102 03/14/2017 0424   CO2 26 03/14/2017 0424   GLUCOSE 131 (H) 03/14/2017 0424   BUN 10 03/14/2017  0424   CREATININE 0.70 03/14/2017 0424   CALCIUM 8.8 (L) 03/14/2017 0424   GFRNONAA >60 03/14/2017 0424   GFRAA >60 03/14/2017 0424    BNP No results found for: BNP  ProBNP No results found for: PROBNP  Imaging: Dg Chest 2 View  Result Date: 07/28/2017 CLINICAL DATA:  Persistent cough and chest congestion for the past 10 days. History of bronchiectasis, paroxysmal atrial fibrillation, nonsmoker. EXAM: CHEST - 2 VIEW COMPARISON:  PA and lateral chest x-ray of March 24, 2017 FINDINGS: The lungs are mildly hyperinflated. The interstitial markings are coarse in the lower lobes bilaterally. These are slightly more conspicuous on the left today. The heart and pulmonary vascularity are normal. The mediastinum is normal in width. The bony thorax exhibits no acute abnormality. IMPRESSION: Chronic bronchitic changes, as bibasilar scarring. Patchy density in the right lower lung worrisome for pneumonia. Followup PA and lateral chest X-ray is recommended in 3-4 weeks following trial of antibiotic therapy to ensure resolution and exclude underlying malignancy. Electronically Signed   By: David  Martinique M.D.   On: 07/28/2017 13:19     Assessment & Plan:   Bronchiectasis without acute exacerbation (HCC) Flare , slow to resolve exacerbation  Check cxr .  Will cont on Doxycycline , if not improving would check sputum cx as prev psuedomonas infection (cipro /avelox intolerant )   Plan  Patient Instructions  Finish doxycycline as directed. Saline nasal rinses Prednisone 20 mg daily for 3 days then 10 mg daily for 3 days then 5 mg daily for 3 days and stop Continue on Flutter valve . Mucinex Twice daily  .As needed  Congestion  Delsym As needed  Cough  Push Fluids and rest  Tylenol As needed   Chest xray today  Follow up with Dr. Lamonte Sakai  In 6-8 weeks and .rpn  Please contact office for sooner follow up if symptoms do not improve or worsen or seek emergency care        Sinusitis,  chronic  Flare   Cont on current regimen   Plan  Patient Instructions  Finish doxycycline as directed. Saline nasal rinses Prednisone 20 mg daily for 3 days then 10 mg daily for 3 days then 5 mg daily for 3 days and stop Continue on Flutter valve . Mucinex Twice daily  .As needed  Congestion  Delsym As needed  Cough  Push Fluids and rest  Tylenol As needed   Chest xray today  Follow up with Dr. Lamonte Sakai  In 6-8 weeks and .rpn  Please contact office for sooner follow up if symptoms do not improve or worsen or seek emergency care          Rexene Edison, NP 07/28/2017

## 2017-07-28 NOTE — Assessment & Plan Note (Signed)
Flare , slow to resolve exacerbation  Check cxr .  Will cont on Doxycycline , if not improving would check sputum cx as prev psuedomonas infection (cipro /avelox intolerant )   Plan  Patient Instructions  Finish doxycycline as directed. Saline nasal rinses Prednisone 20 mg daily for 3 days then 10 mg daily for 3 days then 5 mg daily for 3 days and stop Continue on Flutter valve . Mucinex Twice daily  .As needed  Congestion  Delsym As needed  Cough  Push Fluids and rest  Tylenol As needed   Chest xray today  Follow up with Dr. Lamonte Sakai  In 6-8 weeks and .rpn  Please contact office for sooner follow up if symptoms do not improve or worsen or seek emergency care

## 2017-08-04 ENCOUNTER — Ambulatory Visit (INDEPENDENT_AMBULATORY_CARE_PROVIDER_SITE_OTHER): Payer: Medicare Other | Admitting: Adult Health

## 2017-08-04 ENCOUNTER — Ambulatory Visit (INDEPENDENT_AMBULATORY_CARE_PROVIDER_SITE_OTHER)
Admission: RE | Admit: 2017-08-04 | Discharge: 2017-08-04 | Disposition: A | Discharge status: Home / Self Care | Payer: Medicare Other | Source: Ambulatory Visit | Attending: Adult Health | Admitting: Adult Health

## 2017-08-04 ENCOUNTER — Encounter: Payer: Self-pay | Admitting: Adult Health

## 2017-08-04 VITALS — BP 112/64 | HR 75 | Temp 97.5°F | Ht 61.5 in | Wt 98.2 lb

## 2017-08-04 DIAGNOSIS — J479 Bronchiectasis, uncomplicated: Secondary | ICD-10-CM | POA: Diagnosis not present

## 2017-08-04 DIAGNOSIS — J181 Lobar pneumonia, unspecified organism: Secondary | ICD-10-CM

## 2017-08-04 DIAGNOSIS — R05 Cough: Secondary | ICD-10-CM | POA: Diagnosis not present

## 2017-08-04 DIAGNOSIS — J301 Allergic rhinitis due to pollen: Secondary | ICD-10-CM

## 2017-08-04 DIAGNOSIS — J189 Pneumonia, unspecified organism: Secondary | ICD-10-CM

## 2017-08-04 NOTE — Assessment & Plan Note (Signed)
Restart claritin and flonase

## 2017-08-04 NOTE — Assessment & Plan Note (Signed)
Clinically imnproving  Hold on additional abx for now  Check cxr .

## 2017-08-04 NOTE — Assessment & Plan Note (Signed)
Recent flare with associated right lower lobe pneumonia clinically improving Patient is to complete her antibiotics today as discussed with a 10-day course of doxycycline.  She will finish prednisone this week.  Plan  Patient Instructions  Follow-up chest x-ray today Finish prednisone as directed Saline nasal rinses Continue on Flutter valve . Mucinex Twice daily  .As needed  Congestion  Delsym As needed  Cough  Push Fluids and rest  Tylenol As needed   Chest xray today  Restart Flonase and Claritin. Follow up with Dr. Lamonte Sakai  In 2 months and as needed Please contact office for sooner follow up if symptoms do not improve or worsen or seek emergency care

## 2017-08-04 NOTE — Patient Instructions (Signed)
Follow-up chest x-ray today Finish prednisone as directed Saline nasal rinses Continue on Flutter valve . Mucinex Twice daily  .As needed  Congestion  Delsym As needed  Cough  Push Fluids and rest  Tylenol As needed   Chest xray today  Restart Flonase and Claritin. Follow up with Dr. Lamonte Sakai  In 2 months and as needed Please contact office for sooner follow up if symptoms do not improve or worsen or seek emergency care

## 2017-08-04 NOTE — Progress Notes (Signed)
@Patient  ID: Monica [name], female    DOB: 12-28-1945, 72 y.o.   MRN: [id number]  Chief Complaint  Patient presents with  . Follow-up    Bronchiectasis     Referring provider: Cassandria Anger, MD  HPI: 72 yo female never smoker followed for Bronchiectasis , AR, GERD and urticaria  Hx of A Fib on Eliquis    TEST bronchoscopy in 2003 - cx's negative HRCT Chest 05/2016 >bronchiectasis increased mid to lower lobes, increased micronodularity /macronodularitiy/GG attenuation. Mucoid impaction . ?MAI 3/2018bronchoscopy that negative except for pansensitive pseudomonas.AFB neg . Methacholine challenge on 09/13/16. Neg  spirometry was normal-10/2016   08/04/2017 Follow up : Bronchiectasis and PNA  Patient returns for a one-week follow-up.  Patient was seen last visit for an acute office visit.  She was having lingering cough congestion and sinus drainage.  She was treated with a 10-day course of doxycycline.  She was also started on a prednisone taper.  Chest x-ray showed patchy consolidation along the right lower lobe consistent with a pneumonia.  Patient was continued on antibiotics and prednisone.  Patient returns today says that she is feeling better with decreased cough and congestion still has some lingering sinus drainage.  She is worried that she took her antibiotics along with a nutritional vitamin drink.  Reassured her that this is okay. She denies any chest pain orthopnea PND or increased leg swelling.   Allergies  Allergen Reactions  . Albuterol Other (See Comments)    Patient states put her into atrial fib last time she took this medication  . Amoxicillin-Pot Clavulanate Diarrhea    Has patient had a PCN reaction causing immediate rash, facial/tongue/throat swelling, SOB or lightheadedness with hypotension:No Has patient had a PCN reaction causing severe rash involving mucus membranes or skin necrosis:No Has patient had a PCN reaction that required  hospitalization:No Has patient had a PCN reaction occurring within the last 10 years:Yes If all of the above answers are "NO", then may proceed with Cephalosporin use  . Avelox [Moxifloxacin Hcl In Nacl] Nausea Only  . Bactrim [Sulfamethoxazole-Trimethoprim] Other (See Comments)    flushing  . Benzonatate Other (See Comments)    felt bad, out of sorts, disoriented.  . Ciprofloxacin Rash    Rash across abdomen    Immunization History  Administered Date(s) Administered  . Influenza Split 02/25/2011, 02/05/2012  . Influenza Whole 03/02/2008, 03/02/2009, 02/06/2010  . Influenza, High Dose Seasonal PF 01/28/2017  . Influenza,inj,Quad PF,6+ Mos 02/04/2013, 01/24/2014, 02/02/2015, 02/01/2016  . Pneumococcal Conjugate-13 02/16/2013  . Pneumococcal Polysaccharide-23 03/11/2003, 01/16/2009, 09/26/2014  . Td 06/06/2001  . Tdap 07/17/2012  . Zoster 05/07/2007    Past Medical History:  Diagnosis Date  . Adenomatous colon polyp 10/2003  . Allergic rhinitis   . Allergy    SEASONAL  . Bronchiectasis   . Chronic sinusitis   . Diverticulosis   . GERD (gastroesophageal reflux disease)   . Hearing loss   . Hemorrhoids   . Insomnia   . Menopausal disorder   . Osteoporosis   . Palpitations   . TMJ syndrome   . Urticaria     Tobacco History: Social History   Tobacco Use  Smoking Status Never Smoker  Smokeless Tobacco Never Used   Counseling given: Not Answered   Outpatient Encounter Medications as of 08/04/2017  Medication Sig  . acetaminophen (TYLENOL) 500 MG tablet Take 500 mg by mouth every 6 (six) hours as needed for moderate pain or headache.  . albuterol (PROAIR  HFA) 108 (90 Base) MCG/ACT inhaler ProAir HFA 90 mcg/actuation aerosol inhaler  Inhale 2 puffs every 4 hours by inhalation route as needed.  . ALPRAZolam (XANAX) 0.5 MG tablet Take 1 tablet (0.5 mg total) by mouth once as needed for up to 1 dose for anxiety.  Marland Kitchen CALCIUM-VITAMIN D PO Take 1 tablet by mouth daily.  .  Cholecalciferol (VITAMIN D) 1000 UNITS capsule Take 1,000 Units by mouth daily.    Marland Kitchen dextromethorphan (DELSYM) 30 MG/5ML liquid Take 15-30 mLs 2 (two) times daily as needed by mouth for cough.  Marland Kitchen ELIQUIS 5 MG TABS tablet TAKE 1 TABLET TWICE DAILY.  Marland Kitchen Estradiol 10 MCG TABS Place 10 mcg 2 (two) times a week vaginally. Pt does not have set days  . fluticasone (FLONASE) 50 MCG/ACT nasal spray Place 2 sprays into both nostrils at bedtime as needed for allergies or rhinitis.  Marland Kitchen guaiFENesin (MUCINEX) 600 MG 12 hr tablet Take 600 mg 2 (two) times daily as needed by mouth for cough.  . Lactobacillus Rhamnosus, GG, (CULTURELLE PO) Take 1 tablet by mouth daily.  Marland Kitchen loratadine (CLARITIN) 10 MG tablet Take 10 mg daily as needed by mouth for allergies.   . metoprolol tartrate (LOPRESSOR) 25 MG tablet TAKE 1/2 TABLET DAILY WHEN NEEDED.  . Multiple Vitamins-Minerals (CENTRUM SILVER PO) Take 1 tablet by mouth daily.   Marland Kitchen omeprazole (PRILOSEC) 20 MG capsule Take 20 mg by mouth as needed.   Marland Kitchen oxymetazoline (NASAL SPRAY 12 HOUR) 0.05 % nasal spray Nasal Spray 12 Hour  . predniSONE (DELTASONE) 5 MG tablet 20 mg daily for 3 days then 10 mg daily for 3 days then 5 mg daily for 3 days and stop  . ranitidine (ZANTAC) 300 MG tablet Take 150 mg every evening by mouth.   . [DISCONTINUED] doxycycline (VIBRA-TABS) 100 MG tablet Take 1 tablet (100 mg total) by mouth 2 (two) times daily. (Patient not taking: Reported on 08/04/2017)   No facility-administered encounter medications on file as of 08/04/2017.      Review of Systems  Constitutional:   No  weight loss, night sweats,  Fevers, chills, fatigue, or  lassitude.  HEENT:   No headaches,  Difficulty swallowing,  Tooth/dental problems, or  Sore throat,                No sneezing, itching, ear ache,  +nasal congestion, post nasal drip,   CV:  No chest pain,  Orthopnea, PND, swelling in lower extremities, anasarca, dizziness, palpitations, syncope.   GI  No heartburn,  indigestion, abdominal pain, nausea, vomiting, diarrhea, change in bowel habits, loss of appetite, bloody stools.   Resp:   No chest wall deformity  Skin: no rash or lesions.  GU: no dysuria, change in color of urine, no urgency or frequency.  No flank pain, no hematuria   MS:  No joint pain or swelling.  No decreased range of motion.  No back pain.    Physical Exam  BP 112/64 (BP Location: Left Arm, Cuff Size: Normal)   Pulse 75   Temp (!) 97.5 F (36.4 C) (Oral)   Ht 5' 1.5" (1.562 m)   Wt 98 lb 3.2 oz (44.5 kg)   SpO2 99%   BMI 18.25 kg/m   GEN: A/Ox3; pleasant , NAD, thin elderly female    HEENT:  /AT,  EACs-clear, TMs-wnl, NOSE-clear drainage , THROAT-clear, no lesions, no postnasal drip or exudate noted.   NECK:  Supple w/ fair ROM; no JVD; normal carotid  impulses w/o bruits; no thyromegaly or nodules palpated; no lymphadenopathy.    RESP  Clear  P & A; w/o, wheezes/ rales/ or rhonchi. no accessory muscle use, no dullness to percussion  CARD:  RRR, no m/r/g, no peripheral edema, pulses intact, no cyanosis or clubbing.  GI:   Soft & nt; nml bowel sounds; no organomegaly or masses detected.   Musco: Warm bil, no deformities or joint swelling noted.   Neuro: alert, no focal deficits noted.    Skin: Warm, no lesions or rashes    Lab Results:  CBC  BMET   BNP No results found for: BNP  ProBNP No results found for: PROBNP  Imaging: Dg Chest 2 View  Result Date: 07/28/2017 CLINICAL DATA:  Persistent cough and chest congestion for the past 10 days. History of bronchiectasis, paroxysmal atrial fibrillation, nonsmoker. EXAM: CHEST - 2 VIEW COMPARISON:  PA and lateral chest x-ray of March 24, 2017 FINDINGS: The lungs are mildly hyperinflated. The interstitial markings are coarse in the lower lobes bilaterally. These are slightly more conspicuous on the left today. The heart and pulmonary vascularity are normal. The mediastinum is normal in width. The bony  thorax exhibits no acute abnormality. IMPRESSION: Chronic bronchitic changes, as bibasilar scarring. Patchy density in the right lower lung worrisome for pneumonia. Followup PA and lateral chest X-ray is recommended in 3-4 weeks following trial of antibiotic therapy to ensure resolution and exclude underlying malignancy. Electronically Signed   By: David  Martinique M.D.   On: 07/28/2017 13:19     Assessment & Plan:   Bronchiectasis without acute exacerbation (HCC) Recent flare with associated right lower lobe pneumonia clinically improving Patient is to complete her antibiotics today as discussed with a 10-day course of doxycycline.  She will finish prednisone this week.  Plan  Patient Instructions  Follow-up chest x-ray today Finish prednisone as directed Saline nasal rinses Continue on Flutter valve . Mucinex Twice daily  .As needed  Congestion  Delsym As needed  Cough  Push Fluids and rest  Tylenol As needed   Chest xray today  Restart Flonase and Claritin. Follow up with Dr. Lamonte Sakai  In 2 months and as needed Please contact office for sooner follow up if symptoms do not improve or worsen or seek emergency care        CAP (community acquired pneumonia) Clinically imnproving  Hold on additional abx for now  Check cxr .   ALLERGIC RHINITIS, SEASONAL Restart claritin and flonase      Rexene Edison, NP 08/04/2017

## 2017-08-05 NOTE — Progress Notes (Signed)
Called spoke with patient, advised of cxr results / recs as stated by TP.  Pt verbalized her understanding and denied any questions. 

## 2017-08-11 ENCOUNTER — Ambulatory Visit: Payer: Medicare Other | Admitting: Adult Health

## 2017-08-13 ENCOUNTER — Encounter: Payer: Self-pay | Admitting: Internal Medicine

## 2017-08-13 ENCOUNTER — Ambulatory Visit (INDEPENDENT_AMBULATORY_CARE_PROVIDER_SITE_OTHER): Payer: Medicare Other | Admitting: Internal Medicine

## 2017-08-13 DIAGNOSIS — J181 Lobar pneumonia, unspecified organism: Secondary | ICD-10-CM | POA: Diagnosis not present

## 2017-08-13 DIAGNOSIS — R059 Cough, unspecified: Secondary | ICD-10-CM

## 2017-08-13 DIAGNOSIS — I480 Paroxysmal atrial fibrillation: Secondary | ICD-10-CM

## 2017-08-13 DIAGNOSIS — R05 Cough: Secondary | ICD-10-CM

## 2017-08-13 DIAGNOSIS — J189 Pneumonia, unspecified organism: Secondary | ICD-10-CM | POA: Diagnosis not present

## 2017-08-13 MED ORDER — PROMETHAZINE-CODEINE 6.25-10 MG/5ML PO SYRP: 5.0000 mL | ORAL_SOLUTION | ORAL | 0 refills | Status: DC | PRN

## 2017-08-13 MED ORDER — UMECLIDINIUM BROMIDE 62.5 MCG/INH IN AEPB: 1.0000 | INHALATION_SPRAY | Freq: Every day | RESPIRATORY_TRACT | 3 refills | Status: DC

## 2017-08-13 NOTE — Assessment & Plan Note (Signed)
D/c Albuterol

## 2017-08-13 NOTE — Assessment & Plan Note (Signed)
Prom-cod syr prn

## 2017-08-13 NOTE — Assessment & Plan Note (Signed)
CXR was ok Anoro Prom-cod

## 2017-08-13 NOTE — Progress Notes (Signed)
Subjective:  Patient ID: Monica [name], female    DOB: [birth year]  Age: 72 y.o. MRN: [id number]  CC: No chief complaint on file.   HPI Monica [name] presents for URI - got sick in [place]: took [place], Prednisone, Doxy - CXR w/possible pneumonia. CXR -ok. C/o A fib  Outpatient Medications Prior to Visit  Medication Sig Dispense Refill  . acetaminophen (TYLENOL) 500 MG tablet Take 500 mg by mouth every 6 (six) hours as needed for moderate pain or headache.    . ALPRAZolam (XANAX) 0.5 MG tablet Take 1 tablet (0.5 mg total) by mouth once as needed for up to 1 dose for anxiety. 15 tablet 0  . CALCIUM-VITAMIN D PO Take 1 tablet by mouth daily.    . Cholecalciferol (VITAMIN D) 1000 UNITS capsule Take 1,000 Units by mouth daily.      Marland Kitchen dextromethorphan (DELSYM) 30 MG/5ML liquid Take 15-30 mLs 2 (two) times daily as needed by mouth for cough.    Marland Kitchen ELIQUIS 5 MG TABS tablet TAKE 1 TABLET TWICE DAILY. 60 tablet 0  . Estradiol 10 MCG TABS Place 10 mcg 2 (two) times a week vaginally. Pt does not have set days    . fluticasone (FLONASE) 50 MCG/ACT nasal spray Place 2 sprays into both nostrils at bedtime as needed for allergies or rhinitis.    Marland Kitchen guaiFENesin (MUCINEX) 600 MG 12 hr tablet Take 600 mg 2 (two) times daily as needed by mouth for cough.    . Lactobacillus Rhamnosus, GG, (CULTURELLE PO) Take 1 tablet by mouth daily.    Marland Kitchen loratadine (CLARITIN) 10 MG tablet Take 10 mg daily as needed by mouth for allergies.     . metoprolol tartrate (LOPRESSOR) 25 MG tablet TAKE 1/2 TABLET DAILY WHEN NEEDED. 30 tablet 5  . Multiple Vitamins-Minerals (CENTRUM SILVER PO) Take 1 tablet by mouth daily.     Marland Kitchen omeprazole (PRILOSEC) 20 MG capsule Take 20 mg by mouth as needed.     Marland Kitchen oxymetazoline (NASAL SPRAY 12 HOUR) 0.05 % nasal spray Nasal Spray 12 Hour    . ranitidine (ZANTAC) 300 MG tablet Take 150 mg every evening by mouth.     Marland Kitchen albuterol (PROAIR HFA) 108 (90 Base) MCG/ACT inhaler ProAir HFA 90  mcg/actuation aerosol inhaler  Inhale 2 puffs every 4 hours by inhalation route as needed.    . predniSONE (DELTASONE) 5 MG tablet 20 mg daily for 3 days then 10 mg daily for 3 days then 5 mg daily for 3 days and stop 21 tablet 0   No facility-administered medications prior to visit.     ROS Review of Systems  Constitutional: Negative for activity change, appetite change, chills, fatigue and unexpected weight change.  HENT: Negative for congestion, mouth sores and sinus pressure.   Eyes: Negative for visual disturbance.  Respiratory: Positive for cough. Negative for chest tightness.   Gastrointestinal: Negative for abdominal pain and nausea.  Genitourinary: Negative for difficulty urinating, frequency and vaginal pain.  Musculoskeletal: Negative for back pain and gait problem.  Skin: Negative for pallor and rash.  Neurological: Negative for dizziness, tremors, weakness, numbness and headaches.  Psychiatric/Behavioral: Negative for confusion and sleep disturbance.    Objective:  BP 116/72 (BP Location: Right Arm, Patient Position: Sitting, Cuff Size: Normal)   Pulse 94   Temp 98 F (36.7 C) (Oral)   Ht 5' 1.5" (1.562 m)   Wt 98 lb (44.5 kg)   SpO2 99%   BMI 18.22 kg/m  BP Readings from Last 3 Encounters:  08/13/17 116/72  08/04/17 112/64  07/28/17 118/60    Wt Readings from Last 3 Encounters:  08/13/17 98 lb (44.5 kg)  08/04/17 98 lb 3.2 oz (44.5 kg)  07/28/17 96 lb 9.6 oz (43.8 kg)    Physical Exam  Constitutional: She appears well-developed. No distress.  HENT:  Head: Normocephalic.  Right Ear: External ear normal.  Left Ear: External ear normal.  Nose: Nose normal.  Mouth/Throat: Oropharynx is clear and moist.  Eyes: Pupils are equal, round, and reactive to light. Conjunctivae are normal. Right eye exhibits no discharge. Left eye exhibits no discharge.  Neck: Normal range of motion. Neck supple. No JVD present. No tracheal deviation present. No thyromegaly  present.  Cardiovascular: Normal rate, regular rhythm and normal heart sounds.  Pulmonary/Chest: No stridor. No respiratory distress. She has no wheezes.  Abdominal: Soft. Bowel sounds are normal. She exhibits no distension and no mass. There is no tenderness. There is no rebound and no guarding.  Musculoskeletal: She exhibits no edema or tenderness.  Lymphadenopathy:    She has no cervical adenopathy.  Neurological: She displays normal reflexes. No cranial nerve deficit. She exhibits normal muscle tone. Coordination normal.  Skin: No rash noted. No erythema.  Psychiatric: She has a normal mood and affect. Her behavior is normal. Judgment and thought content normal.   I personally provided Incruse inhaler use teaching. After the teaching patient was able to demonstrate it's use effectively. All questions were answered  Lab Results  Component Value Date   WBC 11.5 (H) 03/24/2017   HGB 12.7 03/24/2017   HCT 37.9 03/24/2017   PLT 747.0 (H) 03/24/2017   GLUCOSE 131 (H) 03/14/2017   CHOL 157 04/13/2014   TRIG 71.0 04/13/2014   HDL 65.60 04/13/2014   LDLCALC 77 04/13/2014   ALT 17 09/28/2014   AST 28 09/28/2014   NA 137 03/14/2017   K 3.6 03/14/2017   CL 102 03/14/2017   CREATININE 0.70 03/14/2017   BUN 10 03/14/2017   CO2 26 03/14/2017   TSH 4.381 09/17/2015    Dg Chest 2 View  Result Date: 08/04/2017 CLINICAL DATA:  72 year old female with persistent cough. Chronic bronchiectasis and mucoid impaction. EXAM: CHEST - 2 VIEW COMPARISON:  07/28/2017 and earlier, including high-resolution chest CT 05/31/2016. FINDINGS: Chronic large lung volumes with increased AP dimension to the chest. Chronic lower lobe and right middle lobe predominant bronchiectasis demonstrated on the 2018 CT. No pneumothorax, pulmonary edema or pleural effusion. Middle and lower lobe irregular peribronchial opacity appears not significantly changed since November 2018. Similar apical scarring greater on the right. No  acute pulmonary opacity identified. No acute osseous abnormality identified. Negative visible bowel gas pattern. IMPRESSION: Chronic lung disease including hyperinflation and Bronchiectasis with no superimposed acute findings identified. Electronically Signed   By: Genevie Ann M.D.   On: 08/04/2017 11:26    Assessment & Plan:   There are no diagnoses linked to this encounter. I have discontinued Waynard Reeds. Oland's albuterol and predniSONE. I am also having her maintain her Vitamin D, Estradiol, Multiple Vitamins-Minerals (CENTRUM SILVER PO), (Lactobacillus Rhamnosus, GG, (CULTURELLE PO)), acetaminophen, loratadine, CALCIUM-VITAMIN D PO, fluticasone, ranitidine, metoprolol tartrate, ELIQUIS, guaiFENesin, dextromethorphan, omeprazole, ALPRAZolam, and oxymetazoline.  No orders of the defined types were placed in this encounter.    Follow-up: No follow-ups on file.  Walker Kehr, MD

## 2017-08-19 DIAGNOSIS — M25571 Pain in right ankle and joints of right foot: Secondary | ICD-10-CM | POA: Diagnosis not present

## 2017-08-19 DIAGNOSIS — M25561 Pain in right knee: Secondary | ICD-10-CM | POA: Diagnosis not present

## 2017-08-21 ENCOUNTER — Ambulatory Visit (INDEPENDENT_AMBULATORY_CARE_PROVIDER_SITE_OTHER): Payer: Medicare Other | Admitting: Family Medicine

## 2017-08-21 ENCOUNTER — Encounter: Payer: Self-pay | Admitting: Family Medicine

## 2017-08-21 VITALS — BP 108/62 | HR 83 | Temp 98.2°F | Ht 61.5 in | Wt 98.0 lb

## 2017-08-21 DIAGNOSIS — G44319 Acute post-traumatic headache, not intractable: Secondary | ICD-10-CM | POA: Diagnosis not present

## 2017-08-21 DIAGNOSIS — J321 Chronic frontal sinusitis: Secondary | ICD-10-CM | POA: Diagnosis not present

## 2017-08-21 NOTE — Progress Notes (Signed)
Monica [name] - 72 y.o. female MRN [id number]  Date of birth: [birth year]  SUBJECTIVE:  Including CC & ROS.  Chief Complaint  Patient presents with  . Sinus Problem  . Head Injury    Monica [name] is a 72 y.o. female that is presenting with sinus pain. Ongoing for one month. Denies fever or body aches. Denies drainage. She completed a course of doxycycline and omincef for same symptoms last month. She feels like her symptoms have not improved.  She bumped her head in the shower a couple of days. She is still having some aches near the back of her head. Denies loss of consciousness or dizziness. Denies any bumps or knots.      Review of Systems  Constitutional: Negative for fever.  HENT: Positive for congestion.   Respiratory: Negative for cough.   Neurological: Positive for headaches.    HISTORY: Past Medical, Surgical, Social, and Family History Reviewed & Updated per EMR.   Pertinent Historical Findings include:  Past Medical History:  Diagnosis Date  . Adenomatous colon polyp 10/2003  . Allergic rhinitis   . Allergy    SEASONAL  . Bronchiectasis   . Chronic sinusitis   . Diverticulosis   . GERD (gastroesophageal reflux disease)   . Hearing loss   . Hemorrhoids   . Insomnia   . Menopausal disorder   . Osteoporosis   . Palpitations   . TMJ syndrome   . Urticaria     Past Surgical History:  Procedure Laterality Date  . COLONOSCOPY    . NASAL SINUS SURGERY    . TYMPANOSTOMY    . VIDEO BRONCHOSCOPY Bilateral 07/16/2016   Procedure: VIDEO BRONCHOSCOPY WITH FLUORO;  Surgeon: Collene Gobble, MD;  Location: [facility];  Service: Cardiopulmonary;  Laterality: Bilateral;    Allergies  Allergen Reactions  . Albuterol Other (See Comments)    Patient states put her into atrial fib last time she took this medication  . Amoxicillin-Pot Clavulanate Diarrhea    Has patient had a PCN reaction causing immediate rash, facial/tongue/throat swelling, SOB or lightheadedness  with hypotension:No Has patient had a PCN reaction causing severe rash involving mucus membranes or skin necrosis:No Has patient had a PCN reaction that required hospitalization:No Has patient had a PCN reaction occurring within the last 10 years:Yes If all of the above answers are "NO", then may proceed with Cephalosporin use  . Avelox [Moxifloxacin Hcl In Nacl] Nausea Only  . Bactrim [Sulfamethoxazole-Trimethoprim] Other (See Comments)    flushing  . Benzonatate Other (See Comments)    felt bad, out of sorts, disoriented.  . Ciprofloxacin Rash    Rash across abdomen    Family History  Problem Relation Age of Onset  . Rectal cancer Mother        died age 60  . Seizures Mother   . Colon cancer Mother 62  . Cancer Mother   . Heart disease Father        dies age 50  . Hypertension Father   . Heart disease Maternal Grandfather   . AVM Brother   . Cancer Maternal Grandmother   . Cancer Paternal Uncle        multiple uncles with cancer     Social History   Socioeconomic History  . Marital status: Married    Spouse name: [name]  . Number of children: 0  . Years of education: BSN  . Highest education level: Not on file  Occupational History  . Occupation:  retired Therapist, sports  Social Needs  . Financial resource strain: Not on file  . Food insecurity:    Worry: Not on file    Inability: Not on file  . Transportation needs:    Medical: Not on file    Non-medical: Not on file  Tobacco Use  . Smoking status: Never Smoker  . Smokeless tobacco: Never Used  Substance and Sexual Activity  . Alcohol use: Yes    Alcohol/week: 1.2 oz    Types: 2 Glasses of wine per week  . Drug use: No  . Sexual activity: Not on file  Lifestyle  . Physical activity:    Days per week: Not on file    Minutes per session: Not on file  . Stress: Not on file  Relationships  . Social connections:    Talks on phone: Not on file    Gets together: Not on file    Attends religious service: Not on file     Active member of club or organization: Not on file    Attends meetings of clubs or organizations: Not on file    Relationship status: Not on file  . Intimate partner violence:    Fear of current or ex partner: Not on file    Emotionally abused: Not on file    Physically abused: Not on file    Forced sexual activity: Not on file  Other Topics Concern  . Not on file  Social History Narrative   Lives with husband   Caffeine use: 1 cup tea per day     PHYSICAL EXAM:  VS: BP 108/62 (BP Location: Left Arm, Patient Position: Sitting, Cuff Size: Normal)   Pulse 83   Temp 98.2 F (36.8 C) (Oral)   Ht 5' 1.5" (1.562 m)   Wt 98 lb (44.5 kg)   SpO2 99%   BMI 18.22 kg/m  Physical Exam Gen: NAD, alert, cooperative with exam,  ENT: normal lips, normal nasal mucosa, tympanic membranes clear and intact bilaterally, normal oropharynx,  Eye: normal EOM, normal conjunctiva and lids CV:  no edema, +2 pedal pulses, regular rate and rhythm, S1-S2   Resp: no accessory muscle use, non-labored, clear to auscultation bilaterally, no crackles or wheezes Skin: no rashes, no areas of induration  Neuro: normal tone, normal sensation to touch, cranial nerves II through XII intact Psych:  normal insight, alert and oriented MSK: Normal gait, normal strength       ASSESSMENT & PLAN:   Headache Had a contusion to her head while in the shower.  Did not fall and did not have any bleeding.  Is worried about intracranial bleeding with being on anticoagulation.  Neurological defects today -Reassurance provided -Provided indications to seek immediate care  Sinusitis, chronic Reports symptoms are still frontal in nature. -Advised to follow-up with her ear nose and throat doctor.

## 2017-08-21 NOTE — Patient Instructions (Signed)
I would try be seen by your Ear nose and throat doctor Please follow up or seek immediate care if you have any neurological deficits.

## 2017-08-22 NOTE — Assessment & Plan Note (Signed)
Reports symptoms are still frontal in nature. -Advised to follow-up with her ear nose and throat doctor.

## 2017-08-22 NOTE — Assessment & Plan Note (Signed)
Had a contusion to her head while in the shower.  Did not fall and did not have any bleeding.  Is worried about intracranial bleeding with being on anticoagulation.  Neurological defects today -Reassurance provided -Provided indications to seek immediate care

## 2017-08-26 DIAGNOSIS — J309 Allergic rhinitis, unspecified: Secondary | ICD-10-CM | POA: Diagnosis not present

## 2017-08-26 DIAGNOSIS — H6121 Impacted cerumen, right ear: Secondary | ICD-10-CM | POA: Diagnosis not present

## 2017-08-26 DIAGNOSIS — H6981 Other specified disorders of Eustachian tube, right ear: Secondary | ICD-10-CM | POA: Diagnosis not present

## 2017-08-26 DIAGNOSIS — H903 Sensorineural hearing loss, bilateral: Secondary | ICD-10-CM | POA: Diagnosis not present

## 2017-09-15 ENCOUNTER — Ambulatory Visit (INDEPENDENT_AMBULATORY_CARE_PROVIDER_SITE_OTHER): Payer: Medicare Other | Admitting: Emergency Medicine

## 2017-09-15 ENCOUNTER — Encounter: Payer: Self-pay | Admitting: Emergency Medicine

## 2017-09-15 DIAGNOSIS — J479 Bronchiectasis, uncomplicated: Secondary | ICD-10-CM | POA: Diagnosis not present

## 2017-09-15 DIAGNOSIS — R05 Cough: Secondary | ICD-10-CM | POA: Diagnosis not present

## 2017-09-15 DIAGNOSIS — R059 Cough, unspecified: Secondary | ICD-10-CM

## 2017-09-15 DIAGNOSIS — J0141 Acute recurrent pansinusitis: Secondary | ICD-10-CM | POA: Diagnosis not present

## 2017-09-15 DIAGNOSIS — J301 Allergic rhinitis due to pollen: Secondary | ICD-10-CM | POA: Diagnosis not present

## 2017-09-15 DIAGNOSIS — K219 Gastro-esophageal reflux disease without esophagitis: Secondary | ICD-10-CM

## 2017-09-15 DIAGNOSIS — J302 Other seasonal allergic rhinitis: Secondary | ICD-10-CM | POA: Diagnosis not present

## 2017-09-15 NOTE — Progress Notes (Signed)
Subjective:    Patient ID: Monica [name], female    DOB: 10-11-1945, 72 y.o.   MRN: [id number]  HPI [name] is a 72 year old never smoker with a history of allergic rhinitis, GERD,urticaria, atrial fibrillation. She was followed in our office by Dr. Joya Gaskins for bronchiectasis, FOB cx-negative 2003.  She has been dealing with recurrent episodic flares of cough and bronchiectasis.  Repeat bronchoscopy 07/16/16 was negative with the exception of a pansensitive Pseudomonas.  She had a methacholine challenge 09/13/16 that did not show hyper responsiveness, normal spirometry.  Also had a reassuring allergy skin testing.  Most recent CT scan of the chest 05/31/16.  Since last visit she developed and he was treated for left lower lobe pneumonia following an URI, difficulty w the abx - didn't take final doxycycline due to concerns about GI sx and med interactions, but she did take augmentin, received ceftriaxone as well. She is having abd pain, no diarrhea. Her cough and breathing are better. Biggest issue right now is her GI sx and anxiety  ROV 09/15/17 --72 year old woman with a history of allergic rhinitis, GERD and atrial fibrillation, IBS, anxiety disorder.  We have followed her for bronchiectasis.  She was treated for a flare with a probable associated pneumonia in late March after a URI +/- sinusitis.  Repeat chest x-ray done 08/04/2017 showed chronic changes but no discrete infiltrate. She is planning to see Dr Wilburn Cornelia regarding her cough and UA. Minimal mucous production right now. She has some residual cough that is now dry, consistent with UA irritation. She was tried on Incruse by Dr Alain Marion - note her PFT do not support. She is on loratadine, flonase, saline nasal spray. She does not have any overt GERD. She takes zantac bid.                                Review of Systems As per history of present illness     Objective:   Physical Exam Vitals:   09/15/17 1029  BP: 112/62  Pulse: 94  SpO2: 99%  Weight: 99 lb (44.9 kg)  Height: 5' 1.75" (1.568 m)   'Gen: Pleasant, thin woman, well-appearing, in no distress,  normal affect  ENT: No lesions,  mouth clear,  oropharynx clear, no postnasal drip  Neck: No JVD, no TMG, no carotid bruits  Lungs: No use of accessory muscles, clear without rales or rhonchi, no wheeze   Cardiovascular: RRR, heart sounds normal, no murmur or gallops, no peripheral edema  Musculoskeletal: No deformities, no cyanosis or clubbing  Neuro: alert, non focal  Skin: Warm, no lesions or rashes  CT chest 05/31/16 --  COMPARISON:  Chest CT 01/10/2016.  FINDINGS: Cardiovascular: Heart size is normal. There is no significant pericardial fluid, thickening or pericardial calcification. Atherosclerotic calcifications in the thoracic aorta (mild). No calcifications are identified in the coronary arteries.  Mediastinum/Nodes: No pathologically enlarged mediastinal or hilar lymph nodes. Please note that accurate exclusion of hilar adenopathy is limited on noncontrast CT scans. Esophagus is unremarkable in appearance. No axillary lymphadenopathy.  Lungs/Pleura: High-resolution images demonstrate widespread areas of cylindrical and varicose bronchiectasis, most evident throughout the mid to lower lungs bilaterally. The areas of greatest involvement demonstrate extensive thickening of the peribronchovascular interstitium with extensive peribronchovascular micro and macronodularity, associated peribronchovascular ground-glass attenuation and regional areas of architectural distortion, all of which have increased compared to prior study 01/10/2016. Relative sparing of the upper lungs.  Inspiratory and expiratory imaging is unremarkable. No confluent consolidative airspace disease.  No pleural effusions.  Upper Abdomen: Unremarkable.  Musculoskeletal: There are no aggressive appearing lytic or blastic lesions noted in the visualized portions of the skeleton.  IMPRESSION: 1. 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 2. Aortic atherosclerosis.       Assessment & Plan:  Bronchiectasis without acute exacerbation ([place]) Improved from a true bronchiectasis flare that seem to come from an upper respiratory infection in late March 2019.  Minimal sputum production at this time.  Her most recent chest x-ray was stable.  She does not have any evidence to support obstructive lung disease and we do not have her on any bronchodilators at this time.  We will talk about the timing of her repeat imaging at her next visit.  ALLERGIC RHINITIS, SEASONAL  Continue loratadine, Flonase nasal spray, saline nasal spray as you have been using them. Agree with meeting Dr. Wilburn Cornelia with ENT to reexamine your upper airway.  GERD A contributor to her upper airway irritation syndrome.  She is currently on Zantac twice a day, his try to avoid PPIs if possible given potential side effects.  Cough Her cough at this time appears to be principally upper airway in nature.  We talked about ways to prevent upper airway irritation, throat clearing, etc.  Attempting to control GERD and rhinitis as aggressively as possible.  I do not believe that her bronchiectasis is active right now.  Baltazar Apo, MD, PhD 09/15/2017, 11:00 AM [facility] [phone] or if no answer [phone]

## 2017-09-15 NOTE — Assessment & Plan Note (Signed)
Her cough at this time appears to be principally upper airway in nature.  We talked about ways to prevent upper airway irritation, throat clearing, etc.  Attempting to control GERD and rhinitis as aggressively as possible.  I do not believe that her bronchiectasis is active right now.

## 2017-09-15 NOTE — Assessment & Plan Note (Signed)
  Continue loratadine, Flonase nasal spray, saline nasal spray as you have been using them. Agree with meeting Dr. Wilburn Cornelia with ENT to reexamine your upper airway.

## 2017-09-15 NOTE — Assessment & Plan Note (Signed)
Improved from a true bronchiectasis flare that seem to come from an upper respiratory infection in late March 2019.  Minimal sputum production at this time.  Her most recent chest x-ray was stable.  She does not have any evidence to support obstructive lung disease and we do not have her on any bronchodilators at this time.  We will talk about the timing of her repeat imaging at her next visit.

## 2017-09-15 NOTE — Patient Instructions (Addendum)
Please continue Zantac twice a day as you have been taking it Continue loratadine, Flonase nasal spray, saline nasal spray as you have been using them. We will not start any inhaled medications at this time. Agree with meeting Dr. Wilburn Cornelia with ENT to reexamine your upper airway. Your bronchiectasis flare appears to have improved. We will talk about the timing of repeat imaging at your next visit Follow with Dr Lamonte Sakai in 6 months or sooner if you have any problems

## 2017-09-15 NOTE — Assessment & Plan Note (Signed)
A contributor to her upper airway irritation syndrome.  She is currently on Zantac twice a day, his try to avoid PPIs if possible given potential side effects.

## 2017-09-22 ENCOUNTER — Other Ambulatory Visit: Payer: Self-pay | Admitting: Cardiovascular Disease

## 2017-09-24 ENCOUNTER — Ambulatory Visit (INDEPENDENT_AMBULATORY_CARE_PROVIDER_SITE_OTHER): Payer: Medicare Other | Admitting: *Deleted

## 2017-09-24 VITALS — BP 108/62 | HR 78 | Resp 18 | Ht 62.0 in | Wt 100.0 lb

## 2017-09-24 DIAGNOSIS — Z0000 Encounter for general adult medical examination without abnormal findings: Secondary | ICD-10-CM | POA: Diagnosis not present

## 2017-09-24 NOTE — Patient Instructions (Addendum)
Continue doing brain stimulating activities (puzzles, reading, adult coloring books, staying active) to keep memory sharp.   Continue to eat heart healthy diet (full of fruits, vegetables, whole grains, lean protein, water--limit salt, fat, and sugar intake) and increase physical activity as tolerated.   Monica [name] , Thank you for taking time to come for your Medicare Wellness Visit. I appreciate your ongoing commitment to your health goals. Please review the following plan we discussed and let me know if I can assist you in the future.   These are the goals we discussed: Goals    . Patient Stated     I want to take time for me and have a spa day.        This is a list of the screening recommended for you and due dates:  Health Maintenance  Topic Date Due  . Flu Shot  12/04/2017  . Mammogram  05/28/2018  . Colon Cancer Screening  01/15/2019  . Tetanus Vaccine  07/18/2022  . DEXA scan (bone density measurement)  Completed  .  Hepatitis C: One time screening is recommended by Center for Disease Control  (CDC) for  adults born from [birth year] through 1965.   Completed  . Pneumonia vaccines  Completed   Health Maintenance, Female Adopting a healthy lifestyle and getting preventive care can go a long way to promote health and wellness. Talk with your health care provider about what schedule of regular examinations is right for you. This is a good chance for you to check in with your provider about disease prevention and staying healthy. In between checkups, there are plenty of things you can do on your own. Experts have done a lot of research about which lifestyle changes and preventive measures are most likely to keep you healthy. Ask your health care provider for more information. Weight and diet Eat a healthy diet  Be sure to include plenty of vegetables, fruits, low-fat dairy products, and lean protein.  Do not eat a lot of foods high in solid fats, added sugars, or salt.  Get regular  exercise. This is one of the most important things you can do for your health. ? Most adults should exercise for at least 150 minutes each week. The exercise should increase your heart rate and make you sweat (moderate-intensity exercise). ? Most adults should also do strengthening exercises at least twice a week. This is in addition to the moderate-intensity exercise.  Maintain a healthy weight  Body mass index (BMI) is a measurement that can be used to identify possible weight problems. It estimates body fat based on height and weight. Your health care provider can help determine your BMI and help you achieve or maintain a healthy weight.  For females 49 years of age and older: ? A BMI below 18.5 is considered underweight. ? A BMI of 18.5 to 24.9 is normal. ? A BMI of 25 to 29.9 is considered overweight. ? A BMI of 30 and above is considered obese.  Watch levels of cholesterol and blood lipids  You should start having your blood tested for lipids and cholesterol at 73 years of age, then have this test every 5 years.  You may need to have your cholesterol levels checked more often if: ? Your lipid or cholesterol levels are high. ? You are older than 73 years of age. ? You are at high risk for heart disease.  Cancer screening Lung Cancer  Lung cancer screening is recommended for adults 88-63 years old  who are at high risk for lung cancer because of a history of smoking.  A yearly low-dose CT scan of the lungs is recommended for people who: ? Currently smoke. ? Have quit within the past 15 years. ? Have at least a 30-pack-year history of smoking. A pack year is smoking an average of one pack of cigarettes a day for 1 year.  Yearly screening should continue until it has been 15 years since you quit.  Yearly screening should stop if you develop a health problem that would prevent you from having lung cancer treatment.  Breast Cancer  Practice breast self-awareness. This means  understanding how your breasts normally appear and feel.  It also means doing regular breast self-exams. Let your health care provider know about any changes, no matter how small.  If you are in your 20s or 30s, you should have a clinical breast exam (CBE) by a health care provider every 1-3 years as part of a regular health exam.  If you are 32 or older, have a CBE every year. Also consider having a breast X-ray (mammogram) every year.  If you have a family history of breast cancer, talk to your health care provider about genetic screening.  If you are at high risk for breast cancer, talk to your health care provider about having an MRI and a mammogram every year.  Breast cancer gene (BRCA) assessment is recommended for women who have family members with BRCA-related cancers. BRCA-related cancers include: ? Breast. ? Ovarian. ? Tubal. ? Peritoneal cancers.  Results of the assessment will determine the need for genetic counseling and BRCA1 and BRCA2 testing.  Cervical Cancer Your health care provider may recommend that you be screened regularly for cancer of the pelvic organs (ovaries, uterus, and vagina). This screening involves a pelvic examination, including checking for microscopic changes to the surface of your cervix (Pap test). You may be encouraged to have this screening done every 3 years, beginning at age 37.  For women ages 70-65, health care providers may recommend pelvic exams and Pap testing every 3 years, or they may recommend the Pap and pelvic exam, combined with testing for human papilloma virus (HPV), every 5 years. Some types of HPV increase your risk of cervical cancer. Testing for HPV may also be done on women of any age with unclear Pap test results.  Other health care providers may not recommend any screening for nonpregnant women who are considered low risk for pelvic cancer and who do not have symptoms. Ask your health care provider if a screening pelvic exam is  right for you.  If you have had past treatment for cervical cancer or a condition that could lead to cancer, you need Pap tests and screening for cancer for at least 20 years after your treatment. If Pap tests have been discontinued, your risk factors (such as having a new sexual partner) need to be reassessed to determine if screening should resume. Some women have medical problems that increase the chance of getting cervical cancer. In these cases, your health care provider may recommend more frequent screening and Pap tests.  Colorectal Cancer  This type of cancer can be detected and often prevented.  Routine colorectal cancer screening usually begins at 73 years of age and continues through 73 years of age.  Your health care provider may recommend screening at an earlier age if you have risk factors for colon cancer.  Your health care provider may also recommend using home test kits  to check for hidden blood in the stool.  A small camera at the end of a tube can be used to examine your colon directly (sigmoidoscopy or colonoscopy). This is done to check for the earliest forms of colorectal cancer.  Routine screening usually begins at age 58.  Direct examination of the colon should be repeated every 5-10 years through 73 years of age. However, you may need to be screened more often if early forms of precancerous polyps or small growths are found.  Skin Cancer  Check your skin from head to toe regularly.  Tell your health care provider about any new moles or changes in moles, especially if there is a change in a mole's shape or color.  Also tell your health care provider if you have a mole that is larger than the size of a pencil eraser.  Always use sunscreen. Apply sunscreen liberally and repeatedly throughout the day.  Protect yourself by wearing long sleeves, pants, a wide-brimmed hat, and sunglasses whenever you are outside.  Heart disease, diabetes, and high blood  pressure  High blood pressure causes heart disease and increases the risk of stroke. High blood pressure is more likely to develop in: ? People who have blood pressure in the high end of the normal range (130-139/85-89 mm Hg). ? People who are overweight or obese. ? People who are African American.  If you are 71-18 years of age, have your blood pressure checked every 3-5 years. If you are 44 years of age or older, have your blood pressure checked every year. You should have your blood pressure measured twice-once when you are at a hospital or clinic, and once when you are not at a hospital or clinic. Record the average of the two measurements. To check your blood pressure when you are not at a hospital or clinic, you can use: ? An automated blood pressure machine at a pharmacy. ? A home blood pressure monitor.  If you are between 60 years and 93 years old, ask your health care provider if you should take aspirin to prevent strokes.  Have regular diabetes screenings. This involves taking a blood sample to check your fasting blood sugar level. ? If you are at a normal weight and have a low risk for diabetes, have this test once every three years after 73 years of age. ? If you are overweight and have a high risk for diabetes, consider being tested at a younger age or more often. Preventing infection Hepatitis B  If you have a higher risk for hepatitis B, you should be screened for this virus. You are considered at high risk for hepatitis B if: ? You were born in a country where hepatitis B is common. Ask your health care provider which countries are considered high risk. ? Your parents were born in a high-risk country, and you have not been immunized against hepatitis B (hepatitis B vaccine). ? You have HIV or AIDS. ? You use needles to inject street drugs. ? You live with someone who has hepatitis B. ? You have had sex with someone who has hepatitis B. ? You get hemodialysis  treatment. ? You take certain medicines for conditions, including cancer, organ transplantation, and autoimmune conditions.  Hepatitis C  Blood testing is recommended for: ? Everyone born from [birth year] through 1965. ? Anyone with known risk factors for hepatitis C.  Sexually transmitted infections (STIs)  You should be screened for sexually transmitted infections (STIs) including gonorrhea and chlamydia if: ?  You are sexually active and are younger than 73 years of age. ? You are older than 73 years of age and your health care provider tells you that you are at risk for this type of infection. ? Your sexual activity has changed since you were last screened and you are at an increased risk for chlamydia or gonorrhea. Ask your health care provider if you are at risk.  If you do not have HIV, but are at risk, it may be recommended that you take a prescription medicine daily to prevent HIV infection. This is called pre-exposure prophylaxis (PrEP). You are considered at risk if: ? You are sexually active and do not regularly use condoms or know the HIV status of your partner(s). ? You take drugs by injection. ? You are sexually active with a partner who has HIV.  Talk with your health care provider about whether you are at high risk of being infected with HIV. If you choose to begin PrEP, you should first be tested for HIV. You should then be tested every 3 months for as long as you are taking PrEP. Pregnancy  If you are premenopausal and you may become pregnant, ask your health care provider about preconception counseling.  If you may become pregnant, take 400 to 800 micrograms (mcg) of folic acid every day.  If you want to prevent pregnancy, talk to your health care provider about birth control (contraception). Osteoporosis and menopause  Osteoporosis is a disease in which the bones lose minerals and strength with aging. This can result in serious bone fractures. Your risk for osteoporosis  can be identified using a bone density scan.  If you are 68 years of age or older, or if you are at risk for osteoporosis and fractures, ask your health care provider if you should be screened.  Ask your health care provider whether you should take a calcium or vitamin D supplement to lower your risk for osteoporosis.  Menopause may have certain physical symptoms and risks.  Hormone replacement therapy may reduce some of these symptoms and risks. Talk to your health care provider about whether hormone replacement therapy is right for you. Follow these instructions at home:  Schedule regular health, dental, and eye exams.  Stay current with your immunizations.  Do not use any tobacco products including cigarettes, chewing tobacco, or electronic cigarettes.  If you are pregnant, do not drink alcohol.  If you are breastfeeding, limit how much and how often you drink alcohol.  Limit alcohol intake to no more than 1 drink per day for nonpregnant women. One drink equals 12 ounces of beer, 5 ounces of wine, or 1 ounces of hard liquor.  Do not use street drugs.  Do not share needles.  Ask your health care provider for help if you need support or information about quitting drugs.  Tell your health care provider if you often feel depressed.  Tell your health care provider if you have ever been abused or do not feel safe at home. This information is not intended to replace advice given to you by your health care provider. Make sure you discuss any questions you have with your health care provider. Document Released: 11/05/2010 Document Revised: 09/28/2015 Document Reviewed: 01/24/2015 Elsevier Interactive Patient Education  [employer].

## 2017-09-24 NOTE — Progress Notes (Addendum)
 Subjective:   Monica [name] is a 71 y.o. female who presents for Medicare Annual (Subsequent) preventive examination.  Review of Systems:  No ROS.  Medicare Wellness Visit. Additional risk factors are reflected in the social history.  Cardiac Risk Factors include: advanced age (>55men, >65 women) Sleep patterns: has restless sleep, has frequent nighttime awakenings, gets up 1 times nightly to void and sleeps 4-5 hours nightly. Patient reports insomnia issues, discussed recommended sleep tips and stress reduction tips, education was attached to patient's AVS.  Home Safety/Smoke Alarms: Feels safe in home. Smoke alarms in place.  Living environment; residence and Firearm Safety: 2-story house, no firearms., Lives with husband, no needs for DME, good support system Seat Belt Safety/Bike Helmet: Wears seat belt.     Objective:     Vitals: BP 108/62   Pulse 78   Resp 18   Ht 5' 2" (1.575 m)   Wt 100 lb (45.4 kg)   SpO2 98%   BMI 18.29 kg/m   Body mass index is 18.29 kg/m.  Advanced Directives 09/24/2017 03/14/2017 07/16/2016 09/17/2015 07/16/2015 09/28/2014 09/26/2014  Does Patient Have a Medical Advance Directive? Yes No Yes No No Yes Yes  Type of Advance Directive Healthcare Power of Attorney;Living will - Living will - - Healthcare Power of Attorney Healthcare Power of Attorney;Living will  Does patient want to make changes to medical advance directive? - - - - - - -  Copy of Healthcare Power of Attorney in Chart? No - copy requested - - - - No - copy requested -  Would patient like information on creating a medical advance directive? - No - Patient declined - No - patient declined information No - patient declined information - -    Tobacco Social History   Tobacco Use  Smoking Status Never Smoker  Smokeless Tobacco Never Used     Counseling given: Not Answered  Past Medical History:  Diagnosis Date  . Adenomatous colon polyp 10/2003  . Allergic rhinitis   . Allergy    SEASONAL  . Bronchiectasis   . Chronic sinusitis   . Diverticulosis   . GERD (gastroesophageal reflux disease)   . Hearing loss   . Hemorrhoids   . Insomnia   . Menopausal disorder   . Osteoporosis   . Palpitations   . TMJ syndrome   . Urticaria    Past Surgical History:  Procedure Laterality Date  . COLONOSCOPY    . NASAL SINUS SURGERY    . TYMPANOSTOMY    . VIDEO BRONCHOSCOPY Bilateral 07/16/2016   Procedure: VIDEO BRONCHOSCOPY WITH FLUORO;  Surgeon: Robert S [name], MD;  Location: [facility];  Service: Cardiopulmonary;  Laterality: Bilateral;   Family History  Problem Relation Age of Onset  . Rectal cancer Mother        died age 58  . Seizures Mother   . Colon cancer Mother 54  . Cancer Mother   . Heart disease Father        dies age 95  . Hypertension Father   . Heart disease Maternal Grandfather   . AVM Brother   . Cancer Maternal Grandmother   . Cancer Paternal Uncle        multiple uncles with cancer   Social History   Socioeconomic History  . Marital status: Married    Spouse name: Bob  . Number of children: 0  . Years of education: BSN  . Highest education level: Not on file  Occupational History  .   Occupation: retired Animal nutritionist  . Financial resource strain: Not hard at all  . Food insecurity:    Worry: Never true    Inability: Never true  . Transportation needs:    Medical: No    Non-medical: No  Tobacco Use  . Smoking status: Never Smoker  . Smokeless tobacco: Never Used  Substance and Sexual Activity  . Alcohol use: Yes    Alcohol/week: 1.2 oz    Types: 2 Glasses of Monica [name] per week  . Drug use: No  . Sexual activity: Not Currently  Lifestyle  . Physical activity:    Days per week: 3 days    Minutes per session: 60 min  . Stress: Rather much  Relationships  . Social connections:    Talks on phone: More than three times a week    Gets together: More than three times a week    Attends religious service: More than 4 times per year     Active member of club or organization: Not on file    Attends meetings of clubs or organizations: More than 4 times per year    Relationship status: Married  Other Topics Concern  . Not on file  Social History Narrative   Lives with husband   Caffeine use: 1 cup tea per day    Outpatient Encounter Medications as of 09/24/2017  Medication Sig  . acetaminophen (TYLENOL) 500 MG tablet Take 500 mg by mouth every 6 (six) hours as needed for moderate pain or headache.  . ALPRAZolam (XANAX) 0.5 MG tablet Take 1 tablet (0.5 mg total) by mouth once as needed for up to 1 dose for anxiety.  Marland Kitchen CALCIUM-VITAMIN D PO Take 1 tablet by mouth daily.  . Cholecalciferol (VITAMIN D) 1000 UNITS capsule Take 1,000 Units by mouth daily.    Marland Kitchen dextromethorphan (DELSYM) 30 MG/5ML liquid Take 15-30 mLs 2 (two) times daily as needed by mouth for cough.  Marland Kitchen ELIQUIS 5 MG TABS tablet TAKE 1 TABLET TWICE DAILY.  Marland Kitchen Estradiol 10 MCG TABS Place 10 mcg 2 (two) times a week vaginally. Pt does not have set days  . fluticasone (FLONASE) 50 MCG/ACT nasal spray Place 2 sprays into both nostrils at bedtime as needed for allergies or rhinitis.  . Lactobacillus Rhamnosus, GG, (CULTURELLE PO) Take 1 tablet by mouth daily.  Marland Kitchen loratadine (CLARITIN) 10 MG tablet Take 10 mg daily as needed by mouth for allergies.   . metoprolol tartrate (LOPRESSOR) 25 MG tablet TAKE 1/2 TABLET DAILY WHEN NEEDED.  . Multiple Vitamins-Minerals (CENTRUM SILVER PO) Take 1 tablet by mouth daily.   Marland Kitchen omeprazole (PRILOSEC) 20 MG capsule Take 20 mg by mouth as needed.   Marland Kitchen oxymetazoline (NASAL SPRAY 12 HOUR) 0.05 % nasal spray Nasal Spray 12 Hour  . ranitidine (ZANTAC) 300 MG tablet Take 150 mg every evening by mouth.   . [DISCONTINUED] ELIQUIS 5 MG TABS tablet TAKE 1 TABLET TWICE A DAY (Patient not taking: Reported on 09/24/2017)  . [DISCONTINUED] promethazine-codeine (PHENERGAN WITH CODEINE) 6.25-10 MG/5ML syrup Take 5 mLs by mouth every 4 (four) hours as  needed. (Patient not taking: Reported on 09/24/2017)   No facility-administered encounter medications on file as of 09/24/2017.     Activities of Daily Living In your present state of health, do you have any difficulty performing the following activities: 09/24/2017  Hearing? N  Vision? N  Difficulty concentrating or making decisions? N  Walking or climbing stairs? N  Dressing or bathing? N  Doing errands, shopping? N  Preparing Food and eating ? N  Using the Toilet? N  In the past six months, have you accidently leaked urine? N  Do you have problems with loss of bowel control? N  Managing your Medications? N  Managing your Finances? N  Housekeeping or managing your Housekeeping? N  Some recent data might be hidden    Patient Care Team: Plotnikov, Aleksei V, MD as PCP - General (Internal Medicine) Mody, Vaishali, MD as Consulting Physician (Obstetrics and Gynecology) Clarke-Pearson, Daniel, MD as Attending Physician (Gynecology) Teoh, Su, MD as Consulting Physician (Otolaryngology) Gruber, Hope, MD as Consulting Physician (Dermatology) Stark, Malcolm T, MD as Consulting Physician (Gastroenterology) [name], Robert S, MD as Consulting Physician (Pulmonary Disease) Kraus, Eric, MD as Consulting Physician (Otolaryngology)    Assessment:   This is a routine wellness examination for Merrit. Physical assessment deferred to PCP.   Exercise Activities and Dietary recommendations Current Exercise Habits: Structured exercise class, Type of exercise: walking(Tai Chi classes), Time (Minutes): 50, Frequency (Times/Week): 3, Weekly Exercise (Minutes/Week): 150, Intensity: Mild, Exercise limited by: None identified  Diet (meal preparation, eat out, water intake, caffeinated beverages, dairy products, fruits and vegetables): in general, a "healthy" diet  , well balanced, eats a variety of fruits and vegetables daily, limits salt, fat/cholesterol, sugar,carbohydrates,caffeine, drinks 6-8 glasses of  water daily.   Goals    . Patient Stated     I want to take time for me and have a spa day.        Fall Risk Fall Risk  09/24/2017 08/13/2017 11/25/2016 04/04/2015 03/13/2015  Falls in the past year? Yes No No No No  Comment - - Emmi Telephone Survey: data to providers prior to load - -  Number falls in past yr: 1 - - - -  Injury with Fall? No - - - -    Depression Screen PHQ 2/9 Scores 09/24/2017 08/13/2017 04/04/2015 03/13/2015  PHQ - 2 Score 1 0 0 0  PHQ- 9 Score 5 - - -     Cognitive Function       Ad8 score reviewed for issues:  Issues making decisions: no  Less interest in hobbies / activities: no  Repeats questions, stories (family complaining): no  Trouble using ordinary gadgets (microwave, computer, phone):no  Forgets the month or year: no  Mismanaging finances: no  Remembering appts: no  Daily problems with thinking and/or memory: no Ad8 score is= 0  Immunization History  Administered Date(s) Administered  . Influenza Split 02/25/2011, 02/05/2012  . Influenza Whole 03/02/2008, 03/02/2009, 02/06/2010  . Influenza, High Dose Seasonal PF 01/28/2017  . Influenza,inj,Quad PF,6+ Mos 02/04/2013, 01/24/2014, 02/02/2015, 02/01/2016  . Influenza-Unspecified 01/04/2017  . Pneumococcal Conjugate-13 01/04/2013, 02/16/2013  . Pneumococcal Polysaccharide-23 03/11/2003, 01/16/2009, 09/26/2014  . Td 06/06/2001  . Tdap 07/17/2012  . Zoster 05/07/2007   Screening Tests Health Maintenance  Topic Date Due  . INFLUENZA VACCINE  12/04/2017  . MAMMOGRAM  05/28/2018  . COLONOSCOPY  01/15/2019  . TETANUS/TDAP  07/18/2022  . DEXA SCAN  Completed  . Hepatitis C Screening  Completed  . PNA vac Low Risk Adult  Completed      Plan:  Continue doing brain stimulating activities (puzzles, reading, adult coloring books, staying active) to keep memory sharp.   Continue to eat heart healthy diet (full of fruits, vegetables, whole grains, lean protein, water--limit salt, fat,  and sugar intake) and increase physical activity as tolerated.  I have personally reviewed and noted the following   in the patient's chart:   . Medical and social history . Use of alcohol, tobacco or illicit drugs  . Current medications and supplements . Functional ability and status . Nutritional status . Physical activity . Advanced directives . List of other physicians . Vitals . Screenings to include cognitive, depression, and falls . Referrals and appointments  In addition, I have reviewed and discussed with patient certain preventive protocols, quality metrics, and best practice recommendations. A written personalized care plan for preventive services as well as general preventive health recommendations were provided to patient.     Michiel Cowboy, RN  09/24/2017  Medical screening examination/treatment/procedure(s) were performed by non-physician practitioner and as supervising physician I was immediately available for consultation/collaboration. I agree with above. Lew Dawes, MD

## 2017-10-20 DIAGNOSIS — H00015 Hordeolum externum left lower eyelid: Secondary | ICD-10-CM | POA: Diagnosis not present

## 2017-10-23 DIAGNOSIS — H0015 Chalazion left lower eyelid: Secondary | ICD-10-CM | POA: Diagnosis not present

## 2017-11-12 ENCOUNTER — Encounter: Payer: Self-pay | Admitting: Internal Medicine

## 2017-11-12 ENCOUNTER — Ambulatory Visit (INDEPENDENT_AMBULATORY_CARE_PROVIDER_SITE_OTHER): Payer: Medicare Other | Admitting: Internal Medicine

## 2017-11-12 ENCOUNTER — Other Ambulatory Visit (INDEPENDENT_AMBULATORY_CARE_PROVIDER_SITE_OTHER): Payer: Medicare Other

## 2017-11-12 VITALS — BP 106/64 | HR 86 | Temp 97.8°F | Ht 62.0 in | Wt 99.0 lb

## 2017-11-12 DIAGNOSIS — I480 Paroxysmal atrial fibrillation: Secondary | ICD-10-CM

## 2017-11-12 DIAGNOSIS — J321 Chronic frontal sinusitis: Secondary | ICD-10-CM

## 2017-11-12 DIAGNOSIS — G4700 Insomnia, unspecified: Secondary | ICD-10-CM | POA: Diagnosis not present

## 2017-11-12 DIAGNOSIS — R202 Paresthesia of skin: Secondary | ICD-10-CM

## 2017-11-12 DIAGNOSIS — K589 Irritable bowel syndrome without diarrhea: Secondary | ICD-10-CM | POA: Diagnosis not present

## 2017-11-12 DIAGNOSIS — R634 Abnormal weight loss: Secondary | ICD-10-CM | POA: Diagnosis not present

## 2017-11-12 LAB — CBC WITH DIFFERENTIAL/PLATELET
BASOS PCT: 0.7 % (ref 0.0–3.0)
Basophils Absolute: 0.1 10*3/uL (ref 0.0–0.1)
EOS PCT: 1.5 % (ref 0.0–5.0)
Eosinophils Absolute: 0.1 10*3/uL (ref 0.0–0.7)
HCT: 41.4 % (ref 36.0–46.0)
Hemoglobin: 14.1 g/dL (ref 12.0–15.0)
LYMPHS ABS: 1.4 10*3/uL (ref 0.7–4.0)
Lymphocytes Relative: 17 % (ref 12.0–46.0)
MCHC: 34 g/dL (ref 30.0–36.0)
MCV: 90.9 fl (ref 78.0–100.0)
MONOS PCT: 9.7 % (ref 3.0–12.0)
Monocytes Absolute: 0.8 10*3/uL (ref 0.1–1.0)
NEUTROS ABS: 5.7 10*3/uL (ref 1.4–7.7)
NEUTROS PCT: 71.1 % (ref 43.0–77.0)
Platelets: 355 10*3/uL (ref 150.0–400.0)
RBC: 4.56 Mil/uL (ref 3.87–5.11)
RDW: 13 % (ref 11.5–15.5)
WBC: 8 10*3/uL (ref 4.0–10.5)

## 2017-11-12 LAB — BASIC METABOLIC PANEL
BUN: 23 mg/dL (ref 6–23)
CHLORIDE: 101 meq/L (ref 96–112)
CO2: 30 meq/L (ref 19–32)
Calcium: 9.5 mg/dL (ref 8.4–10.5)
Creatinine, Ser: 0.86 mg/dL (ref 0.40–1.20)
GFR: 68.81 mL/min (ref >60.00)
GLUCOSE: 71 mg/dL (ref 70–99)
Potassium: 4.3 mEq/L (ref 3.5–5.1)
SODIUM: 138 meq/L (ref 135–145)

## 2017-11-12 LAB — VITAMIN B12: Vitamin B-12: 1056 pg/mL — ABNORMAL HIGH (ref 211–911)

## 2017-11-12 MED ORDER — ZOSTER VAC RECOMB ADJUVANTED 50 MCG/0.5ML IM SUSR: 0.5000 mL | Freq: Once | INTRAMUSCULAR | 1 refills | Status: AC

## 2017-11-12 NOTE — Assessment & Plan Note (Signed)
Needs wt gain

## 2017-11-12 NOTE — Assessment & Plan Note (Signed)
Valerian root 

## 2017-11-12 NOTE — Assessment & Plan Note (Signed)
ASA

## 2017-11-12 NOTE — Progress Notes (Signed)
Subjective:  Patient ID: Monica [name], female    DOB: 08/15/1945  Age: 72 y.o. MRN: [id number]  CC: No chief complaint on file.   HPI Monica [name] presents for anxiety, lung problems, GERD f/u  Outpatient Medications Prior to Visit  Medication Sig Dispense Refill  . acetaminophen (TYLENOL) 500 MG tablet Take 500 mg by mouth every 6 (six) hours as needed for moderate pain or headache.    . ALPRAZolam (XANAX) 0.5 MG tablet Take 1 tablet (0.5 mg total) by mouth once as needed for up to 1 dose for anxiety. 15 tablet 0  . CALCIUM-VITAMIN D PO Take 1 tablet by mouth daily.    . Cholecalciferol (VITAMIN D) 1000 UNITS capsule Take 1,000 Units by mouth daily.      Marland Kitchen dextromethorphan (DELSYM) 30 MG/5ML liquid Take 15-30 mLs 2 (two) times daily as needed by mouth for cough.    Marland Kitchen ELIQUIS 5 MG TABS tablet TAKE 1 TABLET TWICE DAILY. 60 tablet 0  . Estradiol 10 MCG TABS Place 10 mcg 2 (two) times a week vaginally. Pt does not have set days    . fluticasone (FLONASE) 50 MCG/ACT nasal spray Place 2 sprays into both nostrils at bedtime as needed for allergies or rhinitis.    . Lactobacillus Rhamnosus, GG, (CULTURELLE PO) Take 1 tablet by mouth daily.    Marland Kitchen loratadine (CLARITIN) 10 MG tablet Take 10 mg daily as needed by mouth for allergies.     Marland Kitchen MAGNESIUM PO Take by mouth.    . metoprolol tartrate (LOPRESSOR) 25 MG tablet TAKE 1/2 TABLET DAILY WHEN NEEDED. 30 tablet 5  . Multiple Vitamins-Minerals (CENTRUM SILVER PO) Take 1 tablet by mouth daily.     Marland Kitchen omeprazole (PRILOSEC) 20 MG capsule Take 20 mg by mouth as needed.     Marland Kitchen oxymetazoline (NASAL SPRAY 12 HOUR) 0.05 % nasal spray Nasal Spray 12 Hour PRN    . ranitidine (ZANTAC) 300 MG tablet Take 150 mg by mouth 2 (two) times daily.      No facility-administered medications prior to visit.     ROS: Review of Systems  Constitutional: Negative for activity change, appetite change, chills, fatigue and unexpected weight change.  HENT: Negative for  congestion, mouth sores and sinus pressure.   Eyes: Negative for visual disturbance.  Respiratory: Positive for cough. Negative for chest tightness.   Gastrointestinal: Negative for abdominal pain and nausea.  Genitourinary: Negative for difficulty urinating, frequency and vaginal pain.  Musculoskeletal: Negative for back pain and gait problem.  Skin: Negative for pallor and rash.  Neurological: Negative for dizziness, tremors, weakness, numbness and headaches.  Psychiatric/Behavioral: Negative for confusion and sleep disturbance.    Objective:  BP 106/64 (BP Location: Left Arm, Patient Position: Sitting, Cuff Size: Normal)   Pulse 86   Temp 97.8 F (36.6 C) (Oral)   Ht 5\' 2"  (1.575 m)   Wt 99 lb (44.9 kg)   SpO2 98%   BMI 18.11 kg/m   BP Readings from Last 3 Encounters:  11/12/17 106/64  09/24/17 108/62  09/15/17 112/62    Wt Readings from Last 3 Encounters:  11/12/17 99 lb (44.9 kg)  09/24/17 100 lb (45.4 kg)  09/15/17 99 lb (44.9 kg)    Physical Exam  Constitutional: She appears well-developed. No distress.  HENT:  Head: Normocephalic.  Right Ear: External ear normal.  Left Ear: External ear normal.  Nose: Nose normal.  Mouth/Throat: Oropharynx is clear and moist.  Eyes: Pupils are  equal, round, and reactive to light. Conjunctivae are normal. Right eye exhibits no discharge. Left eye exhibits no discharge.  Neck: Normal range of motion. Neck supple. No JVD present. No tracheal deviation present. No thyromegaly present.  Cardiovascular: Normal rate, regular rhythm and normal heart sounds.  Pulmonary/Chest: No stridor. No respiratory distress. She has no wheezes.  Abdominal: Soft. Bowel sounds are normal. She exhibits no distension and no mass. There is no tenderness. There is no rebound and no guarding.  Musculoskeletal: She exhibits no edema or tenderness.  Lymphadenopathy:    She has no cervical adenopathy.  Neurological: She displays normal reflexes. No cranial  nerve deficit. She exhibits normal muscle tone. Coordination normal.  Skin: No rash noted. No erythema.  Psychiatric: She has a normal mood and affect. Her behavior is normal. Judgment and thought content normal.  thin  Lab Results  Component Value Date   WBC 11.5 (H) 03/24/2017   HGB 12.7 03/24/2017   HCT 37.9 03/24/2017   PLT 747.0 (H) 03/24/2017   GLUCOSE 131 (H) 03/14/2017   CHOL 157 04/13/2014   TRIG 71.0 04/13/2014   HDL 65.60 04/13/2014   LDLCALC 77 04/13/2014   ALT 17 09/28/2014   AST 28 09/28/2014   NA 137 03/14/2017   K 3.6 03/14/2017   CL 102 03/14/2017   CREATININE 0.70 03/14/2017   BUN 10 03/14/2017   CO2 26 03/14/2017   TSH 4.381 09/17/2015    Dg Chest 2 View  Result Date: 08/04/2017 CLINICAL DATA:  72 year old female with persistent cough. Chronic bronchiectasis and mucoid impaction. EXAM: CHEST - 2 VIEW COMPARISON:  07/28/2017 and earlier, including high-resolution chest CT 05/31/2016. FINDINGS: Chronic large lung volumes with increased AP dimension to the chest. Chronic lower lobe and right middle lobe predominant bronchiectasis demonstrated on the 2018 CT. No pneumothorax, pulmonary edema or pleural effusion. Middle and lower lobe irregular peribronchial opacity appears not significantly changed since November 2018. Similar apical scarring greater on the right. No acute pulmonary opacity identified. No acute osseous abnormality identified. Negative visible bowel gas pattern. IMPRESSION: Chronic lung disease including hyperinflation and Bronchiectasis with no superimposed acute findings identified. Electronically Signed   By: Genevie Ann M.D.   On: 08/04/2017 11:26    Assessment & Plan:   There are no diagnoses linked to this encounter.   No orders of the defined types were placed in this encounter.    Follow-up: No follow-ups on file.  Walker Kehr, MD

## 2017-11-12 NOTE — Assessment & Plan Note (Signed)
Labs Diet discussed

## 2017-11-12 NOTE — Assessment & Plan Note (Signed)
Doing well 

## 2017-11-20 IMAGING — DX DG CHEST 2V
2 series · 2 of 2 positions shown · non-contrast
Comparison: Prior study from 10/23/2009.

CLINICAL DATA: Initial evaluation for acute tachycardia.

EXAM:
CHEST  2 VIEW

[chest pa]
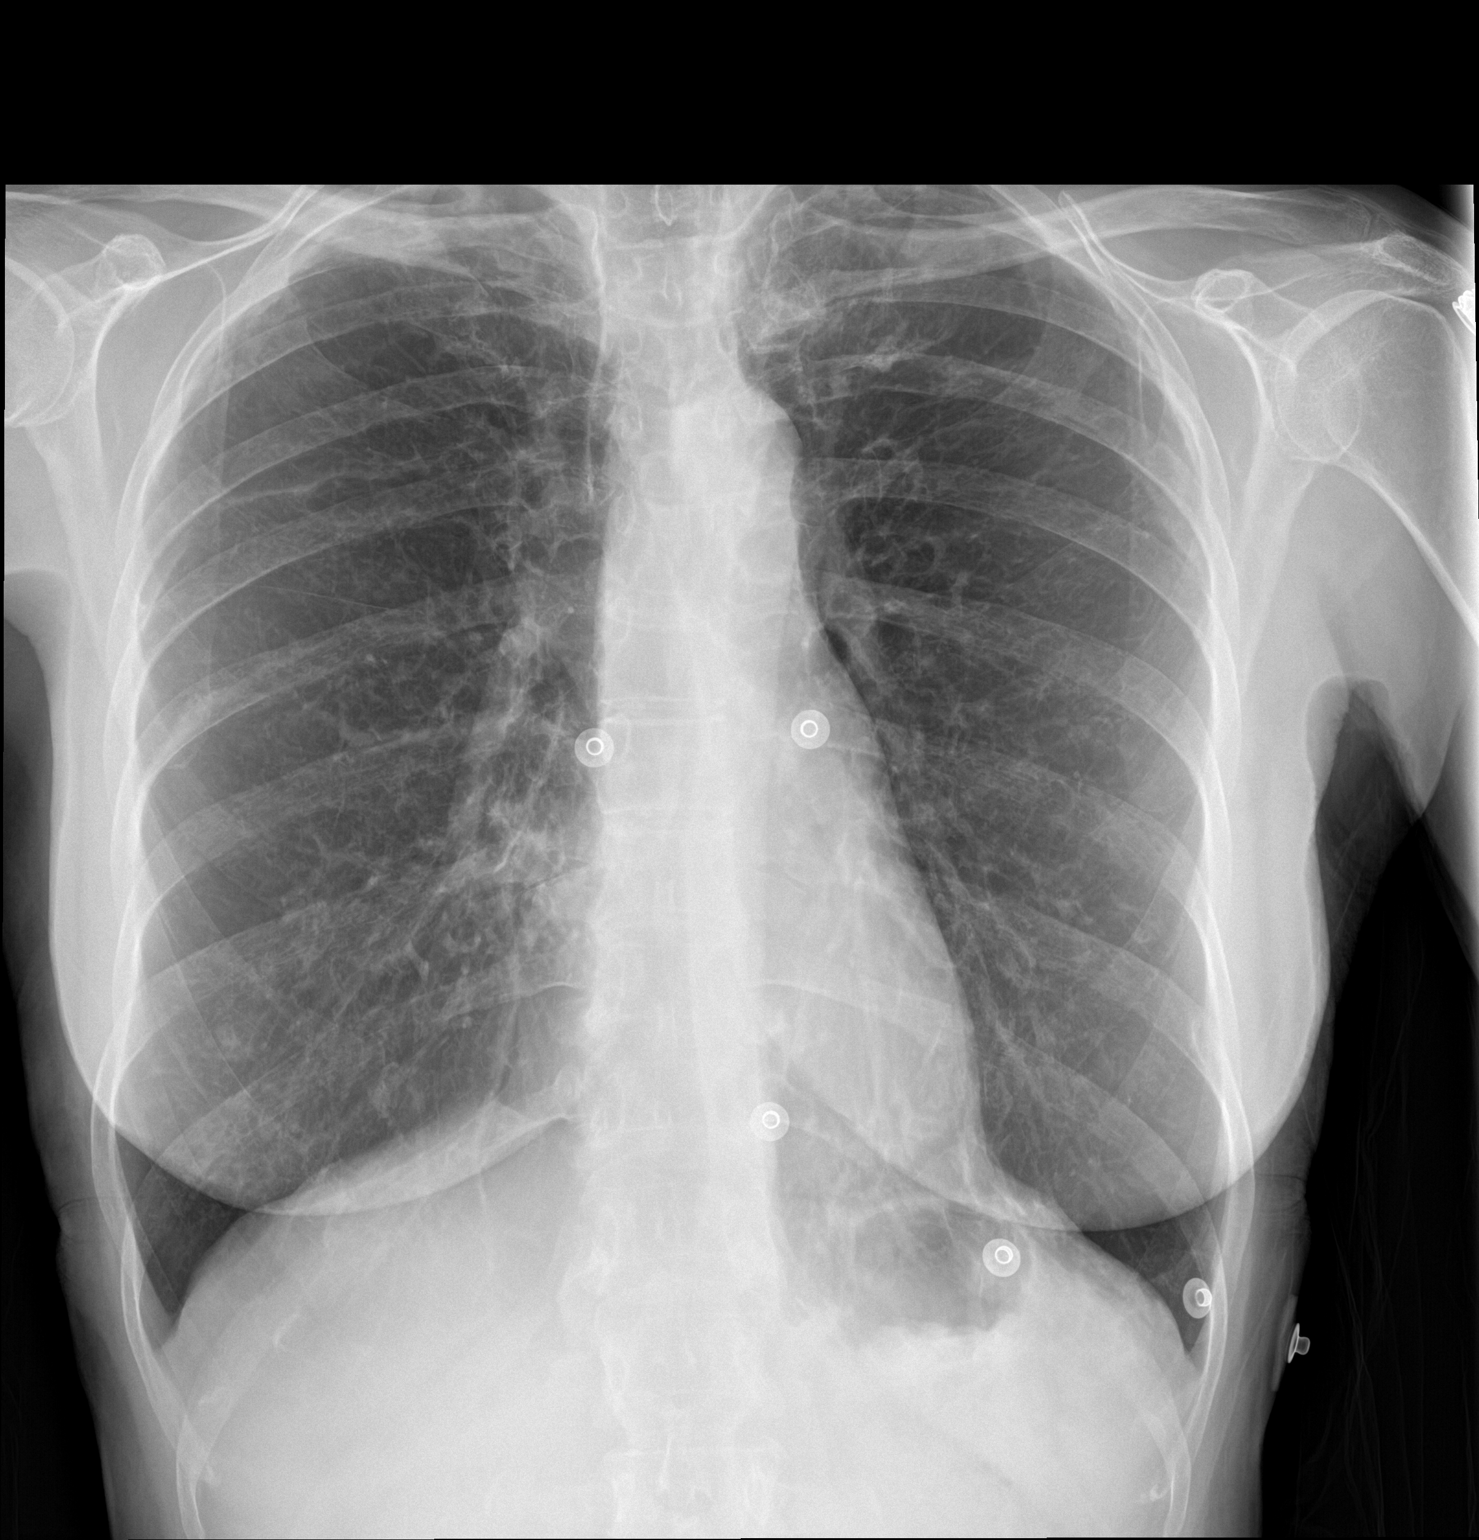

[chest lat]
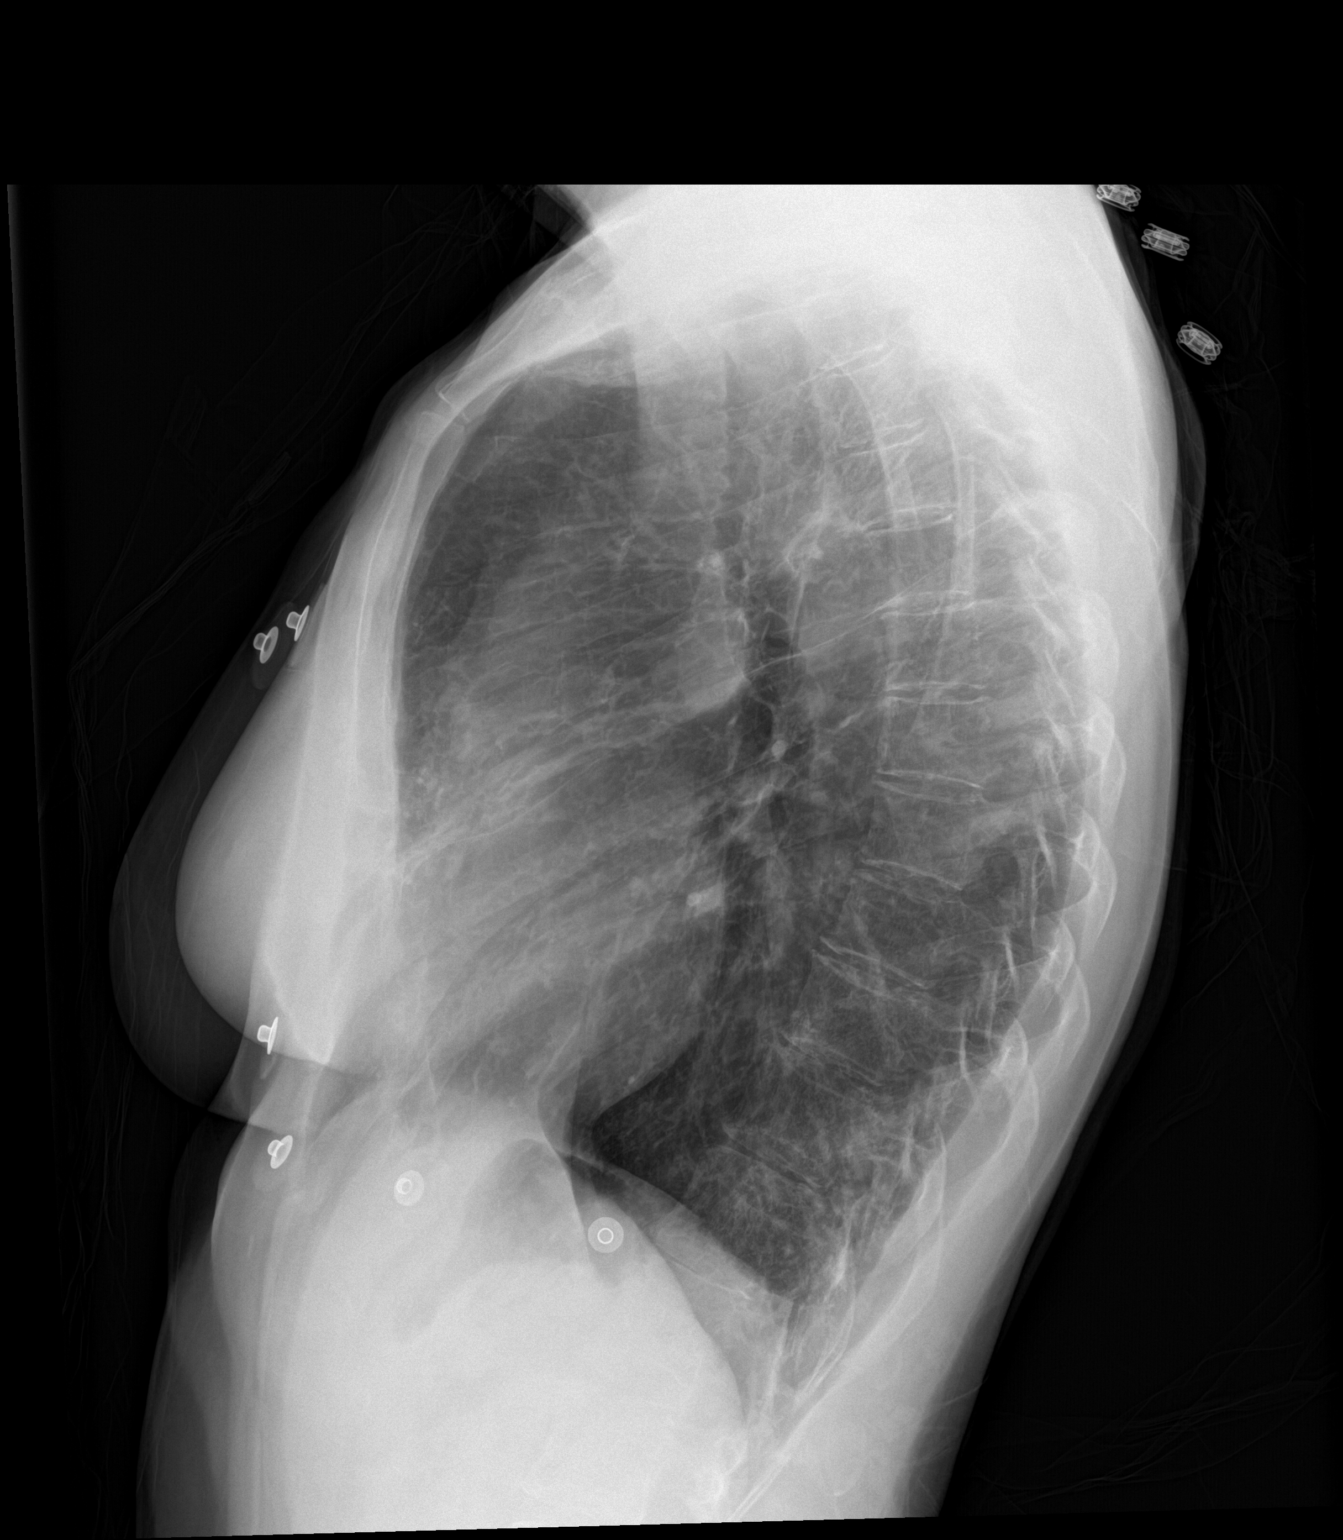

[2 of 2 positions shown; findings below may reference images not displayed]

FINDINGS: Cardiac mediastinal silhouettes are stable in size and contour, and
remain within normal limits.

Lungs are normally inflated. Scattered pleural-parenchymal scarring
at the lung apices, likely unchanged. No consolidative airspace
disease. No pulmonary edema or pleural effusion. No pneumothorax.

No acute osseus abnormality.
IMPRESSION: 1. No active cardiopulmonary disease.
2. Bilateral pleural parenchymal scarring at the lung apices,
similar to prior.

## 2017-12-22 ENCOUNTER — Ambulatory Visit (INDEPENDENT_AMBULATORY_CARE_PROVIDER_SITE_OTHER): Payer: Medicare Other | Admitting: Cardiovascular Disease

## 2017-12-22 ENCOUNTER — Encounter: Payer: Self-pay | Admitting: Cardiovascular Disease

## 2017-12-22 VITALS — BP 112/64 | HR 90 | Ht 62.0 in | Wt 100.2 lb

## 2017-12-22 DIAGNOSIS — Z7901 Long term (current) use of anticoagulants: Secondary | ICD-10-CM

## 2017-12-22 DIAGNOSIS — I480 Paroxysmal atrial fibrillation: Secondary | ICD-10-CM | POA: Diagnosis not present

## 2017-12-22 MED ORDER — METOPROLOL TARTRATE 25 MG PO TABS: 12.5000 mg | ORAL_TABLET | Freq: Every day | ORAL | 5 refills | Status: DC | PRN

## 2017-12-22 NOTE — Progress Notes (Signed)
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  She is accompanied today by her husband.  He has dementia and she can no longer leave him alone at home.  Palpitations are infrequent, mostly brief isolated beats rather than sustained atrial fibrillation.  They sound more like PVCs.  The palpitations always become more prominent when she has an exacerbation of her bronchiectasis or other respiratory issues.  She has no evidence of structural heart disease and has a relatively small left atrium. She has never had embolic events.  The patient specifically denies any chest pain at rest exertion, dyspnea at rest or with exertion, orthopnea, paroxysmal nocturnal dyspnea, syncope, focal neurological deficits, intermittent claudication, lower extremity edema, unexplained weight gain, hemoptysis or wheezing.  Other comorbid conditions include bronchiectasis, gastroesophageal reflux disease, irritable bowel syndrome, osteoporosis, chronic sinusitis and mild anxiety disorder.  Past Medical History:  Diagnosis Date  . Adenomatous colon polyp 10/2003  . Allergic rhinitis   . Allergy    SEASONAL  . Bronchiectasis   . Chronic sinusitis   . Diverticulosis   . GERD (gastroesophageal reflux disease)   . Hearing loss   . Hemorrhoids   . Insomnia   . Menopausal disorder   . Osteoporosis   . Palpitations   . TMJ syndrome   . Urticaria     Past Surgical History:  Procedure Laterality Date  . COLONOSCOPY    . NASAL SINUS SURGERY    . TYMPANOSTOMY    . VIDEO BRONCHOSCOPY Bilateral 07/16/2016   Procedure: VIDEO BRONCHOSCOPY WITH FLUORO;  Surgeon: Collene Gobble, MD;  Location: [facility];  Service: Cardiopulmonary;  Laterality: Bilateral;    Current Medications: Outpatient Medications Prior to Visit  Medication Sig Dispense Refill  . acetaminophen (TYLENOL) 500 MG tablet Take 500 mg by mouth every 6 (six) hours as needed for moderate pain or headache.    . ALPRAZolam (XANAX) 0.5 MG tablet Take 1 tablet (0.5 mg total) by mouth once as needed for up to 1 dose for anxiety. 15 tablet 0  . CALCIUM-VITAMIN D PO Take 1 tablet by mouth daily.    . Cholecalciferol (VITAMIN D) 1000 UNITS capsule Take 1,000 Units by mouth daily.      Marland Kitchen dextromethorphan (DELSYM) 30 MG/5ML liquid Take 15-30 mLs 2 (two) times daily as needed by mouth for cough.    Marland Kitchen ELIQUIS 5 MG TABS tablet TAKE 1 TABLET TWICE DAILY. 60 tablet 0  . Estradiol 10 MCG TABS Place 10 mcg 2 (two) times a week vaginally. Pt does not have set days    . fluticasone (FLONASE) 50 MCG/ACT nasal spray Place 2 sprays into both nostrils at bedtime as needed for allergies or rhinitis.    . Lactobacillus Rhamnosus, GG, (CULTURELLE PO) Take 1 tablet by mouth daily.    Marland Kitchen loratadine (CLARITIN) 10 MG tablet Take 10 mg daily as needed by mouth for allergies.     Marland Kitchen MAGNESIUM PO Take 250 mg by mouth daily.     . Multiple Vitamins-Minerals (CENTRUM SILVER PO) Take 1 tablet by mouth daily.     Marland Kitchen omeprazole (PRILOSEC) 20 MG capsule Take  20 mg by mouth as needed.     Marland Kitchen oxymetazoline (NASAL SPRAY 12 HOUR) 0.05 % nasal spray Nasal Spray 12 Hour PRN    . RANITIDINE HCL PO Take 100 mg by mouth 2 (two) times daily.     . metoprolol tartrate (LOPRESSOR) 25 MG tablet TAKE 1/2 TABLET DAILY WHEN NEEDED. 30 tablet 5   No facility-administered medications prior to visit.      Allergies:   Albuterol; Amoxicillin-pot clavulanate; Avelox [moxifloxacin hcl in nacl]; Bactrim [sulfamethoxazole-trimethoprim]; Benzonatate; and Ciprofloxacin   Social History   Socioeconomic History  . Marital status: Married    Spouse name:  Monica [name]  . Number of children: 0  . Years of education: BSN  . Highest education level: Not on file  Occupational History  . Occupation: retired Animal nutritionist  . Financial resource strain: Not hard at all  . Food insecurity:    Worry: Never true    Inability: Never true  . Transportation needs:    Medical: No    Non-medical: No  Tobacco Use  . Smoking status: Never Smoker  . Smokeless tobacco: Never Used  Substance and Sexual Activity  . Alcohol use: Yes    Alcohol/week: 2.0 standard drinks    Types: 2 Glasses of wine per week  . Drug use: No  . Sexual activity: Not Currently  Lifestyle  . Physical activity:    Days per week: 3 days    Minutes per session: 60 min  . Stress: Rather much  Relationships  . Social connections:    Talks on phone: More than three times a week    Gets together: More than three times a week    Attends religious service: More than 4 times per year    Active member of club or organization: Not on file    Attends meetings of clubs or organizations: More than 4 times per year    Relationship status: Married  Other Topics Concern  . Not on file  Social History Narrative   Lives with husband   Caffeine use: 1 cup tea per day     Family History:  The patient's family history includes AVM in her brother; Cancer in her maternal grandmother, mother, and paternal uncle; Colon cancer (age of onset: 65) in her mother; Heart disease in her father and maternal grandfather; Hypertension in her father; Rectal cancer in her mother; Seizures in her mother.   ROS:   Please see the history of present illness.    ROS all other systems are reviewed and are negative   PHYSICAL EXAM:   VS:  BP 112/64   Pulse 90   Ht 5\' 2"  (1.575 m)   Wt 100 lb 3.2 oz (45.5 kg)   BMI 18.33 kg/m      General: Alert, oriented x3, no distress, very lean Head: no evidence of trauma, PERRL, EOMI, no exophtalmos or lid lag, no myxedema, no xanthelasma; normal ears, nose and  oropharynx Neck: normal jugular venous pulsations and no hepatojugular reflux; brisk carotid pulses without delay and no carotid bruits Chest: clear to auscultation, no signs of consolidation by percussion or palpation, normal fremitus, symmetrical and full respiratory excursions Cardiovascular: normal position and quality of the apical impulse, regular rhythm, normal first and second heart sounds, no murmurs, rubs or gallops Abdomen: no tenderness or distention, no masses by palpation, no abnormal pulsatility or arterial bruits, normal bowel sounds, no hepatosplenomegaly Extremities: no clubbing, cyanosis or edema; 2+ radial, ulnar and brachial  pulses bilaterally; 2+ right femoral, posterior tibial and dorsalis pedis pulses; 2+ left femoral, posterior tibial and dorsalis pedis pulses; no subclavian or femoral bruits Neurological: grossly nonfocal Psych: Normal mood and affect    Wt Readings from Last 3 Encounters:  12/22/17 100 lb 3.2 oz (45.5 kg)  11/12/17 99 lb (44.9 kg)  09/24/17 100 lb (45.4 kg)      Studies/Labs Reviewed:   EKG:  EKG is ordered today.  It shows normal sinus rhythm, normal tracing, QTC 423 ms  Recent Labs: 11/12/2017: BUN 23; Creatinine, Ser 0.86; Hemoglobin 14.1; Platelets 355.0; Potassium 4.3; Sodium 138   Lipid Panel    Component Value Date/Time   CHOL 157 04/13/2014 1156   TRIG 71.0 04/13/2014 1156   HDL 65.60 04/13/2014 1156   CHOLHDL 2 04/13/2014 1156   VLDL 14.2 04/13/2014 1156   LDLCALC 77 04/13/2014 1156     ASSESSMENT:    1. PAF (paroxysmal atrial fibrillation) ([place])   2. Long term current use of anticoagulant      PLAN:   1. AFib: Firm documentation of atrial fibrillation has not occurred in the last year or so.  Current palpitations are more like PVCs.  The arrhythmia is minimally symptomatic.  She is on anticoagulation and has not had serious bleeding problems. CHADSVasc 2 (age, gender).  She has a relatively small left atrium. 2.  Eliquis: Tolerating anticoagulation without any serious bleeding problems  Medication Adjustments/Labs and Tests Ordered: Current medicines are reviewed at length with the patient today.  Concerns regarding medicines are outlined above.  Medication changes, Labs and Tests ordered today are listed in the Patient Instructions below. Patient Instructions  Dr Sallyanne Kuster recommends that you schedule a follow-up appointment in 6 months. You will receive a reminder letter in the mail two months in advance. If you don't receive a letter, please call our office to schedule the follow-up appointment.  If you need a refill on your cardiac medications before your next appointment, please call your pharmacy.    Signed, Sanda Klein, MD  12/24/2017 8:47 AM    [facility] [address], [place], [place]  [address] Phone: [phone]; Fax: [phone]

## 2017-12-22 NOTE — Patient Instructions (Signed)
Dr Croitoru recommends that you schedule a follow-up appointment in 6 months. You will receive a reminder letter in the mail two months in advance. If you don't receive a letter, please call our office to schedule the follow-up appointment.  If you need a refill on your cardiac medications before your next appointment, please call your pharmacy. 

## 2017-12-24 ENCOUNTER — Encounter: Payer: Self-pay | Admitting: Cardiovascular Disease

## 2018-01-14 DIAGNOSIS — M810 Age-related osteoporosis without current pathological fracture: Secondary | ICD-10-CM | POA: Diagnosis not present

## 2018-01-14 DIAGNOSIS — M8589 Other specified disorders of bone density and structure, multiple sites: Secondary | ICD-10-CM | POA: Diagnosis not present

## 2018-01-14 LAB — HM DEXA SCAN: HM DEXA SCAN: -2.7

## 2018-01-16 ENCOUNTER — Encounter: Payer: Self-pay | Admitting: Internal Medicine

## 2018-01-22 ENCOUNTER — Ambulatory Visit (INDEPENDENT_AMBULATORY_CARE_PROVIDER_SITE_OTHER): Payer: Medicare Other

## 2018-01-22 DIAGNOSIS — N952 Postmenopausal atrophic vaginitis: Secondary | ICD-10-CM | POA: Diagnosis not present

## 2018-01-22 DIAGNOSIS — Z23 Encounter for immunization: Secondary | ICD-10-CM

## 2018-01-22 DIAGNOSIS — M810 Age-related osteoporosis without current pathological fracture: Secondary | ICD-10-CM | POA: Diagnosis not present

## 2018-01-22 DIAGNOSIS — Z124 Encounter for screening for malignant neoplasm of cervix: Secondary | ICD-10-CM | POA: Diagnosis not present

## 2018-01-23 ENCOUNTER — Encounter: Payer: Self-pay | Admitting: Internal Medicine

## 2018-01-23 NOTE — Progress Notes (Signed)
Abstracted and sent to scan  

## 2018-01-30 ENCOUNTER — Encounter: Payer: Self-pay | Admitting: Pulmonary Disease

## 2018-01-30 ENCOUNTER — Ambulatory Visit (INDEPENDENT_AMBULATORY_CARE_PROVIDER_SITE_OTHER): Payer: Medicare Other | Admitting: Pulmonary Disease

## 2018-01-30 VITALS — BP 110/60 | HR 108 | Temp 97.1°F | Ht 62.0 in | Wt 99.2 lb

## 2018-01-30 DIAGNOSIS — J479 Bronchiectasis, uncomplicated: Secondary | ICD-10-CM

## 2018-01-30 DIAGNOSIS — J321 Chronic frontal sinusitis: Secondary | ICD-10-CM | POA: Diagnosis not present

## 2018-01-30 DIAGNOSIS — K219 Gastro-esophageal reflux disease without esophagitis: Secondary | ICD-10-CM | POA: Diagnosis not present

## 2018-01-30 MED ORDER — FLUTTER DEVI: 0 refills | Status: AC

## 2018-01-30 MED ORDER — DOXYCYCLINE HYCLATE 100 MG PO TABS: 100.0000 mg | ORAL_TABLET | Freq: Two times a day (BID) | ORAL | 0 refills | Status: DC

## 2018-01-30 NOTE — Assessment & Plan Note (Signed)
  Continue Zantac as prescribed Continue to follow GERD diet Consider starting daily PPI such as omeprazole

## 2018-01-30 NOTE — Progress Notes (Signed)
@Patient  ID: Monica [name], female    DOB: Feb 06, 1946, 72 y.o.   MRN: [id number]  Chief Complaint  Patient presents with  . Acute Visit    Congestion, sore throat    Referring provider: Plotnikov, Evie Lacks, MD  HPI:  72 year old female patient followed in our office for bronchiectasis, allergic rhinitis  PMH: GERD, A. fib (on Eliquis) Smoker/ Smoking History: Non-smoker Maintenance: None Pt of: [name]  01/30/2018  - Visit   72 year old female presenting today for acute visit.  Patient reports that she is had 2 days of sore throat, nasal drainage, cough.  Patient reports the cough is dry and worsens when she lays flat "stuck in her chest that she can bring up".  Patient with known history of bronchiectasis.  Patient has not been using flutter valve testing and a new one as she says her up current one is old and she would like to replace it.  Patient denies any recent bronchitic flares.  Patient also denies any recent antibiotic use.  Patient reports last time she is antibiotics was in the spring/2019.  Patient has multiple allergies regarding antibiotics.  Patient reports that typically doxycycline is what she takes and she has had no issues with that before.   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 Albuterol Other (See Comments)    Patient states put her into atrial fib last time she took this medication  . Amoxicillin-Pot Clavulanate Diarrhea    Has patient had a PCN reaction causing immediate rash, facial/tongue/throat swelling, SOB or lightheadedness with hypotension:No Has patient had a PCN reaction causing severe rash involving mucus membranes or skin necrosis:No Has patient had a PCN reaction that required hospitalization:No Has patient had a PCN reaction occurring within the last 10 years:Yes If all of the above answers are "NO", then may proceed with Cephalosporin use  . Avelox [Moxifloxacin Hcl In Nacl] Nausea Only  . Bactrim [Sulfamethoxazole-Trimethoprim] Other (See Comments)    flushing  . Benzonatate Other (See Comments)    felt bad, out of sorts, disoriented.  . Ciprofloxacin Rash    Rash across abdomen    Immunization History  Administered Date(s) Administered  . Influenza Split 02/25/2011, 02/05/2012  . Influenza Whole 03/02/2008, 03/02/2009, 02/06/2010  . Influenza, High Dose Seasonal PF 01/28/2017  . Influenza,inj,Quad PF,6+ Mos 02/04/2013, 01/24/2014, 02/02/2015, 02/01/2016, 01/22/2018  . Influenza-Unspecified 01/04/2017  . Pneumococcal Conjugate-13 01/04/2013, 02/16/2013  . Pneumococcal Polysaccharide-23 03/11/2003, 01/16/2009, 09/26/2014  . Td 06/06/2001  . Tdap 07/17/2012  . Zoster 05/07/2007  . Zoster Recombinat (Shingrix) 12/09/2017    Past  Medical History:  Diagnosis Date  . Adenomatous colon polyp 10/2003  . Allergic rhinitis   . Allergy    SEASONAL  . Bronchiectasis   . Chronic sinusitis   . Diverticulosis   . GERD (gastroesophageal reflux disease)   . Hearing loss   . Hemorrhoids   . Insomnia   . Menopausal disorder   .  Osteoporosis   . Palpitations   . TMJ syndrome   . Urticaria     Tobacco History: Social History   Tobacco Use  Smoking Status Never Smoker  Smokeless Tobacco Never Used   Counseling given: Not Answered  Continue non-smoking  Outpatient Encounter Medications as of 01/30/2018  Medication Sig  . acetaminophen (TYLENOL) 500 MG tablet Take 500 mg by mouth every 6 (six) hours as needed for moderate pain or headache.  . ALPRAZolam (XANAX) 0.5 MG tablet Take 1 tablet (0.5 mg total) by mouth once as needed for up to 1 dose for anxiety.  Marland Kitchen CALCIUM-VITAMIN D PO Take 1 tablet by mouth daily.  . Cholecalciferol (VITAMIN D) 1000 UNITS capsule Take 1,000 Units by mouth daily.    Marland Kitchen dextromethorphan (DELSYM) 30 MG/5ML liquid Take 15-30 mLs 2 (two) times daily as needed by mouth for cough.  Marland Kitchen ELIQUIS 5 MG TABS tablet TAKE 1 TABLET TWICE DAILY.  Marland Kitchen Estradiol 10 MCG TABS Place 10 mcg 2 (two) times a week vaginally. Pt does not have set days  . fluticasone (FLONASE) 50 MCG/ACT nasal spray Place 2 sprays into both nostrils at bedtime as needed for allergies or rhinitis.  . Lactobacillus Rhamnosus, GG, (CULTURELLE PO) Take 1 tablet by mouth daily.  Marland Kitchen loratadine (CLARITIN) 10 MG tablet Take 10 mg daily as needed by mouth for allergies.   Marland Kitchen MAGNESIUM PO Take 250 mg by mouth daily.   . metoprolol tartrate (LOPRESSOR) 25 MG tablet Take 0.5 tablets (12.5 mg total) by mouth daily as needed.  . Multiple Vitamins-Minerals (CENTRUM SILVER PO) Take 1 tablet by mouth daily.   Marland Kitchen omeprazole (PRILOSEC) 20 MG capsule Take 20 mg by mouth as needed.   Marland Kitchen oxymetazoline (NASAL SPRAY 12 HOUR) 0.05 % nasal spray Nasal Spray 12 Hour PRN  . RANITIDINE HCL PO Take 100 mg by mouth 2 (two) times daily.   Marland Kitchen doxycycline (VIBRA-TABS) 100 MG tablet Take 1 tablet (100 mg total) by mouth 2 (two) times daily.  Marland Kitchen Respiratory Therapy Supplies (FLUTTER) DEVI Twice a day and prn as needed, may increase if feeling worse   No  facility-administered encounter medications on file as of 01/30/2018.      Review of Systems  Review of Systems  Constitutional: Positive for fatigue. Negative for chills, fever and unexpected weight change.  HENT: Positive for congestion, postnasal drip and sinus pressure. Negative for ear pain and sinus pain.   Respiratory: Positive for cough (dry cough, worse at night when flat ) and shortness of breath. Negative for chest tightness and wheezing.   Cardiovascular: Negative for chest pain and palpitations.  Gastrointestinal: Positive for nausea. Negative for blood in stool, diarrhea and vomiting.       Denies indigestion   Genitourinary: Negative for dysuria, frequency and urgency.  Musculoskeletal: Negative for arthralgias.  Skin: Negative for color change.  Allergic/Immunologic: Negative for environmental allergies and food allergies.  Neurological: Positive for headaches. Negative for dizziness and light-headedness.  Psychiatric/Behavioral: Negative for dysphoric mood. The patient is not nervous/anxious.   All other systems reviewed and are negative.    Physical Exam  BP 110/60 (BP Location: Left Arm, Cuff Size: Normal)   Pulse (!) 108   Temp (!) 97.1 F (36.2 C) (Oral)   Ht 5\' 2"  (1.575 m)   Wt 99 lb 3.2 oz (45 kg)   SpO2 96%   BMI 18.14 kg/m   Wt Readings from Last 5 Encounters:  01/30/18 99 lb 3.2 oz (45 kg)  12/22/17 100 lb 3.2 oz (45.5 kg)  11/12/17 99 lb (44.9 kg)  09/24/17 100 lb (45.4 kg)  09/15/17 99 lb (44.9 kg)     Physical Exam  Constitutional: She is oriented to person, place, and time and well-developed, well-nourished, and in no distress. No distress.  HENT:  Head: Normocephalic and atraumatic.  Right Ear: Hearing, tympanic membrane, external ear and ear canal normal.  Left Ear: Hearing, tympanic membrane, external ear and ear canal normal.  Nose: Mucosal edema present. Right sinus exhibits frontal sinus tenderness. Right sinus exhibits no maxillary  sinus tenderness. Left sinus exhibits maxillary sinus tenderness and frontal sinus tenderness.  Mouth/Throat: Uvula is midline and oropharynx is clear and moist. No oropharyngeal exudate.  Eyes: Pupils are equal, round, and reactive to light.  Neck: Normal range of motion. Neck supple. No JVD present.  Cardiovascular: Normal rate, regular rhythm and normal heart sounds.  Pulmonary/Chest: Effort normal and breath sounds normal. No accessory muscle usage. No respiratory distress. She has no decreased breath sounds. She has no wheezes. She has no rhonchi.  Abdominal: Soft. Bowel sounds are normal. There is no tenderness.  Musculoskeletal: Normal range of motion. She exhibits no edema.  Lymphadenopathy:    She has no cervical adenopathy.  Neurological: She is alert and oriented to person, place, and time. Gait normal.  Skin: Skin is warm and dry. She is not diaphoretic. No erythema.  Psychiatric: Mood, memory, affect and judgment normal.  Nursing note and vitals reviewed.    Lab Results:  CBC    Component Value Date/Time   WBC 8.0 11/12/2017 0905   RBC 4.56 11/12/2017 0905   HGB 14.1 11/12/2017 0905   HGB 13.0 09/25/2016 1017   HCT 41.4 11/12/2017 0905   HCT 37.7 09/25/2016 1017   PLT 355.0 11/12/2017 0905   PLT 411 (H) 09/25/2016 1017   MCV 90.9 11/12/2017 0905   MCV 88 09/25/2016 1017   MCH 30.8 03/14/2017 0424   MCHC 34.0 11/12/2017 0905   RDW 13.0 11/12/2017 0905   RDW 12.8 09/25/2016 1017   LYMPHSABS 1.4 11/12/2017 0905   LYMPHSABS 1.5 09/25/2016 1017   MONOABS 0.8 11/12/2017 0905   EOSABS 0.1 11/12/2017 0905   EOSABS 0.2 09/25/2016 1017   BASOSABS 0.1 11/12/2017 0905   BASOSABS 0.0 09/25/2016 1017    BMET    Component Value Date/Time   NA 138 11/12/2017 0905   K 4.3 11/12/2017 0905   CL 101 11/12/2017 0905   CO2 30 11/12/2017 0905   GLUCOSE 71 11/12/2017 0905   BUN 23 11/12/2017 0905   CREATININE 0.86 11/12/2017 0905   CALCIUM 9.5 11/12/2017 0905   GFRNONAA  >60 03/14/2017 0424   GFRAA >60 03/14/2017 0424    BNP No results found for: BNP  ProBNP No results found for: PROBNP  Imaging: No results found.    Assessment & Plan:   Pleasant 72 year old patient seen for acute visit today.  Patient with probable sinusitis we will treat with doxycycline.  We will hold off on this time.  Patient agrees.  Provided for patient for flutter valve.  Talked with  patient about considering PPI for better GERD management.  Patient wants to like to remain on Zantac as well as being adherent to GERD diet.  Will provide education at discharge.  Bronchiectasis without acute exacerbation (HCC) Doxycycline >>> 1 100 mg tablet every 12 hours for 7 days >>>take with food  >>>wear sunscreen   Flutter valve prescription provided today >>> Contact that to any DME company >>> Continue to use current flutter valve, until able to pick up a new one  [facility] address [address]  Bronchiectasis: This is the medical term which indicates that you have damage, dilated airways making you more susceptible to respiratory infection. Use a flutter valve 10 breaths twice a day or 4 to 5 breaths 4-5 times a day to help clear mucus out Let us know if you have cough with change in mucus color or fevers or chills.  At that point you would need an antibiotic. Maintain a healthy nutritious diet, eating whole foods Take your medications as prescribed   Continue Zantac as prescribed Continue to follow GERD diet Consider starting daily PPI such as omeprazole   Keep follow-up with Dr. Lamonte Sakai in November/2019  GERD  Continue Zantac as prescribed Continue to follow GERD diet Consider starting daily PPI such as omeprazole   Sinusitis, chronic Doxycycline >>> 1 100 mg tablet every 12 hours for 7 days >>>take with food  >>>wear sunscreen   Keep follow-up with Dr. Lamonte Sakai in [place], NP 01/30/2018

## 2018-01-30 NOTE — Assessment & Plan Note (Signed)
Doxycycline >>> 1 100 mg tablet every 12 hours for 7 days >>>take with food  >>>wear sunscreen   Flutter valve prescription provided today >>> Contact that to any DME company >>> Continue to use current flutter valve, until able to pick up a new one  [facility] address [address]  Bronchiectasis: This is the medical term which indicates that you have damage, dilated airways making you more susceptible to respiratory infection. Use a flutter valve 10 breaths twice a day or 4 to 5 breaths 4-5 times a day to help clear mucus out Let us know if you have cough with change in mucus color or fevers or chills.  At that point you would need an antibiotic. Maintain a healthy nutritious diet, eating whole foods Take your medications as prescribed   Continue Zantac as prescribed Continue to follow GERD diet Consider starting daily PPI such as omeprazole   Keep follow-up with Dr. Lamonte Sakai in November/2019

## 2018-01-30 NOTE — Assessment & Plan Note (Signed)
Doxycycline >>> 1 100 mg tablet every 12 hours for 7 days >>>take with food  >>>wear sunscreen   Keep follow-up with Dr. Lamonte Sakai in November/2019

## 2018-01-30 NOTE — Patient Instructions (Addendum)
Doxycycline >>> 1 100 mg tablet every 12 hours for 7 days >>>take with food  >>>wear sunscreen   Flutter valve prescription provided today >>> Contact that to any DME company >>> Continue to use current flutter valve, until able to pick up a new one  [facility] address [address]   Bronchiectasis: This is the medical term which indicates that you have damage, dilated airways making you more susceptible to respiratory infection. Use a flutter valve 10 breaths twice a day or 4 to 5 breaths 4-5 times a day to help clear mucus out Let us know if you have cough with change in mucus color or fevers or chills.  At that point you would need an antibiotic. Maintain a healthy nutritious diet, eating whole foods Take your medications as prescribed   Continue Zantac as prescribed Continue to follow GERD diet Consider starting daily PPI such as omeprazole  Keep follow-up with Dr. Lamonte Sakai in November/2019  It is flu season:   >>>Remember to be washing your hands regularly, using hand sanitizer, be careful to use around herself with has contact with people who are sick will increase her chances of getting sick yourself. >>> Best ways to protect herself from the flu: Receive the yearly flu vaccine, practice good hand hygiene washing with soap and also using hand sanitizer when available, eat a nutritious meals, get adequate rest, hydrate appropriately    As of 03/09/2018 we will be moving! We will no longer be at our [place] location.   Our new address and phone number will be:  [address] [place], [place] [address] Telephone number: [phone]   Please contact the office if your symptoms worsen or you have concerns that you are not improving.   Thank you for choosing [facility] for your healthcare, and for allowing [place] to partner with you on your healthcare journey. I am thankful to be able to provide care to you today.    Wyn Quaker FNP-C    Bronchiectasis Bronchiectasis is a condition in which the airways (bronchi) are damaged and widened. This makes it difficult for the lungs to get rid of mucus. As a result, mucus gathers in the airways, and this often leads to lung infections. Infection can cause inflammation in the airways, which may further weaken and damage the bronchi. What are the causes? Bronchiectasis may be present at birth (congenital) or may develop later in life. Sometimes there is no apparent cause. Some common causes include:  Cystic fibrosis.  Recurrent lung infections (such as pneumonia, tuberculosis, or fungal infections).  Foreign bodies or other blockages in the lungs.  Breathing in fluid, food, or other foreign objects (aspiration).  What are the signs or symptoms? Common symptoms include:  A daily cough that brings up mucus and lasts for more than 3 weeks.  Frequent lung infections (such as pneumonia, tuberculosis, or fungal infections).  Shortness of breath and wheezing.  Weakness and fatigue.  How is this diagnosed? Various tests may be done to help diagnose bronchiectasis. Tests may include:  Chest X-rays or CT scans.  Breathing tests to help determine how your lungs are working.  Sputum cultures to check for infection.  Blood tests and other tests to check for related diseases or causes, such as cystic fibrosis.  How is this treated? Treatment varies depending on the severity of the condition. Medicines may be given to loosen the mucus to be coughed up (expectorants), to relax  the muscles of the air passages (bronchodilators), or to prevent or treat infections (antibiotics). Physical therapy methods may be recommended to help clear mucus from the lungs. For severe cases, surgery may be done to remove the affected part of the lung. Follow these instructions at home:  Get plenty of rest.  Only take over-the-counter or prescription medicines as directed by your  health care provider. If antibiotic medicines were prescribed, take them as directed. Finish them even if you start to feel better.  Avoid sedatives and antihistamines unless otherwise directed by your health care provider. These medicines tend to thicken the mucus in the lungs.  Perform any breathing exercises or techniques to clear the lungs as directed by your health care provider.  Drink enough fluids to keep your urine clear or pale yellow.  Consider using a cold steam vaporizer or humidifier in your room or home to help loosen secretions.  If the cough is worse at night, try sleeping in a semi-upright position in a recliner or using a couple of pillows.  Avoid cigarette smoke and lung irritants. If you smoke, quit.  Stay inside when pollution and ozone levels are high.  Stay current with vaccinations and immunizations.  Follow up with your health care provider as directed. Contact a health care provider if:  You cough up more thick, discolored mucus (sputum) that is yellow to green in color.  You have a fever or persistent symptoms for more than 2-3 days.  You cannot control your cough and are losing sleep. Get help right away if:  You cough up blood.  You have chest pain or increasing shortness of breath.  You have pain that is getting worse or is uncontrolled with medicines.  You have a fever and your symptoms suddenly get worse. This information is not intended to replace advice given to you by your health care provider. Make sure you discuss any questions you have with your health care provider. Document Released: 02/17/2007 Document Revised: 10/04/2015 Document Reviewed: 10/28/2012 Elsevier Interactive Patient Education  2017 [place] for Gastroesophageal Reflux Disease, Adult When you have gastroesophageal reflux disease (GERD), the foods you eat and your eating habits are very important. Choosing the right foods can help ease your  discomfort. What guidelines do I need to follow?  Choose fruits, vegetables, whole grains, and low-fat dairy products.  Choose low-fat meat, fish, and poultry.  Limit fats such as oils, salad dressings, butter, nuts, and avocado.  Keep a food diary. This helps you identify foods that cause symptoms.  Avoid foods that cause symptoms. These may be different for everyone.  Eat small meals often instead of 3 large meals a day.  Eat your meals slowly, in a place where you are relaxed.  Limit fried foods.  Cook foods using methods other than frying.  Avoid drinking alcohol.  Avoid drinking large amounts of liquids with your meals.  Avoid bending over or lying down until 2-3 hours after eating. What foods are not recommended? These are some foods and drinks that may make your symptoms worse: Vegetables Tomatoes. Tomato juice. Tomato and spaghetti sauce. Chili peppers. Onion and garlic. Horseradish. Fruits Oranges, grapefruit, and lemon (fruit and juice). Meats High-fat meats, fish, and poultry. This includes hot dogs, ribs, ham, sausage, salami, and bacon. Dairy Whole milk and chocolate milk. Sour cream. Cream. Butter. Ice cream. Cream cheese. Drinks Coffee and tea. Bubbly (carbonated) drinks or energy drinks. Condiments Hot sauce. Barbecue sauce. Sweets/Desserts Chocolate and  cocoa. Donuts. Peppermint and spearmint. Fats and Oils High-fat foods. This includes [place] fries and potato chips. Other Vinegar. Strong spices. This includes black pepper, white pepper, red pepper, cayenne, curry powder, cloves, ginger, and chili powder. The items listed above may not be a complete list of foods and drinks to avoid. Contact your dietitian for more information. This information is not intended to replace advice given to you by your health care provider. Make sure you discuss any questions you have with your health care provider. Document Released: 10/22/2011 Document Revised:  09/28/2015 Document Reviewed: 02/24/2013 Elsevier Interactive Patient Education  2017 [employer].     Gastroesophageal Reflux Disease, Adult Normally, food travels down the esophagus and stays in the stomach to be digested. If a person has gastroesophageal reflux disease (GERD), food and stomach acid move back up into the esophagus. When this happens, the esophagus becomes sore and swollen (inflamed). Over time, GERD can make small holes (ulcers) in the lining of the esophagus. Follow these instructions at home: Diet  Follow a diet as told by your doctor. You may need to avoid foods and drinks such as: ? Coffee and tea (with or without caffeine). ? Drinks that contain alcohol. ? Energy drinks and sports drinks. ? Carbonated drinks or sodas. ? Chocolate and cocoa. ? Peppermint and mint flavorings. ? Garlic and onions. ? Horseradish. ? Spicy and acidic foods, such as peppers, chili powder, curry powder, vinegar, hot sauces, and BBQ sauce. ? Citrus fruit juices and citrus fruits, such as oranges, lemons, and limes. ? Tomato-based foods, such as red sauce, chili, salsa, and pizza with red sauce. ? Fried and fatty foods, such as donuts, french fries, potato chips, and high-fat dressings. ? High-fat meats, such as hot dogs, rib eye steak, sausage, ham, and bacon. ? High-fat dairy items, such as whole milk, butter, and cream cheese.  Eat small meals often. Avoid eating large meals.  Avoid drinking large amounts of liquid with your meals.  Avoid eating meals during the 2-3 hours before bedtime.  Avoid lying down right after you eat.  Do not exercise right after you eat. General instructions  Pay attention to any changes in your symptoms.  Take over-the-counter and prescription medicines only as told by your doctor. Do not take aspirin, ibuprofen, or other NSAIDs unless your doctor says it is okay.  Do not use any tobacco products, including cigarettes, chewing tobacco, and  e-cigarettes. If you need help quitting, ask your doctor.  Wear loose clothes. Do not wear anything tight around your waist.  Raise (elevate) the head of your bed about 6 inches (15 cm).  Try to lower your stress. If you need help doing this, ask your doctor.  If you are overweight, lose an amount of weight that is healthy for you. Ask your doctor about a safe weight loss goal.  Keep all follow-up visits as told by your doctor. This is important. Contact a doctor if:  You have new symptoms.  You lose weight and you do not know why it is happening.  You have trouble swallowing, or it hurts to swallow.  You have wheezing or a cough that keeps happening.  Your symptoms do not get better with treatment.  You have a hoarse voice. Get help right away if:  You have pain in your arms, neck, jaw, teeth, or back.  You feel sweaty, dizzy, or light-headed.  You have chest pain or shortness of breath.  You throw up (vomit) and your throw  up looks like blood or coffee grounds.  You pass out (faint).  Your poop (stool) is bloody or black.  You cannot swallow, drink, or eat. This information is not intended to replace advice given to you by your health care provider. Make sure you discuss any questions you have with your health care provider. Document Released: 10/09/2007 Document Revised: 09/28/2015 Document Reviewed: 08/17/2014 Elsevier Interactive Patient Education  [employer].

## 2018-02-01 ENCOUNTER — Telehealth: Payer: Self-pay | Admitting: Pulmonary Disease

## 2018-02-01 NOTE — Telephone Encounter (Signed)
Known bronchiectasis Given doxycycline for sinusitis on 9/27. Continues to feel bad. Cough productive of yellow sputum, occasional wheezing. Called in to  [facility] -Prednisone 10 mg tabs  Take 2 tabs daily with food x 5ds, then 1 tab daily with food x 5ds then STOP

## 2018-02-04 NOTE — Progress Notes (Signed)
_0  ID: Monica [name], female    DOB: 12/01/1945, 72 y.o.   MRN: [id number]  Chief Complaint  Patient presents with  . Acute Visit    Cough, productive cough, RB patient     Referring provider: Plotnikov, Evie Lacks, MD  HPI:  72 year old female patient followed in our office for bronchiectasis, allergic rhinitis  PMH: GERD, A. fib (on Eliquis) Smoker/ Smoking History: Non-smoker Maintenance: None Pt of: Monica [name]  01/30/2018  - Visit - BM  72 year old patient presenting today for acute visit.  Patient has had 2 days of sore throat, nasal drainage, cough.  Patient reports the cough is dry and worsens when she lays flat.  Feels like she has things stuck in her chest that she cannot bring up.  Patient with known history of bronchiectasis.  Patient has not been using her flutter valve.  Patient is requesting a new one today.  Patient denies recent antibiotic use. Plan: Prescription for flutter valve provided today, doxycycline, keep follow-up in November/2019  02/05/2018  - Visit   72 year old female presenting today for acute visit.  Patient reporting that her symptoms remain unchanged since starting prednisone taper as well as doxycycline.  Patient reports that sore throat is still present but has slightly improved.  Sore throat was worse yesterday on 02/04/2018.  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  Patient had to go to the emergency room with her husband on 02/02/18.  Patient's sister is now in the area and helping take care of both of them.  Sister is present today office visit.  Patient reports that she did not obtain a new flutter valve until yesterday 02/04/2018.  Patient sister is concerned that the patient is a "busy body" and has significant pressures as the primary caregiver for her husband.  Patient sister reports the patient does not eat very nutritious foods and is also having difficulty resting.  Patient confirms that it is  difficult for her to rest, reports she did take a nap yesterday.  Patient has already received a flu vaccine this season.  Patient is afebrile.  Patient reports she still having a productive cough with yellow mucus.   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  10/22/2016-spirometry-normal ventilatory function  09/13/2016-pulmonary function test-FVC 2.43 (89% predicted), postbronchodilator ratio 76, FEV1 87 >>> Normal spirometry >>>Methacholine challenge test negative  Chart Review:  02/01/2018-patient treated with prednisone taper via telephone encounter   Specialty Problems      Pulmonary Problems   Bronchiectasis without acute exacerbation ([place])    Dr Lamonte Sakai [facility] bronchiectasis 2003  05/31/16-CT chest high-res- worsening areas of bronchiectasis and associated areas of chronic mucoid impaction was 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  07/16/16-AFB negative, fungal negative 07/16/16-BAL-Pseudomonas Aeruginosa, 60,000 colonies       Sinusitis, chronic           ALLERGIC RHINITIS, SEASONAL    Qualifier: Diagnosis of  By: Joya Gaskins MD, Burnett Harry       Cough    Dr Neldon Mc Elevate head on the GERD wedge  05/31/16-CT chest high-res- worsening areas of bronchiectasis and associated areas of chronic mucoid impaction was suggestive of chronic  indolent atypical infectious process such as MAI  10/22/2016-spirometry-normal ventilatory function  09/13/2016-pulmonary function test-FVC 2.43 (89% predicted), postbronchodilator ratio 76, FEV1 87 >>> Normal  spirometry >>>Methacholine challenge test negative         Allergies  Allergen Reactions  . Albuterol Other (See Comments)    Patient states put her into atrial fib last time she took this medication  . Amoxicillin-Pot Clavulanate Diarrhea    Has patient had a PCN reaction causing immediate rash, facial/tongue/throat swelling, SOB or lightheadedness with hypotension:No Has patient had a PCN reaction causing severe rash involving mucus membranes or skin necrosis:No Has patient had a PCN reaction that required hospitalization:No Has patient had a PCN reaction occurring within the last 10 years:Yes If all of the above answers are "NO", then may proceed with Cephalosporin use  . Avelox [Moxifloxacin Hcl In Nacl] Nausea Only  . Bactrim [Sulfamethoxazole-Trimethoprim] Other (See Comments)    flushing  . Benzonatate Other (See Comments)    felt bad, out of sorts, disoriented.  . Ciprofloxacin Rash    Rash across abdomen    Immunization History  Administered Date(s) Administered  . Influenza Split 02/25/2011, 02/05/2012  . Influenza Whole 03/02/2008, 03/02/2009, 02/06/2010  . Influenza, High Dose Seasonal PF 01/28/2017  . Influenza,inj,Quad PF,6+ Mos 02/04/2013, 01/24/2014, 02/02/2015, 02/01/2016, 01/22/2018  . Influenza-Unspecified 01/04/2017  . Pneumococcal Conjugate-13 01/04/2013, 02/16/2013  . Pneumococcal Polysaccharide-23 03/11/2003, 01/16/2009, 09/26/2014  . Td 06/06/2001  . Tdap 07/17/2012  . Zoster 05/07/2007  . Zoster Recombinat (Shingrix) 12/09/2017   Patient is received flu vaccine this season  Past Medical History:  Diagnosis Date  . Adenomatous colon polyp 10/2003  . Allergic rhinitis   . Allergy    SEASONAL  . Bronchiectasis   . Chronic sinusitis   . Diverticulosis   . GERD (gastroesophageal reflux disease)   . Hearing loss   . Hemorrhoids   . Insomnia   . Menopausal disorder   . Osteoporosis   . Palpitations   . TMJ syndrome   . Urticaria      Tobacco History: Social History   Tobacco Use  Smoking Status Never Smoker  Smokeless Tobacco Never Used   Counseling given: Yes  Continue not smoking  Outpatient Encounter Medications as of 02/05/2018  Medication Sig  . acetaminophen (TYLENOL) 500 MG tablet Take 500 mg by mouth every 6 (six) hours as needed for moderate pain or headache.  . ALPRAZolam (XANAX) 0.5 MG tablet Take 1 tablet (0.5 mg total) by mouth once as needed for up to 1 dose for anxiety.  Marland Kitchen CALCIUM-VITAMIN D PO Take 1 tablet by mouth daily.  . Cholecalciferol (VITAMIN D) 1000 UNITS capsule Take 1,000 Units by mouth daily.    Marland Kitchen dextromethorphan (DELSYM) 30 MG/5ML liquid Take 15-30 mLs 2 (two) times daily as needed by mouth for cough.  Marland Kitchen ELIQUIS 5 MG TABS tablet TAKE 1 TABLET TWICE DAILY.  Marland Kitchen Estradiol 10 MCG TABS Place 10 mcg 2 (two) times a week vaginally. Pt does not have set days  . fluticasone (FLONASE) 50 MCG/ACT nasal spray Place 2 sprays into both nostrils at bedtime as needed for allergies or rhinitis.  . Lactobacillus Rhamnosus, GG, (CULTURELLE PO) Take 1 tablet by mouth daily.  Marland Kitchen loratadine (CLARITIN) 10 MG tablet Take 10 mg daily as needed by mouth for allergies.   Marland Kitchen MAGNESIUM PO Take 250 mg by mouth daily.   . metoprolol tartrate (LOPRESSOR) 25 MG tablet Take 0.5 tablets (12.5 mg total) by mouth daily as needed.  Marland Kitchen  Multiple Vitamins-Minerals (CENTRUM SILVER PO) Take 1 tablet by mouth daily.   Marland Kitchen omeprazole (PRILOSEC) 20 MG capsule Take 20 mg by mouth as needed.   Marland Kitchen oxymetazoline (NASAL SPRAY 12 HOUR) 0.05 % nasal spray Nasal Spray 12 Hour PRN  . predniSONE (DELTASONE) 10 MG tablet 10 mg. 77m X5 days, then 129mX5 days.  . Marland KitchenANITIDINE HCL PO Take 100 mg by mouth 2 (two) times daily.   . Marland Kitchenespiratory Therapy Supplies (FLUTTER) DEVI Twice a day and prn as needed, may increase if feeling worse  . [DISCONTINUED] doxycycline (VIBRA-TABS) 100 MG tablet Take 1 tablet (100 mg total) by mouth 2 (two) times daily.   . Marland Kitchenoxycycline (VIBRA-TABS) 100 MG tablet Take 1 tablet (100 mg total) by mouth 2 (two) times daily.   No facility-administered encounter medications on file as of 02/05/2018.      Review of Systems  Review of Systems  Constitutional: Positive for fatigue. Negative for chills, fever and unexpected weight change.  HENT: Positive for congestion. Negative for ear pain, postnasal drip, sinus pressure and sinus pain.   Respiratory: Positive for cough (yellow thick mucous, consistent thoughout exam ) and shortness of breath. Negative for chest tightness and wheezing.   Cardiovascular: Negative for chest pain and palpitations.  Gastrointestinal: Negative for blood in stool, diarrhea, nausea and vomiting.  Genitourinary: Negative for dysuria, frequency and urgency.  Musculoskeletal: Negative for arthralgias.  Skin: Negative for color change.  Allergic/Immunologic: Negative for environmental allergies and food allergies.  Neurological: Negative for dizziness, light-headedness and headaches.  Psychiatric/Behavioral: Negative for dysphoric mood. The patient is not nervous/anxious.   All other systems reviewed and are negative.    Physical Exam  BP 108/64 (BP Location: Left Arm, Cuff Size: Normal)   Pulse (!) 104   Temp 97.7 F (36.5 C) (Oral)   Ht _0  (1.575 m)   Wt 99 lb (44.9 kg)   SpO2 96%   BMI 18.11 kg/m   Wt Readings from Last 5 Encounters:  02/05/18 99 lb (44.9 kg)  01/30/18 99 lb 3.2 oz (45 kg)  12/22/17 100 lb 3.2 oz (45.5 kg)  11/12/17 99 lb (44.9 kg)  09/24/17 100 lb (45.4 kg)    Physical Exam  Constitutional: She is oriented to person, place, and time and well-developed, well-nourished, and in no distress. No distress.  HENT:  Head: Normocephalic and atraumatic.  Right Ear: Hearing and external ear normal.  Left Ear: Hearing, tympanic membrane and external ear normal.  Nose: Nose normal. Right sinus exhibits no maxillary sinus tenderness and no frontal sinus  tenderness. Left sinus exhibits no maxillary sinus tenderness and no frontal sinus tenderness.  Mouth/Throat: Uvula is midline and oropharynx is clear and moist. No oropharyngeal exudate.  + Cerumen in canals bilaterally, difficult to visualize right TM due to cerumen, +post nasal drip   Eyes: Pupils are equal, round, and reactive to light.  Neck: Normal range of motion. Neck supple. No JVD present.  Cardiovascular: Regular rhythm and normal heart sounds. Tachycardia present.  Pulmonary/Chest: Effort normal and breath sounds normal. No accessory muscle usage. No respiratory distress. She has no decreased breath sounds. She has no wheezes. She has no rhonchi.  Abdominal: Soft. Bowel sounds are normal. There is no tenderness.  Musculoskeletal: Normal range of motion. She exhibits no edema.  Lymphadenopathy:    She has no cervical adenopathy.  Neurological: She is alert and oriented to person, place, and time. Gait normal.  Skin: Skin is warm and dry. She  is not diaphoretic. No erythema.  Psychiatric: Mood, memory, affect and judgment normal.  Nursing note and vitals reviewed.     Lab Results:  CBC    Component Value Date/Time   WBC 8.0 11/12/2017 0905   RBC 4.56 11/12/2017 0905   HGB 14.1 11/12/2017 0905   HGB 13.0 09/25/2016 1017   HCT 41.4 11/12/2017 0905   HCT 37.7 09/25/2016 1017   PLT 355.0 11/12/2017 0905   PLT 411 (H) 09/25/2016 1017   MCV 90.9 11/12/2017 0905   MCV 88 09/25/2016 1017   MCH 30.8 03/14/2017 0424   MCHC 34.0 11/12/2017 0905   RDW 13.0 11/12/2017 0905   RDW 12.8 09/25/2016 1017   LYMPHSABS 1.4 11/12/2017 0905   LYMPHSABS 1.5 09/25/2016 1017   MONOABS 0.8 11/12/2017 0905   EOSABS 0.1 11/12/2017 0905   EOSABS 0.2 09/25/2016 1017   BASOSABS 0.1 11/12/2017 0905   BASOSABS 0.0 09/25/2016 1017    BMET    Component Value Date/Time   NA 138 11/12/2017 0905   K 4.3 11/12/2017 0905   CL 101 11/12/2017 0905   CO2 30 11/12/2017 0905   GLUCOSE 71 11/12/2017  0905   BUN 23 11/12/2017 0905   CREATININE 0.86 11/12/2017 0905   CALCIUM 9.5 11/12/2017 0905   GFRNONAA >60 03/14/2017 0424   GFRAA >60 03/14/2017 0424    BNP No results found for: BNP  ProBNP No results found for: PROBNP  Imaging: No results found.    Assessment & Plan:   72 year old female patient presenting today for acute visit.  Will extend patient's doxycycline prescription for 3 more days for a total of 10 days of treatment.  Patient to continue prednisone taper that she started via telephone encounter over the weekend with Dr. Elsworth Soho.  Emphasized the importance the patient continue to use flutter valve regularly.  Patient to rest, adequately hydrate, and ensure she is eating a healthy diet.  We will have patient produce sputum sample so that way we can test to and see what potential bacterial infection may be growing and what sensitivities it may have for treatment.  Bronchiectasis without acute exacerbation ([facility]CC) Doxycycline >>> 1 100 mg tablet every 12 hours for 3 more days, a total 10 days of treatment  >>>take with food  >>>wear sunscreen   Continue Prednisone taper    Bronchiectasis: This is the medical term which indicates that you have damage, dilated airways making you more susceptible to respiratory infection. Use a flutter valve 10 breaths twice a day or 4 to 5 breaths 4-5 times a day to help clear mucus out Let us know if you have cough with change in mucus color or fevers or chills.  At that point you would need an antibiotic. Maintain a healthy nutritious diet, eating whole foods Take your medications as prescribed   We will give the sputum cup today for you to produce a specimen  >>>Produce the specimen tomorrow morning >>>You will need to bring this in tomorrow after you produce it  Keep follow-up with Dr. Lamonte Sakai in November  Make sure you are getting adequate rest and eating nutritious diet   Cough Doxycycline >>> 1 100 mg tablet every 12 hours  for 3 more days >>>take with food  >>>wear sunscreen   Continue Prednisone taper    Bronchiectasis: This is the medical term which indicates that you have damage, dilated airways making you more susceptible to respiratory infection. Use a flutter valve 10 breaths twice a day or 4  to 5 breaths 4-5 times a day to help clear mucus out Let us know if you have cough with change in mucus color or fevers or chills.  At that point you would need an antibiotic. Maintain a healthy nutritious diet, eating whole foods Take your medications as prescribed   We will give the sputum cup today for you to produce a specimen  >>>Produce the specimen tomorrow morning >>>You will need to bring this in tomorrow after you produce it  Keep follow-up with Dr. Lamonte Sakai in November  Make sure you are getting adequate rest and eating nutritious diet   Healthcare maintenance Already received flu vaccine this season      Lauraine Rinne, NP 02/05/2018

## 2018-02-05 ENCOUNTER — Ambulatory Visit (INDEPENDENT_AMBULATORY_CARE_PROVIDER_SITE_OTHER): Payer: Medicare Other | Admitting: Pulmonary Disease

## 2018-02-05 ENCOUNTER — Encounter: Payer: Self-pay | Admitting: Pulmonary Disease

## 2018-02-05 VITALS — BP 108/64 | HR 104 | Temp 97.7°F | Ht 62.0 in | Wt 99.0 lb

## 2018-02-05 DIAGNOSIS — R05 Cough: Secondary | ICD-10-CM | POA: Diagnosis not present

## 2018-02-05 DIAGNOSIS — R059 Cough, unspecified: Secondary | ICD-10-CM

## 2018-02-05 DIAGNOSIS — Z0000 Encounter for general adult medical examination without abnormal findings: Secondary | ICD-10-CM

## 2018-02-05 DIAGNOSIS — J479 Bronchiectasis, uncomplicated: Secondary | ICD-10-CM

## 2018-02-05 MED ORDER — DOXYCYCLINE HYCLATE 100 MG PO TABS: 100.0000 mg | ORAL_TABLET | Freq: Two times a day (BID) | ORAL | 0 refills | Status: DC

## 2018-02-05 NOTE — Assessment & Plan Note (Signed)
Doxycycline >>> 1 100 mg tablet every 12 hours for 3 more days, a total 10 days of treatment  >>>take with food  >>>wear sunscreen   Continue Prednisone taper    Bronchiectasis: This is the medical term which indicates that you have damage, dilated airways making you more susceptible to respiratory infection. Use a flutter valve 10 breaths twice a day or 4 to 5 breaths 4-5 times a day to help clear mucus out Let us know if you have cough with change in mucus color or fevers or chills.  At that point you would need an antibiotic. Maintain a healthy nutritious diet, eating whole foods Take your medications as prescribed   We will give the sputum cup today for you to produce a specimen  >>>Produce the specimen tomorrow morning >>>You will need to bring this in tomorrow after you produce it  Keep follow-up with Dr. Lamonte Sakai in November  Make sure you are getting adequate rest and eating nutritious diet

## 2018-02-05 NOTE — Patient Instructions (Addendum)
Doxycycline >>> 1 100 mg tablet every 12 hours for 3 more days >>>take with food  >>>wear sunscreen   Continue Prednisone taper    Bronchiectasis: This is the medical term which indicates that you have damage, dilated airways making you more susceptible to respiratory infection. Use a flutter valve 10 breaths twice a day or 4 to 5 breaths 4-5 times a day to help clear mucus out Let us know if you have cough with change in mucus color or fevers or chills.  At that point you would need an antibiotic. Maintain a healthy nutritious diet, eating whole foods Take your medications as prescribed   We will give the sputum cup today for you to produce a specimen  >>>Produce the specimen tomorrow morning >>>You will need to bring this in tomorrow after you produce it  Keep follow-up with Dr. Lamonte Sakai in November  Make sure you are getting adequate rest and eating nutritious diet    It is flu season:   >>>Remember to be washing your hands regularly, using hand sanitizer, be careful to use around herself with has contact with people who are sick will increase her chances of getting sick yourself. >>> Best ways to protect herself from the flu: Receive the yearly flu vaccine, practice good hand hygiene washing with soap and also using hand sanitizer when available, eat a nutritious meals, get adequate rest, hydrate appropriately   Please contact the office if your symptoms worsen or you have concerns that you are not improving.   Thank you for choosing [facility] for your healthcare, and for allowing [place] to partner with you on your healthcare journey. I am thankful to be able to provide care to you today.   Wyn Quaker FNP-C  Bronchiectasis Bronchiectasis is a condition in which the airways (bronchi) are damaged and widened. This makes it difficult for the lungs to get rid of mucus. As a result, mucus gathers in the airways, and this often leads to lung infections. Infection can cause  inflammation in the airways, which may further weaken and damage the bronchi. What are the causes? Bronchiectasis may be present at birth (congenital) or may develop later in life. Sometimes there is no apparent cause. Some common causes include:  Cystic fibrosis.  Recurrent lung infections (such as pneumonia, tuberculosis, or fungal infections).  Foreign bodies or other blockages in the lungs.  Breathing in fluid, food, or other foreign objects (aspiration).  What are the signs or symptoms? Common symptoms include:  A daily cough that brings up mucus and lasts for more than 3 weeks.  Frequent lung infections (such as pneumonia, tuberculosis, or fungal infections).  Shortness of breath and wheezing.  Weakness and fatigue.  How is this diagnosed? Various tests may be done to help diagnose bronchiectasis. Tests may include:  Chest X-rays or CT scans.  Breathing tests to help determine how your lungs are working.  Sputum cultures to check for infection.  Blood tests and other tests to check for related diseases or causes, such as cystic fibrosis.  How is this treated? Treatment varies depending on the severity of the condition. Medicines may be given to loosen the mucus to be coughed up (expectorants), to relax the muscles of the air passages (bronchodilators), or to prevent or treat infections (antibiotics). Physical therapy methods may be recommended to help clear mucus from the lungs. For severe cases, surgery may be done to remove the affected part of the lung. Follow these instructions at home:  Get  plenty of rest.  Only take over-the-counter or prescription medicines as directed by your health care provider. If antibiotic medicines were prescribed, take them as directed. Finish them even if you start to feel better.  Avoid sedatives and antihistamines unless otherwise directed by your health care provider. These medicines tend to thicken the mucus in the lungs.  Perform  any breathing exercises or techniques to clear the lungs as directed by your health care provider.  Drink enough fluids to keep your urine clear or pale yellow.  Consider using a cold steam vaporizer or humidifier in your room or home to help loosen secretions.  If the cough is worse at night, try sleeping in a semi-upright position in a recliner or using a couple of pillows.  Avoid cigarette smoke and lung irritants. If you smoke, quit.  Stay inside when pollution and ozone levels are high.  Stay current with vaccinations and immunizations.  Follow up with your health care provider as directed. Contact a health care provider if:  You cough up more thick, discolored mucus (sputum) that is yellow to green in color.  You have a fever or persistent symptoms for more than 2-3 days.  You cannot control your cough and are losing sleep. Get help right away if:  You cough up blood.  You have chest pain or increasing shortness of breath.  You have pain that is getting worse or is uncontrolled with medicines.  You have a fever and your symptoms suddenly get worse. This information is not intended to replace advice given to you by your health care provider. Make sure you discuss any questions you have with your health care provider. Document Released: 02/17/2007 Document Revised: 10/04/2015 Document Reviewed: 10/28/2012 Elsevier Interactive Patient Education  2017 [employer].

## 2018-02-05 NOTE — Assessment & Plan Note (Signed)
Doxycycline >>> 1 100 mg tablet every 12 hours for 3 more days >>>take with food  >>>wear sunscreen   Continue Prednisone taper    Bronchiectasis: This is the medical term which indicates that you have damage, dilated airways making you more susceptible to respiratory infection. Use a flutter valve 10 breaths twice a day or 4 to 5 breaths 4-5 times a day to help clear mucus out Let us know if you have cough with change in mucus color or fevers or chills.  At that point you would need an antibiotic. Maintain a healthy nutritious diet, eating whole foods Take your medications as prescribed   We will give the sputum cup today for you to produce a specimen  >>>Produce the specimen tomorrow morning >>>You will need to bring this in tomorrow after you produce it  Keep follow-up with Dr. Lamonte Sakai in November  Make sure you are getting adequate rest and eating nutritious diet

## 2018-02-05 NOTE — Assessment & Plan Note (Signed)
Already received flu vaccine this season

## 2018-02-06 ENCOUNTER — Other Ambulatory Visit: Payer: Medicare Other

## 2018-02-06 DIAGNOSIS — J479 Bronchiectasis, uncomplicated: Secondary | ICD-10-CM | POA: Diagnosis not present

## 2018-02-10 ENCOUNTER — Ambulatory Visit (INDEPENDENT_AMBULATORY_CARE_PROVIDER_SITE_OTHER): Payer: Medicare Other | Admitting: Pulmonary Disease

## 2018-02-10 ENCOUNTER — Encounter: Payer: Self-pay | Admitting: Pulmonary Disease

## 2018-02-10 VITALS — BP 112/60 | HR 84 | Temp 97.8°F | Ht 61.75 in | Wt 97.6 lb

## 2018-02-10 DIAGNOSIS — R002 Palpitations: Secondary | ICD-10-CM

## 2018-02-10 DIAGNOSIS — J069 Acute upper respiratory infection, unspecified: Secondary | ICD-10-CM

## 2018-02-10 DIAGNOSIS — J479 Bronchiectasis, uncomplicated: Secondary | ICD-10-CM

## 2018-02-10 DIAGNOSIS — B370 Candidal stomatitis: Secondary | ICD-10-CM | POA: Insufficient documentation

## 2018-02-10 MED ORDER — NYSTATIN 100000 UNIT/ML MT SUSP: 5.0000 mL | Freq: Four times a day (QID) | OROMUCOSAL | 0 refills | Status: DC

## 2018-02-10 MED ORDER — SODIUM CHLORIDE 3 % IN NEBU: INHALATION_SOLUTION | Freq: Two times a day (BID) | RESPIRATORY_TRACT | 5 refills | Status: DC

## 2018-02-10 NOTE — Assessment & Plan Note (Signed)
Patient needs to contact cardiology to let them know that she is been having increased palpitations.  Patient has been on a record these via her iPad app that she is used in the past.  Patient has been taking her metoprolol prophylactically.  As previously instructed by cardiology.  I suggested the patient today with a follow-up with cardiology and let them know that symptoms have worsened since the last office visit 1 month ago.

## 2018-02-10 NOTE — Assessment & Plan Note (Signed)
   Gargle warm saltwater or take cough drops to comfort your throat as told by your doctor.  Use a warm mist humidifier or inhale steam from a shower to increase air moisture. This may make it easier to breathe.  Drink enough fluid to keep your pee (urine) clear or pale yellow.  Eat soups and other clear broths.  Have a healthy diet.  Rest as needed.

## 2018-02-10 NOTE — Patient Instructions (Addendum)
Will send prescription to hypertonic saline nebs that you can use twice daily prior to flutter valve use  Increase Mucinex to 1200 mg Mucinex twice daily  Use Flutter valve regularly   Bronchiectasis: This is the medical term which indicates that you have damage, dilated airways making you more susceptible to respiratory infection. Use a flutter valve 10 breaths twice a day or 4 to 5 breaths 4-5 times a day to help clear mucus out Let us know if you have cough with change in mucus color or fevers or chills.  At that point you would need an antibiotic. Maintain a healthy nutritious diet, eating whole foods Take your medications as prescribed     It is flu season:   >>>Remember to be washing your hands regularly, using hand sanitizer, be careful to use around herself with has contact with people who are sick will increase her chances of getting sick yourself. >>> Best ways to protect herself from the flu: Receive the yearly flu vaccine, practice good hand hygiene washing with soap and also using hand sanitizer when available, eat a nutritious meals, get adequate rest, hydrate appropriately   Please contact the office if your symptoms worsen or you have concerns that you are not improving.   Thank you for choosing [facility] for your healthcare, and for allowing [place] to partner with you on your healthcare journey. I am thankful to be able to provide care to you today.   Wyn Quaker FNP-C      Upper Respiratory Infection, Adult Most upper respiratory infections (URIs) are caused by a virus. A URI affects the nose, throat, and upper air passages. The most common type of URI is often called "the common cold." Follow these instructions at home:  Take medicines only as told by your doctor.  Gargle warm saltwater or take cough drops to comfort your throat as told by your doctor.  Use a warm mist humidifier or inhale steam from a shower to increase air moisture. This may make it  easier to breathe.  Drink enough fluid to keep your pee (urine) clear or pale yellow.  Eat soups and other clear broths.  Have a healthy diet.  Rest as needed.  Go back to work when your fever is gone or your doctor says it is okay. ? You may need to stay home longer to avoid giving your URI to others. ? You can also wear a face mask and wash your hands often to prevent spread of the virus.  Use your inhaler more if you have asthma.  Do not use any tobacco products, including cigarettes, chewing tobacco, or electronic cigarettes. If you need help quitting, ask your doctor. Contact a doctor if:  You are getting worse, not better.  Your symptoms are not helped by medicine.  You have chills.  You are getting more short of breath.  You have brown or red mucus.  You have yellow or brown discharge from your nose.  You have pain in your face, especially when you bend forward.  You have a fever.  You have puffy (swollen) neck glands.  You have pain while swallowing.  You have white areas in the back of your throat. Get help right away if:  You have very bad or constant: ? Headache. ? Ear pain. ? Pain in your forehead, behind your eyes, and over your cheekbones (sinus pain). ? Chest pain.  You have long-lasting (chronic) lung disease and any of the following: ? Wheezing. ?  Long-lasting cough. ? Coughing up blood. ? A change in your usual mucus.  You have a stiff neck.  You have changes in your: ? Vision. ? Hearing. ? Thinking. ? Mood. This information is not intended to replace advice given to you by your health care provider. Make sure you discuss any questions you have with your health care provider. Document Released: 10/09/2007 Document Revised: 12/24/2015 Document Reviewed: 07/28/2013 Elsevier Interactive Patient Education  2018 [employer].

## 2018-02-10 NOTE — Assessment & Plan Note (Signed)
Potential thrush on exam today  Patient with recent antibiotic use.  Will add nystatin rinse

## 2018-02-10 NOTE — Progress Notes (Signed)
_0  ID: Monica [name], female    DOB: 02/09/1946, 72 y.o.   MRN: [id number]  Chief Complaint  Patient presents with  . Acute Visit    Referring provider: Plotnikov, Evie Lacks, MD  HPI:  72 year old female patient followed in our office for bronchiectasis, allergic rhinitis  PMH: GERD, A. fib (on Eliquis) Smoker/ Smoking History: Non-smoker Maintenance: None Pt of: [name]  02/10/2018  - Visit   72 year old female patient presenting today for acute visit.  Patient has been having ongoing symptoms for the last 2 to 3 weeks.  Patient reports that her cough has slightly improved.  As well as sputum color has improved to a light yellow to a tan.  Patient has not been using her flutter valve regularly for the past week.  Patient reports she is been hydrating well unfortunately patient is been unable to rest adequately.  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  Patient had her sister in town who was able to help initially but then the sister became sick the patient had to care for her sister as well as her husband.  Patient reports that she is been struggling to get adequate rest.  Has been difficult to nap.  Patient has concerns that she may have similar sickness to her sister.  Patient reports adherence to Flonase, Mucinex, flutter valve, omeprazole, loratadine.  Patient is on her last day of prednisone from telephone encounter last week.  Patient would like to not take her prednisone if exam is okay.  Of note, patient's preliminary sputum sample that she provided last week is showing normal oral pharyngeal growth, no fungal growth at this time.    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  07/16/16-AFB negative, fungal negative 07/16/16-BAL-Pseudomonas Aeruginosa, 60,000 colonies       Sinusitis, chronic           ALLERGIC RHINITIS, SEASONAL    Qualifier: Diagnosis of  By: Joya Gaskins MD, Burnett Harry       Cough    Dr Neldon Mc Elevate head on the GERD wedge  05/31/16-CT chest high-res- worsening areas of bronchiectasis and associated areas of chronic mucoid impaction was suggestive of chronic  indolent atypical infectious process such as MAI  10/22/2016-spirometry-normal ventilatory function  09/13/2016-pulmonary function test-FVC 2.43 (89% predicted), postbronchodilator ratio 76, FEV1 87 >>> Normal spirometry >>>Methacholine challenge test negative      Upper respiratory infection, viral      Allergies  Allergen Reactions  . Albuterol Other (See  Comments)    Patient states put her into atrial fib last time she took this medication  . Amoxicillin-Pot Clavulanate Diarrhea    Has patient had a PCN reaction causing immediate rash, facial/tongue/throat swelling, SOB or lightheadedness with hypotension:No Has patient had a PCN reaction causing severe rash involving mucus membranes or skin necrosis:No Has patient had a PCN reaction that required hospitalization:No Has patient had a PCN reaction occurring within the last 10 years:Yes If all of the above answers are "NO", then may proceed with Cephalosporin use  . Avelox [Moxifloxacin Hcl In Nacl] Nausea Only  . Bactrim [Sulfamethoxazole-Trimethoprim] Other (See Comments)    flushing  . Benzonatate Other (See Comments)    felt bad, out of sorts, disoriented.  . Ciprofloxacin Rash    Rash across abdomen    Immunization History  Administered Date(s) Administered  . Influenza Split 02/25/2011, 02/05/2012  . Influenza Whole 03/02/2008, 03/02/2009, 02/06/2010  . Influenza, High Dose Seasonal PF 01/28/2017  . Influenza,inj,Quad PF,6+ Mos 02/04/2013, 01/24/2014, 02/02/2015, 02/01/2016, 01/22/2018  . Influenza-Unspecified 01/04/2017  . Pneumococcal Conjugate-13 01/04/2013, 02/16/2013  . Pneumococcal Polysaccharide-23 03/11/2003, 01/16/2009, 09/26/2014  . Td 06/06/2001  . Tdap 07/17/2012  . Zoster 05/07/2007  . Zoster Recombinat (Shingrix) 12/09/2017    Past Medical History:  Diagnosis Date  . Adenomatous colon polyp 10/2003  . Allergic rhinitis   . Allergy    SEASONAL  . Bronchiectasis   . Chronic sinusitis   . Diverticulosis   . GERD (gastroesophageal reflux disease)   . Hearing loss   . Hemorrhoids   . Insomnia   . Menopausal disorder   . Osteoporosis   . Palpitations   . TMJ syndrome   . Urticaria     Tobacco History: Social History   Tobacco Use  Smoking Status Never Smoker  Smokeless Tobacco Never Used   Counseling given: Not Answered  Continue to not  smoke.  Outpatient Encounter Medications as of 02/10/2018  Medication Sig  . acetaminophen (TYLENOL) 500 MG tablet Take 500 mg by mouth every 6 (six) hours as needed for moderate pain or headache.  . ALPRAZolam (XANAX) 0.5 MG tablet Take 1 tablet (0.5 mg total) by mouth once as needed for up to 1 dose for anxiety.  Marland Kitchen CALCIUM-VITAMIN D PO Take 1 tablet by mouth daily.  . Cholecalciferol (VITAMIN D) 1000 UNITS capsule Take 1,000 Units by mouth daily.    Marland Kitchen dextromethorphan (DELSYM) 30 MG/5ML liquid Take 15-30 mLs 2 (two) times daily as needed by mouth for cough.  Marland Kitchen ELIQUIS 5 MG TABS tablet TAKE 1 TABLET TWICE DAILY.  Marland Kitchen Estradiol 10 MCG TABS Place 10 mcg 2 (two) times a week vaginally. Pt does not have set days  . fluticasone (FLONASE) 50 MCG/ACT nasal spray Place 2 sprays into both nostrils at bedtime as needed for allergies or rhinitis.  Marland Kitchen guaiFENesin (MUCINEX) 600 MG 12 hr tablet Take 1,200 mg by mouth 2 (two) times daily.  . Lactobacillus Rhamnosus, GG, (CULTURELLE PO) Take 1 tablet by mouth daily.  Marland Kitchen MAGNESIUM PO Take 250 mg by mouth daily.   . metoprolol tartrate (LOPRESSOR) 25 MG tablet Take 0.5 tablets (12.5 mg total) by mouth daily as  needed.  . Multiple Vitamins-Minerals (CENTRUM SILVER PO) Take 1 tablet by mouth daily.   Marland Kitchen omeprazole (PRILOSEC) 20 MG capsule Take 20 mg by mouth as needed.   Marland Kitchen oxymetazoline (NASAL SPRAY 12 HOUR) 0.05 % nasal spray Nasal Spray 12 Hour PRN  . predniSONE (DELTASONE) 10 MG tablet 10 mg. 60m X5 days, then 161mX5 days.  . Marland KitchenANITIDINE HCL PO Take 100 mg by mouth 2 (two) times daily.   . Marland Kitchenespiratory Therapy Supplies (FLUTTER) DEVI Twice a day and prn as needed, may increase if feeling worse  . doxycycline (VIBRA-TABS) 100 MG tablet Take 1 tablet (100 mg total) by mouth 2 (two) times daily. (Patient not taking: Reported on 02/10/2018)  . loratadine (CLARITIN) 10 MG tablet Take 10 mg daily as needed by mouth for allergies.   . Marland Kitchenystatin (MYCOSTATIN) 100000 UNIT/ML  suspension Take 5 mLs (500,000 Units total) by mouth 4 (four) times daily.  . sodium chloride HYPERTONIC 3 % nebulizer solution Take by nebulization 2 (two) times daily.   No facility-administered encounter medications on file as of 02/10/2018.      Review of Systems  Review of Systems  Constitutional: Positive for fatigue. Negative for chills, fever and unexpected weight change.  HENT: Positive for postnasal drip and sore throat. Negative for congestion, ear pain, sinus pressure and sinus pain.   Respiratory: Positive for cough (productive pale yellow, light tan ). Negative for chest tightness, shortness of breath and wheezing.   Cardiovascular: Positive for palpitations (occasionally ). Negative for chest pain.  Gastrointestinal: Negative for blood in stool, diarrhea, nausea and vomiting.  Genitourinary: Negative for dysuria, frequency and urgency.  Musculoskeletal: Negative for arthralgias.  Skin: Negative for color change.  Allergic/Immunologic: Negative for environmental allergies and food allergies.  Neurological: Negative for dizziness, light-headedness and headaches.  Psychiatric/Behavioral: Negative for dysphoric mood. The patient is not nervous/anxious.   All other systems reviewed and are negative.    Physical Exam  BP 112/60 (BP Location: Left Arm, Cuff Size: Normal)   Pulse 84   Temp 97.8 F (36.6 C) (Oral)   Ht 5' 1.75" (1.568 m)   Wt 97 lb 9.6 oz (44.3 kg)   SpO2 98%   BMI 18.00 kg/m   Wt Readings from Last 5 Encounters:  02/10/18 97 lb 9.6 oz (44.3 kg)  02/05/18 99 lb (44.9 kg)  01/30/18 99 lb 3.2 oz (45 kg)  12/22/17 100 lb 3.2 oz (45.5 kg)  11/12/17 99 lb (44.9 kg)    Physical Exam  Constitutional: She is oriented to person, place, and time and well-developed, well-nourished, and in no distress. Vital signs are normal. No distress.  HENT:  Head: Normocephalic and atraumatic.  Right Ear: Hearing, tympanic membrane, external ear and ear canal normal.    Left Ear: Hearing, tympanic membrane, external ear and ear canal normal.  Nose: Nose normal. Right sinus exhibits no maxillary sinus tenderness and no frontal sinus tenderness. Left sinus exhibits no maxillary sinus tenderness and no frontal sinus tenderness.  Mouth/Throat: Uvula is midline. Posterior oropharyngeal erythema (Minor) present. No oropharyngeal exudate.  + Postnasal drip  Eyes: Pupils are equal, round, and reactive to light.  Neck: Normal range of motion. Neck supple. No JVD present.  Cardiovascular: Normal rate, regular rhythm, S1 normal, S2 normal and normal heart sounds.  No noticeable arrhythmia on exam.  Pulmonary/Chest: Effort normal and breath sounds normal. No accessory muscle usage. No respiratory distress. She has no decreased breath sounds. She has no wheezes. She has  no rhonchi. She has no rales.  Abdominal: Soft. Bowel sounds are normal. There is no tenderness.  Musculoskeletal: Normal range of motion. She exhibits no edema.  Lymphadenopathy:    She has no cervical adenopathy.  Neurological: She is alert and oriented to person, place, and time. Gait normal.  Skin: Skin is warm and dry. She is not diaphoretic. No erythema.  Psychiatric: Memory, affect and judgment normal. Her mood appears anxious.  Nursing note and vitals reviewed.     Lab Results:  CBC    Component Value Date/Time   WBC 8.0 11/12/2017 0905   RBC 4.56 11/12/2017 0905   HGB 14.1 11/12/2017 0905   HGB 13.0 09/25/2016 1017   HCT 41.4 11/12/2017 0905   HCT 37.7 09/25/2016 1017   PLT 355.0 11/12/2017 0905   PLT 411 (H) 09/25/2016 1017   MCV 90.9 11/12/2017 0905   MCV 88 09/25/2016 1017   MCH 30.8 03/14/2017 0424   MCHC 34.0 11/12/2017 0905   RDW 13.0 11/12/2017 0905   RDW 12.8 09/25/2016 1017   LYMPHSABS 1.4 11/12/2017 0905   LYMPHSABS 1.5 09/25/2016 1017   MONOABS 0.8 11/12/2017 0905   EOSABS 0.1 11/12/2017 0905   EOSABS 0.2 09/25/2016 1017   BASOSABS 0.1 11/12/2017 0905   BASOSABS  0.0 09/25/2016 1017    BMET    Component Value Date/Time   NA 138 11/12/2017 0905   K 4.3 11/12/2017 0905   CL 101 11/12/2017 0905   CO2 30 11/12/2017 0905   GLUCOSE 71 11/12/2017 0905   BUN 23 11/12/2017 0905   CREATININE 0.86 11/12/2017 0905   CALCIUM 9.5 11/12/2017 0905   GFRNONAA >60 03/14/2017 0424   GFRAA >60 03/14/2017 0424    BNP No results found for: BNP  ProBNP No results found for: PROBNP    Assessment & Plan:   This is a pleasant 72 year old patient seen for acute visit today.  Unfortunately I believe the patient has developed an upper respiratory infection probably from her known sick contact of her sister.  I do not see an indication or need for antibiotics at this time.  Will add hypertonic saline nebs and increase Mucinex for patient to be able to help mobilize secretions.  I emphasized the importance of the patient continues to rest adequately and make sure she is hydrating well.  Unfortunately with patient's known bronchiectasis I explained to the patient that it will be difficult for her to adequately recover from recent exacerbation without rest and adherence to her treatment plan.  Patient has only been adherent to using her flutter valve for the past week.  Preliminary reports from sputum sample from last week showed normal oral pharyngeal growth.  We will continue to monitor.  Will give patient a round of nystatin rinse for the potential thrush in the back of her throat.   Bronchiectasis without acute exacerbation ([place]) Will send prescription to hypertonic saline nebs that you can use twice daily prior to flutter valve use  Increase Mucinex to 1200 mg Mucinex twice daily  Use Flutter valve regularly   Bronchiectasis: This is the medical term which indicates that you have damage, dilated airways making you more susceptible to respiratory infection. Use a flutter valve 10 breaths twice a day or 4 to 5 breaths 4-5 times a day to help clear mucus  out Let us know if you have cough with change in mucus color or fevers or chills.  At that point you would need an antibiotic. Maintain a healthy  nutritious diet, eating whole foods Take your medications as prescribed   Thrush, oral Potential thrush on exam today  Patient with recent antibiotic use.  Will add nystatin rinse  PALPITATIONS Patient needs to contact cardiology to let them know that she is been having increased palpitations.  Patient has been on a record these via her iPad app that she is used in the past.  Patient has been taking her metoprolol prophylactically.  As previously instructed by cardiology.  I suggested the patient today with a follow-up with cardiology and let them know that symptoms have worsened since the last office visit 1 month ago.  Upper respiratory infection, viral  Gargle warm saltwater or take cough drops to comfort your throat as told by your doctor.  Use a warm mist humidifier or inhale steam from a shower to increase air moisture. This may make it easier to breathe.  Drink enough fluid to keep your pee (urine) clear or pale yellow.  Eat soups and other clear broths.  Have a healthy diet.  Rest as needed.   This appointment was 27 minutes along with her 50% that time in direct face-to-face patient care, assessment, plan of care follow-up.  Lauraine Rinne, NP 02/10/2018

## 2018-02-10 NOTE — Assessment & Plan Note (Signed)
Will send prescription to hypertonic saline nebs that you can use twice daily prior to flutter valve use  Increase Mucinex to 1200 mg Mucinex twice daily  Use Flutter valve regularly   Bronchiectasis: This is the medical term which indicates that you have damage, dilated airways making you more susceptible to respiratory infection. Use a flutter valve 10 breaths twice a day or 4 to 5 breaths 4-5 times a day to help clear mucus out Let us know if you have cough with change in mucus color or fevers or chills.  At that point you would need an antibiotic. Maintain a healthy nutritious diet, eating whole foods Take your medications as prescribed

## 2018-02-11 NOTE — Addendum Note (Signed)
Addended by: Amado Coe on: 02/11/2018 08:56 AM   Modules accepted: Orders

## 2018-03-02 NOTE — Progress Notes (Signed)
Sputum culture results of come back.  Showing known Pseudomonas.  Continue plan of care.  Continue with flutter valve use.  Continue with management of bronchiectasis.  Check with patient ensure symptoms are improving since last office visit.  If symptoms are stable or improved and we can hold off on further therapies.  Keep follow-up with Dr. Lamonte Sakai in November/2019.  I have discussed these results with him.  Wyn Quaker, FNP

## 2018-03-04 ENCOUNTER — Other Ambulatory Visit: Payer: Self-pay | Admitting: Cardiovascular Disease

## 2018-03-04 NOTE — Telephone Encounter (Signed)
Rx request sent to pharmacy.  

## 2018-03-09 ENCOUNTER — Encounter: Payer: Self-pay | Admitting: Internal Medicine

## 2018-03-09 ENCOUNTER — Ambulatory Visit (INDEPENDENT_AMBULATORY_CARE_PROVIDER_SITE_OTHER): Payer: Medicare Other | Admitting: Internal Medicine

## 2018-03-09 VITALS — BP 108/62 | HR 72 | Temp 98.2°F | Ht 61.75 in | Wt 99.0 lb

## 2018-03-09 DIAGNOSIS — R634 Abnormal weight loss: Secondary | ICD-10-CM

## 2018-03-09 DIAGNOSIS — F4322 Adjustment disorder with anxiety: Secondary | ICD-10-CM

## 2018-03-09 DIAGNOSIS — I480 Paroxysmal atrial fibrillation: Secondary | ICD-10-CM

## 2018-03-09 DIAGNOSIS — G4700 Insomnia, unspecified: Secondary | ICD-10-CM | POA: Diagnosis not present

## 2018-03-09 DIAGNOSIS — E785 Hyperlipidemia, unspecified: Secondary | ICD-10-CM | POA: Diagnosis not present

## 2018-03-09 DIAGNOSIS — J479 Bronchiectasis, uncomplicated: Secondary | ICD-10-CM | POA: Diagnosis not present

## 2018-03-09 NOTE — Assessment & Plan Note (Signed)
ASA

## 2018-03-09 NOTE — Progress Notes (Signed)
Subjective:  Patient ID: Monica [name], female    DOB: 12-Apr-1946  Age: 72 y.o. MRN: [id number]  CC: No chief complaint on file.   HPI Monica [name] presents for bronchiectases, A fib, anxiety  Outpatient Medications Prior to Visit  Medication Sig Dispense Refill  . acetaminophen (TYLENOL) 500 MG tablet Take 500 mg by mouth every 6 (six) hours as needed for moderate pain or headache.    Marland Kitchen CALCIUM-VITAMIN D PO Take 1 tablet by mouth daily.    . Cholecalciferol (VITAMIN D) 1000 UNITS capsule Take 1,000 Units by mouth daily.      Marland Kitchen dextromethorphan (DELSYM) 30 MG/5ML liquid Take 15-30 mLs 2 (two) times daily as needed by mouth for cough.    Marland Kitchen ELIQUIS 5 MG TABS tablet TAKE 1 TABLET TWICE A DAY 180 tablet 4  . Estradiol 10 MCG TABS Place 10 mcg 2 (two) times a week vaginally. Pt does not have set days    . fluticasone (FLONASE) 50 MCG/ACT nasal spray Place 2 sprays into both nostrils at bedtime as needed for allergies or rhinitis.    Marland Kitchen guaiFENesin (MUCINEX) 600 MG 12 hr tablet Take 1,200 mg by mouth 2 (two) times daily.    . Lactobacillus Rhamnosus, GG, (CULTURELLE PO) Take 1 tablet by mouth daily.    Marland Kitchen loratadine (CLARITIN) 10 MG tablet Take 10 mg daily as needed by mouth for allergies.     Marland Kitchen MAGNESIUM PO Take 250 mg by mouth daily.     . metoprolol tartrate (LOPRESSOR) 25 MG tablet Take 0.5 tablets (12.5 mg total) by mouth daily as needed. 30 tablet 5  . Multiple Vitamins-Minerals (CENTRUM SILVER PO) Take 1 tablet by mouth daily.     Marland Kitchen omeprazole (PRILOSEC) 20 MG capsule Take 20 mg by mouth as needed.     Marland Kitchen RANITIDINE HCL PO Take 100 mg by mouth 2 (two) times daily.     Marland Kitchen Respiratory Therapy Supplies (FLUTTER) DEVI Twice a day and prn as needed, may increase if feeling worse 1 each 0  . sodium chloride HYPERTONIC 3 % nebulizer solution Take by nebulization 2 (two) times daily. 320 mL 5  . ALPRAZolam (XANAX) 0.5 MG tablet Take 1 tablet (0.5 mg total) by mouth once as needed for up to 1  dose for anxiety. 15 tablet 0  . doxycycline (VIBRA-TABS) 100 MG tablet Take 1 tablet (100 mg total) by mouth 2 (two) times daily. (Patient not taking: Reported on 02/10/2018) 6 tablet 0  . nystatin (MYCOSTATIN) 100000 UNIT/ML suspension Take 5 mLs (500,000 Units total) by mouth 4 (four) times daily. 60 mL 0  . oxymetazoline (NASAL SPRAY 12 HOUR) 0.05 % nasal spray Nasal Spray 12 Hour PRN    . predniSONE (DELTASONE) 10 MG tablet 10 mg. 20mg  X5 days, then 10mg  X5 days.     No facility-administered medications prior to visit.     ROS: Review of Systems  Constitutional: Negative for activity change, appetite change, chills, fatigue and unexpected weight change.  HENT: Negative for congestion, mouth sores and sinus pressure.   Eyes: Negative for visual disturbance.  Respiratory: Negative for cough and chest tightness.   Gastrointestinal: Negative for abdominal pain and nausea.  Genitourinary: Negative for difficulty urinating, frequency and vaginal pain.  Musculoskeletal: Negative for back pain and gait problem.  Skin: Negative for pallor and rash.  Neurological: Negative for dizziness, tremors, weakness, numbness and headaches.  Psychiatric/Behavioral: Negative for confusion, sleep disturbance and suicidal ideas.  Objective:  BP 108/62 (BP Location: Right Arm, Patient Position: Sitting, Cuff Size: Normal)   Pulse 72   Temp 98.2 F (36.8 C) (Oral)   Ht 5' 1.75" (1.568 m)   Wt 99 lb (44.9 kg)   SpO2 99%   BMI 18.25 kg/m   BP Readings from Last 3 Encounters:  03/09/18 108/62  02/10/18 112/60  02/05/18 108/64    Wt Readings from Last 3 Encounters:  03/09/18 99 lb (44.9 kg)  02/10/18 97 lb 9.6 oz (44.3 kg)  02/05/18 99 lb (44.9 kg)    Physical Exam  Constitutional: She appears well-developed. No distress.  HENT:  Head: Normocephalic.  Right Ear: External ear normal.  Left Ear: External ear normal.  Nose: Nose normal.  Mouth/Throat: Oropharynx is clear and moist.  Eyes:  Pupils are equal, round, and reactive to light. Conjunctivae are normal. Right eye exhibits no discharge. Left eye exhibits no discharge.  Neck: Normal range of motion. Neck supple. No JVD present. No tracheal deviation present. No thyromegaly present.  Cardiovascular: Normal rate, regular rhythm and normal heart sounds.  Pulmonary/Chest: No stridor. No respiratory distress. She has no wheezes.  Abdominal: Soft. Bowel sounds are normal. She exhibits no distension and no mass. There is no tenderness. There is no rebound and no guarding.  Musculoskeletal: She exhibits no edema or tenderness.  Lymphadenopathy:    She has no cervical adenopathy.  Neurological: She displays normal reflexes. No cranial nerve deficit. She exhibits normal muscle tone. Coordination normal.  Skin: No rash noted. No erythema.  Psychiatric: She has a normal mood and affect. Her behavior is normal. Judgment and thought content normal.  thin   Lab Results  Component Value Date   WBC 8.0 11/12/2017   HGB 14.1 11/12/2017   HCT 41.4 11/12/2017   PLT 355.0 11/12/2017   GLUCOSE 71 11/12/2017   CHOL 157 04/13/2014   TRIG 71.0 04/13/2014   HDL 65.60 04/13/2014   LDLCALC 77 04/13/2014   ALT 17 09/28/2014   AST 28 09/28/2014   NA 138 11/12/2017   K 4.3 11/12/2017   CL 101 11/12/2017   CREATININE 0.86 11/12/2017   BUN 23 11/12/2017   CO2 30 11/12/2017   TSH 4.381 09/17/2015    Dg Chest 2 View  Result Date: 08/04/2017 CLINICAL DATA:  72 year old female with persistent cough. Chronic bronchiectasis and mucoid impaction. EXAM: CHEST - 2 VIEW COMPARISON:  07/28/2017 and earlier, including high-resolution chest CT 05/31/2016. FINDINGS: Chronic large lung volumes with increased AP dimension to the chest. Chronic lower lobe and right middle lobe predominant bronchiectasis demonstrated on the 2018 CT. No pneumothorax, pulmonary edema or pleural effusion. Middle and lower lobe irregular peribronchial opacity appears not  significantly changed since November 2018. Similar apical scarring greater on the right. No acute pulmonary opacity identified. No acute osseous abnormality identified. Negative visible bowel gas pattern. IMPRESSION: Chronic lung disease including hyperinflation and Bronchiectasis with no superimposed acute findings identified. Electronically Signed   By: Genevie Ann M.D.   On: 08/04/2017 11:26    Assessment & Plan:   There are no diagnoses linked to this encounter.   No orders of the defined types were placed in this encounter.    Follow-up: No follow-ups on file.  Walker Kehr, MD

## 2018-03-09 NOTE — Assessment & Plan Note (Signed)
Wt Readings from Last 3 Encounters:  03/09/18 99 lb (44.9 kg)  02/10/18 97 lb 9.6 oz (44.3 kg)  02/05/18 99 lb (44.9 kg)

## 2018-03-09 NOTE — Assessment & Plan Note (Signed)
Pseudomonas cx (+) - treated

## 2018-03-09 NOTE — Assessment & Plan Note (Addendum)
Xanax prn - not taking  Potential benefits of a long term benzodiazepines  use as well as potential risks  and complications were explained to the patient and were aknowledged. Valerian root

## 2018-03-09 NOTE — Assessment & Plan Note (Signed)
Valerian root 300-600 mg at night

## 2018-03-18 ENCOUNTER — Ambulatory Visit (INDEPENDENT_AMBULATORY_CARE_PROVIDER_SITE_OTHER): Payer: Medicare Other | Admitting: Emergency Medicine

## 2018-03-18 ENCOUNTER — Encounter: Payer: Self-pay | Admitting: Emergency Medicine

## 2018-03-18 DIAGNOSIS — K219 Gastro-esophageal reflux disease without esophagitis: Secondary | ICD-10-CM

## 2018-03-18 DIAGNOSIS — J479 Bronchiectasis, uncomplicated: Secondary | ICD-10-CM

## 2018-03-18 DIAGNOSIS — J301 Allergic rhinitis due to pollen: Secondary | ICD-10-CM | POA: Diagnosis not present

## 2018-03-18 NOTE — Assessment & Plan Note (Addendum)
But with a recent flare that required doxycycline, prednisone.  Sputum negative for AFB.  She is improved and is now back to baseline.  She is going to use her flutter valve at least once a day.  Increase the frequency when she flares.  We can change her 3% saline nebulizers to as needed.  Continue to aggressively control her GERD, allergic rhinitis which contribute to her chronic dry cough.   Keep your flutter valve available and use it once daily. You can stop doing 3% saline nebulizer treatments on a schedule.  Keep it available so that we can use it as needed for mucus clearance. Chest x-ray at your next visit. Flu shot up-to-date Pneumonia vaccine up-to-date

## 2018-03-18 NOTE — Assessment & Plan Note (Signed)
Continue your omeprazole as needed. Check with your pharmacy to ensure that your ranitidine was not part of the national recall.

## 2018-03-18 NOTE — Progress Notes (Signed)
Subjective:    Patient ID: Monica [name], female    DOB: [birth year], 72 y.o.   MRN: [id number]  HPI  ROV 03/18/18 --72 year old woman, never smoker with allergic rhinitis, GERD, urticaria, atrial fibrillation, bronchiectasis with associated chronic recurrent cough.  She has no evidence of obstructive disease on methacholine challenge 09/2016.  She treated empirically for allergic rhinitis with loratadine, fluticasone, saline nasal spray.  Also for GERD with omeprazole, ranitadine.  She been seen in our office several times in the end of September and into October for persistent cough. She was given doxycycline, then prednisone. She uses flutter, doesn't always help her clear mucous at least when her disease is quiet. Has hypertonic saline available, doesn't use it. She is fatigued.                                                                                                                                                                                                              Review of Systems As per history of present illness     Objective:   Physical Exam Vitals:   03/18/18 1436  BP: 110/68  Pulse: 99  SpO2: 96%  Weight: 100 lb (45.4 kg)  Height: 5' 1.75" (1.568 m)   'Gen: Pleasant, thin woman, well-appearing, in no distress,  normal affect  ENT: No lesions,  mouth clear,  oropharynx clear, no postnasal drip  Neck: No JVD, no stridor  Lungs: No use of accessory muscles, clear without rales or rhonchi, no wheeze   Cardiovascular: RRR, heart sounds normal, no murmur or gallops, no peripheral edema  Musculoskeletal: No deformities, no cyanosis or clubbing  Neuro: alert, non focal  Skin: Warm, no lesions or rashes  CT chest 05/31/16 --  COMPARISON:  Chest CT 01/10/2016.  FINDINGS: Cardiovascular: Heart size is normal. There is no significant pericardial fluid, thickening or pericardial calcification. Atherosclerotic calcifications in the thoracic aorta (mild).  No calcifications are identified in the coronary arteries.  Mediastinum/Nodes: No pathologically enlarged mediastinal or hilar lymph nodes. Please note that accurate exclusion of hilar adenopathy is limited on noncontrast CT scans. Esophagus is unremarkable in appearance. No axillary lymphadenopathy.  Lungs/Pleura: High-resolution images demonstrate widespread areas of cylindrical and varicose bronchiectasis, most evident throughout the mid to lower lungs bilaterally. The areas of greatest involvement demonstrate extensive thickening of the peribronchovascular interstitium with extensive peribronchovascular micro and macronodularity, associated peribronchovascular ground-glass attenuation and regional areas of architectural distortion, all of which have increased compared to prior study 01/10/2016. Relative sparing of the upper lungs. Inspiratory and  expiratory imaging is unremarkable. No confluent consolidative airspace disease. No pleural effusions.  Upper Abdomen: Unremarkable.  Musculoskeletal: There are no aggressive appearing lytic or blastic lesions noted in the visualized portions of the skeleton.  IMPRESSION: 1. 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 2. Aortic atherosclerosis.       Assessment & Plan:  Bronchiectasis without acute exacerbation ([place]) But with a recent flare that required doxycycline, prednisone.  Sputum negative for AFB.  She is improved and is now back to baseline.  She is going to use her flutter valve at least once a day.  Increase the frequency when she flares.  We can change her 3% saline nebulizers to as needed.  Continue to aggressively control her GERD, allergic rhinitis which contribute to her chronic dry cough.   Keep your flutter valve available and use it once daily. You  can stop doing 3% saline nebulizer treatments on a schedule.  Keep it available so that we can use it as needed for mucus clearance. Chest x-ray at your next visit. Flu shot up-to-date Pneumonia vaccine up-to-date  ALLERGIC RHINITIS, SEASONAL Please continue your loratadine, fluticasone nasal spray, saline nasal spray as you are using them.   GERD Continue your omeprazole as needed. Check with your pharmacy to ensure that your ranitidine was not part of the national recall.  Baltazar Apo, MD, PhD 03/18/2018, 2:59 PM [facility] [phone] or if no answer [phone]

## 2018-03-18 NOTE — Patient Instructions (Addendum)
Please continue your loratadine, fluticasone nasal spray, saline nasal spray as you are using them. Continue your omeprazole as needed. Check with your pharmacy to ensure that your ranitidine was not part of the national recall. Keep your flutter valve available and use it once daily. You can stop doing 3% saline nebulizer treatments on a schedule.  Keep it available so that we can use it as needed for mucus clearance. Chest x-ray at your next visit. Flu shot up-to-date Pneumonia vaccine up-to-date Follow with Dr Lamonte Sakai in 6 months or sooner if you have any problems

## 2018-03-18 NOTE — Assessment & Plan Note (Signed)
Please continue your loratadine, fluticasone nasal spray, saline nasal spray as you are using them.

## 2018-03-27 LAB — FUNGUS CULTURE W SMEAR
MICRO NUMBER:: 91195762
SMEAR: NONE SEEN
SPECIMEN QUALITY: ADEQUATE

## 2018-03-27 LAB — MYCOBACTERIA,CULT W/FLUOROCHROME SMEAR
MICRO NUMBER:: 91195763
SMEAR: NONE SEEN
SPECIMEN QUALITY: ADEQUATE

## 2018-03-27 LAB — RESPIRATORY CULTURE OR RESPIRATORY AND SPUTUM CULTURE
MICRO NUMBER:: 91195764
RESULT: NORMAL
SPECIMEN QUALITY:: ADEQUATE

## 2018-03-30 DIAGNOSIS — H6121 Impacted cerumen, right ear: Secondary | ICD-10-CM | POA: Diagnosis not present

## 2018-03-30 DIAGNOSIS — H6981 Other specified disorders of Eustachian tube, right ear: Secondary | ICD-10-CM | POA: Diagnosis not present

## 2018-03-30 DIAGNOSIS — J309 Allergic rhinitis, unspecified: Secondary | ICD-10-CM | POA: Diagnosis not present

## 2018-03-30 DIAGNOSIS — H903 Sensorineural hearing loss, bilateral: Secondary | ICD-10-CM | POA: Diagnosis not present

## 2018-05-01 ENCOUNTER — Other Ambulatory Visit: Payer: Self-pay | Admitting: Pulmonary Disease

## 2018-05-04 DIAGNOSIS — J309 Allergic rhinitis, unspecified: Secondary | ICD-10-CM | POA: Diagnosis not present

## 2018-05-04 DIAGNOSIS — H6061 Unspecified chronic otitis externa, right ear: Secondary | ICD-10-CM | POA: Diagnosis not present

## 2018-05-04 DIAGNOSIS — H903 Sensorineural hearing loss, bilateral: Secondary | ICD-10-CM | POA: Diagnosis not present

## 2018-05-04 DIAGNOSIS — H6981 Other specified disorders of Eustachian tube, right ear: Secondary | ICD-10-CM | POA: Diagnosis not present

## 2018-05-04 DIAGNOSIS — H6121 Impacted cerumen, right ear: Secondary | ICD-10-CM | POA: Diagnosis not present

## 2018-05-14 DIAGNOSIS — M25562 Pain in left knee: Secondary | ICD-10-CM | POA: Diagnosis not present

## 2018-05-14 DIAGNOSIS — R001 Bradycardia, unspecified: Secondary | ICD-10-CM

## 2018-05-14 NOTE — Telephone Encounter (Signed)
Spoke with pt after receiving mychart message. Pt made her aware of Dr.Croitoru's recommendation. Pt sts that she has had the episodes of feeling like her heartrate slows and returned to normal. Pt sts that she has had 2 episodes 2 nights in row while in bed. The episodes are brief and she has not ben able capture using her phone app. Adv her of Dr.Croitoru recommendation for her to wear a holter monitor for 24-48 hours or  2 week zio. Pt would prefer the 2 weeks monitor. Adv pt that I will place the order and fwd a messaging to scheduling to call to schedule the monitor appt, an update will also be fwd to Dr.Croitoru. She verbalizes understanding and voiced appreciation for the call.

## 2018-05-20 ENCOUNTER — Ambulatory Visit (INDEPENDENT_AMBULATORY_CARE_PROVIDER_SITE_OTHER): Payer: Medicare Other

## 2018-05-20 DIAGNOSIS — R001 Bradycardia, unspecified: Secondary | ICD-10-CM

## 2018-06-06 ENCOUNTER — Other Ambulatory Visit: Payer: Self-pay

## 2018-06-06 ENCOUNTER — Encounter: Payer: Self-pay | Admitting: Emergency Medicine

## 2018-06-06 ENCOUNTER — Telehealth: Payer: Self-pay | Admitting: Emergency Medicine

## 2018-06-06 ENCOUNTER — Inpatient Hospital Stay (HOSPITAL_COMMUNITY)
Admission: EM | Admit: 2018-06-06 | Discharge: 2018-06-09 | DRG: 871 | Disposition: A | Discharge status: Home / Self Care | Payer: Medicare Other | Attending: Family Medicine | Admitting: Family Medicine

## 2018-06-06 ENCOUNTER — Emergency Department (HOSPITAL_COMMUNITY): Payer: Medicare Other

## 2018-06-06 ENCOUNTER — Encounter (HOSPITAL_COMMUNITY): Payer: Self-pay | Admitting: *Deleted

## 2018-06-06 DIAGNOSIS — Z66 Do not resuscitate: Secondary | ICD-10-CM | POA: Diagnosis not present

## 2018-06-06 DIAGNOSIS — K219 Gastro-esophageal reflux disease without esophagitis: Secondary | ICD-10-CM | POA: Diagnosis present

## 2018-06-06 DIAGNOSIS — M810 Age-related osteoporosis without current pathological fracture: Secondary | ICD-10-CM | POA: Diagnosis present

## 2018-06-06 DIAGNOSIS — J470 Bronchiectasis with acute lower respiratory infection: Secondary | ICD-10-CM | POA: Diagnosis present

## 2018-06-06 DIAGNOSIS — A419 Sepsis, unspecified organism: Secondary | ICD-10-CM | POA: Diagnosis not present

## 2018-06-06 DIAGNOSIS — J329 Chronic sinusitis, unspecified: Secondary | ICD-10-CM | POA: Diagnosis not present

## 2018-06-06 DIAGNOSIS — J181 Lobar pneumonia, unspecified organism: Secondary | ICD-10-CM | POA: Diagnosis not present

## 2018-06-06 DIAGNOSIS — I493 Ventricular premature depolarization: Secondary | ICD-10-CM | POA: Diagnosis present

## 2018-06-06 DIAGNOSIS — J189 Pneumonia, unspecified organism: Secondary | ICD-10-CM | POA: Diagnosis not present

## 2018-06-06 DIAGNOSIS — Z888 Allergy status to other drugs, medicaments and biological substances status: Secondary | ICD-10-CM

## 2018-06-06 DIAGNOSIS — R05 Cough: Secondary | ICD-10-CM | POA: Diagnosis not present

## 2018-06-06 DIAGNOSIS — I480 Paroxysmal atrial fibrillation: Secondary | ICD-10-CM | POA: Diagnosis present

## 2018-06-06 DIAGNOSIS — R0789 Other chest pain: Secondary | ICD-10-CM | POA: Diagnosis present

## 2018-06-06 DIAGNOSIS — J479 Bronchiectasis, uncomplicated: Secondary | ICD-10-CM

## 2018-06-06 DIAGNOSIS — K589 Irritable bowel syndrome without diarrhea: Secondary | ICD-10-CM | POA: Diagnosis present

## 2018-06-06 DIAGNOSIS — R002 Palpitations: Secondary | ICD-10-CM | POA: Diagnosis present

## 2018-06-06 DIAGNOSIS — Z7901 Long term (current) use of anticoagulants: Secondary | ICD-10-CM

## 2018-06-06 DIAGNOSIS — Z79899 Other long term (current) drug therapy: Secondary | ICD-10-CM

## 2018-06-06 DIAGNOSIS — R000 Tachycardia, unspecified: Secondary | ICD-10-CM | POA: Diagnosis not present

## 2018-06-06 DIAGNOSIS — Z91048 Other nonmedicinal substance allergy status: Secondary | ICD-10-CM

## 2018-06-06 HISTORY — DX: Paroxysmal atrial fibrillation: I48.0

## 2018-06-06 LAB — CBC WITH DIFFERENTIAL/PLATELET
Abs Immature Granulocytes: 0.06 10*3/uL (ref 0.00–0.07)
Basophils Absolute: 0 10*3/uL (ref 0.0–0.1)
Basophils Relative: 0 %
Eosinophils Absolute: 0.1 10*3/uL (ref 0.0–0.5)
Eosinophils Relative: 1 %
HCT: 38.7 % (ref 36.0–46.0)
Hemoglobin: 12.5 g/dL (ref 12.0–15.0)
Immature Granulocytes: 0 %
Lymphocytes Relative: 6 %
Lymphs Abs: 1 10*3/uL (ref 0.7–4.0)
MCH: 30.2 pg (ref 26.0–34.0)
MCHC: 32.3 g/dL (ref 30.0–36.0)
MCV: 93.5 fL (ref 80.0–100.0)
Monocytes Absolute: 1.4 10*3/uL — ABNORMAL HIGH (ref 0.1–1.0)
Monocytes Relative: 8 %
NEUTROS PCT: 85 %
NRBC: 0 % (ref 0.0–0.2)
Neutro Abs: 14.6 10*3/uL — ABNORMAL HIGH (ref 1.7–7.7)
Platelets: 385 10*3/uL (ref 150–400)
RBC: 4.14 MIL/uL (ref 3.87–5.11)
RDW: 12.2 % (ref 11.5–15.5)
WBC: 17.2 10*3/uL — ABNORMAL HIGH (ref 4.0–10.5)

## 2018-06-06 LAB — COMPREHENSIVE METABOLIC PANEL
ALT: 15 U/L (ref 0–44)
AST: 16 U/L (ref 15–41)
Albumin: 3.6 g/dL (ref 3.5–5.0)
Alkaline Phosphatase: 60 U/L (ref 38–126)
Anion gap: 7 (ref 5–15)
BUN: 25 mg/dL — ABNORMAL HIGH (ref 8–23)
CO2: 24 mmol/L (ref 22–32)
Calcium: 9 mg/dL (ref 8.9–10.3)
Chloride: 106 mmol/L (ref 98–111)
Creatinine, Ser: 0.63 mg/dL (ref 0.44–1.00)
GFR calc non Af Amer: >60 mL/min (ref >60)
Glucose, Bld: 121 mg/dL — ABNORMAL HIGH (ref 70–99)
Potassium: 4 mmol/L (ref 3.5–5.1)
Sodium: 137 mmol/L (ref 135–145)
Total Bilirubin: 0.4 mg/dL (ref 0.3–1.2)
Total Protein: 7 g/dL (ref 6.5–8.1)

## 2018-06-06 LAB — URINALYSIS, ROUTINE W REFLEX MICROSCOPIC
Bilirubin Urine: NEGATIVE
Glucose, UA: NEGATIVE mg/dL
Ketones, ur: NEGATIVE mg/dL
Leukocytes, UA: NEGATIVE
Nitrite: NEGATIVE
PROTEIN: NEGATIVE mg/dL
SPECIFIC GRAVITY, URINE: 1.006 (ref 1.005–1.030)
pH: 7 (ref 5.0–8.0)

## 2018-06-06 LAB — LACTIC ACID, PLASMA: Lactic Acid, Venous: 1.5 mmol/L (ref 0.5–1.9)

## 2018-06-06 MED ORDER — SODIUM CHLORIDE 0.9 % IV BOLUS
500.0000 mL | Freq: Once | INTRAVENOUS | Status: AC
  Administered 2018-06-06: 500 mL via INTRAVENOUS

## 2018-06-06 MED ORDER — SODIUM CHLORIDE 0.9 % IV SOLN
500.0000 mg | INTRAVENOUS | Status: DC
  Administered 2018-06-06 – 2018-06-08 (×3): 500 mg via INTRAVENOUS
  Filled 2018-06-06 (×3): qty 500

## 2018-06-06 MED ORDER — SODIUM CHLORIDE 0.9 % IV SOLN
2.0000 g | INTRAVENOUS | Status: DC
  Administered 2018-06-06 – 2018-06-09 (×3): 2 g via INTRAVENOUS
  Filled 2018-06-06 (×2): qty 2
  Filled 2018-06-06: qty 20

## 2018-06-06 NOTE — ED Provider Notes (Addendum)
[facility] DEPT Provider Note   CSN: [id number] Arrival date & time: 06/06/18  2124     History   Chief Complaint No chief complaint on file.   HPI Monica [name] is a 73 y.o. female with a past medical history of A. fib anticoagulated with Eliquis, GERD, cylindrical bronchiectasis, who presents today for evaluation of chest pressure, significantly worse at about 6 tonight.  She reports that she has had intermittent chest pressure over the past week.  She reports since yesterday she has had coughing, chills, and a low-grade fever.  She denies any abdominal pain or shortness of breath.  Mild nausea that is intermittent however no vomiting or diarrhea.  She last had Tylenol at 815pm.  She reports generally feeling poor since she got her shingles vaccine on 1/23, however said that she was starting to get better.  HPI  Past Medical History:  Diagnosis Date  . Adenomatous colon polyp 10/2003  . Allergic rhinitis   . Allergy    SEASONAL  . Bronchiectasis   . Chronic sinusitis   . Diverticulosis   . GERD (gastroesophageal reflux disease)   . Hearing loss   . Hemorrhoids   . Insomnia   . Menopausal disorder   . Osteoporosis   . Palpitations   . Paroxysmal A-fib ([place])   . TMJ syndrome   . Urticaria     Patient Active Problem List   Diagnosis Date Noted  . CAP (community acquired pneumonia) 06/07/2018  . Thrush, oral 02/10/2018  . Upper respiratory infection, viral 02/10/2018  . Weight loss 11/12/2017  . Acute intractable headache 12/27/2016  . Cough 05/27/2016  . Dizziness 01/28/2016  . Headache 09/29/2015  . PAF (paroxysmal atrial fibrillation) ([place]) 09/19/2015  . Allergic reaction 03/22/2015  . Mastoiditis of right side 03/13/2015  . Chest pain, atypical 04/13/2014  . Healthcare maintenance 02/16/2013  . Elevated CA-125 02/16/2013  . IBS (irritable bowel syndrome) 11/25/2010  . ANXIETY DISORDER, SITUATIONAL, MILD 10/20/2009  . ALLERGIC  RHINITIS, SEASONAL 04/23/2007  . GERD 03/10/2007  . Insomnia disorder 01/26/2007  . HEARING LOSS, LEFT EAR 01/26/2007  . Sinusitis, chronic 01/26/2007  . Bronchiectasis without acute exacerbation ([place]) 01/26/2007  . TMJ SYNDROME 01/26/2007  . MENOPAUSAL DISORDER 01/26/2007  . OSTEOPOROSIS 01/26/2007  . PALPITATIONS 01/26/2007    Past Surgical History:  Procedure Laterality Date  . COLONOSCOPY    . NASAL SINUS SURGERY    . TYMPANOSTOMY    . VIDEO BRONCHOSCOPY Bilateral 07/16/2016   Procedure: VIDEO BRONCHOSCOPY WITH FLUORO;  Surgeon: Collene Gobble, MD;  Location: [facility];  Service: Cardiopulmonary;  Laterality: Bilateral;     OB History   No obstetric history on file.      Home Medications    Prior to Admission medications   Medication Sig Start Date End Date Taking? Authorizing Provider  acetaminophen (TYLENOL) 500 MG tablet Take 500 mg by mouth every 6 (six) hours as needed for moderate pain or headache.    [provider]  CALCIUM-VITAMIN D PO Take 1 tablet by mouth daily.    [provider]  Cholecalciferol (VITAMIN D) 1000 UNITS capsule Take 1,000 Units by mouth daily.      [provider]  dextromethorphan (DELSYM) 30 MG/5ML liquid Take 15-30 mLs 2 (two) times daily as needed by mouth for cough.    [provider]  ELIQUIS 5 MG TABS tablet TAKE 1 TABLET TWICE A DAY 03/04/18   Croitoru, Dani Gobble, MD  Estradiol  10 MCG TABS Place 10 mcg 2 (two) times a week vaginally. Pt does not have set days    [provider]  fluticasone (FLONASE) 50 MCG/ACT nasal spray Place 2 sprays into both nostrils at bedtime as needed for allergies or rhinitis.    [provider]  guaiFENesin (MUCINEX) 600 MG 12 hr tablet Take 1,200 mg by mouth 2 (two) times daily.    [provider]  Lactobacillus Rhamnosus, GG, (CULTURELLE PO) Take 1 tablet by mouth daily.    [provider]  loratadine (CLARITIN) 10 MG tablet Take 10 mg  daily as needed by mouth for allergies.     [provider]  MAGNESIUM PO Take 250 mg by mouth daily.     [provider]  metoprolol tartrate (LOPRESSOR) 25 MG tablet Take 0.5 tablets (12.5 mg total) by mouth daily as needed. 12/22/17   Croitoru, Mihai, MD  Multiple Vitamins-Minerals (CENTRUM SILVER PO) Take 1 tablet by mouth daily.     [provider]  omeprazole (PRILOSEC) 20 MG capsule Take 20 mg by mouth as needed.     [provider]  RANITIDINE HCL PO Take 100 mg by mouth 2 (two) times daily.     [provider]  Respiratory Therapy Supplies (FLUTTER) DEVI Twice a day and prn as needed, may increase if feeling worse 01/30/18   Lauraine Rinne, NP  sodium chloride HYPERTONIC 3 % nebulizer solution Take by nebulization 2 (two) times daily. 02/10/18   Lauraine Rinne, NP    Family History Family History  Problem Relation Age of Onset  . Rectal cancer Mother        died age 69  . Seizures Mother   . Colon cancer Mother 29  . Cancer Mother   . Heart disease Father        dies age 39  . Hypertension Father   . Heart disease Maternal Grandfather   . AVM Brother   . Cancer Maternal Grandmother   . Cancer Paternal Uncle        multiple uncles with cancer    Social History Social History   Tobacco Use  . Smoking status: Never Smoker  . Smokeless tobacco: Never Used  Substance Use Topics  . Alcohol use: Not Currently    Alcohol/week: 2.0 standard drinks    Types: 2 Glasses of wine per week  . Drug use: No     Allergies   Albuterol; Amoxicillin-pot clavulanate; Avelox [moxifloxacin hcl in nacl]; Bactrim [sulfamethoxazole-trimethoprim]; Benzonatate; and Ciprofloxacin   Review of Systems Review of Systems  Constitutional: Positive for chills, fatigue and fever.  HENT: Negative for congestion.   Respiratory: Positive for cough, chest tightness and shortness of breath.   Cardiovascular: Positive for chest pain. Negative for palpitations  and leg swelling.  Gastrointestinal: Positive for nausea. Negative for abdominal pain, diarrhea and vomiting.  Genitourinary: Negative for dysuria, frequency and urgency.  Musculoskeletal: Negative for back pain and neck pain.  Skin: Negative for color change and rash.  Psychiatric/Behavioral: Negative for confusion.  All other systems reviewed and are negative.    Physical Exam Updated Vital Signs BP (!) 126/55   Pulse (!) 106   Temp 100.3 F (37.9 C) (Rectal)   Resp 15   Ht 5' 1.75" (1.568 m)   Wt 44 kg   SpO2 100%   BMI 17.89 kg/m   Physical Exam Vitals signs and nursing note reviewed.  Constitutional:      General: She  is not in acute distress.    Appearance: She is well-developed.  HENT:     Head: Normocephalic and atraumatic.     Mouth/Throat:     Mouth: Mucous membranes are moist.  Eyes:     Conjunctiva/sclera: Conjunctivae normal.  Neck:     Musculoskeletal: Normal range of motion and neck supple. No neck rigidity.  Cardiovascular:     Rate and Rhythm: Regular rhythm. Tachycardia present.     Pulses: Normal pulses.     Heart sounds: Normal heart sounds. No murmur.  Pulmonary:     Effort: Pulmonary effort is normal. No respiratory distress.     Breath sounds: Rhonchi (LL lung field) present.  Abdominal:     General: There is no distension.     Palpations: Abdomen is soft.     Tenderness: There is no abdominal tenderness.  Musculoskeletal: Normal range of motion.     Right lower leg: No edema.     Left lower leg: No edema.  Lymphadenopathy:     Cervical: No cervical adenopathy.  Skin:    General: Skin is warm and dry.  Neurological:     General: No focal deficit present.     Mental Status: She is alert.     Cranial Nerves: No cranial nerve deficit.  Psychiatric:        Mood and Affect: Mood normal.        Behavior: Behavior normal.      ED Treatments / Results  Labs (all labs ordered are listed, but only abnormal results are displayed) Labs  Reviewed  COMPREHENSIVE METABOLIC PANEL - Abnormal; Notable for the following components:      Result Value   Glucose, Bld 121 (*)    BUN 25 (*)    All other components within normal limits  CBC WITH DIFFERENTIAL/PLATELET - Abnormal; Notable for the following components:   WBC 17.2 (*)    Neutro Abs 14.6 (*)    Monocytes Absolute 1.4 (*)    All other components within normal limits  URINALYSIS, ROUTINE W REFLEX MICROSCOPIC - Abnormal; Notable for the following components:   Color, Urine STRAW (*)    Hgb urine dipstick SMALL (*)    Bacteria, UA RARE (*)    All other components within normal limits  RESPIRATORY PANEL BY PCR  CULTURE, BLOOD (ROUTINE X 2)  CULTURE, BLOOD (ROUTINE X 2)  LACTIC ACID, PLASMA  I-STAT TROPONIN, ED    EKG EKG Interpretation  Date/Time:  Saturday June 06 2018 22:02:04 EST Ventricular Rate:  107 PR Interval:    QRS Duration: 84 QT Interval:  324 QTC Calculation: 433 R Axis:   37 Text Interpretation:  Age not entered, assumed to be  73 years old for purpose of ECG interpretation Sinus tachycardia Biatrial enlargement RSR' in V1 or V2, probably normal variant No significant change since last tracing Confirmed by Lacretia Leigh ([id number]) on 06/06/2018 11:15:59 PM   Radiology Dg Chest 2 View  Result Date: 06/06/2018 CLINICAL DATA:  Cough, chills, and low-grade fever last night. Chest tightness. EXAM: CHEST - 2 VIEW COMPARISON:  08/04/2017 FINDINGS: Heart size and pulmonary vascularity are normal. Chronic lung disease with bronchiectasis and interstitial fibrosis most prominent in the lung bases and apices, as seen previously. There is possibly increased infiltration in the left lingula since previous study which may indicate superimposed pneumonia. No blunting of costophrenic angles. No pneumothorax. Mediastinal contours appear intact. IMPRESSION: Chronic lung disease with bronchiectasis and interstitial fibrosis. Possible superimposed pneumonia in  the left  lingula. Electronically Signed   By: Lucienne Capers M.D.   On: 06/06/2018 22:48    Procedures Procedures (including critical care time)  Medications Ordered in ED Medications  cefTRIAXone (ROCEPHIN) 2 g in sodium chloride 0.9 % 100 mL IVPB (0 g Intravenous Stopped 06/06/18 2354)  azithromycin (ZITHROMAX) 500 mg in sodium chloride 0.9 % 250 mL IVPB (500 mg Intravenous New Bag/Given 06/06/18 2356)  sodium chloride 0.9 % bolus 500 mL (0 mLs Intravenous Stopped 06/06/18 2354)     Initial Impression / Assessment and Plan / ED Course  I have reviewed the triage vital signs and the nursing notes.  Pertinent labs & imaging results that were available during my care of the patient were reviewed by me and considered in my medical decision making (see chart for details).  Clinical Course as of Jun 07 28  Sun Jun 07, 2018  0029 Spoke with Dr. Roel Cluck who agrees to admit patient.   [EH]    Clinical Course User Index [EH] Lorin Glass, PA-C   Patient presents today for evaluation of cough, chills, and low-grade fever last night.  She upon arrival here is essentially febrile with a rectal temp of 100.3 despite taking 500 mg of Tylenol.  She is tachycardic here, with a heart rate of 114 on arrival.  Her white count is elevated at 17.2, with a neutrophil count of 14.6.  She does not have any significant electrolyte derangements.  X-ray shows concern for left lower lobe pneumonia.  With the combination of leukocytosis, tachycardia, and fever in the setting of pneumonia patient meets sepsis criteria.  Her lactic acid is not elevated.  Her lactic acid was not elevated and her blood pressure remained stable, therefore she was not given full 30/kg fluid bolus.  She is started on Rocephin and azithromycin.  Blood cultures were ordered.    I spoke with Dr. Roel Cluck who agreed to admit the patient.  Final Clinical Impressions(s) / ED Diagnoses   Final diagnoses:  Sepsis without acute organ dysfunction,  due to unspecified organism [facility])  Community acquired pneumonia of left lower lobe of lung [facility])    ED Discharge Orders    None       Ollen Gross 06/07/18 0031    Lacretia Leigh, MD 06/07/18 1805   Addendum 07/03/2018 CRITICAL CARE Performed by: Wyn Quaker Total critical care time: 40 minutes Critical care time was exclusive of separately billable procedures and treating other patients. Critical care was necessary to treat or prevent imminent or life-threatening deterioration. Critical care was time spent personally by me on the following activities: development of treatment plan with patient and/or surrogate as well as nursing, discussions with consultants, evaluation of patient's response to treatment, examination of patient, obtaining history from patient or surrogate, ordering and performing treatments and interventions, ordering and review of laboratory studies, ordering and review of radiographic studies, pulse oximetry and re-evaluation of patient's condition.     Ollen Gross 07/03/18 1650    Lacretia Leigh, MD 07/05/18 [id number]

## 2018-06-06 NOTE — Telephone Encounter (Signed)
Spoke with the patient. She is having chest aching for last 2-3 days. Some low grade fever.  Has her usual chronic cough, seems to be most active in the afternoon. She has persistent PND. She also has been experiencing active reflux right now. She has zantac, isn't taking daily right now. Took pepcid for a few days without relief. She has used some tylenol.   She took the zoster shot recently, felt bad when she was on it, ? Related to fever  She wonders is this is MSK versus some GERD sx.    Recommended restart her omeprazole BID to see if she gets benefit. Use the tylenol, avoid NSAIDS given her GERD. Cough suppression.   She will call us next week if sx persist.    Baltazar Apo, MD, PhD 06/06/2018, 9:32 AM [facility] [phone] or if no answer [phone]

## 2018-06-06 NOTE — ED Triage Notes (Signed)
Pt presents with coughing, chills, and a low grade temp last night.  Pt reports taking a tylenol at 20:15.  Pt had her second shingles vaccine 05/28/2018.  Hx of cylindrical bronchiectasis. Pt also has gastric reflux and recently had her prilosec dosage change.  Pt a/o x 4. Pt reports the coughing has lead to chest tightness.

## 2018-06-06 NOTE — ED Provider Notes (Signed)
Medical screening examination/treatment/procedure(s) were conducted as a shared visit with non-physician practitioner(s) and myself.  I personally evaluated the patient during the encounter.  EKG Interpretation  Date/Time:  Saturday June 06 2018 22:02:04 EST Ventricular Rate:  107 PR Interval:    QRS Duration: 84 QT Interval:  324 QTC Calculation: 433 R Axis:   37 Text Interpretation:  Age not entered, assumed to be  73 years old for purpose of ECG interpretation Sinus tachycardia Biatrial enlargement RSR' in V1 or V2, probably normal variant No significant change since last tracing Confirmed by Lacretia Leigh [id number]) on 06/06/2018 11:12:77 PM 73 year old female presents with cough congestion.  Chest x-ray consistent with pneumonia.  She is febrile here.  Started on IV antibiotics and will be admitted to the hospitalist service   Lacretia Leigh, MD 06/06/18 2330

## 2018-06-07 DIAGNOSIS — I480 Paroxysmal atrial fibrillation: Secondary | ICD-10-CM

## 2018-06-07 DIAGNOSIS — I493 Ventricular premature depolarization: Secondary | ICD-10-CM | POA: Diagnosis present

## 2018-06-07 DIAGNOSIS — J189 Pneumonia, unspecified organism: Secondary | ICD-10-CM | POA: Diagnosis present

## 2018-06-07 DIAGNOSIS — Z7901 Long term (current) use of anticoagulants: Secondary | ICD-10-CM | POA: Diagnosis not present

## 2018-06-07 DIAGNOSIS — J479 Bronchiectasis, uncomplicated: Secondary | ICD-10-CM | POA: Diagnosis not present

## 2018-06-07 DIAGNOSIS — J470 Bronchiectasis with acute lower respiratory infection: Secondary | ICD-10-CM | POA: Diagnosis present

## 2018-06-07 DIAGNOSIS — A419 Sepsis, unspecified organism: Secondary | ICD-10-CM | POA: Diagnosis present

## 2018-06-07 DIAGNOSIS — R0789 Other chest pain: Secondary | ICD-10-CM

## 2018-06-07 DIAGNOSIS — K219 Gastro-esophageal reflux disease without esophagitis: Secondary | ICD-10-CM | POA: Diagnosis present

## 2018-06-07 DIAGNOSIS — Z888 Allergy status to other drugs, medicaments and biological substances status: Secondary | ICD-10-CM | POA: Diagnosis not present

## 2018-06-07 DIAGNOSIS — J329 Chronic sinusitis, unspecified: Secondary | ICD-10-CM | POA: Diagnosis present

## 2018-06-07 DIAGNOSIS — J181 Lobar pneumonia, unspecified organism: Secondary | ICD-10-CM

## 2018-06-07 DIAGNOSIS — Z91048 Other nonmedicinal substance allergy status: Secondary | ICD-10-CM | POA: Diagnosis not present

## 2018-06-07 DIAGNOSIS — Z66 Do not resuscitate: Secondary | ICD-10-CM | POA: Diagnosis present

## 2018-06-07 DIAGNOSIS — R002 Palpitations: Secondary | ICD-10-CM | POA: Diagnosis present

## 2018-06-07 DIAGNOSIS — Z79899 Other long term (current) drug therapy: Secondary | ICD-10-CM | POA: Diagnosis not present

## 2018-06-07 DIAGNOSIS — M810 Age-related osteoporosis without current pathological fracture: Secondary | ICD-10-CM | POA: Diagnosis present

## 2018-06-07 DIAGNOSIS — K589 Irritable bowel syndrome without diarrhea: Secondary | ICD-10-CM | POA: Diagnosis present

## 2018-06-07 LAB — RESPIRATORY PANEL BY PCR

## 2018-06-07 LAB — COMPREHENSIVE METABOLIC PANEL
ALT: 13 U/L (ref 0–44)
AST: 13 U/L — AB (ref 15–41)
Albumin: 3.5 g/dL (ref 3.5–5.0)
Alkaline Phosphatase: 53 U/L (ref 38–126)
Anion gap: 7 (ref 5–15)
BUN: 15 mg/dL (ref 8–23)
CO2: 24 mmol/L (ref 22–32)
Calcium: 8.3 mg/dL — ABNORMAL LOW (ref 8.9–10.3)
Chloride: 106 mmol/L (ref 98–111)
Creatinine, Ser: 0.61 mg/dL (ref 0.44–1.00)
GFR calc Af Amer: >60 mL/min (ref >60)
GFR calc non Af Amer: >60 mL/min (ref >60)
GLUCOSE: 109 mg/dL — AB (ref 70–99)
Potassium: 3.6 mmol/L (ref 3.5–5.1)
Sodium: 137 mmol/L (ref 135–145)
TOTAL PROTEIN: 6.2 g/dL — AB (ref 6.5–8.1)
Total Bilirubin: 0.8 mg/dL (ref 0.3–1.2)

## 2018-06-07 LAB — CBC
HCT: 36.8 % (ref 36.0–46.0)
Hemoglobin: 11.8 g/dL — ABNORMAL LOW (ref 12.0–15.0)
MCH: 29.9 pg (ref 26.0–34.0)
MCHC: 32.1 g/dL (ref 30.0–36.0)
MCV: 93.2 fL (ref 80.0–100.0)
Platelets: 363 10*3/uL (ref 150–400)
RBC: 3.95 MIL/uL (ref 3.87–5.11)
RDW: 12.2 % (ref 11.5–15.5)
WBC: 16.6 10*3/uL — ABNORMAL HIGH (ref 4.0–10.5)
nRBC: 0 % (ref 0.0–0.2)

## 2018-06-07 LAB — MAGNESIUM: Magnesium: 2.2 mg/dL (ref 1.7–2.4)

## 2018-06-07 LAB — PROCALCITONIN: Procalcitonin: <0.1 ng/mL

## 2018-06-07 LAB — LACTIC ACID, PLASMA
LACTIC ACID, VENOUS: 0.7 mmol/L (ref 0.5–1.9)
Lactic Acid, Venous: 1.6 mmol/L (ref 0.5–1.9)

## 2018-06-07 LAB — TROPONIN I
Troponin I: <0.03 ng/mL (ref <0.03)
Troponin I: <0.03 ng/mL (ref <0.03)

## 2018-06-07 LAB — PHOSPHORUS: Phosphorus: 2.4 mg/dL — ABNORMAL LOW (ref 2.5–4.6)

## 2018-06-07 LAB — TSH: TSH: 2.58 u[IU]/mL (ref 0.350–4.500)

## 2018-06-07 MED ORDER — APIXABAN 5 MG PO TABS
5.0000 mg | ORAL_TABLET | Freq: Two times a day (BID) | ORAL | Status: DC
  Administered 2018-06-07 – 2018-06-09 (×5): 5 mg via ORAL
  Filled 2018-06-07 (×5): qty 1

## 2018-06-07 MED ORDER — ONDANSETRON HCL 4 MG/2ML IJ SOLN: 4.0000 mg | Freq: Four times a day (QID) | INTRAMUSCULAR | Status: DC | PRN

## 2018-06-07 MED ORDER — ACETAMINOPHEN 325 MG PO TABS
650.0000 mg | ORAL_TABLET | Freq: Four times a day (QID) | ORAL | Status: DC | PRN
  Administered 2018-06-07: 650 mg via ORAL
  Filled 2018-06-07: qty 2

## 2018-06-07 MED ORDER — LEVALBUTEROL HCL 0.63 MG/3ML IN NEBU: 0.6300 mg | INHALATION_SOLUTION | Freq: Four times a day (QID) | RESPIRATORY_TRACT | Status: DC | PRN

## 2018-06-07 MED ORDER — ACETAMINOPHEN 650 MG RE SUPP: 650.0000 mg | Freq: Four times a day (QID) | RECTAL | Status: DC | PRN

## 2018-06-07 MED ORDER — RISAQUAD PO CAPS
1.0000 | ORAL_CAPSULE | Freq: Every day | ORAL | Status: DC
  Administered 2018-06-07 – 2018-06-09 (×3): 1 via ORAL
  Filled 2018-06-07 (×3): qty 1

## 2018-06-07 MED ORDER — ONDANSETRON HCL 4 MG PO TABS: 4.0000 mg | ORAL_TABLET | Freq: Four times a day (QID) | ORAL | Status: DC | PRN

## 2018-06-07 MED ORDER — GUAIFENESIN ER 600 MG PO TB12: 600.0000 mg | ORAL_TABLET | Freq: Two times a day (BID) | ORAL | Status: DC | PRN

## 2018-06-07 MED ORDER — DEXTROMETHORPHAN POLISTIREX ER 30 MG/5ML PO SUER
15.0000 mg | Freq: Two times a day (BID) | ORAL | Status: DC | PRN
  Administered 2018-06-09: 30 mg via ORAL
  Filled 2018-06-07 (×3): qty 5

## 2018-06-07 MED ORDER — GUAIFENESIN ER 600 MG PO TB12
600.0000 mg | ORAL_TABLET | Freq: Two times a day (BID) | ORAL | Status: DC
  Administered 2018-06-07 – 2018-06-09 (×6): 600 mg via ORAL
  Filled 2018-06-07 (×6): qty 1

## 2018-06-07 MED ORDER — SODIUM CHLORIDE 0.9 % IV SOLN
INTRAVENOUS | Status: DC
  Administered 2018-06-07: 02:00:00 via INTRAVENOUS

## 2018-06-07 MED ORDER — LORATADINE 10 MG PO TABS: 10.0000 mg | ORAL_TABLET | Freq: Every day | ORAL | Status: DC | PRN

## 2018-06-07 MED ORDER — PANTOPRAZOLE SODIUM 40 MG PO TBEC
40.0000 mg | DELAYED_RELEASE_TABLET | Freq: Every day | ORAL | Status: DC
  Administered 2018-06-07 – 2018-06-09 (×3): 40 mg via ORAL
  Filled 2018-06-07 (×3): qty 1

## 2018-06-07 NOTE — ED Notes (Signed)
ED TO INPATIENT HANDOFF REPORT  Name/Age/Gender Monica [name] 73 y.o. female  Code Status   Home/SNF/Other Home  Chief Complaint Flu Like Symptoms   Level of Care/Admitting Diagnosis ED Disposition    ED Disposition Condition [facility] Area: [place] [[id number]]  Level of Care: Telemetry [5]  Admit to tele based on following criteria: Other see comments  Comments: tachycardia  Diagnosis: CAP (community acquired pneumonia) [263335]  Admitting Physician: Toy Baker [[id number]]  Attending Physician: Toy Baker [[id number]]  Estimated length of stay: 3 - 4 days  Certification:: I certify this patient will need inpatient services for at least 2 midnights  PT Class (Do Not Modify): Inpatient [101]  PT Acc Code (Do Not Modify): Private [1]       Medical History Past Medical History:  Diagnosis Date  . Adenomatous colon polyp 10/2003  . Allergic rhinitis   . Allergy    SEASONAL  . Bronchiectasis   . Chronic sinusitis   . Diverticulosis   . GERD (gastroesophageal reflux disease)   . Hearing loss   . Hemorrhoids   . Insomnia   . Menopausal disorder   . Osteoporosis   . Palpitations   . Paroxysmal A-fib ([place])   . TMJ syndrome   . Urticaria     Allergies Allergies  Allergen Reactions  . Albuterol Other (See Comments)    Patient states put her into atrial fib last time she took this medication  . Amoxicillin-Pot Clavulanate Diarrhea    Has patient had a PCN reaction causing immediate rash, facial/tongue/throat swelling, SOB or lightheadedness with hypotension:No Has patient had a PCN reaction causing severe rash involving mucus membranes or skin necrosis:No Has patient had a PCN reaction that required hospitalization:No Has patient had a PCN reaction occurring within the last 10 years:Yes If all of the above answers are "NO", then may proceed with Cephalosporin use  . Avelox [Moxifloxacin Hcl In Nacl] Nausea Only  .  Bactrim [Sulfamethoxazole-Trimethoprim] Other (See Comments)    flushing  . Benzonatate Other (See Comments)    felt bad, out of sorts, disoriented.  . Ciprofloxacin Rash    Rash across abdomen    IV Location/Drains/Wounds Patient Lines/Drains/Airways Status   Active Line/Drains/Airways    Name:   Placement date:   Placement time:   Site:   Days:   Peripheral IV 09/17/15 Left Antecubital   09/17/15    2007    Antecubital   994   Peripheral IV 03/14/17 Left Antecubital   03/14/17    0441    Antecubital   450   Peripheral IV 06/06/18 Left Antecubital   06/06/18    2200    Antecubital   1          Labs/Imaging Results for orders placed or performed during the hospital encounter of 06/06/18 (from the past 48 hour(s))  Comprehensive metabolic panel     Status: Abnormal   Collection Time: 06/06/18 10:06 PM  Result Value Ref Range   Sodium 137 135 - 145 mmol/L   Potassium 4.0 3.5 - 5.1 mmol/L   Chloride 106 98 - 111 mmol/L   CO2 24 22 - 32 mmol/L   Glucose, Bld 121 (H) 70 - 99 mg/dL   BUN 25 (H) 8 - 23 mg/dL   Creatinine, Ser 0.63 0.44 - 1.00 mg/dL   Calcium 9.0 8.9 - 10.3 mg/dL   Total Protein 7.0 6.5 - 8.1 g/dL   Albumin 3.6 3.5 -  5.0 g/dL   AST 16 15 - 41 U/L   ALT 15 0 - 44 U/L   Alkaline Phosphatase 60 38 - 126 U/L   Total Bilirubin 0.4 0.3 - 1.2 mg/dL   GFR calc non Af Amer >60 >60 mL/min   GFR calc Af Amer >60 >60 mL/min   Anion gap 7 5 - 15    Comment: Performed at [facility], [place] [address], [place], [place] [address]  CBC with Differential     Status: Abnormal   Collection Time: 06/06/18 10:06 PM  Result Value Ref Range   WBC 17.2 (H) 4.0 - 10.5 K/uL   RBC 4.14 3.87 - 5.11 MIL/uL   Hemoglobin 12.5 12.0 - 15.0 g/dL   HCT 38.7 36.0 - 46.0 %   MCV 93.5 80.0 - 100.0 fL   MCH 30.2 26.0 - 34.0 pg   MCHC 32.3 30.0 - 36.0 g/dL   RDW 12.2 11.5 - 15.5 %   Platelets 385 150 - 400 K/uL   nRBC 0.0 0.0 - 0.2 %   Neutrophils Relative % 85 %   Neutro  Abs 14.6 (H) 1.7 - 7.7 K/uL   Lymphocytes Relative 6 %   Lymphs Abs 1.0 0.7 - 4.0 K/uL   Monocytes Relative 8 %   Monocytes Absolute 1.4 (H) 0.1 - 1.0 K/uL   Eosinophils Relative 1 %   Eosinophils Absolute 0.1 0.0 - 0.5 K/uL   Basophils Relative 0 %   Basophils Absolute 0.0 0.0 - 0.1 K/uL   Immature Granulocytes 0 %   Abs Immature Granulocytes 0.06 0.00 - 0.07 K/uL    Comment: Performed at [facility], [place] [address], [place], [place] [address]  Lactic acid, plasma     Status: None   Collection Time: 06/06/18 10:06 PM  Result Value Ref Range   Lactic Acid, Venous 1.5 0.5 - 1.9 mmol/L    Comment: Performed at [facility], [place] [address] [place], [place] [address]  Urinalysis, Routine w reflex microscopic     Status: Abnormal   Collection Time: 06/06/18 10:56 PM  Result Value Ref Range   Color, Urine STRAW (A) YELLOW   APPearance CLEAR CLEAR   Specific Gravity, Urine 1.006 1.005 - 1.030   pH 7.0 5.0 - 8.0   Glucose, UA NEGATIVE NEGATIVE mg/dL   Hgb urine dipstick SMALL (A) NEGATIVE   Bilirubin Urine NEGATIVE NEGATIVE   Ketones, ur NEGATIVE NEGATIVE mg/dL   Protein, ur NEGATIVE NEGATIVE mg/dL   Nitrite NEGATIVE NEGATIVE   Leukocytes, UA NEGATIVE NEGATIVE   RBC / HPF 0-5 0 - 5 RBC/hpf   WBC, UA 0-5 0 - 5 WBC/hpf   Bacteria, UA RARE (A) NONE SEEN   Squamous Epithelial / LPF 0-5 0 - 5   Mucus PRESENT     Comment: Performed at [facility], [place] [address] [place], [place] [address]   Dg Chest 2 View  Result Date: 06/06/2018 CLINICAL DATA:  Cough, chills, and low-grade fever last night. Chest tightness. EXAM: CHEST - 2 VIEW COMPARISON:  08/04/2017 FINDINGS: Heart size and pulmonary vascularity are normal. Chronic lung disease with bronchiectasis and interstitial fibrosis most prominent in the lung bases and apices, as seen previously. There is possibly increased infiltration in the left lingula since previous study which may  indicate superimposed pneumonia. No blunting of costophrenic angles. No pneumothorax. Mediastinal contours appear intact. IMPRESSION: Chronic lung disease with bronchiectasis and interstitial fibrosis. Possible superimposed pneumonia in  the left lingula. Electronically Signed   By: Lucienne Capers M.D.   On: 06/06/2018 22:48    Pending Labs Unresulted Labs (From admission, onward)    Start     Ordered   06/06/18 2253  Culture, blood (routine x 2)  BLOOD CULTURE X 2,   STAT     06/06/18 2252   06/06/18 2150  Respiratory Panel by PCR  (Respiratory virus panel with precautions)  Once,   R     06/06/18 2150   Signed and Held  Culture, sputum-assessment  Once,   R    Question:  Patient immune status  Answer:  Normal   Signed and Held   Signed and Held  Gram stain  Once,   R    Question:  Patient immune status  Answer:  Normal   Signed and Held   Signed and Held  Strep pneumoniae urinary antigen  Once,   R     Signed and Held   Signed and Held  Lactic acid, plasma  STAT Now then every 3 hours,   STAT     Signed and Held   Signed and Held  Procalcitonin  Add-on,   R     Signed and Held   Signed and Held  Magnesium  Tomorrow morning,   R    Comments:  Call MD if <1.5    Signed and Held   Signed and Held  Phosphorus  Tomorrow morning,   R     Signed and Held   Signed and Held  TSH  Once,   R    Comments:  Cancel if already done within 1 month and notify MD    Signed and Held   Signed and Held  Comprehensive metabolic panel  Once,   R    Comments:  Cal MD for K<3.5 or >5.0    Signed and Held   Signed and Held  CBC  Once,   R    Comments:  Call for hg <8.0    Signed and Held   Signed and Held  Troponin I - Now Then Q6H  Now then every 6 hours,   R     Signed and Held          Vitals/Pain Today's Vitals   06/06/18 2130 06/06/18 2136 06/06/18 2201 06/06/18 2330  BP: (!) 149/65   (!) 126/55  Pulse: (!) 114   (!) 106  Resp: 18   15  Temp: 99 F (37.2 C)  100.3 F (37.9 C)    TempSrc: Oral  Rectal   SpO2: 98%   100%  Weight:  44 kg    Height:  5' 1.75" (1.568 m)    PainSc:  3       Isolation Precautions Droplet precaution  Medications Medications  cefTRIAXone (ROCEPHIN) 2 g in sodium chloride 0.9 % 100 mL IVPB (0 g Intravenous Stopped 06/06/18 2354)  azithromycin (ZITHROMAX) 500 mg in sodium chloride 0.9 % 250 mL IVPB (500 mg Intravenous New Bag/Given 06/06/18 2356)  sodium chloride 0.9 % bolus 500 mL (0 mLs Intravenous Stopped 06/06/18 2354)    Mobility walks

## 2018-06-07 NOTE — H&P (Signed)
Monica [name] XNA:[id number] DOB: May 29, 1945 DOA: 06/06/2018     PCP: Cassandria Anger, MD   Outpatient Specialists:    CARDS: Dr.Croitoru   Pulmonary   Dr. Lamonte Sakai    Patient arrived to ER on 06/06/18 at 2124  Patient coming from: home Lives   alone    Chief Complaint: cough HPI: Monica [name] is a 73 y.o. female   retired Marine scientist with medical history significant of paroxysmal atrial fibrillation on anticoagulation,   bronchiectasis, gastroesophageal reflux disease, irritable bowel syndrome, osteoporosis, chronic sinusitis and mild anxiety disorder.     Presented with  2-3 days of low-grade fever chest ache initially called her pulmonologist advised her to try to take some GERD medication.  But patient continued to do worse with chills and chest tightness developed low-grade fever up to 99 she went to [facility], ER With abdominal pain although had some mild nausea.  No vomiting or diarrhea.  She felt originally that getting the shingles vaccine have made her feel bad to begin with. NO known sick contacts  Reports some palpitations have had Holter monitor recently Reports frequent PVC's and PAC  Regarding pertinent Chronic problems:    a.fib  CHADSVasc 2 (age, gender).  on Eliquis and metoprolol  Guarding history of bronchiectasis patient has chronic cough 07/16/16-AFB negative, fungal negative 07/16/16-BAL-Pseudomonas Aeruginosa, 60,000 colonies  While in ER: Initially febrile with rectal temperature 100.3 Noted to be tachycardic with heart rate of 140s on arrival elevated white blood cell count up to 17.2  Chest x-ray showing left lower lobe pneumonia patient was started on Rocephin and azithromycin.  Meeting sepsis criteria.  Lactic acid not elevated.  No evidence of hypotension.  Blood cultures ordered  The following Work up has been ordered so far:  Orders Placed This Encounter  Procedures  . Respiratory Panel by PCR  . Culture, blood (routine x 2)  . DG  Chest 2 View  . Comprehensive metabolic panel  . CBC with Differential  . Lactic acid, plasma  . Urinalysis, Routine w reflex microscopic  . Check Rectal Temperature  . Cardiac monitoring  . Refer to Sidebar Report for: Sepsis Bundle ED/IP  . Document vital signs within 1-hour of fluid bolus completion and notify provider of bolus completion  . Document Actual / Estimated Weight  . Insert peripheral IV x 2  . Initiate Carrier Fluid Protocol  . Call Code Sepsis ([facility] [phone]) Reason for Consult? tracking  . Consult to hospitalist  . Droplet precaution  . Pulse oximetry, continuous  . I-stat troponin, ED  . ED EKG  . EKG 12-Lead  . Saline lock IV    Following Medications were ordered in ER: Medications  cefTRIAXone (ROCEPHIN) 2 g in sodium chloride 0.9 % 100 mL IVPB (0 g Intravenous Stopped 06/06/18 2354)  azithromycin (ZITHROMAX) 500 mg in sodium chloride 0.9 % 250 mL IVPB (500 mg Intravenous New Bag/Given 06/06/18 2356)  sodium chloride 0.9 % bolus 500 mL (0 mLs Intravenous Stopped 06/06/18 2354)    Significant initial  Findings: Abnormal Labs Reviewed  COMPREHENSIVE METABOLIC PANEL - Abnormal; Notable for the following components:      Result Value   Glucose, Bld 121 (*)    BUN 25 (*)    All other components within normal limits  CBC WITH DIFFERENTIAL/PLATELET - Abnormal; Notable for the following components:   WBC 17.2 (*)    Neutro Abs 14.6 (*)    Monocytes Absolute 1.4 (*)  All other components within normal limits  URINALYSIS, ROUTINE W REFLEX MICROSCOPIC - Abnormal; Notable for the following components:   Color, Urine STRAW (*)    Hgb urine dipstick SMALL (*)    Bacteria, UA RARE (*)    All other components within normal limits    Lactic Acid, Venous    Component Value Date/Time   LATICACIDVEN 1.5 06/06/2018 2206    Na 137 K 4.0  Cr   stable,  Up from baseline see below Lab Results  Component Value Date   CREATININE 0.63 06/06/2018   CREATININE  0.86 11/12/2017   CREATININE 0.70 03/14/2017     BUN 25  WBC 17.2  HG/HCT  Stable,     Component Value Date/Time   HGB 12.5 06/06/2018 2206   HGB 13.0 09/25/2016 1017   HCT 38.7 06/06/2018 2206   HCT 37.7 09/25/2016 1017     Troponin (Point of Care Test) No results for input(s): TROPIPOC in the last 72 hours.   Lactic acid 1.5  BNP (last 3 results) No results for input(s): BNP in the last 8760 hours.  ProBNP (last 3 results) No results for input(s): PROBNP in the last 8760 hours.    UA   no evidence of UTI  ordered   CXR -  Left lingular PNA   ECG:  Personally reviewed by me showing: HR : 107 Rhythm:  Sinus tachycardia   no evidence of ischemic changes QTC 433      ED Triage Vitals  Enc Vitals Group     BP 06/06/18 2130 (!) 149/65     Pulse Rate 06/06/18 2130 (!) 114     Resp 06/06/18 2130 18     Temp 06/06/18 2130 99 F (37.2 C)     Temp Source 06/06/18 2130 Oral     SpO2 06/06/18 2130 98 %     Weight 06/06/18 2136 97 lb (44 kg)     Height 06/06/18 2136 5' 1.75" (1.568 m)     Head Circumference --      Peak Flow --      Pain Score 06/06/18 2136 3     Pain Loc --      Pain Edu? --      Excl. in [place]? --   TMAX(24)@       Latest  Blood pressure (!) 126/55, pulse (!) 106, temperature 100.3 F (37.9 C), temperature source Rectal, resp. rate 15, height 5' 1.75" (1.568 m), weight 44 kg, SpO2 100 %.    Hospitalist was called for admission for CAP   Review of Systems:    Pertinent positives include:  Fevers, chills, fatigue,  shortness of breath at rest. non-productive cough  Constitutional:  No weight loss, night sweatsweight loss  HEENT:  No headaches, Difficulty swallowing,Tooth/dental problems,Sore throat,  No sneezing, itching, ear ache, nasal congestion, post nasal drip,  Cardio-vascular:  No chest pain, Orthopnea, PND, anasarca, dizziness, palpitations.no Bilateral lower extremity swelling  GI:  No heartburn, indigestion, abdominal pain,  nausea, vomiting, diarrhea, change in bowel habits, loss of appetite, melena, blood in stool, hematemesis Resp:   No dyspnea on exertion, No excess mucus, no productive cough, No  No coughing up of blood.No change in color of mucus.No wheezing. Skin:  no rash or lesions. No jaundice GU:  no dysuria, change in color of urine, no urgency or frequency. No straining to urinate.  No flank pain.  Musculoskeletal:  No joint pain or no joint swelling. No decreased range of motion.  No back pain.  Psych:  No change in mood or affect. No depression or anxiety. No memory loss.  Neuro: no localizing neurological complaints, no tingling, no weakness, no double vision, no gait abnormality, no slurred speech, no confusion  All systems reviewed and apart from [place] all are negative  Past Medical History:   Past Medical History:  Diagnosis Date  . Adenomatous colon polyp 10/2003  . Allergic rhinitis   . Allergy    SEASONAL  . Bronchiectasis   . Chronic sinusitis   . Diverticulosis   . GERD (gastroesophageal reflux disease)   . Hearing loss   . Hemorrhoids   . Insomnia   . Menopausal disorder   . Osteoporosis   . Palpitations   . Paroxysmal A-fib ([place])   . TMJ syndrome   . Urticaria       Past Surgical History:  Procedure Laterality Date  . COLONOSCOPY    . NASAL SINUS SURGERY    . TYMPANOSTOMY    . VIDEO BRONCHOSCOPY Bilateral 07/16/2016   Procedure: VIDEO BRONCHOSCOPY WITH FLUORO;  Surgeon: Collene Gobble, MD;  Location: [facility] ENDOSCOPY;  Service: Cardiopulmonary;  Laterality: Bilateral;    Social History:  Ambulatory  independently     reports that she has never smoked. She has never used smokeless tobacco. She reports previous alcohol use of about 2.0 standard drinks of alcohol per week. She reports that she does not use drugs.     Family History:   Family History  Problem Relation Age of Onset  . Rectal cancer Mother        died age 60  . Seizures Mother   . Colon cancer  Mother 62  . Cancer Mother   . Heart disease Father        dies age 99  . Hypertension Father   . Heart disease Maternal Grandfather   . AVM Brother   . Cancer Maternal Grandmother   . Cancer Paternal Uncle        multiple uncles with cancer    Allergies: Allergies  Allergen Reactions  . Albuterol Other (See Comments)    Patient states put her into atrial fib last time she took this medication  . Amoxicillin-Pot Clavulanate Diarrhea    Has patient had a PCN reaction causing immediate rash, facial/tongue/throat swelling, SOB or lightheadedness with hypotension:No Has patient had a PCN reaction causing severe rash involving mucus membranes or skin necrosis:No Has patient had a PCN reaction that required hospitalization:No Has patient had a PCN reaction occurring within the last 10 years:Yes If all of the above answers are "NO", then may proceed with Cephalosporin use  . Avelox [Moxifloxacin Hcl In Nacl] Nausea Only  . Bactrim [Sulfamethoxazole-Trimethoprim] Other (See Comments)    flushing  . Benzonatate Other (See Comments)    felt bad, out of sorts, disoriented.  . Ciprofloxacin Rash    Rash across abdomen     Prior to Admission medications   Medication Sig Start Date End Date Taking? Authorizing Provider  acetaminophen (TYLENOL) 500 MG tablet Take 500 mg by mouth every 6 (six) hours as needed for moderate pain or headache.    [provider]  CALCIUM-VITAMIN D PO Take 1 tablet by mouth daily.    [provider]  Cholecalciferol (VITAMIN D) 1000 UNITS capsule Take 1,000 Units by mouth daily.      [provider]  dextromethorphan (DELSYM) 30 MG/5ML liquid Take 15-30 mLs 2 (two) times daily as needed by mouth for  cough.    [provider]  ELIQUIS 5 MG TABS tablet TAKE 1 TABLET TWICE A DAY 03/04/18   Croitoru, Mihai, MD  Estradiol 10 MCG TABS Place 10 mcg 2 (two) times a week vaginally. Pt does not have set days    [provider]    fluticasone (FLONASE) 50 MCG/ACT nasal spray Place 2 sprays into both nostrils at bedtime as needed for allergies or rhinitis.    [provider]  guaiFENesin (MUCINEX) 600 MG 12 hr tablet Take 1,200 mg by mouth 2 (two) times daily.    [provider]  Lactobacillus Rhamnosus, GG, (CULTURELLE PO) Take 1 tablet by mouth daily.    [provider]  loratadine (CLARITIN) 10 MG tablet Take 10 mg daily as needed by mouth for allergies.     [provider]  MAGNESIUM PO Take 250 mg by mouth daily.     [provider]  metoprolol tartrate (LOPRESSOR) 25 MG tablet Take 0.5 tablets (12.5 mg total) by mouth daily as needed. 12/22/17   Croitoru, Mihai, MD  Multiple Vitamins-Minerals (CENTRUM SILVER PO) Take 1 tablet by mouth daily.     [provider]  omeprazole (PRILOSEC) 20 MG capsule Take 20 mg by mouth as needed.     [provider]  RANITIDINE HCL PO Take 100 mg by mouth 2 (two) times daily.     [provider]  Respiratory Therapy Supplies (FLUTTER) DEVI Twice a day and prn as needed, may increase if feeling worse 01/30/18   Lauraine Rinne, NP  sodium chloride HYPERTONIC 3 % nebulizer solution Take by nebulization 2 (two) times daily. 02/10/18   Lauraine Rinne, NP   Physical Exam: Blood pressure (!) 126/55, pulse (!) 106, temperature 100.3 F (37.9 C), temperature source Rectal, resp. rate 15, height 5' 1.75" (1.568 m), weight 44 kg, SpO2 100 %. 1. General:  in No Acute distress   acutely ill -appearing 2. Psychological: Alert and  Oriented 3. Head/ENT:    Dry Mucous Membranes                          Head Non traumatic, neck supple                           Poor Dentition 4. SKIN:  decreased Skin turgor,  Skin clean Dry and intact no rash 5. Heart: Regular rate and rhythm no  Murmur, no Rub or gallop 6. Lungs: no wheezes or crackles diminished in the left base 7. Abdomen: Soft, non-tender, Non distended  bowel sounds present 8.  Lower extremities: no clubbing, cyanosis, no  edema 9. Neurologically Grossly intact, moving all 4 extremities equally   10. MSK: Normal range of motion   LABS:     Recent Labs  Lab 06/06/18 2206  WBC 17.2*  NEUTROABS 14.6*  HGB 12.5  HCT 38.7  MCV 93.5  PLT 474   Basic Metabolic Panel: Recent Labs  Lab 06/06/18 2206  NA 137  K 4.0  CL 106  CO2 24  GLUCOSE 121*  BUN 25*  CREATININE 0.63  CALCIUM 9.0      Recent Labs  Lab 06/06/18 2206  AST 16  ALT 15  ALKPHOS 60  BILITOT 0.4  PROT 7.0  ALBUMIN 3.6   No results for input(s): LIPASE, AMYLASE in the last 168 hours. No results for input(s): AMMONIA in the last 168 hours.  HbA1C: No results for input(s): HGBA1C in the last 72 hours. CBG: No results for input(s): GLUCAP in the last 168 hours.    Urine analysis:    Component Value Date/Time   COLORURINE STRAW (A) 06/06/2018 2256   APPEARANCEUR CLEAR 06/06/2018 2256   LABSPEC 1.006 06/06/2018 2256   PHURINE 7.0 06/06/2018 2256   GLUCOSEU NEGATIVE 06/06/2018 2256   GLUCOSEU Negative 04/13/2014 1257   HGBUR SMALL (A) 06/06/2018 2256   HGBUR trace-lysed 06/17/2008 0000   BILIRUBINUR NEGATIVE 06/06/2018 2256   KETONESUR NEGATIVE 06/06/2018 2256   PROTEINUR NEGATIVE 06/06/2018 2256   UROBILINOGEN 0.2 mg/dL (A) 04/13/2014 1257   NITRITE NEGATIVE 06/06/2018 2256   LEUKOCYTESUR NEGATIVE 06/06/2018 2256      Cultures:    Component Value Date/Time   SDES BRONCHIAL ALVEOLAR LAVAGE 07/16/2016 0840   SPECREQUEST NONE 07/16/2016 0840   CULT 60,000 COLONIES/mL PSEUDOMONAS AERUGINOSA (A) 07/16/2016 0840   REPTSTATUS 07/18/2016 FINAL 07/16/2016 0840     Radiological Exams on Admission: Dg Chest 2 View  Result Date: 06/06/2018 CLINICAL DATA:  Cough, chills, and low-grade fever last night. Chest tightness. EXAM: CHEST - 2 VIEW COMPARISON:  08/04/2017 FINDINGS: Heart size and pulmonary vascularity are normal. Chronic lung disease with bronchiectasis and  interstitial fibrosis most prominent in the lung bases and apices, as seen previously. There is possibly increased infiltration in the left lingula since previous study which may indicate superimposed pneumonia. No blunting of costophrenic angles. No pneumothorax. Mediastinal contours appear intact. IMPRESSION: Chronic lung disease with bronchiectasis and interstitial fibrosis. Possible superimposed pneumonia in the left lingula. Electronically Signed   By: Lucienne Capers M.D.   On: 06/06/2018 22:48    Chart has been reviewed    Assessment/Plan  73 y.o. female   retired Marine scientist with medical history significant of paroxysmal atrial fibrillation on anticoagulation,    bronchiectasis, gastroesophageal reflux disease, irritable bowel syndrome, osteoporosis, chronic sinusitis and mild anxiety disorder.     Admitted for CAP and sepis  Present on Admission: . CAP (community acquired pneumonia) -  - will admit for treatment of CAP will start on appropriate antibiotic coverage.   Obtain:  sputum cultures,                  Obtain respiratory panel and influenza serologies                  blood cultures                     strep pneumo UA antigen,                      Provide oxygen as needed.   Marland Kitchen GERD stable continue home medications . Chest pain, atypical doubt ACS but given advanced age will cycle cardiac enzymes and monitor on telemetry especially with patient having history of paroxysmal atrial fibrillation and frequent palpitations  . PAF (paroxysmal atrial fibrillation) (HCC) -in sinus rhythm          - CHA2DS2 vas score 2  : continue current anticoagulation with *  Eliquis,            -  Rate controled:  Currently  On Metoprolol only PRN will hold off Given sepsis and treat underling etiology currently in sinus tach due to sepsis     Palpitations monitor on telemetry  Sepsis -  -SIRS criteria met with  elevated white blood cell count,  tachycardia ,   fever.  without evidence of  end organ damage such as   -Most likely source being:  Pulmonary,   - Obtain serial lactic acid and procalcitonin level.  - Initiate IV antibiotics   - await results of blood  culture  - Rehydrate    Other plan as per orders.  DVT prophylaxis:  Eliquis  Code Status:   DNR/DNI   as per patient   I had personally discussed CODE STATUS with patient    Family Communication:   Family not  at  Bedside    Disposition Plan:   To home once workup is complete and patient is stable                     Would benefit from PT/OT eval prior to DC  Ordered                                      Consults called: none    Admission status:     inpatient     Expect 2 midnight stay secondary to severity of patient's current illness including   hemodynamic instability despite optimal treatment (tachycardia)   Severe lab/radiological/exam abnormalities including:  lingular PNA   and extensive comorbidities including: bronchiectasis That are currently affecting medical management.   I expect  patient to be hospitalized for 2 midnights requiring inpatient medical care.  Patient is at high risk for adverse outcome (such as loss of life or disability) if not treated.  Indication for inpatient stay as follows:   Hemodynamic instability despite maximal medical therapy,     Need for IV antibiotics, IV fluids, IV rate controling medications, IV antihypertensives, IV pain medications    Level of care   tele  For 12H       06/07/2018, 1:04 AM    [facility]     after 2 AM please page floor coverage PA If 7AM-7PM, please contact the day team taking care of the patient using [email or web address]

## 2018-06-07 NOTE — ED Notes (Signed)
Report given to [place], RN on 5E.

## 2018-06-07 NOTE — Progress Notes (Signed)
[facility] PROGRESS NOTE  Monica [name] PJK:[id number] DOB: 1945-11-08 DOA: 06/06/2018 PCP: Cassandria Anger, MD   Narrative: 73 year old female Known history of reflux Atrial fibrillation on anticoagulation  Eliquis chads score 2 Bronchiectasis with recurrent cough Reflux on omeprazole Zantac Chronic sinus pain pressure IBS Prior right mastoiditis and chronic otitis seen in the past by ID has had surgeries for this Previous elevated Ca 125?  Secondary to pulmonary disease in a setting of a known cyst  Admit from home 06/06/2018 with low-grade fever called her pulmonologist came to the emergency room Rectal temp 100.3 tachycardic to the 140s chest x-ray left lower lobe pneumonia patient started on Rocephin and azithromycin  A & Plan Community-acquired pneumonia Patient looks well but still feels about 5 of her usual 10 Discontinued IV fluid at her request as she is passing a lot of urine and it is uncomfortable for her encouraged her to force fluids Continue empiric Rocephin and azithromycin today and will probably be able to transition to orals tomorrow Repeat CBC and labs in a.m. Continue Mucinex 600 twice daily, Delsym 15-30 twice daily as needed Follow blood culture Reflux Continue pantoprazole and will hold Ranfac for now Paroxysmal A. fib chads score 2 on Eliquis Patient just came off of the 2-week event monitor and has been feeling some PACs so we will keep her on telemetry overnight-continue metoprolol 12.5 twice daily as needed in addition to Eliquis 1 tab twice daily on the monitors today she has mainly PVCs Troponin trend is normal so no further work-up discontinue frequent checks IBS Stable at this time no meds Chronic bronchiectasis We will follow to her regular physician this happens relatively infrequently but regularly enough BMI 17 Needs encouragement with regards to bone building activity and increasing weight    DVT on apixaban code Status: Full  CODE STATUS communication: Inpatient with no family present disposition Plan: Inpatient pending resolution expect 24 to 48 hours   Rondel Episcopo, MD  [facility] Via [employer] app OR [email or web address] 7PM-7AM contact night coverage as above 06/07/2018, 7:57 AM  LOS: 0 days   Consultants:  None  Procedures:  None  Antimicrobials:  Azithromycin and ceftriaxone  Interval history/Subjective: Awake alert pleasant no distress tolerating p.o. fairly well no nausea no vomiting no shortness of breath No chest pain No fever  Objective:  Vitals:  Vitals:   06/07/18 0140 06/07/18 0452  BP: (!) 122/56 (!) 115/59  Pulse: 98 98  Resp: 18 20  Temp:  99.1 F (37.3 C)  SpO2: 100% 95%    Exam:  Frail pleasant Caucasian female no distress no icterus no pallor Lungs sound clear bilaterally with no rales no rhonchi No adventitious sounds S1-S2 PVCs on the monitor Abdomen soft nontender no rebound Neurologically intact power 5/5 throat is clear no exudate nose is normal air exam deferred no submandibular lymphadenopathy No lower extremity swelling   I have personally reviewed the following:  DATA   Labs:  Glucose 109 LFTs relatively normal  Lactic acid down from 1.6-0.7, procalcitonin less than 0.1  WBC 16 platelet 363    Scheduled Meds: . acidophilus  1 capsule Oral Daily  . apixaban  5 mg Oral BID  . guaiFENesin  600 mg Oral BID  . pantoprazole  40 mg Oral Daily   Continuous Infusions: . sodium chloride 75 mL/hr at 06/07/18 0156  . azithromycin 500 mg (06/06/18 2356)  . cefTRIAXone (ROCEPHIN)  IV Stopped (06/06/18 2354)    Principal Problem:   CAP (  community acquired pneumonia) Active Problems:   Bronchiectasis without acute exacerbation (HCC)   GERD   Palpitations   Chest pain, atypical   PAF (paroxysmal atrial fibrillation) ([place])   Sepsis ([place])   LOS: 0 days

## 2018-06-08 LAB — CBC WITH DIFFERENTIAL/PLATELET
Abs Immature Granulocytes: 0.1 10*3/uL — ABNORMAL HIGH (ref 0.00–0.07)
BASOS ABS: 0.1 10*3/uL (ref 0.0–0.1)
Basophils Relative: 0 %
Eosinophils Absolute: 0.3 10*3/uL (ref 0.0–0.5)
Eosinophils Relative: 2 %
HCT: 39.1 % (ref 36.0–46.0)
Hemoglobin: 12.2 g/dL (ref 12.0–15.0)
Immature Granulocytes: 1 %
Lymphocytes Relative: 13 %
Lymphs Abs: 1.7 10*3/uL (ref 0.7–4.0)
MCH: 30 pg (ref 26.0–34.0)
MCHC: 31.2 g/dL (ref 30.0–36.0)
MCV: 96.3 fL (ref 80.0–100.0)
Monocytes Absolute: 1.6 10*3/uL — ABNORMAL HIGH (ref 0.1–1.0)
Monocytes Relative: 12 %
NEUTROS ABS: 9.8 10*3/uL — AB (ref 1.7–7.7)
NRBC: 0 % (ref 0.0–0.2)
Neutrophils Relative %: 72 %
Platelets: 318 10*3/uL (ref 150–400)
RBC: 4.06 MIL/uL (ref 3.87–5.11)
RDW: 12.2 % (ref 11.5–15.5)
WBC: 13.6 10*3/uL — ABNORMAL HIGH (ref 4.0–10.5)

## 2018-06-08 LAB — RENAL FUNCTION PANEL
Albumin: 3.1 g/dL — ABNORMAL LOW (ref 3.5–5.0)
Anion gap: 7 (ref 5–15)
BUN: 15 mg/dL (ref 8–23)
CALCIUM: 8.6 mg/dL — AB (ref 8.9–10.3)
CO2: 24 mmol/L (ref 22–32)
Chloride: 107 mmol/L (ref 98–111)
Creatinine, Ser: 0.61 mg/dL (ref 0.44–1.00)
GFR calc non Af Amer: >60 mL/min (ref >60)
Glucose, Bld: 105 mg/dL — ABNORMAL HIGH (ref 70–99)
Phosphorus: 3.7 mg/dL (ref 2.5–4.6)
Potassium: 4.2 mmol/L (ref 3.5–5.1)
Sodium: 138 mmol/L (ref 135–145)

## 2018-06-08 NOTE — Progress Notes (Signed)
SATURATION QUALIFICATIONS: (This note is used to comply with regulatory documentation for home oxygen)  Patient Saturations on Room Air at Rest = 99%  Patient Saturations on Room Air while Ambulating = 99%   

## 2018-06-08 NOTE — Progress Notes (Signed)
[facility] PROGRESS NOTE  Monica [name] FTD:[id number] DOB: Sep 13, 1945 DOA: 06/06/2018 PCP: Cassandria Anger, MD   Narrative: 73 year old female Known history of reflux Atrial fibrillation on anticoagulation  Eliquis chads score 2 Bronchiectasis with recurrent cough Reflux on omeprazole Zantac Chronic sinus pain pressure IBS Prior right mastoiditis and chronic otitis seen in the past by ID has had surgeries for this Previous elevated Ca 125?  Secondary to pulmonary disease in a setting of a known cyst  Admit from home 06/06/2018 with low-grade fever called her pulmonologist came to the emergency room Rectal temp 100.3 tachycardic to the 140s chest x-ray left lower lobe pneumonia patient started on Rocephin and azithromycin  A & Plan Community-acquired pneumonia Still not really feeling well only 6 on 10 and very tired Continue empiric Rocephin and azithromycin and consider transition on 2/4 Repeat CBC and labs in a.m. Continue Mucinex 600 twice daily, Delsym 15-30 twice daily as needed Blood culture negative to date Reflux Continue pantoprazole and will hold Ranfac for now Paroxysmal A. fib chads score 2 on Eliquis No events on monitors continue the same Troponin trend is normal so no further work-up discontinue frequent checks Potentially resume metoprolol low-dose in a.m. but is slightly hypotensive here today IBS Stable at this time no meds Chronic bronchiectasis We will follow to her regular physician this happens relatively infrequently but regularly enough BMI 17 Needs encouragement with regards to bone building activity and increasing weight    DVT on apixaban code Status: Full CODE STATUS communication: Inpatient with no family present disposition Plan: Not ready for discharge patient still not feeling well discontinue monitors today allow shower transition to oral antibiotics a.m. possible depending on fever curve   Verlon Au, MD  [facility] Via  [employer] app OR [email or web address] 7PM-7AM contact night coverage as above 06/08/2018, 11:49 AM  LOS: 1 day   Consultants:  None  Procedures:  None  Antimicrobials:  Azithromycin and ceftriaxone  Interval history/Subjective:  Not feeling well still some cough sputum is diminished to some degree she has some malaise and overall feels tired and washed out has not been up out of bed usually is completely independent  Objective:  Vitals:  Vitals:   06/08/18 1117 06/08/18 1121  BP: 105/60 96/60  Pulse: 93 94  Resp:    Temp:    SpO2: 98% 97%    Exam:  Frail pleasant no distress EOMI NCAT No icterus no pallor Chest is clear without added sound Abdomen soft nontenderNo rebound no guarding Neurologically intact psych euthymic   I have personally reviewed the following:  DATA   Labs:  WBC down from 16.6-13.6 fever curve low    Scheduled Meds: . acidophilus  1 capsule Oral Daily  . apixaban  5 mg Oral BID  . guaiFENesin  600 mg Oral BID  . pantoprazole  40 mg Oral Daily   Continuous Infusions: . azithromycin 500 mg (06/07/18 2235)  . cefTRIAXone (ROCEPHIN)  IV 2 g (06/08/18 0028)    Principal Problem:   CAP (community acquired pneumonia) Active Problems:   Bronchiectasis without acute exacerbation (HCC)   GERD   Palpitations   Chest pain, atypical   PAF (paroxysmal atrial fibrillation) ([place])   Sepsis ([place])   LOS: 1 day

## 2018-06-09 ENCOUNTER — Telehealth: Payer: Self-pay | Admitting: *Deleted

## 2018-06-09 MED ORDER — DOXYCYCLINE HYCLATE 100 MG PO TABS: 100.0000 mg | ORAL_TABLET | Freq: Two times a day (BID) | ORAL | 0 refills | Status: DC

## 2018-06-09 MED ORDER — AZITHROMYCIN 250 MG PO TABS: 500.0000 mg | ORAL_TABLET | Freq: Every day | ORAL | Status: DC

## 2018-06-09 MED ORDER — DOXYCYCLINE HYCLATE 100 MG PO TABS
100.0000 mg | ORAL_TABLET | Freq: Two times a day (BID) | ORAL | Status: DC
  Administered 2018-06-09: 100 mg via ORAL
  Filled 2018-06-09: qty 1

## 2018-06-09 NOTE — Telephone Encounter (Signed)
Called pt to make hosp f/u w/PCP. Pt states she would rather see her pulmonologist instead since the matter is she  dealing with her lungs. She states she has couple more days on her antibiotic and she planned on seeing Dr. Malvin Johns hopefully next week.Decline to schedule...Johny Chess

## 2018-06-09 NOTE — Care Management Note (Signed)
Case Management Note  Patient Details  Name: Monica [name] MRN: [id number] Date of Birth: 24-Aug-1945  Subjective/Objective:                  discharged  Action/Plan: Discharged to home with self-care, orders checked for hhc needs. No CM needs present at time of discharge.  Patient is able to arrangement own appointments and home care.  Expected Discharge Date:  06/09/18               Expected Discharge Plan:  Home/Self Care  In-House Referral:     Discharge planning Services  CM Consult  Post Acute Care Choice:    Choice offered to:     DME Arranged:    DME Agency:     HH Arranged:    HH Agency:     Status of Service:  Completed, signed off  If discussed at [employer] of Stay Meetings, dates discussed:    Additional Comments:  Leeroy Cha, RN 06/09/2018, 11:13 AM

## 2018-06-09 NOTE — Discharge Summary (Signed)
Physician Discharge Summary  JOOD RETANA DGU:[id number] DOB: [birth year] DOA: 06/06/2018  PCP: Cassandria Anger, MD  Admit date: 06/06/2018 Discharge date: 06/09/2018  Time spent: 25 minutes  Recommendations for Outpatient Follow-up:  1. complete Doxycycline 2. Rpt CXr in 2-4 weeks 3. Labs ~ 1 week either opcp or pulm office  Discharge Diagnoses:  Principal Problem:   CAP (community acquired pneumonia) Active Problems:   Bronchiectasis without acute exacerbation (HCC)   GERD   Palpitations   Chest pain, atypical   PAF (paroxysmal atrial fibrillation) ([place])   Sepsis ([place])   Discharge Condition: improved  Diet recommendation: hh  Filed Weights   06/06/18 2136  Weight: 44 kg    History of present illness:  73 year old female Known history of reflux Atrial fibrillation on anticoagulation  Eliquis chads score 2 Bronchiectasis with recurrent cough Reflux on omeprazole Zantac Chronic sinus pain pressure IBS Prior right mastoiditis and chronic otitis seen in the past by ID has had surgeries for this Previous elevated Ca 125?  Secondary to pulmonary disease in a setting of a known cyst  Admit from home 06/06/2018 with low-grade fever called her pulmonologist came to the emergency room Rectal temp 100.3 tachycardic to the 140s chest x-ray left lower lobe pneumonia patient started on Rocephin and azithromycin  Hospital Course:  Community-acquired pneumonia Transitioned from azithro and ceftriax to po doxy to compelte duration of therapy Continue Mucinex 600 twice daily, Delsym 15-30 twice daily as needed Blood culture negative  Reflux Continue pantoprazole--resumed all med son d/c Paroxysmal A. fib chads score 2 on Eliquis No events on monitors continue the same Resumed toprol on d/c IBS Stable at this time no meds Chronic bronchiectasis Patient encouraged to follow with PCP/Pulm BMI 17 Needs encouragement with regards to bone building activity and increasing  weight    Discharge Exam: Vitals:   06/08/18 2148 06/09/18 0523  BP: 125/69 (!) 119/57  Pulse: 83 80  Resp: 16 16  Temp: 98.4 F (36.9 C) 98 F (36.7 C)  SpO2: 97% 96%    General: awake alert pleaan tin nad Cardiovascular: s1 s 2no m/r/g Respiratory: cta b no added sound abd soft nt nd no rebound no gaurd   Discharge Instructions   Discharge Instructions    Diet - low sodium heart healthy   Complete by:  As directed    Discharge instructions   Complete by:  As directed    Please complete your doxycycline Follow up with dr. Lamonte Sakai in 1-2 weeks Get lab at primary Md or Dr. Lamonte Sakai in same time frame Might need repeat cxr in 1 mo   Increase activity slowly   Complete by:  As directed      Allergies as of 06/09/2018      Reactions   Albuterol Other (See Comments)   Patient states put her into atrial fib last time she took this medication   Amoxicillin-pot Clavulanate Diarrhea   Has patient had a PCN reaction causing immediate rash, facial/tongue/throat swelling, SOB or lightheadedness with hypotension:No Has patient had a PCN reaction causing severe rash involving mucus membranes or skin necrosis:No Has patient had a PCN reaction that required hospitalization:No Has patient had a PCN reaction occurring within the last 10 years:Yes If all of the above answers are "NO", then may proceed with Cephalosporin use   Avelox [moxifloxacin Hcl In Nacl] Nausea Only   Bactrim [sulfamethoxazole-trimethoprim] Other (See Comments)   flushing   Benzonatate Other (See Comments)   felt bad, out of  sorts, disoriented.   Ciprofloxacin Rash   Rash across abdomen      Medication List    STOP taking these medications   metoprolol tartrate 25 MG tablet Commonly known as:  LOPRESSOR   sodium chloride HYPERTONIC 3 % nebulizer solution     TAKE these medications   acetaminophen 500 MG tablet Commonly known as:  TYLENOL Take 500 mg by mouth every 6 (six) hours as needed for moderate  pain or headache.   CALCIUM-VITAMIN D PO Take 1 tablet by mouth daily.   CENTRUM SILVER PO Take 1 tablet by mouth daily.   CULTURELLE PO Take 1 tablet by mouth daily.   dextromethorphan 30 MG/5ML liquid Commonly known as:  DELSYM Take 15-30 mLs 2 (two) times daily as needed by mouth for cough.   doxycycline 100 MG tablet Commonly known as:  VIBRA-TABS Take 1 tablet (100 mg total) by mouth every 12 (twelve) hours.   ELIQUIS 5 MG Tabs tablet Generic drug:  apixaban TAKE 1 TABLET TWICE A DAY What changed:  how much to take   Estradiol 10 MCG Tabs vaginal tablet Place 10 mcg 2 (two) times a week vaginally. Pt does not have set days   fluticasone 50 MCG/ACT nasal spray Commonly known as:  FLONASE Place 2 sprays into both nostrils at bedtime as needed for allergies or rhinitis.   FLUTTER Devi Twice a day and prn as needed, may increase if feeling worse   guaiFENesin 600 MG 12 hr tablet Commonly known as:  MUCINEX Take 600 mg by mouth 2 (two) times daily as needed for cough or to loosen phlegm.   loratadine 10 MG tablet Commonly known as:  CLARITIN Take 10 mg daily as needed by mouth for allergies.   MAGNESIUM PO Take 250 mg by mouth daily.   omeprazole 20 MG capsule Commonly known as:  PRILOSEC Take 20 mg by mouth 2 (two) times daily.   ranitidine 150 MG tablet Commonly known as:  ZANTAC Take 150 mg by mouth 2 (two) times daily as needed for heartburn.   Vitamin D 1000 units capsule Take 1,000 Units by mouth daily.      Allergies  Allergen Reactions  . Albuterol Other (See Comments)    Patient states put her into atrial fib last time she took this medication  . Amoxicillin-Pot Clavulanate Diarrhea    Has patient had a PCN reaction causing immediate rash, facial/tongue/throat swelling, SOB or lightheadedness with hypotension:No Has patient had a PCN reaction causing severe rash involving mucus membranes or skin necrosis:No Has patient had a PCN reaction that  required hospitalization:No Has patient had a PCN reaction occurring within the last 10 years:Yes If all of the above answers are "NO", then may proceed with Cephalosporin use  . Avelox [Moxifloxacin Hcl In Nacl] Nausea Only  . Bactrim [Sulfamethoxazole-Trimethoprim] Other (See Comments)    flushing  . Benzonatate Other (See Comments)    felt bad, out of sorts, disoriented.  . Ciprofloxacin Rash    Rash across abdomen      The results of significant diagnostics from this hospitalization (including imaging, microbiology, ancillary and laboratory) are listed below for reference.    Significant Diagnostic Studies: Dg Chest 2 View  Result Date: 06/06/2018 CLINICAL DATA:  Cough, chills, and low-grade fever last night. Chest tightness. EXAM: CHEST - 2 VIEW COMPARISON:  08/04/2017 FINDINGS: Heart size and pulmonary vascularity are normal. Chronic lung disease with bronchiectasis and interstitial fibrosis most prominent in the lung bases and apices,  as seen previously. There is possibly increased infiltration in the left lingula since previous study which may indicate superimposed pneumonia. No blunting of costophrenic angles. No pneumothorax. Mediastinal contours appear intact. IMPRESSION: Chronic lung disease with bronchiectasis and interstitial fibrosis. Possible superimposed pneumonia in the left lingula. Electronically Signed   By: Lucienne Capers M.D.   On: 06/06/2018 22:48    Microbiology: Recent Results (from the past 240 hour(s))  Respiratory Panel by PCR     Status: None   Collection Time: 06/06/18 10:06 PM  Result Value Ref Range Status   Adenovirus NOT DETECTED NOT DETECTED Final   Coronavirus 229E NOT DETECTED NOT DETECTED Final   Coronavirus HKU1 NOT DETECTED NOT DETECTED Final   Coronavirus NL63 NOT DETECTED NOT DETECTED Final   Coronavirus OC43 NOT DETECTED NOT DETECTED Final   Metapneumovirus NOT DETECTED NOT DETECTED Final   Rhinovirus / Enterovirus NOT DETECTED NOT  DETECTED Final   Influenza A NOT DETECTED NOT DETECTED Final   Influenza B NOT DETECTED NOT DETECTED Final   Parainfluenza Virus 1 NOT DETECTED NOT DETECTED Final   Parainfluenza Virus 2 NOT DETECTED NOT DETECTED Final   Parainfluenza Virus 3 NOT DETECTED NOT DETECTED Final   Parainfluenza Virus 4 NOT DETECTED NOT DETECTED Final   Respiratory Syncytial Virus NOT DETECTED NOT DETECTED Final   Bordetella pertussis NOT DETECTED NOT DETECTED Final   Chlamydophila pneumoniae NOT DETECTED NOT DETECTED Final   Mycoplasma pneumoniae NOT DETECTED NOT DETECTED Final    Comment: Performed at [facility], [address], [place], [place] [address]  Culture, blood (routine x 2)     Status: None (Preliminary result)   Collection Time: 06/06/18 10:53 PM  Result Value Ref Range Status   Specimen Description   Final    BLOOD LEFT ANTECUBITAL Performed at [facility] [address], [place], [place] [address]    Special Requests   Final    BOTTLES DRAWN AEROBIC AND ANAEROBIC Blood Culture adequate volume Performed at [facility] [address], [place], [place] [address]    Culture   Final    NO GROWTH 2 DAYS Performed at [facility] [address], [place], [place] [address]    Report Status PENDING  Incomplete  Culture, blood (routine x 2)     Status: None (Preliminary result)   Collection Time: 06/08/18  5:44 AM  Result Value Ref Range Status   Specimen Description   Final    BLOOD BLOOD RIGHT FOREARM Performed at [facility] [address] [place], [place] [address]    Special Requests   Final    BOTTLES DRAWN AEROBIC ONLY Blood Culture adequate volume Performed at [facility] [address] [place], [place] [address]    Culture   Final    NO GROWTH < 24 HOURS Performed at [facility] [address] [place], [place] [address]    Report Status PENDING  Incomplete      Labs: Basic Metabolic Panel: Recent Labs  Lab 06/06/18 2206 06/07/18 0533 06/08/18 0544  NA 137 137 138  K 4.0 3.6 4.2  CL 106 106 107  CO2 24 24 24   GLUCOSE 121* 109* 105*  BUN 25* 15 15  CREATININE 0.63 0.61 0.61  CALCIUM 9.0 8.3* 8.6*  MG  --  2.2  --   PHOS  --  2.4* 3.7   Liver Function Tests: Recent Labs  Lab 06/06/18  2206 06/07/18 0533 06/08/18 0544  AST 16 13*  --   ALT 15 13  --   ALKPHOS 60 53  --   BILITOT 0.4 0.8  --   PROT 7.0 6.2*  --   ALBUMIN 3.6 3.5 3.1*   No results for input(s): LIPASE, AMYLASE in the last 168 hours. No results for input(s): AMMONIA in the last 168 hours. CBC: Recent Labs  Lab 06/06/18 2206 06/07/18 0533 06/08/18 0544  WBC 17.2* 16.6* 13.6*  NEUTROABS 14.6*  --  9.8*  HGB 12.5 11.8* 12.2  HCT 38.7 36.8 39.1  MCV 93.5 93.2 96.3  PLT 385 363 318   Cardiac Enzymes: Recent Labs  Lab 06/07/18 0222 06/07/18 0743  TROPONINI <0.03 <0.03   BNP: BNP (last 3 results) No results for input(s): BNP in the last 8760 hours.  ProBNP (last 3 results) No results for input(s): PROBNP in the last 8760 hours.  CBG: No results for input(s): GLUCAP in the last 168 hours.     Signed:  Nita Sells MD   [facility] 06/09/2018, 9:38 AM

## 2018-06-09 NOTE — Progress Notes (Signed)
Patient has discharged to home on 06/09/2018. Discharge instruction including medication and appointment was given to patient. Patient has no question at this time.

## 2018-06-09 NOTE — Progress Notes (Signed)
PHARMACIST - PHYSICIAN COMMUNICATION DR:   Verlon Au CONCERNING: Antibiotic IV to Oral Route Change Policy  RECOMMENDATION: This patient is receiving zithromax by the intravenous route.  Based on criteria approved by the Pharmacy and Therapeutics Committee, the antibiotic(s) is/are being converted to the equivalent oral dose form(s).   DESCRIPTION: These criteria include:  Patient being treated for a respiratory tract infection, urinary tract infection, cellulitis or clostridium difficile associated diarrhea if on metronidazole  The patient is not neutropenic and does not exhibit a GI malabsorption state  The patient is eating (either orally or via tube) and/or has been taking other orally administered medications for a least 24 hours  The patient is improving clinically and has a Tmax < 100.5  If you have questions about this conversion, please contact the Pharmacy Department  []   [phone] )  [facility] []   [phone] )  [facility] []   [phone] )  [facility] []   [phone] )  [facility] [x]   [phone] )  [place], PharmD, [place] Pager [phone] 06/09/2018 7:17 AM

## 2018-06-11 ENCOUNTER — Encounter: Payer: Self-pay | Admitting: Emergency Medicine

## 2018-06-11 ENCOUNTER — Ambulatory Visit (INDEPENDENT_AMBULATORY_CARE_PROVIDER_SITE_OTHER): Payer: Medicare Other | Admitting: Emergency Medicine

## 2018-06-11 DIAGNOSIS — J479 Bronchiectasis, uncomplicated: Secondary | ICD-10-CM

## 2018-06-11 DIAGNOSIS — J471 Bronchiectasis with (acute) exacerbation: Secondary | ICD-10-CM

## 2018-06-11 DIAGNOSIS — R059 Cough, unspecified: Secondary | ICD-10-CM

## 2018-06-11 DIAGNOSIS — R05 Cough: Secondary | ICD-10-CM | POA: Diagnosis not present

## 2018-06-11 NOTE — Assessment & Plan Note (Signed)
Recent exacerbation plus/minus lingular pneumonia.  She is tolerated 5 total days of antibiotics, now has GI side effects.  I think she can stop the doxycycline now.  I will repeat a CT scan of her chest in 1 month to assess the degree of her bronchiectasis.  We may need to repeat her bronchoscopy at some point for culture data.  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

## 2018-06-11 NOTE — Patient Instructions (Addendum)
Agree with stopping your doxycycline now Try to get back on your fluticasone nasal spray and your omeprazole when you are able to do so Please call our office if your GI symptoms worsen We will perform a CT scan of your chest in 1 month to assess your bronchiectasis Follow with Dr Lamonte Sakai in 1 month after the CT

## 2018-06-11 NOTE — Progress Notes (Signed)
Subjective:    Patient ID: Monica [name], female    DOB: 24-Aug-1945, 73 y.o.   MRN: [id number]  HPI  ROV 03/18/18 --73 year old woman, never smoker with allergic rhinitis, GERD, urticaria, atrial fibrillation, bronchiectasis with associated chronic recurrent cough.  She has no evidence of obstructive disease on methacholine challenge 09/2016.  She treated empirically for allergic rhinitis with loratadine, fluticasone, saline nasal spray.  Also for GERD with omeprazole, ranitadine.  She been seen in our office several times in the end of September and into October for persistent cough. She was given doxycycline, then prednisone. She uses flutter, doesn't always help her clear mucous at least when her disease is quiet. Has hypertonic saline available, doesn't use it. She is fatigued.   ROV 06/11/18 --this a follow-up visit for 73 year old woman, never smoker with chronic recurrent cough in the setting of bronchiectasis, allergic rhinitis, GERD.  She does not have obstruction on a methacholine from 09/2016.  She was admitted to the hospital from 2/1 to 2/4 for possible community-acquired pneumonia. Her sx included a lot of dry cough, chest tightness and soreness.  Chest x-ray from 2/1 was reviewed by me, shows possible increased left midlung opacity compare with priors superimposed on chronic lung findings.  She held zantac, Prilosec twice daily but has held it due to GI upset, Claritin, fluticasone nasal spray prn, on hold due to dry nose.  She is completing a prescription for doxycycline - had to stop it yesterday due to GI upset, not having full blown diarrhea.                                                                                                                                                                                                              Review of Systems As per history of present illness     Objective:   Physical Exam Vitals:   06/11/18 1459  BP: 114/68  Pulse: 96    Temp: 98 F (36.7 C)  TempSrc: Oral  SpO2: 99%  Weight: 96 lb 3.2 oz (43.6 kg)  Height: 5' 1.75" (1.568 m)   'Gen: Pleasant, thin woman, well-appearing, in no distress,  normal affect  ENT: No lesions,  mouth clear,  oropharynx clear, no postnasal drip  Neck: No JVD, no stridor  Lungs: No use of accessory muscles, clear without rales or rhonchi, no wheeze   Cardiovascular: RRR, heart sounds normal, no murmur or gallops, no peripheral edema  Musculoskeletal: No deformities, no cyanosis or clubbing  Neuro: alert, non focal  Skin:  Warm, no lesions or rashes  CT chest 05/31/16 --  COMPARISON:  Chest CT 01/10/2016.  FINDINGS: Cardiovascular: Heart size is normal. There is no significant pericardial fluid, thickening or pericardial calcification. Atherosclerotic calcifications in the thoracic aorta (mild). No calcifications are identified in the coronary arteries.  Mediastinum/Nodes: No pathologically enlarged mediastinal or hilar lymph nodes. Please note that accurate exclusion of hilar adenopathy is limited on noncontrast CT scans. Esophagus is unremarkable in appearance. No axillary lymphadenopathy.  Lungs/Pleura: High-resolution images demonstrate widespread areas of cylindrical and varicose bronchiectasis, most evident throughout the mid to lower lungs bilaterally. The areas of greatest involvement demonstrate extensive thickening of the peribronchovascular interstitium with extensive peribronchovascular micro and macronodularity, associated peribronchovascular ground-glass attenuation and regional areas of architectural distortion, all of which have increased compared to prior study 01/10/2016. Relative sparing of the upper lungs. Inspiratory and expiratory imaging is unremarkable. No confluent consolidative airspace disease. No pleural effusions.  Upper Abdomen: Unremarkable.  Musculoskeletal: There are no aggressive appearing lytic or blastic lesions noted  in the visualized portions of the skeleton.  IMPRESSION: 1. 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 2. Aortic atherosclerosis.       Assessment & Plan:  Cough She is currently off GERD therapy, off fluticasone nasal spray due to GI upset and nose dryness/bleeding respectively.  She can get back on these as soon as her side effects are better.  I do think they are important to help modify her contributors to her chronic cough.  Bronchiectasis without acute exacerbation (HCC) Recent exacerbation plus/minus lingular pneumonia.  She is tolerated 5 total days of antibiotics, now has GI side effects.  I think she can stop the doxycycline now.  I will repeat a CT scan of her chest in 1 month to assess the degree of her bronchiectasis.  We may need to repeat her bronchoscopy at some point for culture data.  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  Baltazar Apo, MD, PhD 06/11/2018, 3:28 PM [facility] [phone] or if no answer [phone]

## 2018-06-11 NOTE — Assessment & Plan Note (Signed)
She is currently off GERD therapy, off fluticasone nasal spray due to GI upset and nose dryness/bleeding respectively.  She can get back on these as soon as her side effects are better.  I do think they are important to help modify her contributors to her chronic cough.

## 2018-06-12 LAB — CULTURE, BLOOD (ROUTINE X 2)
Culture: NO GROWTH
SPECIAL REQUESTS: ADEQUATE

## 2018-06-13 LAB — CULTURE, BLOOD (ROUTINE X 2)
Culture: NO GROWTH
SPECIAL REQUESTS: ADEQUATE

## 2018-06-16 DIAGNOSIS — H6981 Other specified disorders of Eustachian tube, right ear: Secondary | ICD-10-CM | POA: Diagnosis not present

## 2018-06-16 DIAGNOSIS — J309 Allergic rhinitis, unspecified: Secondary | ICD-10-CM | POA: Diagnosis not present

## 2018-06-16 DIAGNOSIS — H903 Sensorineural hearing loss, bilateral: Secondary | ICD-10-CM | POA: Diagnosis not present

## 2018-07-06 ENCOUNTER — Other Ambulatory Visit (INDEPENDENT_AMBULATORY_CARE_PROVIDER_SITE_OTHER): Payer: Medicare Other

## 2018-07-06 DIAGNOSIS — R634 Abnormal weight loss: Secondary | ICD-10-CM

## 2018-07-06 DIAGNOSIS — I480 Paroxysmal atrial fibrillation: Secondary | ICD-10-CM

## 2018-07-06 DIAGNOSIS — E785 Hyperlipidemia, unspecified: Secondary | ICD-10-CM

## 2018-07-06 LAB — BASIC METABOLIC PANEL
BUN: 24 mg/dL — AB (ref 6–23)
CO2: 29 mEq/L (ref 19–32)
Calcium: 9 mg/dL (ref 8.4–10.5)
Chloride: 103 mEq/L (ref 96–112)
Creatinine, Ser: 0.69 mg/dL (ref 0.40–1.20)
GFR: 83.33 mL/min (ref >60.00)
Glucose, Bld: 88 mg/dL (ref 70–99)
Potassium: 4.1 mEq/L (ref 3.5–5.1)
Sodium: 139 mEq/L (ref 135–145)

## 2018-07-06 LAB — LIPID PANEL
Cholesterol: 140 mg/dL (ref 0–200)
HDL: 62.8 mg/dL (ref >39.00)
LDL Cholesterol: 69 mg/dL (ref 0–99)
NonHDL: 76.7
Total CHOL/HDL Ratio: 2
Triglycerides: 41 mg/dL (ref 0.0–149.0)
VLDL: 8.2 mg/dL (ref 0.0–40.0)

## 2018-07-06 LAB — TSH: TSH: 6.19 u[IU]/mL — ABNORMAL HIGH (ref 0.35–4.50)

## 2018-07-06 LAB — HEPATIC FUNCTION PANEL
ALT: 14 U/L (ref 0–35)
AST: 13 U/L (ref 0–37)
Albumin: 4.1 g/dL (ref 3.5–5.2)
Alkaline Phosphatase: 70 U/L (ref 39–117)
BILIRUBIN DIRECT: 0.1 mg/dL (ref 0.0–0.3)
Total Bilirubin: 0.5 mg/dL (ref 0.2–1.2)
Total Protein: 6.8 g/dL (ref 6.0–8.3)

## 2018-07-07 ENCOUNTER — Other Ambulatory Visit (INDEPENDENT_AMBULATORY_CARE_PROVIDER_SITE_OTHER): Payer: Medicare Other

## 2018-07-07 DIAGNOSIS — R946 Abnormal results of thyroid function studies: Secondary | ICD-10-CM

## 2018-07-07 DIAGNOSIS — R7989 Other specified abnormal findings of blood chemistry: Secondary | ICD-10-CM | POA: Diagnosis not present

## 2018-07-07 LAB — T4, FREE: Free T4: 0.81 ng/dL (ref 0.60–1.60)

## 2018-07-08 ENCOUNTER — Ambulatory Visit (INDEPENDENT_AMBULATORY_CARE_PROVIDER_SITE_OTHER): Payer: Medicare Other | Admitting: Internal Medicine

## 2018-07-08 ENCOUNTER — Encounter: Payer: Self-pay | Admitting: Internal Medicine

## 2018-07-08 VITALS — BP 112/62 | HR 64 | Temp 98.0°F | Ht 61.75 in | Wt 98.0 lb

## 2018-07-08 DIAGNOSIS — I480 Paroxysmal atrial fibrillation: Secondary | ICD-10-CM | POA: Diagnosis not present

## 2018-07-08 DIAGNOSIS — J181 Lobar pneumonia, unspecified organism: Secondary | ICD-10-CM

## 2018-07-08 DIAGNOSIS — J479 Bronchiectasis, uncomplicated: Secondary | ICD-10-CM

## 2018-07-08 DIAGNOSIS — R634 Abnormal weight loss: Secondary | ICD-10-CM | POA: Diagnosis not present

## 2018-07-08 DIAGNOSIS — J189 Pneumonia, unspecified organism: Secondary | ICD-10-CM

## 2018-07-08 NOTE — Patient Instructions (Signed)
Weighted blanket 

## 2018-07-08 NOTE — Assessment & Plan Note (Signed)
Eliquis 

## 2018-07-08 NOTE — Assessment & Plan Note (Signed)
Better  

## 2018-07-08 NOTE — Assessment & Plan Note (Signed)
F/u w/Dr [name] 

## 2018-07-08 NOTE — Assessment & Plan Note (Signed)
resolved 

## 2018-07-08 NOTE — Progress Notes (Signed)
Subjective:  Patient ID: Monica [name], female    DOB: 1945/11/24  Age: 73 y.o. MRN: [id number]  CC: No chief complaint on file.   HPI Monica [name] presents for PAF, bronchiectases, recent CAP f/u Less stress now. C/o insomnia  Outpatient Medications Prior to Visit  Medication Sig Dispense Refill  . acetaminophen (TYLENOL) 500 MG tablet Take 500 mg by mouth every 6 (six) hours as needed for moderate pain or headache.    Marland Kitchen CALCIUM-VITAMIN D PO Take 1 tablet by mouth daily.    . Cholecalciferol (VITAMIN D) 1000 UNITS capsule Take 1,000 Units by mouth daily.      Marland Kitchen dextromethorphan (DELSYM) 30 MG/5ML liquid Take 15-30 mLs 2 (two) times daily as needed by mouth for cough.    Marland Kitchen ELIQUIS 5 MG TABS tablet TAKE 1 TABLET TWICE A DAY (Patient taking differently: Take 5 mg by mouth 2 (two) times daily. ) 180 tablet 4  . Estradiol 10 MCG TABS Place 10 mcg 2 (two) times a week vaginally. Pt does not have set days    . fluticasone (FLONASE) 50 MCG/ACT nasal spray Place 2 sprays into both nostrils at bedtime as needed for allergies or rhinitis.    Marland Kitchen guaiFENesin (MUCINEX) 600 MG 12 hr tablet Take 600 mg by mouth 2 (two) times daily as needed for cough or to loosen phlegm.     . Lactobacillus Rhamnosus, GG, (CULTURELLE PO) Take 1 tablet by mouth daily.    Marland Kitchen loratadine (CLARITIN) 10 MG tablet Take 10 mg daily as needed by mouth for allergies.     Marland Kitchen MAGNESIUM PO Take 250 mg by mouth daily.     . Multiple Vitamins-Minerals (CENTRUM SILVER PO) Take 1 tablet by mouth daily.     . Omeprazole 20 MG TBDD     . ranitidine (ZANTAC) 150 MG tablet Take 150 mg by mouth 2 (two) times daily as needed for heartburn.    Marland Kitchen Respiratory Therapy Supplies (FLUTTER) DEVI Twice a day and prn as needed, may increase if feeling worse 1 each 0  . doxycycline (VIBRA-TABS) 100 MG tablet Take 1 tablet (100 mg total) by mouth every 12 (twelve) hours. 8 tablet 0  . omeprazole (PRILOSEC) 20 MG capsule Take 20 mg by mouth 2 (two)  times daily.      No facility-administered medications prior to visit.     ROS: Review of Systems  Constitutional: Negative for activity change, appetite change, chills, fatigue and unexpected weight change.  HENT: Negative for congestion, mouth sores and sinus pressure.   Eyes: Negative for visual disturbance.  Respiratory: Negative for cough and chest tightness.   Gastrointestinal: Negative for abdominal pain and nausea.  Genitourinary: Negative for difficulty urinating, frequency and vaginal pain.  Musculoskeletal: Negative for back pain and gait problem.  Skin: Negative for pallor and rash.  Neurological: Negative for dizziness, tremors, weakness, numbness and headaches.  Psychiatric/Behavioral: Negative for confusion, sleep disturbance and suicidal ideas.    Objective:  BP 112/62 (BP Location: Left Arm, Patient Position: Sitting, Cuff Size: Normal)   Pulse 64   Temp 98 F (36.7 C) (Oral)   Ht 5' 1.75" (1.568 m)   Wt 98 lb (44.5 kg)   SpO2 98%   BMI 18.07 kg/m   BP Readings from Last 3 Encounters:  07/08/18 112/62  06/11/18 114/68  06/09/18 (!) 119/57    Wt Readings from Last 3 Encounters:  07/08/18 98 lb (44.5 kg)  06/11/18 96 lb 3.2 oz (43.6  kg)  06/06/18 97 lb (44 kg)    Physical Exam Constitutional:      General: She is not in acute distress.    Appearance: She is well-developed.  HENT:     Head: Normocephalic.     Right Ear: External ear normal.     Left Ear: External ear normal.     Nose: Nose normal.  Eyes:     General:        Right eye: No discharge.        Left eye: No discharge.     Conjunctiva/sclera: Conjunctivae normal.     Pupils: Pupils are equal, round, and reactive to light.  Neck:     Musculoskeletal: Normal range of motion and neck supple.     Thyroid: No thyromegaly.     Vascular: No JVD.     Trachea: No tracheal deviation.  Cardiovascular:     Rate and Rhythm: Normal rate and regular rhythm.     Heart sounds: Normal heart  sounds.  Pulmonary:     Effort: No respiratory distress.     Breath sounds: No stridor. No wheezing.  Abdominal:     General: Bowel sounds are normal. There is no distension.     Palpations: Abdomen is soft. There is no mass.     Tenderness: There is no abdominal tenderness. There is no guarding or rebound.  Musculoskeletal:        General: No tenderness.  Lymphadenopathy:     Cervical: No cervical adenopathy.  Skin:    Findings: No erythema or rash.  Neurological:     Cranial Nerves: No cranial nerve deficit.     Motor: No abnormal muscle tone.     Coordination: Coordination normal.     Deep Tendon Reflexes: Reflexes normal.  Psychiatric:        Behavior: Behavior normal.        Thought Content: Thought content normal.        Judgment: Judgment normal.   thin  Lab Results  Component Value Date   WBC 13.6 (H) 06/08/2018   HGB 12.2 06/08/2018   HCT 39.1 06/08/2018   PLT 318 06/08/2018   GLUCOSE 88 07/06/2018   CHOL 140 07/06/2018   TRIG 41.0 07/06/2018   HDL 62.80 07/06/2018   LDLCALC 69 07/06/2018   ALT 14 07/06/2018   AST 13 07/06/2018   NA 139 07/06/2018   K 4.1 07/06/2018   CL 103 07/06/2018   CREATININE 0.69 07/06/2018   BUN 24 (H) 07/06/2018   CO2 29 07/06/2018   TSH 6.19 (H) 07/06/2018    Dg Chest 2 View  Result Date: 06/06/2018 CLINICAL DATA:  Cough, chills, and low-grade fever last night. Chest tightness. EXAM: CHEST - 2 VIEW COMPARISON:  08/04/2017 FINDINGS: Heart size and pulmonary vascularity are normal. Chronic lung disease with bronchiectasis and interstitial fibrosis most prominent in the lung bases and apices, as seen previously. There is possibly increased infiltration in the left lingula since previous study which may indicate superimposed pneumonia. No blunting of costophrenic angles. No pneumothorax. Mediastinal contours appear intact. IMPRESSION: Chronic lung disease with bronchiectasis and interstitial fibrosis. Possible superimposed pneumonia in  the left lingula. Electronically Signed   By: Lucienne Capers M.D.   On: 06/06/2018 22:48    Assessment & Plan:   There are no diagnoses linked to this encounter.   No orders of the defined types were placed in this encounter.    Follow-up: No follow-ups on file.  Alex Dailah Opperman,  MD

## 2018-07-10 ENCOUNTER — Ambulatory Visit (INDEPENDENT_AMBULATORY_CARE_PROVIDER_SITE_OTHER)
Admission: RE | Admit: 2018-07-10 | Discharge: 2018-07-10 | Disposition: A | Discharge status: Home / Self Care | Payer: Medicare Other | Source: Ambulatory Visit | Attending: Emergency Medicine | Admitting: Emergency Medicine

## 2018-07-10 DIAGNOSIS — J479 Bronchiectasis, uncomplicated: Secondary | ICD-10-CM | POA: Diagnosis not present

## 2018-07-10 DIAGNOSIS — J471 Bronchiectasis with (acute) exacerbation: Secondary | ICD-10-CM

## 2018-07-13 DIAGNOSIS — Z1231 Encounter for screening mammogram for malignant neoplasm of breast: Secondary | ICD-10-CM | POA: Diagnosis not present

## 2018-07-13 LAB — HM MAMMOGRAPHY

## 2018-08-05 ENCOUNTER — Ambulatory Visit (INDEPENDENT_AMBULATORY_CARE_PROVIDER_SITE_OTHER): Payer: Medicare Other | Admitting: Adult Health

## 2018-08-05 ENCOUNTER — Ambulatory Visit: Payer: Medicare Other | Admitting: Emergency Medicine

## 2018-08-05 ENCOUNTER — Encounter: Payer: Self-pay | Admitting: Adult Health

## 2018-08-05 ENCOUNTER — Other Ambulatory Visit: Payer: Self-pay

## 2018-08-05 DIAGNOSIS — J310 Chronic rhinitis: Secondary | ICD-10-CM | POA: Diagnosis not present

## 2018-08-05 DIAGNOSIS — J479 Bronchiectasis, uncomplicated: Secondary | ICD-10-CM

## 2018-08-05 NOTE — Progress Notes (Signed)
Virtual Visit via Telephone Note  I connected with Monica [name] on 08/05/18 at  9:30 AM EDT by telephone and verified that I am speaking with the correct person using two identifiers.   I discussed the limitations, risks, security and privacy concerns of performing an evaluation and management service by telephone and the availability of in person appointments. I also discussed with the patient that there may be a patient responsible charge related to this service. The patient expressed understanding and agreed to proceed.   History of Present Illness: 73 yo female never smoker followed for Bronchiectasis with recurrent exacerbation  , AR, GERD and urticaria  Hx of A Fib on Eliquis  Has multiple abx allergies   Today's tele-visit is for a follow-up of bronchiectasis /pneumonia and review CT chest results. Patient was seen 2 months ago for follow-up of bronchiectasis.  She says overall she is doing about the same.  She says that her cough is at baseline.  She denies any increased congestion.  No fever or discolored mucus.  She is using her flutter valve every day she tries to use it at least once or twice a day.  She is staying active she does tai chi at home. She does live alone now.  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  She is practicing social distancing with the current [place] VID-19 pandemic. We discussed the importance of pulmonary hygiene with mucociliary clearance.  Have encouraged her to increase her flutter valve use to at least 2-3 times a day at at the minimum.  Use of her hypertonic nebs whenever she starts to feel increased mucus or breathing is not doing as well.  Patient was admitted early February for a pneumonia.  She says she is clinically better but feels that she remains weak from this.  CT chest July 13, 2018 showed stable bronchiectasis with slight improvement in the areas of consolidation along the right lower lobe and left lower lobe compared to  January 2018.  Scattered mucoid impaction and biapical scarring and bronchiectasis with volume loss in the right middle lobe, lingula and both lower lobes. She denies any fever or hemoptysis. Appetite is fair.  She says her weight is been stable.   Observations/Objective: BAL 07/2016 -Psuedomonas (pan sensitive)  CT chest July 10, 2018 Bronchiectasis, peribronchovascular nodularity/consolidation and volume loss in the lungs bilaterally, with some areas of minimal improvement. 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  Assessment and Plan: Bronchiectasis appears to be currently stable.  Patient is encouraged on pulmonary hygiene regimen.  Increase her flutter valve use to 2-3 times a day at least.  Use of hypertonic nebs if needed.  Diet and increased activity. Review of CT chest July 10, 2018 showed stable bronchiectasis.  Chronic rhinitis -cont on preventive regimen   Plan  Patient Instructions  Saline nasal rinses Increase Flutter valve 2-3 times a day .  Hypertonic Neb Twice daily  As needed   Mucinex Twice daily  .As needed  Congestion  Push Fluids and rest  Healthy diet .  Continue on Flonase and Claritin. Follow up with Dr. Lamonte Sakai  In 3  months and as needed Please contact office for sooner follow up if symptoms do not improve or worsen or seek emergency care        Follow Up Instructions: Follow up with Dr. Lamonte Sakai  In 3 months and As needed   Please contact office for sooner follow up  if symptoms do not improve or worsen or seek emergency care      I discussed the assessment and treatment plan with the patient. The patient was provided an opportunity to ask questions and all were answered. The patient agreed with the plan and demonstrated an understanding of the instructions.   The patient was advised to call back or seek an in-person evaluation if the symptoms worsen or if the condition fails to improve as anticipated.  I provided 23  minutes of  non-face-to-face time during this encounter. Patient was present at her home, myself present at office for today's visit  Rexene Edison, NP

## 2018-08-05 NOTE — Patient Instructions (Addendum)
Saline nasal rinses Increase Flutter valve 2-3 times a day .  Hypertonic Neb Twice daily  As needed   Mucinex Twice daily  .As needed  Congestion  Push Fluids and rest  Healthy diet .  Continue on Flonase and Claritin. Follow up with Dr. Lamonte Sakai  In 3  months and as needed Please contact office for sooner follow up if symptoms do not improve or worsen or seek emergency care

## 2018-09-19 ENCOUNTER — Other Ambulatory Visit: Payer: Self-pay

## 2018-09-19 ENCOUNTER — Encounter: Payer: Self-pay | Admitting: Family Medicine

## 2018-09-19 ENCOUNTER — Ambulatory Visit (INDEPENDENT_AMBULATORY_CARE_PROVIDER_SITE_OTHER): Payer: Medicare Other | Admitting: Family Medicine

## 2018-09-19 ENCOUNTER — Telehealth: Payer: Self-pay

## 2018-09-19 DIAGNOSIS — N898 Other specified noninflammatory disorders of vagina: Secondary | ICD-10-CM

## 2018-09-19 DIAGNOSIS — R350 Frequency of micturition: Secondary | ICD-10-CM | POA: Diagnosis not present

## 2018-09-19 MED ORDER — NITROFURANTOIN MONOHYD MACRO 100 MG PO CAPS: 100.0000 mg | ORAL_CAPSULE | Freq: Two times a day (BID) | ORAL | 0 refills | Status: AC

## 2018-09-19 MED ORDER — FLUCONAZOLE 150 MG PO TABS: 150.0000 mg | ORAL_TABLET | Freq: Once | ORAL | 0 refills | Status: AC

## 2018-09-19 NOTE — Progress Notes (Signed)
Virtual Visit via Video Note  I connected with Monica [name] on 09/19/18 at  1:10 PM EDT by a video enabled telemedicine application and verified that I am speaking with the correct person using two identifiers.  Location patient: home Location provider:work or home office Persons participating in the virtual visit: patient, provider  I discussed the limitations of evaluation and management by telemedicine and the availability of in person appointments. The patient expressed understanding and agreed to proceed.   HPI: Pt with itching and irritation of perineum yesterday.  She later developed suprapubic discomfort, nausea, urinary frequency, pressure in lower abdomen.  Urine had some particles in it, now cloudy.  Denies back pain, flank pain, fever, chills, dysuria, hematuria, vaginal d/c.  Having regular BMs.  Pt trying to drink more water and eating yogurt.   ROS: See pertinent positives and negatives per HPI.  Past Medical History:  Diagnosis Date  . Adenomatous colon polyp 10/2003  . Allergic rhinitis   . Allergy    SEASONAL  . Bronchiectasis   . Chronic sinusitis   . Diverticulosis   . GERD (gastroesophageal reflux disease)   . Hearing loss   . Hemorrhoids   . Insomnia   . Menopausal disorder   . Osteoporosis   . Palpitations   . Paroxysmal A-fib ([place])   . TMJ syndrome   . Urticaria     Past Surgical History:  Procedure Laterality Date  . COLONOSCOPY    . NASAL SINUS SURGERY    . TYMPANOSTOMY    . VIDEO BRONCHOSCOPY Bilateral 07/16/2016   Procedure: VIDEO BRONCHOSCOPY WITH FLUORO;  Surgeon: Collene Gobble, MD;  Location: [facility];  Service: Cardiopulmonary;  Laterality: Bilateral;    Family History  Problem Relation Age of Onset  . Rectal cancer Mother        died age 71  . Seizures Mother   . Colon cancer Mother 31  . Cancer Mother   . Heart disease Father        dies age 2  . Hypertension Father   . Heart disease Maternal Grandfather   . AVM  Brother   . Cancer Maternal Grandmother   . Cancer Paternal Uncle        multiple uncles with cancer    SOCIAL HX: Pt is a retire Marine scientist.   Current Outpatient Medications:  .  acetaminophen (TYLENOL) 500 MG tablet, Take 500 mg by mouth every 6 (six) hours as needed for moderate pain or headache., Disp: , Rfl:  .  CALCIUM-VITAMIN D PO, Take 1 tablet by mouth daily., Disp: , Rfl:  .  Cholecalciferol (VITAMIN D) 1000 UNITS capsule, Take 1,000 Units by mouth daily.  , Disp: , Rfl:  .  dextromethorphan (DELSYM) 30 MG/5ML liquid, Take 15-30 mLs 2 (two) times daily as needed by mouth for cough., Disp: , Rfl:  .  ELIQUIS 5 MG TABS tablet, TAKE 1 TABLET TWICE A DAY (Patient taking differently: Take 5 mg by mouth 2 (two) times daily. ), Disp: 180 tablet, Rfl: 4 .  Estradiol 10 MCG TABS, Place 10 mcg 2 (two) times a week vaginally. Pt does not have set days, Disp: , Rfl:  .  fluticasone (FLONASE) 50 MCG/ACT nasal spray, Place 2 sprays into both nostrils at bedtime as needed for allergies or rhinitis., Disp: , Rfl:  .  guaiFENesin (MUCINEX) 600 MG 12 hr tablet, Take 600 mg by mouth 2 (two) times daily as needed for cough or to loosen phlegm. ,  Disp: , Rfl:  .  Lactobacillus Rhamnosus, GG, (CULTURELLE PO), Take 1 tablet by mouth daily., Disp: , Rfl:  .  loratadine (CLARITIN) 10 MG tablet, Take 10 mg daily as needed by mouth for allergies. , Disp: , Rfl:  .  MAGNESIUM PO, Take 250 mg by mouth daily. , Disp: , Rfl:  .  Multiple Vitamins-Minerals (CENTRUM SILVER PO), Take 1 tablet by mouth daily. , Disp: , Rfl:  .  Omeprazole 20 MG TBDD, , Disp: , Rfl:  .  ranitidine (ZANTAC) 150 MG tablet, Take 150 mg by mouth 2 (two) times daily as needed for heartburn., Disp: , Rfl:  .  Respiratory Therapy Supplies (FLUTTER) DEVI, Twice a day and prn as needed, may increase if feeling worse, Disp: 1 each, Rfl: 0  EXAM:  VITALS per patient if applicable:  RR between 12-20 bpm.  Does not pulse went up briefly to 100, but  came back down to normal.  GENERAL: alert, oriented, appears well and in no acute distress  HEENT: atraumatic, conjunctiva clear, no obvious abnormalities on inspection of external nose and ears  NECK: normal movements of the head and neck  LUNGS: on inspection no signs of respiratory distress, breathing rate appears normal, no obvious gross SOB, gasping or wheezing  CV: no obvious cyanosis  MS: moves all visible extremities without noticeable abnormality  PSYCH/NEURO: pleasant and cooperative, no obvious depression or anxiety, speech and thought processing grossly intact  ASSESSMENT AND PLAN:  Discussed the following assessment and plan:  Urinary frequency  -possibly 2/2 UTI -discussed the limitations of not being able to obtain UA and Ucx. -med allergies reviewed.  Had Macrobid in the past without issue. -discussed increasing po intake of fluids - Plan: nitrofurantoin, macrocrystal-monohydrate, (MACROBID) 100 MG capsule -given precautions  Vaginal irritation  - Plan: fluconazole (DIFLUCAN) 150 MG tablet  F/u prn for worsening symptoms   I discussed the assessment and treatment plan with the patient. The patient was provided an opportunity to ask questions and all were answered. The patient agreed with the plan and demonstrated an understanding of the instructions.   The patient was advised to call back or seek an in-person evaluation if the symptoms worsen or if the condition fails to improve as anticipated.   Billie Ruddy, MD

## 2018-09-19 NOTE — Telephone Encounter (Signed)
Pt called to report possible UTI symptoms starting yesterday. She reports cloudy urine, abdominal discomfort, pressure in abdomen, nausea, headache. She denies pain/burning with urination. She has not taken OTC medications. She did speak with [facility] nurse triage and was recommended she be seen and evaluated within 24 hours. Saturday clinic is full.   Pt call back [phone]

## 2018-09-21 ENCOUNTER — Encounter: Payer: Self-pay | Admitting: Internal Medicine

## 2018-09-21 ENCOUNTER — Other Ambulatory Visit (INDEPENDENT_AMBULATORY_CARE_PROVIDER_SITE_OTHER): Payer: Medicare Other

## 2018-09-21 ENCOUNTER — Ambulatory Visit (INDEPENDENT_AMBULATORY_CARE_PROVIDER_SITE_OTHER): Payer: Medicare Other | Admitting: Internal Medicine

## 2018-09-21 DIAGNOSIS — R109 Unspecified abdominal pain: Secondary | ICD-10-CM | POA: Insufficient documentation

## 2018-09-21 DIAGNOSIS — T7840XD Allergy, unspecified, subsequent encounter: Secondary | ICD-10-CM

## 2018-09-21 DIAGNOSIS — R300 Dysuria: Secondary | ICD-10-CM | POA: Diagnosis not present

## 2018-09-21 DIAGNOSIS — R1030 Lower abdominal pain, unspecified: Secondary | ICD-10-CM | POA: Diagnosis not present

## 2018-09-21 LAB — BASIC METABOLIC PANEL
BUN: 25 mg/dL — ABNORMAL HIGH (ref 6–23)
CO2: 28 mEq/L (ref 19–32)
Calcium: 9 mg/dL (ref 8.4–10.5)
Chloride: 103 mEq/L (ref 96–112)
Creatinine, Ser: 0.71 mg/dL (ref 0.40–1.20)
GFR: 80.58 mL/min (ref >60.00)
Glucose, Bld: 103 mg/dL — ABNORMAL HIGH (ref 70–99)
Potassium: 4.5 mEq/L (ref 3.5–5.1)
Sodium: 138 mEq/L (ref 135–145)

## 2018-09-21 LAB — URINALYSIS, ROUTINE W REFLEX MICROSCOPIC
Bilirubin Urine: NEGATIVE
Ketones, ur: NEGATIVE
Leukocytes,Ua: NEGATIVE
Nitrite: NEGATIVE
Specific Gravity, Urine: 1.02 (ref 1.000–1.030)
Total Protein, Urine: NEGATIVE
Urine Glucose: NEGATIVE
Urobilinogen, UA: 0.2 (ref 0.0–1.0)
pH: 5.5 (ref 5.0–8.0)

## 2018-09-21 LAB — CBC WITH DIFFERENTIAL/PLATELET
Basophils Absolute: 0.1 10*3/uL (ref 0.0–0.1)
Basophils Relative: 0.6 % (ref 0.0–3.0)
Eosinophils Absolute: 0.1 10*3/uL (ref 0.0–0.7)
Eosinophils Relative: 0.8 % (ref 0.0–5.0)
HCT: 39.8 % (ref 36.0–46.0)
Hemoglobin: 13.7 g/dL (ref 12.0–15.0)
Lymphocytes Relative: 15.5 % (ref 12.0–46.0)
Lymphs Abs: 1.6 10*3/uL (ref 0.7–4.0)
MCHC: 34.4 g/dL (ref 30.0–36.0)
MCV: 91.8 fl (ref 78.0–100.0)
Monocytes Absolute: 0.9 10*3/uL (ref 0.1–1.0)
Monocytes Relative: 9.2 % (ref 3.0–12.0)
Neutro Abs: 7.5 10*3/uL (ref 1.4–7.7)
Neutrophils Relative %: 73.9 % (ref 43.0–77.0)
Platelets: 349 10*3/uL (ref 150.0–400.0)
RBC: 4.34 Mil/uL (ref 3.87–5.11)
RDW: 13.2 % (ref 11.5–15.5)
WBC: 10.2 10*3/uL (ref 4.0–10.5)

## 2018-09-21 LAB — HEPATIC FUNCTION PANEL
ALT: 11 U/L (ref 0–35)
AST: 13 U/L (ref 0–37)
Albumin: 4.1 g/dL (ref 3.5–5.2)
Alkaline Phosphatase: 70 U/L (ref 39–117)
Bilirubin, Direct: 0 mg/dL (ref 0.0–0.3)
Total Bilirubin: 0.4 mg/dL (ref 0.2–1.2)
Total Protein: 6.9 g/dL (ref 6.0–8.3)

## 2018-09-21 MED ORDER — CRANBERRY 200 MG PO CAPS: ORAL_CAPSULE | ORAL | 1 refills | Status: DC

## 2018-09-21 MED ORDER — DOXYCYCLINE HYCLATE 100 MG PO TABS: 100.0000 mg | ORAL_TABLET | Freq: Two times a day (BID) | ORAL | 1 refills | Status: DC

## 2018-09-21 NOTE — Assessment & Plan Note (Addendum)
Labs.  Discontinue Macrobid due to side effects.  Will use doxycycline and cranberry capsules Obtain lab work

## 2018-09-21 NOTE — Progress Notes (Signed)
Virtual Visit via Video Note  I connected with Monica [name] on 09/21/18 at  2:40 PM EDT by a video enabled telemedicine application and verified that I am speaking with the correct person using two identifiers.   I discussed the limitations of evaluation and management by telemedicine and the availability of in person appointments. The patient expressed understanding and agreed to proceed.  History of Present Illness: Monica [name] is complaining of vaginal discomfort starting Friday evening.  It felt like urinary tract infection.  She checked her urine in the container and it was cloudy on Saturday she developed lower abdominal pain and a little bit of nausea.  She called Dr. Marina Gravel and was prescribed Macrobid.  She developed diarrhea after couple doses.  She stopped Macrobid she may be feeling better.  No vomiting.  Patient has multiple allergies and intolerances to different antibiotics  There has been no runny nose, cough (except for chronic), chest pain, shortness of breath, constipation, arthralgias, skin rashes.   Observations/Objective: The patient appears to be in no acute distress, looks well.  Assessment and Plan:  See my Assessment and Plan. Follow Up Instructions:    I discussed the assessment and treatment plan with the patient. The patient was provided an opportunity to ask questions and all were answered. The patient agreed with the plan and demonstrated an understanding of the instructions.   The patient was advised to call back or seek an in-person evaluation if the symptoms worsen or if the condition fails to improve as anticipated.  I provided face-to-face time during this encounter. We were at different locations.   Walker Kehr, MD

## 2018-09-21 NOTE — Telephone Encounter (Signed)
Pt was seen in Saturday clinic.  Matter addressed.

## 2018-09-21 NOTE — Assessment & Plan Note (Signed)
5/20 possibly related to UTI.  Treat UTI.  Obtain urine, CBC, chemistry Call if not better

## 2018-09-21 NOTE — Assessment & Plan Note (Signed)
Multiple antibiotic allergies and intolerances were discussed with the patient.  Our choices are very limited.

## 2018-09-23 ENCOUNTER — Other Ambulatory Visit: Payer: Self-pay | Admitting: Internal Medicine

## 2018-09-23 DIAGNOSIS — R1030 Lower abdominal pain, unspecified: Secondary | ICD-10-CM

## 2018-09-23 NOTE — Progress Notes (Signed)
abd ct

## 2018-09-24 ENCOUNTER — Other Ambulatory Visit: Payer: Self-pay

## 2018-09-24 ENCOUNTER — Ambulatory Visit
Admission: RE | Admit: 2018-09-24 | Discharge: 2018-09-24 | Disposition: A | Discharge status: Home / Self Care | Payer: Medicare Other | Source: Ambulatory Visit | Attending: Internal Medicine | Admitting: Internal Medicine

## 2018-09-24 DIAGNOSIS — R1030 Lower abdominal pain, unspecified: Secondary | ICD-10-CM

## 2018-09-24 DIAGNOSIS — R109 Unspecified abdominal pain: Secondary | ICD-10-CM | POA: Diagnosis not present

## 2018-09-24 MED ORDER — IOPAMIDOL (ISOVUE-300) INJECTION 61%
100.0000 mL | Freq: Once | INTRAVENOUS | Status: AC | PRN
  Administered 2018-09-24: 100 mL via INTRAVENOUS

## 2018-09-24 NOTE — Addendum Note (Signed)
Addended by: Karren Cobble on: 09/24/2018 10:20 AM   Modules accepted: Orders

## 2018-09-30 ENCOUNTER — Telehealth: Payer: Self-pay | Admitting: *Deleted

## 2018-09-30 NOTE — Telephone Encounter (Signed)
A message was left, re: follow up visit. 

## 2018-10-01 NOTE — Progress Notes (Addendum)
Subjective:   Monica [name] is a 73 y.o. female who presents for Medicare Annual (Subsequent) preventive examination. I connected with patient by a telephone and verified that I am speaking with the correct person using two identifiers. Patient stated full name and DOB. Patient gave permission to continue with telephonic visit. Patient's location was at home and Nurse's location was at [place] office.   Review of Systems:  No ROS.  Medicare Wellness Virtual Visit.  Visual/audio telehealth visit, UTA vital signs.   See social history for additional risk factors.    Sleep patterns: gets up 2-3 times nightly to void and sleeps 5-6 hours nightly.    Home Safety/Smoke Alarms: Feels safe in home. Smoke alarms in place.  Living environment; residence and Firearm Safety: 2-story house. Seat Belt Safety/Bike Helmet: Wears seat belt.     Objective:     Vitals: There were no vitals taken for this visit.  There is no height or weight on file to calculate BMI.  Advanced Directives 10/02/2018 06/06/2018 09/24/2017 03/14/2017 07/16/2016 09/17/2015 07/16/2015  Does Patient Have a Medical Advance Directive? Yes No Yes No Yes No No  Type of Paramedic of [place];Living will - [place];Living will - Living will - -  Does patient want to make changes to medical advance directive? - - - - - - -  Copy of [place] in Chart? No - copy requested - No - copy requested - - - -  Would patient like information on creating a medical advance directive? - Yes (ED - Information included in AVS) - No - Patient declined - No - patient declined information No - patient declined information    Tobacco Social History   Tobacco Use  Smoking Status Never Smoker  Smokeless Tobacco Never Used     Counseling given: Not Answered  Past Medical History:  Diagnosis Date  . Adenomatous colon polyp 10/2003  . Allergic rhinitis   . Allergy    SEASONAL  .  Bronchiectasis   . Chronic sinusitis   . Diverticulosis   . GERD (gastroesophageal reflux disease)   . Hearing loss   . Hemorrhoids   . Insomnia   . Menopausal disorder   . Osteoporosis   . Palpitations   . Paroxysmal A-fib ([place])   . TMJ syndrome   . Urticaria    Past Surgical History:  Procedure Laterality Date  . COLONOSCOPY    . NASAL SINUS SURGERY    . TYMPANOSTOMY    . VIDEO BRONCHOSCOPY Bilateral 07/16/2016   Procedure: VIDEO BRONCHOSCOPY WITH FLUORO;  Surgeon: Collene Gobble, MD;  Location: [facility];  Service: Cardiopulmonary;  Laterality: Bilateral;   Family History  Problem Relation Age of Onset  . Rectal cancer Mother        died age 19  . Seizures Mother   . Colon cancer Mother 14  . Cancer Mother   . Heart disease Father        dies age 38  . Hypertension Father   . Heart disease Maternal Grandfather   . AVM Brother   . Cancer Maternal Grandmother   . Cancer Paternal Uncle        multiple uncles with cancer   Social History   Socioeconomic History  . Marital status: Married    Spouse name: [name]  . Number of children: 0  . Years of education: BSN  . Highest education level: Not on file  Occupational History  .  Occupation: retired Animal nutritionist  . Financial resource strain: Not hard at all  . Food insecurity:    Worry: Never true    Inability: Never true  . Transportation needs:    Medical: No    Non-medical: No  Tobacco Use  . Smoking status: Never Smoker  . Smokeless tobacco: Never Used  Substance and Sexual Activity  . Alcohol use: Not Currently    Alcohol/week: 2.0 standard drinks    Types: 2 Glasses of  per week  . Drug use: No  . Sexual activity: Not Currently  Lifestyle  . Physical activity:    Days per week: 3 days    Minutes per session: 60 min  . Stress: To some extent  Relationships  . Social connections:    Talks on phone: More than three times a week    Gets together: More than three times a week    Attends  religious service: More than 4 times per year    Active member of club or organization: Not on file    Attends meetings of clubs or organizations: More than 4 times per year    Relationship status: Married  Other Topics Concern  . Not on file  Social History Narrative   Lives with husband   Caffeine use: 1 cup tea per day    Outpatient Encounter Medications as of 10/02/2018  Medication Sig  . acetaminophen (TYLENOL) 500 MG tablet Take 500 mg by mouth every 6 (six) hours as needed for moderate pain or headache.  . calcium carbonate (TUMS - DOSED IN MG ELEMENTAL CALCIUM) 500 MG chewable tablet Chew 1 tablet by mouth as needed for indigestion or heartburn.  Marland Kitchen CALCIUM-VITAMIN D PO Take 1 tablet by mouth daily.  . Cholecalciferol (VITAMIN D) 1000 UNITS capsule Take 1,000 Units by mouth daily.    . Cranberry 200 MG CAPS As directed  . dextromethorphan (DELSYM) 30 MG/5ML liquid Take 15-30 mLs 2 (two) times daily as needed by mouth for cough.  Marland Kitchen ELIQUIS 5 MG TABS tablet TAKE 1 TABLET TWICE A DAY (Patient taking differently: Take 5 mg by mouth 2 (two) times daily. )  . Estradiol 10 MCG TABS Place 10 mcg 2 (two) times a week vaginally. Pt does not have set days  . fluticasone (FLONASE) 50 MCG/ACT nasal spray Place 2 sprays into both nostrils at bedtime as needed for allergies or rhinitis.  Marland Kitchen guaiFENesin (MUCINEX) 600 MG 12 hr tablet Take 600 mg by mouth 2 (two) times daily as needed for cough or to loosen phlegm.   . Lactobacillus Rhamnosus, GG, (CULTURELLE PO) Take 1 tablet by mouth daily.  Marland Kitchen loratadine (CLARITIN) 10 MG tablet Take 10 mg daily as needed by mouth for allergies.   Marland Kitchen MAGNESIUM PO Take 250 mg by mouth daily.   . Multiple Vitamins-Minerals (CENTRUM SILVER PO) Take 1 tablet by mouth daily.   . Omeprazole 20 MG TBDD as needed.   Marland Kitchen Respiratory Therapy Supplies (FLUTTER) DEVI Twice a day and prn as needed, may increase if feeling worse  . [DISCONTINUED] doxycycline (VIBRA-TABS) 100 MG  tablet Take 1 tablet (100 mg total) by mouth 2 (two) times daily. (Patient not taking: Reported on 10/02/2018)  . [DISCONTINUED] ranitidine (ZANTAC) 150 MG tablet Take 150 mg by mouth 2 (two) times daily as needed for heartburn.   No facility-administered encounter medications on file as of 10/02/2018.     Activities of Daily Living In your present state of health, do you  have any difficulty performing the following activities: 10/02/2018 06/08/2018  Hearing? N N  Vision? N N  Difficulty concentrating or making decisions? N N  Walking or climbing stairs? N N  Dressing or bathing? N N  Doing errands, shopping? N N  Preparing Food and eating ? N -  Using the Toilet? N -  In the past six months, have you accidently leaked urine? N -  Do you have problems with loss of bowel control? N -  Managing your Medications? N -  Managing your Finances? N -  Housekeeping or managing your Housekeeping? N -  Some recent data might be hidden    Patient Care Team: Plotnikov, Evie Lacks, MD as PCP - General (Internal Medicine) Croitoru, Dani Gobble, MD as PCP - Cardiology (Cardiology) Azucena Fallen, MD as Consulting Physician (Obstetrics and Gynecology) Ladene Artist, MD as Consulting Physician (Gastroenterology) Collene Gobble, MD as Consulting Physician (Pulmonary Disease) Jerrell Belfast, MD as Consulting Physician (Otolaryngology) Jari Pigg, MD as Consulting Physician (Dermatology) Jola Schmidt, MD as Consulting Physician (Ophthalmology)    Assessment:   This is a routine wellness examination for Maylie. Physical assessment deferred to PCP.  Exercise Activities and Dietary recommendations Current Exercise Habits: Home exercise routine Diet (meal preparation, eat out, water intake, caffeinated beverages, dairy products, fruits and vegetables): in general, a "healthy" diet  , well balanced. Decreased appetite at times. eats a variety of fruits and vegetables daily. Stays hydrated with water being  primary fluid. Supplements with Ensure.  Goals    . Patient Stated     Stay as healthy as possible        Fall Risk Fall Risk  10/02/2018 09/24/2017 08/13/2017 11/25/2016 04/04/2015  Falls in the past year? 0 Yes No No No  Comment - - - Emmi Telephone Survey: data to providers prior to load -  Number falls in past yr: 0 1 - - -  Injury with Fall? - No - - -   Depression Screen PHQ 2/9 Scores 10/02/2018 09/24/2017 08/13/2017 04/04/2015  PHQ - 2 Score 0 1 0 0  PHQ- 9 Score - 5 - -     Cognitive Function       Ad8 score reviewed for issues:  Issues making decisions: no  Less interest in hobbies / activities: no  Repeats questions, stories (family complaining): no  Trouble using ordinary gadgets (microwave, computer, phone):no  Forgets the month or year: no  Mismanaging finances: no  Remembering appts: no  Daily problems with thinking and/or memory: no Ad8 score is= 0  Immunization History  Administered Date(s) Administered  . Influenza Split 02/25/2011, 02/05/2012  . Influenza Whole 03/02/2008, 03/02/2009, 02/06/2010  . Influenza, High Dose Seasonal PF 01/28/2017  . Influenza,inj,Quad PF,6+ Mos 02/04/2013, 01/24/2014, 02/02/2015, 02/01/2016, 01/22/2018  . Influenza-Unspecified 01/04/2017  . Pneumococcal Conjugate-13 01/04/2013, 02/16/2013  . Pneumococcal Polysaccharide-23 03/11/2003, 01/16/2009, 09/26/2014  . Td 06/06/2001  . Tdap 07/17/2012  . Zoster 05/07/2007  . Zoster Recombinat (Shingrix) 12/09/2017, 05/28/2018   Screening Tests Health Maintenance  Topic Date Due  . MAMMOGRAM  05/28/2018  . INFLUENZA VACCINE  12/05/2018  . COLONOSCOPY  01/15/2019  . TETANUS/TDAP  07/18/2022  . DEXA SCAN  Completed  . Hepatitis C Screening  Completed  . PNA vac Low Risk Adult  Completed      Plan:    Reviewed health maintenance screenings with patient today and relevant education, vaccines, and/or referrals were provided.   Continue to eat heart healthy diet (full  of  fruits, vegetables, whole grains, lean protein, water--limit salt, fat, and sugar intake) and increase physical activity as tolerated.  Continue doing brain stimulating activities (puzzles, reading, adult coloring books, staying active) to keep memory sharp.   I have personally reviewed and noted the following in the patient's chart:   . Medical and social history . Use of alcohol, tobacco or illicit drugs  . Current medications and supplements . Functional ability and status . Nutritional status . Physical activity . Advanced directives . List of other physicians . Screenings to include cognitive, depression, and falls . Referrals and appointments  In addition, I have reviewed and discussed with patient certain preventive protocols, quality metrics, and best practice recommendations. A written personalized care plan for preventive services as well as general preventive health recommendations were provided to patient.     Michiel Cowboy, RN  10/02/2018    Medical screening examination/treatment/procedure(s) were performed by non-physician practitioner and as supervising physician I was immediately available for consultation/collaboration. I agree with above. Binnie Rail, MD

## 2018-10-02 ENCOUNTER — Ambulatory Visit (INDEPENDENT_AMBULATORY_CARE_PROVIDER_SITE_OTHER): Payer: Medicare Other | Admitting: *Deleted

## 2018-10-02 DIAGNOSIS — Z0000 Encounter for general adult medical examination without abnormal findings: Secondary | ICD-10-CM | POA: Diagnosis not present

## 2018-10-16 DIAGNOSIS — H2513 Age-related nuclear cataract, bilateral: Secondary | ICD-10-CM | POA: Diagnosis not present

## 2018-10-16 DIAGNOSIS — H524 Presbyopia: Secondary | ICD-10-CM | POA: Diagnosis not present

## 2018-11-04 ENCOUNTER — Encounter: Payer: Self-pay | Admitting: Internal Medicine

## 2018-11-09 ENCOUNTER — Ambulatory Visit: Payer: Medicare Other | Admitting: Internal Medicine

## 2018-11-20 DIAGNOSIS — H6981 Other specified disorders of Eustachian tube, right ear: Secondary | ICD-10-CM | POA: Diagnosis not present

## 2018-11-20 DIAGNOSIS — R51 Headache: Secondary | ICD-10-CM | POA: Diagnosis not present

## 2018-11-20 DIAGNOSIS — Z9622 Myringotomy tube(s) status: Secondary | ICD-10-CM | POA: Diagnosis not present

## 2018-11-20 DIAGNOSIS — M26629 Arthralgia of temporomandibular joint, unspecified side: Secondary | ICD-10-CM | POA: Diagnosis not present

## 2018-11-20 DIAGNOSIS — Z8709 Personal history of other diseases of the respiratory system: Secondary | ICD-10-CM | POA: Diagnosis not present

## 2018-11-24 ENCOUNTER — Encounter: Payer: Self-pay | Admitting: Emergency Medicine

## 2018-11-24 ENCOUNTER — Other Ambulatory Visit: Payer: Self-pay

## 2018-11-24 ENCOUNTER — Ambulatory Visit (INDEPENDENT_AMBULATORY_CARE_PROVIDER_SITE_OTHER): Payer: Medicare Other | Admitting: Emergency Medicine

## 2018-11-24 DIAGNOSIS — J301 Allergic rhinitis due to pollen: Secondary | ICD-10-CM | POA: Diagnosis not present

## 2018-11-24 DIAGNOSIS — J479 Bronchiectasis, uncomplicated: Secondary | ICD-10-CM

## 2018-11-24 MED ORDER — PREDNISONE 10 MG PO TABS: ORAL_TABLET | ORAL | 0 refills | Status: DC

## 2018-11-24 NOTE — Progress Notes (Signed)
Subjective:    Patient ID: Monica [name], female    DOB: 03/20/1946, 73 y.o.   MRN: [id number]  HPI  ROV 03/18/18 --73 year old woman, never smoker with allergic rhinitis, GERD, urticaria, atrial fibrillation, bronchiectasis with associated chronic recurrent cough.  She has no evidence of obstructive disease on methacholine challenge 09/2016.  She treated empirically for allergic rhinitis with loratadine, fluticasone, saline nasal spray.  Also for GERD with omeprazole, ranitadine.  She been seen in our office several times in the end of September and into October for persistent cough. She was given doxycycline, then prednisone. She uses flutter, doesn't always help her clear mucous at least when her disease is quiet. Has hypertonic saline available, doesn't use it. She is fatigued.   ROV 06/11/18 --this a follow-up visit for 73 year old woman, never smoker with chronic recurrent cough in the setting of bronchiectasis, allergic rhinitis, GERD.  She does not have obstruction on a methacholine from 09/2016.  She was admitted to the hospital from 2/1 to 2/4 for possible community-acquired pneumonia. Her sx included a lot of dry cough, chest tightness and soreness.  Chest x-ray from 2/1 was reviewed by me, shows possible increased left midlung opacity compare with priors superimposed on chronic lung findings.  She held zantac, Prilosec twice daily but has held it due to GI upset, Claritin, fluticasone nasal spray prn, on hold due to dry nose.  She is completing a prescription for doxycycline - had to stop it yesterday due to GI upset, not having full blown diarrhea.   ROV 11/24/2018 --[name] is a 43, a never smoker with a history of bronchiectasis and associated chronic cough.  She has allergic rhinitis and GERD which are both contributors.  No evidence of obstructive lung disease.  Her most recent CT scan of the chest was done 07/13/2018 which I reviewed, shows some biapical scar, bronchiectatic change with  peribronchovascular nodularity and volume loss in the lingula, right middle lobe, bilateral lower lobes with some scattered mucoid impaction.  She has a flutter valve which he uses approximately 2-3 x a day, also has hypertonic saline which she doesn't use.  She is maintained on fluticasone and loratadine, and [place]. She was on zantac and prilosec in the past - isn't on anything right now. She averages 1-2 spells of coughing daily.  Review of Systems As per history of present illness     Objective:   Physical Exam Vitals:   11/24/18 1016 11/24/18 1017  BP:  104/60  Pulse:  100  Temp: 98.3 F (36.8 C)   TempSrc: Oral   SpO2:  98%  Weight: 101 lb 6.4 oz (46 kg)   Height: 5\' 1"  (1.549 m)    'Gen: Pleasant, thin woman, well-appearing, in no distress,  normal affect  ENT: No lesions,  mouth clear,  oropharynx clear, no postnasal drip  Neck: No JVD, no stridor  Lungs: No use of accessory muscles, clear without rales or rhonchi, no wheeze   Cardiovascular: RRR, heart sounds normal, no murmur or gallops, no peripheral edema  Musculoskeletal: No deformities, no cyanosis or clubbing  Neuro: alert, non focal  Skin: Warm, no lesions or rashes       Assessment & Plan:  Bronchiectasis without acute exacerbation (HCC) Stable clinically, stable by CT March 2020.  Agree with increasing the intensity on her flutter valve, continue to use 2-3 times daily. I will take the hypertonic saline nebulizer medication off your med list since it has not been beneficial. Get the flu shot this fall Follow with Dr Lamonte Sakai in 4 months or sooner if you have any problems.  ALLERGIC RHINITIS, SEASONAL Continue your fluticasone nasal spray, loratadine, nasal saline rinses.  Baltazar Apo, MD, PhD 11/24/2018,  10:34 AM [facility] [phone] or if no answer [phone]

## 2018-11-24 NOTE — Patient Instructions (Signed)
Agree with increasing the intensity on her flutter valve, continue to use 2-3 times daily. Continue your fluticasone nasal spray, loratadine, nasal saline rinses. I will take the hypertonic saline nebulizer medication off your med list since it has not been beneficial. Get the flu shot this fall Follow with Dr Lamonte Sakai in 4 months or sooner if you have any problems.

## 2018-11-24 NOTE — Assessment & Plan Note (Signed)
Continue your fluticasone nasal spray, loratadine, nasal saline rinses.

## 2018-11-24 NOTE — Assessment & Plan Note (Signed)
Stable clinically, stable by CT March 2020.  Agree with increasing the intensity on her flutter valve, continue to use 2-3 times daily. I will take the hypertonic saline nebulizer medication off your med list since it has not been beneficial. Get the flu shot this fall Follow with Dr Lamonte Sakai in 4 months or sooner if you have any problems.

## 2018-12-14 ENCOUNTER — Encounter: Payer: Self-pay | Admitting: Gastroenterology

## 2018-12-28 ENCOUNTER — Telehealth: Payer: Self-pay | Admitting: Emergency Medicine

## 2018-12-28 NOTE — Telephone Encounter (Signed)
Spoke with pt. She has been scheduled for her flu shot on 12/29/2018 at 1400. Nothing further was needed.

## 2018-12-29 ENCOUNTER — Other Ambulatory Visit: Payer: Self-pay

## 2018-12-29 ENCOUNTER — Ambulatory Visit (INDEPENDENT_AMBULATORY_CARE_PROVIDER_SITE_OTHER): Payer: Medicare Other

## 2018-12-29 DIAGNOSIS — Z23 Encounter for immunization: Secondary | ICD-10-CM

## 2018-12-29 NOTE — Progress Notes (Signed)
Patient came to office for high dose flu vaccine. Vaccine given.  Influenza information given.  Nothing further at this time.

## 2019-01-07 ENCOUNTER — Other Ambulatory Visit: Payer: Self-pay

## 2019-01-07 ENCOUNTER — Telehealth (INDEPENDENT_AMBULATORY_CARE_PROVIDER_SITE_OTHER): Payer: Medicare Other | Admitting: Cardiovascular Disease

## 2019-01-07 ENCOUNTER — Ambulatory Visit (INDEPENDENT_AMBULATORY_CARE_PROVIDER_SITE_OTHER): Payer: Medicare Other | Admitting: Internal Medicine

## 2019-01-07 ENCOUNTER — Telehealth: Payer: Medicare Other | Admitting: Cardiovascular Disease

## 2019-01-07 ENCOUNTER — Ambulatory Visit: Payer: Medicare Other | Admitting: Cardiovascular Disease

## 2019-01-07 ENCOUNTER — Encounter: Payer: Self-pay | Admitting: Internal Medicine

## 2019-01-07 ENCOUNTER — Other Ambulatory Visit: Payer: Medicare Other

## 2019-01-07 ENCOUNTER — Telehealth: Payer: Self-pay | Admitting: *Deleted

## 2019-01-07 VITALS — BP 130/68 | HR 107 | Temp 97.8°F | Ht 61.0 in | Wt 101.0 lb

## 2019-01-07 VITALS — BP 130/70 | HR 92 | Ht 61.0 in | Wt 101.0 lb

## 2019-01-07 DIAGNOSIS — Z7901 Long term (current) use of anticoagulants: Secondary | ICD-10-CM

## 2019-01-07 DIAGNOSIS — I480 Paroxysmal atrial fibrillation: Secondary | ICD-10-CM | POA: Diagnosis not present

## 2019-01-07 DIAGNOSIS — R300 Dysuria: Secondary | ICD-10-CM

## 2019-01-07 LAB — POCT URINALYSIS DIPSTICK
Bilirubin, UA: NEGATIVE
Blood, UA: POSITIVE
Glucose, UA: NEGATIVE
Ketones, UA: NEGATIVE
Leukocytes, UA: NEGATIVE
Nitrite, UA: NEGATIVE
Protein, UA: POSITIVE — AB
Spec Grav, UA: 1.025 (ref 1.010–1.025)
Urobilinogen, UA: 0.2 E.U./dL
pH, UA: 6 (ref 5.0–8.0)

## 2019-01-07 MED ORDER — METOPROLOL TARTRATE 25 MG PO TABS: 12.5000 mg | ORAL_TABLET | ORAL | 1 refills | Status: DC | PRN

## 2019-01-07 MED ORDER — FOSFOMYCIN TROMETHAMINE 3 G PO PACK: 3.0000 g | PACK | Freq: Once | ORAL | 0 refills | Status: AC

## 2019-01-07 NOTE — Progress Notes (Signed)
   Subjective:   Patient ID: Monica [name], female    DOB: [birth year], 73 y.o.   MRN: [id number]  HPI The patient is a 73 YO female coming in for concerns about urinary infection. She is having pain with voiding and more frequent voiding. Denies fevers or chills. Denies back pain new or different. Having low abdomen pain/pressure. Denies blood in urine. Denies nausea or vomiting. Allergies/intolerances with several antibiotics. Has a script of doxycycline at home which she offers she could take if needed. Had this back in May and not treated with antibiotics and gradually did get better. Had CT scan but this was done after she was already feeling better but was not revealing.   Review of Systems  Constitutional: Negative.   Respiratory: Negative.   Cardiovascular: Negative.   Gastrointestinal: Positive for abdominal pain. Negative for abdominal distention, constipation, diarrhea, nausea and vomiting.  Genitourinary: Positive for dysuria, frequency and urgency.  Musculoskeletal: Negative.   Skin: Negative.     Objective:  Physical Exam Constitutional:      Appearance: She is well-developed.  HENT:     Head: Normocephalic and atraumatic.  Neck:     Musculoskeletal: Normal range of motion.  Cardiovascular:     Rate and Rhythm: Normal rate and regular rhythm.  Pulmonary:     Effort: Pulmonary effort is normal. No respiratory distress.     Breath sounds: Normal breath sounds. No wheezing or rales.  Abdominal:     General: Bowel sounds are normal. There is no distension.     Palpations: Abdomen is soft.     Tenderness: There is no abdominal tenderness. There is no rebound.     Comments: Suprapubic tenderness  Skin:    General: Skin is warm and dry.  Neurological:     Mental Status: She is alert and oriented to person, place, and time.     Coordination: Coordination normal.     Vitals:   01/07/19 0953  BP: 130/68  Pulse: (!) 107  Temp: 97.8 F (36.6 C)  TempSrc: Oral   SpO2: 96%  Weight: 101 lb (45.8 kg)  Height: 5\' 1"  (1.549 m)    Assessment & Plan:

## 2019-01-07 NOTE — Assessment & Plan Note (Signed)
U/A without signs of infection but given symptoms and recurrence will treat with fosfomycin. Sending for culture to check for bacteria.

## 2019-01-07 NOTE — Telephone Encounter (Signed)
YOUR CARDIOLOGY TEAM HAS ARRANGED FOR AN E-VISIT FOR YOUR APPOINTMENT - PLEASE REVIEW IMPORTANT INFORMATION BELOW SEVERAL DAYS PRIOR TO YOUR APPOINTMENT  Due to the recent COVID-19 pandemic, we are transitioning in-person office visits to tele-medicine visits in an effort to decrease unnecessary exposure to our patients, their families, and staff. These visits are billed to your insurance just like a normal visit is. We also encourage you to sign up for MyChart if you have not already done so. You will need a smartphone if possible. For patients that do not have this, we can still complete the visit using a regular telephone but do prefer a smartphone to enable video when possible. You may have a family member that lives with you that can help. If possible, we also ask that you have a blood pressure cuff and scale at home to measure your blood pressure, heart rate and weight prior to your scheduled appointment. Patients with clinical needs that need an in-person evaluation and testing will still be able to come to the office if absolutely necessary. If you have any questions, feel free to call our office.     YOUR PROVIDER WILL BE USING THE FOLLOWING PLATFORM TO COMPLETE YOUR VISIT: YES  . IF USING MYCHART - How to Download the MyChart App to Your SmartPhone   - If Apple, go to [employer] and type in MyChart in the search bar and download the app. If Android, ask patient to go to [employer] and type in [place] in the search bar and download the app. The app is free but as with any other app downloads, your phone may require you to verify saved payment information or Apple/Android password.  - You will need to then log into the app with your MyChart username and password, and select [facility] as your healthcare provider to link the account.  - When it is time for your visit, go to the MyChart app, find appointments, and click Begin Video Visit. Be sure to Select Allow for your device to access  the Microphone and Camera for your visit. You will then be connected, and your provider will be with you shortly.  **If you have any issues connecting or need assistance, please contact MyChart service desk [phone])**  **If using a computer, in order to ensure the best quality for your visit, you will need to use either of the following Internet Browsers: Insurance underwriter or [employer]**  . IF USING DOXIMITY or DOXY.ME - The staff will give you instructions on receiving your link to join the meeting the day of your visit.      2-3 DAYS BEFORE YOUR APPOINTMENT  You will receive a telephone call from one of our [facility] team members - your caller ID may say "Unknown caller." If this is a video visit, we will walk you through how to get the video launched on your phone. We will remind you check your blood pressure, heart rate and weight prior to your scheduled appointment. If you have an Apple Watch or Kardia, please upload any pertinent ECG strips the day before or morning of your appointment to [facility]. Our staff will also make sure you have reviewed the consent and agree to move forward with your scheduled tele-health visit.     THE DAY OF YOUR APPOINTMENT  Approximately 15 minutes prior to your scheduled appointment, you will receive a telephone call from one of [facility] team - your caller ID may say "Unknown caller."  Our staff will confirm medications, vital signs for the day and any symptoms you may be experiencing. Please have this information available prior to the time of visit start. It may also be helpful for you to have a pad of paper and pen handy for any instructions given during your visit. They will also walk you through joining the smartphone meeting if this is a video visit.    CONSENT FOR TELE-HEALTH VISIT - PLEASE REVIEW  I hereby voluntarily request, consent and authorize [facility] and its employed or contracted physicians, physician assistants,  nurse practitioners or other licensed health care professionals (the Practitioner), to provide me with telemedicine health care services (the "Services") as deemed necessary by the treating Practitioner. I acknowledge and consent to receive the Services by the Practitioner via telemedicine. I understand that the telemedicine visit will involve communicating with the Practitioner through live audiovisual communication technology and the disclosure of certain medical information by electronic transmission. I acknowledge that I have been given the opportunity to request an in-person assessment or other available alternative prior to the telemedicine visit and am voluntarily participating in the telemedicine visit.  I understand that I have the right to withhold or withdraw my consent to the use of telemedicine in the course of my care at any time, without affecting my right to future care or treatment, and that the Practitioner or I may terminate the telemedicine visit at any time. I understand that I have the right to inspect all information obtained and/or recorded in the course of the telemedicine visit and may receive copies of available information for a reasonable fee.  I understand that some of the potential risks of receiving the Services via telemedicine include:  . Delay or interruption in medical evaluation due to technological equipment failure or disruption; . Information transmitted may not be sufficient (e.g. poor resolution of images) to allow for appropriate medical decision making by the Practitioner; and/or  . In rare instances, security protocols could fail, causing a breach of personal health information.  Furthermore, I acknowledge that it is my responsibility to provide information about my medical history, conditions and care that is complete and accurate to the best of my ability. I acknowledge that Practitioner's advice, recommendations, and/or decision may be based on factors not  within their control, such as incomplete or inaccurate data provided by me or distortions of diagnostic images or specimens that may result from electronic transmissions. I understand that the practice of medicine is not an exact science and that Practitioner makes no warranties or guarantees regarding treatment outcomes. I acknowledge that I will receive a copy of this consent concurrently upon execution via email to the email address I last provided but may also request a printed copy by calling the office of [facility].    I understand that my insurance will be billed for this visit.   I have read or had this consent read to me. . I understand the contents of this consent, which adequately explains the benefits and risks of the Services being provided via telemedicine.  . I have been provided ample opportunity to ask questions regarding this consent and the Services and have had my questions answered to my satisfaction. . I give my informed consent for the services to be provided through the use of telemedicine in my medical care  By participating in this telemedicine visit I agree to the above.  

## 2019-01-07 NOTE — Patient Instructions (Signed)
We have sent in the antibiotic medicine to mix with liquid and drink called fosfomycin.   This will clear the infection over the next 3-5 days.   We will do the culture to see if there is an infection and to make sure we are treating it right.

## 2019-01-07 NOTE — Patient Instructions (Signed)

## 2019-01-07 NOTE — Progress Notes (Signed)
Virtual Visit via Video Note   This visit type was conducted due to national recommendations for restrictions regarding the COVID-19 Pandemic (e.g. social distancing) in an effort to limit this patient's exposure and mitigate transmission in our community.  Due to her co-morbid illnesses, this patient is at least at moderate risk for complications without adequate follow up.  This format is felt to be most appropriate for this patient at this time.  All issues noted in this document were discussed and addressed.  A limited physical exam was performed with this format.  Please refer to the patient's chart for her consent to telehealth for [facility].   Date:  01/07/2019   ID:  Monica [name], DOB May 19, 1945, MRN [id number]  Patient Location: Home Provider Location: Home  PCP:  Plotnikov, Evie Lacks, MD  Cardiologist:  Sanda Klein, MD  Electrophysiologist:  None   Evaluation Performed:  Follow-Up Visit  Chief Complaint: Atrial fibrillation  History of Present Illness:    Monica [name] is a 73 y.o. female with palpitations related to infrequent episodes of paroxysmal atrial fibrillation as well as PACs and PVCs, without significant underlying structural heart disease.  From a heart point of view she's done very well and in the recent past only had maybe 10 minutes of atrial fibrillation that occurred several weeks ago.  One bottle of metoprolol tartrate, which she only uses as needed for palpitations, has lasted her for a whole year.    She is tolerating anticoagulation with Eliquis well and has not had any falls, bleeding or injuries.  She has not had any neurological events.    Her husband has been in a memory unit since New Year's Day and unfortunately his dementia is progressing rapidly.  He now has swallowing difficulties and after discussion with Dr. Alain Marion they are planning to call in palliative care.  She has fairly frequent coughing and secretion illumination due to  her bronchiectasis and is using a flutter valve with some success.  She has not had any hemoptysis.  She is planning a colonoscopy with [name] and we discussed management of her anticoagulation around the time of the procedure.  She can safely stop the Eliquis 48 hours before colonoscopy and then resume it based on the recommendations of her gastroenterologist (may resume immediately if she does not have polypectomy although only has small smear of polyps).  The patient specifically denies any chest pain at rest exertion, dyspnea at rest or with exertion, orthopnea, paroxysmal nocturnal dyspnea, syncope, palpitations, focal neurological deficits, intermittent claudication, lower extremity edema, unexplained weight gain, cough, hemoptysis or wheezing.  Other comorbid conditions include bronchiectasis, gastroesophageal reflux disease, irritable bowel syndrome, osteoporosis, chronic sinusitis and mild anxiety disorder.   The patient does not have symptoms concerning for COVID-19 infection (fever, chills, cough, or new shortness of breath).    Past Medical History:  Diagnosis Date  . Adenomatous colon polyp 10/2003  . Allergic rhinitis   . Allergy    SEASONAL  . Bronchiectasis   . Chronic sinusitis   . Diverticulosis   . GERD (gastroesophageal reflux disease)   . Hearing loss   . Hemorrhoids   . Insomnia   . Menopausal disorder   . Osteoporosis   . Palpitations   . Paroxysmal A-fib ([place])   . TMJ syndrome   . Urticaria    Past Surgical History:  Procedure Laterality Date  . COLONOSCOPY    . NASAL SINUS SURGERY    . TYMPANOSTOMY    .  VIDEO BRONCHOSCOPY Bilateral 07/16/2016   Procedure: VIDEO BRONCHOSCOPY WITH FLUORO;  Surgeon: Collene Gobble, MD;  Location: [facility];  Service: Cardiopulmonary;  Laterality: Bilateral;     Current Meds  Medication Sig  . acetaminophen (TYLENOL) 500 MG tablet Take 500 mg by mouth every 6 (six) hours as needed for moderate pain or headache.  .  calcium carbonate (TUMS - DOSED IN MG ELEMENTAL CALCIUM) 500 MG chewable tablet Chew 1 tablet by mouth as needed for indigestion or heartburn.  Marland Kitchen CALCIUM-VITAMIN D PO Take 1 tablet by mouth daily.  . Cholecalciferol (VITAMIN D) 1000 UNITS capsule Take 1,000 Units by mouth daily.    Marland Kitchen dextromethorphan (DELSYM) 30 MG/5ML liquid Take 15-30 mLs 2 (two) times daily as needed by mouth for cough.  Marland Kitchen ELIQUIS 5 MG TABS tablet TAKE 1 TABLET TWICE A DAY (Patient taking differently: Take 5 mg by mouth 2 (two) times daily. )  . Estradiol 10 MCG TABS Place 10 mcg 2 (two) times a week vaginally. Pt does not have set days  . fluticasone (FLONASE) 50 MCG/ACT nasal spray Place 2 sprays into both nostrils at bedtime as needed for allergies or rhinitis.  Marland Kitchen guaiFENesin (MUCINEX) 600 MG 12 hr tablet Take 600 mg by mouth 2 (two) times daily as needed for cough or to loosen phlegm.   . loratadine (CLARITIN) 10 MG tablet Take 10 mg daily as needed by mouth for allergies.   Marland Kitchen MAGNESIUM PO Take 250 mg by mouth daily.   . metoprolol tartrate (LOPRESSOR) 25 MG tablet Take 12.5 mg by mouth as needed.  . Multiple Vitamins-Minerals (CENTRUM SILVER PO) Take 1 tablet by mouth daily.   . Omeprazole 20 MG TBDD as needed.      Allergies:   Albuterol, Macrobid [nitrofurantoin macrocrystal], Amoxicillin-pot clavulanate, Avelox [moxifloxacin hcl in nacl], Bactrim [sulfamethoxazole-trimethoprim], Benzonatate, and Ciprofloxacin   Social History   Tobacco Use  . Smoking status: Never Smoker  . Smokeless tobacco: Never Used  Substance Use Topics  . Alcohol use: Not Currently    Alcohol/week: 2.0 standard drinks    Types: 2 Glasses of wine per week  . Drug use: No     Family Hx: The patient's family history includes AVM in her brother; Cancer in her maternal grandmother, mother, and paternal uncle; Colon cancer (age of onset: 1) in her mother; Heart disease in her father and maternal grandfather; Hypertension in her father;  Rectal cancer in her mother; Seizures in her mother.  ROS:   Please see the history of present illness.     All other systems reviewed and are negative.   Prior CV studies:   The following studies were reviewed today: Event monitor January 2020 showing very brief atrial fibrillation and frequent PACs Echo 2017 shows normal left ventricular systolic function, no valvular abnormalities, normal left atrial size  Labs/Other Tests and Data Reviewed:    EKG:  An ECG dated 06/06/2018 was personally reviewed today and demonstrated:  Sinus tachycardia, rSr' pattern in leads V1-V2, otherwise normal tracing  Recent Labs: 06/07/2018: Magnesium 2.2 07/06/2018: TSH 6.19 09/21/2018: ALT 11; BUN 25; Creatinine, Ser 0.71; Hemoglobin 13.7; Platelets 349.0; Potassium 4.5; Sodium 138   Recent Lipid Panel Lab Results  Component Value Date/Time   CHOL 140 07/06/2018 07:55 AM   TRIG 41.0 07/06/2018 07:55 AM   HDL 62.80 07/06/2018 07:55 AM   CHOLHDL 2 07/06/2018 07:55 AM   LDLCALC 69 07/06/2018 07:55 AM    Wt Readings from Last 3 Encounters:  01/07/19 101 lb (45.8 kg)  01/07/19 101 lb (45.8 kg)  11/24/18 101 lb 6.4 oz (46 kg)     Objective:    Vital Signs:  BP 130/70   Pulse 92   Ht 5\' 1"  (1.549 m)   Wt 101 lb (45.8 kg)   BMI 19.08 kg/m    VITAL SIGNS:  reviewed GEN:  no acute distress EYES:  sclerae anicteric, EOMI - Extraocular Movements Intact RESPIRATORY:  normal respiratory effort, symmetric expansion CARDIOVASCULAR:  no peripheral edema SKIN:  no rash, lesions or ulcers. MUSCULOSKELETAL:  no obvious deformities. NEURO:  alert and oriented x 3, no obvious focal deficit PSYCH:  normal affect  ASSESSMENT & PLAN:    1. AFib: Infrequent, minimally symptomatic, appropriately anticoagulated.  Taking metoprolol as needed only. 2. Anticoagulation: Well-tolerated, no bleeding complications.  She should stop the Eliquis 48 hours before the colonoscopy and then restart after the procedure  based on the recommendations of her gastroenterologist.  COVID-19 Education: The signs and symptoms of COVID-19 were discussed with the patient and how to seek care for testing (follow up with PCP or arrange E-visit).  The importance of social distancing was discussed today.  Time:   Today, I have spent 19 minutes with the patient with telehealth technology discussing the above problems.     Medication Adjustments/Labs and Tests Ordered: Current medicines are reviewed at length with the patient today.  Concerns regarding medicines are outlined above.   Tests Ordered: No orders of the defined types were placed in this encounter.   Medication Changes: No orders of the defined types were placed in this encounter.   Follow Up:  Virtual Visit or In Person 1 year  Signed, Sanda Klein, MD  01/07/2019 1:25 PM    [place]

## 2019-01-08 LAB — URINE CULTURE
MICRO NUMBER:: 844377
SPECIMEN QUALITY:: ADEQUATE

## 2019-01-18 ENCOUNTER — Ambulatory Visit: Payer: Medicare Other | Admitting: Internal Medicine

## 2019-02-25 DIAGNOSIS — M79641 Pain in right hand: Secondary | ICD-10-CM | POA: Diagnosis not present

## 2019-03-01 DIAGNOSIS — Z03818 Encounter for observation for suspected exposure to other biological agents ruled out: Secondary | ICD-10-CM | POA: Diagnosis not present

## 2019-03-02 ENCOUNTER — Telehealth: Payer: Self-pay | Admitting: Internal Medicine

## 2019-03-02 NOTE — Telephone Encounter (Signed)
Rec'd from [facility] forwarded 2 pages to Dr. Lew Dawes

## 2019-03-12 DIAGNOSIS — Z01419 Encounter for gynecological examination (general) (routine) without abnormal findings: Secondary | ICD-10-CM | POA: Diagnosis not present

## 2019-03-12 DIAGNOSIS — N952 Postmenopausal atrophic vaginitis: Secondary | ICD-10-CM | POA: Diagnosis not present

## 2019-03-12 DIAGNOSIS — Z124 Encounter for screening for malignant neoplasm of cervix: Secondary | ICD-10-CM | POA: Diagnosis not present

## 2019-03-24 ENCOUNTER — Telehealth (INDEPENDENT_AMBULATORY_CARE_PROVIDER_SITE_OTHER): Payer: Medicare Other | Admitting: Adult Health

## 2019-03-24 ENCOUNTER — Encounter: Payer: Self-pay | Admitting: Adult Health

## 2019-03-24 ENCOUNTER — Telehealth: Payer: Self-pay | Admitting: Adult Health

## 2019-03-24 DIAGNOSIS — R059 Cough, unspecified: Secondary | ICD-10-CM

## 2019-03-24 DIAGNOSIS — R05 Cough: Secondary | ICD-10-CM

## 2019-03-24 MED ORDER — AZITHROMYCIN 250 MG PO TABS: ORAL_TABLET | ORAL | 0 refills | Status: DC

## 2019-03-24 NOTE — Patient Instructions (Addendum)
COVID 19 testing .  Zpack - to have on hold if symptoms worsen with discolored mucus or do not improved.  Saline nasal rinses and gel As needed   Increase Flutter valve 2-3 times a day .  Mucinex Twice daily  .As needed  Congestion  Push Fluids and rest  Healthy diet .  Continue on Flonase and Claritin. Follow up with Dr. Lamonte Sakai  In 2 weeks as planned  and as needed Please contact office for sooner follow up if symptoms do not improve or worsen or seek emergency care

## 2019-03-24 NOTE — Telephone Encounter (Signed)
Spoke with pharmacist  She states that she is needing dx code for zpack  I gave dx of J47.9- bronchiectasis  Nothing further needed

## 2019-03-24 NOTE — Progress Notes (Signed)
Virtual Visit via Telephone Note  I connected with Monica [name] on 03/24/19 at 10:00 AM EST by telephone and verified that I am speaking with the correct person using two identifiers.  Location: Patient: Home  Provider: Office    I discussed the limitations, risks, security and privacy concerns of performing an evaluation and management service by telephone and the availability of in person appointments. I also discussed with the patient that there may be a patient responsible charge related to this service. The patient expressed understanding and agreed to proceed.   History of Present Illness: 73 yo female never smoker followed for Bronchiectasis with recurrent exacerbation , [place] , GERD and urticaria  Medical history significant  A Fib on Eliquis , multiple antibiotic allergies   Today's televisit is for an acute office visit . Complains of 5 days cough , congestion , minimally productive. Using flutter valve. No fever. No known sick contacts . No loss of smell or taste.  Does feel very tired with low energy . Has body aches. No fever. No longer on hypertonic nebs , no perceived benefit. No travel or recent antibiotic use.  Is going to Tai chi -mask /face shield.    Observations/Objective: HR 84bpm, O2 sats 97% on room air, Temp 98.4   BAL 07/2016 -Psuedomonas (pan sensitive)   CT chest July 10, 2018 Bronchiectasis, peribronchovascular nodularity/consolidation and volume loss in the lungs bilaterally, with some areas of minimal improvement. 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  Assessment and Plan: Acute Bronchitis /URI /early bronchiectasis flare  Hold off on antibiotics if possible as intolerant . GI prophylaxis discussed with abx.  Low suspicion for COVID but will send for testing as high risk and w/ acute resp sx.   Plan  Patient Instructions  COVID 19 testing .  Zpack - to have on hold if symptoms worsen with discolored mucus or do not improved.   Saline nasal rinses and gel As needed   Increase Flutter valve 2-3 times a day .  Mucinex Twice daily  .As needed  Congestion  Push Fluids and rest  Healthy diet .  Continue on Flonase and Claritin. Follow up with Dr. Lamonte Sakai  In 2 weeks as planned  and as needed Please contact office for sooner follow up if symptoms do not improve or worsen or seek emergency care       Follow Up Instructions: Follow up in 2 weeks and As needed   Please contact office for sooner follow up if symptoms do not improve or worsen or seek emergency care     I discussed the assessment and treatment plan with the patient. The patient was provided an opportunity to ask questions and all were answered. The patient agreed with the plan and demonstrated an understanding of the instructions.   The patient was advised to call back or seek an in-person evaluation if the symptoms worsen or if the condition fails to improve as anticipated.  I provided  25  minutes of non-face-to-face time during this encounter.   Rexene Edison, NP

## 2019-03-25 NOTE — Telephone Encounter (Signed)
FYI - Tammy

## 2019-03-25 NOTE — Telephone Encounter (Signed)
Okay please continue with pulmonary regimen and Zpack . Call if not improving , may take few days to start improving .  If worse please call sooner.  Please contact office for sooner follow up if symptoms do not improve or worsen or seek emergency care   Self quarantine at home and social distance .

## 2019-03-29 ENCOUNTER — Telehealth (INDEPENDENT_AMBULATORY_CARE_PROVIDER_SITE_OTHER): Payer: Medicare Other | Admitting: Adult Health

## 2019-03-29 ENCOUNTER — Encounter: Payer: Self-pay | Admitting: Adult Health

## 2019-03-29 ENCOUNTER — Telehealth: Payer: Medicare Other | Admitting: Adult Health

## 2019-03-29 DIAGNOSIS — B370 Candidal stomatitis: Secondary | ICD-10-CM | POA: Diagnosis not present

## 2019-03-29 DIAGNOSIS — J471 Bronchiectasis with (acute) exacerbation: Secondary | ICD-10-CM | POA: Diagnosis not present

## 2019-03-29 DIAGNOSIS — J301 Allergic rhinitis due to pollen: Secondary | ICD-10-CM | POA: Diagnosis not present

## 2019-03-29 MED ORDER — CLOTRIMAZOLE 10 MG MT TROC: 10.0000 mg | Freq: Every day | OROMUCOSAL | 0 refills | Status: DC

## 2019-03-29 NOTE — Telephone Encounter (Signed)
mycelex troches five daily for 1 week #35 , no refills.   Eat yogurt daily .   Continue to treat symptoms . Follow up for COVID testing   Please contact office for sooner follow up if symptoms do not improve or worsen or seek emergency care

## 2019-03-29 NOTE — Patient Instructions (Signed)
Mycelex troches five times daly for 1 week .  Please go for COVID 19 testing .  Saline nasal rinses and gel As needed   Increase Flutter valve 2-3 times a day .  Mucinex Twice daily  .As needed  Congestion  Push Fluids and rest  Healthy diet .  Continue on Flonase and Claritin. Follow up with Dr. Lamonte Sakai  In 2 weeks as planned  and as needed Please contact office for sooner follow up if symptoms do not improve or worsen or seek emergency care

## 2019-03-29 NOTE — Progress Notes (Signed)
Virtual Visit via Video Note  I connected with Monica [name] on 03/29/19 at  1:30 PM EST by a video enabled telemedicine application and verified that I am speaking with the correct person using two identifiers.  Location: Patient: Home  Provider: Office    I discussed the limitations of evaluation and management by telemedicine and the availability of in person appointments. The patient expressed understanding and agreed to proceed.  History of Present Illness: 73 year old female never smoker followed for bronchiectasis with recurrent exacerbations, allergic rhinitis, GERD and urticaria Medical history significant for A. fib on Eliquis and multiple antibiotic allergies and intolerances  Today's televisit as an acute office visit for sore throat and tongue.  Patient was seen last week for a virtual visit for acute bronchiectatic flare.  She had a 5-day history of cough and congestion.  She was given a Z-Pak, recommended for aggressive pulmonary hygiene and referred for COVID-19 testing.  She says that she took 2 days of Z-Pak and had one episode of diarrhea after taking.  Says she stopped the azithromycin.  She says that she had felt tired and had things that she had to do so has not been able to go to COVID-19 testing.  She says that she has had no fever.  Has no loss of taste or smell.  She says her breathing is doing about the same.  She does feel fatigued.  She says that now she has a coating on her tongue and irritation in her mouth and the sides of her lips and has a sore throat.  She denies any hemoptysis, chest pain, orthopnea or increased leg swelling.   Observations/Objective:  Appears comfortable with no increased work of breathing.  She talks in full sentences. Visualization of her tongue unable to see any specific white patches although difficult to visualize virtually.   Assessment and Plan: Acute bronchitis/URI possible early bronchiectatic flare.  Patient has significant  intolerances to antibiotics.  She has no discolored mucus or fever.  For now we will hold on additional antibiotics.  Have her continue her aggressive pulmonary hygiene regimen.  Get COVID-19 testing. Follow-up in the office as planned and as needed  Possible oral candidiasis-we will use Mycelex  PLAN  Patient Instructions  Mycelex troches five times daly for 1 week .  Please go for COVID 19 testing .  Saline nasal rinses and gel As needed   Increase Flutter valve 2-3 times a day .  Mucinex Twice daily  .As needed  Congestion  Push Fluids and rest  Healthy diet .  Continue on Flonase and Claritin. Follow up with Dr. Lamonte Sakai  In 2 weeks as planned  and as needed Please contact office for sooner follow up if symptoms do not improve or worsen or seek emergency care        Follow Up Instructions: Follow up with Dr. Lamonte Sakai  In 2 weeks and As needed   Please contact office for sooner follow up if symptoms do not improve or worsen or seek emergency care     I discussed the assessment and treatment plan with the patient. The patient was provided an opportunity to ask questions and all were answered. The patient agreed with the plan and demonstrated an understanding of the instructions.   The patient was advised to call back or seek an in-person evaluation if the symptoms worsen or if the condition fails to improve as anticipated.  I provided 22  minutes of non-face-to-face time during this encounter.  Rexene Edison, NP

## 2019-03-29 NOTE — Telephone Encounter (Signed)
Patient already scheduled for 1:30 appointment.

## 2019-03-30 ENCOUNTER — Other Ambulatory Visit: Payer: Self-pay

## 2019-03-30 DIAGNOSIS — Z20828 Contact with and (suspected) exposure to other viral communicable diseases: Secondary | ICD-10-CM | POA: Diagnosis not present

## 2019-03-30 DIAGNOSIS — Z20822 Contact with and (suspected) exposure to covid-19: Secondary | ICD-10-CM

## 2019-04-01 LAB — NOVEL CORONAVIRUS, NAA: SARS-CoV-2, NAA: NOT DETECTED

## 2019-04-05 ENCOUNTER — Ambulatory Visit (INDEPENDENT_AMBULATORY_CARE_PROVIDER_SITE_OTHER): Payer: Medicare Other | Admitting: Emergency Medicine

## 2019-04-05 ENCOUNTER — Encounter: Payer: Self-pay | Admitting: Emergency Medicine

## 2019-04-05 ENCOUNTER — Other Ambulatory Visit: Payer: Self-pay

## 2019-04-05 DIAGNOSIS — R059 Cough, unspecified: Secondary | ICD-10-CM

## 2019-04-05 DIAGNOSIS — R05 Cough: Secondary | ICD-10-CM | POA: Diagnosis not present

## 2019-04-05 NOTE — Progress Notes (Signed)
Virtual Visit via Telephone Note  I connected with Monica [name] on 04/05/19 at 10:15 AM EST by telephone and verified that I am speaking with the correct person using two identifiers.  Location: Patient: Home  Provider: Office    I discussed the limitations, risks, security and privacy concerns of performing an evaluation and management service by telephone and the availability of in person appointments. I also discussed with the patient that there may be a patient responsible charge related to this service. The patient expressed understanding and agreed to proceed.  History of Present Illness: 73 year old woman with allergic rhinitis, GERD, bronchiectasis with chronic persistent cough.  No evidence of obstruction on a methacholine 2018.   Observations/Objective: She has been flaring approximately 2-3x year. She began to have increased cough in absence of much increase nasal mucous. She has nausea, headache. Non-productive cough. Also fatigue. Sore throat. She took 3 days azithro - stopped due to diarrhea. She then had some tongue discoloration, soreness concerning for possible thrush.   COVID negative on 11/24. She is off flonase, has been off/on omeprazole.    Assessment and Plan: Continue omeprazole and [place], restart flonase. Continue flutter.  symptomatic relief for what I believe is probably a viral process (COVID was negative 11/24). If no improvement then we will need to broaden investigation for cause of these fairly non-specific sx.   Follow Up Instructions: Follow up phone or in-person visit next week.    I discussed the assessment and treatment plan with the patient. The patient was provided an opportunity to ask questions and all were answered. The patient agreed with the plan and demonstrated an understanding of the instructions.   The patient was advised to call back or seek an in-person evaluation if the symptoms worsen or if the condition fails to improve as  anticipated.  I provided 20 minutes of non-face-to-face time during this encounter.   Collene Gobble, MD

## 2019-04-07 ENCOUNTER — Ambulatory Visit: Payer: Medicare Other | Admitting: Physician Assistant

## 2019-04-08 ENCOUNTER — Ambulatory Visit (INDEPENDENT_AMBULATORY_CARE_PROVIDER_SITE_OTHER): Payer: Medicare Other | Admitting: Internal Medicine

## 2019-04-08 ENCOUNTER — Encounter: Payer: Self-pay | Admitting: Internal Medicine

## 2019-04-08 DIAGNOSIS — B370 Candidal stomatitis: Secondary | ICD-10-CM | POA: Diagnosis not present

## 2019-04-08 DIAGNOSIS — R05 Cough: Secondary | ICD-10-CM

## 2019-04-08 DIAGNOSIS — R059 Cough, unspecified: Secondary | ICD-10-CM

## 2019-04-08 DIAGNOSIS — J479 Bronchiectasis, uncomplicated: Secondary | ICD-10-CM

## 2019-04-08 MED ORDER — DOXYCYCLINE HYCLATE 100 MG PO TABS: 100.0000 mg | ORAL_TABLET | Freq: Two times a day (BID) | ORAL | 0 refills | Status: DC

## 2019-04-08 NOTE — Assessment & Plan Note (Signed)
Doxy x 2 wks Diflucan if needed

## 2019-04-08 NOTE — Assessment & Plan Note (Signed)
Just finished Mycelex

## 2019-04-08 NOTE — Progress Notes (Signed)
Virtual Visit via Video Note  I connected with Monica [name] on 04/08/19 at  2:00 PM EST by a video enabled telemedicine application and verified that I am speaking with the correct person using two identifiers.   I discussed the limitations of evaluation and management by telemedicine and the availability of in person appointments. The patient expressed understanding and agreed to proceed.  History of Present Illness: C/o cough, fatigue, ST x 3-4 wks.C/o HA, nausea, insomnia. Pulse ox 97% RA Pt did VOV w/T [facility] and w/Dr [name]. Took Zpac, Mycelex COVID 19 test was (-) A complex situation  There has been no fever, abdominal pain, diarrhea, constipation, arthralgias, skin rashes.   Observations/Objective: The patient appears to be in no acute distress.  Assessment and Plan:  See my Assessment and Plan. Follow Up Instructions:    I discussed the assessment and treatment plan with the patient. The patient was provided an opportunity to ask questions and all were answered. The patient agreed with the plan and demonstrated an understanding of the instructions.   The patient was advised to call back or seek an in-person evaluation if the symptoms worsen or if the condition fails to improve as anticipated.  I provided face-to-face time during this encounter. We were at different locations.   Walker Kehr, MD

## 2019-04-08 NOTE — Assessment & Plan Note (Signed)
Treat bronchiectases Medrol pak if not better [place]uka honey, Zycam for sore throat

## 2019-04-15 DIAGNOSIS — Z9622 Myringotomy tube(s) status: Secondary | ICD-10-CM | POA: Diagnosis not present

## 2019-04-15 DIAGNOSIS — J029 Acute pharyngitis, unspecified: Secondary | ICD-10-CM | POA: Diagnosis not present

## 2019-04-15 DIAGNOSIS — Z7289 Other problems related to lifestyle: Secondary | ICD-10-CM | POA: Diagnosis not present

## 2019-04-15 DIAGNOSIS — H6981 Other specified disorders of Eustachian tube, right ear: Secondary | ICD-10-CM | POA: Diagnosis not present

## 2019-04-15 DIAGNOSIS — H6121 Impacted cerumen, right ear: Secondary | ICD-10-CM | POA: Insufficient documentation

## 2019-05-27 ENCOUNTER — Ambulatory Visit: Payer: Medicare Other | Attending: Internal Medicine

## 2019-05-27 DIAGNOSIS — Z23 Encounter for immunization: Secondary | ICD-10-CM

## 2019-05-27 NOTE — Progress Notes (Signed)
   Covid-19 Vaccination Clinic  Name:  Monica [name]    MRN: [id number] DOB: 03-05-1946  05/27/2019  [name] was observed post Covid-19 immunization for 15 minutes without incidence. She was provided with Vaccine Information Sheet and instruction to access the V-Safe system.   Ms. Pick was instructed to call 911 with any severe reactions post vaccine: Marland Kitchen Difficulty breathing  . Swelling of your face and throat  . A fast heartbeat  . A bad rash all over your body  . Dizziness and weakness    Immunizations Administered    Name Date Dose VIS Date Route   Pfizer COVID-19 Vaccine 05/27/2019  2:51 PM 0.3 mL 04/16/2019 Intramuscular   Manufacturer: [place]   Lot: [id number]   [place]: [id number]

## 2019-05-28 ENCOUNTER — Other Ambulatory Visit: Payer: Self-pay | Admitting: Cardiovascular Disease

## 2019-05-28 NOTE — Telephone Encounter (Signed)
73 F 45.8 kg, SCr 0.71 (5/20), LOV 9/20 [place]

## 2019-06-16 DIAGNOSIS — L821 Other seborrheic keratosis: Secondary | ICD-10-CM | POA: Diagnosis not present

## 2019-06-16 DIAGNOSIS — Z411 Encounter for cosmetic surgery: Secondary | ICD-10-CM | POA: Diagnosis not present

## 2019-06-16 DIAGNOSIS — L814 Other melanin hyperpigmentation: Secondary | ICD-10-CM | POA: Diagnosis not present

## 2019-06-16 DIAGNOSIS — Z86018 Personal history of other benign neoplasm: Secondary | ICD-10-CM | POA: Diagnosis not present

## 2019-06-16 DIAGNOSIS — L578 Other skin changes due to chronic exposure to nonionizing radiation: Secondary | ICD-10-CM | POA: Diagnosis not present

## 2019-06-16 DIAGNOSIS — D2262 Melanocytic nevi of left upper limb, including shoulder: Secondary | ICD-10-CM | POA: Diagnosis not present

## 2019-06-16 DIAGNOSIS — Z23 Encounter for immunization: Secondary | ICD-10-CM | POA: Diagnosis not present

## 2019-06-16 DIAGNOSIS — L309 Dermatitis, unspecified: Secondary | ICD-10-CM | POA: Diagnosis not present

## 2019-06-17 ENCOUNTER — Ambulatory Visit: Payer: Medicare Other | Attending: Internal Medicine

## 2019-06-17 DIAGNOSIS — Z23 Encounter for immunization: Secondary | ICD-10-CM | POA: Insufficient documentation

## 2019-06-17 NOTE — Progress Notes (Signed)
   Covid-19 Vaccination Clinic  Name:  Monica [name]    MRN: [id number] DOB: 1945/08/25  06/17/2019  Monica [name] was observed post Covid-19 immunization for 15 minutes without incidence. She was provided with Vaccine Information Sheet and instruction to access the V-Safe system.   Monica [name] was instructed to call 911 with any severe reactions post vaccine: Marland Kitchen Difficulty breathing  . Swelling of your face and throat  . A fast heartbeat  . A bad rash all over your body  . Dizziness and weakness    Immunizations Administered    Name Date Dose VIS Date Route   Pfizer COVID-19 Vaccine 06/17/2019  3:38 PM 0.3 mL 04/16/2019 Intramuscular   Manufacturer: [place]   Lot: [id number]   [place]: [id number]

## 2019-07-14 ENCOUNTER — Ambulatory Visit (INDEPENDENT_AMBULATORY_CARE_PROVIDER_SITE_OTHER): Payer: Medicare Other | Admitting: Physician Assistant

## 2019-07-14 ENCOUNTER — Telehealth: Payer: Self-pay | Admitting: *Deleted

## 2019-07-14 ENCOUNTER — Encounter: Payer: Self-pay | Admitting: Physician Assistant

## 2019-07-14 VITALS — BP 110/60 | HR 94 | Temp 97.3°F | Ht 61.75 in | Wt 101.0 lb

## 2019-07-14 DIAGNOSIS — Z800 Family history of malignant neoplasm of digestive organs: Secondary | ICD-10-CM

## 2019-07-14 DIAGNOSIS — Z86010 Personal history of colonic polyps: Secondary | ICD-10-CM

## 2019-07-14 DIAGNOSIS — K219 Gastro-esophageal reflux disease without esophagitis: Secondary | ICD-10-CM

## 2019-07-14 DIAGNOSIS — K5900 Constipation, unspecified: Secondary | ICD-10-CM | POA: Diagnosis not present

## 2019-07-14 DIAGNOSIS — Z860101 Personal history of adenomatous and serrated colon polyps: Secondary | ICD-10-CM

## 2019-07-14 DIAGNOSIS — K7689 Other specified diseases of liver: Secondary | ICD-10-CM

## 2019-07-14 MED ORDER — NA SULFATE-K SULFATE-MG SULF 17.5-3.13-1.6 GM/177ML PO SOLN: 1.0000 | Freq: Once | ORAL | 0 refills | Status: AC

## 2019-07-14 NOTE — Addendum Note (Signed)
Addended by: Horris Latino on: 07/14/2019 04:49 PM   Modules accepted: Orders

## 2019-07-14 NOTE — Telephone Encounter (Signed)
[facility] Pre-operative Risk Assessment     Request for surgical clearance:     Endoscopy Procedure  What type of surgery is being performed?     EGD/Colonoscopy  When is this surgery scheduled?     Monday 08/16/19  What type of clearance is required ?   Pharmacy  Are there any medications that need to be held prior to surgery and how long? Eliquis 2 days  Practice name and name of physician performing surgery?      [facility]  What is your office phone and fax number?      Phone- [phone]  [phone] or [phone]  Anesthesia type (None, local, MAC, general) ?       MAC

## 2019-07-14 NOTE — Patient Instructions (Signed)
If you are age 75 or older, your body mass index should be between 23-30. Your Body mass index is 18.62 kg/m. If this is out of the aforementioned range listed, please consider follow up with your Primary Care Provider.  If you are age 16 or younger, your body mass index should be between 19-25. Your Body mass index is 18.62 kg/m. If this is out of the aformentioned range listed, please consider follow up with your Primary Care Provider.   You have been scheduled for an endoscopy and colonoscopy. Please follow the written instructions given to you at your visit today. Please pick up your prep supplies at the pharmacy within the next 1-3 days. If you use inhalers (even only as needed), please bring them with you on the day of your procedure.   Use Miralax in addition to fiber as needed for constipation.   You will be contacted by our office prior to your procedure for directions on holding your Eliquis.  If you do not hear from our office 1 week prior to your scheduled procedure, please call [phone] to discuss.

## 2019-07-14 NOTE — Telephone Encounter (Signed)
   Primary Cardiologist: Sanda Klein, MD  Chart reviewed as part of pre-operative protocol coverage.   Per office visit with Dr. Sallyanne Kuster on 01/07/2019, she can safely stop the Eliquis 48 hours before colonoscopy and then resume it based on the recommendations of her gastroenterologist (may resume immediately if she does not have polypectomy although only has small smear of polyps). I called the patient to ensure that there have been no changes since that day and can continue with this plan.  I will route this recommendation to the requesting party via Epic fax function and remove from pre-op pool.  Please call with questions.  Daune Perch, NP 07/14/2019, 10:24 AM

## 2019-07-14 NOTE — Progress Notes (Signed)
Reviewed and agree with management plan.  Lorenza Shakir T. Hussien Greenblatt, MD FACG [facility]  

## 2019-07-14 NOTE — Progress Notes (Signed)
Chief Complaint: Discuss colonoscopy on blood thinner, constipation, liver cyst and GERD  HPI:    Monica [name] is a 74 year old Caucasian female with a past medical history as listed below including A. fib on Eliquis (echo 10/06/2015 with LVEF 55-60%), known to [name], who was referred to me by Plotnikov, Evie Lacks, MD for a complaint of discussion of a colonoscopy and blood thinner, constipation, liver cyst and GERD.      01/14/2014 colonoscopy done for personal history of adenomatous polyps and family history of colon cancer.  Mild diverticulosis in the descending and sigmoid colon and otherwise normal.  Repeat recommended in 5 years.    10/23/2018 CT abdomen pelvis with contrast with subcentimeter cyst in segment 4A of the liver.  No acute findings.  Scattered colonic diverticula.    Today, the patient tells me that she has had some trouble with constipation lately noting that typically she just adds fiber in with her Citrucel, but even doing this left her with no bowel movement for about a week.  She then slowly started having bowel movements again but does not want to do this again.  Associated symptoms include bloating.    Also complains today of reflux, she already sleeps with the head of her bed elevated and has been on and off PPIs.  Tells me she has a history of Osteoporosis and due to this has not been on anything chronically.  Typically for her symptoms are just mucus in her throat which does not completely go away even when she is on the medication, so she does not think about it much.  Recently though diagnosed with bronchiectasis which was thought possibly related to reflux symptoms though tells me she is somewhat worried about this.    Also tells me today that she had imaging as above back in June which saw a liver cyst.  She wonders if this is something to be worried about.    Anxious about coming off of her Eliquis for procedures.    Patient has already had both of her Covid vaccines,  the last one a month ago.    Denies fever, chills, weight loss, blood in her stool or symptoms that awaken her from sleep.  Past Medical History:  Diagnosis Date  . Adenomatous colon polyp 10/2003  . Allergic rhinitis   . Allergy    SEASONAL  . Bronchiectasis   . Chronic sinusitis   . Diverticulosis   . GERD (gastroesophageal reflux disease)   . Hearing loss   . Hemorrhoids   . Insomnia   . Menopausal disorder   . Osteoporosis   . Palpitations   . Paroxysmal A-fib ([place])   . TMJ syndrome   . Urticaria     Past Surgical History:  Procedure Laterality Date  . COLONOSCOPY    . NASAL SINUS SURGERY    . TYMPANOSTOMY    . VIDEO BRONCHOSCOPY Bilateral 07/16/2016   Procedure: VIDEO BRONCHOSCOPY WITH FLUORO;  Surgeon: Collene Gobble, MD;  Location: [facility];  Service: Cardiopulmonary;  Laterality: Bilateral;    Current Outpatient Medications  Medication Sig Dispense Refill  . acetaminophen (TYLENOL) 500 MG tablet Take 500 mg by mouth every 6 (six) hours as needed for moderate pain or headache.    . calcium carbonate (TUMS - DOSED IN MG ELEMENTAL CALCIUM) 500 MG chewable tablet Chew 1 tablet by mouth as needed for indigestion or heartburn.    Marland Kitchen CALCIUM-VITAMIN D PO Take 1 tablet by mouth daily.    Marland Kitchen  Cholecalciferol (VITAMIN D) 1000 UNITS capsule Take 1,000 Units by mouth daily.      Marland Kitchen dextromethorphan (DELSYM) 30 MG/5ML liquid Take 15-30 mLs 2 (two) times daily as needed by mouth for cough.    . doxycycline (VIBRA-TABS) 100 MG tablet Take 1 tablet (100 mg total) by mouth 2 (two) times daily. 28 tablet 0  . ELIQUIS 5 MG TABS tablet TAKE 1 TABLET TWICE A DAY 180 tablet 1  . Estradiol 10 MCG TABS Place 10 mcg 2 (two) times a week vaginally. Pt does not have set days    . fluticasone (FLONASE) 50 MCG/ACT nasal spray Place 2 sprays into both nostrils at bedtime as needed for allergies or rhinitis.    Marland Kitchen guaiFENesin (MUCINEX) 600 MG 12 hr tablet Take 600 mg by mouth 2 (two) times daily as  needed for cough or to loosen phlegm.     . Lactobacillus Rhamnosus, GG, (CULTURELLE PO) Take 1 tablet by mouth daily.    Marland Kitchen loratadine (CLARITIN) 10 MG tablet Take 10 mg daily as needed by mouth for allergies.     . metoprolol tartrate (LOPRESSOR) 25 MG tablet Take 0.5 tablets (12.5 mg total) by mouth as needed. 15 tablet 1  . Multiple Vitamins-Minerals (CENTRUM SILVER PO) Take 1 tablet by mouth daily.     . Omeprazole 20 MG TBDD as needed.     Marland Kitchen Respiratory Therapy Supplies (FLUTTER) DEVI Twice a day and prn as needed, may increase if feeling worse 1 each 0   No current facility-administered medications for this visit.    Allergies as of 07/14/2019 - Review Complete 07/14/2019  Allergen Reaction Noted  . Albuterol Other (See Comments) 08/04/2017  . Macrobid [nitrofurantoin macrocrystal]  09/21/2018  . Amoxicillin-pot clavulanate Diarrhea   . Avelox [moxifloxacin hcl in nacl] Nausea Only 11/25/2012  . Bactrim [sulfamethoxazole-trimethoprim] Other (See Comments)   . Benzonatate Other (See Comments) 10/20/2009  . Ciprofloxacin Rash 03/20/2015    Family History  Problem Relation Age of Onset  . Rectal cancer Mother        died age 30  . Seizures Mother   . Colon cancer Mother 78  . Cancer Mother   . Heart disease Father        dies age 77  . Hypertension Father   . Heart disease Maternal Grandfather   . AVM Brother   . Cancer Maternal Grandmother   . Cancer Paternal Uncle        multiple uncles with cancer    Social History   Socioeconomic History  . Marital status: Married    Spouse name: [name]  . Number of children: 0  . Years of education: BSN  . Highest education level: Not on file  Occupational History  . Occupation: retired Therapist, sports  Tobacco Use  . Smoking status: Never Smoker  . Smokeless tobacco: Never Used  Substance and Sexual Activity  . Alcohol use: Not Currently    Alcohol/week: 2.0 standard drinks    Types: 2 Glasses of wine per week  . Drug use: No  .  Sexual activity: Not Currently  Other Topics Concern  . Not on file  Social History Narrative   Lives with husband   Caffeine use: 1 cup tea per day   Social Determinants of Health   Financial Resource Strain:   . Difficulty of Paying Living Expenses: Not on file  Food Insecurity:   . Worried About Charity fundraiser in the Last Year: Not on file  .  Ran Out of Food in the Last Year: Not on file  Transportation Needs:   . Lack of Transportation (Medical): Not on file  . Lack of Transportation (Non-Medical): Not on file  Physical Activity:   . Days of Exercise per Week: Not on file  . Minutes of Exercise per Session: Not on file  Stress: Stress Concern Present  . Feeling of Stress : To some extent  Social Connections: Unknown  . Frequency of Communication with Friends and Family: Not on file  . Frequency of Social Gatherings with Friends and Family: Not on file  . Attends Religious Services: Not on file  . Active Member of Clubs or Organizations: Yes  . Attends Archivist Meetings: Not on file  . Marital Status: Not on file  Intimate Partner Violence:   . Fear of Current or Ex-Partner: Not on file  . Emotionally Abused: Not on file  . Physically Abused: Not on file  . Sexually Abused: Not on file    Review of Systems:    Constitutional: No weight loss, fever or chills Cardiovascular: No chest pain Respiratory: No SOB Gastrointestinal: See HPI and otherwise negative   Physical Exam:  Vital signs: BP 110/60   Pulse 94   Temp (!) 97.3 F (36.3 C)   Ht 5' 1.75" (1.568 m)   Wt 101 lb (45.8 kg)   BMI 18.62 kg/m   Constitutional:   Pleasant Elderly Caucasian female appears to be in NAD, Well developed, Well nourished, alert and cooperative Respiratory: Respirations even and unlabored. Lungs clear to auscultation bilaterally.   No wheezes, crackles, or rhonchi.  Cardiovascular: Normal S1, S2. No MRG. Regular rate and rhythm. No peripheral edema, cyanosis or  pallor.  Gastrointestinal:  Soft, nondistended, nontender. No rebound or guarding. Normal bowel sounds. No appreciable masses or hepatomegaly. Rectal:  Not performed.  Psychiatric:  Demonstrates good judgement and reason without abnormal affect or behaviors.  MOST RECENT LABS AND IMAGING: CBC    Component Value Date/Time   WBC 10.2 09/21/2018 1514   RBC 4.34 09/21/2018 1514   HGB 13.7 09/21/2018 1514   HGB 13.0 09/25/2016 1017   HCT 39.8 09/21/2018 1514   HCT 37.7 09/25/2016 1017   PLT 349.0 09/21/2018 1514   PLT 411 (H) 09/25/2016 1017   MCV 91.8 09/21/2018 1514   MCV 88 09/25/2016 1017   MCH 30.0 06/08/2018 0544   MCHC 34.4 09/21/2018 1514   RDW 13.2 09/21/2018 1514   RDW 12.8 09/25/2016 1017   LYMPHSABS 1.6 09/21/2018 1514   LYMPHSABS 1.5 09/25/2016 1017   MONOABS 0.9 09/21/2018 1514   EOSABS 0.1 09/21/2018 1514   EOSABS 0.2 09/25/2016 1017   BASOSABS 0.1 09/21/2018 1514   BASOSABS 0.0 09/25/2016 1017    CMP     Component Value Date/Time   NA 138 09/21/2018 1514   K 4.5 09/21/2018 1514   CL 103 09/21/2018 1514   CO2 28 09/21/2018 1514   GLUCOSE 103 (H) 09/21/2018 1514   BUN 25 (H) 09/21/2018 1514   CREATININE 0.71 09/21/2018 1514   CALCIUM 9.0 09/21/2018 1514   PROT 6.9 09/21/2018 1514   ALBUMIN 4.1 09/21/2018 1514   AST 13 09/21/2018 1514   ALT 11 09/21/2018 1514   ALKPHOS 70 09/21/2018 1514   BILITOT 0.4 09/21/2018 1514   GFRNONAA >60 06/08/2018 0544   GFRAA >60 06/08/2018 0544    Assessment: 1.  Constipation: Increased recently, eventually resolved with fiber; likely related to diet/age versus IBS 2.  GERD: Trouble with bronchiectasis recently, also has mucus in her throat 3.  Liver cyst: Subcentimeter cyst seen on recent CT 4.  History of adenomatous polyps and family history of colon cancer: Last colonoscopy in 2015 with recommendations to repeat in 5 years 5.  Chronic anticoagulation for A. fib: On Eliquis  Plan: 1.  Scheduled patient for  surveillance colonoscopy given family history of colon cancer and personal history of adenomatous polyps as well as diagnostic EGD given history of reflux, with [name] in the [facility].  Did discuss risks, benefits, limitations and alternatives and the patient agrees to proceed.  Patient has already been vaccinated for Covid. 2.  Discussed reflux with the patient, recommend that she have an EGD as above to see if she has issues with chronic gastritis/other changes which would make it more pertinent for her to be on a chronic PPI or other medicine. 3.  Discussed constipation, explained that she should continue her fiber and use MiraLAX in addition as needed. 4.  Discussed imaging including recent liver cyst.  This is of no problem for the patient.  It is very very small and does not need any follow-up either. 5.  Patient will hold her Eliquis for 2 days prior to time of procedure.  We will communicate with her prescribing physician to ensure that holding her Eliquis is acceptable for her. 6.  Patient to follow in clinic per recommendations from [name] after time of procedure.  Ellouise Newer, PA-C [facility] 07/14/2019, 9:02 AM  Cc: Cassandria Anger, MD

## 2019-07-16 NOTE — Telephone Encounter (Signed)
Patient informed to hold Eliquis. 

## 2019-07-19 DIAGNOSIS — Z1231 Encounter for screening mammogram for malignant neoplasm of breast: Secondary | ICD-10-CM | POA: Diagnosis not present

## 2019-07-30 DIAGNOSIS — H9201 Otalgia, right ear: Secondary | ICD-10-CM | POA: Diagnosis not present

## 2019-07-30 DIAGNOSIS — H6981 Other specified disorders of Eustachian tube, right ear: Secondary | ICD-10-CM | POA: Diagnosis not present

## 2019-08-03 DIAGNOSIS — H6981 Other specified disorders of Eustachian tube, right ear: Secondary | ICD-10-CM | POA: Diagnosis not present

## 2019-08-16 ENCOUNTER — Other Ambulatory Visit: Payer: Self-pay

## 2019-08-16 ENCOUNTER — Encounter: Payer: Self-pay | Admitting: Gastroenterology

## 2019-08-16 ENCOUNTER — Ambulatory Visit (AMBULATORY_SURGERY_CENTER): Payer: Medicare Other | Admitting: Gastroenterology

## 2019-08-16 VITALS — BP 123/52 | HR 73 | Temp 96.2°F | Resp 17 | Ht 61.0 in | Wt 101.0 lb

## 2019-08-16 DIAGNOSIS — Z86010 Personal history of colonic polyps: Secondary | ICD-10-CM

## 2019-08-16 DIAGNOSIS — Z800 Family history of malignant neoplasm of digestive organs: Secondary | ICD-10-CM

## 2019-08-16 DIAGNOSIS — K219 Gastro-esophageal reflux disease without esophagitis: Secondary | ICD-10-CM | POA: Diagnosis not present

## 2019-08-16 DIAGNOSIS — K449 Diaphragmatic hernia without obstruction or gangrene: Secondary | ICD-10-CM

## 2019-08-16 DIAGNOSIS — K208 Other esophagitis without bleeding: Secondary | ICD-10-CM | POA: Diagnosis not present

## 2019-08-16 DIAGNOSIS — K222 Esophageal obstruction: Secondary | ICD-10-CM | POA: Diagnosis not present

## 2019-08-16 DIAGNOSIS — Z1211 Encounter for screening for malignant neoplasm of colon: Secondary | ICD-10-CM | POA: Diagnosis not present

## 2019-08-16 MED ORDER — SODIUM CHLORIDE 0.9 % IV SOLN: 500.0000 mL | Freq: Once | INTRAVENOUS | Status: DC

## 2019-08-16 MED ORDER — FAMOTIDINE 40 MG PO TABS: 40.0000 mg | ORAL_TABLET | Freq: Two times a day (BID) | ORAL | 11 refills | Status: DC

## 2019-08-16 NOTE — Progress Notes (Signed)
Temp by ADB. VS by DT 

## 2019-08-16 NOTE — Progress Notes (Signed)
A and O x3. Report to RN. Tolerated MAC anesthesia well.Teeth unchanged after procedure.

## 2019-08-16 NOTE — Progress Notes (Signed)
Called to room to assist during endoscopic procedure.  Patient ID and intended procedure confirmed with present staff. Received instructions for my participation in the procedure from the performing physician.  

## 2019-08-16 NOTE — Op Note (Signed)
[facility] Patient Name: Monica [name] Procedure Date: 08/16/2019 1:32 PM MRN: [id number] Endoscopist: Ladene Artist , MD Age: 74 Referring MD:  Date of Birth: 05-27-1945 Gender: Female Account #: [id number] Procedure:                Upper GI endoscopy Indications:              Gastroesophageal reflux disease Medicines:                Monitored Anesthesia Care Procedure:                Pre-Anesthesia Assessment:                           - Prior to the procedure, a History and Physical                            was performed, and patient medications and                            allergies were reviewed. The patient's tolerance of                            previous anesthesia was also reviewed. The risks                            and benefits of the procedure and the sedation                            options and risks were discussed with the patient.                            All questions were answered, and informed consent                            was obtained. Prior Anticoagulants: The patient has                            taken Eliquis (apixaban), last dose was 2 days                            prior to procedure. ASA Grade Assessment: III - A                            patient with severe systemic disease. After                            reviewing the risks and benefits, the patient was                            deemed in satisfactory condition to undergo the                            procedure.  After obtaining informed consent, the endoscope was                            passed under direct vision. Throughout the                            procedure, the patient's blood pressure, pulse, and                            oxygen saturations were monitored continuously. The                            Endoscope was introduced through the mouth, and                            advanced to the second part of duodenum. The upper                     GI endoscopy was accomplished without difficulty.                            The patient tolerated the procedure well. Scope In: Scope Out: Findings:                 LA Grade B (one or more mucosal breaks greater than                            5 mm, not extending between the tops of two mucosal                            folds) esophagitis with no bleeding was found at                            the gastroesophageal junction.                           The Z-line was variable and was found 35 cm from                            the incisors. Biopsies were taken with a cold                            forceps for histology.                           One benign-appearing, intrinsic mild stenosis was                            found 35 cm from the incisors. This stenosis                            measured 1.5 cm (inner diameter) x less than one cm                            (  in length). The stenosis was traversed.                           The exam of the esophagus was otherwise normal.                           A small hiatal hernia was present.                           The exam of the stomach was otherwise normal.                           The duodenal bulb and second portion of the                            duodenum were normal. Complications:            No immediate complications. Estimated Blood Loss:     Estimated blood loss was minimal. Impression:               - LA Grade B reflux esophagitis with no bleeding.                           - Z-line variable, 35 cm from the incisors.                            Biopsied.                           - Benign-appearing esophageal stenosis.                           - Small hiatal hernia.                           - Normal duodenal bulb and second portion of the                            duodenum. Recommendation:           - Patient has a contact number available for                            emergencies. The signs and  symptoms of potential                            delayed complications were discussed with the                            patient. Return to normal activities tomorrow.                            Written discharge instructions were provided to the                            patient.                           -  Resume previous diet.                           - Follow antireflux measures.                           - Continue present medications.                           - Resume Eliquis (apixaban) at prior dose in 2                            days. Refer to managing physician for further                            adjustment of therapy.                           - Await pathology results.                           - Pt prefers to avoid or limit use of PPIs.                           - Famotidine 40 mg PO bid, 1 year of refills.                           - Return to GI office in 6 weeks. Ladene Artist, MD 08/16/2019 2:17:06 PM This report has been signed electronically.

## 2019-08-16 NOTE — Patient Instructions (Signed)
Handouts given for hiatal hernia, esophagitis and diverticulosis.  Resume your Eliquis at prior dose in 2 days.  Await pathology results.  Pick up new prescription today.  YOU HAD AN ENDOSCOPIC PROCEDURE TODAY AT [facility]:   Refer to the procedure report that was given to you for any specific questions about what was found during the examination.  If the procedure report does not answer your questions, please call your gastroenterologist to clarify.  If you requested that your care partner not be given the details of your procedure findings, then the procedure report has been included in a sealed envelope for you to review at your convenience later.  YOU SHOULD EXPECT: Some feelings of bloating in the abdomen. Passage of more gas than usual.  Walking can help get rid of the air that was put into your GI tract during the procedure and reduce the bloating. If you had a lower endoscopy (such as a colonoscopy or flexible sigmoidoscopy) you may notice spotting of blood in your stool or on the toilet paper. If you underwent a bowel prep for your procedure, you may not have a normal bowel movement for a few days.  Please Note:  You might notice some irritation and congestion in your nose or some drainage.  This is from the oxygen used during your procedure.  There is no need for concern and it should clear up in a day or so.  SYMPTOMS TO REPORT IMMEDIATELY:   Following lower endoscopy (colonoscopy or flexible sigmoidoscopy):  Excessive amounts of blood in the stool  Significant tenderness or worsening of abdominal pains  Swelling of the abdomen that is new, acute  Fever of 100F or higher   Following upper endoscopy (EGD)  Vomiting of blood or coffee ground material  New chest pain or pain under the shoulder blades  Painful or persistently difficult swallowing  New shortness of breath  Fever of 100F or higher  Black, tarry-looking stools  For urgent or emergent issues, a  gastroenterologist can be reached at any hour by calling [phone]. Do not use MyChart messaging for urgent concerns.    DIET:  We do recommend a small meal at first, but then you may proceed to your regular diet.  Drink plenty of fluids but you should avoid alcoholic beverages for 24 hours.  ACTIVITY:  You should plan to take it easy for the rest of today and you should NOT DRIVE or use heavy machinery until tomorrow (because of the sedation medicines used during the test).    FOLLOW UP: Our staff will call the number listed on your records 48-72 hours following your procedure to check on you and address any questions or concerns that you may have regarding the information given to you following your procedure. If we do not reach you, we will leave a message.  We will attempt to reach you two times.  During this call, we will ask if you have developed any symptoms of COVID 19. If you develop any symptoms (ie: fever, flu-like symptoms, shortness of breath, cough etc.) before then, please call [phone].  If you test positive for Covid 19 in the 2 weeks post procedure, please call and report this information to [place].    If any biopsies were taken you will be contacted by phone or by letter within the next 1-3 weeks.  Please call us at [phone] if you have not heard about the biopsies in 3 weeks.    SIGNATURES/CONFIDENTIALITY: You  and/or your care partner have signed paperwork which will be entered into your electronic medical record.  These signatures attest to the fact that that the information above on your After Visit Summary has been reviewed and is understood.  Full responsibility of the confidentiality of this discharge information lies with you and/or your care-partner.

## 2019-08-16 NOTE — Op Note (Signed)
[facility] Patient Name: Monica [name] Procedure Date: 08/16/2019 1:32 PM MRN: [id number] Endoscopist: Ladene Artist , MD Age: 74 Referring MD:  Date of Birth: January 06, 1946 Gender: Female Account #: [id number] Procedure:                Colonoscopy Indications:              Surveillance: Personal history of adenomatous                            polyps on last colonoscopy > 5 years ago. Family                            history of colon cancer, first degree relative. Medicines:                Monitored Anesthesia Care Procedure:                Pre-Anesthesia Assessment:                           - Prior to the procedure, a History and Physical                            was performed, and patient medications and                            allergies were reviewed. The patient's tolerance of                            previous anesthesia was also reviewed. The risks                            and benefits of the procedure and the sedation                            options and risks were discussed with the patient.                            All questions were answered, and informed consent                            was obtained. Prior Anticoagulants: The patient has                            taken Eliquis (apixaban), last dose was 2 days                            prior to procedure. ASA Grade Assessment: III - A                            patient with severe systemic disease. After                            reviewing the risks and benefits, the patient was  deemed in satisfactory condition to undergo the                            procedure.                           After obtaining informed consent, the colonoscope                            was passed under direct vision. Throughout the                            procedure, the patient's blood pressure, pulse, and                            oxygen saturations were monitored continuously. The                            Colonoscope was introduced through the anus and                            advanced to the the terminal ileum, with                            identification of the appendiceal orifice and IC                            valve. The terminal ileum, ileocecal valve,                            appendiceal orifice, and rectum were photographed.                            The quality of the bowel preparation was excellent.                            The patient tolerated the procedure well. The                            colonoscopy was somewhat difficult due to                            significant looping and a tortuous colon. Scope In: 1:44:16 PM Scope Out: 1:56:40 PM Scope Withdrawal Time: 0 hours 7 minutes 2 seconds  Total Procedure Duration: 0 hours 12 minutes 24 seconds  Findings:                 The perianal and digital rectal examinations were                            normal.                           The terminal ileum appeared normal.  Multiple medium-mouthed diverticula were found in                            the left colon. There was no evidence of                            diverticular bleeding.                           The exam was otherwise without abnormality on                            direct and retroflexion views. Complications:            No immediate complications. Estimated blood loss:                            None. Estimated Blood Loss:     Estimated blood loss: none. Impression:               - Mild diverticulosis in the left colon.                           - Normal terminal ileum.                           - The examination was otherwise normal on direct                            and retroflexion views.                           - No specimens collected. Recommendation:           - Resume Eliquis (apixaban) in 2 days at prior                            dose. Refer to managing physician for further                             adjustment of therapy.                           - Patient has a contact number available for                            emergencies. The signs and symptoms of potential                            delayed complications were discussed with the                            patient. Return to normal activities tomorrow.                            Written discharge instructions were provided to the  patient.                           - Resume previous diet.                           - Continue present medications.                           - No repeat colonoscopy due to age and the absence                            of colonic polyps. Ladene Artist, MD 08/16/2019 2:11:02 PM This report has been signed electronically.

## 2019-08-18 ENCOUNTER — Telehealth: Payer: Self-pay | Admitting: *Deleted

## 2019-08-18 NOTE — Telephone Encounter (Signed)
  Follow up Call-  Call back number 08/16/2019  Post procedure Call Back phone  # [phone]  Permission to leave phone message Yes  Some recent data might be hidden     Patient questions:  Do you have a fever, pain , or abdominal swelling? No. Pain Score  0 *  Have you tolerated food without any problems? Yes.    Have you been able to return to your normal activities? Yes.    Do you have any questions about your discharge instructions: Diet   No. Medications  No. Follow up visit  No.  Do you have questions or concerns about your Care? No.  Actions: * If pain score is 4 or above: No action needed, pain <4.  1. Have you developed a fever since your procedure? no  2.   Have you had an respiratory symptoms (SOB or cough) since your procedure? no  3.   Have you tested positive for COVID 19 since your procedure no  4.   Have you had any family members/close contacts diagnosed with the COVID 19 since your procedure?  no   If yes to any of these questions please route to Joylene John, RN and Erenest Rasher, RN

## 2019-08-31 ENCOUNTER — Encounter: Payer: Self-pay | Admitting: Gastroenterology

## 2019-09-22 ENCOUNTER — Encounter: Payer: Self-pay | Admitting: Gastroenterology

## 2019-09-22 ENCOUNTER — Ambulatory Visit (INDEPENDENT_AMBULATORY_CARE_PROVIDER_SITE_OTHER): Payer: Medicare Other | Admitting: Gastroenterology

## 2019-09-22 VITALS — BP 100/60 | HR 80 | Ht 61.0 in | Wt 101.6 lb

## 2019-09-22 DIAGNOSIS — K2100 Gastro-esophageal reflux disease with esophagitis, without bleeding: Secondary | ICD-10-CM | POA: Diagnosis not present

## 2019-09-22 MED ORDER — PANTOPRAZOLE SODIUM 40 MG PO TBEC
40.0000 mg | DELAYED_RELEASE_TABLET | Freq: Every day | ORAL | 2 refills | Status: DC
Start: 2019-09-22 — End: 2020-03-08

## 2019-09-22 NOTE — Patient Instructions (Signed)
We have sent the following medications to your pharmacy for you to pick up at your convenience: pantoprazole.   You can use famotidine 40 mg at bedtime as needed.   Call back in 3 months with a symptom update.   Thank you for choosing me and [facility].  Pricilla Riffle. Dagoberto Ligas., MD., Marval Regal

## 2019-09-22 NOTE — Progress Notes (Signed)
    History of Present Illness: This is a 74 year old female with GERD and erosive esophagitis.  She relates ongoing problems with cough, mucous in throat.  She feels seasonal allergies and other factors may be contributing to her cough.  A 84-month trial of Dexilant 60 mg daily did not control all her symptoms.  She is not sure that famotidine twice daily is working as well as a PPI.  She states she was on pantoprazole years ago and felt that was more effective than famotidine or Dexilant.  She would like to try pantoprazole again.  EGD 08/2019 - LA Grade B reflux esophagitis with no bleeding. - Z-line variable, 35 cm from the incisors. Biopsied. - Benign-appearing esophageal stenosis. - Small hiatal hernia. - Normal duodenal bulb and second portion of the duodenum.  Current Medications, Allergies, Past Medical History, Past Surgical History, Family History and Social History were reviewed in [employer] record.   Physical Exam: General: Well developed, well nourished, thin, no acute distress Head: Normocephalic and atraumatic Eyes:  sclerae anicteric, EOMI Ears: Normal auditory acuity Mouth: Not examined, mask on during Covid-19 pandemic Lungs: Clear throughout to auscultation Heart: Regular rate and rhythm; no murmurs, rubs or bruits Abdomen: Soft, non tender and non distended. No masses, hepatosplenomegaly or hernias noted. Normal Bowel sounds Rectal: Not done Musculoskeletal: Symmetrical with no gross deformities  Pulses:  Normal pulses noted Extremities: No clubbing, cyanosis, edema or deformities noted Neurological: Alert oriented x 4, grossly nonfocal Psychological:  Alert and cooperative. Normal mood and affect   Assessment and Recommendations:  1.  GERD with LA grade B esophagitis, nonobstructing esophageal stricture.  Follow antireflux measures including head of bed elevation.  Discontinue famotidine.  Begin pantoprazole 40 mg every morning.  If  symptoms not under better control within 3 to 4 weeks add famotidine 40 mg every evening.  After 3 months of pantoprazole she is advised to contact the office to reassess management plan. REV in 1 year.

## 2019-10-07 ENCOUNTER — Ambulatory Visit: Payer: Medicare Other

## 2019-10-22 DIAGNOSIS — H2513 Age-related nuclear cataract, bilateral: Secondary | ICD-10-CM | POA: Diagnosis not present

## 2019-10-22 DIAGNOSIS — H524 Presbyopia: Secondary | ICD-10-CM | POA: Diagnosis not present

## 2019-11-24 ENCOUNTER — Ambulatory Visit: Payer: Medicare Other | Attending: Internal Medicine

## 2019-11-24 DIAGNOSIS — Z20822 Contact with and (suspected) exposure to covid-19: Secondary | ICD-10-CM | POA: Diagnosis not present

## 2019-11-25 LAB — SARS-COV-2, NAA 2 DAY TAT

## 2019-11-25 LAB — NOVEL CORONAVIRUS, NAA: SARS-CoV-2, NAA: NOT DETECTED

## 2019-11-29 NOTE — Telephone Encounter (Signed)
Sarah please advise on patients my chart message  Covid test negatives be on This past Thursday morning. Still with symptoms--some chest discomfort, no fever, nausea, headache, fatigue. Since I have bronchiectasis and history of pneumonia, I worry about chest problems first. I seem to Feel better in the evening but worse the next morning & daytime. Please advise

## 2019-11-30 ENCOUNTER — Ambulatory Visit (INDEPENDENT_AMBULATORY_CARE_PROVIDER_SITE_OTHER): Payer: Medicare Other | Admitting: Internal Medicine

## 2019-11-30 ENCOUNTER — Encounter: Payer: Self-pay | Admitting: Internal Medicine

## 2019-11-30 ENCOUNTER — Other Ambulatory Visit: Payer: Self-pay

## 2019-11-30 VITALS — BP 114/72 | HR 95 | Temp 98.2°F | Ht 61.0 in | Wt 101.0 lb

## 2019-11-30 DIAGNOSIS — R399 Unspecified symptoms and signs involving the genitourinary system: Secondary | ICD-10-CM | POA: Diagnosis not present

## 2019-11-30 DIAGNOSIS — R300 Dysuria: Secondary | ICD-10-CM

## 2019-11-30 LAB — POCT URINALYSIS DIPSTICK
Bilirubin, UA: NEGATIVE
Blood, UA: NEGATIVE
Glucose, UA: NEGATIVE
Nitrite, UA: NEGATIVE
Protein, UA: NEGATIVE
Spec Grav, UA: 1.02 (ref 1.010–1.025)
Urobilinogen, UA: 0.2 E.U./dL
pH, UA: 5.5 (ref 5.0–8.0)

## 2019-11-30 NOTE — Patient Instructions (Addendum)
We will send the urine for a culture and let you know about the results.

## 2019-11-30 NOTE — Progress Notes (Signed)
   Subjective:   Patient ID: Monica [name], female    DOB: 1945/05/31, 74 y.o.   MRN: [id number]  HPI The patient is a 74 YO female coming in for concerns about possible UTI. She is having some burning low abdomen and intermittent pain with urinating. She denies fevers or chills. She has had an URI recently (negative covid-19 testing) and wanted to make sure her lungs were clear. Overall this is improving. Denies vaginal pain or discharge. Denies nausea or vomiting. Overall symptoms started with her bladder 1-2 weeks ago. Overall intermittent and some days not present others a little worse.   Review of Systems  Constitutional: Negative.   Respiratory: Negative.   Cardiovascular: Negative.   Gastrointestinal: Positive for abdominal pain. Negative for abdominal distention, constipation, diarrhea, nausea and vomiting.  Genitourinary: Positive for dysuria and urgency. Negative for frequency.  Musculoskeletal: Negative.   Skin: Negative.     Objective:  Physical Exam Constitutional:      Appearance: She is well-developed.  HENT:     Head: Normocephalic and atraumatic.  Cardiovascular:     Rate and Rhythm: Normal rate and regular rhythm.  Pulmonary:     Effort: Pulmonary effort is normal. No respiratory distress.     Breath sounds: Normal breath sounds. No wheezing or rales.  Abdominal:     General: Bowel sounds are normal. There is no distension.     Palpations: Abdomen is soft.     Tenderness: There is no abdominal tenderness. There is no rebound.  Musculoskeletal:     Cervical back: Normal range of motion.  Skin:    General: Skin is warm and dry.  Neurological:     Mental Status: She is alert and oriented to person, place, and time.     Coordination: Coordination normal.     Vitals:   11/30/19 1456  BP: 114/72  Pulse: 95  Temp: 98.2 F (36.8 C)  TempSrc: Oral  SpO2: 98%  Weight: 101 lb (45.8 kg)  Height: 5\' 1"  (1.549 m)    This visit occurred during the SARS-CoV-2  public health emergency.  Safety protocols were in place, including screening questions prior to the visit, additional usage of staff PPE, and extensive cleaning of exam room while observing appropriate contact time as indicated for disinfecting solutions.   Assessment & Plan:

## 2019-12-01 LAB — URINE CULTURE

## 2019-12-02 NOTE — Assessment & Plan Note (Signed)
U/A done without clear signs of infection. Sent for culture and will await results prior to any antibiotics given her stomach sensitivities with many antibiotics.

## 2019-12-02 NOTE — Telephone Encounter (Signed)
Called and spoke with patient she was able to get in with her PCP and states she had some other issues going on that she needed to see PCP about so she is feeling better at this time. Told patient to let us know if she needs anything else. Nothing further needed at this time

## 2019-12-09 ENCOUNTER — Telehealth (INDEPENDENT_AMBULATORY_CARE_PROVIDER_SITE_OTHER): Payer: Medicare Other | Admitting: Internal Medicine

## 2019-12-09 ENCOUNTER — Other Ambulatory Visit: Payer: Self-pay | Admitting: Cardiovascular Disease

## 2019-12-09 ENCOUNTER — Other Ambulatory Visit: Payer: Self-pay

## 2019-12-09 DIAGNOSIS — J301 Allergic rhinitis due to pollen: Secondary | ICD-10-CM

## 2019-12-09 MED ORDER — METHYLPREDNISOLONE 4 MG PO TBPK: ORAL_TABLET | ORAL | 0 refills | Status: DC

## 2019-12-09 MED ORDER — MONTELUKAST SODIUM 10 MG PO TABS
10.0000 mg | ORAL_TABLET | Freq: Every day | ORAL | 11 refills | Status: DC
Start: 2019-12-09 — End: 2020-03-08

## 2019-12-09 NOTE — Progress Notes (Signed)
Virtual Visit via Video Note  I connected with [name] on 12/09/19 at 10:20 AM EDT by a video enabled telemedicine application and verified that I am speaking with the correct person using two identifiers.   I discussed the limitations of evaluation and management by telemedicine and the availability of in person appointments. The patient expressed understanding and agreed to proceed.  I was located at our [facility] office. The patient was at home. There was no one else present in the visit.   History of Present Illness: C/o congestion  There has been no runny nose, cough, chest pain, shortness of breath, abdominal pain, diarrhea, constipation, arthralgias, skin rashes.   Observations/Objective: The patient appears to be in no acute distress, looks well.  Assessment and Plan:  See my Assessment and Plan. Follow Up Instructions:    I discussed the assessment and treatment plan with the patient. The patient was provided an opportunity to ask questions and all were answered. The patient agreed with the plan and demonstrated an understanding of the instructions.   The patient was advised to call back or seek an in-person evaluation if the symptoms worsen or if the condition fails to improve as anticipated.  I provided face-to-face time during this encounter. We were at different locations.   Walker Kehr, MD

## 2019-12-12 NOTE — Assessment & Plan Note (Signed)
Worse.  Try Singulair 10 mg daily.  If not better Medrol Dosepak.

## 2019-12-24 ENCOUNTER — Encounter: Payer: Self-pay | Admitting: Adult Health

## 2019-12-24 ENCOUNTER — Ambulatory Visit (INDEPENDENT_AMBULATORY_CARE_PROVIDER_SITE_OTHER): Payer: Medicare Other

## 2019-12-24 ENCOUNTER — Other Ambulatory Visit: Payer: Self-pay

## 2019-12-24 ENCOUNTER — Ambulatory Visit (INDEPENDENT_AMBULATORY_CARE_PROVIDER_SITE_OTHER): Payer: Medicare Other | Admitting: Adult Health

## 2019-12-24 VITALS — BP 112/60 | HR 92 | Temp 97.3°F | Ht 61.5 in | Wt 100.8 lb

## 2019-12-24 DIAGNOSIS — J479 Bronchiectasis, uncomplicated: Secondary | ICD-10-CM | POA: Diagnosis not present

## 2019-12-24 DIAGNOSIS — J301 Allergic rhinitis due to pollen: Secondary | ICD-10-CM

## 2019-12-24 DIAGNOSIS — J189 Pneumonia, unspecified organism: Secondary | ICD-10-CM | POA: Diagnosis not present

## 2019-12-24 DIAGNOSIS — J471 Bronchiectasis with (acute) exacerbation: Secondary | ICD-10-CM | POA: Diagnosis not present

## 2019-12-24 LAB — CBC WITH DIFFERENTIAL/PLATELET
Basophils Absolute: 0.1 10*3/uL (ref 0.0–0.1)
Basophils Relative: 0.7 % (ref 0.0–3.0)
Eosinophils Absolute: 0.1 10*3/uL (ref 0.0–0.7)
Eosinophils Relative: 1 % (ref 0.0–5.0)
HCT: 41.3 % (ref 36.0–46.0)
Hemoglobin: 13.9 g/dL (ref 12.0–15.0)
Lymphocytes Relative: 13.5 % (ref 12.0–46.0)
Lymphs Abs: 1.4 10*3/uL (ref 0.7–4.0)
MCHC: 33.6 g/dL (ref 30.0–36.0)
MCV: 93.1 fl (ref 78.0–100.0)
Monocytes Absolute: 1 10*3/uL (ref 0.1–1.0)
Monocytes Relative: 10.1 % (ref 3.0–12.0)
Neutro Abs: 7.5 10*3/uL (ref 1.4–7.7)
Neutrophils Relative %: 74.7 % (ref 43.0–77.0)
Platelets: 381 10*3/uL (ref 150.0–400.0)
RBC: 4.44 Mil/uL (ref 3.87–5.11)
RDW: 12.8 % (ref 11.5–15.5)
WBC: 10 10*3/uL (ref 4.0–10.5)

## 2019-12-24 LAB — BASIC METABOLIC PANEL
BUN: 20 mg/dL (ref 6–23)
CO2: 31 mEq/L (ref 19–32)
Calcium: 9.5 mg/dL (ref 8.4–10.5)
Chloride: 100 mEq/L (ref 96–112)
Creatinine, Ser: 0.75 mg/dL (ref 0.40–1.20)
GFR: 75.38 mL/min (ref >60.00)
Glucose, Bld: 88 mg/dL (ref 70–99)
Potassium: 4 mEq/L (ref 3.5–5.1)
Sodium: 137 mEq/L (ref 135–145)

## 2019-12-24 NOTE — Addendum Note (Signed)
Addended by: Suzzanne Cloud E on: 12/24/2019 10:54 AM   Modules accepted: Orders

## 2019-12-24 NOTE — Assessment & Plan Note (Addendum)
Bronchiectasis appears stable.  Patient with no increased cough congestion.  No weight loss.  We will check chest x-ray today.  She is continue with aggressive pulmonary hygiene regimen with flutter valve.  Previously on hypertonic nebs but did not feel they were clinically beneficial. Check labs today.  Plan  Patient Instructions  Chest xray today  Labs today .  Saline nasal rinses and gel As needed   Increase Flutter valve 3 times a day .  Mucinex Twice daily  .As needed  Congestion  Push Fluids and rest  Healthy diet .  Continue on Flonase and Claritin. Follow up with Dr. Lamonte Sakai  In 6-8  weeks as planned  and as needed Please contact office for sooner follow up if symptoms do not improve or worsen or seek emergency care

## 2019-12-24 NOTE — Patient Instructions (Signed)
Chest xray today  Labs today .  Saline nasal rinses and gel As needed   Increase Flutter valve 3 times a day .  Mucinex Twice daily  .As needed  Congestion  Push Fluids and rest  Healthy diet .  Continue on Flonase and Claritin. Follow up with Dr. Lamonte Sakai  In 6-8  weeks as planned  and as needed Please contact office for sooner follow up if symptoms do not improve or worsen or seek emergency care

## 2019-12-24 NOTE — Addendum Note (Signed)
Addended by: Merrilee Seashore on: 12/24/2019 10:49 AM   Modules accepted: Orders

## 2019-12-24 NOTE — Progress Notes (Signed)
@Patient  ID: Monica [name], female    DOB: 1946/03/08, 74 y.o.   MRN: [id number]  Chief Complaint  Patient presents with  . Follow-up    bronchiectassis     Referring provider: Cassandria Anger, MD  HPI: 74 year old female followed for bronchiectasis with chronic cough complicated by GERD and allergic rhinitis No evidence of airflow obstruction on methacholine challenge test 2018 Medical history significant for A. fib on Eliquis.  She has multiple antibiotic allergies and intolerances  TEST/EVENTS :   12/24/2019 Follow up: Bronchiectasis  Patient presents for a follow-up visit.  She was last seen November 2020.  She says overall breathing has been doing the same.  She has intermittent cough and congestion.  Uses her flutter valve 2-3 times a day.  She says over the last month she has had some increased postnasal drainage sinus congestion no discolored mucus.  No increased cough or congestion.  No fever no weight loss or hemoptysis.  She was seen by her primary care provider through a virtual visit.  Covid testing was negative.  She was given a steroid taper for flare of her chronic rhinitis.. She says over the last few months she has not felt as well with some low energy.   Allergies  Allergen Reactions  . Albuterol Other (See Comments)    Patient states put her into atrial fib last time she took this medication  . Macrobid [Nitrofurantoin Macrocrystal]     Diarrhea, nausea  . Amoxicillin-Pot Clavulanate Diarrhea    Has patient had a PCN reaction causing immediate rash, facial/tongue/throat swelling, SOB or lightheadedness with hypotension:No Has patient had a PCN reaction causing severe rash involving mucus membranes or skin necrosis:No Has patient had a PCN reaction that required hospitalization:No Has patient had a PCN reaction occurring within the last 10 years:Yes If all of the above answers are "NO", then may proceed with Cephalosporin use  . Avelox [Moxifloxacin Hcl  In Nacl] Nausea Only  . Bactrim [Sulfamethoxazole-Trimethoprim] Other (See Comments)    flushing  . Benzonatate Other (See Comments)    felt bad, out of sorts, disoriented.  . Ciprofloxacin Rash    Rash across abdomen    Immunization History  Administered Date(s) Administered  . Fluad Quad(high Dose 65+) 12/29/2018  . Influenza Split 02/25/2011, 02/05/2012  . Influenza Whole 03/02/2008, 03/02/2009, 02/06/2010  . Influenza, High Dose Seasonal PF 01/28/2017  . Influenza,inj,Quad PF,6+ Mos 02/04/2013, 01/24/2014, 02/02/2015, 02/01/2016, 01/22/2018  . Influenza-Unspecified 01/04/2017  . PFIZER SARS-COV-2 Vaccination 05/27/2019, 06/17/2019  . Pneumococcal Conjugate-13 01/04/2013, 02/16/2013  . Pneumococcal Polysaccharide-23 03/11/2003, 01/16/2009, 09/26/2014  . Td 06/06/2001  . Tdap 07/17/2012  . Zoster 05/07/2007  . Zoster Recombinat (Shingrix) 05/07/2007, 12/09/2017, 05/28/2018    Past Medical History:  Diagnosis Date  . Adenomatous colon polyp 10/2003  . Allergic rhinitis   . Allergy    SEASONAL  . Bronchiectasis   . Chronic sinusitis   . Diverticulosis   . GERD (gastroesophageal reflux disease)   . Hearing loss   . Hemorrhoids   . Insomnia   . Menopausal disorder   . Osteoporosis   . Palpitations   . Paroxysmal A-fib ([place])   . TMJ syndrome   . Urticaria     Tobacco History: Social History   Tobacco Use  Smoking Status Never Smoker  Smokeless Tobacco Never Used   Counseling given: Not Answered   Outpatient Medications Prior to Visit  Medication Sig Dispense Refill  . acetaminophen (TYLENOL) 500 MG tablet Take 500  mg by mouth every 6 (six) hours as needed for moderate pain or headache.    . calcium carbonate (CALCIUM 600) 1500 (600 Ca) MG TABS tablet Take 600 mg of elemental calcium by mouth 2 (two) times daily with a meal.    . calcium carbonate (TUMS EX) 750 MG chewable tablet Chew 1 tablet by mouth daily.    . Cholecalciferol (VITAMIN D) 1000 UNITS capsule  Take 1,000 Units by mouth daily.      Marland Kitchen dextromethorphan (DELSYM) 30 MG/5ML liquid Take 15-30 mLs 2 (two) times daily as needed by mouth for cough.    Marland Kitchen ELIQUIS 5 MG TABS tablet TAKE 1 TABLET TWICE A DAY 180 tablet 3  . Estradiol 10 MCG TABS Place 10 mcg 2 (two) times a week vaginally. Pt does not have set days    . famotidine (PEPCID) 40 MG tablet Take 1 tablet (40 mg total) by mouth 2 (two) times daily. 60 tablet 11  . fluticasone (FLONASE) 50 MCG/ACT nasal spray Place 2 sprays into both nostrils at bedtime as needed for allergies or rhinitis.    Marland Kitchen guaiFENesin (MUCINEX) 600 MG 12 hr tablet Take 600 mg by mouth 2 (two) times daily as needed for cough or to loosen phlegm.     . Lactobacillus Rhamnosus, GG, (CULTURELLE PO) Take 1 tablet by mouth daily.    Marland Kitchen loratadine (CLARITIN) 10 MG tablet Take 10 mg daily as needed by mouth for allergies.     . metoprolol tartrate (LOPRESSOR) 25 MG tablet Take 0.5 tablets (12.5 mg total) by mouth as needed. 15 tablet 1  . montelukast (SINGULAIR) 10 MG tablet Take 1 tablet (10 mg total) by mouth daily. 30 tablet 11  . Multiple Vitamins-Minerals (CENTRUM SILVER PO) Take 1 tablet by mouth daily.     Marland Kitchen Respiratory Therapy Supplies (FLUTTER) DEVI Twice a day and prn as needed, may increase if feeling worse 1 each 0  . methylPREDNISolone (MEDROL DOSEPAK) 4 MG TBPK tablet As directed 21 tablet 0  . pantoprazole (PROTONIX) 40 MG tablet Take 1 tablet (40 mg total) by mouth daily. 30 tablet 2   No facility-administered medications prior to visit.     Review of Systems:   Constitutional:   No  weight loss, night sweats,  Fevers, chills,  +fatigue, or  lassitude.  HEENT:   No headaches,  Difficulty swallowing,  Tooth/dental problems, or  Sore throat,                No sneezing, itching, ear ache,  +nasal congestion, post nasal drip,   CV:  No chest pain,  Orthopnea, PND, swelling in lower extremities, anasarca, dizziness, palpitations, syncope.   GI  No  heartburn, indigestion, abdominal pain, nausea, vomiting, diarrhea, change in bowel habits, loss of appetite, bloody stools.   Resp:  .  No chest wall deformity  Skin: no rash or lesions.  GU: no dysuria, change in color of urine, no urgency or frequency.  No flank pain, no hematuria   MS:  No joint pain or swelling.  No decreased range of motion.  No back pain.    Physical Exam  BP 112/60 (BP Location: Left Arm, Cuff Size: Normal)   Pulse 92   Temp (!) 97.3 F (36.3 C) (Other (Comment)) Comment (Src): wrist  Ht 5' 1.5" (1.562 m)   Wt 100 lb 12.8 oz (45.7 kg)   SpO2 99% Comment: Room air  BMI 18.74 kg/m   GEN: A/Ox3; pleasant , NAD, thin  female    HEENT:  [place]/AT,    NOSE-clear, THROAT-clear, no lesions, no postnasal drip or exudate noted.   NECK:  Supple w/ fair ROM; no JVD; normal carotid impulses w/o bruits; no thyromegaly or nodules palpated; no lymphadenopathy.    RESP  Clear  P & A; w/o, wheezes/ rales/ or rhonchi. no accessory muscle use, no dullness to percussion  CARD:  RRR, no m/r/g, no peripheral edema, pulses intact, no cyanosis or clubbing.  GI:   Soft & nt; nml bowel sounds; no organomegaly or masses detected.   Musco: Warm bil, no deformities or joint swelling noted.   Neuro: alert, no focal deficits noted.    Skin: Warm, no lesions or rashes    Lab Results:  CBC  BMET  BNP No results found for: BNP  ProBNP No results found for: PROBNP  Imaging: No results found.    PFT Results Latest Ref Rng & Units 09/13/2016 12/11/2015  FVC-Pre L 2.43 2.53  FVC-Predicted Pre % 89 92  FVC-Post L 2.53 2.49  FVC-Predicted Post % 93 91  Pre FEV1/FVC % % 82 79  Post FEV1/FCV % % 78 81  FEV1-Pre L 1.99 2.00  FEV1-Predicted Pre % 97 97  FEV1-Post L 1.98 2.01  DLCO uncorrected ml/min/mmHg - 17.03  DLCO UNC% % - 78  DLCO corrected ml/min/mmHg - 16.92  DLCO COR %Predicted % - 78  DLVA Predicted % - 90    No results found for:  NITRICOXIDE      Assessment & Plan:   Bronchiectasis without acute exacerbation (HCC) Bronchiectasis appears stable.  Patient with no increased cough congestion.  No weight loss.  We will check chest x-ray today.  She is continue with aggressive pulmonary hygiene regimen with flutter valve.  Previously on hypertonic nebs but did not feel they were clinically beneficial. Check labs today.  Plan  Patient Instructions  Chest xray today  Labs today .  Saline nasal rinses and gel As needed   Increase Flutter valve 3 times a day .  Mucinex Twice daily  .As needed  Congestion  Push Fluids and rest  Healthy diet .  Continue on Flonase and Claritin. Follow up with Dr. Lamonte Sakai  In 6-8  weeks as planned  and as needed Please contact office for sooner follow up if symptoms do not improve or worsen or seek emergency care        ALLERGIC RHINITIS, SEASONAL Continue on preventive regimen. Recent flare with slight improvement on steroids but continues to have ongoing symptoms.  No signs of active or acute infection on exam.     Rexene Edison, NP 12/24/2019

## 2019-12-24 NOTE — Assessment & Plan Note (Signed)
Continue on preventive regimen. Recent flare with slight improvement on steroids but continues to have ongoing symptoms.  No signs of active or acute infection on exam.

## 2019-12-27 NOTE — Progress Notes (Signed)
Called and spoke with patient about xray results per Rexene Edison NP. All questions answered and patient expressed full understanding. Nothing further needed at this time.

## 2020-01-11 ENCOUNTER — Encounter: Payer: Self-pay | Admitting: Cardiovascular Disease

## 2020-01-11 ENCOUNTER — Other Ambulatory Visit: Payer: Self-pay

## 2020-01-11 ENCOUNTER — Ambulatory Visit (INDEPENDENT_AMBULATORY_CARE_PROVIDER_SITE_OTHER): Payer: Medicare Other | Admitting: Cardiovascular Disease

## 2020-01-11 VITALS — BP 97/61 | HR 99 | Ht 61.5 in | Wt 103.4 lb

## 2020-01-11 DIAGNOSIS — Z7901 Long term (current) use of anticoagulants: Secondary | ICD-10-CM

## 2020-01-11 DIAGNOSIS — I480 Paroxysmal atrial fibrillation: Secondary | ICD-10-CM | POA: Diagnosis not present

## 2020-01-11 MED ORDER — METOPROLOL TARTRATE 25 MG PO TABS
12.5000 mg | ORAL_TABLET | ORAL | 1 refills | Status: DC | PRN
Start: 2020-01-11 — End: 2021-05-24

## 2020-01-11 NOTE — Patient Instructions (Signed)

## 2020-01-11 NOTE — Progress Notes (Signed)
Cardiology office note   Date:  01/11/2020   ID:  Monica [name], DOB 09-03-1945, MRN [id number]   PCP:  Cassandria Anger, MD  Cardiologist:  Sanda Klein, MD  Electrophysiologist:  None   Evaluation Performed:  Follow-Up Visit  Chief Complaint: Atrial fibrillation  History of Present Illness:    Monica [name] is a 74 y.o. female with palpitations related to infrequent episodes of paroxysmal atrial fibrillation as well as PACs and PVCs, without significant underlying structural heart disease.  Palpitations have not been a big complaint this year.  She has only had a couple of episodes that resolved within 15 minutes of taking metoprolol.  She is compliant with anticoagulation with Eliquis.  She has occasional bruising, but no serious bleeding, falls or injuries.  Coughing and increased secretions due to her bronchiectasis are an occasional issue, but she has not had hemoptysis.  Had GI endoscopy procedures without complications in April  The patient specifically denies any chest pain at rest exertion, dyspnea at rest or with exertion, orthopnea, paroxysmal nocturnal dyspnea, syncope, palpitations, focal neurological deficits, intermittent claudication, lower extremity edema, unexplained weight gain, cough, hemoptysis or wheezing.  Other comorbid conditions include bronchiectasis, gastroesophageal reflux disease, irritable bowel syndrome, osteoporosis, chronic sinusitis and mild anxiety disorder.  Past Medical History:  Diagnosis Date  . Adenomatous colon polyp 10/2003  . Allergic rhinitis   . Allergy    SEASONAL  . Bronchiectasis   . Chronic sinusitis   . Diverticulosis   . GERD (gastroesophageal reflux disease)   . Hearing loss   . Hemorrhoids   . Insomnia   . Menopausal disorder   . Osteoporosis   . Palpitations   . Paroxysmal A-fib ([place])   . TMJ syndrome   . Urticaria    Past Surgical History:  Procedure Laterality Date  . COLONOSCOPY    . NASAL SINUS  SURGERY    . TYMPANOSTOMY    . VIDEO BRONCHOSCOPY Bilateral 07/16/2016   Procedure: VIDEO BRONCHOSCOPY WITH FLUORO;  Surgeon: Collene Gobble, MD;  Location: [facility];  Service: Cardiopulmonary;  Laterality: Bilateral;     Current Meds  Medication Sig  . acetaminophen (TYLENOL) 500 MG tablet Take 500 mg by mouth every 6 (six) hours as needed for moderate pain or headache.  . calcium carbonate (CALCIUM 600) 1500 (600 Ca) MG TABS tablet Take 600 mg of elemental calcium by mouth daily.   . calcium carbonate (TUMS EX) 750 MG chewable tablet Chew 1 tablet by mouth daily.  . Cholecalciferol (VITAMIN D) 1000 UNITS capsule Take 1,000 Units by mouth daily.    Marland Kitchen dextromethorphan (DELSYM) 30 MG/5ML liquid Take 15-30 mLs 2 (two) times daily as needed by mouth for cough.  Marland Kitchen ELIQUIS 5 MG TABS tablet TAKE 1 TABLET TWICE A DAY  . Estradiol 10 MCG TABS Place 10 mcg 2 (two) times a week vaginally. Pt does not have set days  . famotidine (PEPCID) 40 MG tablet Take 1 tablet (40 mg total) by mouth 2 (two) times daily.  . fluticasone (FLONASE) 50 MCG/ACT nasal spray Place 2 sprays into both nostrils at bedtime as needed for allergies or rhinitis.  Marland Kitchen guaiFENesin (MUCINEX) 600 MG 12 hr tablet Take 600 mg by mouth 2 (two) times daily as needed for cough or to loosen phlegm.   . Lactobacillus Rhamnosus, GG, (CULTURELLE PO) Take 1 tablet by mouth daily.  Marland Kitchen loratadine (CLARITIN) 10 MG tablet Take 10 mg daily as needed by mouth for allergies.   Marland Kitchen  Multiple Vitamins-Minerals (CENTRUM SILVER PO) Take 1 tablet by mouth daily.   Marland Kitchen Respiratory Therapy Supplies (FLUTTER) DEVI Twice a day and prn as needed, may increase if feeling worse  . [DISCONTINUED] metoprolol tartrate (LOPRESSOR) 25 MG tablet Take 0.5 tablets (12.5 mg total) by mouth as needed.     Allergies:   Albuterol, Macrobid [nitrofurantoin macrocrystal], Amoxicillin-pot clavulanate, Avelox [moxifloxacin hcl in nacl], Bactrim [sulfamethoxazole-trimethoprim],  Benzonatate, and Ciprofloxacin   Social History   Tobacco Use  . Smoking status: Never Smoker  . Smokeless tobacco: Never Used  Vaping Use  . Vaping Use: Never used  Substance Use Topics  . Alcohol use: Not Currently    Alcohol/week: 2.0 standard drinks    Types: 2 Glasses of wine per week  . Drug use: No     Family Hx: The patient's family history includes AVM in her brother; Cancer in her maternal grandmother, mother, and paternal uncle; Colon cancer (age of onset: 40) in her mother; Heart disease in her father and maternal grandfather; Hypertension in her father; Rectal cancer in her mother; Seizures in her mother. There is no history of Esophageal cancer.  ROS:   Please see the history of present illness.     All other systems reviewed and are negative.   Prior CV studies:   The following studies were reviewed today: Event monitor January 2020 showing very brief atrial fibrillation and frequent PACs Echo 2017 shows normal left ventricular systolic function, no valvular abnormalities, normal left atrial size  Labs/Other Tests and Data Reviewed:    EKG: Ordered today shows normal sinus rhythm, questionable left atrial abnormality, nonspecific lateral T wave changes, no change from previous tracing.  QTc 426 ms.  Recent Labs: 12/24/2019: BUN 20; Creatinine, Ser 0.75; Hemoglobin 13.9; Platelets 381.0; Potassium 4.0; Sodium 137   Recent Lipid Panel Lab Results  Component Value Date/Time   CHOL 140 07/06/2018 07:55 AM   TRIG 41.0 07/06/2018 07:55 AM   HDL 62.80 07/06/2018 07:55 AM   CHOLHDL 2 07/06/2018 07:55 AM   LDLCALC 69 07/06/2018 07:55 AM    Wt Readings from Last 3 Encounters:  01/11/20 103 lb 6.4 oz (46.9 kg)  12/24/19 100 lb 12.8 oz (45.7 kg)  11/30/19 101 lb (45.8 kg)     Objective:    Vital Signs:  BP 97/61   Pulse 99   Ht 5' 1.5" (1.562 m)   Wt 103 lb 6.4 oz (46.9 kg)   SpO2 100%   BMI 19.22 kg/m     General: Alert, oriented x3, no distress,  very lean Head: no evidence of trauma, PERRL, EOMI, no exophtalmos or lid lag, no myxedema, no xanthelasma; normal ears, nose and oropharynx Neck: normal jugular venous pulsations and no hepatojugular reflux; brisk carotid pulses without delay and no carotid bruits Chest: clear to auscultation, no signs of consolidation by percussion or palpation, normal fremitus, symmetrical and full respiratory excursions Cardiovascular: normal position and quality of the apical impulse, regular rhythm, normal first and second heart sounds, no murmurs, rubs or gallops Abdomen: no tenderness or distention, no masses by palpation, no abnormal pulsatility or arterial bruits, normal bowel sounds, no hepatosplenomegaly Extremities: no clubbing, cyanosis or edema; 2+ radial, ulnar and brachial pulses bilaterally; 2+ right femoral, posterior tibial and dorsalis pedis pulses; 2+ left femoral, posterior tibial and dorsalis pedis pulses; no subclavian or femoral bruits Neurological: grossly nonfocal Psych: Normal mood and affect   ASSESSMENT & PLAN:    1. AFib: Infrequent, minimally symptomatic, appropriately anticoagulated.  Taking metoprolol as needed only. 2. Anticoagulation: Well-tolerated, no bleeding complications.  She should stop the Eliquis 48 hours before the colonoscopy and then restart after the procedure based on the recommendations of her gastroenterologist.  There are no Patient Instructions on file for this visit.   Signed, Sanda Klein, MD  01/11/2020 9:42 AM    [facility]

## 2020-01-27 ENCOUNTER — Telehealth: Payer: Self-pay | Admitting: Emergency Medicine

## 2020-01-27 NOTE — Telephone Encounter (Signed)
Called and spoke with Patient. Patient request a high dose flu vaccine.  Patient scheduled 01/31/20, at 0930.

## 2020-01-31 ENCOUNTER — Ambulatory Visit: Payer: Medicare Other

## 2020-01-31 DIAGNOSIS — Z20822 Contact with and (suspected) exposure to covid-19: Secondary | ICD-10-CM | POA: Diagnosis not present

## 2020-02-08 ENCOUNTER — Ambulatory Visit: Payer: Medicare Other | Attending: Internal Medicine

## 2020-02-08 DIAGNOSIS — Z23 Encounter for immunization: Secondary | ICD-10-CM

## 2020-02-08 NOTE — Progress Notes (Signed)
   Covid-19 Vaccination Clinic  Name:  LETITA PRENTISS    MRN: [id number] DOB: [birth year]  02/08/2020  Ms. Hannen was observed post Covid-19 immunization for 15 minutes without incident. She was provided with Vaccine Information Sheet and instruction to access the V-Safe system.   Ms. Simkin was instructed to call 911 with any severe reactions post vaccine: Marland Kitchen Difficulty breathing  . Swelling of face and throat  . A fast heartbeat  . A bad rash all over body  . Dizziness and weakness

## 2020-02-17 ENCOUNTER — Telehealth: Payer: Self-pay | Admitting: Emergency Medicine

## 2020-02-18 NOTE — Telephone Encounter (Signed)
Called and spoke with patient.  Patient scheduled for high dose flu 02/21/20 at 0945.

## 2020-02-21 ENCOUNTER — Other Ambulatory Visit: Payer: Self-pay

## 2020-02-21 ENCOUNTER — Ambulatory Visit (INDEPENDENT_AMBULATORY_CARE_PROVIDER_SITE_OTHER): Payer: Medicare Other

## 2020-02-21 DIAGNOSIS — Z23 Encounter for immunization: Secondary | ICD-10-CM

## 2020-02-28 ENCOUNTER — Ambulatory Visit
Admission: RE | Admit: 2020-02-28 | Discharge: 2020-02-28 | Disposition: A | Discharge status: Home / Self Care | Payer: Medicare Other | Source: Ambulatory Visit | Attending: Internal Medicine | Admitting: Internal Medicine

## 2020-02-28 ENCOUNTER — Ambulatory Visit (INDEPENDENT_AMBULATORY_CARE_PROVIDER_SITE_OTHER): Payer: Medicare Other | Admitting: Internal Medicine

## 2020-02-28 ENCOUNTER — Encounter: Payer: Self-pay | Admitting: Internal Medicine

## 2020-02-28 ENCOUNTER — Other Ambulatory Visit: Payer: Self-pay

## 2020-02-28 ENCOUNTER — Telehealth: Payer: Self-pay | Admitting: Internal Medicine

## 2020-02-28 DIAGNOSIS — K5730 Diverticulosis of large intestine without perforation or abscess without bleeding: Secondary | ICD-10-CM | POA: Diagnosis not present

## 2020-02-28 DIAGNOSIS — R1032 Left lower quadrant pain: Secondary | ICD-10-CM

## 2020-02-28 DIAGNOSIS — K7689 Other specified diseases of liver: Secondary | ICD-10-CM | POA: Diagnosis not present

## 2020-02-28 DIAGNOSIS — I700 Atherosclerosis of aorta: Secondary | ICD-10-CM | POA: Diagnosis not present

## 2020-02-28 DIAGNOSIS — K5900 Constipation, unspecified: Secondary | ICD-10-CM | POA: Insufficient documentation

## 2020-02-28 DIAGNOSIS — J479 Bronchiectasis, uncomplicated: Secondary | ICD-10-CM | POA: Diagnosis not present

## 2020-02-28 LAB — COMPREHENSIVE METABOLIC PANEL
ALT: 14 U/L (ref 0–35)
AST: 17 U/L (ref 0–37)
Albumin: 4.4 g/dL (ref 3.5–5.2)
Alkaline Phosphatase: 75 U/L (ref 39–117)
BUN: 15 mg/dL (ref 6–23)
CO2: 29 mEq/L (ref 19–32)
Calcium: 9.6 mg/dL (ref 8.4–10.5)
Chloride: 98 mEq/L (ref 96–112)
Creatinine, Ser: 0.7 mg/dL (ref 0.40–1.20)
GFR: 84.88 mL/min (ref >60.00)
Glucose, Bld: 92 mg/dL (ref 70–99)
Potassium: 4.3 mEq/L (ref 3.5–5.1)
Sodium: 135 mEq/L (ref 135–145)
Total Bilirubin: 0.6 mg/dL (ref 0.2–1.2)
Total Protein: 7.2 g/dL (ref 6.0–8.3)

## 2020-02-28 LAB — CBC WITH DIFFERENTIAL/PLATELET
Basophils Absolute: 0.1 10*3/uL (ref 0.0–0.1)
Basophils Relative: 0.6 % (ref 0.0–3.0)
Eosinophils Absolute: 0 10*3/uL (ref 0.0–0.7)
Eosinophils Relative: 0.5 % (ref 0.0–5.0)
HCT: 41.6 % (ref 36.0–46.0)
Hemoglobin: 14.2 g/dL (ref 12.0–15.0)
Lymphocytes Relative: 13.3 % (ref 12.0–46.0)
Lymphs Abs: 1.2 10*3/uL (ref 0.7–4.0)
MCHC: 34.2 g/dL (ref 30.0–36.0)
MCV: 91 fl (ref 78.0–100.0)
Monocytes Absolute: 0.7 10*3/uL (ref 0.1–1.0)
Monocytes Relative: 8.2 % (ref 3.0–12.0)
Neutro Abs: 6.9 10*3/uL (ref 1.4–7.7)
Neutrophils Relative %: 77.4 % — ABNORMAL HIGH (ref 43.0–77.0)
Platelets: 408 10*3/uL — ABNORMAL HIGH (ref 150.0–400.0)
RBC: 4.57 Mil/uL (ref 3.87–5.11)
RDW: 12.6 % (ref 11.5–15.5)
WBC: 8.9 10*3/uL (ref 4.0–10.5)

## 2020-02-28 LAB — AMYLASE: Amylase: 42 U/L (ref 27–131)

## 2020-02-28 LAB — LIPASE: Lipase: 16 U/L (ref 11.0–59.0)

## 2020-02-28 MED ORDER — IOPAMIDOL (ISOVUE-300) INJECTION 61%
100.0000 mL | Freq: Once | INTRAVENOUS | Status: AC | PRN
  Administered 2020-02-28: 100 mL via INTRAVENOUS

## 2020-02-28 NOTE — Patient Instructions (Addendum)
  Blood work was ordered.   Have this done downstairs.       A CT scan was ordered to rule out diverticulitis.     [place]

## 2020-02-28 NOTE — Telephone Encounter (Signed)
Pt called with Dr Quay Burow response on the lab work & results.  Pt verb understanding & has no ques/concerns at this time.

## 2020-02-28 NOTE — Assessment & Plan Note (Signed)
Acute Bowels are typically regular as long as she eats bran daily 1 week of constipation and now lower abdominal pain, concentrated in left lower quadrant Concern for diverticulitis given her history of diverticulosis CT of the abdomen pelvis today Treatment based on above results

## 2020-02-28 NOTE — Telephone Encounter (Signed)
Call her with her results -    Lab work looks ok.  Ct scan does not show a reason for pain - there is no diverticulitis.     Continue Bran daily.  If she still feels constipated can take miralax daily until she feels cleaned out.   No additional treatment needed  Call if symptoms do not improve   Lungs with chronic findings - can have pulmonary review her recent CT when she sees them next month.

## 2020-02-28 NOTE — Assessment & Plan Note (Addendum)
Acute Pain across lower abdomen, but concentrated in left lower quadrant Associated with constipation Concern for possible diverticulitis CT of the abdomen and pelvis today-shows no evidence of diverticulitis or cause for acute pain. CBC, CMP, amylase and lipase-normal except mild elevated neutrophils and platelet count Advised to continue bran and if feeling constipated can continue to use MiraLAX Call if symptoms do not improve

## 2020-02-28 NOTE — Progress Notes (Signed)
Subjective:    Patient ID: Monica [name], female    DOB: 1945/06/10, 74 y.o.   MRN: [id number]  HPI The patient is here for an acute visit.  She has chronic constipation, which is typically controlled with taking bran daily.  In the past she has been able to this a day here they are not had any issues.  Last Tuesday she did not take it on Wednesday she had minimal bowel movement.  Thursday she was all of the blocked up and took her bran and MiraLAX.  She tried to self disimpact herself.  Friday she gave herself a Fleet enema with good results.  Since then she has had small bowel movements, but still they are not back to normal.  This is very unusual for her.  She has pain across her lower abdomen and nausea.  She has diverticulosis, but has never had an episode of diverticulitis.  Her last colonoscopy was April of this year.  Medications and allergies reviewed with patient and updated if appropriate.  Patient Active Problem List   Diagnosis Date Noted  . Dysuria 09/21/2018  . Abdominal pain 09/21/2018  . Sepsis ([place]) 06/07/2018  . Thrush, oral 02/10/2018  . Weight loss 11/12/2017  . Acute intractable headache 12/27/2016  . Cough 05/27/2016  . Dizziness 01/28/2016  . Headache 09/29/2015  . PAF (paroxysmal atrial fibrillation) ([place]) 09/19/2015  . Allergic reaction 03/22/2015  . Mastoiditis of right side 03/13/2015  . Chest pain, atypical 04/13/2014  . Healthcare maintenance 02/16/2013  . Elevated CA-125 02/16/2013  . IBS (irritable bowel syndrome) 11/25/2010  . ANXIETY DISORDER, SITUATIONAL, MILD 10/20/2009  . ALLERGIC RHINITIS, SEASONAL 04/23/2007  . GERD 03/10/2007  . Insomnia disorder 01/26/2007  . HEARING LOSS, LEFT EAR 01/26/2007  . Sinusitis, chronic 01/26/2007  . Bronchiectasis without acute exacerbation ([place]) 01/26/2007  . TMJ SYNDROME 01/26/2007  . MENOPAUSAL DISORDER 01/26/2007  . OSTEOPOROSIS 01/26/2007  . Palpitations 01/26/2007    Current Outpatient  Medications on File Prior to Visit  Medication Sig Dispense Refill  . acetaminophen (TYLENOL) 500 MG tablet Take 500 mg by mouth every 6 (six) hours as needed for moderate pain or headache.    . calcium carbonate (CALCIUM 600) 1500 (600 Ca) MG TABS tablet Take 600 mg of elemental calcium by mouth daily.     . calcium carbonate (TUMS EX) 750 MG chewable tablet Chew 1 tablet by mouth daily.    . Cholecalciferol (VITAMIN D) 1000 UNITS capsule Take 1,000 Units by mouth daily.      Marland Kitchen dextromethorphan (DELSYM) 30 MG/5ML liquid Take 15-30 mLs 2 (two) times daily as needed by mouth for cough.    Marland Kitchen ELIQUIS 5 MG TABS tablet TAKE 1 TABLET TWICE A DAY 180 tablet 3  . Estradiol 10 MCG TABS Place 10 mcg 2 (two) times a week vaginally. Pt does not have set days    . famotidine (PEPCID) 40 MG tablet Take 1 tablet (40 mg total) by mouth 2 (two) times daily. 60 tablet 11  . fluticasone (FLONASE) 50 MCG/ACT nasal spray Place 2 sprays into both nostrils at bedtime as needed for allergies or rhinitis.    Marland Kitchen guaiFENesin (MUCINEX) 600 MG 12 hr tablet Take 600 mg by mouth 2 (two) times daily as needed for cough or to loosen phlegm.     . Lactobacillus Rhamnosus, GG, (CULTURELLE PO) Take 1 tablet by mouth daily.    Marland Kitchen loratadine (CLARITIN) 10 MG tablet Take 10 mg daily as needed  by mouth for allergies.     . methylPREDNISolone (MEDROL DOSEPAK) 4 MG TBPK tablet As directed 21 tablet 0  . metoprolol tartrate (LOPRESSOR) 25 MG tablet Take 0.5 tablets (12.5 mg total) by mouth as needed. 15 tablet 1  . montelukast (SINGULAIR) 10 MG tablet Take 1 tablet (10 mg total) by mouth daily. 30 tablet 11  . Multiple Vitamins-Minerals (CENTRUM SILVER PO) Take 1 tablet by mouth daily.     . pantoprazole (PROTONIX) 40 MG tablet Take 1 tablet (40 mg total) by mouth daily. 30 tablet 2  . Respiratory Therapy Supplies (FLUTTER) DEVI Twice a day and prn as needed, may increase if feeling worse 1 each 0   No current facility-administered  medications on file prior to visit.    Past Medical History:  Diagnosis Date  . Adenomatous colon polyp 10/2003  . Allergic rhinitis   . Allergy    SEASONAL  . Bronchiectasis   . Chronic sinusitis   . Diverticulosis   . GERD (gastroesophageal reflux disease)   . Hearing loss   . Hemorrhoids   . Insomnia   . Menopausal disorder   . Osteoporosis   . Palpitations   . Paroxysmal A-fib ([place])   . TMJ syndrome   . Urticaria     Past Surgical History:  Procedure Laterality Date  . COLONOSCOPY    . NASAL SINUS SURGERY    . TYMPANOSTOMY    . VIDEO BRONCHOSCOPY Bilateral 07/16/2016   Procedure: VIDEO BRONCHOSCOPY WITH FLUORO;  Surgeon: Collene Gobble, MD;  Location: [facility];  Service: Cardiopulmonary;  Laterality: Bilateral;    Social History   Socioeconomic History  . Marital status: Married    Spouse name: [name]  . Number of children: 0  . Years of education: BSN  . Highest education level: Not on file  Occupational History  . Occupation: retired Therapist, sports  Tobacco Use  . Smoking status: Never Smoker  . Smokeless tobacco: Never Used  Vaping Use  . Vaping Use: Never used  Substance and Sexual Activity  . Alcohol use: Not Currently    Alcohol/week: 2.0 standard drinks    Types: 2 Glasses of wine per week  . Drug use: No  . Sexual activity: Not Currently  Other Topics Concern  . Not on file  Social History Narrative   Lives with husband   Caffeine use: 1 cup tea per day   Social Determinants of Health   Financial Resource Strain:   . Difficulty of Paying Living Expenses: Not on file  Food Insecurity:   . Worried About Charity fundraiser in the Last Year: Not on file  . Ran Out of Food in the Last Year: Not on file  Transportation Needs:   . Lack of Transportation (Medical): Not on file  . Lack of Transportation (Non-Medical): Not on file  Physical Activity:   . Days of Exercise per Week: Not on file  . Minutes of Exercise per Session: Not on file  Stress:   .  Feeling of Stress : Not on file  Social Connections:   . Frequency of Communication with Friends and Family: Not on file  . Frequency of Social Gatherings with Friends and Family: Not on file  . Attends Religious Services: Not on file  . Active Member of Clubs or Organizations: Not on file  . Attends Archivist Meetings: Not on file  . Marital Status: Not on file    Family History  Problem Relation Age of  Onset  . Rectal cancer Mother        died age 71  . Seizures Mother   . Colon cancer Mother 59  . Cancer Mother   . Heart disease Father        dies age 33  . Hypertension Father   . Heart disease Maternal Grandfather   . AVM Brother   . Cancer Maternal Grandmother   . Cancer Paternal Uncle        multiple uncles with cancer  . Esophageal cancer Neg Hx     Review of Systems  Constitutional: Negative for fever.  Gastrointestinal: Positive for abdominal pain (across lower abd), constipation and nausea. Negative for blood in stool.  Genitourinary: Negative for dysuria and frequency.  Neurological: Positive for headaches.       Objective:   Vitals:   02/28/20 1124  BP: 100/70  Pulse: 93  Temp: 97.8 F (36.6 C)  SpO2: 98%   BP Readings from Last 3 Encounters:  02/28/20 100/70  01/11/20 97/61  12/24/19 112/60   Wt Readings from Last 3 Encounters:  02/28/20 100 lb (45.4 kg)  01/11/20 103 lb 6.4 oz (46.9 kg)  12/24/19 100 lb 12.8 oz (45.7 kg)   Body mass index is 18.59 kg/m.   Physical Exam Constitutional:      General: She is not in acute distress.    Appearance: Normal appearance. She is not ill-appearing.  HENT:     Head: Normocephalic and atraumatic.  Abdominal:     General: There is no distension.     Palpations: Abdomen is soft. There is no mass.     Tenderness: There is abdominal tenderness. There is no guarding or rebound.     Hernia: No hernia is present.  Skin:    General: Skin is warm and dry.  Neurological:     Mental Status: She  is alert.       CT Abdomen Pelvis W Contrast CLINICAL DATA:  Intermittent left lower quadrant abdominal pain for 4 days. Diverticulitis suspected.  EXAM: CT ABDOMEN AND PELVIS WITH CONTRAST  TECHNIQUE: Multidetector CT imaging of the abdomen and pelvis was performed using the standard protocol following bolus administration of intravenous contrast.  CONTRAST:  12mL ISOVUE-300 IOPAMIDOL (ISOVUE-300) INJECTION 61%  COMPARISON:  Abdominopelvic CT 09/24/2018 and 11/26/2012. Chest CT 07/10/2018.  FINDINGS: Lower chest: Grossly stable appearance of the lung bases with bronchiectasis, mucous plugging and peribronchovascular nodularity. No significant pleural or pericardial effusion.  Hepatobiliary: The liver is normal in density without suspicious focal abnormality. Stable tiny cyst in the left hepatic lobe. No evidence of gallstones, gallbladder wall thickening or biliary dilatation.  Pancreas: Unremarkable. No pancreatic ductal dilatation or surrounding inflammatory changes.  Spleen: Normal in size without focal abnormality.  Adrenals/Urinary Tract: Both adrenal glands appear normal. The kidneys appear normal without evidence of urinary tract calculus, suspicious lesion or hydronephrosis. No bladder abnormalities are seen.  Stomach/Bowel: No evidence of bowel wall thickening, distention or surrounding inflammatory change. There are diverticular changes throughout the colon, but no evidence of diverticulitis.  Vascular/Lymphatic: There are no enlarged abdominal or pelvic lymph nodes. Mild aortic and branch vessel atherosclerosis without acute vascular findings. The portal, superior mesenteric and splenic veins are patent.  Reproductive: Grossly stable low-density posteriorly in the lower uterine segment, measuring up to 1.7 cm on image 4/8. This may reflect an atypical fibroid or nabothian cyst. Stable 2.1 cm low-density left adnexal lesion on image 54/2. Both of  these findings appear similar to  remote study from 2014, consistent with benign findings.  Other: No evidence of abdominal wall mass or hernia. No ascites.  Musculoskeletal: No acute or significant osseous findings.  IMPRESSION: 1. No acute findings or explanation for the patient's symptoms. 2. Colonic diverticulosis without evidence of diverticulitis. 3. Grossly stable appearance of the lung bases with bronchiectasis, mucous plugging and peribronchovascular nodularity, suggesting chronic atypical mycobacterial infection. 4. Stable low-density left adnexal and posterior uterine lesions consistent with benign findings. 5. Aortic Atherosclerosis (ICD10-I70.0).  Electronically Signed   By: Richardean Sale M.D.   On: 02/28/2020 14:07   Lab Results  Component Value Date   WBC 8.9 02/28/2020   HGB 14.2 02/28/2020   HCT 41.6 02/28/2020   PLT 408.0 (H) 02/28/2020   GLUCOSE 92 02/28/2020   CHOL 140 07/06/2018   TRIG 41.0 07/06/2018   HDL 62.80 07/06/2018   LDLCALC 69 07/06/2018   ALT 14 02/28/2020   AST 17 02/28/2020   NA 135 02/28/2020   K 4.3 02/28/2020   CL 98 02/28/2020   CREATININE 0.70 02/28/2020   BUN 15 02/28/2020   CO2 29 02/28/2020   TSH 6.19 (H) 07/06/2018       Assessment & Plan:    See Problem List for Assessment and Plan of chronic medical problems.    This visit occurred during the SARS-CoV-2 public health emergency.  Safety protocols were in place, including screening questions prior to the visit, additional usage of staff PPE, and extensive cleaning of exam room while observing appropriate contact time as indicated for disinfecting solutions.

## 2020-03-03 ENCOUNTER — Encounter: Payer: Self-pay | Admitting: Internal Medicine

## 2020-03-04 MED ORDER — ONDANSETRON 4 MG PO TBDP: 4.0000 mg | ORAL_TABLET | Freq: Three times a day (TID) | ORAL | 0 refills | Status: DC | PRN

## 2020-03-04 MED ORDER — HYOSCYAMINE SULFATE SL 0.125 MG SL SUBL: 1.0000 | SUBLINGUAL_TABLET | SUBLINGUAL | 0 refills | Status: DC | PRN

## 2020-03-04 NOTE — Addendum Note (Signed)
Addended by: Binnie Rail on: 03/04/2020 03:33 PM   Modules accepted: Orders

## 2020-03-08 ENCOUNTER — Encounter: Payer: Self-pay | Admitting: Physician Assistant

## 2020-03-08 ENCOUNTER — Ambulatory Visit (INDEPENDENT_AMBULATORY_CARE_PROVIDER_SITE_OTHER): Payer: Medicare Other | Admitting: Physician Assistant

## 2020-03-08 VITALS — BP 118/60 | HR 87 | Ht 61.5 in | Wt 101.0 lb

## 2020-03-08 DIAGNOSIS — K2100 Gastro-esophageal reflux disease with esophagitis, without bleeding: Secondary | ICD-10-CM | POA: Diagnosis not present

## 2020-03-08 MED ORDER — SUCRALFATE 1 G PO TABS: 1.0000 g | ORAL_TABLET | Freq: Every day | ORAL | 0 refills | Status: DC

## 2020-03-08 NOTE — Progress Notes (Signed)
Reviewed and agree with management plan.  Yanis Larin T. Leena Tiede, MD FACG [facility]  

## 2020-03-08 NOTE — Progress Notes (Signed)
Chief Complaint: GERD  HPI:    Monica [name] is a 74 year old Caucasian female with a past medical history as listed including GERD and A. fib on Eliquis, known to [name], who presents to clinic today for GERD.    08/2019 EGD with LA grade B reflux esophagitis, benign-appearing esophageal stenosis and small hiatal hernia.    09/22/2019 patient seen in clinic by [name] for GERD.  At that time discussed ongoing symptoms with cough and mucus in her throat.  A 43-month trial of Dexilant 60 mg daily did not control her symptoms.  She is not sure if famotidine twice daily is working as well as a PPI.  That time patient wanted pantoprazole, she is starting 40 mg every morning and they discussed adding famotidine 40 mg every evening if symptoms not in better control in 3 to 4 weeks.  Was recommended she contact the office in 3 months to reassess management plan on Pantoprazole.    Today, patient presents to clinic and explains that after getting her flu shot for about 2 weeks she has a lot of nonspecific GI symptoms including constipation, nausea and abdominal discomfort, she ended up having a CT as above which was normal and tells me that now she is back to normal stools and is feeling better every day with just a tiny bit of lingering nausea and occasional stomach discomfort.      Her main concern today is that she continues with reflux symptoms.  She reminds me of her history of difficult to control reflux symptoms which is never really improved on any of her medications given to her including various PPIs and currently Pepcid 40 mg twice daily.  Tells me that typically in the afternoon around 2 or 3 she will get a lot of mucus in her throat and have some reflux which causes her to cough for a time until it all clears and then she seems to be better, during this time she occasionally uses Tums which seems to work some.  Asked if there is anything else she can do.  Reminds me of her concerns of osteoporosis  with PPIs.    Patient does have a husband in memory care and goes to feed him lunch every day, typically does not eat until around 1:30 PM    Denies fever, chills, weight loss or blood in her stools  Past Medical History:  Diagnosis Date  . Adenomatous colon polyp 10/2003  . Allergic rhinitis   . Allergy    SEASONAL  . Bronchiectasis   . Chronic sinusitis   . Diverticulosis   . GERD (gastroesophageal reflux disease)   . Hearing loss   . Hemorrhoids   . Insomnia   . Menopausal disorder   . Osteoporosis   . Palpitations   . Paroxysmal A-fib ([place])   . TMJ syndrome   . Urticaria     Past Surgical History:  Procedure Laterality Date  . COLONOSCOPY    . NASAL SINUS SURGERY    . TYMPANOSTOMY    . VIDEO BRONCHOSCOPY Bilateral 07/16/2016   Procedure: VIDEO BRONCHOSCOPY WITH FLUORO;  Surgeon: Collene Gobble, MD;  Location: [facility];  Service: Cardiopulmonary;  Laterality: Bilateral;    Current Outpatient Medications  Medication Sig Dispense Refill  . acetaminophen (TYLENOL) 500 MG tablet Take 500 mg by mouth every 6 (six) hours as needed for moderate pain or headache.    . calcium carbonate (CALCIUM 600) 1500 (600 Ca) MG TABS tablet Take  600 mg of elemental calcium by mouth daily.     . calcium carbonate (TUMS EX) 750 MG chewable tablet Chew 1 tablet by mouth daily.    . Cholecalciferol (VITAMIN D) 1000 UNITS capsule Take 1,000 Units by mouth daily.      Marland Kitchen dextromethorphan (DELSYM) 30 MG/5ML liquid Take 15-30 mLs 2 (two) times daily as needed by mouth for cough.    Marland Kitchen ELIQUIS 5 MG TABS tablet TAKE 1 TABLET TWICE A DAY 180 tablet 3  . Estradiol 10 MCG TABS Place 10 mcg 2 (two) times a week vaginally. Pt does not have set days    . famotidine (PEPCID) 40 MG tablet Take 1 tablet (40 mg total) by mouth 2 (two) times daily. 60 tablet 11  . fluticasone (FLONASE) 50 MCG/ACT nasal spray Place 2 sprays into both nostrils at bedtime as needed for allergies or rhinitis.    Marland Kitchen guaiFENesin  (MUCINEX) 600 MG 12 hr tablet Take 600 mg by mouth 2 (two) times daily as needed for cough or to loosen phlegm.     Marland Kitchen Hyoscyamine Sulfate SL (LEVSIN/SL) 0.125 MG SUBL Place 1 tablet under the tongue every 4 (four) hours as needed (GI tract spasms). 30 tablet 0  . Lactobacillus Rhamnosus, GG, (CULTURELLE PO) Take 1 tablet by mouth daily.    Marland Kitchen loratadine (CLARITIN) 10 MG tablet Take 10 mg daily as needed by mouth for allergies.     . metoprolol tartrate (LOPRESSOR) 25 MG tablet Take 0.5 tablets (12.5 mg total) by mouth as needed. 15 tablet 1  . montelukast (SINGULAIR) 10 MG tablet Take 1 tablet (10 mg total) by mouth daily. 30 tablet 11  . Multiple Vitamins-Minerals (CENTRUM SILVER PO) Take 1 tablet by mouth daily.     . ondansetron (ZOFRAN ODT) 4 MG disintegrating tablet Take 1 tablet (4 mg total) by mouth every 8 (eight) hours as needed for nausea or vomiting. 20 tablet 0  . pantoprazole (PROTONIX) 40 MG tablet Take 1 tablet (40 mg total) by mouth daily. 30 tablet 2  . Respiratory Therapy Supplies (FLUTTER) DEVI Twice a day and prn as needed, may increase if feeling worse 1 each 0   No current facility-administered medications for this visit.    Allergies as of 03/08/2020 - Review Complete 02/28/2020  Allergen Reaction Noted  . Albuterol Other (See Comments) 08/04/2017  . Macrobid [nitrofurantoin macrocrystal]  09/21/2018  . Amoxicillin-pot clavulanate Diarrhea   . Avelox [moxifloxacin hcl in nacl] Nausea Only 11/25/2012  . Bactrim [sulfamethoxazole-trimethoprim] Other (See Comments)   . Benzonatate Other (See Comments) 10/20/2009  . Ciprofloxacin Rash 03/20/2015    Family History  Problem Relation Age of Onset  . Rectal cancer Mother        died age 17  . Seizures Mother   . Colon cancer Mother 64  . Cancer Mother   . Heart disease Father        dies age 21  . Hypertension Father   . Heart disease Maternal Grandfather   . AVM Brother   . Cancer Maternal Grandmother   . Cancer  Paternal Uncle        multiple uncles with cancer  . Esophageal cancer Neg Hx     Social History   Socioeconomic History  . Marital status: Married    Spouse name: [name]  . Number of children: 0  . Years of education: BSN  . Highest education level: Not on file  Occupational History  . Occupation: retired Therapist, sports  Tobacco Use  . Smoking status: Never Smoker  . Smokeless tobacco: Never Used  Vaping Use  . Vaping Use: Never used  Substance and Sexual Activity  . Alcohol use: Not Currently    Alcohol/week: 2.0 standard drinks    Types: 2 Glasses of wine per week  . Drug use: No  . Sexual activity: Not Currently  Other Topics Concern  . Not on file  Social History Narrative   Lives with husband   Caffeine use: 1 cup tea per day   Social Determinants of Health   Financial Resource Strain:   . Difficulty of Paying Living Expenses: Not on file  Food Insecurity:   . Worried About Charity fundraiser in the Last Year: Not on file  . Ran Out of Food in the Last Year: Not on file  Transportation Needs:   . Lack of Transportation (Medical): Not on file  . Lack of Transportation (Non-Medical): Not on file  Physical Activity:   . Days of Exercise per Week: Not on file  . Minutes of Exercise per Session: Not on file  Stress:   . Feeling of Stress : Not on file  Social Connections:   . Frequency of Communication with Friends and Family: Not on file  . Frequency of Social Gatherings with Friends and Family: Not on file  . Attends Religious Services: Not on file  . Active Member of Clubs or Organizations: Not on file  . Attends Archivist Meetings: Not on file  . Marital Status: Not on file  Intimate Partner Violence:   . Fear of Current or Ex-Partner: Not on file  . Emotionally Abused: Not on file  . Physically Abused: Not on file  . Sexually Abused: Not on file    Review of Systems:    Constitutional: No weight loss, fever or chills Cardiovascular: No chest  pain Respiratory: No SOB  Gastrointestinal: See HPI and otherwise negative   Physical Exam:  Vital signs: BP 118/60   Pulse 87   Ht 5' 1.5" (1.562 m)   Wt 101 lb (45.8 kg)   BMI 18.77 kg/m   Constitutional:   Pleasant Caucasian female appears to be in NAD, Well developed, Well nourished, alert and cooperative Respiratory: Respirations even and unlabored. Lungs clear to auscultation bilaterally.   No wheezes, crackles, or rhonchi.  Cardiovascular: Normal S1, S2. No MRG. Regular rate and rhythm. No peripheral edema, cyanosis or pallor.  Gastrointestinal:  Soft, nondistended, nontender. No rebound or guarding. Normal bowel sounds. No appreciable masses or hepatomegaly. Rectal:  Not performed.  Psychiatric: Demonstrates good judgement and reason without abnormal affect or behaviors.  RELEVANT LABS AND IMAGING: CBC    Component Value Date/Time   WBC 8.9 02/28/2020 1218   RBC 4.57 02/28/2020 1218   HGB 14.2 02/28/2020 1218   HGB 13.0 09/25/2016 1017   HCT 41.6 02/28/2020 1218   HCT 37.7 09/25/2016 1017   PLT 408.0 (H) 02/28/2020 1218   PLT 411 (H) 09/25/2016 1017   MCV 91.0 02/28/2020 1218   MCV 88 09/25/2016 1017   MCH 30.0 06/08/2018 0544   MCHC 34.2 02/28/2020 1218   RDW 12.6 02/28/2020 1218   RDW 12.8 09/25/2016 1017   LYMPHSABS 1.2 02/28/2020 1218   LYMPHSABS 1.5 09/25/2016 1017   MONOABS 0.7 02/28/2020 1218   EOSABS 0.0 02/28/2020 1218   EOSABS 0.2 09/25/2016 1017   BASOSABS 0.1 02/28/2020 1218   BASOSABS 0.0 09/25/2016 1017    CMP  Component Value Date/Time   NA 135 02/28/2020 1218   K 4.3 02/28/2020 1218   CL 98 02/28/2020 1218   CO2 29 02/28/2020 1218   GLUCOSE 92 02/28/2020 1218   BUN 15 02/28/2020 1218   CREATININE 0.70 02/28/2020 1218   CALCIUM 9.6 02/28/2020 1218   PROT 7.2 02/28/2020 1218   ALBUMIN 4.4 02/28/2020 1218   AST 17 02/28/2020 1218   ALT 14 02/28/2020 1218   ALKPHOS 75 02/28/2020 1218   BILITOT 0.6 02/28/2020 1218   GFRNONAA >60  06/08/2018 0544   GFRAA >60 06/08/2018 0544    Assessment: 1.  GERD: Patient has complicated history as reflux symptoms, which have never really been better on any PPIs or H2 blockers, asking if there is anything else today, recent EGD earlier this year with esophagitis and gastritis  Plan: 1.  Patient questioning if Tagamet would be worth a try.  Recommend that she stop her Pepcid and try Tagamet if she would like, will use this in place of it, BID. 2.  Prescribed Carafate 1 g in the afternoons, told patient she can try using this 30 minutes before eating her lunch at 130 or try using it afterwards to see if it helps with reflux symptoms which seem to happen around that time.  Prescribed #15 as patient does not want a lot of pills in case she does not like it.  She can call back for refills. 3.  Patient would like to follow-up with [name] in January just to rediscuss things.  Put her in recall.  Ellouise Newer, PA-C [facility] 03/08/2020, 8:57 AM  Cc: Plotnikov, Evie Lacks, MD

## 2020-03-08 NOTE — Patient Instructions (Signed)
If you are age 75 or older, your body mass index should be between 23-30. Your Body mass index is 18.77 kg/m. If this is out of the aforementioned range listed, please consider follow up with your Primary Care Provider.  If you are age 36 or younger, your body mass index should be between 19-25. Your Body mass index is 18.77 kg/m. If this is out of the aformentioned range listed, please consider follow up with your Primary Care Provider.   Can try OTC Tagamet twice daily in place of Pepcid.   We have sent the following medications to your pharmacy for you to pick up at your convenience: Carafate 1 G take daily around lunch time.

## 2020-03-12 ENCOUNTER — Other Ambulatory Visit: Payer: Self-pay

## 2020-03-12 ENCOUNTER — Emergency Department (HOSPITAL_COMMUNITY)
Admission: EM | Admit: 2020-03-12 | Discharge: 2020-03-12 | Disposition: A | Discharge status: Home / Self Care | Payer: Medicare Other | Attending: Emergency Medicine | Admitting: Emergency Medicine

## 2020-03-12 ENCOUNTER — Emergency Department (HOSPITAL_COMMUNITY): Payer: Medicare Other

## 2020-03-12 ENCOUNTER — Encounter (HOSPITAL_COMMUNITY): Payer: Self-pay | Admitting: Obstetrics and Gynecology

## 2020-03-12 DIAGNOSIS — R000 Tachycardia, unspecified: Secondary | ICD-10-CM | POA: Insufficient documentation

## 2020-03-12 DIAGNOSIS — S0990XA Unspecified injury of head, initial encounter: Secondary | ICD-10-CM | POA: Insufficient documentation

## 2020-03-12 DIAGNOSIS — W228XXA Striking against or struck by other objects, initial encounter: Secondary | ICD-10-CM | POA: Diagnosis not present

## 2020-03-12 NOTE — ED Provider Notes (Signed)
[facility] DEPT Provider Note   CSN: [id number] Arrival date & time: 03/12/20  1453     History Chief Complaint  Patient presents with  . Head Injury    Monica [name] is a 74 y.o. female.  HPI  Elderly female with history of A. fib, on Eliquis presents after head trauma with persistent pain in the crown of her scalp as well as unsettled sensation. She was essentially in her usual state of health until earlier today, when she accidentally struck her head against a piece of furniture.  Since that time she has had her previously described sensations.  No new weakness in any extremity, no syncope, no vomiting.    Past Medical History:  Diagnosis Date  . Adenomatous colon polyp 10/2003  . Allergic rhinitis   . Allergy    SEASONAL  . Bronchiectasis   . Chronic sinusitis   . Diverticulosis   . GERD (gastroesophageal reflux disease)   . Hearing loss   . Hemorrhoids   . Insomnia   . Menopausal disorder   . Osteoporosis   . Palpitations   . Paroxysmal A-fib ([place])   . TMJ syndrome   . Urticaria     Patient Active Problem List   Diagnosis Date Noted  . Constipation 02/28/2020  . Dysuria 09/21/2018  . Abdominal pain 09/21/2018  . Sepsis ([place]) 06/07/2018  . Thrush, oral 02/10/2018  . Weight loss 11/12/2017  . Acute intractable headache 12/27/2016  . Cough 05/27/2016  . Dizziness 01/28/2016  . Headache 09/29/2015  . PAF (paroxysmal atrial fibrillation) ([place]) 09/19/2015  . Allergic reaction 03/22/2015  . Mastoiditis of right side 03/13/2015  . Chest pain, atypical 04/13/2014  . Healthcare maintenance 02/16/2013  . Elevated CA-125 02/16/2013  . IBS (irritable bowel syndrome) 11/25/2010  . ANXIETY DISORDER, SITUATIONAL, MILD 10/20/2009  . ALLERGIC RHINITIS, SEASONAL 04/23/2007  . GERD 03/10/2007  . Insomnia disorder 01/26/2007  . HEARING LOSS, LEFT EAR 01/26/2007  . Sinusitis, chronic 01/26/2007  . Bronchiectasis without acute  exacerbation ([place]) 01/26/2007  . TMJ SYNDROME 01/26/2007  . MENOPAUSAL DISORDER 01/26/2007  . OSTEOPOROSIS 01/26/2007  . Palpitations 01/26/2007    Past Surgical History:  Procedure Laterality Date  . COLONOSCOPY    . NASAL SINUS SURGERY    . TYMPANOSTOMY    . VIDEO BRONCHOSCOPY Bilateral 07/16/2016   Procedure: VIDEO BRONCHOSCOPY WITH FLUORO;  Surgeon: Collene Gobble, MD;  Location: [facility];  Service: Cardiopulmonary;  Laterality: Bilateral;     OB History   No obstetric history on file.     Family History  Problem Relation Age of Onset  . Rectal cancer Mother        died age 7  . Seizures Mother   . Colon cancer Mother 52  . Cancer Mother   . Heart disease Father        dies age 92  . Hypertension Father   . Heart disease Maternal Grandfather   . AVM Brother   . Cancer Maternal Grandmother   . Cancer Paternal Uncle        multiple uncles with cancer  . Esophageal cancer Neg Hx     Social History   Tobacco Use  . Smoking status: Never Smoker  . Smokeless tobacco: Never Used  Vaping Use  . Vaping Use: Never used  Substance Use Topics  . Alcohol use: Not Currently    Alcohol/week: 2.0 standard drinks    Types: 2 Glasses of wine per week  .  Drug use: No    Home Medications Prior to Admission medications   Medication Sig Start Date End Date Taking? Authorizing Provider  acetaminophen (TYLENOL) 500 MG tablet Take 500 mg by mouth every 6 (six) hours as needed for moderate pain or headache.   Yes [provider]  Calcium Carb-Cholecalciferol (CALCIUM 600-D PO) Take 1 tablet by mouth every evening.   Yes [provider]  calcium carbonate (TUMS EX) 750 MG chewable tablet Chew 2 tablets by mouth at bedtime.    Yes [provider]  Cholecalciferol (VITAMIN D) 1000 UNITS capsule Take 1,000 Units by mouth daily.     Yes [provider]  dextromethorphan (DELSYM) 30 MG/5ML liquid Take 30 mg by mouth at bedtime as needed for  cough.    Yes [provider]  ELIQUIS 5 MG TABS tablet TAKE 1 TABLET TWICE A DAY Patient taking differently: Take 5 mg by mouth 2 (two) times daily.  12/09/19  Yes Croitoru, Mihai, MD  Estradiol 10 MCG TABS Place 10 mcg vaginally 2 (two) times a week.    Yes [provider]  famotidine (PEPCID) 40 MG tablet Take 1 tablet (40 mg total) by mouth 2 (two) times daily. Patient taking differently: Take 40 mg by mouth daily.  08/16/19 08/15/20 Yes Ladene Artist, MD  fluticasone (FLONASE) 50 MCG/ACT nasal spray Place 1 spray into both nostrils at bedtime as needed for allergies or rhinitis (seasonal allergies).    Yes [provider]  guaiFENesin (MUCINEX) 600 MG 12 hr tablet Take 600 mg by mouth 2 (two) times daily as needed for cough or to loosen phlegm.    Yes [provider]  Hyoscyamine Sulfate SL (LEVSIN/SL) 0.125 MG SUBL Place 1 tablet under the tongue every 4 (four) hours as needed (GI tract spasms). 03/04/20  Yes Burns, Claudina Lick, MD  Lactobacillus Rhamnosus, GG, (CULTURELLE PO) Take 1 tablet by mouth daily.   Yes [provider]  loratadine (CLARITIN) 10 MG tablet Take 10 mg daily as needed by mouth for allergies.    Yes [provider]  metoprolol tartrate (LOPRESSOR) 25 MG tablet Take 0.5 tablets (12.5 mg total) by mouth as needed. Patient taking differently: Take 12.5 mg by mouth daily as needed (afib episode).  01/11/20  Yes Croitoru, Mihai, MD  Multiple Vitamin (MULTIVITAMIN WITH MINERALS) TABS tablet Take 1 tablet by mouth every evening. Centrum Silver   Yes [provider]  ondansetron (ZOFRAN ODT) 4 MG disintegrating tablet Take 1 tablet (4 mg total) by mouth every 8 (eight) hours as needed for nausea or vomiting. 03/04/20  Yes Burns, Claudina Lick, MD  sucralfate (CARAFATE) 1 g tablet Take 1 tablet (1 g total) by mouth daily. Patient taking differently: Take 1 g by mouth daily as needed (gastric reflux).  03/08/20  Yes Levin Erp, PA  Respiratory Therapy Supplies (FLUTTER) DEVI Twice a day and prn as needed, may increase if feeling worse 01/30/18   Lauraine Rinne, NP    Allergies    Albuterol, Macrobid [nitrofurantoin macrocrystal], Amoxicillin-pot clavulanate, Avelox [moxifloxacin hcl in nacl], Bactrim [sulfamethoxazole-trimethoprim], Benzonatate, and Ciprofloxacin  Review of Systems   Review of Systems  Constitutional:       Per HPI, otherwise negative  HENT:       Per HPI, otherwise negative  Respiratory:       Per HPI, otherwise negative  Cardiovascular:       Per HPI, otherwise negative  Gastrointestinal: Negative for vomiting.  Endocrine:       Negative aside from HPI  Genitourinary:       Neg aside from HPI   Musculoskeletal:       Per HPI, otherwise negative  Skin: Negative.   Neurological: Positive for headaches. Negative for syncope, weakness and numbness.    Physical Exam Updated Vital Signs BP (!) 148/79 (BP Location: Left Arm)   Pulse 98   Temp (!) 97.4 F (36.3 C) (Oral)   Resp 16   SpO2 98%   Physical Exam Vitals and nursing note reviewed.  Constitutional:      General: She is not in acute distress.    Appearance: She is well-developed.  HENT:     Head: Normocephalic and atraumatic.  Eyes:     Conjunctiva/sclera: Conjunctivae normal.  Cardiovascular:     Rate and Rhythm: Regular rhythm. Tachycardia present.     Pulses: Normal pulses.  Pulmonary:     Effort: Pulmonary effort is normal. No respiratory distress.     Breath sounds: Normal breath sounds. No stridor.  Abdominal:     General: There is no distension.  Musculoskeletal:     Cervical back: Normal range of motion and neck supple. No rigidity or tenderness.  Skin:    General: Skin is warm and dry.  Neurological:     Mental Status: She is alert and oriented to person, place, and time.     Cranial Nerves: No cranial nerve deficit.     Motor: No weakness, tremor, atrophy or abnormal muscle tone.      Coordination: Coordination normal.     ED Results / Procedures / Treatments   Labs (all labs ordered are listed, but only abnormal results are displayed) Labs Reviewed - No data to display  EKG None  Radiology CT Head Wo Contrast  Result Date: 03/12/2020 CLINICAL DATA:  Head trauma EXAM: CT HEAD WITHOUT CONTRAST TECHNIQUE: Contiguous axial images were obtained from the base of the skull through the vertex without intravenous contrast. COMPARISON:  None. FINDINGS: Brain: No evidence of acute territorial infarction, hemorrhage, hydrocephalus,extra-axial collection or mass lesion/mass effect. Normal gray-white differentiation. Ventricles are normal in size and contour. Vascular: No hyperdense vessel or unexpected calcification. Skull: The skull is intact. No fracture or focal lesion identified. Sinuses/Orbits: The visualized paranasal sinuses and mastoid air cells are clear. The orbits and globes intact. Other: None IMPRESSION: No acute intracranial abnormality. Electronically Signed   By: Prudencio Pair M.D.   On: 03/12/2020 17:14    Procedures Procedures (including critical care time)  Medications Ordered in ED Medications - No data to display  ED Course  I have reviewed the triage vital signs and the nursing notes.  Pertinent labs & imaging results that were available during my care of the patient were reviewed by me and considered in my medical decision making (see chart for details).    MDM Rules/Calculators/A&P                          5:26 PM Patient in no distress, awake, alert.  Vital signs notable for slight elevation in blood pressure, but subone 4 0 systolic.  We discussed possibilities including white coat syndrome, the patient is appropriate to follow this as an outpatient. With reassuring head CT which I reviewed, discussed with her, no evidence for focal neurologic deficiencies, low suspicion for occult bleed, no evidence for fracture, patient discharged in stable  condition. Final Clinical Impression(s) / ED Diagnoses Final  diagnoses:  Injury of head, initial encounter     Carmin Muskrat, MD 03/12/20 1726

## 2020-03-12 NOTE — ED Triage Notes (Signed)
Patient reports she hit her head on the cabinet and is on Eliquis. Patient reports she has not felt right since.

## 2020-03-12 NOTE — Discharge Instructions (Addendum)
As discussed, your evaluation today has been largely reassuring.  But, it is important that you monitor your condition carefully, and do not hesitate to return to the ED if you develop new, or concerning changes in your condition. ? ?Otherwise, please follow-up with your physician for appropriate ongoing care. ? ?

## 2020-03-21 ENCOUNTER — Encounter: Payer: Self-pay | Admitting: Internal Medicine

## 2020-03-21 ENCOUNTER — Other Ambulatory Visit: Payer: Self-pay

## 2020-03-21 ENCOUNTER — Ambulatory Visit (INDEPENDENT_AMBULATORY_CARE_PROVIDER_SITE_OTHER): Payer: Medicare Other | Admitting: Internal Medicine

## 2020-03-21 ENCOUNTER — Ambulatory Visit
Admission: RE | Admit: 2020-03-21 | Discharge: 2020-03-21 | Disposition: A | Discharge status: Home / Self Care | Payer: Medicare Other | Source: Ambulatory Visit | Attending: Internal Medicine | Admitting: Internal Medicine

## 2020-03-21 DIAGNOSIS — I480 Paroxysmal atrial fibrillation: Secondary | ICD-10-CM

## 2020-03-21 DIAGNOSIS — R410 Disorientation, unspecified: Secondary | ICD-10-CM | POA: Diagnosis not present

## 2020-03-21 DIAGNOSIS — S0990XA Unspecified injury of head, initial encounter: Secondary | ICD-10-CM | POA: Diagnosis not present

## 2020-03-21 DIAGNOSIS — G44319 Acute post-traumatic headache, not intractable: Secondary | ICD-10-CM

## 2020-03-21 DIAGNOSIS — R634 Abnormal weight loss: Secondary | ICD-10-CM | POA: Diagnosis not present

## 2020-03-21 DIAGNOSIS — G459 Transient cerebral ischemic attack, unspecified: Secondary | ICD-10-CM | POA: Diagnosis not present

## 2020-03-21 MED ORDER — BUTALBITAL-ACETAMINOPHEN 50-300 MG PO TABS: 0.5000 | ORAL_TABLET | Freq: Two times a day (BID) | ORAL | 1 refills | Status: DC | PRN

## 2020-03-21 NOTE — Assessment & Plan Note (Addendum)
Sherry headache hit her head on the cabinet on 11/7 - no LOC, CT was (-)   Likely a concussion Fiorinal [facility], Ginseng CT if not better

## 2020-03-21 NOTE — Progress Notes (Signed)
Subjective:  Patient ID: Monica [name], female    DOB: 1945/11/23  Age: 74 y.o. MRN: [id number]  CC: Headache (Pt states she hit her head a week ago on her cabnet. She went to ER had CT everything check out ok, but since then she's been having headaches)   HPI Monica [name] presents for hitting her head on the cabinet on 11/7 - no LOC, CT was (-). C/o HAs C/o fatigue after a flu shot  Outpatient Medications Prior to Visit  Medication Sig Dispense Refill  . acetaminophen (TYLENOL) 500 MG tablet Take 500 mg by mouth every 6 (six) hours as needed for moderate pain or headache.    . Calcium Carb-Cholecalciferol (CALCIUM 600-D PO) Take 1 tablet by mouth every evening.    . calcium carbonate (TUMS EX) 750 MG chewable tablet Chew 2 tablets by mouth at bedtime.     . Cholecalciferol (VITAMIN D) 1000 UNITS capsule Take 1,000 Units by mouth daily.      Marland Kitchen dextromethorphan (DELSYM) 30 MG/5ML liquid Take 30 mg by mouth at bedtime as needed for cough.     Marland Kitchen ELIQUIS 5 MG TABS tablet TAKE 1 TABLET TWICE A DAY (Patient taking differently: Take 5 mg by mouth 2 (two) times daily. ) 180 tablet 3  . Estradiol 10 MCG TABS Place 10 mcg vaginally 2 (two) times a week.     . famotidine (PEPCID) 40 MG tablet Take 1 tablet (40 mg total) by mouth 2 (two) times daily. (Patient taking differently: Take 40 mg by mouth daily. ) 60 tablet 11  . fluticasone (FLONASE) 50 MCG/ACT nasal spray Place 1 spray into both nostrils at bedtime as needed for allergies or rhinitis (seasonal allergies).     Marland Kitchen guaiFENesin (MUCINEX) 600 MG 12 hr tablet Take 600 mg by mouth 2 (two) times daily as needed for cough or to loosen phlegm.     . Lactobacillus Rhamnosus, GG, (CULTURELLE PO) Take 1 tablet by mouth daily.    Marland Kitchen loratadine (CLARITIN) 10 MG tablet Take 10 mg daily as needed by mouth for allergies.     . metoprolol tartrate (LOPRESSOR) 25 MG tablet Take 0.5 tablets (12.5 mg total) by mouth as needed. (Patient taking differently:  Take 12.5 mg by mouth daily as needed (afib episode). ) 15 tablet 1  . Multiple Vitamin (MULTIVITAMIN WITH MINERALS) TABS tablet Take 1 tablet by mouth every evening. Centrum Silver    . Respiratory Therapy Supplies (FLUTTER) DEVI Twice a day and prn as needed, may increase if feeling worse 1 each 0  . Hyoscyamine Sulfate SL (LEVSIN/SL) 0.125 MG SUBL Place 1 tablet under the tongue every 4 (four) hours as needed (GI tract spasms). 30 tablet 0  . ondansetron (ZOFRAN ODT) 4 MG disintegrating tablet Take 1 tablet (4 mg total) by mouth every 8 (eight) hours as needed for nausea or vomiting. 20 tablet 0  . sucralfate (CARAFATE) 1 g tablet Take 1 tablet (1 g total) by mouth daily. (Patient taking differently: Take 1 g by mouth daily as needed (gastric reflux). ) 15 tablet 0   No facility-administered medications prior to visit.    ROS: Review of Systems  Constitutional: Positive for fatigue and unexpected weight change. Negative for activity change, appetite change and chills.  HENT: Negative for congestion, mouth sores and sinus pressure.   Eyes: Negative for visual disturbance.  Respiratory: Negative for cough and chest tightness.   Gastrointestinal: Negative for abdominal pain and nausea.  Genitourinary: Negative  for difficulty urinating, frequency and vaginal pain.  Musculoskeletal: Negative for back pain and gait problem.  Skin: Negative for pallor and rash.  Neurological: Positive for light-headedness and headaches. Negative for dizziness, tremors, weakness and numbness.  Psychiatric/Behavioral: Negative for confusion, sleep disturbance and suicidal ideas.    Objective:  BP 120/62 (BP Location: Left Arm)   Pulse 73   Temp 97.8 F (36.6 C) (Oral)   Wt 101 lb 3.2 oz (45.9 kg)   SpO2 98%   BMI 18.81 kg/m   BP Readings from Last 3 Encounters:  03/21/20 120/62  03/12/20 (!) 152/79  03/08/20 118/60    Wt Readings from Last 3 Encounters:  03/21/20 101 lb 3.2 oz (45.9 kg)  03/08/20  101 lb (45.8 kg)  02/28/20 100 lb (45.4 kg)    Physical Exam Constitutional:      General: She is not in acute distress.    Appearance: She is well-developed.  HENT:     Head: Normocephalic.     Right Ear: External ear normal.     Left Ear: External ear normal.     Nose: Nose normal.  Eyes:     General:        Right eye: No discharge.        Left eye: No discharge.     Conjunctiva/sclera: Conjunctivae normal.     Pupils: Pupils are equal, round, and reactive to light.  Neck:     Thyroid: No thyromegaly.     Vascular: No JVD.     Trachea: No tracheal deviation.  Cardiovascular:     Rate and Rhythm: Normal rate and regular rhythm.     Heart sounds: Normal heart sounds.  Pulmonary:     Effort: No respiratory distress.     Breath sounds: No stridor. No wheezing.  Abdominal:     General: Bowel sounds are normal. There is no distension.     Palpations: Abdomen is soft. There is no mass.     Tenderness: There is no abdominal tenderness. There is no guarding or rebound.  Musculoskeletal:        General: No tenderness.     Cervical back: Normal range of motion and neck supple.  Lymphadenopathy:     Cervical: No cervical adenopathy.  Skin:    Findings: No erythema or rash.  Neurological:     Mental Status: She is oriented to person, place, and time.     Cranial Nerves: No cranial nerve deficit.     Motor: No abnormal muscle tone.     Coordination: Coordination normal.     Deep Tendon Reflexes: Reflexes normal.  Psychiatric:        Behavior: Behavior normal.        Thought Content: Thought content normal.        Judgment: Judgment normal.   Thin Neuro - non-focal No ataxia   Lab Results  Component Value Date   WBC 8.9 02/28/2020   HGB 14.2 02/28/2020   HCT 41.6 02/28/2020   PLT 408.0 (H) 02/28/2020   GLUCOSE 92 02/28/2020   CHOL 140 07/06/2018   TRIG 41.0 07/06/2018   HDL 62.80 07/06/2018   LDLCALC 69 07/06/2018   ALT 14 02/28/2020   AST 17 02/28/2020   NA  135 02/28/2020   K 4.3 02/28/2020   CL 98 02/28/2020   CREATININE 0.70 02/28/2020   BUN 15 02/28/2020   CO2 29 02/28/2020   TSH 6.19 (H) 07/06/2018    CT Head Wo Contrast  Result Date: 03/12/2020 CLINICAL DATA:  Head trauma EXAM: CT HEAD WITHOUT CONTRAST TECHNIQUE: Contiguous axial images were obtained from the base of the skull through the vertex without intravenous contrast. COMPARISON:  None. FINDINGS: Brain: No evidence of acute territorial infarction, hemorrhage, hydrocephalus,extra-axial collection or mass lesion/mass effect. Normal gray-white differentiation. Ventricles are normal in size and contour. Vascular: No hyperdense vessel or unexpected calcification. Skull: The skull is intact. No fracture or focal lesion identified. Sinuses/Orbits: The visualized paranasal sinuses and mastoid air cells are clear. The orbits and globes intact. Other: None IMPRESSION: No acute intracranial abnormality. Electronically Signed   By: Prudencio Pair M.D.   On: 03/12/2020 17:14    Assessment & Plan:    Walker Kehr, MD

## 2020-03-21 NOTE — Patient Instructions (Signed)
Concussion, Adult  A concussion is a brain injury from a hard, direct hit (trauma) to your head or body. This direct hit causes the brain to quickly shake back and forth inside the skull. A concussion may also be called a mild traumatic brain injury (TBI). Healing from this injury can take time. What are the causes? This condition is caused by:  A direct hit to your head, such as: ? Running into a player during a game. ? Being hit in a fight. ? Hitting your head on a hard surface.  A quick and sudden movement (jolt) of the head or neck, such as in a car crash. What are the signs or symptoms? The signs of a concussion can be hard to notice. They may be missed by you, family members, and doctors. You may look fine on the outside but may not act or feel normal. Physical symptoms  Headaches.  Being tired (fatigued).  Being dizzy.  Problems with body balance.  Problems seeing or hearing.  Being sensitive to light or noise.  Feeling sick to your stomach (nausea) or throwing up (vomiting).  Not sleeping or eating as you used to.  Loss of feeling (numbness) or tingling in the body.  Seizure. Mental and emotional symptoms  Problems remembering things.  Trouble focusing your mind (concentrating), organizing, or making decisions.  Being slow to think, act, react, speak, or read.  Feeling grouchy (irritable).  Having mood changes.  Feeling worried or nervous (anxious).  Feeling sad (depressed). How is this treated? This condition may be treated by:  Stopping sports or activity if you are injured. If you hit your head or have signs of concussion: ? Do not return to sports or activities the same day. ? Get checked by a doctor before you return to your activities.  Resting your body and your mind.  Being watched carefully, often at home.  Medicines to help with symptoms such as: ? Feeling sick to your stomach. ? Headaches. ? Problems with sleep.  Avoid taking strong  pain medicines (opioids) for a concussion.  Avoiding alcohol and drugs.  Being asked to go to a concussion clinic or a place to help you recover (rehabilitation center). Recovery from a concussion can take time. Return to activities only:  When you are fully healed.  When your doctor says it is safe. Follow these instructions at home: Activity  Limit activities that need a lot of thought or focus, such as: ? Homework or work for your job. ? Watching TV. ? Using the computer or phone. ? Playing memory games and puzzles.  Rest. Rest helps your brain heal. Make sure you: ? Get plenty of sleep. Most adults should get 7-9 hours of sleep each night. ? Rest during the day. Take naps or breaks when you feel tired.  Avoid activity like exercise until your doctor says its safe. Stop any activity that makes symptoms worse.  Do not do activities that could cause a second concussion, such as riding a bike or playing sports.  Ask your doctor when you can return to your normal activities, such as school, work, sports, and driving. Your ability to react may be slower. Do not do these activities if you are dizzy. General instructions   Take over-the-counter and prescription medicines only as told by your doctor.  Do not drink alcohol until your doctor says you can.  Watch your symptoms and tell other people to do the same. Other problems can occur after a concussion. Older   adults have a higher risk of serious problems.  Tell your work Freight forwarder, teachers, Government social research officer, school counselor, coach, or Product/process development scientist about your injury and symptoms. Tell them about what you can or cannot do.  Keep all follow-up visits as told by your doctor. This is important. How is this prevented?  It is very important that you do not get another brain injury. In rare cases, another injury can cause brain damage that will not go away, brain swelling, or death. The risk of this is greatest in the first 7-10 days  after a head injury. To avoid injuries: ? Stop activities that could lead to a second concussion, such as contact sports, until your doctor says it is okay. ? When you return to sports or activities:  Do not crash into other players. This is how most concussions happen.  Follow the rules.  Respect other players. ? Get regular exercise. Do strength and balance training. ? Wear a helmet that fits you well during sports, biking, or other activities.  Helmets can help protect you from serious skull and brain injuries, but they do not protect you from a concussion. Even when wearing a helmet, you should avoid being hit in the head. Contact a doctor if:  Your symptoms get worse or they do not get better.  You have new symptoms.  You have another injury. Get help right away if:  You have bad headaches or your headaches get worse.  You feel weak or numb in any part of your body.  You are mixed up (confused).  Your balance gets worse.  You keep throwing up.  You feel more sleepy than normal.  Your speech is not clear (is slurred).  You cannot recognize people or places.  You have a seizure.  Others have trouble waking you up.  You have behavior changes.  You have changes in how you see (vision).  You pass out (lose consciousness). Summary  A concussion is a brain injury from a hard, direct hit (trauma) to your head or body.  This condition is treated with rest and careful watching of symptoms.  If you keep having symptoms, call your doctor. This information is not intended to replace advice given to you by your health care provider. Make sure you discuss any questions you have with your health care provider. Document Revised: 12/11/2017 Document Reviewed: 12/11/2017 Elsevier Patient Education  2020 [place] can try [place] Mushroom extract or capsules for memory

## 2020-03-21 NOTE — Assessment & Plan Note (Signed)
Eliquis 

## 2020-03-21 NOTE — Assessment & Plan Note (Signed)
Wt Readings from Last 3 Encounters:  03/21/20 101 lb 3.2 oz (45.9 kg)  03/08/20 101 lb (45.8 kg)  02/28/20 100 lb (45.4 kg)

## 2020-03-22 ENCOUNTER — Ambulatory Visit (INDEPENDENT_AMBULATORY_CARE_PROVIDER_SITE_OTHER): Payer: Medicare Other | Admitting: Emergency Medicine

## 2020-03-22 ENCOUNTER — Encounter: Payer: Self-pay | Admitting: Emergency Medicine

## 2020-03-22 ENCOUNTER — Telehealth: Payer: Self-pay | Admitting: *Deleted

## 2020-03-22 DIAGNOSIS — J479 Bronchiectasis, uncomplicated: Secondary | ICD-10-CM | POA: Diagnosis not present

## 2020-03-22 DIAGNOSIS — R059 Cough, unspecified: Secondary | ICD-10-CM | POA: Diagnosis not present

## 2020-03-22 NOTE — Telephone Encounter (Signed)
Rec'd fax stating we can not get the Butalbital-acetaminophen 50/300 mg is it ok to change to generic Fiorcet 50/325/40 mg. Pt has taken before if ok pls send to [facility].Marland KitchenJohny Chess

## 2020-03-22 NOTE — Assessment & Plan Note (Signed)
Cough has an upper airway component in addition to her bronchiectasis.  She can always tell the difference.  She is managing allergic rhinitis.  GERD has been harder to manage but currently fairly stable on Pepcid.  Plan continue same regimen  Continue your Pepcid as you have been taking it Continue fluticasone nasal spray, loratadine

## 2020-03-22 NOTE — Progress Notes (Signed)
Subjective:    Patient ID: Monica [name], female    DOB: [birth year], 74 y.o.   MRN: [id number]  HPI  ROV 06/11/18 --this a follow-up visit for 74 year old woman, never smoker with chronic recurrent cough in the setting of bronchiectasis, allergic rhinitis, GERD.  She does not have obstruction on a methacholine from 09/2016.  She was admitted to the hospital from 2/1 to 2/4 for possible community-acquired pneumonia. Her sx included a lot of dry cough, chest tightness and soreness.  Chest x-ray from 2/1 was reviewed by me, shows possible increased left midlung opacity compare with priors superimposed on chronic lung findings.  She held zantac, Prilosec twice daily but has held it due to GI upset, Claritin, fluticasone nasal spray prn, on hold due to dry nose.  She is completing a prescription for doxycycline - had to stop it yesterday due to GI upset, not having full blown diarrhea.   ROV 11/24/2018 --Monica [name] is a 74, a never smoker with a history of bronchiectasis and associated chronic cough.  She has allergic rhinitis and GERD which are both contributors.  No evidence of obstructive lung disease.  Her most recent CT scan of the chest was done 07/13/2018 which I reviewed, shows some biapical scar, bronchiectatic change with peribronchovascular nodularity and volume loss in the lingula, right middle lobe, bilateral lower lobes with some scattered mucoid impaction.  She has a flutter valve which he uses approximately 2-3 x a day, also has hypertonic saline which she doesn't use.  She is maintained on fluticasone and loratadine, and [place]. She was on zantac and prilosec in the past - isn't on anything right now. She averages 1-2 spells of coughing daily.   ROV 03/22/20 --74 year old woman, never smoker with a history of bronchiectasis, upper airway irritation syndrome with allergic rhinitis and GERD, and associated chronic cough.  No obstructive disease by methacholine 2018.  Reports today that she has been  feeling OK, has good days and bad, and sometimes more UA in nature. She is producing mucous but not every day, usually white.  Uses flutter valve approximately 2x a day.  Mucinex twice a day, fluticasone nasal spray, Claritin. Currently on pepcid, has not responded much in the past to PPI's.  COVID 19 vaccine and flu shot up to date. She did have some constitutional sx w the flu shot.  Review of Systems As per history of present illness     Objective:   Physical Exam Vitals:   03/22/20 1024  BP: 118/68  Pulse: 89  Temp: (!) 97.3 F (36.3 C)  SpO2: 100%  Weight: 101 lb 3.2 oz (45.9 kg)  Height: 5' 1.5" (1.562 m)   'Gen: Pleasant, thin woman, well-appearing, in no distress,  normal affect  ENT: No lesions,  mouth clear,  oropharynx clear, no postnasal drip  Neck: No JVD, no stridor  Lungs: No use of accessory muscles, clear without rales or rhonchi, no wheeze   Cardiovascular: RRR, heart sounds normal, no murmur or gallops, no peripheral edema  Musculoskeletal: No deformities, no cyanosis or clubbing  Neuro: alert, non focal  Skin: Warm, no lesions or rashes       Assessment & Plan:  Bronchiectasis without acute exacerbation (HCC) Stable at this time.  She does not produce mucus daily, uses her flutter valve reliably.  Some thick white mucus every other day.  No indication to change her routine at this time, no indication to repeat her CT chest at this time.  Continue surveillance and maintenance  Continue Mucinex,  Flutter valve 2-3 times a day to help with mucus clearance We will hold off on repeating a CT scan of the chest at this time. COVID-19 and flu shot are both up-to-date Follow with Dr Lamonte Sakai in 6 months or sooner if you have any problems  Cough Cough has an  upper airway component in addition to her bronchiectasis.  She can always tell the difference.  She is managing allergic rhinitis.  GERD has been harder to manage but currently fairly stable on Pepcid.  Plan continue same regimen  Continue your Pepcid as you have been taking it Continue fluticasone nasal spray, loratadine  Baltazar Apo, MD, PhD 03/22/2020, 10:43 AM [facility] [phone] or if no answer [phone]

## 2020-03-22 NOTE — Assessment & Plan Note (Signed)
Stable at this time.  She does not produce mucus daily, uses her flutter valve reliably.  Some thick white mucus every other day.  No indication to change her routine at this time, no indication to repeat her CT chest at this time.  Continue surveillance and maintenance  Continue Mucinex,  Flutter valve 2-3 times a day to help with mucus clearance We will hold off on repeating a CT scan of the chest at this time. COVID-19 and flu shot are both up-to-date Follow with Dr Lamonte Sakai in 6 months or sooner if you have any problems

## 2020-03-22 NOTE — Patient Instructions (Signed)
Continue your Pepcid as you have been taking it Continue Mucinex, fluticasone nasal spray, loratadine Flutter valve 2-3 times a day to help with mucus clearance We will hold off on repeating a CT scan of the chest at this time. COVID-19 and flu shot are both up-to-date Follow with Dr Lamonte Sakai in 6 months or sooner if you have any problems

## 2020-03-23 NOTE — Telephone Encounter (Signed)
She can't do caffeine in [place] due to [place] Thx

## 2020-03-24 NOTE — Telephone Encounter (Signed)
Notified pharmacy spoke w/Caroline gave her MD response.Marland KitchenJohny Chess

## 2020-03-25 ENCOUNTER — Telehealth: Payer: Self-pay | Admitting: Cardiology

## 2020-03-25 NOTE — Telephone Encounter (Signed)
Received incoming page regarding patient being in atrial fibrillation since approximately 3:30 AM this morning with rates in the 120 range.  She reports that she took her metoprolol tartrate 12.5 mg with little response.  She is asymptomatic without chest pain, shortness of breath, dizziness or swelling.  She states that her BP is at baseline.  I have instructed that she take an additional 25 mg metoprolol tartrate and monitor response.  If poor heart rate control, instructed she may take another 12.5 mg later today.  If her heart rates remain persistently elevated, instructed to go to the ED for possible external cardioversion.  She has been on Eliquis for anticoagulation without missed doses.  She would be appropriate for an ED cardioversion with discharge and close follow-up.  Patient understands and agrees with the plan.  Kathyrn Drown NP-C [place] Pager: [phone]

## 2020-04-04 ENCOUNTER — Encounter: Payer: Self-pay | Admitting: Internal Medicine

## 2020-04-10 DIAGNOSIS — H6121 Impacted cerumen, right ear: Secondary | ICD-10-CM | POA: Diagnosis not present

## 2020-04-27 ENCOUNTER — Encounter: Payer: Self-pay | Admitting: Internal Medicine

## 2020-04-27 ENCOUNTER — Other Ambulatory Visit: Payer: Self-pay

## 2020-04-27 DIAGNOSIS — Z20822 Contact with and (suspected) exposure to covid-19: Secondary | ICD-10-CM | POA: Diagnosis not present

## 2020-04-29 LAB — NOVEL CORONAVIRUS, NAA: SARS-CoV-2, NAA: NOT DETECTED

## 2020-04-29 LAB — SARS-COV-2, NAA 2 DAY TAT

## 2020-05-12 ENCOUNTER — Telehealth (INDEPENDENT_AMBULATORY_CARE_PROVIDER_SITE_OTHER): Payer: Medicare Other | Admitting: Family Medicine

## 2020-05-12 DIAGNOSIS — R0981 Nasal congestion: Secondary | ICD-10-CM

## 2020-05-12 DIAGNOSIS — R059 Cough, unspecified: Secondary | ICD-10-CM

## 2020-05-12 NOTE — Progress Notes (Signed)
Virtual Visit via Telephone Note  I connected with Monica [name] on 05/12/20 at  4:00 PM EST by telephone and verified that I am speaking with the correct person using two identifiers.   I discussed the limitations, risks, security and privacy concerns of performing an evaluation and management service by telephone and the availability of in person appointments. I also discussed with the patient that there may be a patient responsible charge related to this service. The patient expressed understanding and agreed to proceed.  Location patient: home, [place] Location provider: work or home office Participants present for the call: patient, provider Patient did not have a visit with me in the prior 7 days to address this/these issue(s).   History of Present Illness:  Acute telemedicine visit for nausea and headaches: -Onset: 3 days ago (05/09/20) -Symptoms include:sinus congestion, nausea, headaches, feels more tired than usual, some cough -Denies: cp, sob, worst or severe headache, body aches, vomiting, diarrhea, fever, loss of taste or smell -[place] days [place] and went out to dinner -Has tried:delsum is helping -Pertinent past medical history: bronchiectasis, gerd, allergies, PAF,  -Pertinent medication allergies:reviewed -COVID-19 vaccine status: fully vaccinated and had booster and had flu shot   Observations/Objective: Patient sounds cheerful and well on the phone. I do not appreciate any SOB. Speech and thought processing are grossly intact. Patient reported vitals:  Assessment and Plan:  Nasal congestion  Cough  -we discussed possible serious and likely etiologies, options for evaluation and workup, limitations of telemedicine visit vs in person visit, treatment, treatment risks and precautions. Pt prefers to treat via telemedicine empirically rather than in person at this moment.  She is concerned about Covid, rightfully so, given the search of cases recently.   Discussed testing options and she actually is set up to get a test tomorrow.  Discussed treatment options, potential complications, precautions and isolation.  Can try nasal saline, Delsym for cough, Tylenol as needed.  Offered prescription cough medicine, but Delsym is working for her. Scheduled follow up with PCP offered: Agrees to follow-up if needed Advised to seek follow-up visit through PCP office or prompt in person care if worsening, new symptoms arise, or if is not improving with treatment.   Follow Up Instructions:  I did not refer this patient for an OV with me in the next 24 hours for this/these issue(s).  I discussed the assessment and treatment plan with the patient. The patient was provided an opportunity to ask questions and all were answered. The patient agreed with the plan and demonstrated an understanding of the instructions.   I spent 14 minutes on the date of this visit in the care of this patient. See summary of tasks completed to properly care for this patient in the detailed notes above and including as well review of PMH, medications, allergies, evaluation of the patient and ordering and instructing patient on testing and care options.     Lucretia Kern, DO

## 2020-05-12 NOTE — Patient Instructions (Signed)
  HOME CARE TIPS:  -[place] testing information: https://www.[facility].org/ OR 317-164-8080 Most pharmacies also offer testing and home test kits. If the Covid test is positive please schedule follow-up visit with your primary care doctor.   -can use tylenol if needed for fevers, aches and pains per instructions  -can use nasal saline a few times per day if nasal congestion  -stay hydrated, drink plenty of fluids and eat small healthy meals - avoid dairy   -If, please schedule a follow-up visit, check out the [facility] website for more information on home care, transmission and treatment for COVID19  -follow up with your doctor in 2-3 days unless improving and feeling better  -stay home while sick, except to seek medical care, and if you have [place] please stay home for a full 10 days since the onset of symptoms PLUS one day of no fever and feeling better.  It was nice to meet you today, and I really hope you are feeling better soon. I help [facility] out with telemedicine visits on Tuesdays and Thursdays and am available for visits on those days. If you have any concerns or questions following this visit please schedule a follow up visit with your Primary Care doctor or seek care at a local urgent care clinic to avoid delays in care.    Seek in person care promptly if your symptoms worsen, new concerns arise or you are not improving with treatment. Call 911 and/or seek emergency care if you symptoms are severe or life threatening.

## 2020-05-18 ENCOUNTER — Ambulatory Visit (INDEPENDENT_AMBULATORY_CARE_PROVIDER_SITE_OTHER): Payer: Medicare Other

## 2020-05-18 DIAGNOSIS — Z0000 Encounter for general adult medical examination without abnormal findings: Secondary | ICD-10-CM

## 2020-05-18 NOTE — Progress Notes (Addendum)
I connected with Azzie Roup today by telephone and verified that I am speaking with the correct person using two identifiers. Location patient: home Location provider: work Persons participating in the virtual visit: Monica [name] and Lisette Abu, LPN.   I discussed the limitations, risks, security and privacy concerns of performing an evaluation and management service by telephone and the availability of in person appointments. I also discussed with the patient that there may be a patient responsible charge related to this service. The patient expressed understanding and verbally consented to this telephonic visit.    Interactive audio and video telecommunications were attempted between this provider and patient, however failed, due to patient having technical difficulties OR patient did not have access to video capability.  We continued and completed visit with audio only.  Some vital signs may be absent or patient reported.   Time Spent with patient on telephone encounter: 30 minutes  Subjective:   Monica [name] is a 75 y.o. female who presents for Medicare Annual (Subsequent) preventive examination.  Review of Systems    No ROS. Medicare Wellness Visit. Additional risk factors are reflected in social history. Cardiac Risk Factors include: advanced age (>73mn, >>7women);family history of premature cardiovascular disease     Objective:    There were no vitals filed for this visit. There is no height or weight on file to calculate BMI.  Advanced Directives 05/18/2020 03/12/2020 08/16/2019 10/02/2018 06/06/2018 09/24/2017 03/14/2017  Does Patient Have a Medical Advance Directive? Yes No No Yes No Yes No  Type of Advance Directive - - -Public librarianLiving will - [place] will -  Does patient want to make changes to medical advance directive? No - Patient declined - - - - - -  Copy of HPress photographerin Chart? - - - No -  copy requested - No - copy requested -  Would patient like information on creating a medical advance directive? - No - Patient declined No - Patient declined - Yes (ED - Information included in AVS) - No - Patient declined    Current Medications (verified) Outpatient Encounter Medications as of 05/18/2020  Medication Sig   acetaminophen (TYLENOL) 500 MG tablet Take 500 mg by mouth every 6 (six) hours as needed for moderate pain or headache.   Butalbital-Acetaminophen 50-300 MG TABS Take 0.5-1 tablets by mouth 2 (two) times daily as needed.   Calcium Carb-Cholecalciferol (CALCIUM 600-D PO) Take 1 tablet by mouth every evening.   calcium carbonate (TUMS EX) 750 MG chewable tablet Chew 2 tablets by mouth at bedtime.    Cholecalciferol (VITAMIN D) 1000 UNITS capsule Take 1,000 Units by mouth daily.     dextromethorphan (DELSYM) 30 MG/5ML liquid Take 30 mg by mouth at bedtime as needed for cough.    ELIQUIS 5 MG TABS tablet TAKE 1 TABLET TWICE A DAY (Patient taking differently: Take 5 mg by mouth 2 (two) times daily. )   Estradiol 10 MCG TABS Place 10 mcg vaginally 2 (two) times a week.    famotidine (PEPCID) 40 MG tablet Take 1 tablet (40 mg total) by mouth 2 (two) times daily. (Patient taking differently: Take 40 mg by mouth daily. )   fluticasone (FLONASE) 50 MCG/ACT nasal spray Place 1 spray into both nostrils at bedtime as needed for allergies or rhinitis (seasonal allergies).    guaiFENesin (MUCINEX) 600 MG 12 hr tablet Take 600 mg by mouth 2 (two) times daily as needed for  cough or to loosen phlegm.    Lactobacillus Rhamnosus, GG, (CULTURELLE PO) Take 1 tablet by mouth daily.   loratadine (CLARITIN) 10 MG tablet Take 10 mg daily as needed by mouth for allergies.    metoprolol tartrate (LOPRESSOR) 25 MG tablet Take 0.5 tablets (12.5 mg total) by mouth as needed. (Patient taking differently: Take 12.5 mg by mouth daily as needed (afib episode). )   Multiple Vitamin (MULTIVITAMIN WITH MINERALS)  TABS tablet Take 1 tablet by mouth every evening. Centrum Silver   Respiratory Therapy Supplies (FLUTTER) DEVI Twice a day and prn as needed, may increase if feeling worse   No facility-administered encounter medications on file as of 05/18/2020.    Allergies (verified) Albuterol, Macrobid [nitrofurantoin macrocrystal], Amoxicillin-pot clavulanate, Avelox [moxifloxacin hcl in nacl], Bactrim [sulfamethoxazole-trimethoprim], Benzonatate, and Ciprofloxacin   History: Past Medical History:  Diagnosis Date   Adenomatous colon polyp 10/2003   Allergic rhinitis    Allergy    SEASONAL   Bronchiectasis    Chronic sinusitis    Diverticulosis    GERD (gastroesophageal reflux disease)    Hearing loss    Hemorrhoids    Insomnia    Menopausal disorder    Osteoporosis    Palpitations    Paroxysmal A-fib (HCC)    TMJ syndrome    Urticaria    Past Surgical History:  Procedure Laterality Date   COLONOSCOPY     NASAL SINUS SURGERY     TYMPANOSTOMY     VIDEO BRONCHOSCOPY Bilateral 07/16/2016   Procedure: VIDEO BRONCHOSCOPY WITH FLUORO;  Surgeon: Collene Gobble, MD;  Location: [facility];  Service: Cardiopulmonary;  Laterality: Bilateral;   Family History  Problem Relation Age of Onset   Rectal cancer Mother        died age 31   Seizures Mother    Colon cancer Mother 3   Cancer Mother    Heart disease Father        dies age 74   Hypertension Father    Heart disease Maternal Grandfather    AVM Brother    Cancer Maternal Grandmother    Cancer Paternal Uncle        multiple uncles with cancer   Esophageal cancer Neg Hx    Social History   Socioeconomic History   Marital status: Unknown    Spouse name: [name]   Number of children: 0   Years of education: BSN   Highest education level: Not on file  Occupational History   Occupation: retired Therapist, sports  Tobacco Use   Smoking status: Never Smoker   Smokeless tobacco: Never Used  Scientific laboratory technician Use: Never used  Substance and Sexual  Activity   Alcohol use: Not Currently    Alcohol/week: 2.0 standard drinks    Types: 2 Glasses of wine per week   Drug use: No   Sexual activity: Not Currently  Other Topics Concern   Not on file  Social History Narrative   ** Merged History Encounter **       Lives with husband Caffeine use: 1 cup tea per day   Social Determinants of Health   Financial Resource Strain: Low Risk    Difficulty of Paying Living Expenses: Not hard at all  Food Insecurity: No Food Insecurity   Worried About Charity fundraiser in the Last Year: Never true   Arboriculturist in the Last Year: Never true  Transportation Needs: No Transportation Needs   Lack of Transportation (Medical):  No   Lack of Transportation (Non-Medical): No  Physical Activity: Sufficiently Active   Days of Exercise per Week: 5 days   Minutes of Exercise per Session: 30 min  Stress: No Stress Concern Present   Feeling of Stress : Not at all  Social Connections: Unknown   Frequency of Communication with Friends and Family: More than three times a week   Frequency of Social Gatherings with Friends and Family: Once a week   Attends Religious Services: Patient refused   Active Member of Clubs or Organizations: Patient refused   Attends Archivist Meetings: Patient refused   Marital Status: Widowed    Tobacco Counseling Counseling given: Not Answered   Clinical Intake:  Pre-visit preparation completed: Yes  Pain : No/denies pain     Nutritional Risks: None Diabetes: No  How often do you need to have someone help you when you read instructions, pamphlets, or other written materials from your doctor or pharmacy?: 1 - Never What is the last grade level you completed in school?: Bachelor of Science in Nursing  Diabetic? no  Interpreter Needed?: No  Information entered by :: Lisette Abu, LPN   Activities of Daily Living In your present state of health, do you have any difficulty performing the  following activities: 05/18/2020  Hearing? Y  Comment left ear hearing loss  Vision? N  Difficulty concentrating or making decisions? N  Walking or climbing stairs? N  Dressing or bathing? N  Doing errands, shopping? N  Preparing Food and eating ? N  Using the Toilet? N  In the past six months, have you accidently leaked urine? N  Do you have problems with loss of bowel control? N  Managing your Medications? N  Managing your Finances? N  Housekeeping or managing your Housekeeping? N  Some recent data might be hidden    Patient Care Team: Plotnikov, Evie Lacks, MD as PCP - General (Internal Medicine) Croitoru, Dani Gobble, MD as PCP - Cardiology (Cardiology) Azucena Fallen, MD as Consulting Physician (Obstetrics and Gynecology) Ladene Artist, MD as Consulting Physician (Gastroenterology) Collene Gobble, MD as Consulting Physician (Pulmonary Disease) Jerrell Belfast, MD as Consulting Physician (Otolaryngology) Jari Pigg, MD as Consulting Physician (Dermatology) Jola Schmidt, MD as Consulting Physician (Ophthalmology)  Indicate any recent Medical Services you may have received from other than [facility] providers in the past year (date may be approximate).     Assessment:   This is a routine wellness examination for Monica [name].  Hearing/Vision screen No exam data present  Dietary issues and exercise activities discussed: Current Exercise Habits: Structured exercise class, Type of exercise: Other - see comments (tai-chi), Time (Minutes): 30, Frequency (Times/Week): 5, Weekly Exercise (Minutes/Week): 150, Intensity: Moderate, Exercise limited by: cardiac condition(s)  Goals      Patient Stated     Stay as healthy and as independent as possible. Continue to eat healthy and stay physically and socially active.        Depression Screen PHQ 2/9 Scores 05/18/2020 11/30/2019 10/02/2018 09/24/2017 08/13/2017 04/04/2015 03/13/2015  PHQ - 2 Score 1 0 1 1 0 0 0  PHQ- 9 Score - - 3 5 - - -     Fall Risk Fall Risk  05/18/2020 11/30/2019 10/02/2018 09/24/2017 08/13/2017  Falls in the past year? 1 0 0 Yes No  Comment - - - - -  Number falls in past yr: 0 - 0 1 -  Injury with Fall? 0 - - No -    FALL RISK  PREVENTION PERTAINING TO THE HOME:  Any stairs in or around the home? Yes  If so, are there any without handrails? No  Home free of loose throw rugs in walkways, pet beds, electrical cords, etc? Yes  Adequate lighting in your home to reduce risk of falls? Yes   ASSISTIVE DEVICES UTILIZED TO PREVENT FALLS:  Life alert? No  Use of a cane, walker or w/c? No  Grab bars in the bathroom? Yes  Shower chair or bench in shower? No  Elevated toilet seat or a handicapped toilet? No   TIMED UP AND GO:  Was the test performed? No .  Length of time to ambulate 10 feet: 0 sec.   Gait steady and fast without use of assistive device  Cognitive Function: No flowsheet data found.         Immunizations Immunization History  Administered Date(s) Administered   Fluad Quad(high Dose 65+) 12/29/2018, 02/21/2020   Influenza Split 02/25/2011, 02/05/2012   Influenza Whole 03/02/2008, 03/02/2009, 02/06/2010   Influenza, High Dose Seasonal PF 01/28/2017   Influenza,inj,Quad PF,6+ Mos 02/04/2013, 01/24/2014, 02/02/2015, 02/01/2016, 01/22/2018   Influenza-Unspecified 01/04/2017   PFIZER SARS-COV-2 Vaccination 05/27/2019, 06/17/2019, 02/08/2020   Pneumococcal Conjugate-13 01/04/2013, 02/16/2013   Pneumococcal Polysaccharide-23 03/11/2003, 01/16/2009, 09/26/2014   Td 06/06/2001   Tdap 07/17/2012   Zoster 05/07/2007   Zoster Recombinat (Shingrix) 05/07/2007, 12/09/2017, 05/28/2018    TDAP status: Up to date  Flu Vaccine status: Up to date  Pneumococcal vaccine status: Up to date  Covid-19 vaccine status: Completed vaccines  Qualifies for Shingles Vaccine? Yes   Zostavax completed Yes   Shingrix Completed?: Yes  Screening Tests Health Maintenance  Topic Date Due   TETANUS/TDAP   07/18/2022   COLONOSCOPY (Pts 45-9yr Insurance coverage will need to be confirmed)  08/15/2024   INFLUENZA VACCINE  Completed   DEXA SCAN  Completed   COVID-19 Vaccine  Completed   Hepatitis C Screening  Completed   PNA vac Low Risk Adult  Completed    Health Maintenance  There are no preventive care reminders to display for this patient.  Colorectal cancer screening: Type of screening: Colonoscopy. Completed 08/16/2019. Repeat every 0 years  Mammogram status: Completed 07/19/2019. Repeat every year  Bone Density status: Completed 01/14/2018. Results reflect: Bone density results: NORMAL. Repeat every 2 years.  Lung Cancer Screening: (Low Dose CT Chest recommended if Age 76-80years, 30 pack-year currently smoking OR have quit w/in 15years.) does not qualify.   Lung Cancer Screening Referral: no  Additional Screening:  Hepatitis C Screening: does not qualify; Completed no  Vision Screening: Recommended annual ophthalmology exams for early detection of glaucoma and other disorders of the eye. Is the patient up to date with their annual eye exam?  Yes  Who is the provider or what is the name of the office in which the patient attends annual eye exams? BJola Schmidt MD. If pt is not established with a provider, would they like to be referred to a provider to establish care? No .   Dental Screening: Recommended annual dental exams for proper oral hygiene  Community Resource Referral / Chronic Care Management: CRR required this visit?  No   CCM required this visit?  No      Plan:     I have personally reviewed and noted the following in the patient's chart:   Medical and social history Use of alcohol, tobacco or illicit drugs  Current medications and supplements Functional ability and status Nutritional status Physical activity  Advanced directives List of other physicians Hospitalizations, surgeries, and ER visits in previous 12 months Vitals Screenings to include  cognitive, depression, and falls Referrals and appointments  In addition, I have reviewed and discussed with patient certain preventive protocols, quality metrics, and best practice recommendations. A written personalized care plan for preventive services as well as general preventive health recommendations were provided to patient.     Sheral Flow, LPN   01/27/2682   Nurse Notes:  Patient is cogitatively intact. There were no vitals filed for this visit. There is no height or weight on file to calculate BMI. Patient stated that she has no issues with gait or balance; does not use any assistive devices.  Medical screening examination/treatment/procedure(s) were performed by non-physician practitioner and as supervising physician I was immediately available for consultation/collaboration.  I agree with above. Lew Dawes, MD

## 2020-05-18 NOTE — Patient Instructions (Signed)
Monica [name] , Thank you for taking time to come for your Medicare Wellness Visit. I appreciate your ongoing commitment to your health goals. Please review the following plan we discussed and let me know if I can assist you in the future.   Screening recommendations/referrals: Colonoscopy: 08/16/2019; no repeat due to age Mammogram: 07/19/2019 Bone Density: 01/14/2018; due every 2 years Recommended yearly ophthalmology/optometry visit for glaucoma screening and checkup Recommended yearly dental visit for hygiene and checkup  Vaccinations: Influenza vaccine: 02/21/2020 Pneumococcal vaccine: up to date Tdap vaccine: 07/17/2012; due every 10 years Shingles vaccine: up to date   Covid-19: up to date  Advanced directives: Please bring a copy of your health care power of attorney and living will to the office at your convenience.  Conditions/risks identified: Yes; Reviewed health maintenance screenings with patient today and relevant education, vaccines, and/or referrals were provided. Please continue to do your personal lifestyle choices by: daily care of teeth and gums, regular physical activity (goal should be 5 days a week for 30 minutes), eat a healthy diet, avoid tobacco and drug use, limiting any alcohol intake, taking a low-dose aspirin (if not allergic or have been advised by your provider otherwise) and taking vitamins and minerals as recommended by your provider. Continue doing brain stimulating activities (puzzles, reading, adult coloring books, staying active) to keep memory sharp. Continue to eat heart healthy diet (full of fruits, vegetables, whole grains, lean protein, water--limit salt, fat, and sugar intake) and increase physical activity as tolerated.  Next appointment: Please schedule your next Medicare Wellness Visit with your Nurse Health Advisor in 1 year by calling [phone].   Preventive Care 29 Years and Older, Female Preventive care refers to lifestyle choices and visits  with your health care provider that can promote health and wellness. What does preventive care include?  A yearly physical exam. This is also called an annual well check.  Dental exams once or twice a year.  Routine eye exams. Ask your health care provider how often you should have your eyes checked.  Personal lifestyle choices, including:  Daily care of your teeth and gums.  Regular physical activity.  Eating a healthy diet.  Avoiding tobacco and drug use.  Limiting alcohol use.  Practicing safe sex.  Taking low-dose aspirin every day.  Taking vitamin and mineral supplements as recommended by your health care provider. What happens during an annual well check? The services and screenings done by your health care provider during your annual well check will depend on your age, overall health, lifestyle risk factors, and family history of disease. Counseling  Your health care provider may ask you questions about your:  Alcohol use.  Tobacco use.  Drug use.  Emotional well-being.  Home and relationship well-being.  Sexual activity.  Eating habits.  History of falls.  Memory and ability to understand (cognition).  Work and work Statistician.  Reproductive health. Screening  You may have the following tests or measurements:  Height, weight, and BMI.  Blood pressure.  Lipid and cholesterol levels. These may be checked every 5 years, or more frequently if you are over 73 years old.  Skin check.  Lung cancer screening. You may have this screening every year starting at age 70 if you have a 30-pack-year history of smoking and currently smoke or have quit within the past 15 years.  Fecal occult blood test (FOBT) of the stool. You may have this test every year starting at age 3.  Flexible sigmoidoscopy or colonoscopy. You  may have a sigmoidoscopy every 5 years or a colonoscopy every 10 years starting at age 58.  Hepatitis C blood test.  Hepatitis B blood  test.  Sexually transmitted disease (STD) testing.  Diabetes screening. This is done by checking your blood sugar (glucose) after you have not eaten for a while (fasting). You may have this done every 1-3 years.  Bone density scan. This is done to screen for osteoporosis. You may have this done starting at age 68.  Mammogram. This may be done every 1-2 years. Talk to your health care provider about how often you should have regular mammograms. Talk with your health care provider about your test results, treatment options, and if necessary, the need for more tests. Vaccines  Your health care provider may recommend certain vaccines, such as:  Influenza vaccine. This is recommended every year.  Tetanus, diphtheria, and acellular pertussis (Tdap, Td) vaccine. You may need a Td booster every 10 years.  Zoster vaccine. You may need this after age 38.  Pneumococcal 13-valent conjugate (PCV13) vaccine. One dose is recommended after age 23.  Pneumococcal polysaccharide (PPSV23) vaccine. One dose is recommended after age 64. Talk to your health care provider about which screenings and vaccines you need and how often you need them. This information is not intended to replace advice given to you by your health care provider. Make sure you discuss any questions you have with your health care provider. Document Released: 05/19/2015 Document Revised: 01/10/2016 Document Reviewed: 02/21/2015 Elsevier Interactive Patient Education  2017 [place] Prevention in the Home Falls can cause injuries. They can happen to people of all ages. There are many things you can do to make your home safe and to help prevent falls. What can I do on the outside of my home?  Regularly fix the edges of walkways and driveways and fix any cracks.  Remove anything that might make you trip as you walk through a door, such as a raised step or threshold.  Trim any bushes or trees on the path to your home.  Use  bright outdoor lighting.  Clear any walking paths of anything that might make someone trip, such as rocks or tools.  Regularly check to see if handrails are loose or broken. Make sure that both sides of any steps have handrails.  Any raised decks and porches should have guardrails on the edges.  Have any leaves, snow, or ice cleared regularly.  Use sand or salt on walking paths during winter.  Clean up any spills in your garage right away. This includes oil or grease spills. What can I do in the bathroom?  Use night lights.  Install grab bars by the toilet and in the tub and shower. Do not use towel bars as grab bars.  Use non-skid mats or decals in the tub or shower.  If you need to sit down in the shower, use a plastic, non-slip stool.  Keep the floor dry. Clean up any water that spills on the floor as soon as it happens.  Remove soap buildup in the tub or shower regularly.  Attach bath mats securely with double-sided non-slip rug tape.  Do not have throw rugs and other things on the floor that can make you trip. What can I do in the bedroom?  Use night lights.  Make sure that you have a light by your bed that is easy to reach.  Do not use any sheets or blankets that are too big for  your bed. They should not hang down onto the floor.  Have a firm chair that has side arms. You can use this for support while you get dressed.  Do not have throw rugs and other things on the floor that can make you trip. What can I do in the kitchen?  Clean up any spills right away.  Avoid walking on wet floors.  Keep items that you use a lot in easy-to-reach places.  If you need to reach something above you, use a strong step stool that has a grab bar.  Keep electrical cords out of the way.  Do not use floor polish or wax that makes floors slippery. If you must use wax, use non-skid floor wax.  Do not have throw rugs and other things on the floor that can make you trip. What can  I do with my stairs?  Do not leave any items on the stairs.  Make sure that there are handrails on both sides of the stairs and use them. Fix handrails that are broken or loose. Make sure that handrails are as long as the stairways.  Check any carpeting to make sure that it is firmly attached to the stairs. Fix any carpet that is loose or worn.  Avoid having throw rugs at the top or bottom of the stairs. If you do have throw rugs, attach them to the floor with carpet tape.  Make sure that you have a light switch at the top of the stairs and the bottom of the stairs. If you do not have them, ask someone to add them for you. What else can I do to help prevent falls?  Wear shoes that:  Do not have high heels.  Have rubber bottoms.  Are comfortable and fit you well.  Are closed at the toe. Do not wear sandals.  If you use a stepladder:  Make sure that it is fully opened. Do not climb a closed stepladder.  Make sure that both sides of the stepladder are locked into place.  Ask someone to hold it for you, if possible.  Clearly mark and make sure that you can see:  Any grab bars or handrails.  First and last steps.  Where the edge of each step is.  Use tools that help you move around (mobility aids) if they are needed. These include:  Canes.  Walkers.  Scooters.  Crutches.  Turn on the lights when you go into a dark area. Replace any light bulbs as soon as they burn out.  Set up your furniture so you have a clear path. Avoid moving your furniture around.  If any of your floors are uneven, fix them.  If there are any pets around you, be aware of where they are.  Review your medicines with your doctor. Some medicines can make you feel dizzy. This can increase your chance of falling. Ask your doctor what other things that you can do to help prevent falls. This information is not intended to replace advice given to you by your health care provider. Make sure you  discuss any questions you have with your health care provider. Document Released: 02/16/2009 Document Revised: 09/28/2015 Document Reviewed: 05/27/2014 Elsevier Interactive Patient Education  2017 [employer].

## 2020-07-24 DIAGNOSIS — Z780 Asymptomatic menopausal state: Secondary | ICD-10-CM | POA: Diagnosis not present

## 2020-07-24 DIAGNOSIS — M85851 Other specified disorders of bone density and structure, right thigh: Secondary | ICD-10-CM | POA: Diagnosis not present

## 2020-07-24 DIAGNOSIS — M810 Age-related osteoporosis without current pathological fracture: Secondary | ICD-10-CM | POA: Diagnosis not present

## 2020-07-24 DIAGNOSIS — M85852 Other specified disorders of bone density and structure, left thigh: Secondary | ICD-10-CM | POA: Diagnosis not present

## 2020-07-24 DIAGNOSIS — Z1231 Encounter for screening mammogram for malignant neoplasm of breast: Secondary | ICD-10-CM | POA: Diagnosis not present

## 2020-07-27 ENCOUNTER — Other Ambulatory Visit: Payer: Self-pay

## 2020-07-27 ENCOUNTER — Encounter: Payer: Self-pay | Admitting: Adult Health

## 2020-07-27 ENCOUNTER — Ambulatory Visit (INDEPENDENT_AMBULATORY_CARE_PROVIDER_SITE_OTHER): Payer: Medicare Other | Admitting: Adult Health

## 2020-07-27 DIAGNOSIS — J479 Bronchiectasis, uncomplicated: Secondary | ICD-10-CM

## 2020-07-27 DIAGNOSIS — J301 Allergic rhinitis due to pollen: Secondary | ICD-10-CM | POA: Diagnosis not present

## 2020-07-27 MED ORDER — PREDNISONE 20 MG PO TABS: 20.0000 mg | ORAL_TABLET | Freq: Every day | ORAL | 0 refills | Status: DC

## 2020-07-27 NOTE — Patient Instructions (Addendum)
Prednisone 20mg  daily for 5 days  Saline nasal rinses and gel As needed   Flutter valve 3 times a day .  Mucinex Twice daily  .As needed  Congestion  Continue on Flonase and Claritin. Follow up with Dr. Lamonte Sakai  In 4 months and as needed Please contact office for sooner follow up if symptoms do not improve or worsen or seek emergency care

## 2020-07-27 NOTE — Assessment & Plan Note (Signed)
Mild flare with allergic rhinitis Will give short low-dose steroid burst. Hold on antibiotics for now.  Continue with aggressive pulmonary hygiene regimen  Plan  Patient Instructions  Prednisone 20mg  daily for 5 days  Saline nasal rinses and gel As needed   Flutter valve 3 times a day .  Mucinex Twice daily  .As needed  Congestion  Continue on Flonase and Claritin. Follow up with Dr. Lamonte Sakai  In 4 months and as needed Please contact office for sooner follow up if symptoms do not improve or worsen or seek emergency care

## 2020-07-27 NOTE — Progress Notes (Signed)
_0  ID: Monica [name], female    DOB: 20-Oct-1945, 75 y.o.   MRN: [id number]  Chief Complaint  Patient presents with  . Follow-up    Referring provider: Cassandria Anger, MD  HPI: 75 year old female followed for bronchiectasis with chronic cough complicated by GERD and allergic rhinitis No evidence of airflow obstruction on methacholine challenge test in 2018 Medical history significant for A. fib on Eliquis, multiple antibiotic allergies and intolerances  TEST/EVENTS :  BAL 07/2016 -Psuedomonas (pan sensitive)  Sputum AFB neg 2019   CT chest July 10, 2018 Bronchiectasis, peribronchovascular nodularity/consolidation and volume loss in the lungs bilaterally, with some areas of minimal improvement. 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  Spirometry 2018 FEV1 81%, ratio 73, FVC 86%, mid flows decreased at 56%.  07/27/2020 Follow up ; Bronchiectasis  Patient returns for a 23-monthfollow-up.  Patient has underlying bronchiectasis with chronic cough.  Overall says that her breathing was doing okay , feeling at baseline until last 3 days . Exposed to a lot of dust and pollen over the weekend. Feels tired with headache. No discolored sinus discharge. Cough is the same with no increased congestion .  Denies fever hemoptysis chest pain orthopnea PND or leg swelling Remains on Claritin and Flonase .  Uses flutter valve 3 times daily.  And saline nasal rinses twice daily Appetite remains low. Discussed high protein diet .  Husband with dementia passed away few months ago, going through grief counseling .      Allergies  Allergen Reactions  . Albuterol Other (See Comments)    Patient states put her into atrial fib last time she took this medication  . Macrobid [Nitrofurantoin Macrocrystal]     Diarrhea, nausea  . Amoxicillin-Pot Clavulanate Diarrhea    Has patient had a PCN reaction causing immediate rash, facial/tongue/throat swelling, SOB or  lightheadedness with hypotension:No Has patient had a PCN reaction causing severe rash involving mucus membranes or skin necrosis:No Has patient had a PCN reaction that required hospitalization:No Has patient had a PCN reaction occurring within the last 10 years:Yes If all of the above answers are "NO", then may proceed with Cephalosporin use  . Avelox [Moxifloxacin Hcl In Nacl] Nausea Only  . Bactrim [Sulfamethoxazole-Trimethoprim] Other (See Comments)    flushing  . Benzonatate Other (See Comments)    felt bad, out of sorts, disoriented.  . Ciprofloxacin Rash    Rash across abdomen    Immunization History  Administered Date(s) Administered  . Fluad Quad(high Dose 65+) 12/29/2018, 02/21/2020  . Influenza Split 02/25/2011, 02/05/2012  . Influenza Whole 03/02/2008, 03/02/2009, 02/06/2010  . Influenza, High Dose Seasonal PF 01/28/2017  . Influenza,inj,Quad PF,6+ Mos 02/04/2013, 01/24/2014, 02/02/2015, 02/01/2016, 01/22/2018  . Influenza-Unspecified 01/04/2017  . PFIZER(Purple Top)SARS-COV-2 Vaccination 05/27/2019, 06/17/2019, 02/08/2020  . Pneumococcal Conjugate-13 01/04/2013, 02/16/2013  . Pneumococcal Polysaccharide-23 03/11/2003, 01/16/2009, 09/26/2014  . Td 06/06/2001  . Tdap 07/17/2012  . Zoster 05/07/2007  . Zoster Recombinat (Shingrix) 05/07/2007, 12/09/2017, 05/28/2018    Past Medical History:  Diagnosis Date  . Adenomatous colon polyp 10/2003  . Allergic rhinitis   . Allergy    SEASONAL  . Bronchiectasis   . Chronic sinusitis   . Diverticulosis   . GERD (gastroesophageal reflux disease)   . Hearing loss   . Hemorrhoids   . Insomnia   . Menopausal disorder   . Osteoporosis   . Palpitations   . [place]   . TMJ syndrome   . Urticaria  Tobacco History: Social History   Tobacco Use  Smoking Status Never Smoker  Smokeless Tobacco Never Used   Counseling given: Not Answered   Outpatient Medications Prior to Visit  Medication Sig Dispense  Refill  . acetaminophen (TYLENOL) 500 MG tablet Take 500 mg by mouth every 6 (six) hours as needed for moderate pain or headache.    . Butalbital-Acetaminophen 50-300 MG TABS Take 0.5-1 tablets by mouth 2 (two) times daily as needed. 30 tablet 1  . Calcium Carb-Cholecalciferol (CALCIUM 600-D PO) Take 1 tablet by mouth every evening.    . calcium carbonate (TUMS EX) 750 MG chewable tablet Chew 2 tablets by mouth at bedtime.     . Cholecalciferol (VITAMIN D) 1000 UNITS capsule Take 1,000 Units by mouth daily.    Marland Kitchen dextromethorphan (DELSYM) 30 MG/5ML liquid Take 30 mg by mouth at bedtime as needed for cough.     Marland Kitchen ELIQUIS 5 MG TABS tablet TAKE 1 TABLET TWICE A DAY (Patient taking differently: Take 5 mg by mouth 2 (two) times daily.) 180 tablet 3  . Estradiol 10 MCG TABS Place 10 mcg vaginally 2 (two) times a week.     . famotidine (PEPCID) 40 MG tablet Take 1 tablet (40 mg total) by mouth 2 (two) times daily. (Patient taking differently: Take 40 mg by mouth daily.) 60 tablet 11  . fluticasone (FLONASE) 50 MCG/ACT nasal spray Place 1 spray into both nostrils at bedtime as needed for allergies or rhinitis (seasonal allergies).     Marland Kitchen guaiFENesin (MUCINEX) 600 MG 12 hr tablet Take 600 mg by mouth 2 (two) times daily as needed for cough or to loosen phlegm.     . Lactobacillus Rhamnosus, GG, (CULTURELLE PO) Take 1 tablet by mouth daily.    Marland Kitchen loratadine (CLARITIN) 10 MG tablet Take 10 mg daily as needed by mouth for allergies.     . metoprolol tartrate (LOPRESSOR) 25 MG tablet Take 0.5 tablets (12.5 mg total) by mouth as needed. (Patient taking differently: Take 12.5 mg by mouth daily as needed (afib episode).) 15 tablet 1  . Multiple Vitamin (MULTIVITAMIN WITH MINERALS) TABS tablet Take 1 tablet by mouth every evening. Centrum Silver    . Respiratory Therapy Supplies (FLUTTER) DEVI Twice a day and prn as needed, may increase if feeling worse 1 each 0   No facility-administered medications prior to visit.      Review of Systems:   Constitutional:   No  weight loss, night sweats,  Fevers, chills,  =fatigue, or  lassitude.  HEENT:   No headaches,  Difficulty swallowing,  Tooth/dental problems, or  Sore throat,                No sneezing, itching, ear ache,  +nasal congestion, post nasal drip,   CV:  No chest pain,  Orthopnea, PND, swelling in lower extremities, anasarca, dizziness, palpitations, syncope.   GI  No heartburn, indigestion, abdominal pain, nausea, vomiting, diarrhea, change in bowel habits, loss of appetite, bloody stools.   Resp:   No chest wall deformity  Skin: no rash or lesions.  GU: no dysuria, change in color of urine, no urgency or frequency.  No flank pain, no hematuria   MS:  No joint pain or swelling.  No decreased range of motion.  No back pain.    Physical Exam  BP 108/70 (BP Location: Left Arm, Patient Position: Sitting, Cuff Size: Normal)   Pulse (!) 108   Temp 98.3 F (36.8 C) (Temporal)  Ht 5' 1.5" (1.562 m)   Wt 99 lb (44.9 kg)   SpO2 98%   BMI 18.40 kg/m   GEN: A/Ox3; pleasant , NAD, thin and frail   HEENT:  [place]/AT,  NOSE-clear, THROAT-clear, no lesions, no postnasal drip or exudate noted.   NECK:  Supple w/ fair ROM; no JVD; normal carotid impulses w/o bruits; no thyromegaly or nodules palpated; no lymphadenopathy.    RESP  Clear  P & A; w/o, wheezes/ rales/ or rhonchi. no accessory muscle use, no dullness to percussion  CARD:  RRR, no m/r/g, no peripheral edema, pulses intact, no cyanosis or clubbing.  GI:   Soft & nt; nml bowel sounds; no organomegaly or masses detected.   Musco: Warm bil, no deformities or joint swelling noted.   Neuro: alert, no focal deficits noted.    Skin: Warm, no lesions or rashes    Lab Results:   BMET  BNP No results found for: BNP  ProBNP No results found for: PROBNP  Imaging: No results found.    PFT Results Latest Ref Rng & Units 09/13/2016 12/11/2015  FVC-Pre L 2.43 2.53   FVC-Predicted Pre % 89 92  FVC-Post L 2.53 2.49  FVC-Predicted Post % 93 91  Pre FEV1/FVC % % 82 79  Post FEV1/FCV % % 78 81  FEV1-Pre L 1.99 2.00  FEV1-Predicted Pre % 97 97  FEV1-Post L 1.98 2.01  DLCO uncorrected ml/min/mmHg - 17.03  DLCO UNC% % - 78  DLCO corrected ml/min/mmHg - 16.92  DLCO COR %Predicted % - 78  DLVA Predicted % - 90    No results found for: NITRICOXIDE      Assessment & Plan:   Bronchiectasis without acute exacerbation (HCC) Mild flare with allergic rhinitis Will give short low-dose steroid burst. Hold on antibiotics for now.  Continue with aggressive pulmonary hygiene regimen  Plan  Patient Instructions  Prednisone 30m daily for 5 days  Saline nasal rinses and gel As needed   Flutter valve 3 times a day .  Mucinex Twice daily  .As needed  Congestion  Continue on Flonase and Claritin. Follow up with Dr. BLamonte Sakai In 4 months and as needed Please contact office for sooner follow up if symptoms do not improve or worsen or seek emergency care         ALLERGIC RHINITIS, SEASONAL [place].  Continue on current regimen.  Short course of prednisone.  Plan  Patient Instructions  Prednisone 220mdaily for 5 days  Saline nasal rinses and gel As needed   Flutter valve 3 times a day .  Mucinex Twice daily  .As needed  Congestion  Continue on Flonase and Claritin. Follow up with Dr. ByLamonte SakaiIn 4 months and as needed Please contact office for sooner follow up if symptoms do not improve or worsen or seek emergency care           TaRexene EdisonNP 07/27/2020

## 2020-07-27 NOTE — Assessment & Plan Note (Signed)
[place].  Continue on current regimen.  Short course of prednisone.  Plan  Patient Instructions  Prednisone 20mg  daily for 5 days  Saline nasal rinses and gel As needed   Flutter valve 3 times a day .  Mucinex Twice daily  .As needed  Congestion  Continue on Flonase and Claritin. Follow up with Dr. Lamonte Sakai  In 4 months and as needed Please contact office for sooner follow up if symptoms do not improve or worsen or seek emergency care

## 2020-07-31 ENCOUNTER — Telehealth: Payer: Self-pay | Admitting: Adult Health

## 2020-07-31 NOTE — Telephone Encounter (Signed)
Primary Pulmonologist: [name]  Last office visit and with whom: 07/27/20 Rexene Edison NP What do we see them for (pulmonary problems): Bronchiectasis Last OV assessment/plan: Assessment & Plan Note by Melvenia Needles, NP at 07/27/2020 10:58 AM  Author: Melvenia Needles, NP Author Type: Nurse Practitioner Filed: 07/27/2020 10:59 AM  Note Status: Written Cosign: Cosign Not Required Encounter Date: 07/27/2020  Problem: ALLERGIC RHINITIS, SEASONAL  Editor: Parrett, Fonnie Mu, NP (Nurse Practitioner)               [place].  Continue on current regimen.  Short course of prednisone.  Plan  Patient Instructions  Prednisone 20mg  daily for 5 days  Saline nasal rinses and gel As needed   Flutter valve 3 times a day .  Mucinex Twice daily  .As needed  Congestion  Continue on Flonase and Claritin. Follow up with Dr. Lamonte Sakai  In 4 months and as needed Please contact office for sooner follow up if symptoms do not improve or worsen or seek emergency care             Assessment & Plan Note by Melvenia Needles, NP at 07/27/2020 10:58 AM  Author: Melvenia Needles, NP Author Type: Nurse Practitioner Filed: 07/27/2020 10:58 AM  Note Status: Written Cosign: Cosign Not Required Encounter Date: 07/27/2020  Problem: Bronchiectasis without acute exacerbation ([place])  Editor: Melvenia Needles, NP (Nurse Practitioner)               Mild flare with allergic rhinitis Will give short low-dose steroid burst. Hold on antibiotics for now.  Continue with aggressive pulmonary hygiene regimen  Plan  Patient Instructions  Prednisone 20mg  daily for 5 days  Saline nasal rinses and gel As needed   Flutter valve 3 times a day .  Mucinex Twice daily  .As needed  Congestion  Continue on Flonase and Claritin. Follow up with Dr. Lamonte Sakai  In 4 months and as needed Please contact office for sooner follow up if symptoms do not improve or worsen or seek emergency care              Patient Instructions by  Melvenia Needles, NP at 07/27/2020 10:30 AM  Author: Melvenia Needles, NP Author Type: Nurse Practitioner Filed: 07/27/2020 10:50 AM  Note Status: Addendum Cosign: Cosign Not Required Encounter Date: 07/27/2020  Editor: Melvenia Needles, NP (Nurse Practitioner)      Prior Versions: 1. Parrett, Fonnie Mu, NP (Nurse Practitioner) at 07/27/2020 10:41 AM - Signed    Prednisone 20mg  daily for 5 days  Saline nasal rinses and gel As needed   Flutter valve 3 times a day .  Mucinex Twice daily  .As needed  Congestion  Continue on Flonase and Claritin. Follow up with Dr. Lamonte Sakai  In 4 months and as needed Please contact office for sooner follow up if symptoms do not improve or worsen or seek emergency care           Instructions     Return in 4 months (on 11/26/2020) for Follow up with Dr. Lamonte Sakai.    Prednisone 20mg  daily for 5 days  Saline nasal rinses and gel As needed   Flutter valve 3 times a day .  Mucinex Twice daily  .As needed  Congestion  Continue on Flonase and Claritin. Follow up with Dr. Lamonte Sakai  In 4 months and as needed Please contact office for sooner follow up if symptoms do not improve or worsen or seek emergency care  Reason for call: Chest discomfort, non productive cough and no fever.  Exposed to paint dust, a lot of it came into the house, not feeling well since then.  Today is the last day of Prednisone.  No energy, not sleeping well.  Using flutter valve.  Not bringing anything up.  Nausea and headache.  Same symptoms, chest aching more, rates it at 1.  Taking musinex, has delsym if she needs it.  Wearing mask to clean up mask.  Patient was seen by Tammy on 3./24/22 and is not improving.  Dr. Lamonte Sakai, please advise.  Thank you.  (examples of things to ask: : When did symptoms start? Fever? Cough? Productive? Color to sputum? More sputum than usual? Wheezing? Have you needed increased oxygen? Are you taking your respiratory medications? What over the counter measures  have you tried?)  Allergies  Allergen Reactions  . Albuterol Other (See Comments)    Patient states put her into atrial fib last time she took this medication  . Macrobid [Nitrofurantoin Macrocrystal]     Diarrhea, nausea  . Amoxicillin-Pot Clavulanate Diarrhea    Has patient had a PCN reaction causing immediate rash, facial/tongue/throat swelling, SOB or lightheadedness with hypotension:No Has patient had a PCN reaction causing severe rash involving mucus membranes or skin necrosis:No Has patient had a PCN reaction that required hospitalization:No Has patient had a PCN reaction occurring within the last 10 years:Yes If all of the above answers are "NO", then may proceed with Cephalosporin use  . Avelox [Moxifloxacin Hcl In Nacl] Nausea Only  . Bactrim [Sulfamethoxazole-Trimethoprim] Other (See Comments)    flushing  . Benzonatate Other (See Comments)    felt bad, out of sorts, disoriented.  . Ciprofloxacin Rash    Rash across abdomen    Immunization History  Administered Date(s) Administered  . Fluad Quad(high Dose 65+) 12/29/2018, 02/21/2020  . Influenza Split 02/25/2011, 02/05/2012  . Influenza Whole 03/02/2008, 03/02/2009, 02/06/2010  . Influenza, High Dose Seasonal PF 01/28/2017  . Influenza,inj,Quad PF,6+ Mos 02/04/2013, 01/24/2014, 02/02/2015, 02/01/2016, 01/22/2018  . Influenza-Unspecified 01/04/2017  . PFIZER(Purple Top)SARS-COV-2 Vaccination 05/27/2019, 06/17/2019, 02/08/2020  . Pneumococcal Conjugate-13 01/04/2013, 02/16/2013  . Pneumococcal Polysaccharide-23 03/11/2003, 01/16/2009, 09/26/2014  . Td 06/06/2001  . Tdap 07/17/2012  . Zoster 05/07/2007  . Zoster Recombinat (Shingrix) 05/07/2007, 12/09/2017, 05/28/2018

## 2020-07-31 NOTE — Telephone Encounter (Signed)
I am okay with extending the prednisone out further with a standard taper as below.  Also give her azithromycin Z-Pak please.  If she is not improving on the higher dose prednisone then I think she will have to come back and be seen again.  Prednisone >> Take 40mg  daily for 3 days, then 30mg  daily for 3 days, then 20mg  daily for 3 days, then 10mg  daily for 3 days, then stop

## 2020-07-31 NOTE — Telephone Encounter (Signed)
Called and spoke with Patient. Dr. Agustina Caroli recommendations given. Patient refused z pak , because Patient stated it cause diarrhea. Patient stated she has been diagnosis with osteoporosis, and stated prednisone was to much. Patient requested in office OV with Tammy, NP to discuss prednisone, antibiotic, and alternatives. Patient scheduled 08/01/20 at 3pm.

## 2020-08-01 ENCOUNTER — Encounter: Payer: Self-pay | Admitting: Adult Health

## 2020-08-01 ENCOUNTER — Ambulatory Visit (INDEPENDENT_AMBULATORY_CARE_PROVIDER_SITE_OTHER): Payer: Medicare Other | Admitting: Adult Health

## 2020-08-01 ENCOUNTER — Other Ambulatory Visit: Payer: Self-pay

## 2020-08-01 DIAGNOSIS — J479 Bronchiectasis, uncomplicated: Secondary | ICD-10-CM | POA: Diagnosis not present

## 2020-08-01 DIAGNOSIS — J301 Allergic rhinitis due to pollen: Secondary | ICD-10-CM

## 2020-08-01 MED ORDER — AZITHROMYCIN 250 MG PO TABS: ORAL_TABLET | ORAL | 0 refills | Status: AC

## 2020-08-01 NOTE — Assessment & Plan Note (Signed)
Slow to resolve - will tx with abx. And cont on mucocillary clearing regimen .   Plan  Patient Instructions  Covid 19 test , call if positive .  Zithromax 250mg  daily until gone.  Eat Yogurt daily  Continue on Probiotic daily  Saline nasal rinses and gel As needed   Flutter valve 3 times a day .  Mucinex Twice daily  .As needed  Congestion  Continue on Flonase and Claritin. Follow up with Dr. Lamonte Sakai  In 4 months and as needed Please contact office for sooner follow up if symptoms do not improve or worsen or seek emergency care

## 2020-08-01 NOTE — Progress Notes (Signed)
_0  ID: Monica [name], female    DOB: 06-27-1945, 75 y.o.   MRN: [id number]  Chief Complaint  Patient presents with  . Follow-up    Referring provider: Plotnikov, Evie Lacks, MD  HPI: 75 year old female followed for bronchiectasis with chronic cough complicated by GERD and allergic rhinitis.  Previous Pseudomonas on cultures. No evidence of airflow obstruction on methacholine challenge test in 2018 Medical history significant for A. fib on Eliquis, multiple antibiotic allergies and intolerances  TEST/EVENTS :  BAL 07/2016 -Psuedomonas (pan sensitive) Sputum AFB neg 2019   CT chest March 6, 2020Bronchiectasis, peribronchovascular nodularity/consolidation and volume loss in the lungs bilaterally, with some areas of minimal improvement. 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  Spirometry 2018 FEV1 81%, ratio 73, FVC 86%, mid flows decreased at 56%.  08/01/2020 Follow up : Bronchiectasis Patient returns for a 1 week follow-up.  Patient was seen last week with complaints of a 3-day increase shortness of breath, headache.  She has been exposed to a lot of dust and allergens. Felt to have a mild flare with allergic rhinitis.  Was given a short course of prednisone for 5 days.  Patient says that she continues to have ongoing symptoms, now has low energy , not sleeping well , and cough increased last 3 days, unable to get anything up .  She remains on Flonase and Claritin daily.  She uses her flutter valve 3 times a day. Unable to take albuterol. Did not tolerate hypertonic saline nebs.  No chest pain orthopnea or edema.  Appetite is okay. No n/v/d.       Allergies  Allergen Reactions  . Albuterol Other (See Comments)    Patient states put her into atrial fib last time she took this medication  . Macrobid [Nitrofurantoin Macrocrystal]     Diarrhea, nausea  . Amoxicillin-Pot Clavulanate Diarrhea    Has patient had a PCN reaction causing immediate  rash, facial/tongue/throat swelling, SOB or lightheadedness with hypotension:No Has patient had a PCN reaction causing severe rash involving mucus membranes or skin necrosis:No Has patient had a PCN reaction that required hospitalization:No Has patient had a PCN reaction occurring within the last 10 years:Yes If all of the above answers are "NO", then may proceed with Cephalosporin use  . Avelox [Moxifloxacin Hcl In Nacl] Nausea Only  . Bactrim [Sulfamethoxazole-Trimethoprim] Other (See Comments)    flushing  . Benzonatate Other (See Comments)    felt bad, out of sorts, disoriented.  . Ciprofloxacin Rash    Rash across abdomen    Immunization History  Administered Date(s) Administered  . Fluad Quad(high Dose 65+) 12/29/2018, 02/21/2020  . Influenza Split 02/25/2011, 02/05/2012  . Influenza Whole 03/02/2008, 03/02/2009, 02/06/2010  . Influenza, High Dose Seasonal PF 01/28/2017  . Influenza,inj,Quad PF,6+ Mos 02/04/2013, 01/24/2014, 02/02/2015, 02/01/2016, 01/22/2018  . Influenza-Unspecified 01/04/2017  . PFIZER(Purple Top)SARS-COV-2 Vaccination 05/27/2019, 06/17/2019, 02/08/2020  . Pneumococcal Conjugate-13 01/04/2013, 02/16/2013  . Pneumococcal Polysaccharide-23 03/11/2003, 01/16/2009, 09/26/2014  . Td 06/06/2001  . Tdap 07/17/2012  . Zoster 05/07/2007  . Zoster Recombinat (Shingrix) 05/07/2007, 12/09/2017, 05/28/2018    Past Medical History:  Diagnosis Date  . Adenomatous colon polyp 10/2003  . Allergic rhinitis   . Allergy    SEASONAL  . Bronchiectasis   . Chronic sinusitis   . Diverticulosis   . GERD (gastroesophageal reflux disease)   . Hearing loss   . Hemorrhoids   . Insomnia   . Menopausal disorder   . Osteoporosis   . Palpitations   .  Paroxysmal A-fib ([place])   . TMJ syndrome   . Urticaria     Tobacco History: Social History   Tobacco Use  Smoking Status Never Smoker  Smokeless Tobacco Never Used   Counseling given: Not Answered   Outpatient  Medications Prior to Visit  Medication Sig Dispense Refill  . acetaminophen (TYLENOL) 500 MG tablet Take 500 mg by mouth every 6 (six) hours as needed for moderate pain or headache.    . Butalbital-Acetaminophen 50-300 MG TABS Take 0.5-1 tablets by mouth 2 (two) times daily as needed. 30 tablet 1  . Calcium Carb-Cholecalciferol (CALCIUM 600-D PO) Take 1 tablet by mouth every evening.    . calcium carbonate (TUMS EX) 750 MG chewable tablet Chew 2 tablets by mouth at bedtime.     . Cholecalciferol (VITAMIN D) 1000 UNITS capsule Take 1,000 Units by mouth daily.    Marland Kitchen dextromethorphan (DELSYM) 30 MG/5ML liquid Take 30 mg by mouth at bedtime as needed for cough.     Marland Kitchen ELIQUIS 5 MG TABS tablet TAKE 1 TABLET TWICE A DAY (Patient taking differently: Take 5 mg by mouth 2 (two) times daily.) 180 tablet 3  . Estradiol 10 MCG TABS Place 10 mcg vaginally 2 (two) times a week.     . famotidine (PEPCID) 40 MG tablet Take 1 tablet (40 mg total) by mouth 2 (two) times daily. (Patient taking differently: Take 40 mg by mouth daily.) 60 tablet 11  . fluticasone (FLONASE) 50 MCG/ACT nasal spray Place 1 spray into both nostrils at bedtime as needed for allergies or rhinitis (seasonal allergies).     Marland Kitchen guaiFENesin (MUCINEX) 600 MG 12 hr tablet Take 600 mg by mouth 2 (two) times daily as needed for cough or to loosen phlegm.     . Lactobacillus Rhamnosus, GG, (CULTURELLE PO) Take 1 tablet by mouth daily.    Marland Kitchen loratadine (CLARITIN) 10 MG tablet Take 10 mg daily as needed by mouth for allergies.     . metoprolol tartrate (LOPRESSOR) 25 MG tablet Take 0.5 tablets (12.5 mg total) by mouth as needed. (Patient taking differently: Take 12.5 mg by mouth daily as needed (afib episode).) 15 tablet 1  . Multiple Vitamin (MULTIVITAMIN WITH MINERALS) TABS tablet Take 1 tablet by mouth every evening. Centrum Silver    . Respiratory Therapy Supplies (FLUTTER) DEVI Twice a day and prn as needed, may increase if feeling worse 1 each 0  .  predniSONE (DELTASONE) 20 MG tablet Take 1 tablet (20 mg total) by mouth daily with breakfast. 5 tablet 0   No facility-administered medications prior to visit.     Review of Systems:   Constitutional:   No  weight loss, night sweats,  Fevers, chills,  +fatigue, or  lassitude.  HEENT:   No headaches,  Difficulty swallowing,  Tooth/dental problems, or  Sore throat,                No sneezing, itching, ear ache, nasal congestion, post nasal drip,   CV:  No chest pain,  Orthopnea, PND, swelling in lower extremities, anasarca, dizziness, palpitations, syncope.   GI  No heartburn, indigestion, abdominal pain, nausea, vomiting, diarrhea, change in bowel habits, loss of appetite, bloody stools.   Resp:    No chest wall deformity  Skin: no rash or lesions.  GU: no dysuria, change in color of urine, no urgency or frequency.  No flank pain, no hematuria   MS:  No joint pain or swelling.  No decreased range of  motion.  No back pain.    Physical Exam  BP 114/66 (BP Location: Left Arm, Cuff Size: Normal)   Pulse 80   Temp (!) 97.2 F (36.2 C) (Temporal)   Ht _0  (1.549 m)   Wt 100 lb 9.6 oz (45.6 kg)   SpO2 99%   BMI 19.01 kg/m   GEN: A/Ox3; pleasant , NAD, well nourished    HEENT:  [place]/AT,    NOSE-clear, THROAT-clear, no lesions, no postnasal drip or exudate noted.   NECK:  Supple w/ fair ROM; no JVD; normal carotid impulses w/o bruits; no thyromegaly or nodules palpated; no lymphadenopathy.    RESP  Clear  P & A; w/o, wheezes/ rales/ or rhonchi. no accessory muscle use, no dullness to percussion  CARD:  RRR, no m/r/g, no peripheral edema, pulses intact, no cyanosis or clubbing.  GI:   Soft & nt; nml bowel sounds; no organomegaly or masses detected.   Musco: Warm bil, no deformities or joint swelling noted.   Neuro: alert, no focal deficits noted.    Skin: Warm, no lesions or rashes    Lab Results:  CBC  BMET  BNP No results found for: BNP  ProBNP No results  found for: PROBNP  Imaging: No results found.    PFT Results Latest Ref Rng & Units 09/13/2016 12/11/2015  FVC-Pre L 2.43 2.53  FVC-Predicted Pre % 89 92  FVC-Post L 2.53 2.49  FVC-Predicted Post % 93 91  Pre FEV1/FVC % % 82 79  Post FEV1/FCV % % 78 81  FEV1-Pre L 1.99 2.00  FEV1-Predicted Pre % 97 97  FEV1-Post L 1.98 2.01  DLCO uncorrected ml/min/mmHg - 17.03  DLCO UNC% % - 78  DLCO corrected ml/min/mmHg - 16.92  DLCO COR %Predicted % - 78  DLVA Predicted % - 90    No results found for: NITRICOXIDE      Assessment & Plan:   Bronchiectasis without acute exacerbation (HCC) Slow to resolve - will tx with abx. And cont on mucocillary clearing regimen .   Plan  Patient Instructions  Covid 19 test , call if positive .  Zithromax 264m daily until gone.  Eat Yogurt daily  Continue on Probiotic daily  Saline nasal rinses and gel As needed   Flutter valve 3 times a day .  Mucinex Twice daily  .As needed  Congestion  Continue on Flonase and Claritin. Follow up with Dr. BLamonte Sakai In 4 months and as needed Please contact office for sooner follow up if symptoms do not improve or worsen or seek emergency care        ALLERGIC RHINITIS, SEASONAL Cont trigger prevention   Plan  . Patient Instructions  Covid 19 test , call if positive .  Zithromax 2526mdaily until gone.  Eat Yogurt daily  Continue on Probiotic daily  Saline nasal rinses and gel As needed   Flutter valve 3 times a day .  Mucinex Twice daily  .As needed  Congestion  Continue on Flonase and Claritin. Follow up with Dr. ByLamonte SakaiIn 4 months and as needed Please contact office for sooner follow up if symptoms do not improve or worsen or seek emergency care           TaRexene EdisonNP 08/01/2020

## 2020-08-01 NOTE — Assessment & Plan Note (Signed)
Cont trigger prevention   Plan  . Patient Instructions  Covid 19 test , call if positive .  Zithromax 250mg  daily until gone.  Eat Yogurt daily  Continue on Probiotic daily  Saline nasal rinses and gel As needed   Flutter valve 3 times a day .  Mucinex Twice daily  .As needed  Congestion  Continue on Flonase and Claritin. Follow up with Dr. Lamonte Sakai  In 4 months and as needed Please contact office for sooner follow up if symptoms do not improve or worsen or seek emergency care

## 2020-08-01 NOTE — Patient Instructions (Addendum)
Covid 19 test , call if positive .  Zithromax 250mg  daily until gone.  Eat Yogurt daily  Continue on Probiotic daily  Saline nasal rinses and gel As needed   Flutter valve 3 times a day .  Mucinex Twice daily  .As needed  Congestion  Continue on Flonase and Claritin. Follow up with Dr. Lamonte Sakai  In 4 months and as needed Please contact office for sooner follow up if symptoms do not improve or worsen or seek emergency care

## 2020-08-02 NOTE — Telephone Encounter (Signed)
Please advise on patient mychart message  Tammy Covid test negative

## 2020-08-07 DIAGNOSIS — J479 Bronchiectasis, uncomplicated: Secondary | ICD-10-CM

## 2020-08-08 ENCOUNTER — Ambulatory Visit: Payer: Medicare Other

## 2020-08-08 ENCOUNTER — Other Ambulatory Visit (INDEPENDENT_AMBULATORY_CARE_PROVIDER_SITE_OTHER): Payer: Medicare Other

## 2020-08-08 ENCOUNTER — Ambulatory Visit (INDEPENDENT_AMBULATORY_CARE_PROVIDER_SITE_OTHER): Payer: Medicare Other

## 2020-08-08 DIAGNOSIS — J479 Bronchiectasis, uncomplicated: Secondary | ICD-10-CM | POA: Diagnosis not present

## 2020-08-08 DIAGNOSIS — J439 Emphysema, unspecified: Secondary | ICD-10-CM | POA: Diagnosis not present

## 2020-08-08 LAB — CBC WITH DIFFERENTIAL/PLATELET
Basophils Absolute: 0 10*3/uL (ref 0.0–0.1)
Basophils Relative: 0.6 % (ref 0.0–3.0)
Eosinophils Absolute: 0.1 10*3/uL (ref 0.0–0.7)
Eosinophils Relative: 0.9 % (ref 0.0–5.0)
HCT: 40.5 % (ref 36.0–46.0)
Hemoglobin: 13.8 g/dL (ref 12.0–15.0)
Lymphocytes Relative: 13.7 % (ref 12.0–46.0)
Lymphs Abs: 1.1 10*3/uL (ref 0.7–4.0)
MCHC: 33.9 g/dL (ref 30.0–36.0)
MCV: 91.9 fl (ref 78.0–100.0)
Monocytes Absolute: 0.6 10*3/uL (ref 0.1–1.0)
Monocytes Relative: 7.9 % (ref 3.0–12.0)
Neutro Abs: 6.2 10*3/uL (ref 1.4–7.7)
Neutrophils Relative %: 76.9 % (ref 43.0–77.0)
Platelets: 377 10*3/uL (ref 150.0–400.0)
RBC: 4.41 Mil/uL (ref 3.87–5.11)
RDW: 12.9 % (ref 11.5–15.5)
WBC: 8.1 10*3/uL (ref 4.0–10.5)

## 2020-08-08 LAB — BASIC METABOLIC PANEL
BUN: 18 mg/dL (ref 6–23)
CO2: 30 mEq/L (ref 19–32)
Calcium: 9 mg/dL (ref 8.4–10.5)
Chloride: 101 mEq/L (ref 96–112)
Creatinine, Ser: 0.76 mg/dL (ref 0.40–1.20)
GFR: 76.66 mL/min (ref >60.00)
Glucose, Bld: 87 mg/dL (ref 70–99)
Potassium: 3.6 mEq/L (ref 3.5–5.1)
Sodium: 136 mEq/L (ref 135–145)

## 2020-08-08 LAB — BRAIN NATRIURETIC PEPTIDE: Pro B Natriuretic peptide (BNP): 68 pg/mL (ref 0.0–100.0)

## 2020-08-08 NOTE — Telephone Encounter (Signed)
Patient has finished a Z-Pak and previously finished her steroid pack. Does not feel a whole lot better.  She has minimum cough.  No significant shortness of breath.  No discolored mucus or fever.  Main complaints are fatigue low energy headache intermittent nausea.  She does complain of discomfort in the chest area but says this has been chronic for a long time.  She just feels achy. No vomiting, hemoptysis, bloody stools, diarrhea, palpitations, syncope. In general says she just does not feel well.  Advised patient to return for chest x-ray and labs today.   Once labs are back will decide on next step   Please contact office for sooner follow up if symptoms do not improve or worsen or seek emergency care   Send to Dr. Lamonte Sakai  For [facility]

## 2020-08-08 NOTE — Telephone Encounter (Signed)
Please advise on patient mychart message  Monica [name], I finished the zpack on Sunday & had started to feel a bit better, but woke up today(Monday) feeling bad again. Headache with fullness in my head, some nausea, fatigue and discomfort in my sternum area just as I have been having all along. Coughing occasionally as I usually do, but not bringing up any sputum. using the flutter valve 3x/day. No fever. Please advise where I should go from here. Thanks

## 2020-08-09 ENCOUNTER — Telehealth (INDEPENDENT_AMBULATORY_CARE_PROVIDER_SITE_OTHER): Payer: Medicare Other | Admitting: Adult Health

## 2020-08-09 VITALS — HR 80 | Temp 98.4°F

## 2020-08-09 DIAGNOSIS — G44209 Tension-type headache, unspecified, not intractable: Secondary | ICD-10-CM | POA: Diagnosis not present

## 2020-08-09 DIAGNOSIS — R110 Nausea: Secondary | ICD-10-CM | POA: Diagnosis not present

## 2020-08-09 NOTE — Progress Notes (Signed)
Virtual Visit via Video Note  I connected with Monica [name]  on 08/09/20 at 11:00 AM EDT by a video enabled telemedicine application and verified that I am speaking with the correct person using two identifiers.  Location patient: home Location provider:work or home office Persons participating in the virtual visit: patient, provider  I discussed the limitations of evaluation and management by telemedicine and the availability of in person appointments. The patient expressed understanding and agreed to proceed.   HPI: 75 year old female who  has a past medical history of Adenomatous colon polyp (10/2003), Allergic rhinitis, Allergy, Bronchiectasis, Chronic sinusitis, Diverticulosis, GERD (gastroesophageal reflux disease), Hearing loss, Hemorrhoids, Insomnia, Menopausal disorder, Osteoporosis, Palpitations, Paroxysmal A-fib ([place]), TMJ syndrome, and Urticaria.  She presents to the office today for an acute issue of nausea and headaches.  He was recently seen by pulmonary twice over the last 2 weeks for bronchiectasis.  She was first seen on 07/27/2020 after she was exposed to a lot of paint dust and pollen.  This time she felt tired, had a headache and nausea.  She denied discolored sinus drainage or worsening cough.  She was tried on a 5-day course of prednisone.  Reports that this did not help much and followed up the next week with the same symptoms, was prescribed a Z-Pak.  Again did not have much improvement in.  Yesterday  had labs done including CBC, BMP, BNP all of which were negative as well as a chest x-ray which not show any acute changes.  He continues to have a daily headache with intermittent nausea.  Headache starts at the crown and radiates down to the ethmoid sinuses.  She denies sinus pain or pressure, rhinorrhea, fever, chills, vomiting, or gait instability.  The headache is constant but will subside.  Described as mild.  Has not experienced any blurred vision or aura.  Had CT head  done in 03/2020 which showed No evidence of acute intracranial abnormality.   ROS: See pertinent positives and negatives per HPI.  Past Medical History:  Diagnosis Date  . Adenomatous colon polyp 10/2003  . Allergic rhinitis   . Allergy    SEASONAL  . Bronchiectasis   . Chronic sinusitis   . Diverticulosis   . GERD (gastroesophageal reflux disease)   . Hearing loss   . Hemorrhoids   . Insomnia   . Menopausal disorder   . Osteoporosis   . Palpitations   . Paroxysmal A-fib ([place])   . TMJ syndrome   . Urticaria     Past Surgical History:  Procedure Laterality Date  . COLONOSCOPY    . NASAL SINUS SURGERY    . TYMPANOSTOMY    . VIDEO BRONCHOSCOPY Bilateral 07/16/2016   Procedure: VIDEO BRONCHOSCOPY WITH FLUORO;  Surgeon: Collene Gobble, MD;  Location: [facility];  Service: Cardiopulmonary;  Laterality: Bilateral;    Family History  Problem Relation Age of Onset  . Rectal cancer Mother        died age 37  . Seizures Mother   . Colon cancer Mother 62  . Cancer Mother   . Heart disease Father        dies age 3  . Hypertension Father   . Heart disease Maternal Grandfather   . AVM Brother   . Cancer Maternal Grandmother   . Cancer Paternal Uncle        multiple uncles with cancer  . Esophageal cancer Neg Hx        Current Outpatient Medications:  .  acetaminophen (TYLENOL) 500 MG tablet, Take 500 mg by mouth every 6 (six) hours as needed for moderate pain or headache., Disp: , Rfl:  .  Butalbital-Acetaminophen 50-300 MG TABS, Take 0.5-1 tablets by mouth 2 (two) times daily as needed., Disp: 30 tablet, Rfl: 1 .  Calcium Carb-Cholecalciferol (CALCIUM 600-D PO), Take 1 tablet by mouth every evening., Disp: , Rfl:  .  calcium carbonate (TUMS EX) 750 MG chewable tablet, Chew 2 tablets by mouth at bedtime. , Disp: , Rfl:  .  Cholecalciferol (VITAMIN D) 1000 UNITS capsule, Take 1,000 Units by mouth daily., Disp: , Rfl:  .  dextromethorphan (DELSYM) 30 MG/5ML liquid, Take  30 mg by mouth at bedtime as needed for cough. , Disp: , Rfl:  .  ELIQUIS 5 MG TABS tablet, TAKE 1 TABLET TWICE A DAY (Patient taking differently: Take 5 mg by mouth 2 (two) times daily.), Disp: 180 tablet, Rfl: 3 .  Estradiol 10 MCG TABS, Place 10 mcg vaginally 2 (two) times a week. , Disp: , Rfl:  .  famotidine (PEPCID) 40 MG tablet, Take 1 tablet (40 mg total) by mouth 2 (two) times daily. (Patient taking differently: Take 40 mg by mouth daily.), Disp: 60 tablet, Rfl: 11 .  fluticasone (FLONASE) 50 MCG/ACT nasal spray, Place 1 spray into both nostrils at bedtime as needed for allergies or rhinitis (seasonal allergies). , Disp: , Rfl:  .  guaiFENesin (MUCINEX) 600 MG 12 hr tablet, Take 600 mg by mouth 2 (two) times daily as needed for cough or to loosen phlegm. , Disp: , Rfl:  .  Lactobacillus Rhamnosus, GG, (CULTURELLE PO), Take 1 tablet by mouth daily., Disp: , Rfl:  .  loratadine (CLARITIN) 10 MG tablet, Take 10 mg daily as needed by mouth for allergies. , Disp: , Rfl:  .  metoprolol tartrate (LOPRESSOR) 25 MG tablet, Take 0.5 tablets (12.5 mg total) by mouth as needed. (Patient taking differently: Take 12.5 mg by mouth daily as needed (afib episode).), Disp: 15 tablet, Rfl: 1 .  Multiple Vitamin (MULTIVITAMIN WITH MINERALS) TABS tablet, Take 1 tablet by mouth every evening. Centrum Silver, Disp: , Rfl:  .  Respiratory Therapy Supplies (FLUTTER) DEVI, Twice a day and prn as needed, may increase if feeling worse, Disp: 1 each, Rfl: 0  EXAM:  VITALS per patient if applicable:  GENERAL: alert, oriented, appears well and in no acute distress  HEENT: atraumatic, conjunttiva clear, no obvious abnormalities on inspection of external nose and ears  NECK: normal movements of the head and neck  LUNGS: on inspection no signs of respiratory distress, breathing rate appears normal, no obvious gross SOB, gasping or wheezing  CV: no obvious cyanosis  MS: moves all visible extremities without  noticeable abnormality  PSYCH/NEURO: pleasant and cooperative, no obvious depression or anxiety, speech and thought processing grossly intact  ASSESSMENT AND PLAN:  Discussed the following assessment and plan:  1. Acute non intractable tension-type headache - While on the visit today she found her prescription for Butalbital/Acetaminopehn that was prescribed by her PCP. She is going to give this a try.  - Follow up with PCP if not resolved   2. Nausea - Has zofran at home and will try this. Nausea improved at time of visit      I discussed the assessment and treatment plan with the patient. The patient was provided an opportunity to ask questions and all were answered. The patient agreed with the plan and demonstrated an understanding of the instructions.  The patient was advised to call back or seek an in-person evaluation if the symptoms worsen or if the condition fails to improve as anticipated.   Dorothyann Peng, NP

## 2020-08-14 DIAGNOSIS — N952 Postmenopausal atrophic vaginitis: Secondary | ICD-10-CM | POA: Diagnosis not present

## 2020-08-14 DIAGNOSIS — Z01419 Encounter for gynecological examination (general) (routine) without abnormal findings: Secondary | ICD-10-CM | POA: Diagnosis not present

## 2020-08-14 DIAGNOSIS — Z681 Body mass index (BMI) 19 or less, adult: Secondary | ICD-10-CM | POA: Diagnosis not present

## 2020-08-14 DIAGNOSIS — Z1151 Encounter for screening for human papillomavirus (HPV): Secondary | ICD-10-CM | POA: Diagnosis not present

## 2020-08-14 DIAGNOSIS — M810 Age-related osteoporosis without current pathological fracture: Secondary | ICD-10-CM | POA: Diagnosis not present

## 2020-08-14 DIAGNOSIS — Z01411 Encounter for gynecological examination (general) (routine) with abnormal findings: Secondary | ICD-10-CM | POA: Diagnosis not present

## 2020-08-14 DIAGNOSIS — Z124 Encounter for screening for malignant neoplasm of cervix: Secondary | ICD-10-CM | POA: Diagnosis not present

## 2020-09-05 ENCOUNTER — Ambulatory Visit: Payer: TRICARE For Life (TFL)

## 2020-09-05 ENCOUNTER — Other Ambulatory Visit (HOSPITAL_BASED_OUTPATIENT_CLINIC_OR_DEPARTMENT_OTHER): Payer: Self-pay

## 2020-09-06 DIAGNOSIS — L570 Actinic keratosis: Secondary | ICD-10-CM | POA: Diagnosis not present

## 2020-09-06 DIAGNOSIS — L720 Epidermal cyst: Secondary | ICD-10-CM | POA: Diagnosis not present

## 2020-09-06 DIAGNOSIS — Z411 Encounter for cosmetic surgery: Secondary | ICD-10-CM | POA: Diagnosis not present

## 2020-09-07 DIAGNOSIS — M545 Low back pain, unspecified: Secondary | ICD-10-CM | POA: Diagnosis not present

## 2020-09-07 DIAGNOSIS — M25551 Pain in right hip: Secondary | ICD-10-CM | POA: Diagnosis not present

## 2020-09-07 DIAGNOSIS — M25552 Pain in left hip: Secondary | ICD-10-CM | POA: Diagnosis not present

## 2020-09-12 ENCOUNTER — Other Ambulatory Visit: Payer: Self-pay

## 2020-09-12 ENCOUNTER — Ambulatory Visit: Payer: Medicare Other | Attending: Internal Medicine

## 2020-09-12 ENCOUNTER — Other Ambulatory Visit (HOSPITAL_BASED_OUTPATIENT_CLINIC_OR_DEPARTMENT_OTHER): Payer: Self-pay

## 2020-09-12 DIAGNOSIS — Z23 Encounter for immunization: Secondary | ICD-10-CM

## 2020-09-12 MED ORDER — PFIZER-BIONT COVID-19 VAC-TRIS 30 MCG/0.3ML IM SUSP
INTRAMUSCULAR | 0 refills | Status: DC
Start: 2020-09-12 — End: 2021-05-24
  Filled 2020-09-12: qty 0.3, 1d supply, fill #0

## 2020-09-12 NOTE — Progress Notes (Signed)
   Covid-19 Vaccination Clinic  Name:  Monica [name]    MRN: [id number] DOB: 04-May-1946  09/12/2020  [name] was observed post Covid-19 immunization for 15 minutes without incident. She was provided with Vaccine Information Sheet and instruction to access the V-Safe system.   [name] was instructed to call 911 with any severe reactions post vaccine: Marland Kitchen Difficulty breathing  . Swelling of face and throat  . A fast heartbeat  . A bad rash all over body  . Dizziness and weakness   Immunizations Administered    Name Date Dose VIS Date Route   PFIZER Comrnaty(Gray TOP) Covid-19 Vaccine 09/12/2020 10:36 AM 0.3 mL 04/13/2020 Intramuscular   Manufacturer: [employer], [employer]   Lot: [id number]   NDC: [phone]

## 2020-09-19 ENCOUNTER — Telehealth: Payer: Self-pay | Admitting: *Deleted

## 2020-09-19 NOTE — Telephone Encounter (Signed)
Will route to PharmD for rec's re: holding anticoagulation. Richardson Dopp, PA-C    09/19/2020 4:33 PM

## 2020-09-19 NOTE — Telephone Encounter (Signed)
   [facility] Pre-operative Risk Assessment    Patient Name: SWEDEN LESURE  DOB: 1945/05/22  MRN: [id number]     Request for surgical clearance:  What type of surgery is being performed? New bridge  ( require manipulation of the gingival tissues some bleeding since there is already an existing bridge with recurrent decay subgingivally  1. When is this surgery scheduled?  09/26/20   2. What type of clearance is required (medical clearance vs. Pharmacy clearance to hold med vs. Both)? Both   3. Are there any medications that need to be held prior to surgery and how long? Eliquis  4. Practice name and name of physician performing surgery?  [facility] : Dr Sharyn Lull Goodrich,DDS  5. What is the office phone number? [phone]   7.   What is the office fax number? [phone]  8.   Anesthesia type (None, local, MAC, general) ? unknown   Niketa, Turner 09/19/2020, 3:39 PM  _________________________________________________________________   (provider comments below)

## 2020-09-20 NOTE — Telephone Encounter (Signed)
   Primary Cardiologist: Sanda Klein, MD  Chart reviewed as part of pre-operative protocol coverage.   75 y.o. female with  Paroxysmal atrial fibrillation   PACs, PVCs  Echocardiogram 6/17: EF 55-60, no significant valve dz  Bronchiectasis   GERD  IBS  Anxiety   Aortic atherosclerosis   RCRI:  Perioperative Risk of Major Cardiac Event is (%): 0.4 (low risk) DASI:  Functional Capacity in METs is: 5.07 (functional status is good )  Patient was contacted 09/20/2020 in reference to pre-operative risk assessment for pending surgery as outlined below.    Since last seen, BROOKLYNNE PEREIDA has done well without chest pain, significant shortness of breath.  Recommendations: . Therefore, based on ACC/AHA guidelines, the patient is at acceptable risk for the planned procedure without further cardiovascular testing.  . She can hold Apixaban (Eliquis) for 2 days prior to the procedure.  If she has a tooth extraction during her procedure, she should resume the Apixaban (Eliquis) 1 day after the procedure.  If no extraction is necessary, she can resume immediately post procedure. . No prophylactic antibiotics are needed.   Please call with questions. Richardson Dopp, PA-C 09/20/2020, 9:13 AM

## 2020-09-20 NOTE — Telephone Encounter (Signed)
Patient with diagnosis of afib on Eliquis for anticoagulation.    Procedure: New bridge  ( require manipulation of the gingival tissues some bleeding since there is already an existing bridge with recurrent decay subgingivally  Date of procedure: 09/26/20  CHA2DS2-VASc Score = 3  This indicates a 3.2% annual risk of stroke. The patient's score is based upon: CHF History: No HTN History: No Diabetes History: No Stroke History: No Vascular Disease History: No Age Score: 2 Gender Score: 1  CrCl 20mL/min Platelet count 377K  Patient does not require pre-op antibiotics for dental procedure.  Pt already cleared by Dr Sallyanne Kuster to hold Eliquis for 2 days prior and 1 day post procedure in 5/16 MyChart message.

## 2020-09-20 NOTE — Telephone Encounter (Signed)
Notes faxed to surgeon. This phone note will be removed from the preop pool. Richardson Dopp, PA-C  09/20/2020 9:17 AM

## 2020-10-20 DIAGNOSIS — H6121 Impacted cerumen, right ear: Secondary | ICD-10-CM | POA: Diagnosis not present

## 2020-11-01 DIAGNOSIS — M810 Age-related osteoporosis without current pathological fracture: Secondary | ICD-10-CM | POA: Diagnosis not present

## 2020-11-11 ENCOUNTER — Emergency Department (INDEPENDENT_AMBULATORY_CARE_PROVIDER_SITE_OTHER)
Admission: RE | Admit: 2020-11-11 | Discharge: 2020-11-11 | Disposition: A | Discharge status: Home / Self Care | Payer: Medicare Other | Source: Ambulatory Visit | Attending: Family Medicine | Admitting: Family Medicine

## 2020-11-11 ENCOUNTER — Other Ambulatory Visit: Payer: Self-pay

## 2020-11-11 VITALS — BP 130/82 | HR 95 | Temp 99.0°F | Resp 18

## 2020-11-11 DIAGNOSIS — R3121 Asymptomatic microscopic hematuria: Secondary | ICD-10-CM

## 2020-11-11 LAB — POCT URINALYSIS DIP (MANUAL ENTRY)
Bilirubin, UA: NEGATIVE
Glucose, UA: NEGATIVE mg/dL
Ketones, POC UA: NEGATIVE mg/dL
Leukocytes, UA: NEGATIVE
Nitrite, UA: NEGATIVE
Protein Ur, POC: NEGATIVE mg/dL
Spec Grav, UA: 1.01 (ref 1.010–1.025)
Urobilinogen, UA: 0.2 E.U./dL
pH, UA: 6 (ref 5.0–8.0)

## 2020-11-11 MED ORDER — FOSFOMYCIN TROMETHAMINE 3 G PO PACK: 3.0000 g | PACK | Freq: Once | ORAL | 0 refills | Status: AC

## 2020-11-11 NOTE — Discharge Instructions (Addendum)
Make sure you continue to drink lots of water Your urine culture is pending.  An RN will call you if it is positive If your symptoms worsen, if you develop burning with urination or frequency, consider taking fosfomycin Follow-up with your primary care doctor

## 2020-11-11 NOTE — ED Triage Notes (Signed)
Lower abdominal pain since Thursday Pain went away yesterday Pt states she checked her urine in a clear glass this am & noticed some "mucus strands"  Denies fever or chills  Intermittent nausea

## 2020-11-11 NOTE — ED Provider Notes (Signed)
[facility]    CSN: [id number] Arrival date & time: 11/11/20  1239      History   Chief Complaint Chief Complaint  Patient presents with   Abdominal Pain    HPI Monica [name] is a 75 y.o. female.   HPI Patient is here for some lower abdominal pain that comes and goes.  It is in the suprapubic region.  It is crampy in nature.  It is not present today.  She has been inspecting her urine daily because she is worried about having a urinary tract infection, that this pain may be from her bladder.  She has no dysuria or frequency.  No blood in urine or cloudy urine.  She states that when she lifted her urine this morning she did have strands of mucus in the urine.  She is here for that reason.  No history of kidney stones or kidney problems. She is on Eliquis for atrial flutter.  On medicines also for menopause, osteoporosis.  Past Medical History:  Diagnosis Date   Adenomatous colon polyp 10/2003   Allergic rhinitis    Allergy    SEASONAL   Bronchiectasis    Chronic sinusitis    Diverticulosis    GERD (gastroesophageal reflux disease)    Hearing loss    Hemorrhoids    Insomnia    Menopausal disorder    Osteoporosis    Palpitations    Paroxysmal A-fib (HCC)    TMJ syndrome    Urticaria     Patient Active Problem List   Diagnosis Date Noted   Constipation 02/28/2020   Dysuria 09/21/2018   Abdominal pain 09/21/2018   Sepsis ([place]) 06/07/2018   Thrush, oral 02/10/2018   Weight loss 11/12/2017   Acute intractable headache 12/27/2016   Cough 05/27/2016   Dizziness 01/28/2016   Headache 09/29/2015   PAF (paroxysmal atrial fibrillation) ([place]) 09/19/2015   Allergic reaction 03/22/2015   Mastoiditis of right side 03/13/2015   Chest pain, atypical 04/13/2014   Healthcare maintenance 02/16/2013   Elevated CA-125 02/16/2013   IBS (irritable bowel syndrome) 11/25/2010   ANXIETY DISORDER, SITUATIONAL, MILD 10/20/2009   ALLERGIC RHINITIS, SEASONAL 04/23/2007    GERD 03/10/2007   Insomnia disorder 01/26/2007   HEARING LOSS, LEFT EAR 01/26/2007   Sinusitis, chronic 01/26/2007   Bronchiectasis without acute exacerbation ([place]) 01/26/2007   TMJ SYNDROME 01/26/2007   MENOPAUSAL DISORDER 01/26/2007   OSTEOPOROSIS 01/26/2007   Palpitations 01/26/2007    Past Surgical History:  Procedure Laterality Date   COLONOSCOPY     NASAL SINUS SURGERY     TYMPANOSTOMY     VIDEO BRONCHOSCOPY Bilateral 07/16/2016   Procedure: VIDEO BRONCHOSCOPY WITH FLUORO;  Surgeon: Collene Gobble, MD;  Location: [facility];  Service: Cardiopulmonary;  Laterality: Bilateral;    OB History   No obstetric history on file.      Home Medications    Prior to Admission medications   Medication Sig Start Date End Date Taking? Authorizing Provider  denosumab (PROLIA) 60 MG/ML SOSY injection Inject 60 mg into the skin every 6 (six) months.   Yes [provider]  fosfomycin (MONUROL) 3 g PACK Take 3 g by mouth once for 1 dose. 11/11/20 11/11/20 Yes Raylene Everts, MD  acetaminophen (TYLENOL) 500 MG tablet Take 500 mg by mouth every 6 (six) hours as needed for moderate pain or headache.    [provider]  Butalbital-Acetaminophen 50-300 MG TABS Take 0.5-1 tablets by mouth 2 (two) times daily  as needed. 03/21/20   Plotnikov, Evie Lacks, MD  Calcium Carb-Cholecalciferol (CALCIUM 600-D PO) Take 1 tablet by mouth every evening.    [provider]  calcium carbonate (TUMS EX) 750 MG chewable tablet Chew 2 tablets by mouth at bedtime.     [provider]  Cholecalciferol (VITAMIN D) 1000 UNITS capsule Take 1,000 Units by mouth daily.    [provider]  COVID-19 mRNA Vac-TriS, Pfizer, (PFIZER-BIONT COVID-19 VAC-TRIS) SUSP injection Inject into the muscle. 09/12/20   Carlyle Basques, MD  dextromethorphan (DELSYM) 30 MG/5ML liquid Take 30 mg by mouth at bedtime as needed for cough.     [provider]  ELIQUIS 5 MG TABS tablet TAKE 1  TABLET TWICE A DAY Patient taking differently: Take 5 mg by mouth 2 (two) times daily. 12/09/19   Croitoru, Mihai, MD  Estradiol 10 MCG TABS Place 10 mcg vaginally 2 (two) times a week.     [provider]  famotidine (PEPCID) 40 MG tablet Take 1 tablet (40 mg total) by mouth 2 (two) times daily. Patient taking differently: Take 40 mg by mouth daily. 08/16/19 08/15/20  Ladene Artist, MD  fluticasone (FLONASE) 50 MCG/ACT nasal spray Place 1 spray into both nostrils at bedtime as needed for allergies or rhinitis (seasonal allergies).     [provider]  guaiFENesin (MUCINEX) 600 MG 12 hr tablet Take 600 mg by mouth 2 (two) times daily as needed for cough or to loosen phlegm.     [provider]  Lactobacillus Rhamnosus, GG, (CULTURELLE PO) Take 1 tablet by mouth daily.    [provider]  loratadine (CLARITIN) 10 MG tablet Take 10 mg daily as needed by mouth for allergies.     [provider]  metoprolol tartrate (LOPRESSOR) 25 MG tablet Take 0.5 tablets (12.5 mg total) by mouth as needed. Patient taking differently: Take 12.5 mg by mouth daily as needed (afib episode). 01/11/20   Croitoru, Mihai, MD  Multiple Vitamin (MULTIVITAMIN WITH MINERALS) TABS tablet Take 1 tablet by mouth every evening. Centrum Silver    [provider]  Respiratory Therapy Supplies (FLUTTER) DEVI Twice a day and prn as needed, may increase if feeling worse 01/30/18   Lauraine Rinne, NP    Family History Family History  Problem Relation Age of Onset   Rectal cancer Mother        died age 58   Seizures Mother    Colon cancer Mother 12   Cancer Mother    Heart disease Father        dies age 20   Hypertension Father    Heart disease Maternal Grandfather    AVM Brother    Cancer Maternal Grandmother    Cancer Paternal Uncle        multiple uncles with cancer   Esophageal cancer Neg Hx     Social History Social History   Tobacco Use   Smoking status: Never    Smokeless tobacco: Never  Vaping Use   Vaping Use: Never used  Substance Use Topics   Alcohol use: Not Currently    Alcohol/week: 2.0 standard drinks    Types: 2 Glasses of wine per week   Drug use: No     Allergies   Albuterol, Macrobid [nitrofurantoin macrocrystal], Amoxicillin-pot clavulanate, Avelox [moxifloxacin hcl in nacl], Bactrim [sulfamethoxazole-trimethoprim], Benzonatate, and Ciprofloxacin   Review of Systems Review of Systems  See HPI Physical Exam Triage Vital Signs ED Triage Vitals  Enc Vitals  Group     BP 11/11/20 1346 130/82     Pulse Rate 11/11/20 1346 95     Resp 11/11/20 1346 18     Temp 11/11/20 1346 99 F (37.2 C)     Temp Source 11/11/20 1346 Oral     SpO2 11/11/20 1346 98 %     Weight --      Height --      Head Circumference --      Peak Flow --      Pain Score 11/11/20 1347 2     Pain Loc --      Pain Edu? --      Excl. in [place]? --    No data found.  Updated Vital Signs BP 130/82 (BP Location: Right Arm)   Pulse 95   Temp 99 F (37.2 C) (Oral)   Resp 18   SpO2 98%      Physical Exam Constitutional:      General: She is not in acute distress.    Appearance: Normal appearance. She is well-developed.  HENT:     Head: Normocephalic and atraumatic.     Mouth/Throat:     Comments: Mask is in place Eyes:     Conjunctiva/sclera: Conjunctivae normal.     Pupils: Pupils are equal, round, and reactive to light.  Cardiovascular:     Rate and Rhythm: Normal rate and regular rhythm.     Heart sounds: Normal heart sounds.  Pulmonary:     Effort: Pulmonary effort is normal. No respiratory distress.     Breath sounds: Normal breath sounds.  Abdominal:     General: There is no distension.     Palpations: Abdomen is soft.     Tenderness: There is abdominal tenderness. There is no right CVA tenderness or left CVA tenderness.     Comments: mild tenderness to deep palpation in the right lower quadrant  Musculoskeletal:        General: Normal  range of motion.     Cervical back: Normal range of motion.  Skin:    General: Skin is warm and dry.  Neurological:     General: No focal deficit present.     Mental Status: She is alert.     UC Treatments / Results  Labs (all labs ordered are listed, but only abnormal results are displayed) Labs Reviewed  POCT URINALYSIS DIP (MANUAL ENTRY) - Abnormal; Notable for the following components:      Result Value   Blood, UA trace-lysed (*)    All other components within normal limits  URINE CULTURE    EKG   Radiology No results found.  Procedures Procedures (including critical care time)  Medications Ordered in UC Medications - No data to display  Initial Impression / Assessment and Plan / UC Course  I have reviewed the triage vital signs and the nursing notes.  Pertinent labs & imaging results that were available during my care of the patient were reviewed by me and considered in my medical decision making (see chart for details).     Patient is a retired [employer] scientist.  She had planned to go traveling with her sister.  She is worried about traveling with a possible urinary tract infection.  Urine culture is sent but will be available for couple of days.  I am sending with her a prescription for fosfomycin.  She states this worked for her in the past with minimal side effects.  She understands that if she checks her  culture in a couple of days, and it is positive, she can take the fosfomycin.  She also can take the medicine if she has symptoms that are more suggestive of of infection such as dysuria, frequency, fever Final Clinical Impressions(s) / UC Diagnoses   Final diagnoses:  Asymptomatic microscopic hematuria     Discharge Instructions      Make sure you continue to drink lots of water Your urine culture is pending.  An RN will call you if it is positive If your symptoms worsen, if you develop burning with urination or frequency, consider taking fosfomycin Follow-up  with your primary care doctor   ED Prescriptions     Medication Sig Dispense Auth. Provider   fosfomycin (MONUROL) 3 g PACK Take 3 g by mouth once for 1 dose. 3 g Raylene Everts, MD      PDMP not reviewed this encounter.   Raylene Everts, MD 11/11/20 [phone]

## 2020-11-14 LAB — URINE CULTURE
MICRO NUMBER:: 12101514
Result:: NO GROWTH
SPECIMEN QUALITY:: ADEQUATE

## 2020-11-19 ENCOUNTER — Other Ambulatory Visit: Payer: Self-pay | Admitting: Cardiovascular Disease

## 2020-11-20 DIAGNOSIS — Z20822 Contact with and (suspected) exposure to covid-19: Secondary | ICD-10-CM | POA: Diagnosis not present

## 2020-11-20 NOTE — Telephone Encounter (Signed)
69f, 45.6kg, scr 0.76 08/08/20, lovw/croitoru 01/11/20

## 2020-11-22 ENCOUNTER — Other Ambulatory Visit: Payer: Self-pay

## 2020-11-22 ENCOUNTER — Ambulatory Visit
Admission: RE | Admit: 2020-11-22 | Discharge: 2020-11-22 | Disposition: A | Discharge status: Home / Self Care | Payer: Medicare Other | Source: Ambulatory Visit | Attending: Emergency Medicine | Admitting: Emergency Medicine

## 2020-11-22 VITALS — BP 129/74 | HR 83 | Temp 97.9°F | Resp 16

## 2020-11-22 DIAGNOSIS — J069 Acute upper respiratory infection, unspecified: Secondary | ICD-10-CM | POA: Diagnosis not present

## 2020-11-22 DIAGNOSIS — J029 Acute pharyngitis, unspecified: Secondary | ICD-10-CM

## 2020-11-22 NOTE — Discharge Instructions (Addendum)
Strep test negative Sore throat likely viral or related allergies/postnasal drip Claritin, supplement with over-the-counter Mucinex Continue Tylenol Honey and hot tea Rest and fluids Follow-up if not improving or worsening over the next 4 to 5 days

## 2020-11-22 NOTE — ED Triage Notes (Signed)
Pt is present today with sore throat, fatigue, HA, nausea, and left side chest discomfort. Pt states that her sx started Monday. Pt states that she was tested for Covid Monday and the results was negative.

## 2020-11-22 NOTE — ED Provider Notes (Signed)
[facility]    CSN: [id number] Arrival date & time: 11/22/20  2725      History   Chief Complaint Chief Complaint  Patient presents with   Sore Throat   Headache   Nausea   Fatigue    HPI Monica [name] is a 75 y.o. female history of GERD, paroxysmal A. fib, presenting today for evaluation of URI symptoms.  Reports associated sore throat headache nausea and fatigue.  Reports symptoms began Monday and have persisted over the past 3 days.  Denies history of fevers chills or body aches.  Return reports 2 prior COVID test which are negative.  She reports sore throat has been the main concern.  She has also had a central chest discomfort.  Denies worsening cough or mucus production from baseline with her bronchiectasis.  Denies difficulty breathing.  Reports discomfort does not worsen with movement or breathing, coughing.  HPI  Past Medical History:  Diagnosis Date   Adenomatous colon polyp 10/2003   Allergic rhinitis    Allergy    SEASONAL   Bronchiectasis    Chronic sinusitis    Diverticulosis    GERD (gastroesophageal reflux disease)    Hearing loss    Hemorrhoids    Insomnia    Menopausal disorder    Osteoporosis    Palpitations    Paroxysmal A-fib (HCC)    TMJ syndrome    Urticaria     Patient Active Problem List   Diagnosis Date Noted   Constipation 02/28/2020   Dysuria 09/21/2018   Abdominal pain 09/21/2018   Sepsis ([place]) 06/07/2018   Thrush, oral 02/10/2018   Weight loss 11/12/2017   Acute intractable headache 12/27/2016   Cough 05/27/2016   Dizziness 01/28/2016   Headache 09/29/2015   PAF (paroxysmal atrial fibrillation) ([place]) 09/19/2015   Allergic reaction 03/22/2015   Mastoiditis of right side 03/13/2015   Chest pain, atypical 04/13/2014   Healthcare maintenance 02/16/2013   Elevated CA-125 02/16/2013   IBS (irritable bowel syndrome) 11/25/2010   ANXIETY DISORDER, SITUATIONAL, MILD 10/20/2009   ALLERGIC RHINITIS, SEASONAL 04/23/2007    GERD 03/10/2007   Insomnia disorder 01/26/2007   HEARING LOSS, LEFT EAR 01/26/2007   Sinusitis, chronic 01/26/2007   Bronchiectasis without acute exacerbation ([place]) 01/26/2007   TMJ SYNDROME 01/26/2007   MENOPAUSAL DISORDER 01/26/2007   OSTEOPOROSIS 01/26/2007   Palpitations 01/26/2007    Past Surgical History:  Procedure Laterality Date   COLONOSCOPY     NASAL SINUS SURGERY     TYMPANOSTOMY     VIDEO BRONCHOSCOPY Bilateral 07/16/2016   Procedure: VIDEO BRONCHOSCOPY WITH FLUORO;  Surgeon: Collene Gobble, MD;  Location: [facility];  Service: Cardiopulmonary;  Laterality: Bilateral;    OB History   No obstetric history on file.      Home Medications    Prior to Admission medications   Medication Sig Start Date End Date Taking? Authorizing Provider  acetaminophen (TYLENOL) 500 MG tablet Take 500 mg by mouth every 6 (six) hours as needed for moderate pain or headache.    [provider]  apixaban (ELIQUIS) 5 MG TABS tablet TAKE 1 TABLET TWICE A DAY 11/20/20   Croitoru, Mihai, MD  Calcium Carb-Cholecalciferol (CALCIUM 600-D PO) Take 1 tablet by mouth every evening.    [provider]  calcium carbonate (TUMS EX) 750 MG chewable tablet Chew 2 tablets by mouth at bedtime.     [provider]  Cholecalciferol (VITAMIN D) 1000 UNITS capsule Take 1,000 Units by mouth  daily.    [provider]  COVID-19 mRNA Vac-TriS, Pfizer, (PFIZER-BIONT COVID-19 VAC-TRIS) SUSP injection Inject into the muscle. 09/12/20   Carlyle Basques, MD  denosumab (PROLIA) 60 MG/ML SOSY injection Inject 60 mg into the skin every 6 (six) months.    [provider]  dextromethorphan (DELSYM) 30 MG/5ML liquid Take 30 mg by mouth at bedtime as needed for cough.     [provider]  Estradiol 10 MCG TABS Place 10 mcg vaginally 2 (two) times a week.     [provider]  famotidine (PEPCID) 40 MG tablet Take 1 tablet (40 mg total) by mouth 2 (two) times  daily. Patient taking differently: Take 40 mg by mouth daily. 08/16/19 08/15/20  Ladene Artist, MD  fluticasone (FLONASE) 50 MCG/ACT nasal spray Place 1 spray into both nostrils at bedtime as needed for allergies or rhinitis (seasonal allergies).     [provider]  guaiFENesin (MUCINEX) 600 MG 12 hr tablet Take 600 mg by mouth 2 (two) times daily as needed for cough or to loosen phlegm.     [provider]  Lactobacillus Rhamnosus, GG, (CULTURELLE PO) Take 1 tablet by mouth daily.    [provider]  loratadine (CLARITIN) 10 MG tablet Take 10 mg daily as needed by mouth for allergies.     [provider]  metoprolol tartrate (LOPRESSOR) 25 MG tablet Take 0.5 tablets (12.5 mg total) by mouth as needed. Patient taking differently: Take 12.5 mg by mouth daily as needed (afib episode). 01/11/20   Croitoru, Mihai, MD  Multiple Vitamin (MULTIVITAMIN WITH MINERALS) TABS tablet Take 1 tablet by mouth every evening. Centrum Silver    [provider]  Respiratory Therapy Supplies (FLUTTER) DEVI Twice a day and prn as needed, may increase if feeling worse 01/30/18   Lauraine Rinne, NP    Family History Family History  Problem Relation Age of Onset   Rectal cancer Mother        died age 37   Seizures Mother    Colon cancer Mother 35   Cancer Mother    Heart disease Father        dies age 38   Hypertension Father    Heart disease Maternal Grandfather    AVM Brother    Cancer Maternal Grandmother    Cancer Paternal Uncle        multiple uncles with cancer   Esophageal cancer Neg Hx     Social History Social History   Tobacco Use   Smoking status: Never   Smokeless tobacco: Never  Vaping Use   Vaping Use: Never used  Substance Use Topics   Alcohol use: Not Currently    Alcohol/week: 2.0 standard drinks    Types: 2 Glasses of wine per week   Drug use: No     Allergies   Albuterol, Macrobid [nitrofurantoin macrocrystal], Amoxicillin-pot  clavulanate, Avelox [moxifloxacin hcl in nacl], Bactrim [sulfamethoxazole-trimethoprim], Benzonatate, and Ciprofloxacin   Review of Systems Review of Systems  Constitutional:  Positive for fatigue. Negative for activity change, appetite change, chills and fever.  HENT:  Positive for congestion and sore throat. Negative for ear pain, rhinorrhea, sinus pressure and trouble swallowing.   Eyes:  Negative for discharge and redness.  Respiratory:  Positive for cough and chest tightness. Negative for shortness of breath.   Cardiovascular:  Negative for chest pain.  Gastrointestinal:  Positive for nausea. Negative for abdominal pain, diarrhea and vomiting.  Musculoskeletal:  Negative for myalgias.  Skin:  Negative for rash.  Neurological:  Negative for dizziness, light-headedness and headaches.    Physical Exam Triage Vital Signs ED Triage Vitals  Enc Vitals Group     BP      Pulse      Resp      Temp      Temp src      SpO2      Weight      Height      Head Circumference      Peak Flow      Pain Score      Pain Loc      Pain Edu?      Excl. in [place]?    No data found.  Updated Vital Signs BP 129/74   Pulse 83   Temp 97.9 F (36.6 C) (Oral)   Resp 16   SpO2 97%   Visual Acuity Right Eye Distance:   Left Eye Distance:   Bilateral Distance:    Right Eye Near:   Left Eye Near:    Bilateral Near:     Physical Exam Vitals and nursing note reviewed.  Constitutional:      Appearance: She is well-developed.     Comments: No acute distress  HENT:     Head: Normocephalic and atraumatic.     Ears:     Comments: Bilateral ears without tenderness to palpation of external auricle, tragus and mastoid, EAC's without erythema or swelling, TM's with good bony landmarks and cone of light. Non erythematous.      Nose: Nose normal.     Mouth/Throat:     Comments: Oral mucosa pink and moist, no tonsillar enlargement or exudate. Posterior pharynx patent and nonerythematous, no uvula  deviation or swelling. Normal phonation.  Eyes:     Conjunctiva/sclera: Conjunctivae normal.  Cardiovascular:     Rate and Rhythm: Normal rate.  Pulmonary:     Effort: Pulmonary effort is normal. No respiratory distress.     Comments: Breathing comfortably at rest, CTABL, no wheezing, rales or other adventitious sounds auscultated  Abdominal:     General: There is no distension.  Musculoskeletal:        General: Normal range of motion.     Cervical back: Neck supple.     Comments: No reproducible chest tenderness  Skin:    General: Skin is warm and dry.  Neurological:     Mental Status: She is alert and oriented to person, place, and time.     UC Treatments / Results  Labs (all labs ordered are listed, but only abnormal results are displayed) Labs Reviewed  CULTURE, GROUP A STREP [facility])  POCT RAPID STREP A (OFFICE)    EKG   Radiology No results found.  Procedures Procedures (including critical care time)  Medications Ordered in UC Medications - No data to display  Initial Impression / Assessment and Plan / UC Course  I have reviewed the triage vital signs and the nursing notes.  Pertinent labs & imaging results that were available during my care of the patient were reviewed by me and considered in my medical decision making (see chart for details).     EKG normal sinus rhythm, no acute signs of ischemia or infarction, strep test negative, suspect likely viral URI causing some underlying chest wall inflammation versus underlying GERD from postnasal drainage.  Recommending continued symptomatic and supportive care.  Recommendations provided.  Continue to monitor symptoms with patient,Discussed strict return precautions. Patient verbalized understanding and is  agreeable with plan.  Final Clinical Impressions(s) / UC Diagnoses   Final diagnoses:  Sore throat  Viral URI with cough     Discharge Instructions      Strep test negative Sore throat likely viral  or related allergies/postnasal drip Claritin, supplement with over-the-counter Mucinex Continue Tylenol Honey and hot tea Rest and fluids Follow-up if not improving or worsening over the next 4 to 5 days      ED Prescriptions   None    PDMP not reviewed this encounter.   Janith Lima, [place] 11/22/20 1042

## 2020-11-25 ENCOUNTER — Encounter (HOSPITAL_BASED_OUTPATIENT_CLINIC_OR_DEPARTMENT_OTHER): Payer: Self-pay | Admitting: Urology

## 2020-11-25 ENCOUNTER — Emergency Department (HOSPITAL_BASED_OUTPATIENT_CLINIC_OR_DEPARTMENT_OTHER)
Admission: EM | Admit: 2020-11-25 | Discharge: 2020-11-25 | Disposition: A | Discharge status: Home / Self Care | Payer: Medicare Other | Attending: Emergency Medicine | Admitting: Emergency Medicine

## 2020-11-25 ENCOUNTER — Emergency Department (HOSPITAL_BASED_OUTPATIENT_CLINIC_OR_DEPARTMENT_OTHER): Payer: Medicare Other

## 2020-11-25 ENCOUNTER — Emergency Department (INDEPENDENT_AMBULATORY_CARE_PROVIDER_SITE_OTHER)
Admission: RE | Admit: 2020-11-25 | Discharge: 2020-11-25 | Disposition: A | Discharge status: Home / Self Care | Payer: Medicare Other | Source: Ambulatory Visit

## 2020-11-25 ENCOUNTER — Other Ambulatory Visit: Payer: Self-pay

## 2020-11-25 VITALS — BP 130/75 | HR 90 | Temp 98.3°F | Resp 16

## 2020-11-25 DIAGNOSIS — R059 Cough, unspecified: Secondary | ICD-10-CM | POA: Insufficient documentation

## 2020-11-25 DIAGNOSIS — I480 Paroxysmal atrial fibrillation: Secondary | ICD-10-CM | POA: Insufficient documentation

## 2020-11-25 DIAGNOSIS — R079 Chest pain, unspecified: Secondary | ICD-10-CM

## 2020-11-25 DIAGNOSIS — J029 Acute pharyngitis, unspecified: Secondary | ICD-10-CM | POA: Diagnosis not present

## 2020-11-25 DIAGNOSIS — Z20822 Contact with and (suspected) exposure to covid-19: Secondary | ICD-10-CM | POA: Diagnosis not present

## 2020-11-25 DIAGNOSIS — R0789 Other chest pain: Secondary | ICD-10-CM | POA: Diagnosis not present

## 2020-11-25 DIAGNOSIS — M542 Cervicalgia: Secondary | ICD-10-CM | POA: Diagnosis not present

## 2020-11-25 DIAGNOSIS — Z7901 Long term (current) use of anticoagulants: Secondary | ICD-10-CM | POA: Diagnosis not present

## 2020-11-25 LAB — CBC WITH DIFFERENTIAL/PLATELET
Abs Immature Granulocytes: 0.03 10*3/uL (ref 0.00–0.07)
Basophils Absolute: 0.1 10*3/uL (ref 0.0–0.1)
Basophils Relative: 1 %
Eosinophils Absolute: 0.1 10*3/uL (ref 0.0–0.5)
Eosinophils Relative: 1 %
HCT: 44.1 % (ref 36.0–46.0)
Hemoglobin: 14.7 g/dL (ref 12.0–15.0)
Immature Granulocytes: 0 %
Lymphocytes Relative: 12 %
Lymphs Abs: 1.5 10*3/uL (ref 0.7–4.0)
MCH: 30.9 pg (ref 26.0–34.0)
MCHC: 33.3 g/dL (ref 30.0–36.0)
MCV: 92.6 fL (ref 80.0–100.0)
Monocytes Absolute: 0.9 10*3/uL (ref 0.1–1.0)
Monocytes Relative: 7 %
Neutro Abs: 10.2 10*3/uL — ABNORMAL HIGH (ref 1.7–7.7)
Neutrophils Relative %: 79 %
Platelets: 367 10*3/uL (ref 150–400)
RBC: 4.76 MIL/uL (ref 3.87–5.11)
RDW: 12.3 % (ref 11.5–15.5)
WBC: 12.7 10*3/uL — ABNORMAL HIGH (ref 4.0–10.5)
nRBC: 0 % (ref 0.0–0.2)

## 2020-11-25 LAB — BASIC METABOLIC PANEL
Anion gap: 9 (ref 5–15)
BUN: 18 mg/dL (ref 8–23)
CO2: 28 mmol/L (ref 22–32)
Calcium: 9.6 mg/dL (ref 8.9–10.3)
Chloride: 100 mmol/L (ref 98–111)
Creatinine, Ser: 0.69 mg/dL (ref 0.44–1.00)
GFR, Estimated: >60 mL/min (ref >60)
Glucose, Bld: 92 mg/dL (ref 70–99)
Potassium: 4 mmol/L (ref 3.5–5.1)
Sodium: 137 mmol/L (ref 135–145)

## 2020-11-25 LAB — CULTURE, GROUP A STREP (THRC)

## 2020-11-25 LAB — RESP PANEL BY RT-PCR (FLU A&B, COVID) ARPGX2
Influenza A by PCR: NEGATIVE
Influenza B by PCR: NEGATIVE
SARS Coronavirus 2 by RT PCR: NEGATIVE

## 2020-11-25 LAB — TROPONIN I (HIGH SENSITIVITY): Troponin I (High Sensitivity): 4 ng/L (ref <18)

## 2020-11-25 MED ORDER — DEXAMETHASONE 6 MG PO TABS
10.0000 mg | ORAL_TABLET | Freq: Once | ORAL | Status: AC
  Administered 2020-11-25: 10 mg via ORAL
  Filled 2020-11-25: qty 1

## 2020-11-25 NOTE — ED Triage Notes (Signed)
Pt here for a recheck of sore throat - pt seen recently for same- see chart review OTC - warm salt water gargles & cough drops - no relief  Urinary symptoms have resolved from last visit

## 2020-11-25 NOTE — ED Notes (Signed)
Patient is being discharged from the Urgent Care and sent to the Emergency Department via POV. Per J. [place], [place], patient is in need of higher level of care due to symptoms and tests needed. Patient is aware and verbalizes understanding of plan of care.  Vitals:   11/25/20 1053  BP: 130/75  Pulse: 90  Resp: 16  Temp: 98.3 F (36.8 C)  SpO2: 98%

## 2020-11-25 NOTE — ED Provider Notes (Signed)
[place]  ____________________________________________  Time seen: Approximately 11:35 AM  I have reviewed the triage vital signs and the nursing notes.   HISTORY  Chief Complaint Sore Throat   Historian Patient    HPI NATANE DISANTIS is a 75 y.o. female presents to the urgent care with concern for persistent chest discomfort and right-sided neck pain.  Patient has a history of atrial fibrillation and is concerned that she might have had a "run of A. fib" last night.  Patient reports that she has taken her Eliquis as directed.  She denies current chest tightness.  Patient reports that her current symptoms feel different than viral URI-like symptoms she has experienced in the past.  She denies shortness of breath or persistent cough.  Patient has been taking Tylenol and ibuprofen alternating and reports no significant improvement in her discomfort.   Past Medical History:  Diagnosis Date   Adenomatous colon polyp 10/2003   Allergic rhinitis    Allergy    SEASONAL   Bronchiectasis    Chronic sinusitis    Diverticulosis    GERD (gastroesophageal reflux disease)    Hearing loss    Hemorrhoids    Insomnia    Menopausal disorder    Osteoporosis    Palpitations    Paroxysmal A-fib (HCC)    TMJ syndrome    Urticaria      Immunizations up to date:  Yes.     Past Medical History:  Diagnosis Date   Adenomatous colon polyp 10/2003   Allergic rhinitis    Allergy    SEASONAL   Bronchiectasis    Chronic sinusitis    Diverticulosis    GERD (gastroesophageal reflux disease)    Hearing loss    Hemorrhoids    Insomnia    Menopausal disorder    Osteoporosis    Palpitations    Paroxysmal A-fib (HCC)    TMJ syndrome    Urticaria     Patient Active Problem List   Diagnosis Date Noted   Constipation 02/28/2020   Dysuria 09/21/2018   Abdominal pain 09/21/2018   Sepsis ([place]) 06/07/2018   Thrush, oral 02/10/2018   Weight loss 11/12/2017   Acute  intractable headache 12/27/2016   Cough 05/27/2016   Dizziness 01/28/2016   Headache 09/29/2015   PAF (paroxysmal atrial fibrillation) ([place]) 09/19/2015   Allergic reaction 03/22/2015   Mastoiditis of right side 03/13/2015   Chest pain, atypical 04/13/2014   Healthcare maintenance 02/16/2013   Elevated CA-125 02/16/2013   IBS (irritable bowel syndrome) 11/25/2010   ANXIETY DISORDER, SITUATIONAL, MILD 10/20/2009   ALLERGIC RHINITIS, SEASONAL 04/23/2007   GERD 03/10/2007   Insomnia disorder 01/26/2007   HEARING LOSS, LEFT EAR 01/26/2007   Sinusitis, chronic 01/26/2007   Bronchiectasis without acute exacerbation ([place]) 01/26/2007   TMJ SYNDROME 01/26/2007   MENOPAUSAL DISORDER 01/26/2007   OSTEOPOROSIS 01/26/2007   Palpitations 01/26/2007    Past Surgical History:  Procedure Laterality Date   COLONOSCOPY     NASAL SINUS SURGERY     TYMPANOSTOMY     VIDEO BRONCHOSCOPY Bilateral 07/16/2016   Procedure: VIDEO BRONCHOSCOPY WITH FLUORO;  Surgeon: Collene Gobble, MD;  Location: [facility];  Service: Cardiopulmonary;  Laterality: Bilateral;    Prior to Admission medications   Medication Sig Start Date End Date Taking? Authorizing Provider  acetaminophen (TYLENOL) 500 MG tablet Take 500 mg by mouth every 6 (six) hours as needed for moderate pain or headache.    [provider]  apixaban Arne [place])  5 MG TABS tablet TAKE 1 TABLET TWICE A DAY 11/20/20   Croitoru, Mihai, MD  Calcium Carb-Cholecalciferol (CALCIUM 600-D PO) Take 1 tablet by mouth every evening.    [provider]  calcium carbonate (TUMS EX) 750 MG chewable tablet Chew 2 tablets by mouth at bedtime.     [provider]  Cholecalciferol (VITAMIN D) 1000 UNITS capsule Take 1,000 Units by mouth daily.    [provider]  COVID-19 mRNA Vac-TriS, Pfizer, (PFIZER-BIONT COVID-19 VAC-TRIS) SUSP injection Inject into the muscle. 09/12/20   Carlyle Basques, MD  denosumab (PROLIA) 60 MG/ML SOSY injection  Inject 60 mg into the skin every 6 (six) months.    [provider]  dextromethorphan (DELSYM) 30 MG/5ML liquid Take 30 mg by mouth at bedtime as needed for cough.     [provider]  Estradiol 10 MCG TABS Place 10 mcg vaginally 2 (two) times a week.     [provider]  famotidine (PEPCID) 40 MG tablet Take 1 tablet (40 mg total) by mouth 2 (two) times daily. Patient taking differently: Take 40 mg by mouth daily. 08/16/19 08/15/20  Ladene Artist, MD  fluticasone (FLONASE) 50 MCG/ACT nasal spray Place 1 spray into both nostrils at bedtime as needed for allergies or rhinitis (seasonal allergies).     [provider]  guaiFENesin (MUCINEX) 600 MG 12 hr tablet Take 600 mg by mouth 2 (two) times daily as needed for cough or to loosen phlegm.     [provider]  Lactobacillus Rhamnosus, GG, (CULTURELLE PO) Take 1 tablet by mouth daily.    [provider]  loratadine (CLARITIN) 10 MG tablet Take 10 mg daily as needed by mouth for allergies.     [provider]  metoprolol tartrate (LOPRESSOR) 25 MG tablet Take 0.5 tablets (12.5 mg total) by mouth as needed. Patient taking differently: Take 12.5 mg by mouth daily as needed (afib episode). 01/11/20   Croitoru, Mihai, MD  Multiple Vitamin (MULTIVITAMIN WITH MINERALS) TABS tablet Take 1 tablet by mouth every evening. Centrum Silver    [provider]  Respiratory Therapy Supplies (FLUTTER) DEVI Twice a day and prn as needed, may increase if feeling worse 01/30/18   Lauraine Rinne, NP    Allergies Albuterol, Macrobid [nitrofurantoin macrocrystal], Amoxicillin-pot clavulanate, Avelox [moxifloxacin hcl in nacl], Bactrim [sulfamethoxazole-trimethoprim], Benzonatate, and Ciprofloxacin  Family History  Problem Relation Age of Onset   Rectal cancer Mother        died age 60   Seizures Mother    Colon cancer Mother 13   Cancer Mother    Heart disease Father        dies age 41    Hypertension Father    Heart disease Maternal Grandfather    AVM Brother    Cancer Maternal Grandmother    Cancer Paternal Uncle        multiple uncles with cancer   Esophageal cancer Neg Hx     Social History Social History   Tobacco Use   Smoking status: Never   Smokeless tobacco: Never  Vaping Use   Vaping Use: Never used  Substance Use Topics   Alcohol use: Not Currently    Alcohol/week: 2.0 standard drinks    Types: 2 Glasses of wine per week   Drug use: No     Review of Systems  Constitutional: No fever/chills Eyes:  No discharge ENT: Patient has neck pain.  Respiratory: no cough. No SOB/ use of accessory  muscles to breath Gastrointestinal:   No nausea, no vomiting.  No diarrhea.  No constipation. Musculoskeletal: Negative for musculoskeletal pain. Skin: Negative for rash, abrasions, lacerations, ecchymosis.   ____________________________________________   PHYSICAL EXAM:  VITAL SIGNS: ED Triage Vitals  Enc Vitals Group     BP 11/25/20 1053 130/75     Pulse Rate 11/25/20 1053 90     Resp 11/25/20 1053 16     Temp 11/25/20 1053 98.3 F (36.8 C)     Temp Source 11/25/20 1053 Oral     SpO2 11/25/20 1053 98 %     Weight --      Height --      Head Circumference --      Peak Flow --      Pain Score 11/25/20 1054 4     Pain Loc --      Pain Edu? --      Excl. in [place]? --      Constitutional: Alert and oriented. Well appearing and in no acute distress. Eyes: Conjunctivae are normal. PERRL. EOMI. Head: Atraumatic. ENT:      Ears: Tms are pearly.       Nose: No congestion/rhinnorhea.      Mouth/Throat: Mucous membranes are moist.  Neck: No stridor.  No cervical spine tenderness to palpation. Cardiovascular: Normal rate, regular rhythm. Normal S1 and S2.  Good peripheral circulation. Respiratory: Normal respiratory effort without tachypnea or retractions. Lungs CTAB. Good air entry to the bases with no decreased or absent breath sounds Gastrointestinal:  Bowel sounds x 4 quadrants. Soft and nontender to palpation. No guarding or rigidity. No distention. Musculoskeletal: Full range of motion to all extremities. No obvious deformities noted Neurologic:  Normal for age. No gross focal neurologic deficits are appreciated.  Skin:  Skin is warm, dry and intact. No rash noted. Psychiatric: Mood and affect are normal for age. Speech and behavior are normal.   ____________________________________________   LABS (all labs ordered are listed, but only abnormal results are displayed)  Labs Reviewed - No data to display ____________________________________________  EKG   ____________________________________________  RADIOLOGY   No results found.  ____________________________________________    PROCEDURES  Procedure(s) performed:     Procedures     Medications - No data to display   ____________________________________________   INITIAL IMPRESSION / ASSESSMENT AND PLAN / ED COURSE  Pertinent labs & imaging results that were available during my care of the patient were reviewed by me and considered in my medical decision making (see chart for details).    Assessment and plan Neck pain Nonspecific chest pain 75 year old female presents to the urgent care for recheck of persistent chest discomfort and right-sided neck pain.  This is the second time patient has been seen and evaluated for similar symptoms.  Her vital signs were reassuring at triage.  However, I am concerned that patient has not had a cardiac work-up given her history.  Patient was referred to [facility] for further care and management.  Patient states that she feels comfortable driving herself to [address] [place] [place], which is near her home.  Given that patient is hemodynamically stable and in no apparent distress, I feel patient is appropriate to drive herself to the emergency department at this time.     _____________________________ _______________  FINAL  CLINICAL IMPRESSION(S) / ED DIAGNOSES  Final diagnoses:  Chest pain, unspecified type  Neck pain      NEW MEDICATIONS STARTED DURING THIS VISIT:  ED Discharge Orders  None           This chart was dictated using voice recognition software/Dragon. Despite best efforts to proofread, errors can occur which can change the meaning. Any change was purely unintentional.     Lannie Fields, PA-C 11/25/20 1142

## 2020-11-25 NOTE — ED Provider Notes (Signed)
[facility] EM[facility] DEPT Provider Note   CSN: [id number] Arrival date & time: 11/25/20  1223     History Chief Complaint  Patient presents with   Sore Throat   Chest Pain    Monica [name] is a 75 y.o. female.  The history is provided by the patient.  Sore Throat This is a new problem. The problem occurs constantly. Associated symptoms include chest pain. Pertinent negatives include no abdominal pain and no shortness of breath. Nothing aggravates the symptoms. Nothing relieves the symptoms. She has tried nothing for the symptoms. The treatment provided no relief.  Chest Pain Associated symptoms: cough   Associated symptoms: no abdominal pain, no back pain, no fever, no palpitations, no shortness of breath and no vomiting       Past Medical History:  Diagnosis Date   Adenomatous colon polyp 10/2003   Allergic rhinitis    Allergy    SEASONAL   Bronchiectasis    Chronic sinusitis    Diverticulosis    GERD (gastroesophageal reflux disease)    Hearing loss    Hemorrhoids    Insomnia    Menopausal disorder    Osteoporosis    Palpitations    Paroxysmal A-fib (HCC)    TMJ syndrome    Urticaria     Patient Active Problem List   Diagnosis Date Noted   Constipation 02/28/2020   Dysuria 09/21/2018   Abdominal pain 09/21/2018   Sepsis ([place]) 06/07/2018   Thrush, oral 02/10/2018   Weight loss 11/12/2017   Acute intractable headache 12/27/2016   Cough 05/27/2016   Dizziness 01/28/2016   Headache 09/29/2015   PAF (paroxysmal atrial fibrillation) ([place]) 09/19/2015   Allergic reaction 03/22/2015   Mastoiditis of right side 03/13/2015   Chest pain, atypical 04/13/2014   Healthcare maintenance 02/16/2013   Elevated CA-125 02/16/2013   IBS (irritable bowel syndrome) 11/25/2010   ANXIETY DISORDER, SITUATIONAL, MILD 10/20/2009   ALLERGIC RHINITIS, SEASONAL 04/23/2007   GERD 03/10/2007   Insomnia disorder 01/26/2007   HEARING LOSS, LEFT EAR 01/26/2007    Sinusitis, chronic 01/26/2007   Bronchiectasis without acute exacerbation ([place]) 01/26/2007   TMJ SYNDROME 01/26/2007   MENOPAUSAL DISORDER 01/26/2007   OSTEOPOROSIS 01/26/2007   Palpitations 01/26/2007    Past Surgical History:  Procedure Laterality Date   COLONOSCOPY     NASAL SINUS SURGERY     TYMPANOSTOMY     VIDEO BRONCHOSCOPY Bilateral 07/16/2016   Procedure: VIDEO BRONCHOSCOPY WITH FLUORO;  Surgeon: Collene Gobble, MD;  Location: [facility];  Service: Cardiopulmonary;  Laterality: Bilateral;     OB History   No obstetric history on file.     Family History  Problem Relation Age of Onset   Rectal cancer Mother        died age 16   Seizures Mother    Colon cancer Mother 82   Cancer Mother    Heart disease Father        dies age 54   Hypertension Father    Heart disease Maternal Grandfather    AVM Brother    Cancer Maternal Grandmother    Cancer Paternal Uncle        multiple uncles with cancer   Esophageal cancer Neg Hx     Social History   Tobacco Use   Smoking status: Never   Smokeless tobacco: Never  Vaping Use   Vaping Use: Never used  Substance Use Topics   Alcohol use: Not Currently    Alcohol/week: 2.0 standard  drinks    Types: 2 Glasses of wine per week   Drug use: No    Home Medications Prior to Admission medications   Medication Sig Start Date End Date Taking? Authorizing Provider  acetaminophen (TYLENOL) 500 MG tablet Take 500 mg by mouth every 6 (six) hours as needed for moderate pain or headache.    [provider]  apixaban (ELIQUIS) 5 MG TABS tablet TAKE 1 TABLET TWICE A DAY 11/20/20   Croitoru, Mihai, MD  Calcium Carb-Cholecalciferol (CALCIUM 600-D PO) Take 1 tablet by mouth every evening.    [provider]  calcium carbonate (TUMS EX) 750 MG chewable tablet Chew 2 tablets by mouth at bedtime.     [provider]  Cholecalciferol (VITAMIN D) 1000 UNITS capsule Take 1,000 Units by mouth daily.    [provider]  COVID-19 mRNA Vac-TriS, Pfizer, (PFIZER-BIONT COVID-19 VAC-TRIS) SUSP injection Inject into the muscle. 09/12/20   Carlyle Basques, MD  denosumab (PROLIA) 60 MG/ML SOSY injection Inject 60 mg into the skin every 6 (six) months.    [provider]  dextromethorphan (DELSYM) 30 MG/5ML liquid Take 30 mg by mouth at bedtime as needed for cough.     [provider]  Estradiol 10 MCG TABS Place 10 mcg vaginally 2 (two) times a week.     [provider]  famotidine (PEPCID) 40 MG tablet Take 1 tablet (40 mg total) by mouth 2 (two) times daily. Patient taking differently: Take 40 mg by mouth daily. 08/16/19 08/15/20  Ladene Artist, MD  fluticasone (FLONASE) 50 MCG/ACT nasal spray Place 1 spray into both nostrils at bedtime as needed for allergies or rhinitis (seasonal allergies).     [provider]  guaiFENesin (MUCINEX) 600 MG 12 hr tablet Take 600 mg by mouth 2 (two) times daily as needed for cough or to loosen phlegm.     [provider]  Lactobacillus Rhamnosus, GG, (CULTURELLE PO) Take 1 tablet by mouth daily.    [provider]  loratadine (CLARITIN) 10 MG tablet Take 10 mg daily as needed by mouth for allergies.     [provider]  metoprolol tartrate (LOPRESSOR) 25 MG tablet Take 0.5 tablets (12.5 mg total) by mouth as needed. Patient taking differently: Take 12.5 mg by mouth daily as needed (afib episode). 01/11/20   Croitoru, Mihai, MD  Multiple Vitamin (MULTIVITAMIN WITH MINERALS) TABS tablet Take 1 tablet by mouth every evening. Centrum Silver    [provider]  Respiratory Therapy Supplies (FLUTTER) DEVI Twice a day and prn as needed, may increase if feeling worse 01/30/18   Lauraine Rinne, NP    Allergies    Albuterol, Macrobid [nitrofurantoin macrocrystal], Amoxicillin-pot clavulanate, Avelox [moxifloxacin hcl in nacl], Bactrim [sulfamethoxazole-trimethoprim], Benzonatate, and Ciprofloxacin  Review of  Systems   Review of Systems  Constitutional:  Negative for chills and fever.  HENT:  Positive for sore throat. Negative for ear pain.   Eyes:  Negative for pain and visual disturbance.  Respiratory:  Positive for cough. Negative for shortness of breath.   Cardiovascular:  Positive for chest pain. Negative for palpitations.  Gastrointestinal:  Negative for abdominal pain and vomiting.  Genitourinary:  Negative for dysuria and hematuria.  Musculoskeletal:  Negative for arthralgias and back pain.  Skin:  Negative for color change and rash.  Neurological:  Negative for seizures and syncope.  All other systems reviewed and are negative.  Physical Exam Updated Vital Signs BP 137/73   Pulse  85   Temp 98.3 F (36.8 C) (Oral)   Resp 15   Ht '5\' 1"'$  (1.549 m)   Wt 45.8 kg   SpO2 98%   BMI 19.08 kg/m   Physical Exam Vitals and nursing note reviewed.  Constitutional:      General: She is not in acute distress.    Appearance: She is well-developed. She is not ill-appearing.  HENT:     Head: Normocephalic and atraumatic.     Left Ear: Tympanic membrane normal.     Mouth/Throat:     Mouth: Mucous membranes are moist.     Pharynx: No oropharyngeal exudate or posterior oropharyngeal erythema.     Tonsils: No tonsillar exudate or tonsillar abscesses. 0 on the right. 0 on the left.  Eyes:     Extraocular Movements: Extraocular movements intact.     Conjunctiva/sclera: Conjunctivae normal.     Pupils: Pupils are equal, round, and reactive to light.  Cardiovascular:     Rate and Rhythm: Normal rate and regular rhythm.     Pulses: Normal pulses.     Heart sounds: Normal heart sounds. No murmur heard. Pulmonary:     Effort: Pulmonary effort is normal. No respiratory distress.     Breath sounds: Normal breath sounds.  Abdominal:     Palpations: Abdomen is soft.     Tenderness: There is no abdominal tenderness.  Musculoskeletal:     Cervical back: Neck supple.  Skin:    General: Skin is  warm and dry.     Capillary Refill: Capillary refill takes less than 2 seconds.  Neurological:     General: No focal deficit present.     Mental Status: She is alert.    ED Results / Procedures / Treatments   Labs (all labs ordered are listed, but only abnormal results are displayed) Labs Reviewed  CBC WITH DIFFERENTIAL/PLATELET - Abnormal; Notable for the following components:      Result Value   WBC 12.7 (*)    Neutro Abs 10.2 (*)    All other components within normal limits  RESP PANEL BY RT-PCR (FLU A&B, COVID) ARPGX2  BASIC METABOLIC PANEL  TROPONIN I (HIGH SENSITIVITY)    EKG EKG Interpretation  Date/Time:  Saturday November 25 2020 12:45:51 EDT Ventricular Rate:  92 PR Interval:  156 QRS Duration: 74 QT Interval:  348 QTC Calculation: 430 R Axis:   72 Text Interpretation: Normal sinus rhythm Right atrial enlargement Borderline ECG Confirmed by Lennice Sites ([id number]) on 11/25/2020 12:53:47 PM  Radiology DG Chest Portable 1 View  Result Date: 11/25/2020 CLINICAL DATA:  Pain.  Sore throat. EXAM: PORTABLE CHEST 1 VIEW COMPARISON:  08/08/2020 FINDINGS: Chronic streaky and patchy parenchymal lung densities are similar to the prior examination. No large areas of new airspace disease or consolidation. Heart and mediastinum are within normal limits. Negative for a pneumothorax. Trachea is midline. IMPRESSION: Chronic lung changes without acute findings. Electronically Signed   By: Markus Daft M.D.   On: 11/25/2020 14:04    Procedures Procedures   Medications Ordered in ED Medications  dexamethasone (DECADRON) tablet 10 mg (10 mg Oral Given 11/25/20 1414)    ED Course  I have reviewed the triage vital signs and the nursing notes.  Pertinent labs & imaging results that were available during my care of the patient were reviewed by me and considered in my medical decision making (see chart for details).    MDM Rules/Calculators/A&P  [name] is  here with sore throat, cough, chest pain.  Strep test negative several days ago.  Normal vitals.  No fever.  Was sent here by urgent care because she had a short bout of A. fib last night which she has a history above.  She is on blood thinners.  No active chest pain.  EKG shows sinus rhythm.  No ischemic changes.  Troponin within normal limits.  Doubt ACS.  No active chest pain.  Overall sounds like she has a viral process.  COVID test and flu test are negative.  No significant leukocytosis, anemia, electrolyte abnormality.  Chest x-ray without evidence of pneumonia.  Ears appear without any signs of infection.  Given a dose of Decadron for possibly allergy type symptoms/viral symptoms.  No concern for throat infection on exam.  Given reassurance and discharged in ED in good condition.  Understands return precautions.  This chart was dictated using voice recognition software.  Despite best efforts to proofread,  errors can occur which can change the documentation meaning.   Final Clinical Impression(s) / ED Diagnoses Final diagnoses:  Sore throat    Rx / DC Orders ED Discharge Orders     None        Lennice Sites, DO 11/25/20 1420

## 2020-11-25 NOTE — ED Triage Notes (Signed)
Sore throat since Monday, mild HA, nausea, fatigue.  Negative Covid PCR wed.  Seen at [facility] on wed.  Normal EKG at [facility]. Denies fever. States chest discomfort mid sternal.

## 2020-11-25 NOTE — Discharge Instructions (Addendum)
Please go to emergency department at [facility] for further care and management.

## 2020-11-29 DIAGNOSIS — H6981 Other specified disorders of Eustachian tube, right ear: Secondary | ICD-10-CM | POA: Diagnosis not present

## 2020-11-29 DIAGNOSIS — Z9622 Myringotomy tube(s) status: Secondary | ICD-10-CM | POA: Diagnosis not present

## 2020-11-29 DIAGNOSIS — J029 Acute pharyngitis, unspecified: Secondary | ICD-10-CM | POA: Diagnosis not present

## 2020-11-29 DIAGNOSIS — J3489 Other specified disorders of nose and nasal sinuses: Secondary | ICD-10-CM | POA: Diagnosis not present

## 2020-12-07 ENCOUNTER — Encounter: Payer: Self-pay | Admitting: Emergency Medicine

## 2020-12-07 ENCOUNTER — Other Ambulatory Visit: Payer: Self-pay

## 2020-12-07 ENCOUNTER — Ambulatory Visit (INDEPENDENT_AMBULATORY_CARE_PROVIDER_SITE_OTHER): Payer: Medicare Other | Admitting: Emergency Medicine

## 2020-12-07 VITALS — BP 124/72 | HR 89 | Temp 98.0°F | Ht 61.5 in | Wt 101.2 lb

## 2020-12-07 DIAGNOSIS — J479 Bronchiectasis, uncomplicated: Secondary | ICD-10-CM

## 2020-12-07 DIAGNOSIS — J301 Allergic rhinitis due to pollen: Secondary | ICD-10-CM | POA: Diagnosis not present

## 2020-12-07 NOTE — Patient Instructions (Addendum)
We will arrange for a CT scan of the chest without contrast in 2 months Please continue your loratadine 10 mg once daily. Continue nasal saline rinses once daily Start your fluticasone nasal spray in the fall when allergy season begins We will get you a new flutter valve.  Use this 3 times a day. COVID-19 vaccine is up-to-date Follow Dr. Lamonte Sakai in 2 months after your CT so that we can review the results together.

## 2020-12-07 NOTE — Progress Notes (Signed)
Subjective:    Patient ID: CHAZMINE MISHOE, female    DOB: Sep 07, 1945, 75 y.o.   MRN: [id number]  HPI  ROV 03/22/20 --75 year old woman, never smoker with a history of bronchiectasis, upper airway irritation syndrome with allergic rhinitis and GERD, and associated chronic cough.  No obstructive disease by methacholine 2018.  Reports today that she has been feeling OK, has good days and bad, and sometimes more UA in nature. She is producing mucous but not every day, usually white.  Uses flutter valve approximately 2x a day.  Mucinex twice a day, fluticasone nasal spray, Claritin. Currently on pepcid, has not responded much in the past to PPI's.  COVID 19 vaccine and flu shot up to date. She did have some constitutional sx w the flu shot.   ROV 12/07/20 --this is a follow-up visit for 75 year old woman with history of bronchiectasis, never smoker.  She has upper airway irritation syndrome and chronic cough due to this as well as allergic rhinitis and GERD.  Her methacholine challenge was reassuring 2018. FOB 2018 >> MAIC negative She reports that she had sore throat, some increased cough about 2 weeks ago. She COVID tested negative. Was seen in urgent care, strep negative. Her cough is paroxysmal, often white/yellow mucous.  On loratadine qd, plans to start flonase in the Fall. Uses flutter tid, some mucous. She is doing [place] qd. She tried NaCl nebs but didn't see any benefit.   CXR 11/25/20 reviewed by me, shows chronic changes without any new findings.                                                                                                                                                                                                               Review of Systems As per history of present illness     Objective:   Physical Exam Vitals:   12/07/20 0857  BP: 124/72  Pulse: 89  Temp: 98 F (36.7 C)  TempSrc: Oral  SpO2: 100%  Weight: 101 lb 3.2 oz (45.9 kg)  Height: 5' 1.5" (1.562  m)   'Gen: Pleasant, thin woman, well-appearing, in no distress,  normal affect  ENT: No lesions,  mouth clear,  oropharynx clear, no postnasal drip  Neck: No JVD, no stridor  Lungs: No use of accessory muscles, clear without rales or rhonchi, no wheeze   Cardiovascular: RRR, heart sounds normal, no murmur or gallops, no peripheral edema  Musculoskeletal: No deformities, no cyanosis or clubbing  Neuro: alert, non focal  Skin: Warm, no  lesions or rashes        Assessment & Plan:  Bronchiectasis without acute exacerbation (HCC) Overall stable bronchiectasis.  No documented cultures with [facility] but suspicious based on imaging and history.  We will plan to repeat her CT scan of the chest in 2 months.  Continue her flutter valve.  Continue to treat rhinitis as ordered.  She did not like the saline nebs so these have been discontinued.  ALLERGIC RHINITIS, SEASONAL Continue nasal saline rinses, loratadine, plan to restart fluticasone nasal spray in the fall when she is most symptomatic.  She is now off montelukast  Baltazar Apo, MD, PhD 12/07/2020, 1:28 PM [facility] [phone] or if no answer [phone]

## 2020-12-07 NOTE — Assessment & Plan Note (Signed)
Continue nasal saline rinses, loratadine, plan to restart fluticasone nasal spray in the fall when she is most symptomatic.  She is now off montelukast

## 2020-12-07 NOTE — Assessment & Plan Note (Signed)
Overall stable bronchiectasis.  No documented cultures with [facility] but suspicious based on imaging and history.  We will plan to repeat her CT scan of the chest in 2 months.  Continue her flutter valve.  Continue to treat rhinitis as ordered.  She did not like the saline nebs so these have been discontinued.

## 2021-01-09 ENCOUNTER — Other Ambulatory Visit: Payer: Self-pay

## 2021-01-09 ENCOUNTER — Ambulatory Visit
Admission: RE | Admit: 2021-01-09 | Discharge: 2021-01-09 | Disposition: A | Discharge status: Home / Self Care | Payer: Medicare Other | Source: Ambulatory Visit | Attending: Internal Medicine | Admitting: Internal Medicine

## 2021-01-09 VITALS — BP 122/79 | HR 80 | Temp 98.3°F | Resp 18

## 2021-01-09 DIAGNOSIS — S61234A Puncture wound without foreign body of right ring finger without damage to nail, initial encounter: Secondary | ICD-10-CM | POA: Diagnosis not present

## 2021-01-09 DIAGNOSIS — S6000XA Contusion of unspecified finger without damage to nail, initial encounter: Secondary | ICD-10-CM

## 2021-01-09 NOTE — ED Triage Notes (Addendum)
Pt states while gardening she got poked with fern and finger (right 4th finger) is still swollen and painful. Patient states she is on Eliquis, not actively bleeding at this time.  Started: two weeks ago

## 2021-01-09 NOTE — ED Provider Notes (Signed)
[facility]    CSN: [id number] Arrival date & time: 01/09/21  0957      History   Chief Complaint Chief Complaint  Patient presents with   Hand Pain    HPI Monica [name] is a 75 y.o. female. Pt states while gardening she poked her finger (right 4th finger) with a dead foreign plant 2 weeks ago and it is not yet completely healed. Patient states she is on Eliquis, and at the time of injury, she had significant bleeding.  Did not have significant bruising but did feel like there was a collection of blood under the skin and has a lump in this area.  Minimal pain at this time, and puncture wound area is only painful if she pushes on it now.  Denies erythema, swelling, drainage, open wound.  Patient treated injury when it happened with Neosporin and a bandage.  Has continued to use warm water soaks to try to resolve the lump.  The lump has gone down in size, but is persistent.   Hand Pain   Past Medical History:  Diagnosis Date   Adenomatous colon polyp 10/2003   Allergic rhinitis    Allergy    SEASONAL   Bronchiectasis    Chronic sinusitis    Diverticulosis    GERD (gastroesophageal reflux disease)    Hearing loss    Hemorrhoids    Insomnia    Menopausal disorder    Osteoporosis    Palpitations    Paroxysmal A-fib (HCC)    TMJ syndrome    Urticaria     Patient Active Problem List   Diagnosis Date Noted   Constipation 02/28/2020   Dysuria 09/21/2018   Abdominal pain 09/21/2018   Sepsis ([place]) 06/07/2018   Thrush, oral 02/10/2018   Weight loss 11/12/2017   Acute intractable headache 12/27/2016   Cough 05/27/2016   Dizziness 01/28/2016   Headache 09/29/2015   PAF (paroxysmal atrial fibrillation) ([place]) 09/19/2015   Allergic reaction 03/22/2015   Mastoiditis of right side 03/13/2015   Chest pain, atypical 04/13/2014   Healthcare maintenance 02/16/2013   Elevated CA-125 02/16/2013   IBS (irritable bowel syndrome) 11/25/2010   ANXIETY DISORDER,  SITUATIONAL, MILD 10/20/2009   ALLERGIC RHINITIS, SEASONAL 04/23/2007   GERD 03/10/2007   Insomnia disorder 01/26/2007   HEARING LOSS, LEFT EAR 01/26/2007   Sinusitis, chronic 01/26/2007   Bronchiectasis without acute exacerbation ([place]) 01/26/2007   TMJ SYNDROME 01/26/2007   MENOPAUSAL DISORDER 01/26/2007   OSTEOPOROSIS 01/26/2007   Palpitations 01/26/2007    Past Surgical History:  Procedure Laterality Date   COLONOSCOPY     NASAL SINUS SURGERY     TYMPANOSTOMY     VIDEO BRONCHOSCOPY Bilateral 07/16/2016   Procedure: VIDEO BRONCHOSCOPY WITH FLUORO;  Surgeon: Collene Gobble, MD;  Location: [facility];  Service: Cardiopulmonary;  Laterality: Bilateral;    OB History   No obstetric history on file.      Home Medications    Prior to Admission medications   Medication Sig Start Date End Date Taking? Authorizing Provider  acetaminophen (TYLENOL) 500 MG tablet Take 500 mg by mouth every 6 (six) hours as needed for moderate pain or headache.    [provider]  apixaban (ELIQUIS) 5 MG TABS tablet TAKE 1 TABLET TWICE A DAY 11/20/20   Croitoru, Mihai, MD  Calcium Carb-Cholecalciferol (CALCIUM 600-D PO) Take 1 tablet by mouth every evening.    [provider]  calcium carbonate (TUMS EX) 750 MG chewable tablet Chew  2 tablets by mouth at bedtime.     [provider]  Cholecalciferol (VITAMIN D) 1000 UNITS capsule Take 1,000 Units by mouth daily.    [provider]  COVID-19 mRNA Vac-TriS, Pfizer, (PFIZER-BIONT COVID-19 VAC-TRIS) SUSP injection Inject into the muscle. 09/12/20   Carlyle Basques, MD  denosumab (PROLIA) 60 MG/ML SOSY injection Inject 60 mg into the skin every 6 (six) months.    [provider]  dextromethorphan (DELSYM) 30 MG/5ML liquid Take 30 mg by mouth at bedtime as needed for cough.     [provider]  Estradiol 10 MCG TABS Place 10 mcg vaginally 2 (two) times a week.     [provider]  famotidine  (PEPCID) 40 MG tablet Take 1 tablet (40 mg total) by mouth 2 (two) times daily. Patient taking differently: Take 40 mg by mouth daily. 08/16/19 08/15/20  Ladene Artist, MD  fluticasone (FLONASE) 50 MCG/ACT nasal spray Place 1 spray into both nostrils at bedtime as needed for allergies or rhinitis (seasonal allergies).     [provider]  guaiFENesin (MUCINEX) 600 MG 12 hr tablet Take 600 mg by mouth 2 (two) times daily as needed for cough or to loosen phlegm.     [provider]  Lactobacillus Rhamnosus, GG, (CULTURELLE PO) Take 1 tablet by mouth daily.    [provider]  loratadine (CLARITIN) 10 MG tablet Take 10 mg daily as needed by mouth for allergies.     [provider]  metoprolol tartrate (LOPRESSOR) 25 MG tablet Take 0.5 tablets (12.5 mg total) by mouth as needed. Patient taking differently: Take 12.5 mg by mouth daily as needed (afib episode). 01/11/20   Croitoru, Mihai, MD  Multiple Vitamin (MULTIVITAMIN WITH MINERALS) TABS tablet Take 1 tablet by mouth every evening. Centrum Silver    [provider]  Respiratory Therapy Supplies (FLUTTER) DEVI Twice a day and prn as needed, may increase if feeling worse 01/30/18   Lauraine Rinne, NP    Family History Family History  Problem Relation Age of Onset   Rectal cancer Mother        died age 52   Seizures Mother    Colon cancer Mother 87   Cancer Mother    Heart disease Father        dies age 54   Hypertension Father    Heart disease Maternal Grandfather    AVM Brother    Cancer Maternal Grandmother    Cancer Paternal Uncle        multiple uncles with cancer   Esophageal cancer Neg Hx     Social History Social History   Tobacco Use   Smoking status: Never   Smokeless tobacco: Never  Vaping Use   Vaping Use: Never used  Substance Use Topics   Alcohol use: Not Currently    Alcohol/week: 2.0 standard drinks    Types: 2 Glasses of wine per week   Drug use: No     Allergies    Albuterol, Macrobid [nitrofurantoin macrocrystal], Amoxicillin-pot clavulanate, Avelox [moxifloxacin hcl in nacl], Bactrim [sulfamethoxazole-trimethoprim], Benzonatate, and Ciprofloxacin   Review of Systems Review of Systems   Physical Exam Triage Vital Signs ED Triage Vitals  Enc Vitals Group     BP 01/09/21 1107 122/79     Pulse Rate 01/09/21 1107 80     Resp 01/09/21 1107 18     Temp 01/09/21 1107 98.3 F (36.8 C)     Temp Source 01/09/21 1107 Oral  SpO2 01/09/21 1107 97 %     Weight --      Height --      Head Circumference --      Peak Flow --      Pain Score 01/09/21 1105 1     Pain Loc --      Pain Edu? --      Excl. in [place]? --    No data found.  Updated Vital Signs BP 122/79 (BP Location: Left Arm)   Pulse 80   Temp 98.3 F (36.8 C) (Oral)   Resp 18   SpO2 97%   Visual Acuity Right Eye Distance:   Left Eye Distance:   Bilateral Distance:    Right Eye Near:   Left Eye Near:    Bilateral Near:     Physical Exam Constitutional:      Appearance: Normal appearance. She is not ill-appearing.  Skin:    General: Skin is warm and dry.     Findings: No abscess or bruising.     Comments: There is a small 4 mm lump on the dorsal aspect of right fourth finger over the middle phalanx area.  There is a small 1 mm healed wound, skin now intact, of the skin over the radial aspect of the right fourth finger middle phalanx.  These 2 areas are nontender to palpation.  No evidence of a foreign body  Neurological:     Mental Status: She is alert.     UC Treatments / Results  Labs (all labs ordered are listed, but only abnormal results are displayed) Labs Reviewed - No data to display  EKG   Radiology No results found.  Procedures Procedures (including critical care time)  Medications Ordered in UC Medications - No data to display  Initial Impression / Assessment and Plan / UC Course  I have reviewed the triage vital signs and the nursing  notes.  Pertinent labs & imaging results that were available during my care of the patient were reviewed by me and considered in my medical decision making (see chart for details).  I see no sign of infection in patient's right hand.  I suspect being on Eliquis, patient had significant bleeding at the time of injury 2 weeks ago and there is still some residual blood accumulated under the skin that will resolve with time.  Final Clinical Impressions(s) / UC Diagnoses   Final diagnoses:  Puncture wound of right ring finger  Traumatic hematoma of finger, initial encounter     Discharge Instructions      Continue using warm soaks to help the lump on the top of your ring finger go down.  I suspect this is old, accumulated blood from the injury that is gradually being reabsorbed by your body.  It should go away eventually.  I think your finger is healing well.  If you notice any signs of infection such as redness, swelling, increased pain or pus, please seek immediate attention.   ED Prescriptions   None    PDMP not reviewed this encounter.   Carvel Getting, NP 01/09/21 1200

## 2021-01-09 NOTE — Discharge Instructions (Addendum)
Continue using warm soaks to help the lump on the top of your ring finger go down.  I suspect this is old, accumulated blood from the injury that is gradually being reabsorbed by your body.  It should go away eventually.  I think your finger is healing well.  If you notice any signs of infection such as redness, swelling, increased pain or pus, please seek immediate attention.

## 2021-01-24 NOTE — Telephone Encounter (Signed)
Someone has already changed the appt to 02/12/21 @ 12:40

## 2021-01-24 NOTE — Telephone Encounter (Signed)
I just got a message that my chest CT scan is scheduled for Friday , Oct 7th at 3:00pm. I have a long standing eye appointment scheduled that day at 1:30p and there is not enough time for me  to make a 3:00p appointment. Can you please have someone reschedule this for me?   Thanks, Monica [name]   Please advise

## 2021-02-06 ENCOUNTER — Other Ambulatory Visit: Payer: Self-pay

## 2021-02-06 ENCOUNTER — Other Ambulatory Visit (HOSPITAL_BASED_OUTPATIENT_CLINIC_OR_DEPARTMENT_OTHER): Payer: Self-pay

## 2021-02-06 ENCOUNTER — Ambulatory Visit: Payer: Medicare Other | Attending: Internal Medicine

## 2021-02-06 DIAGNOSIS — Z23 Encounter for immunization: Secondary | ICD-10-CM

## 2021-02-06 MED ORDER — PFIZER COVID-19 VAC BIVALENT 30 MCG/0.3ML IM SUSP
INTRAMUSCULAR | 0 refills | Status: DC
  Filled 2021-02-06: qty 0.3, 1d supply, fill #0

## 2021-02-06 NOTE — Progress Notes (Signed)
   Covid-19 Vaccination Clinic  Name:  LAILIE SMEAD    MRN: [id number] DOB: 10/27/1945  02/06/2021  Ms. Oertel was observed post Covid-19 immunization for 15 minutes without incident. She was provided with Vaccine Information Sheet and instruction to access the V-Safe system.   Ms. Uhde was instructed to call [phone] with any severe reactions post vaccine: Difficulty breathing  Swelling of face and throat  A fast heartbeat  A bad rash all over body  Dizziness and weakness

## 2021-02-08 ENCOUNTER — Ambulatory Visit: Payer: TRICARE For Life (TFL) | Admitting: Cardiovascular Disease

## 2021-02-09 ENCOUNTER — Other Ambulatory Visit: Payer: TRICARE For Life (TFL)

## 2021-02-12 ENCOUNTER — Other Ambulatory Visit: Payer: Self-pay

## 2021-02-12 ENCOUNTER — Ambulatory Visit (INDEPENDENT_AMBULATORY_CARE_PROVIDER_SITE_OTHER): Payer: Medicare Other

## 2021-02-12 ENCOUNTER — Ambulatory Visit
Admission: RE | Admit: 2021-02-12 | Discharge: 2021-02-12 | Disposition: A | Discharge status: Home / Self Care | Payer: Medicare Other | Source: Ambulatory Visit | Attending: Emergency Medicine | Admitting: Emergency Medicine

## 2021-02-12 DIAGNOSIS — J479 Bronchiectasis, uncomplicated: Secondary | ICD-10-CM | POA: Diagnosis not present

## 2021-02-12 DIAGNOSIS — Z23 Encounter for immunization: Secondary | ICD-10-CM

## 2021-02-12 DIAGNOSIS — I700 Atherosclerosis of aorta: Secondary | ICD-10-CM | POA: Diagnosis not present

## 2021-03-15 ENCOUNTER — Other Ambulatory Visit: Payer: Self-pay

## 2021-03-15 ENCOUNTER — Ambulatory Visit (INDEPENDENT_AMBULATORY_CARE_PROVIDER_SITE_OTHER): Payer: Medicare Other | Admitting: Emergency Medicine

## 2021-03-15 ENCOUNTER — Encounter: Payer: Self-pay | Admitting: Emergency Medicine

## 2021-03-15 DIAGNOSIS — J479 Bronchiectasis, uncomplicated: Secondary | ICD-10-CM

## 2021-03-15 DIAGNOSIS — R053 Chronic cough: Secondary | ICD-10-CM

## 2021-03-15 DIAGNOSIS — K219 Gastro-esophageal reflux disease without esophagitis: Secondary | ICD-10-CM | POA: Diagnosis not present

## 2021-03-15 DIAGNOSIS — J301 Allergic rhinitis due to pollen: Secondary | ICD-10-CM

## 2021-03-15 NOTE — Assessment & Plan Note (Signed)
Reviewed her CT chest 02/12/2021.  Extensive bronchiectatic change, surrounding micronodular inflammation.  Her cultures have been negative for [facility], colonized with Pseudomonas.  No significant increase in sputum production, no purulence.  No clear indication for antibiotics or repeat bronchoscopy at this time.  If she does have an increase in sputum then I would like to try and get culture data.

## 2021-03-15 NOTE — Assessment & Plan Note (Signed)
Please continue your nasal saline rinses every day Continue your nasal steroid spray during the fall and spring allergy seasons Continue loratadine 10 mg once daily.

## 2021-03-15 NOTE — Assessment & Plan Note (Signed)
Persistent problem.  Has never been completely controlled even on multiple PPI.  Currently on Pepcid.  Try to do diet modification, keep the head of her bed elevated.  Certainly this is a contributor to her chronic cough.

## 2021-03-15 NOTE — Patient Instructions (Signed)
We reviewed your CT scan of the chest today. Please continue your nasal saline rinses every day Continue your nasal steroid spray during the fall and spring allergy seasons Continue loratadine 10 mg once daily. Agree with Pepcid as you have been taking it.  Try to avoid foods that can cause esophageal reflux. Use your flutter valve 2-3 times daily. If you begin to have increased deep cough and mucus production then notify us.  I would like to send a sputum culture if this happens. Follow Dr. Lamonte Sakai in 2 months or sooner if you have any problems.

## 2021-03-15 NOTE — Assessment & Plan Note (Signed)
Multifactorial.  She has both lower and upper airways cough.  Her bronchiectasis appears to be stable at this time with stable mucus production on her maintenance routine.  She has chronic rhinitis and poorly controlled GERD.  She is been on multiple GERD medications and it has never been fully controlled.  Currently she is on Pepcid.  Plan to try to control all contributing factors as well as possible.  Follow for any flaring of her bronchiectasis as above

## 2021-03-15 NOTE — Progress Notes (Signed)
Subjective:    Patient ID: Monica [name], female    DOB: 02-20-1946, 75 y.o.   MRN: [id number]  HPI  ROV 12/07/20 --this is a follow-up visit for 75 year old woman with history of bronchiectasis, never smoker.  She has upper airway irritation syndrome and chronic cough due to this as well as allergic rhinitis and GERD.  Her methacholine challenge was reassuring 2018. FOB 2018 >> MAIC negative She reports that she had sore throat, some increased cough about 2 weeks ago. She COVID tested negative. Was seen in urgent care, strep negative. Her cough is paroxysmal, often white/yellow mucous.  On loratadine qd, plans to start flonase in the Fall. Uses flutter tid, some mucous. She is doing [place] qd. She tried NaCl nebs but didn't see any benefit.   CXR 11/25/20 reviewed by me, shows chronic changes without any new findings.   ROV 03/15/21 --75 year old woman, never smoker who has a history of chronic cough in the setting of bronchiectasis and also upper airway irritation syndrome, rhinitis and GERD.  No clear evidence for obstructive lung disease (reassuring methacholine challenge).  She has been managed on loratadine, now off Singulair.  She uses nasal steroid during the allergy season.  Nasal saline rinses. Uses flutter valve 2-3x a day, will clear some mucous 30 min after She has been coughing more, last several months. Produces some mucous most days, light yellow. Feels some mucous/irritation in her throat. She is having GERD sx - has not responded to GERD meds well, currently on pepcid.   CT chest 02/12/2021 reviewed by me, shows bilateral tubular bronchiectatic change especially medial right middle lobe and lingula, stable compared with priors.                                                                                                                                                                                                              Review of Systems As per history of present  illness     Objective:   Physical Exam Vitals:   03/15/21 0910  BP: 124/80  Pulse: 85  SpO2: 100%  Weight: 100 lb 9.6 oz (45.6 kg)  Height: 5' 1.5" (1.562 m)   'Gen: Pleasant, thin woman, well-appearing, in no distress,  normal affect  ENT: No lesions,  mouth clear,  oropharynx clear, no postnasal drip  Neck: No JVD, no stridor  Lungs: No use of accessory muscles, clear without rales or rhonchi, no wheeze   Cardiovascular: RRR, heart sounds normal, no murmur or gallops, no peripheral edema  Musculoskeletal:  No deformities, no cyanosis or clubbing  Neuro: alert, non focal  Skin: Warm, no lesions or rashes        Assessment & Plan:  Bronchiectasis without acute exacerbation ([name]) Reviewed her CT chest 02/12/2021.  Extensive bronchiectatic change, surrounding micronodular inflammation.  Her cultures have been negative for [facility], colonized with Pseudomonas.  No significant increase in sputum production, no purulence.  No clear indication for antibiotics or repeat bronchoscopy at this time.  If she does have an increase in sputum then I would like to try and get culture data.  Cough Multifactorial.  She has both lower and upper airways cough.  Her bronchiectasis appears to be stable at this time with stable mucus production on her maintenance routine.  She has chronic rhinitis and poorly controlled GERD.  She is been on multiple GERD medications and it has never been fully controlled.  Currently she is on Pepcid.  Plan to try to control all contributing factors as well as possible.  Follow for any flaring of her bronchiectasis as above    ALLERGIC RHINITIS, SEASONAL Please continue your nasal saline rinses every day Continue your nasal steroid spray during the fall and spring allergy seasons Continue loratadine 10 mg once daily.  GERD Persistent problem.  Has never been completely controlled even on multiple PPI.  Currently on Pepcid.  Try to do diet modification, keep the  head of her bed elevated.  Certainly this is a contributor to her chronic cough.  Baltazar Apo, MD, PhD 03/15/2021, 9:50 AM [facility] [phone] or if no answer [phone]

## 2021-03-19 DIAGNOSIS — M79641 Pain in right hand: Secondary | ICD-10-CM | POA: Diagnosis not present

## 2021-03-19 DIAGNOSIS — M25571 Pain in right ankle and joints of right foot: Secondary | ICD-10-CM | POA: Diagnosis not present

## 2021-03-23 ENCOUNTER — Other Ambulatory Visit: Payer: Self-pay

## 2021-03-23 ENCOUNTER — Ambulatory Visit (INDEPENDENT_AMBULATORY_CARE_PROVIDER_SITE_OTHER): Payer: Medicare Other | Admitting: Cardiovascular Disease

## 2021-03-23 ENCOUNTER — Encounter: Payer: Self-pay | Admitting: Cardiovascular Disease

## 2021-03-23 VITALS — BP 122/74 | HR 79 | Ht 61.05 in | Wt 100.8 lb

## 2021-03-23 DIAGNOSIS — I480 Paroxysmal atrial fibrillation: Secondary | ICD-10-CM | POA: Diagnosis not present

## 2021-03-23 DIAGNOSIS — Z7901 Long term (current) use of anticoagulants: Secondary | ICD-10-CM | POA: Diagnosis not present

## 2021-03-23 NOTE — Patient Instructions (Signed)

## 2021-03-23 NOTE — Progress Notes (Signed)
Cardiology office note   Date:  03/23/2021   ID:  Monica [name], DOB Jan 10, 1946, MRN [id number]   PCP:  Cassandria Anger, MD  Cardiologist:  Sanda Klein, MD  Electrophysiologist:  None   Evaluation Performed:  Follow-Up Visit  Chief Complaint: Atrial fibrillation  History of Present Illness:    Monica [name] is a 75 y.o. female with palpitations related to infrequent episodes of paroxysmal atrial fibrillation as well as PACs and PVCs, without significant underlying structural heart disease.  Over the last 12 months she has had a higher frequency of palpitations.  Her husband passed away almost exactly a year ago and its been a difficult year.  For the most part the palpitations resolved quickly.  By the time she decides to take the metoprolol they stopped almost immediately.  She did have 1 episode of atrial fibrillation that was longer, lasting for about 4 hours.  Overall though, the palpitations are just a nuisance, they do not associate dyspnea, angina or presyncope/syncope.  She has not had any falls, injuries or serious bleeding problems.  She denies angina or dyspnea with activity, orthopnea, PND, lower extremity edema.  She started taking a bisphosphonate for osteoporosis, understanding that there may be a slight increase in atrial fibrillation with this.  Other comorbid conditions include bronchiectasis, gastroesophageal reflux disease, irritable bowel syndrome, osteoporosis, chronic sinusitis and mild anxiety disorder.   Past Medical History:  Diagnosis Date   Adenomatous colon polyp 10/2003   Allergic rhinitis    Allergy    SEASONAL   Bronchiectasis    Chronic sinusitis    Diverticulosis    GERD (gastroesophageal reflux disease)    Hearing loss    Hemorrhoids    Insomnia    Menopausal disorder    Osteoporosis    Palpitations    Paroxysmal A-fib (HCC)    TMJ syndrome    Urticaria    Past Surgical History:  Procedure Laterality Date   COLONOSCOPY      NASAL SINUS SURGERY     TYMPANOSTOMY     VIDEO BRONCHOSCOPY Bilateral 07/16/2016   Procedure: VIDEO BRONCHOSCOPY WITH FLUORO;  Surgeon: Collene Gobble, MD;  Location: [facility];  Service: Cardiopulmonary;  Laterality: Bilateral;     Current Meds  Medication Sig   acetaminophen (TYLENOL) 500 MG tablet Take 500 mg by mouth every 6 (six) hours as needed for moderate pain or headache.   apixaban (ELIQUIS) 5 MG TABS tablet TAKE 1 TABLET TWICE A DAY   Calcium Carb-Cholecalciferol (CALCIUM 600-D PO) Take 1 tablet by mouth every evening.   calcium carbonate (TUMS EX) 750 MG chewable tablet Chew 2 tablets by mouth at bedtime.    Cholecalciferol (VITAMIN D) 1000 UNITS capsule Take 1,000 Units by mouth daily.   COVID-19 mRNA bivalent vaccine, Pfizer, (PFIZER COVID-19 VAC BIVALENT) injection Inject into the muscle.   COVID-19 mRNA Vac-TriS, Pfizer, (PFIZER-BIONT COVID-19 VAC-TRIS) SUSP injection Inject into the muscle.   denosumab (PROLIA) 60 MG/ML SOSY injection Inject 60 mg into the skin every 6 (six) months.   dextromethorphan (DELSYM) 30 MG/5ML liquid Take 30 mg by mouth at bedtime as needed for cough.    Estradiol 10 MCG TABS Place 10 mcg vaginally 2 (two) times a week.    famotidine (PEPCID) 40 MG tablet Take 1 tablet (40 mg total) by mouth 2 (two) times daily. (Patient taking differently: Take 40 mg by mouth daily.)   fluticasone (FLONASE) 50 MCG/ACT nasal spray Place 1 spray into both  nostrils at bedtime as needed for allergies or rhinitis (seasonal allergies).    guaiFENesin (MUCINEX) 600 MG 12 hr tablet Take 600 mg by mouth 2 (two) times daily as needed for cough or to loosen phlegm.    Lactobacillus Rhamnosus, GG, (CULTURELLE PO) Take 1 tablet by mouth daily.   loratadine (CLARITIN) 10 MG tablet Take 10 mg daily as needed by mouth for allergies.    metoprolol tartrate (LOPRESSOR) 25 MG tablet Take 0.5 tablets (12.5 mg total) by mouth as needed. (Patient taking differently: Take 12.5 mg by  mouth daily as needed (afib episode).)   Multiple Vitamin (MULTIVITAMIN WITH MINERALS) TABS tablet Take 1 tablet by mouth every evening. Centrum Silver   Respiratory Therapy Supplies (FLUTTER) DEVI Twice a day and prn as needed, may increase if feeling worse     Allergies:   Albuterol, Macrobid [nitrofurantoin macrocrystal], Amoxicillin-pot clavulanate, Avelox [moxifloxacin hcl in nacl], Bactrim [sulfamethoxazole-trimethoprim], Benzonatate, and Ciprofloxacin   Social History   Tobacco Use   Smoking status: Never   Smokeless tobacco: Never  Vaping Use   Vaping Use: Never used  Substance Use Topics   Alcohol use: Not Currently    Alcohol/week: 2.0 standard drinks    Types: 2 Glasses of wine per week   Drug use: No     Family Hx: The patient's family history includes AVM in her brother; Cancer in her maternal grandmother, mother, and paternal uncle; Colon cancer (age of onset: 12) in her mother; Heart disease in her father and maternal grandfather; Hypertension in her father; Rectal cancer in her mother; Seizures in her mother. There is no history of Esophageal cancer.  ROS:   Please see the history of present illness.     All other systems reviewed and are negative.   Prior CV studies:   The following studies were reviewed today: Event monitor January 2020 showing very brief atrial fibrillation and frequent PACs Echo 2017 shows normal left ventricular systolic function, no valvular abnormalities, normal left atrial size  Labs/Other Tests and Data Reviewed:    EKG: Ordered today and personally reviewed is unchanged from previous tracings, showing normal sinus rhythm, left atrial abnormality, nonspecific T wave changes, normal QTC 444 ms.  Recent Labs: 08/08/2020: Pro B Natriuretic peptide (BNP) 68.0 11/25/2020: BUN 18; Creatinine, Ser 0.69; Hemoglobin 14.7; Platelets 367; Potassium 4.0; Sodium 137   Recent Lipid Panel Lab Results  Component Value Date/Time   CHOL 140  07/06/2018 07:55 AM   TRIG 41.0 07/06/2018 07:55 AM   HDL 62.80 07/06/2018 07:55 AM   CHOLHDL 2 07/06/2018 07:55 AM   LDLCALC 69 07/06/2018 07:55 AM    Wt Readings from Last 3 Encounters:  03/23/21 100 lb 12.8 oz (45.7 kg)  03/15/21 100 lb 9.6 oz (45.6 kg)  12/07/20 101 lb 3.2 oz (45.9 kg)     Objective:    Vital Signs:  BP 122/74   Pulse 79   Ht 5' 1.05" (1.551 m)   Wt 100 lb 12.8 oz (45.7 kg)   SpO2 98%   BMI 19.01 kg/m     General: Alert, oriented x3, no distress, small body habitus and very lean Head: no evidence of trauma, PERRL, EOMI, no exophtalmos or lid lag, no myxedema, no xanthelasma; normal ears, nose and oropharynx Neck: normal jugular venous pulsations and no hepatojugular reflux; brisk carotid pulses without delay and no carotid bruits Chest: clear to auscultation, no signs of consolidation by percussion or palpation, normal fremitus, symmetrical and full respiratory excursions Cardiovascular:  normal position and quality of the apical impulse, regular rhythm, normal first and second heart sounds, no murmurs, rubs or gallops Abdomen: no tenderness or distention, no masses by palpation, no abnormal pulsatility or arterial bruits, normal bowel sounds, no hepatosplenomegaly Extremities: no clubbing, cyanosis or edema; 2+ radial, ulnar and brachial pulses bilaterally; 2+ right femoral, posterior tibial and dorsalis pedis pulses; 2+ left femoral, posterior tibial and dorsalis pedis pulses; no subclavian or femoral bruits Neurological: grossly nonfocal Psych: Normal mood and affect    ASSESSMENT & PLAN:    AFib: The episodes have increased in frequency, but remain only mildly symptomatic.  She is appropriately anticoagulated.  Discussed other options for treatment at length.  She could start taking metoprolol as a scheduled medication and this may lead to some reduction in the amount of symptomatic palpitations.  Also reviewed the pros and cons of true antiarrhythmics.   She could qualify for either flecainide or Multaq, probably with good results since she does not have significant structural heart disease.  Finally talked about the fact that ablation has now moved to a more frontline role for relief of symptoms related to atrial fibrillation.  She will think about these options.  At this point she wishes to stick with conservative treatment with as needed metoprolol. Anticoagulation: Well-tolerated, no bleeding complications.  There are no Patient Instructions on file for this visit.   Signed, Sanda Klein, MD  03/23/2021 11:24 AM    [facility]

## 2021-04-03 DIAGNOSIS — M79641 Pain in right hand: Secondary | ICD-10-CM | POA: Diagnosis not present

## 2021-04-10 DIAGNOSIS — M65321 Trigger finger, right index finger: Secondary | ICD-10-CM | POA: Diagnosis not present

## 2021-04-10 DIAGNOSIS — M79644 Pain in right finger(s): Secondary | ICD-10-CM | POA: Diagnosis not present

## 2021-04-26 DIAGNOSIS — M79644 Pain in right finger(s): Secondary | ICD-10-CM | POA: Diagnosis not present

## 2021-05-21 ENCOUNTER — Encounter: Payer: Self-pay | Admitting: Cardiovascular Disease

## 2021-05-21 ENCOUNTER — Other Ambulatory Visit: Payer: Self-pay | Admitting: Cardiovascular Disease

## 2021-05-21 DIAGNOSIS — I480 Paroxysmal atrial fibrillation: Secondary | ICD-10-CM

## 2021-05-21 NOTE — Telephone Encounter (Signed)
Prescription refill request for Eliquis received. Indication:Afib Last office visit:11/22 Scr:0.6 Age: 76 Weight:45.7 kg  Prescription refilled

## 2021-05-24 ENCOUNTER — Telehealth: Payer: Self-pay | Admitting: Cardiovascular Disease

## 2021-05-24 ENCOUNTER — Other Ambulatory Visit: Payer: Self-pay

## 2021-05-24 ENCOUNTER — Encounter (HOSPITAL_COMMUNITY): Payer: Self-pay | Admitting: Emergency Medicine

## 2021-05-24 ENCOUNTER — Emergency Department (HOSPITAL_COMMUNITY): Payer: Medicare Other

## 2021-05-24 ENCOUNTER — Emergency Department (HOSPITAL_COMMUNITY)
Admission: EM | Admit: 2021-05-24 | Discharge: 2021-05-24 | Disposition: A | Discharge status: Home / Self Care | Payer: Medicare Other | Attending: Emergency Medicine | Admitting: Emergency Medicine

## 2021-05-24 DIAGNOSIS — Z7901 Long term (current) use of anticoagulants: Secondary | ICD-10-CM | POA: Diagnosis not present

## 2021-05-24 DIAGNOSIS — Z20822 Contact with and (suspected) exposure to covid-19: Secondary | ICD-10-CM | POA: Diagnosis not present

## 2021-05-24 DIAGNOSIS — I4891 Unspecified atrial fibrillation: Secondary | ICD-10-CM | POA: Insufficient documentation

## 2021-05-24 DIAGNOSIS — R002 Palpitations: Secondary | ICD-10-CM | POA: Diagnosis present

## 2021-05-24 DIAGNOSIS — Z743 Need for continuous supervision: Secondary | ICD-10-CM | POA: Diagnosis not present

## 2021-05-24 DIAGNOSIS — I499 Cardiac arrhythmia, unspecified: Secondary | ICD-10-CM | POA: Diagnosis not present

## 2021-05-24 DIAGNOSIS — R000 Tachycardia, unspecified: Secondary | ICD-10-CM | POA: Diagnosis not present

## 2021-05-24 DIAGNOSIS — J479 Bronchiectasis, uncomplicated: Secondary | ICD-10-CM | POA: Diagnosis not present

## 2021-05-24 LAB — RESP PANEL BY RT-PCR (FLU A&B, COVID) ARPGX2
Influenza A by PCR: NEGATIVE
Influenza B by PCR: NEGATIVE
SARS Coronavirus 2 by RT PCR: NEGATIVE

## 2021-05-24 LAB — CBC WITH DIFFERENTIAL/PLATELET
Abs Immature Granulocytes: 0.06 10*3/uL (ref 0.00–0.07)
Basophils Absolute: 0.1 10*3/uL (ref 0.0–0.1)
Basophils Relative: 1 %
Eosinophils Absolute: 0.2 10*3/uL (ref 0.0–0.5)
Eosinophils Relative: 1 %
HCT: 47.5 % — ABNORMAL HIGH (ref 36.0–46.0)
Hemoglobin: 15.5 g/dL — ABNORMAL HIGH (ref 12.0–15.0)
Immature Granulocytes: 0 %
Lymphocytes Relative: 10 %
Lymphs Abs: 1.5 10*3/uL (ref 0.7–4.0)
MCH: 30.9 pg (ref 26.0–34.0)
MCHC: 32.6 g/dL (ref 30.0–36.0)
MCV: 94.6 fL (ref 80.0–100.0)
Monocytes Absolute: 1.3 10*3/uL — ABNORMAL HIGH (ref 0.1–1.0)
Monocytes Relative: 9 %
Neutro Abs: 11.8 10*3/uL — ABNORMAL HIGH (ref 1.7–7.7)
Neutrophils Relative %: 79 %
Platelets: 508 10*3/uL — ABNORMAL HIGH (ref 150–400)
RBC: 5.02 MIL/uL (ref 3.87–5.11)
RDW: 12.3 % (ref 11.5–15.5)
WBC: 15 10*3/uL — ABNORMAL HIGH (ref 4.0–10.5)
nRBC: 0 % (ref 0.0–0.2)

## 2021-05-24 LAB — COMPREHENSIVE METABOLIC PANEL
ALT: 16 U/L (ref 0–44)
AST: 17 U/L (ref 15–41)
Albumin: 3.6 g/dL (ref 3.5–5.0)
Alkaline Phosphatase: 72 U/L (ref 38–126)
Anion gap: 10 (ref 5–15)
BUN: 21 mg/dL (ref 8–23)
CO2: 27 mmol/L (ref 22–32)
Calcium: 9.5 mg/dL (ref 8.9–10.3)
Chloride: 101 mmol/L (ref 98–111)
Creatinine, Ser: 0.82 mg/dL (ref 0.44–1.00)
GFR, Estimated: >60 mL/min (ref >60)
Glucose, Bld: 95 mg/dL (ref 70–99)
Potassium: 3.9 mmol/L (ref 3.5–5.1)
Sodium: 138 mmol/L (ref 135–145)
Total Bilirubin: 0.7 mg/dL (ref 0.3–1.2)
Total Protein: 6.9 g/dL (ref 6.5–8.1)

## 2021-05-24 LAB — TSH: TSH: 3.132 u[IU]/mL (ref 0.350–4.500)

## 2021-05-24 LAB — T4, FREE: Free T4: 1.26 ng/dL — ABNORMAL HIGH (ref 0.61–1.12)

## 2021-05-24 LAB — TROPONIN I (HIGH SENSITIVITY)
Troponin I (High Sensitivity): 9 ng/L (ref <18)
Troponin I (High Sensitivity): 10 ng/L (ref <18)

## 2021-05-24 LAB — MAGNESIUM: Magnesium: 2.4 mg/dL (ref 1.7–2.4)

## 2021-05-24 MED ORDER — SODIUM CHLORIDE 0.9 % IV BOLUS
1000.0000 mL | Freq: Once | INTRAVENOUS | Status: AC
  Administered 2021-05-24: 1000 mL via INTRAVENOUS

## 2021-05-24 MED ORDER — METOPROLOL TARTRATE 25 MG PO TABS: 25.0000 mg | ORAL_TABLET | Freq: Two times a day (BID) | ORAL | 0 refills | Status: DC

## 2021-05-24 MED ORDER — METOPROLOL TARTRATE 5 MG/5ML IV SOLN: 5.0000 mg | Freq: Once | INTRAVENOUS | Status: DC

## 2021-05-24 NOTE — Telephone Encounter (Addendum)
Spoke with patient who reports being in afib and went to the ed. While there, she self-converted to NSR. The ED physician consulted with Dr. Gasper Sells who recommended the patient take metoprolol tartrate 25 mg twice daily. Pt is concerned about bp dropping with that dosage. Her last bp was 120/70. Please advise on medication. Late entry 4:14 pm. Patient asked whether or not she should take the 25 mg dose in the morning. I suggested she check her bp before taking it. If her sbp is 100 or less to hold the dose and contact the clinic. If she takes the 25 mg, she is to check her bp 1/2 hour later. She voiced understanding of this conversation.

## 2021-05-24 NOTE — Discharge Instructions (Addendum)
It was a pleasure caring for you today in the emergency department.  Please follow up with cardiology/AFIB clinic, take metoprolol 25mg  twice daily. Continue eliquis.   Follow-up with your PCP regarding thyroid levels, slightly elevated Free T4  Please return to the emergency department for any worsening or worrisome symptoms.

## 2021-05-24 NOTE — Telephone Encounter (Signed)
Patient called into the office. Patient states she thinks she is afib @ 6 hours now . Heart is between 120 - 150 . Patient has a  Chad monitor ,which she states how she obtain her heart rate..  She states she took  12.5 mg of metoprolol @ 2:25 am this morning and  rhythm has not broke yet. She states she has not gotten any sleep. RN  recommend patient to go to [facility] ER for evaluation  and treatment. Patient verbalized understanding. Patient states she will call EMS for transport-

## 2021-05-24 NOTE — Telephone Encounter (Signed)
Patient calling in to discuss on ER visit. Please advise

## 2021-05-24 NOTE — Telephone Encounter (Signed)
Patient c/o Palpitations:  High priority if patient c/o lightheadedness, shortness of breath, or chest pain  How long have you had palpitations/irregular HR/ Afib? Are you having the symptoms now?  Patient states she has been in afib for the past 5.5 hours. Her HR is in the 150's and she states she hasn't taken her BP. Denies having any other symptoms  Are you currently experiencing lightheadedness, SOB or CP?  No   Do you have a history of afib (atrial fibrillation) or irregular heart rhythm?  Yes   Have you checked your BP or HR? (document readings if available):  HR 150's  Are you experiencing any other symptoms?  No

## 2021-05-24 NOTE — ED Provider Notes (Addendum)
[facility] EMERGENCY DEPARTMENT Provider Note   CSN: [id number] Arrival date & time: 05/24/21  6440     History  Chief Complaint  Patient presents with   Atrial Fibrillation    Monica [name] is a 76 y.o. female.  This is a 76 y.o. female with significant medical history as below, including paroxysmal atrial fibrillation on Eliquis, bronchiectasis who presents to the ED with complaint of palpitations, atrial fibrillation. patient describes onset of symptoms approximately 2 AM this morning.  History of palpitations, heart fluttering.  She took 12.5 mg of oral metoprolol around 2 AM which did not improve her symptoms.  She called cardiologist office around 8 AM regarding recommendations and advised her to come to the ER for evaluation.  Patient has no chest pain, lightheadedness, diaphoresis, nausea vomiting.  No leg swelling.  Patient is experience episodes of atrial fibrillation in the past with typically they resolve after medication or spontaneously.  She is being evaluated for possible ablation in the future for her A. Fib. She has been compliant with her Eliquis, last dose was this morning.  No other acute medical complaints were offered.  Cardiologist Dr. Sanda Klein, MD      Past Medical History: 10/2003: Adenomatous colon polyp No date: Allergic rhinitis No date: Allergy     Comment:  SEASONAL No date: Bronchiectasis No date: Chronic sinusitis No date: Diverticulosis No date: GERD (gastroesophageal reflux disease) No date: Hearing loss No date: Hemorrhoids No date: Insomnia No date: Menopausal disorder No date: Osteoporosis No date: Palpitations No date: Paroxysmal A-fib (HCC) No date: TMJ syndrome No date: Urticaria     The history is provided by the patient. No language interpreter was used.  Atrial Fibrillation Pertinent negatives include no chest pain, no abdominal pain, no headaches and no shortness of breath.      Home  Medications Prior to Admission medications   Medication Sig Start Date End Date Taking? Authorizing Provider  acetaminophen (TYLENOL) 500 MG tablet Take 500 mg by mouth every 6 (six) hours as needed for moderate pain or headache.   Yes [provider]  calcium carbonate (TUMS EX) 750 MG chewable tablet Chew 1,500 mg by mouth at bedtime.   Yes [provider]  CALCIUM-MAGNESIUM-ZINC PO Take 1 tablet by mouth daily.   Yes [provider]  Cholecalciferol (VITAMIN D) 1000 UNITS capsule Take 1,000 Units by mouth daily.   Yes [provider]  dextromethorphan (DELSYM) 30 MG/5ML liquid Take 30 mg by mouth at bedtime as needed for cough.    Yes [provider]  ELIQUIS 5 MG TABS tablet TAKE 1 TABLET TWICE A DAY Patient taking differently: Take 5 mg by mouth 2 (two) times daily. 05/21/21  Yes Croitoru, Mihai, MD  Estradiol 10 MCG TABS Place 10 mcg vaginally 3 (three) times a week.   Yes [provider]  famotidine (PEPCID) 40 MG tablet Take 1 tablet (40 mg total) by mouth 2 (two) times daily. Patient taking differently: Take 40 mg by mouth at bedtime. 08/16/19 07/14/21 Yes Ladene Artist, MD  Lactobacillus Rhamnosus, GG, (CULTURELLE PO) Take 1 tablet by mouth daily.   Yes [provider]  metoprolol tartrate (LOPRESSOR) 25 MG tablet Take 1 tablet (25 mg total) by mouth 2 (two) times daily for 14 days. 05/24/21 06/07/21 Yes Jeanell Sparrow, DO  Multiple Vitamin (MULTIVITAMIN WITH MINERALS) TABS tablet Take 1 tablet by mouth every evening. Centrum Silver   Yes [provider]  Respiratory Therapy Supplies (FLUTTER) DEVI Twice a day and prn as needed, may increase if feeling worse 01/30/18   Lauraine Rinne, NP      Allergies    Albuterol, Macrobid [nitrofurantoin macrocrystal], Amoxicillin-pot clavulanate, Avelox [moxifloxacin hcl in nacl], Bactrim [sulfamethoxazole-trimethoprim], Benzonatate, and Ciprofloxacin    Review of Systems   Review  of Systems  Constitutional:  Positive for fatigue. Negative for activity change and fever.  HENT:  Negative for facial swelling and trouble swallowing.   Eyes:  Negative for discharge and redness.  Respiratory:  Negative for cough and shortness of breath.   Cardiovascular:  Positive for palpitations. Negative for chest pain.  Gastrointestinal:  Negative for abdominal pain and nausea.  Genitourinary:  Negative for dysuria and flank pain.  Musculoskeletal:  Negative for back pain and gait problem.  Skin:  Negative for pallor and rash.  Neurological:  Negative for syncope and headaches.   Physical Exam Updated Vital Signs BP 130/63    Pulse 77    Temp 98.1 F (36.7 C) (Oral)    Resp 16    Ht 5' 1.5" (1.562 m)    Wt 45.4 kg    SpO2 100%    BMI 18.59 kg/m  Physical Exam Vitals and nursing note reviewed.  Constitutional:      General: She is not in acute distress.    Appearance: Normal appearance. She is not ill-appearing.  HENT:     Head: Normocephalic and atraumatic.     Right Ear: External ear normal.     Left Ear: External ear normal.     Nose: Nose normal.     Mouth/Throat:     Mouth: Mucous membranes are moist.  Eyes:     General: No scleral icterus.       Right eye: No discharge.        Left eye: No discharge.  Cardiovascular:     Rate and Rhythm: Tachycardia present. Rhythm irregular.     Pulses: Normal pulses.     Heart sounds: Normal heart sounds.  Pulmonary:     Effort: Pulmonary effort is normal. No respiratory distress.     Breath sounds: Normal breath sounds.  Abdominal:     General: Abdomen is flat.     Palpations: Abdomen is soft.     Tenderness: There is no abdominal tenderness.  Musculoskeletal:        General: Normal range of motion.     Cervical back: Normal range of motion.     Right lower leg: No edema.     Left lower leg: No edema.  Skin:    General: Skin is warm and dry.     Capillary Refill: Capillary refill takes less than 2 seconds.   Neurological:     Mental Status: She is alert.  Psychiatric:        Mood and Affect: Mood normal.        Behavior: Behavior normal.    ED Results / Procedures / Treatments   Labs (all labs ordered are listed, but only abnormal results are displayed) Labs Reviewed  CBC WITH DIFFERENTIAL/PLATELET - Abnormal; Notable for the following components:      Result Value   WBC 15.0 (*)    Hemoglobin 15.5 (*)    HCT 47.5 (*)    Platelets 508 (*)    Neutro Abs 11.8 (*)    Monocytes Absolute 1.3 (*)    All other components within normal limits  T4, FREE - Abnormal; Notable for  the following components:   Free T4 1.26 (*)    All other components within normal limits  RESP PANEL BY RT-PCR (FLU A&B, COVID) ARPGX2  COMPREHENSIVE METABOLIC PANEL  TSH  MAGNESIUM  TROPONIN I (HIGH SENSITIVITY)  TROPONIN I (HIGH SENSITIVITY)    EKG EKG Interpretation  Date/Time:  Thursday May 24 2021 09:47:18 EST Ventricular Rate:  88 PR Interval:  143 QRS Duration: 78 QT Interval:  316 QTC Calculation: 383 R Axis:   55 Text Interpretation: Sinus rhythm Biatrial enlargement Probable anteroseptal infarct, old Confirmed by Wynona Dove ([id number]) on 05/24/2021 9:54:27 AM  Radiology DG Chest Port 1 View  Result Date: 05/24/2021 CLINICAL DATA:  76 year old female with atrial fibrillation. EXAM: PORTABLE CHEST 1 VIEW COMPARISON:  Chest CT 02/12/2021 and earlier. FINDINGS: Portable AP upright view at 0936 hours. Stable lung volumes with mild hyperinflation and chronic scarring at the right lung base tenting the inferior pulmonary ligament. Widespread lower lung predominant bronchiectasis and inflammatory opacity seen last year appears stable to regressed. No superimposed pneumothorax, pulmonary edema or new pulmonary opacity. No acute osseous abnormality identified. Negative visible bowel gas. IMPRESSION: 1. 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 2. No new cardiopulmonary abnormality. Electronically Signed   By: Genevie Ann M.D.   On: 05/24/2021 10:09    Procedures .Critical Care Performed by: Jeanell Sparrow, DO Authorized by: Jeanell Sparrow, DO   Critical care provider statement:    Critical care time (minutes):  30   Critical care time was exclusive of:  Teaching time   Critical care was necessary to treat or prevent imminent or life-threatening deterioration of the following conditions:  Cardiac failure   Critical care was time spent personally by me on the following activities:  Development of treatment plan with patient or surrogate, discussions with consultants, evaluation of patient's response to treatment, examination of patient, ordering and review of laboratory studies, ordering and review of radiographic studies, ordering and performing treatments and interventions, pulse oximetry, re-evaluation of patient's condition and review of old charts Comments:     Afib with RVR    Medications Ordered in ED Medications  sodium chloride 0.9 % bolus 1,000 mL (0 mLs Intravenous Stopped 05/24/21 1158)    ED Course/ Medical Decision Making/ A&P                           Medical Decision Making Amount and/or Complexity of Data Reviewed Labs: ordered. Radiology: ordered.  Risk Prescription drug management.    CC: palpitations/afib  This patient complains of above; this involves an extensive number of treatment options and is a complaint that carries with it a high risk of complications and morbidity. Vital signs were reviewed. Serious etiologies considered.  She is hemodynamically stable  Record review:   Previous records obtained and reviewed    Work up as above, notable for:  Labs & imaging results that were available during my care of the patient were reviewed by me and considered in my medical decision making.   I ordered imaging studies which included CXR and I independently  visualized and interpreted imaging which showed chronic changes including improvement of pneumonia.  Otherwise no acute changes  Cardiac monitoring reviewed and interpreted personally which shows afib with RVR  EKG with atrial fibrillation with RVR, rate 140s.   Management: Favor cardioversion as patient has been symptomatic for approximately 8 hours,  she is anticoagulated.  No history of structural heart disease.  She did take beta-blocker prior to arrival.  Discussed with her cardiologist.   Reassessment:  Patient has spontaneously converted to normal sinus rhythm.  She is remained in normal sinus rhythm since the initial conversion.  Hemodynamically stable.  Symptoms greatly improved.  Discussed with cardiology Dr. Gasper Sells who recommends increasing beta-blocker to 25 mg twice daily metoprolol.  Discussed this with patient, she is agreeable.   She is anticoagulant Eliquis, advised her to continue   The patient improved significantly and was discharged in stable condition. Detailed discussions were had with the patient regarding current findings, and need for close f/u with PCP or on call doctor. The patient has been instructed to return immediately if the symptoms worsen in any way for re-evaluation. Patient verbalized understanding and is in agreement with current care plan. All questions answered prior to discharge.           This chart was dictated using voice recognition software.  Despite best efforts to proofread,  errors can occur which can change the documentation meaning.         Final Clinical Impression(s) / ED Diagnoses Final diagnoses:  Atrial fibrillation with RVR ([place])    Rx / DC Orders ED Discharge Orders          Ordered    metoprolol tartrate (LOPRESSOR) 25 MG tablet  2 times daily        05/24/21 1255              Jeanell Sparrow, DO 05/24/21 1304    Jeanell Sparrow, DO 05/24/21 1305

## 2021-05-24 NOTE — ED Triage Notes (Signed)
Pt from home with hx of afib, on eliquis for same. Woke up this morning around 0215 with central chest pain/heart racing, checked her pulse and it was around 150. She then took 12.5 mg metoprolol but has remained in afib. Has recently finished a course of prednisone for L hand swelling.

## 2021-05-25 ENCOUNTER — Encounter: Payer: Self-pay | Admitting: Cardiovascular Disease

## 2021-05-29 ENCOUNTER — Ambulatory Visit (INDEPENDENT_AMBULATORY_CARE_PROVIDER_SITE_OTHER): Payer: Medicare Other | Admitting: Emergency Medicine

## 2021-05-29 ENCOUNTER — Encounter (HOSPITAL_COMMUNITY): Payer: Self-pay | Admitting: Physician Assistant

## 2021-05-29 ENCOUNTER — Ambulatory Visit (HOSPITAL_COMMUNITY)
Admission: RE | Admit: 2021-05-29 | Discharge: 2021-05-29 | Disposition: A | Discharge status: Home / Self Care | Payer: Medicare Other | Source: Ambulatory Visit | Attending: Physician Assistant | Admitting: Physician Assistant

## 2021-05-29 ENCOUNTER — Other Ambulatory Visit: Payer: Self-pay

## 2021-05-29 ENCOUNTER — Encounter: Payer: Self-pay | Admitting: Emergency Medicine

## 2021-05-29 VITALS — BP 114/78 | HR 93 | Ht 61.5 in | Wt 99.2 lb

## 2021-05-29 DIAGNOSIS — J301 Allergic rhinitis due to pollen: Secondary | ICD-10-CM

## 2021-05-29 DIAGNOSIS — K219 Gastro-esophageal reflux disease without esophagitis: Secondary | ICD-10-CM

## 2021-05-29 DIAGNOSIS — I480 Paroxysmal atrial fibrillation: Secondary | ICD-10-CM | POA: Insufficient documentation

## 2021-05-29 DIAGNOSIS — I4891 Unspecified atrial fibrillation: Secondary | ICD-10-CM | POA: Diagnosis present

## 2021-05-29 DIAGNOSIS — Z7901 Long term (current) use of anticoagulants: Secondary | ICD-10-CM | POA: Diagnosis not present

## 2021-05-29 DIAGNOSIS — D6869 Other thrombophilia: Secondary | ICD-10-CM | POA: Diagnosis not present

## 2021-05-29 DIAGNOSIS — J479 Bronchiectasis, uncomplicated: Secondary | ICD-10-CM

## 2021-05-29 MED ORDER — METOPROLOL TARTRATE 25 MG PO TABS: 25.0000 mg | ORAL_TABLET | Freq: Two times a day (BID) | ORAL | 3 refills | Status: DC

## 2021-05-29 NOTE — Assessment & Plan Note (Signed)
Moderate to poor control but she has been refractory to meds.  She does modify her diet.  She is on Pepcid.  Certainly a contributor to her chronic cough.

## 2021-05-29 NOTE — Assessment & Plan Note (Signed)
Stable cough and dyspnea.  Scant mucus production but she does cough every day.  We not obtained on culture data recently.  Discussed with her the possible role of bronchoscopy going forward.  If your cough and mucus production increase then we would like to try and perform cultures. We can talk about any role for bronchoscopy to obtain cultures at your next visit. We will plan the timing of repeat CT chest in next visit. Follow with Dr Lamonte Sakai in 6 months or sooner if you have any problems

## 2021-05-29 NOTE — Progress Notes (Signed)
Subjective:    Patient ID: Monica [name], female    DOB: 1945/05/13, 76 y.o.   MRN: [id number]  HPI  ROV 03/15/21 --76 year old woman, never smoker who has a history of chronic cough in the setting of bronchiectasis and also upper airway irritation syndrome, rhinitis and GERD.  No clear evidence for obstructive lung disease (reassuring methacholine challenge).  She has been managed on loratadine, now off Singulair.  She uses nasal steroid during the allergy season.  Nasal saline rinses. Uses flutter valve 2-3x a day, will clear some mucous 30 min after She has been coughing more, last several months. Produces some mucous most days, light yellow. Feels some mucous/irritation in her throat. She is having GERD sx - has not responded to GERD meds well, currently on pepcid.   CT chest 02/12/2021 reviewed by me, shows bilateral tubular bronchiectatic change especially medial right middle lobe and lingula, stable compared with priors.  ROV 05/29/21 --Monica [name] is 76, follows today for her history bronchiectasis and associated chronic cough.  Some of this is upper airway irritation syndrome in the setting of rhinitis and GERD.  No evidence for obstructive disease on methacholine challenge.  She is colonized with Pseudomonas, has had no evidence for [facility] in the past.  Remains on loratadine, nasal steroid during the allergy season, Pepcid She was seen in the emergency department 05/24/2021 with palpitations, was having more labile A Fib. She is planning to see EP.  She has stable, daily cough. Some production but she is unable to cough up completely, looks yellow. She has persistent GERD even on pepcid. Other meds have been ineffective. We do not have any sputum samples, last FOB was several yrs ago.                                                                                                                                                                                                                Review of Systems As per history of present illness     Objective:   Physical Exam Vitals:   05/29/21 0857  BP: 124/70  Pulse: (!) 106  Temp: 97.7 F (36.5 C)  TempSrc: Oral  SpO2: 99%  Weight: 97 lb 3.2 oz (44.1 kg)  Height: 5' 1.5" (1.562 m)   'Gen: Pleasant, thin woman, well-appearing, in no distress,  normal affect  ENT: No lesions,  mouth clear,  oropharynx clear, no postnasal drip  Neck: No JVD, no stridor  Lungs: No use of accessory muscles, clear without rales or rhonchi,  no wheeze   Cardiovascular: RRR, heart sounds normal, no murmur or gallops, no peripheral edema  Musculoskeletal: No deformities, no cyanosis or clubbing  Neuro: alert, non focal  Skin: Warm, no lesions or rashes        Assessment & Plan:  Bronchiectasis without acute exacerbation (HCC) Stable cough and dyspnea.  Scant mucus production but she does cough every day.  We not obtained on culture data recently.  Discussed with her the possible role of bronchoscopy going forward.  If your cough and mucus production increase then we would like to try and perform cultures. We can talk about any role for bronchoscopy to obtain cultures at your next visit. We will plan the timing of repeat CT chest in next visit. Follow with Dr Lamonte Sakai in 6 months or sooner if you have any problems  ALLERGIC RHINITIS, SEASONAL Please continue loratadine as you have been taking it Start your nasal steroid spray this spring as planned.  You should probably be using it during the spring and fall months.  GERD Moderate to poor control but she has been refractory to meds.  She does modify her diet.  She is on Pepcid.  Certainly a contributor to her chronic cough.  Baltazar Apo, MD, PhD 05/29/2021, 9:16 AM [facility] [phone] or if no answer [phone]

## 2021-05-29 NOTE — Assessment & Plan Note (Signed)
Please continue loratadine as you have been taking it Start your nasal steroid spray this spring as planned.  You should probably be using it during the spring and fall months.

## 2021-05-29 NOTE — Patient Instructions (Signed)
Please continue your Pepcid as you have been taking it Please continue loratadine as you have been taking it Start your nasal steroid spray this spring as planned.  You should probably be using it during the spring and fall months. If your cough and mucus production increase then we would like to try and perform cultures. We can talk about any role for bronchoscopy to obtain cultures at your next visit. We will plan the timing of repeat CT chest in next visit. Follow with Dr Lamonte Sakai in 6 months or sooner if you have any problems

## 2021-05-29 NOTE — Progress Notes (Signed)
Primary Care Physician: Cassandria Anger, MD Primary Cardiologist: Dr Sallyanne Kuster  Primary Electrophysiologist: Dr Quentin Ore (new) Referring Physician: [facility]   Monica [name] is a 76 y.o. female with a history of bronchiectasis and atrial fibrillation who presents for consultation in the [facility]. Patient is on Eliquis for a CHADS2VASC score of 3. She has been maintained on PRN BB but was seen at the ED 05/24/21 for a prolonged episode of heart racing. The episodes started around 2 AM and she took her PRN BB without relief. She went the ED and ECG showed afib with RVR. She spontaneously converted and was started on scheduled metoprolol. She has not had any interim episodes of afib.   Today, she denies symptoms of palpitations, chest pain, shortness of breath, orthopnea, PND, lower extremity edema, dizziness, presyncope, syncope, snoring, daytime somnolence, bleeding, or neurologic sequela. The patient is tolerating medications without difficulties and is otherwise without complaint today.    Atrial Fibrillation Risk Factors:  she does not have symptoms or diagnosis of sleep apnea. she does not have a history of rheumatic fever.   she has a BMI of Body mass index is 18.44 kg/m.Marland Kitchen Filed Weights   05/29/21 1316  Weight: 45 kg    Family History  Problem Relation Age of Onset   Rectal cancer Mother        died age 22   Seizures Mother    Colon cancer Mother 49   Cancer Mother    Heart disease Father        dies age 56   Hypertension Father    Heart disease Maternal Grandfather    AVM Brother    Cancer Maternal Grandmother    Cancer Paternal Uncle        multiple uncles with cancer   Esophageal cancer Neg Hx      Atrial Fibrillation Management history:  Previous antiarrhythmic drugs: none Previous cardioversions: none Previous ablations: none CHADS2VASC score: 3 Anticoagulation history: Eliquis   Past Medical History:  Diagnosis  Date   Adenomatous colon polyp 10/2003   Allergic rhinitis    Allergy    SEASONAL   Bronchiectasis    Chronic sinusitis    Diverticulosis    GERD (gastroesophageal reflux disease)    Hearing loss    Hemorrhoids    Insomnia    Menopausal disorder    Osteoporosis    Palpitations    Paroxysmal A-fib (HCC)    TMJ syndrome    Urticaria    Past Surgical History:  Procedure Laterality Date   COLONOSCOPY     NASAL SINUS SURGERY     TYMPANOSTOMY     VIDEO BRONCHOSCOPY Bilateral 07/16/2016   Procedure: VIDEO BRONCHOSCOPY WITH FLUORO;  Surgeon: Collene Gobble, MD;  Location: [facility];  Service: Cardiopulmonary;  Laterality: Bilateral;    Current Outpatient Medications  Medication Sig Dispense Refill   acetaminophen (TYLENOL) 500 MG tablet Take 500 mg by mouth every 6 (six) hours as needed for moderate pain or headache.     calcium carbonate (TUMS EX) 750 MG chewable tablet Chew 1,500 mg by mouth at bedtime.     CALCIUM-MAGNESIUM-ZINC PO Take 1 tablet by mouth daily.     Cholecalciferol (VITAMIN D) 1000 UNITS capsule Take 1,000 Units by mouth daily.     dextromethorphan (DELSYM) 30 MG/5ML liquid Take 30 mg by mouth at bedtime as needed for cough.      ELIQUIS 5 MG TABS tablet TAKE  1 TABLET TWICE A DAY 180 tablet 3   Estradiol 10 MCG TABS Place 10 mcg vaginally 3 (three) times a week.     famotidine (PEPCID) 40 MG tablet Take 1 tablet (40 mg total) by mouth 2 (two) times daily. 60 tablet 11   Lactobacillus Rhamnosus, GG, (CULTURELLE PO) Take 1 tablet by mouth daily.     Multiple Vitamin (MULTIVITAMIN WITH MINERALS) TABS tablet Take 1 tablet by mouth every evening. Centrum Silver     Respiratory Therapy Supplies (FLUTTER) DEVI Twice a day and prn as needed, may increase if feeling worse 1 each 0   metoprolol tartrate (LOPRESSOR) 25 MG tablet Take 1 tablet (25 mg total) by mouth 2 (two) times daily. 60 tablet 3   No current facility-administered medications for this encounter.     Allergies  Allergen Reactions   Albuterol Other (See Comments)    Patient states put her into atrial fib last time she took this medication   Macrobid [Nitrofurantoin Macrocrystal]     Diarrhea, nausea   Amoxicillin-Pot Clavulanate Diarrhea    Has patient had a PCN reaction causing immediate rash, facial/tongue/throat swelling, SOB or lightheadedness with hypotension:No Has patient had a PCN reaction causing severe rash involving mucus membranes or skin necrosis:No Has patient had a PCN reaction that required hospitalization:No Has patient had a PCN reaction occurring within the last 10 years:Yes If all of the above answers are "NO", then may proceed with Cephalosporin use   Avelox [Moxifloxacin Hcl In Nacl] Nausea Only   Bactrim [Sulfamethoxazole-Trimethoprim] Other (See Comments)    flushing   Benzonatate Other (See Comments)    felt bad, out of sorts, disoriented.   Ciprofloxacin Rash    Rash across abdomen    Social History   Socioeconomic History   Marital status: Widowed    Spouse name: [name]   Number of children: 0   Years of education: BSN   Highest education level: Not on file  Occupational History   Occupation: retired Therapist, sports  Tobacco Use   Smoking status: Never   Smokeless tobacco: Never  Vaping Use   Vaping Use: Never used  Substance and Sexual Activity   Alcohol use: Yes    Alcohol/week: 1.0 - 2.0 standard drink    Types: 1 - 2 Glasses of wine per week    Comment: 1-2 glasses of wine a month 05/29/21   Drug use: No   Sexual activity: Not Currently  Other Topics Concern   Not on file  Social History Narrative   ** Merged History Encounter **       Lives with husband Caffeine use: 1 cup tea per day   Social Determinants of Health   Financial Resource Strain: Not on file  Food Insecurity: Not on file  Transportation Needs: Not on file  Physical Activity: Not on file  Stress: Not on file  Social Connections: Not on file  Intimate Partner Violence:  Not on file     ROS- All systems are reviewed and negative except as per the HPI above.  Physical Exam: Vitals:   05/29/21 1316  BP: 114/78  Pulse: 93  Weight: 45 kg  Height: 5' 1.5" (1.562 m)    GEN- The patient is a well appearing elderly female, alert and oriented x 3 today.   Head- normocephalic, atraumatic Eyes-  Sclera clear, conjunctiva pink Ears- hearing intact Oropharynx- clear Neck- supple  Lungs- Clear to ausculation bilaterally, normal work of breathing Heart- Regular rate and rhythm, no murmurs,  rubs or gallops  GI- soft, NT, ND, + BS Extremities- no clubbing, cyanosis, or edema MS- no significant deformity or atrophy Skin- no rash or lesion Psych- euthymic mood, full affect Neuro- strength and sensation are intact  Wt Readings from Last 3 Encounters:  05/29/21 45 kg  05/29/21 44.1 kg  05/24/21 45.4 kg    EKG today demonstrates  SR Vent. rate 93 BPM PR interval 162 ms QRS duration 72 ms QT/QTcB 334/415 ms  Echo 10/06/15 demonstrated  Left ventricle: The cavity size was normal. Systolic function was    normal. The estimated ejection fraction was in the range of 55%    to 60%. Wall motion was normal; there were no regional wall    motion abnormalities. Doppler parameters are consistent with    abnormal left ventricular relaxation (grade 1 diastolic    dysfunction). There was no evidence of elevated ventricular    filling pressure by Doppler parameters.  - Aortic valve: Trileaflet; normal thickness leaflets. There was no    regurgitation.  - Aortic root: The aortic root was normal in size.  - Mitral valve: There was no regurgitation.  - Left atrium: The atrium was normal in size.  - Right ventricle: Systolic function was normal.  - Right atrium: The atrium was normal in size.  - Tricuspid valve: There was trivial regurgitation.  - Pulmonic valve: There was trivial regurgitation.  - Pulmonary arteries: Systolic pressure was within the normal     range.  - Inferior vena cava: The vessel was normal in size.  - Pericardium, extracardiac: There was no pericardial effusion.   Epic records are reviewed at length today  CHA2DS2-VASc Score = 3  The patient's score is based upon: CHF History: 0 HTN History: 0 Diabetes History: 0 Stroke History: 0 Vascular Disease History: 0 Age Score: 2 Gender Score: 1       ASSESSMENT AND PLAN: 1. Paroxysmal Atrial Fibrillation (ICD10:  I48.0) The patient's CHA2DS2-VASc score is 3, indicating a 3.2% annual risk of stroke.   Patient in [place].  We discussed rhythm control options. She would like to avoid long term medications if possible. She already has an appointment with Dr Quentin Ore to discuss possible ablation.   Continue Lopressor 25 mg BID Continue Eliquis 5 mg BID  2. Secondary Hypercoagulable State (ICD10:  D68.69) The patient is at significant risk for stroke/thromboembolism based upon her CHA2DS2-VASc Score of 3.  Continue Apixaban (Eliquis).     Follow up with Dr Quentin Ore as scheduled.    [facility] [address] [place],  [address] [phone] 05/29/2021 1:48 PM

## 2021-05-30 ENCOUNTER — Encounter: Payer: Self-pay | Admitting: Internal Medicine

## 2021-05-30 ENCOUNTER — Ambulatory Visit (INDEPENDENT_AMBULATORY_CARE_PROVIDER_SITE_OTHER): Payer: Medicare Other | Admitting: Internal Medicine

## 2021-05-30 VITALS — BP 118/68 | HR 101 | Temp 97.8°F | Ht 61.5 in | Wt 98.6 lb

## 2021-05-30 DIAGNOSIS — R7989 Other specified abnormal findings of blood chemistry: Secondary | ICD-10-CM | POA: Diagnosis not present

## 2021-05-30 DIAGNOSIS — M1990 Unspecified osteoarthritis, unspecified site: Secondary | ICD-10-CM

## 2021-05-30 DIAGNOSIS — I480 Paroxysmal atrial fibrillation: Secondary | ICD-10-CM | POA: Diagnosis not present

## 2021-05-30 DIAGNOSIS — J479 Bronchiectasis, uncomplicated: Secondary | ICD-10-CM

## 2021-05-30 DIAGNOSIS — M069 Rheumatoid arthritis, unspecified: Secondary | ICD-10-CM | POA: Insufficient documentation

## 2021-05-30 LAB — URINALYSIS
Bilirubin Urine: NEGATIVE
Hgb urine dipstick: NEGATIVE
Ketones, ur: NEGATIVE
Leukocytes,Ua: NEGATIVE
Nitrite: NEGATIVE
Specific Gravity, Urine: 1.025 (ref 1.000–1.030)
Urine Glucose: NEGATIVE
Urobilinogen, UA: 0.2 (ref 0.0–1.0)
pH: 6 (ref 5.0–8.0)

## 2021-05-30 LAB — CBC WITH DIFFERENTIAL/PLATELET
Basophils Absolute: 0.1 10*3/uL (ref 0.0–0.1)
Basophils Relative: 0.6 % (ref 0.0–3.0)
Eosinophils Absolute: 0.1 10*3/uL (ref 0.0–0.7)
Eosinophils Relative: 0.7 % (ref 0.0–5.0)
HCT: 42.3 % (ref 36.0–46.0)
Hemoglobin: 13.7 g/dL (ref 12.0–15.0)
Lymphocytes Relative: 7.6 % — ABNORMAL LOW (ref 12.0–46.0)
Lymphs Abs: 1.1 10*3/uL (ref 0.7–4.0)
MCHC: 32.5 g/dL (ref 30.0–36.0)
MCV: 94 fl (ref 78.0–100.0)
Monocytes Absolute: 1.4 10*3/uL — ABNORMAL HIGH (ref 0.1–1.0)
Monocytes Relative: 9.5 % (ref 3.0–12.0)
Neutro Abs: 12 10*3/uL — ABNORMAL HIGH (ref 1.4–7.7)
Neutrophils Relative %: 81.6 % — ABNORMAL HIGH (ref 43.0–77.0)
Platelets: 408 10*3/uL — ABNORMAL HIGH (ref 150.0–400.0)
RBC: 4.5 Mil/uL (ref 3.87–5.11)
RDW: 12.6 % (ref 11.5–15.5)
WBC: 14.7 10*3/uL — ABNORMAL HIGH (ref 4.0–10.5)

## 2021-05-30 LAB — COMPREHENSIVE METABOLIC PANEL
ALT: 15 U/L (ref 0–35)
AST: 18 U/L (ref 0–37)
Albumin: 4 g/dL (ref 3.5–5.2)
Alkaline Phosphatase: 70 U/L (ref 39–117)
BUN: 25 mg/dL — ABNORMAL HIGH (ref 6–23)
CO2: 31 mEq/L (ref 19–32)
Calcium: 9.5 mg/dL (ref 8.4–10.5)
Chloride: 100 mEq/L (ref 96–112)
Creatinine, Ser: 0.74 mg/dL (ref 0.40–1.20)
GFR: 78.71 mL/min (ref >60.00)
Glucose, Bld: 99 mg/dL (ref 70–99)
Potassium: 3.9 mEq/L (ref 3.5–5.1)
Sodium: 138 mEq/L (ref 135–145)
Total Bilirubin: 0.4 mg/dL (ref 0.2–1.2)
Total Protein: 7.5 g/dL (ref 6.0–8.3)

## 2021-05-30 LAB — T3, FREE: T3, Free: 3.1 pg/mL (ref 2.3–4.2)

## 2021-05-30 LAB — URIC ACID: Uric Acid, Serum: 3.5 mg/dL (ref 2.4–7.0)

## 2021-05-30 LAB — T4, FREE: Free T4: 0.95 ng/dL (ref 0.60–1.60)

## 2021-05-30 LAB — TSH: TSH: 3.09 u[IU]/mL (ref 0.35–5.50)

## 2021-05-30 LAB — SEDIMENTATION RATE: Sed Rate: 38 mm/hr — ABNORMAL HIGH (ref 0–30)

## 2021-05-30 NOTE — Assessment & Plan Note (Addendum)
Inflammatory arthritis symptoms - new.  Unclear etiology.  Once she had an excellent response to 5 day course of steroids while in [place] last fall. Monica [name] had an x-ray of her index finger at her hand surgeon's office.  Apparently the x-ray was normal. Monica [name] has not tried nonsteroidals since she is on a blood thinner.  Tylenol does not help her. She declined pain meds  We discussed the possibility of using a very low-dose of prednisone.  She will think about it She can try turmeric and sour cherry extract for now.  We obtained blood work including sed rate, uric acid, rheumatoid factor -elevated  This is suspicious for rheumatoid arthritis.  We can repeat RF and get a CCP test to confirm the diagnosis of rheumatoid arthritis.  Alternatively, we can see a rheumatologist for a consultation.

## 2021-05-30 NOTE — Progress Notes (Addendum)
Subjective:  Patient ID: Monica [name], female    DOB: 08/25/1945  Age: 76 y.o. MRN: [id number]  CC: Joint Swelling (Pt states she been having joint pain since Christmas off and on.. swelling in fingers )   HPI [name] presents for A fib (s/p ER visit) C/o severe joint pains 5-10/10 since Christmas. One index finger was swollen in November. C/o stiffness.  Took Prednisone  a few months ago - it helped x 5 d.  [name] had an x-ray of her index finger at her hand surgeon's office.  Apparently the x-ray was normal. [name] has not tried nonsteroidals since she is on a blood thinner.  Tylenol does not help her. Follow-up on abnormal TSH and atrial fibrillation   Outpatient Medications Prior to Visit  Medication Sig Dispense Refill   acetaminophen (TYLENOL) 500 MG tablet Take 500 mg by mouth every 6 (six) hours as needed for moderate pain or headache.     calcium carbonate (TUMS EX) 750 MG chewable tablet Chew 1,500 mg by mouth at bedtime.     CALCIUM-MAGNESIUM-ZINC PO Take 1 tablet by mouth daily.     Cholecalciferol (VITAMIN D) 1000 UNITS capsule Take 1,000 Units by mouth daily.     dextromethorphan (DELSYM) 30 MG/5ML liquid Take 30 mg by mouth at bedtime as needed for cough.      ELIQUIS 5 MG TABS tablet TAKE 1 TABLET TWICE A DAY 180 tablet 3   Estradiol 10 MCG TABS Place 10 mcg vaginally 3 (three) times a week.     famotidine (PEPCID) 40 MG tablet Take 1 tablet (40 mg total) by mouth 2 (two) times daily. 60 tablet 11   Lactobacillus Rhamnosus, GG, (CULTURELLE PO) Take 1 tablet by mouth daily.     metoprolol tartrate (LOPRESSOR) 25 MG tablet Take 1 tablet (25 mg total) by mouth 2 (two) times daily. 60 tablet 3   Multiple Vitamin (MULTIVITAMIN WITH MINERALS) TABS tablet Take 1 tablet by mouth every evening. Centrum Silver     Respiratory Therapy Supplies (FLUTTER) DEVI Twice a day and prn as needed, may increase if feeling worse 1 each 0   No facility-administered medications  prior to visit.    ROS: Review of Systems  Constitutional:  Positive for fatigue. Negative for activity change, appetite change, chills and unexpected weight change.  HENT:  Negative for congestion, mouth sores and sinus pressure.   Eyes:  Negative for visual disturbance.  Respiratory:  Negative for cough, chest tightness, shortness of breath and wheezing.   Cardiovascular:  Negative for chest pain and leg swelling.  Gastrointestinal:  Negative for abdominal pain and nausea.  Genitourinary:  Negative for difficulty urinating, frequency and vaginal pain.  Musculoskeletal:  Positive for arthralgias and myalgias. Negative for back pain and gait problem.  Skin:  Negative for pallor and rash.  Neurological:  Negative for dizziness, tremors, weakness, numbness and headaches.  Hematological:  Negative for adenopathy. Bruises/bleeds easily.  Psychiatric/Behavioral:  Positive for sleep disturbance. Negative for confusion. The patient is not nervous/anxious.    Objective:  BP 118/68 (BP Location: Left Arm)    Pulse (!) 101    Temp 97.8 F (36.6 C) (Oral)    Ht 5' 1.5" (1.562 m)    Wt 98 lb 9.6 oz (44.7 kg)    SpO2 98%    BMI 18.33 kg/m   BP Readings from Last 3 Encounters:  05/30/21 118/68  05/29/21 114/78  05/29/21 124/70    Wt Readings from Last  3 Encounters:  05/30/21 98 lb 9.6 oz (44.7 kg)  05/29/21 99 lb 3.2 oz (45 kg)  05/29/21 97 lb 3.2 oz (44.1 kg)    Physical Exam Constitutional:      General: She is not in acute distress.    Appearance: Normal appearance. She is well-developed.  HENT:     Head: Normocephalic.     Right Ear: External ear normal.     Left Ear: External ear normal.     Nose: Nose normal.  Eyes:     General:        Right eye: No discharge.        Left eye: No discharge.     Conjunctiva/sclera: Conjunctivae normal.     Pupils: Pupils are equal, round, and reactive to light.  Neck:     Thyroid: No thyromegaly.     Vascular: No JVD.     Trachea: No  tracheal deviation.  Cardiovascular:     Rate and Rhythm: Normal rate and regular rhythm.     Heart sounds: Normal heart sounds.  Pulmonary:     Effort: No respiratory distress.     Breath sounds: No stridor. No wheezing.  Abdominal:     General: Bowel sounds are normal. There is no distension.     Palpations: Abdomen is soft. There is no mass.     Tenderness: There is no abdominal tenderness. There is no guarding or rebound.  Musculoskeletal:        General: No tenderness.     Cervical back: Normal range of motion and neck supple. No rigidity.  Lymphadenopathy:     Cervical: No cervical adenopathy.  Skin:    Findings: No erythema or rash.  Neurological:     Cranial Nerves: No cranial nerve deficit.     Motor: No abnormal muscle tone.     Coordination: Coordination normal.     Deep Tendon Reflexes: Reflexes normal.  Psychiatric:        Behavior: Behavior normal.        Thought Content: Thought content normal.        Judgment: Judgment normal.  Thyroid is normal Thin.  No swollen joints.  No painful joints at the moment   Lab Results  Component Value Date   WBC 14.7 (H) 05/30/2021   HGB 13.7 05/30/2021   HCT 42.3 05/30/2021   PLT 408.0 (H) 05/30/2021   GLUCOSE 99 05/30/2021   CHOL 140 07/06/2018   TRIG 41.0 07/06/2018   HDL 62.80 07/06/2018   LDLCALC 69 07/06/2018   ALT 15 05/30/2021   AST 18 05/30/2021   NA 138 05/30/2021   K 3.9 05/30/2021   CL 100 05/30/2021   CREATININE 0.74 05/30/2021   BUN 25 (H) 05/30/2021   CO2 31 05/30/2021   TSH 3.09 05/30/2021    No results found.  Assessment & Plan:   Problem List Items Addressed This Visit     Abnormal TSH - Primary    Obtain TSH, free T4, free T3      Relevant Orders   T3, free (Completed)   T4, free (Completed)   TSH (Completed)   Arthritis    Inflammatory arthritis symptoms - new.  Unclear etiology.  Once she had an excellent response to 5 day course of steroids while in [place] last fall. [name]  had an x-ray of her index finger at her hand surgeon's office.  Apparently the x-ray was normal. [name] has not tried nonsteroidals since she is on a  blood thinner.  Tylenol does not help her. She declined pain meds  We discussed the possibility of using a very low-dose of prednisone.  She will think about it She can try turmeric and sour cherry extract for now.  We obtained blood work including sed rate, uric acid, rheumatoid factor -elevated  This is suspicious for rheumatoid arthritis.  We can repeat RF and get a CCP test to confirm the diagnosis of rheumatoid arthritis.  Alternatively, we can see a rheumatologist for a consultation.        Relevant Orders   CBC with Differential/Platelet (Completed)   Comprehensive metabolic panel (Completed)   Uric acid (Completed)   Rheumatoid factor (Completed)   Urinalysis (Completed)   Sedimentation rate (Completed)   Bronchiectasis without acute exacerbation ([place])    Continue with observation      Paroxysmal atrial fibrillation ([place])   Relevant Orders   CBC with Differential/Platelet (Completed)   Comprehensive metabolic panel (Completed)   T3, free (Completed)   T4, free (Completed)   TSH (Completed)   Urinalysis (Completed)      No orders of the defined types were placed in this encounter.     Follow-up: Return in about 4 weeks (around 06/27/2021) for a follow-up visit.  Walker Kehr, MD

## 2021-05-30 NOTE — Assessment & Plan Note (Signed)
Continue with observation 

## 2021-05-30 NOTE — Assessment & Plan Note (Signed)
Obtain TSH, free T4, free T3

## 2021-05-31 LAB — RHEUMATOID FACTOR: Rheumatoid fact SerPl-aCnc: 92 IU/mL — ABNORMAL HIGH (ref <14)

## 2021-06-01 NOTE — Telephone Encounter (Signed)
Pt requesting a call back to discuss labs and symptoms listed below  Pt requesting a rx for pain  *see below*

## 2021-06-01 NOTE — Addendum Note (Signed)
Addended by: Cassandria Anger on: 06/01/2021 11:20 AM   Modules accepted: Level of Service

## 2021-06-04 ENCOUNTER — Encounter: Payer: Self-pay | Admitting: Internal Medicine

## 2021-06-04 ENCOUNTER — Other Ambulatory Visit: Payer: Self-pay | Admitting: Internal Medicine

## 2021-06-04 DIAGNOSIS — M1990 Unspecified osteoarthritis, unspecified site: Secondary | ICD-10-CM

## 2021-06-04 MED ORDER — PREDNISONE 5 MG PO TABS: 5.0000 mg | ORAL_TABLET | Freq: Every day | ORAL | 1 refills | Status: DC

## 2021-06-07 ENCOUNTER — Other Ambulatory Visit: Payer: Self-pay | Admitting: Internal Medicine

## 2021-06-07 ENCOUNTER — Other Ambulatory Visit: Payer: Medicare Other

## 2021-06-07 ENCOUNTER — Other Ambulatory Visit: Payer: Self-pay

## 2021-06-07 DIAGNOSIS — M1990 Unspecified osteoarthritis, unspecified site: Secondary | ICD-10-CM

## 2021-06-07 MED ORDER — TRAMADOL HCL 50 MG PO TABS: 25.0000 mg | ORAL_TABLET | Freq: Four times a day (QID) | ORAL | 0 refills | Status: AC | PRN

## 2021-06-08 LAB — CYCLIC CITRUL PEPTIDE ANTIBODY, IGG: Cyclic Citrullin Peptide Ab: >250 UNITS — ABNORMAL HIGH

## 2021-06-08 LAB — RHEUMATOID FACTOR: Rheumatoid fact SerPl-aCnc: 138 IU/mL — ABNORMAL HIGH (ref <14)

## 2021-06-11 ENCOUNTER — Encounter: Payer: Self-pay | Admitting: Internal Medicine

## 2021-06-12 ENCOUNTER — Other Ambulatory Visit: Payer: Self-pay | Admitting: Internal Medicine

## 2021-06-12 MED ORDER — PREDNISONE 10 MG PO TABS: 40.0000 mg | ORAL_TABLET | Freq: Every day | ORAL | 0 refills | Status: DC

## 2021-06-18 ENCOUNTER — Encounter: Payer: Self-pay | Admitting: Internal Medicine

## 2021-06-18 ENCOUNTER — Ambulatory Visit: Payer: Medicare Other

## 2021-06-18 ENCOUNTER — Telehealth: Payer: Self-pay

## 2021-06-18 NOTE — Telephone Encounter (Signed)
Made several attempts to contact patient in regards to AWV today @ 2:00pm. Left vm messaging advising patient of appointment and to contact office to reschedule appointment when available. TM

## 2021-06-18 NOTE — Progress Notes (Signed)
 No show for AWV

## 2021-06-19 ENCOUNTER — Telehealth: Payer: Self-pay | Admitting: Internal Medicine

## 2021-06-19 NOTE — Telephone Encounter (Signed)
[facility] calls today needing results form an xray for PT. Results can be faxed to: [phone]  CB if needed: [phone]

## 2021-06-20 ENCOUNTER — Encounter: Payer: Self-pay | Admitting: Cardiology

## 2021-06-20 ENCOUNTER — Ambulatory Visit (INDEPENDENT_AMBULATORY_CARE_PROVIDER_SITE_OTHER): Payer: Medicare Other | Admitting: Cardiology

## 2021-06-20 ENCOUNTER — Other Ambulatory Visit: Payer: Self-pay

## 2021-06-20 VITALS — BP 116/68 | HR 82 | Ht 61.5 in | Wt 100.2 lb

## 2021-06-20 DIAGNOSIS — I480 Paroxysmal atrial fibrillation: Secondary | ICD-10-CM

## 2021-06-20 DIAGNOSIS — M2550 Pain in unspecified joint: Secondary | ICD-10-CM

## 2021-06-20 MED ORDER — METOPROLOL SUCCINATE ER 25 MG PO TB24: 25.0000 mg | ORAL_TABLET | Freq: Two times a day (BID) | ORAL | 3 refills | Status: DC

## 2021-06-20 NOTE — Patient Instructions (Addendum)
Medication Instructions:  Start Metoprolol Succinate 25 mg two time a day Stop Metoprolol Tartrate  Your physician recommends that you continue on your current medications as directed. Please refer to the Current Medication list given to you today. *If you need a refill on your cardiac medications before your next appointment, please call your pharmacy*  Lab Work: None. If you have labs (blood work) drawn today and your tests are completely normal, you will receive your results only by: [place] (if you have MyChart) OR A paper copy in the mail If you have any lab test that is abnormal or we need to change your treatment, we will call you to review the results.  Testing/Procedures: None.  Follow-Up: At [facility], you and your health needs are our priority.  As part of our continuing mission to provide you with exceptional heart care, we have created designated Provider Care Teams.  These Care Teams include your primary Cardiologist (physician) and Advanced Practice Providers (APPs -  Physician Assistants and Nurse Practitioners) who all work together to provide you with the care you need, when you need it.  Your physician wants you to follow-up in: 6 months with  one of the following Advanced Practice Providers on your designated Care Team:    Tommye Standard, [place] Legrand Como "Jonni Sanger" [place], [place]   You will receive a reminder letter in the mail two months in advance. If you don't receive a letter, please call our office to schedule the follow-up appointment.  We recommend signing up for the patient portal called "MyChart".  Sign up information is provided on this After Visit Summary.  MyChart is used to connect with patients for Virtual Visits (Telemedicine).  Patients are able to view lab/test results, encounter notes, upcoming appointments, etc.  Non-urgent messages can be sent to your provider as well.   To learn more about what you can do with MyChart, go to  [email or web address].    Any Other Special Instructions Will Be Listed Below (If Applicable).

## 2021-06-20 NOTE — Progress Notes (Signed)
Electrophysiology Office Note:    Date:  06/20/2021   ID:  Monica [name], DOB April 03, 1946, MRN [id number]  PCP:  Cassandria Anger, MD  [facility] Cardiologist:  Sanda Klein, MD  [facility] Electrophysiologist:  Vickie Epley, MD   Referring MD: Sanda Klein, MD   Chief Complaint: Consult for ablation  History of Present Illness:    Monica [name] is a 76 y.o. female who presents for an evaluation for possible ablation at the request of Dr. Sallyanne [name]. Their medical history includes paroxysmal atrial fibrillation, bronchiectasis, and GERD.  Monica [name] was seen 03/23/2021 by Dr. Sallyanne [name]. She reported more frequent palpitations over the past year, and 1 episode of Afib lasting 4 hours. After shared decision making, she wished to continue with taking metoprolol PRN. On 05/21/2021 she requested a referral to EP to discuss possible ablation.  She called the office 05/24/2021 and stated she was in possible Afib for 6 hours with heart rate 120-150 bpm. This was disrupting her sleep. She took 12.5 mg metoprolol without relief. Advised to present to the ED. She self-converted to NSR while there. It was recommended that she take metoprolol tartrate 25 mg twice daily.  She followed up with Adline Peals, PA on 05/29/2021. She was in SR and denied any interim episodes of Afib.  Overall, she is feeling okay. Typically while in atrial fibrillation she will feel a pounding, rapid heart rate. This always happens at night or late evening. Generally, when she reverts to sinus rhythm she feels better immediately. Most of the time, her palpitations are difficult for her to catch with her Kardia mobile. Currently she is taking metoprolol twice a day, and reports feeling fatigue as a side effect.  In the past month, she denies any recurrent atrial fibrillation. However, she has felt palpitations as well as sensations in her chest that she attributes to taking prednisone. This was started for her  arthralgias. She has been referred to a rheumatologist for possible rheumatoid arthritis.  At home, her blood pressure used to be in the 100s-110s, now she averages 329-518 systolic.  She believes that she likely snores, but she is not certain.  She denies any chest pain, shortness of breath, or peripheral edema. No lightheadedness, headaches, syncope, orthopnea, or PND.      Past Medical History:  Diagnosis Date   Adenomatous colon polyp 10/2003   Allergic rhinitis    Allergy    SEASONAL   Bronchiectasis    Chronic sinusitis    Diverticulosis    GERD (gastroesophageal reflux disease)    Hearing loss    Hemorrhoids    Insomnia    Menopausal disorder    Osteoporosis    Palpitations    Paroxysmal A-fib (HCC)    TMJ syndrome    Urticaria     Past Surgical History:  Procedure Laterality Date   COLONOSCOPY     NASAL SINUS SURGERY     TYMPANOSTOMY     VIDEO BRONCHOSCOPY Bilateral 07/16/2016   Procedure: VIDEO BRONCHOSCOPY WITH FLUORO;  Surgeon: Collene Gobble, MD;  Location: [facility];  Service: Cardiopulmonary;  Laterality: Bilateral;    Current Medications: Current Meds  Medication Sig   acetaminophen (TYLENOL) 500 MG tablet Take 500 mg by mouth every 6 (six) hours as needed for moderate pain or headache.   calcium carbonate (TUMS EX) 750 MG chewable tablet Chew 1,500 mg by mouth at bedtime.   CALCIUM-MAGNESIUM-ZINC PO Take 1 tablet by mouth daily.   Cholecalciferol (  VITAMIN D) 1000 UNITS capsule Take 1,000 Units by mouth daily.   dextromethorphan (DELSYM) 30 MG/5ML liquid Take 30 mg by mouth at bedtime as needed for cough.    ELIQUIS 5 MG TABS tablet TAKE 1 TABLET TWICE A DAY   Estradiol 10 MCG TABS Place 10 mcg vaginally 3 (three) times a week.   famotidine (PEPCID) 40 MG tablet Take 1 tablet (40 mg total) by mouth 2 (two) times daily. (Patient taking differently: Take 40 mg by mouth daily.)   Lactobacillus Rhamnosus, GG, (CULTURELLE PO) Take 1 tablet by mouth  daily.   loratadine (CLARITIN) 10 MG tablet Take 10 mg by mouth daily as needed for allergies.   metoprolol succinate (TOPROL XL) 25 MG 24 hr tablet Take 1 tablet (25 mg total) by mouth in the morning and at bedtime.   Multiple Vitamin (MULTIVITAMIN WITH MINERALS) TABS tablet Take 1 tablet by mouth every evening. Centrum Silver   predniSONE (DELTASONE) 10 MG tablet Take 4 tablets (40 mg total) by mouth daily with breakfast.   Respiratory Therapy Supplies (FLUTTER) DEVI Twice a day and prn as needed, may increase if feeling worse   [DISCONTINUED] metoprolol tartrate (LOPRESSOR) 25 MG tablet Take 1 tablet (25 mg total) by mouth 2 (two) times daily.     Allergies:   Albuterol, Macrobid [nitrofurantoin macrocrystal], Amoxicillin-pot clavulanate, Avelox [moxifloxacin hcl in nacl], Bactrim [sulfamethoxazole-trimethoprim], Benzonatate, and Ciprofloxacin   Social History   Socioeconomic History   Marital status: Widowed    Spouse name: [name]   Number of children: 0   Years of education: BSN   Highest education level: Not on file  Occupational History   Occupation: retired Therapist, sports  Tobacco Use   Smoking status: Never   Smokeless tobacco: Never  Vaping Use   Vaping Use: Never used  Substance and Sexual Activity   Alcohol use: Yes    Alcohol/week: 1.0 - 2.0 standard drink    Types: 1 - 2 Glasses of wine per week    Comment: 1-2 glasses of wine a month 05/29/21   Drug use: No   Sexual activity: Not Currently  Other Topics Concern   Not on file  Social History Narrative   ** Merged History Encounter **       Lives with husband Caffeine use: 1 cup tea per day   Social Determinants of Health   Financial Resource Strain: Not on file  Food Insecurity: Not on file  Transportation Needs: Not on file  Physical Activity: Not on file  Stress: Not on file  Social Connections: Not on file     Family History: The patient's family history includes AVM in her brother; Cancer in her maternal  grandmother, mother, and paternal uncle; Colon cancer (age of onset: 19) in her mother; Heart disease in her father and maternal grandfather; Hypertension in her father; Rectal cancer in her mother; Seizures in her mother. There is no history of Esophageal cancer.  ROS:   Please see the history of present illness.    (+) Palpitations (+) Fatigue (+) Arthralgias All other systems reviewed and are negative.  EKGs/Labs/Other Studies Reviewed:    The following studies were reviewed today:  CT Chest 02/12/2021: COMPARISON:  07/10/2018   FINDINGS: Cardiovascular: Aortic atherosclerosis. Normal heart size. No pericardial effusion.   Mediastinum/Nodes: No enlarged mediastinal, hilar, or axillary lymph nodes. Thyroid gland, trachea, and esophagus demonstrate no significant findings.   Lungs/Pleura: Redemonstrated diffuse, tubular bronchiectasis throughout the lungs, with areas of fibrotic scarring and consolidation  of the medial right middle lobe and lingula, and extensive, clustered heterogeneous opacity and centrilobular nodularity throughout the lungs, predominantly seen in the dependent lower lobes (series 5, image 76, 95). Biapical pleuroparenchymal scarring. No pleural effusion or pneumothorax.   Upper Abdomen: No acute abnormality.   Musculoskeletal: No chest wall mass or suspicious bone lesions identified.   IMPRESSION: Redemonstrated diffuse, tubular bronchiectasis throughout the lungs, with areas of fibrotic scarring and consolidation of the medial right middle lobe and lingula, and extensive, clustered  heterogeneous opacity and centrilobular nodularity throughout the lungs, predominantly seen in the dependent lower lobes. 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   Aortic Atherosclerosis (ICD10-I70.0).  Monitor 06/2018: The dominant rhythm is  normal sinus rhythm with normal circadian variation. There are frequent PACs, blocked PACs, atrial couplets and occasional brief runs of nonsustained atrial tachycardia. There are 2 episodes of coarse atrial fibrillation with controlled ventricular response, each lasting less than 2 minutes in duration. The overall burden of atrial fibrillation is well under 1%. There are rare PVCs and there is no complex ventricular arrhythmia. Rare ventricular couplets are seen. Interestingly, there is very poor correlation between patient symptom-driven recordings and the rhythm recorded. Most of the patient triggered recordings show normal sinus rhythm. Some of them do show isolated PACs.   Abnormal event monitor due to the presence of frequent atrial arrhythmia including frequent PACs, blocked PACs, brief nonsustained paroxysmal atrial tachycardia and very brief episodes of sustained paroxysmal atrial fibrillation.   The patient's symptoms are not clearly associated with the rhythm abnormalities recorded.  Echo 10/06/2015: Study Conclusions   - Left ventricle: The cavity size was normal. Systolic function was    normal. The estimated ejection fraction was in the range of 55%    to 60%. Wall motion was normal; there were no regional wall    motion abnormalities. Doppler parameters are consistent with    abnormal left ventricular relaxation (grade 1 diastolic    dysfunction). There was no evidence of elevated ventricular    filling pressure by Doppler parameters.  - Aortic valve: Trileaflet; normal thickness leaflets. There was no    regurgitation.  - Aortic root: The aortic root was normal in size.  - Mitral valve: There was no regurgitation.  - Left atrium: The atrium was normal in size.  - Right ventricle: Systolic function was normal.  - Right atrium: The atrium was normal in size.  - Tricuspid valve: There was trivial regurgitation.  - Pulmonic valve: There was trivial regurgitation.  - Pulmonary  arteries: Systolic pressure was within the normal    range.  - Inferior vena cava: The vessel was normal in size.  - Pericardium, extracardiac: There was no pericardial effusion.  EKG:   EKG is personally reviewed.  06/20/2021: Sinus rhythm.  Normal intervals.   Recent Labs: 08/08/2020: Pro B Natriuretic peptide (BNP) 68.0 05/24/2021: Magnesium 2.4 05/30/2021: ALT 15; BUN 25; Creatinine, Ser 0.74; Hemoglobin 13.7; Platelets 408.0; Potassium 3.9; Sodium 138; TSH 3.09   Recent Lipid Panel    Component Value Date/Time   CHOL 140 07/06/2018 0755   TRIG 41.0 07/06/2018 0755   HDL 62.80 07/06/2018 0755   CHOLHDL 2 07/06/2018 0755   VLDL 8.2 07/06/2018 0755   LDLCALC 69 07/06/2018 0755    Physical Exam:    VS:  BP 116/68    Pulse 82    Ht 5' 1.5" (1.562 m)  Wt 100 lb 3.2 oz (45.5 kg)    SpO2 99%    BMI 18.63 kg/m     Wt Readings from Last 3 Encounters:  06/20/21 100 lb 3.2 oz (45.5 kg)  05/30/21 98 lb 9.6 oz (44.7 kg)  05/29/21 99 lb 3.2 oz (45 kg)     GEN: Well nourished, well developed in no acute distress HEENT: Normal NECK: No JVD; No carotid bruits LYMPHATICS: No lymphadenopathy CARDIAC: RRR, no murmurs, rubs, gallops RESPIRATORY:  Clear to auscultation without rales, wheezing or rhonchi  ABDOMEN: Soft, non-tender, non-distended MUSCULOSKELETAL:  No edema; No deformity  SKIN: Warm and dry NEUROLOGIC:  Alert and oriented x 3 PSYCHIATRIC:  Normal affect       ASSESSMENT:    1. Paroxysmal atrial fibrillation (HCC)   2. Arthralgia, unspecified joint    PLAN:    In order of problems listed above:  #Paroxysmal atrial fibrillation Symptomatic paroxysms of atrial fibrillation.  She is on Eliquis for stroke prophylaxis.  I do think a rhythm control strategy is indicated given the highly symptomatic nature of her salvos of A-fib.  We discussed antiarrhythmic drugs and catheter ablation during today's appointment.  I do think in the long-term that a catheter ablation  would be indicated and a good option for Monica [name].  Unfortunately, she is experiencing significant joint pain and has been referred to a rheumatologist for suspected rheumatoid arthritis.  She has been placed on standing prednisone 40 mg by mouth daily.  She has an appointment in the coming weeks with the rheumatologist.  I think until the joint pain is sorted out and she is off the chronic steroids, I would prefer to avoid an invasive procedure.  Would plan for increased dose of beta-blockers for now with an eye towards offering catheter ablation when she is able to come off of her steroids.  She is in agreement with this plan.  For now, continue Eliquis.  I will also change her to the long-acting metoprolol 25 mg by mouth twice daily.  If she has breakthrough atrial fibrillation would plan to slowly uptitrate the beta-blocker as her heart rate and blood pressure allow.  Follow-up in 4-6 months.  If off steroids by that time, consider scheduling catheter ablation.  Total time spent with patient today 60 minutes. This includes reviewing records, evaluating the patient and coordinating care.  Medication Adjustments/Labs and Tests Ordered: Current medicines are reviewed at length with the patient today.  Concerns regarding medicines are outlined above.   Orders Placed This Encounter  Procedures   EKG 12-Lead   Meds ordered this encounter  Medications   metoprolol succinate (TOPROL XL) 25 MG 24 hr tablet    Sig: Take 1 tablet (25 mg total) by mouth in the morning and at bedtime.    Dispense:  180 tablet    Refill:  3   I,Mathew Stumpf,acting as a scribe for Vickie Epley, MD.,have documented all relevant documentation on the behalf of Vickie Epley, MD,as directed by  Vickie Epley, MD while in the presence of Vickie Epley, MD.  I, Vickie Epley, MD, have reviewed all documentation for this visit. The documentation on 06/20/21 for the exam, diagnosis, procedures, and orders  are all accurate and complete.   Signed, Hilton Cork. Quentin Ore, MD, [facility], [facility] 06/20/2021 8:13 PM    Electrophysiology [facility]

## 2021-06-21 ENCOUNTER — Encounter: Payer: Self-pay | Admitting: Cardiology

## 2021-06-27 ENCOUNTER — Ambulatory Visit (INDEPENDENT_AMBULATORY_CARE_PROVIDER_SITE_OTHER): Payer: Medicare Other | Admitting: Internal Medicine

## 2021-06-27 ENCOUNTER — Encounter: Payer: Self-pay | Admitting: Internal Medicine

## 2021-06-27 ENCOUNTER — Other Ambulatory Visit: Payer: Self-pay

## 2021-06-27 DIAGNOSIS — I480 Paroxysmal atrial fibrillation: Secondary | ICD-10-CM

## 2021-06-27 DIAGNOSIS — M059 Rheumatoid arthritis with rheumatoid factor, unspecified: Secondary | ICD-10-CM

## 2021-06-27 DIAGNOSIS — J479 Bronchiectasis, uncomplicated: Secondary | ICD-10-CM

## 2021-06-27 DIAGNOSIS — R002 Palpitations: Secondary | ICD-10-CM | POA: Diagnosis not present

## 2021-06-27 MED ORDER — PREDNISONE 10 MG PO TABS: 30.0000 mg | ORAL_TABLET | Freq: Every day | ORAL | 0 refills | Status: DC

## 2021-06-27 NOTE — Assessment & Plan Note (Signed)
On Metoprolol 

## 2021-06-27 NOTE — Assessment & Plan Note (Addendum)
Doing well Stable

## 2021-06-27 NOTE — Assessment & Plan Note (Signed)
On Eliquis

## 2021-06-27 NOTE — Assessment & Plan Note (Addendum)
Refractory on low dose steroids - much better on Prednisone 40 mg/d. Will reduce to 30 mg/d Rheum appt pending soon

## 2021-06-27 NOTE — Telephone Encounter (Signed)
Been trying to contact [facility]. However, the number given is incorrect and when I try calling the office there is no answer to verify which date x-ray they are needing.

## 2021-06-27 NOTE — Progress Notes (Signed)
Subjective:  Patient ID: Monica [name], female    DOB: 1945-10-20  Age: 76 y.o. MRN: [id number]  CC: Follow-up and Medication Management (Questions about steroids she is taking )   HPI Monica [name] presents for RA Prednisone helped a lot but she is hyper, some palpitations  Outpatient Medications Prior to Visit  Medication Sig Dispense Refill   acetaminophen (TYLENOL) 500 MG tablet Take 500 mg by mouth every 6 (six) hours as needed for moderate pain or headache.     calcium carbonate (TUMS EX) 750 MG chewable tablet Chew 1,500 mg by mouth at bedtime.     CALCIUM-MAGNESIUM-ZINC PO Take 1 tablet by mouth daily.     Cholecalciferol (VITAMIN D) 1000 UNITS capsule Take 1,000 Units by mouth daily.     dextromethorphan (DELSYM) 30 MG/5ML liquid Take 30 mg by mouth at bedtime as needed for cough.      ELIQUIS 5 MG TABS tablet TAKE 1 TABLET TWICE A DAY 180 tablet 3   Estradiol 10 MCG TABS Place 10 mcg vaginally 3 (three) times a week.     famotidine (PEPCID) 40 MG tablet Take 1 tablet (40 mg total) by mouth 2 (two) times daily. (Patient taking differently: Take 40 mg by mouth daily.) 60 tablet 11   Lactobacillus Rhamnosus, GG, (CULTURELLE PO) Take 1 tablet by mouth daily.     loratadine (CLARITIN) 10 MG tablet Take 10 mg by mouth daily as needed for allergies.     metoprolol succinate (TOPROL XL) 25 MG 24 hr tablet Take 1 tablet (25 mg total) by mouth in the morning and at bedtime. 180 tablet 3   Multiple Vitamin (MULTIVITAMIN WITH MINERALS) TABS tablet Take 1 tablet by mouth every evening. Centrum Silver     Respiratory Therapy Supplies (FLUTTER) DEVI Twice a day and prn as needed, may increase if feeling worse 1 each 0   predniSONE (DELTASONE) 10 MG tablet Take 4 tablets (40 mg total) by mouth daily with breakfast. 120 tablet 0   No facility-administered medications prior to visit.    ROS: Review of Systems  Constitutional:  Positive for fatigue. Negative for activity change, appetite  change, chills and unexpected weight change.  HENT:  Negative for congestion, mouth sores and sinus pressure.   Eyes:  Negative for visual disturbance.  Respiratory:  Negative for cough and chest tightness.   Gastrointestinal:  Negative for abdominal pain and nausea.  Genitourinary:  Negative for difficulty urinating, frequency and vaginal pain.  Musculoskeletal:  Positive for arthralgias. Negative for back pain and gait problem.  Skin:  Negative for pallor and rash.  Neurological:  Negative for dizziness, tremors, weakness, numbness and headaches.  Psychiatric/Behavioral:  Negative for confusion, sleep disturbance and suicidal ideas.    Objective:  BP (!) 120/58    Pulse 87    Temp 97.9 F (36.6 C) (Oral)    Ht 5' 1.5" (1.562 m)    Wt 100 lb 4 oz (45.5 kg)    SpO2 98%    BMI 18.64 kg/m   BP Readings from Last 3 Encounters:  06/27/21 (!) 120/58  06/20/21 116/68  05/30/21 118/68    Wt Readings from Last 3 Encounters:  06/27/21 100 lb 4 oz (45.5 kg)  06/20/21 100 lb 3.2 oz (45.5 kg)  05/30/21 98 lb 9.6 oz (44.7 kg)    Physical Exam Constitutional:      General: She is not in acute distress.    Appearance: She is well-developed.  HENT:  Head: Normocephalic.     Right Ear: External ear normal.     Left Ear: External ear normal.     Nose: Nose normal.  Eyes:     General:        Right eye: No discharge.        Left eye: No discharge.     Conjunctiva/sclera: Conjunctivae normal.     Pupils: Pupils are equal, round, and reactive to light.  Neck:     Thyroid: No thyromegaly.     Vascular: No JVD.     Trachea: No tracheal deviation.  Cardiovascular:     Rate and Rhythm: Normal rate and regular rhythm.     Heart sounds: Normal heart sounds.  Pulmonary:     Effort: No respiratory distress.     Breath sounds: No stridor. No wheezing.  Abdominal:     General: Bowel sounds are normal. There is no distension.     Palpations: Abdomen is soft. There is no mass.      Tenderness: There is no abdominal tenderness. There is no guarding or rebound.  Musculoskeletal:        General: No tenderness.     Cervical back: Normal range of motion and neck supple. No rigidity.  Lymphadenopathy:     Cervical: No cervical adenopathy.  Skin:    Findings: No erythema or rash.  Neurological:     Mental Status: She is oriented to person, place, and time.     Cranial Nerves: No cranial nerve deficit.     Motor: No abnormal muscle tone.     Coordination: Coordination normal.     Deep Tendon Reflexes: Reflexes normal.  Psychiatric:        Behavior: Behavior normal.        Thought Content: Thought content normal.        Judgment: Judgment normal.    Lab Results  Component Value Date   WBC 14.7 (H) 05/30/2021   HGB 13.7 05/30/2021   HCT 42.3 05/30/2021   PLT 408.0 (H) 05/30/2021   GLUCOSE 99 05/30/2021   CHOL 140 07/06/2018   TRIG 41.0 07/06/2018   HDL 62.80 07/06/2018   LDLCALC 69 07/06/2018   ALT 15 05/30/2021   AST 18 05/30/2021   NA 138 05/30/2021   K 3.9 05/30/2021   CL 100 05/30/2021   CREATININE 0.74 05/30/2021   BUN 25 (H) 05/30/2021   CO2 31 05/30/2021   TSH 3.09 05/30/2021    No results found.  Assessment & Plan:   Problem List Items Addressed This Visit     Bronchiectasis without acute exacerbation ([place])    Doing well Stable      Palpitations    On Metoprolol      Paroxysmal atrial fibrillation (HCC)    On Eliquis      Rheumatoid arthritis ([place])    Refractory on low dose steroids - much better on Prednisone 40 mg/d. Will reduce to 30 mg/d Rheum appt pending soon      Relevant Medications   predniSONE (DELTASONE) 10 MG tablet      Meds ordered this encounter  Medications   predniSONE (DELTASONE) 10 MG tablet    Sig: Take 3 tablets (30 mg total) by mouth daily with breakfast.    Dispense:  120 tablet    Refill:  0      Follow-up: Return in about 3 months (around 09/24/2021) for a follow-up visit.  Walker Kehr,  MD

## 2021-07-10 ENCOUNTER — Ambulatory Visit: Payer: TRICARE For Life (TFL)

## 2021-07-10 DIAGNOSIS — Z681 Body mass index (BMI) 19 or less, adult: Secondary | ICD-10-CM | POA: Diagnosis not present

## 2021-07-10 DIAGNOSIS — M1991 Primary osteoarthritis, unspecified site: Secondary | ICD-10-CM | POA: Diagnosis not present

## 2021-07-10 DIAGNOSIS — M79641 Pain in right hand: Secondary | ICD-10-CM | POA: Diagnosis not present

## 2021-07-10 DIAGNOSIS — R5383 Other fatigue: Secondary | ICD-10-CM | POA: Diagnosis not present

## 2021-07-10 DIAGNOSIS — M79642 Pain in left hand: Secondary | ICD-10-CM | POA: Diagnosis not present

## 2021-07-10 DIAGNOSIS — M0579 Rheumatoid arthritis with rheumatoid factor of multiple sites without organ or systems involvement: Secondary | ICD-10-CM | POA: Diagnosis not present

## 2021-07-17 ENCOUNTER — Other Ambulatory Visit: Payer: Self-pay

## 2021-07-17 ENCOUNTER — Ambulatory Visit (INDEPENDENT_AMBULATORY_CARE_PROVIDER_SITE_OTHER): Payer: Medicare Other

## 2021-07-17 ENCOUNTER — Encounter: Payer: Self-pay | Admitting: Internal Medicine

## 2021-07-17 DIAGNOSIS — Z0000 Encounter for general adult medical examination without abnormal findings: Secondary | ICD-10-CM | POA: Diagnosis not present

## 2021-07-17 NOTE — Patient Instructions (Signed)
[name] , ?Thank you for taking time to come for your Medicare Wellness Visit. I appreciate your ongoing commitment to your health goals. Please review the following plan we discussed and let me know if I can assist you in the future.  ? ?Screening recommendations/referrals: ?Colonoscopy: 08/16/2019; no longer required per patient ?Mammogram: 07/24/2020; due every year ?Bone Density: 07/24/2020; due every 2-3 years ?Recommended yearly ophthalmology/optometry visit for glaucoma screening and checkup ?Recommended yearly dental visit for hygiene and checkup ? ?Vaccinations: ?Influenza vaccine: 02/12/2021 ?Pneumococcal vaccine: 02/16/2013, 09/26/2014 ?Tdap vaccine: 07/17/2012; due every 10 years ?Shingles vaccine: 12/09/2017, 05/28/2018   ?Covid-19: 05/27/2019, 06/17/2019, 02/08/2020, 09/12/2020 ? ?Advanced directives: Please bring a copy of your health care power of attorney and living will to the office at your convenience. ? ?Conditions/risks identified: Yes; Client understands the importance of follow-up appointments with providers by attending scheduled visits and discussed goals to eat healthier, increase physical activity 5 times a week for 30 minutes each, exercise the brain by doing stimulating brain exercises (reading, adult coloring, crafting, listening to music, puzzles, etc.), socialize and enjoy life more, get enough sleep at least 8-9 hours average per night and make time for laughter. ? ?Next appointment: Please schedule your next Medicare Wellness Visit with your Nurse Health Advisor in 1 year by calling [phone]. ? ? ?Preventive Care 45 Years and Older, Female ?Preventive care refers to lifestyle choices and visits with your health care provider that can promote health and wellness. ?What does preventive care include? ?A yearly physical exam. This is also called an annual well check. ?Dental exams once or twice a year. ?Routine eye exams. Ask your health care provider how often you should have your eyes  checked. ?Personal lifestyle choices, including: ?Daily care of your teeth and gums. ?Regular physical activity. ?Eating a healthy diet. ?Avoiding tobacco and drug use. ?Limiting alcohol use. ?Practicing safe sex. ?Taking low-dose aspirin every day. ?Taking vitamin and mineral supplements as recommended by your health care provider. ?What happens during an annual well check? ?The services and screenings done by your health care provider during your annual well check will depend on your age, overall health, lifestyle risk factors, and family history of disease. ?Counseling  ?Your health care provider may ask you questions about your: ?Alcohol use. ?Tobacco use. ?Drug use. ?Emotional well-being. ?Home and relationship well-being. ?Sexual activity. ?Eating habits. ?History of falls. ?Memory and ability to understand (cognition). ?Work and work Statistician. ?Reproductive health. ?Screening  ?You may have the following tests or measurements: ?Height, weight, and BMI. ?Blood pressure. ?Lipid and cholesterol levels. These may be checked every 5 years, or more frequently if you are over 47 years old. ?Skin check. ?Lung cancer screening. You may have this screening every year starting at age 58 if you have a 30-pack-year history of smoking and currently smoke or have quit within the past 15 years. ?Fecal occult blood test (FOBT) of the stool. You may have this test every year starting at age 24. ?Flexible sigmoidoscopy or colonoscopy. You may have a sigmoidoscopy every 5 years or a colonoscopy every 10 years starting at age 82. ?Hepatitis C blood test. ?Hepatitis B blood test. ?Sexually transmitted disease (STD) testing. ?Diabetes screening. This is done by checking your blood sugar (glucose) after you have not eaten for a while (fasting). You may have this done every 1-3 years. ?Bone density scan. This is done to screen for osteoporosis. You may have this done starting at age 95. ?Mammogram. This may be done every 1-2  years. Talk to your health care provider about how often you should have regular mammograms. ?Talk with your health care provider about your test results, treatment options, and if necessary, the need for more tests. ?Vaccines  ?Your health care provider may recommend certain vaccines, such as: ?Influenza vaccine. This is recommended every year. ?Tetanus, diphtheria, and acellular pertussis (Tdap, Td) vaccine. You may need a Td booster every 10 years. ?Zoster vaccine. You may need this after age 72. ?Pneumococcal 13-valent conjugate (PCV13) vaccine. One dose is recommended after age 8. ?Pneumococcal polysaccharide (PPSV23) vaccine. One dose is recommended after age 27. ?Talk to your health care provider about which screenings and vaccines you need and how often you need them. ?This information is not intended to replace advice given to you by your health care provider. Make sure you discuss any questions you have with your health care provider. ?Document Released: 05/19/2015 Document Revised: 01/10/2016 Document Reviewed: 02/21/2015 ?Elsevier Interactive Patient Education ? 2017 [place]. ? ?Fall Prevention in the Home ?Falls can cause injuries. They can happen to people of all ages. There are many things you can do to make your home safe and to help prevent falls. ?What can I do on the outside of my home? ?Regularly fix the edges of walkways and driveways and fix any cracks. ?Remove anything that might make you trip as you walk through a door, such as a raised step or threshold. ?Trim any bushes or trees on the path to your home. ?Use bright outdoor lighting. ?Clear any walking paths of anything that might make someone trip, such as rocks or tools. ?Regularly check to see if handrails are loose or broken. Make sure that both sides of any steps have handrails. ?Any raised decks and porches should have guardrails on the edges. ?Have any leaves, snow, or ice cleared regularly. ?Use sand or salt on walking paths  during winter. ?Clean up any spills in your garage right away. This includes oil or grease spills. ?What can I do in the bathroom? ?Use night lights. ?Install grab bars by the toilet and in the tub and shower. Do not use towel bars as grab bars. ?Use non-skid mats or decals in the tub or shower. ?If you need to sit down in the shower, use a plastic, non-slip stool. ?Keep the floor dry. Clean up any water that spills on the floor as soon as it happens. ?Remove soap buildup in the tub or shower regularly. ?Attach bath mats securely with double-sided non-slip rug tape. ?Do not have throw rugs and other things on the floor that can make you trip. ?What can I do in the bedroom? ?Use night lights. ?Make sure that you have a light by your bed that is easy to reach. ?Do not use any sheets or blankets that are too big for your bed. They should not hang down onto the floor. ?Have a firm chair that has side arms. You can use this for support while you get dressed. ?Do not have throw rugs and other things on the floor that can make you trip. ?What can I do in the kitchen? ?Clean up any spills right away. ?Avoid walking on wet floors. ?Keep items that you use a lot in easy-to-reach places. ?If you need to reach something above you, use a strong step stool that has a grab bar. ?Keep electrical cords out of the way. ?Do not use floor polish or wax that makes floors slippery. If you must use wax, use non-skid floor wax. ?  Do not have throw rugs and other things on the floor that can make you trip. ?What can I do with my stairs? ?Do not leave any items on the stairs. ?Make sure that there are handrails on both sides of the stairs and use them. Fix handrails that are broken or loose. Make sure that handrails are as long as the stairways. ?Check any carpeting to make sure that it is firmly attached to the stairs. Fix any carpet that is loose or worn. ?Avoid having throw rugs at the top or bottom of the stairs. If you do have throw  rugs, attach them to the floor with carpet tape. ?Make sure that you have a light switch at the top of the stairs and the bottom of the stairs. If you do not have them, ask someone to add them for you. ?What else

## 2021-07-17 NOTE — Progress Notes (Addendum)
?I connected with Monica [name] today by telephone and verified that I am speaking with the correct person using two identifiers. ?Location patient: home ?Location provider: work ?Persons participating in the virtual visit: patient, provider. ?  ?I discussed the limitations, risks, security and privacy concerns of performing an evaluation and management service by telephone and the availability of in person appointments. I also discussed with the patient that there may be a patient responsible charge related to this service. The patient expressed understanding and verbally consented to this telephonic visit.  ?  ?Interactive audio and video telecommunications were attempted between this provider and patient, however failed, due to patient having technical difficulties OR patient did not have access to video capability.  We continued and completed visit with audio only. ? ?Some vital signs may be absent or patient reported.  ? ?Time Spent with patient on telephone encounter: 30 minutes ? ?Subjective:  ? Monica [name] is a 76 y.o. female who presents for Medicare Annual (Subsequent) preventive examination. ? ?Review of Systems    ? ?Cardiac Risk Factors include: advanced age (>80mn, >>40women);family history of premature cardiovascular disease ? ?   ?Objective:  ?  ?Today's Vitals  ? 07/17/21 1101  ?PainSc: 4   ? ?There is no height or weight on file to calculate BMI. ? ?Advanced Directives 07/17/2021 11/25/2020 05/18/2020 03/12/2020 08/16/2019 10/02/2018 06/06/2018  ?Does Patient Have a Medical Advance Directive? Yes No Yes No No Yes No  ?Type of Advance Directive Living will;Healthcare Power of [place] will -  ?Does patient want to make changes to medical advance directive? No - Patient declined - No - Patient declined - - - -  ?Copy of [place] Chart? No - copy requested - - - - No - copy requested -  ?Would patient like information on creating a  medical advance directive? - - - No - Patient declined No - Patient declined - Yes (ED - Information included in AVS)  ? ? ?Current Medications (verified) ?Outpatient Encounter Medications as of 07/17/2021  ?Medication Sig  ? acetaminophen (TYLENOL) 500 MG tablet Take 500 mg by mouth every 6 (six) hours as needed for moderate pain or headache.  ? calcium carbonate (TUMS EX) 750 MG chewable tablet Chew 1,500 mg by mouth at bedtime.  ? CALCIUM-MAGNESIUM-ZINC PO Take 1 tablet by mouth daily.  ? Cholecalciferol (VITAMIN D) 1000 UNITS capsule Take 1,000 Units by mouth daily.  ? dextromethorphan (DELSYM) 30 MG/5ML liquid Take 30 mg by mouth at bedtime as needed for cough.   ? ELIQUIS 5 MG TABS tablet TAKE 1 TABLET TWICE A DAY  ? Estradiol 10 MCG TABS Place 10 mcg vaginally 3 (three) times a week.  ? famotidine (PEPCID) 40 MG tablet Take 1 tablet (40 mg total) by mouth 2 (two) times daily. (Patient taking differently: Take 40 mg by mouth daily.)  ? Lactobacillus Rhamnosus, GG, (CULTURELLE PO) Take 1 tablet by mouth daily.  ? loratadine (CLARITIN) 10 MG tablet Take 10 mg by mouth daily as needed for allergies.  ? metoprolol succinate (TOPROL XL) 25 MG 24 hr tablet Take 1 tablet (25 mg total) by mouth in the morning and at bedtime.  ? Multiple Vitamin (MULTIVITAMIN WITH MINERALS) TABS tablet Take 1 tablet by mouth every evening. Centrum Silver  ? predniSONE (DELTASONE) 10 MG tablet Take 3 tablets (30 mg total) by mouth daily with breakfast. (Patient taking differently: Take 10 mg by  mouth daily with breakfast.)  ? Respiratory Therapy Supplies (FLUTTER) DEVI Twice a day and prn as needed, may increase if feeling worse  ? ?No facility-administered encounter medications on file as of 07/17/2021.  ? ? ?Allergies (verified) ?Albuterol, Macrobid [nitrofurantoin macrocrystal], Amoxicillin-pot clavulanate, Avelox [moxifloxacin hcl in nacl], Bactrim [sulfamethoxazole-trimethoprim], Benzonatate, and Ciprofloxacin  ? ?History: ?Past  Medical History:  ?Diagnosis Date  ? Adenomatous colon polyp 10/2003  ? Allergic rhinitis   ? Allergy   ? SEASONAL  ? Bronchiectasis   ? Chronic sinusitis   ? Diverticulosis   ? GERD (gastroesophageal reflux disease)   ? Hearing loss   ? Hemorrhoids   ? Insomnia   ? Menopausal disorder   ? Osteoporosis   ? Palpitations   ? Paroxysmal A-fib ([place])   ? TMJ syndrome   ? Urticaria   ? ?Past Surgical History:  ?Procedure Laterality Date  ? COLONOSCOPY    ? NASAL SINUS SURGERY    ? TYMPANOSTOMY    ? VIDEO BRONCHOSCOPY Bilateral 07/16/2016  ? Procedure: VIDEO BRONCHOSCOPY WITH FLUORO;  Surgeon: Collene Gobble, MD;  Location: [facility];  Service: Cardiopulmonary;  Laterality: Bilateral;  ? ?Family History  ?Problem Relation Age of Onset  ? Rectal cancer Mother   ?     died age 31  ? Seizures Mother   ? Colon cancer Mother 81  ? Cancer Mother   ? Heart disease Father   ?     dies age 43  ? Hypertension Father   ? Heart disease Maternal Grandfather   ? AVM Brother   ? Cancer Maternal Grandmother   ? Cancer Paternal Uncle   ?     multiple uncles with cancer  ? Esophageal cancer Neg Hx   ? ?Social History  ? ?Socioeconomic History  ? Marital status: Widowed  ?  Spouse name: [name]  ? Number of children: 0  ? Years of education: BSN  ? Highest education level: Not on file  ?Occupational History  ? Occupation: retired Therapist, sports  ?Tobacco Use  ? Smoking status: Never  ? Smokeless tobacco: Never  ?Vaping Use  ? Vaping Use: Never used  ?Substance and Sexual Activity  ? Alcohol use: Yes  ?  Alcohol/week: 1.0 - 2.0 standard drink  ?  Types: 1 - 2 Glasses of wine per week  ?  Comment: 1-2 glasses of wine a month 05/29/21  ? Drug use: No  ? Sexual activity: Not Currently  ?Other Topics Concern  ? Not on file  ?Social History Narrative  ? ** Merged History Encounter **  ?    ? Lives with husband ?Caffeine use: 1 cup tea per day  ? ?Social Determinants of Health  ? ?Financial Resource Strain: Low Risk   ? Difficulty of Paying Living Expenses: Not  hard at all  ?Food Insecurity: No Food Insecurity  ? Worried About Charity fundraiser in the Last Year: Never true  ? Ran Out of Food in the Last Year: Never true  ?Transportation Needs: No Transportation Needs  ? Lack of Transportation (Medical): No  ? Lack of Transportation (Non-Medical): No  ?Physical Activity: Insufficiently Active  ? Days of Exercise per Week: 7 days  ? Minutes of Exercise per Session: 20 min  ?Stress: No Stress Concern Present  ? Feeling of Stress : Not at all  ?Social Connections: Unknown  ? Frequency of Communication with Friends and Family: More than three times a week  ? Frequency of Social Gatherings  with Friends and Family: Once a week  ? Attends Religious Services: Patient refused  ? Active Member of Clubs or Organizations: Patient refused  ? Attends Archivist Meetings: Patient refused  ? Marital Status: Widowed  ? ? ?Tobacco Counseling ?Counseling given: Not Answered ? ? ?Clinical Intake: ? ?Pre-visit preparation completed: Yes ? ?Pain : 0-10 ?Pain Score: 4  ?Pain Type: Chronic pain ?Pain Location: Knee ?Pain Orientation: Right, Left ?Pain Descriptors / Indicators: Constant ?Pain Onset: More than a month ago ?Pain Frequency: Constant ?Pain Relieving Factors: No pain medication ?Effect of Pain on Daily Activities: Pain produces disability and affects the quality of life. ? ?Pain Relieving Factors: No pain medication ? ?Nutritional Risks: None ?Diabetes: No ? ?How often do you need to have someone help you when you read instructions, pamphlets, or other written materials from your doctor or pharmacy?: 1 - Never ?What is the last grade level you completed in school?: Bachelor of Science in Nursing ? ?Diabetic? no ? ?Interpreter Needed?: No ? ?Information entered by :: Lisette Abu, LPN ? ? ?Activities of Daily Living ?In your present state of health, do you have any difficulty performing the following activities: 07/17/2021  ?Hearing? Y  ?Vision? N  ?Difficulty  concentrating or making decisions? N  ?Walking or climbing stairs? N  ?Dressing or bathing? N  ?Doing errands, shopping? N  ?Preparing Food and eating ? N  ?Using the Toilet? N  ?In the past six months, have you accidently

## 2021-07-18 ENCOUNTER — Other Ambulatory Visit: Payer: Self-pay | Admitting: Internal Medicine

## 2021-07-18 ENCOUNTER — Other Ambulatory Visit (INDEPENDENT_AMBULATORY_CARE_PROVIDER_SITE_OTHER): Payer: Medicare Other

## 2021-07-18 DIAGNOSIS — R300 Dysuria: Secondary | ICD-10-CM

## 2021-07-18 LAB — URINALYSIS
Bilirubin Urine: NEGATIVE
Ketones, ur: NEGATIVE
Leukocytes,Ua: NEGATIVE
Nitrite: NEGATIVE
Specific Gravity, Urine: 1.01 (ref 1.000–1.030)
Total Protein, Urine: NEGATIVE
Urine Glucose: NEGATIVE
Urobilinogen, UA: 0.2 (ref 0.0–1.0)
pH: 7 (ref 5.0–8.0)

## 2021-07-23 DIAGNOSIS — Z411 Encounter for cosmetic surgery: Secondary | ICD-10-CM | POA: Diagnosis not present

## 2021-07-23 DIAGNOSIS — L570 Actinic keratosis: Secondary | ICD-10-CM | POA: Diagnosis not present

## 2021-07-23 DIAGNOSIS — Z86018 Personal history of other benign neoplasm: Secondary | ICD-10-CM | POA: Diagnosis not present

## 2021-07-23 DIAGNOSIS — D2262 Melanocytic nevi of left upper limb, including shoulder: Secondary | ICD-10-CM | POA: Diagnosis not present

## 2021-07-23 DIAGNOSIS — L578 Other skin changes due to chronic exposure to nonionizing radiation: Secondary | ICD-10-CM | POA: Diagnosis not present

## 2021-07-23 DIAGNOSIS — L7211 Pilar cyst: Secondary | ICD-10-CM | POA: Diagnosis not present

## 2021-07-23 DIAGNOSIS — L821 Other seborrheic keratosis: Secondary | ICD-10-CM | POA: Diagnosis not present

## 2021-07-23 DIAGNOSIS — L720 Epidermal cyst: Secondary | ICD-10-CM | POA: Diagnosis not present

## 2021-07-30 DIAGNOSIS — Z1231 Encounter for screening mammogram for malignant neoplasm of breast: Secondary | ICD-10-CM | POA: Diagnosis not present

## 2021-07-30 LAB — HM MAMMOGRAPHY

## 2021-08-01 ENCOUNTER — Telehealth: Payer: Self-pay | Admitting: Emergency Medicine

## 2021-08-01 MED ORDER — MOLNUPIRAVIR EUA 200MG CAPSULE: 4.0000 | ORAL_CAPSULE | Freq: Two times a day (BID) | ORAL | 0 refills | Status: AC

## 2021-08-01 NOTE — Telephone Encounter (Signed)
Called and spoke to patient.  ?She stated that she tested positive for covid this morning.  ?C/o sneezing, runny nose, headache, postnasal drip, non prod cough, nausea, low grade temp of 100 and HR of 116 at rest x1d. ?Hx of a-fib.  ?Currently taking prednisone '10mg'$  for RA.  ?No supplemental oxygen. Spo2 maintaining between 96-98%. ? ?Dr. Lamonte Sakai, please advise. Thanks ? ?

## 2021-08-01 NOTE — Telephone Encounter (Signed)
Please ask her to start molnupiravir '800mg'$  bid x 5 days.  ?Call us if she is worsening in any way ?

## 2021-08-01 NOTE — Telephone Encounter (Signed)
Called and spoke with pt letting her know recs per RB and she verbalized understanding. Rx has been sent to preferred pharmacy for pt. Nothing further needed. ?

## 2021-08-02 ENCOUNTER — Telehealth: Payer: TRICARE For Life (TFL) | Admitting: Family Medicine

## 2021-08-03 ENCOUNTER — Encounter: Payer: Self-pay | Admitting: Internal Medicine

## 2021-08-04 ENCOUNTER — Telehealth: Payer: Self-pay | Admitting: Internal Medicine

## 2021-08-04 NOTE — Telephone Encounter (Signed)
Fully vaccinated for covid ?Started 07/31/20 rhinitis / feeling bad ?08/01/20 molnupiravir  rx per Dr Lamonte Sakai ?3/30 loose stools and abd cramps and no appetite> rec d/c antiviral ? ?Call Dr Lamonte Sakai or PCP on 08/06/21 if not better p off monupiravir and on clear lquids x 48 h ?To er in meantime if much worse  ? ?

## 2021-08-06 ENCOUNTER — Encounter: Payer: Self-pay | Admitting: Emergency Medicine

## 2021-08-06 NOTE — Telephone Encounter (Signed)
Thank you for update on the medication ?

## 2021-08-06 NOTE — Telephone Encounter (Signed)
Please advise on second part of message.  ?

## 2021-08-06 NOTE — Telephone Encounter (Signed)
Dr. Lamonte Sakai, please see recent messages sent by pt and advise. ?

## 2021-08-06 NOTE — Telephone Encounter (Signed)
Please advise on mychart message.

## 2021-08-06 NOTE — Telephone Encounter (Signed)
Thank you :)

## 2021-08-06 NOTE — Telephone Encounter (Signed)
I would hold off on adding back the antiviral.  We will have to use symptomatic relief to let the upper respiratory component run its course.  Try Tylenol Cold and flu, over-the-counter relief.  Could consider a brief run of increased prednisone (she is on 10 mg currently).  If she agrees ask her to take 30 mg daily for 3 days, 20 mg daily for 3 days and then back down to 10 mg. ?

## 2021-08-08 ENCOUNTER — Telehealth (INDEPENDENT_AMBULATORY_CARE_PROVIDER_SITE_OTHER): Payer: Medicare Other | Admitting: Primary Care

## 2021-08-08 DIAGNOSIS — U071 COVID-19: Secondary | ICD-10-CM

## 2021-08-08 NOTE — Progress Notes (Signed)
Virtual Visit via Video Note ? ?I connected with Monica [name] on 08/08/21 at 11:00 AM EDT by a video enabled telemedicine application and verified that I am speaking with the correct person using two identifiers. ? ?Location: ?Patient: Home ?Provider: Office  ?  ?I discussed the limitations of evaluation and management by telemedicine and the availability of in person appointments. The patient expressed understanding and agreed to proceed. ? ?History of Present Illness: ?76 year old female. PMH bronchiectasis, allergic rhinitis, chronic sinusitis, paroxysmal A-fib, GERD, osteoporosis, rheumatoid arthritis. Patient of Dr. Lamonte [name], last seen in office on 05/29/2021. ? ?08/08/2021 ?Patient contacted today for virtual visit, unfortunately she was unable to access video portion so this was switched to televisit.  She tested positive for with covid on Wednesday March 29th. Her symptoms started 1 day prior to this. She had been running a fever which has since resolved. Thuesday morning she had an episode of loose stool. She was fine Friday. Monica [name] she had another loose stool. She than stated having abdominal discomfort. She was nauseous and dizzy. Her antiviral, molnupiravir, was discontinued after 3 days. She started liquid diet. She reports feeling better but got over confident yesterday and had another loose stool. She went back to bland diet last night. She had one more loose stool this morning.  ? ?She was doing a lot of coughing. She has been on steriods since February for RA. Her rheumatologist is slowly weaning down her steroids, currently on '10mg'$ . Due to covid infection she was recommended to increased steroids to '30mg'$  x 3 days, '20mg'$  x 3 days then resume '10mg'$ . Cough has improved, she didn't have much yesterday. She is no longer constantly coughing. Went from dry cough and loose cough. She will occasionally get mucus up which is clear. No associated shortness of breath or chest discomfort.  ?   ?Observations/Objective: ? ?- Able to speak in full sentences; No overt shortness of breath, wheezing or cough  ? ?Assessment and Plan: ? ?Covid-19  ?- Tested positive on Wednesday, March 29th. Treated with Molnupiravir and prednisone taper but antiviral was discontinued after 3 days d/t diarrhea. She continues to have intermittent loose stools. Recommend she follow BRAT diet and take imodium as needed. Continue prednisone taper as instructed and then resume '10mg'$  daily per rheumatologist for RA.  ? ?Follow Up Instructions: ? ?- Call as needed for worsening symptoms or if she fails to improve  ?  ?I discussed the assessment and treatment plan with the patient. The patient was provided an opportunity to ask questions and all were answered. The patient agreed with the plan and demonstrated an understanding of the instructions. ?  ?The patient was advised to call back or seek an in-person evaluation if the symptoms worsen or if the condition fails to improve as anticipated. ? ?I provided 22 minutes of non-face-to-face time during this encounter. ? ? ?Martyn Ehrich, NP ? ?

## 2021-08-10 ENCOUNTER — Emergency Department (HOSPITAL_COMMUNITY): Payer: Medicare Other

## 2021-08-10 ENCOUNTER — Emergency Department (HOSPITAL_COMMUNITY)
Admission: EM | Admit: 2021-08-10 | Discharge: 2021-08-10 | Disposition: A | Discharge status: Home / Self Care | Payer: Medicare Other | Attending: Emergency Medicine | Admitting: Emergency Medicine

## 2021-08-10 ENCOUNTER — Other Ambulatory Visit: Payer: Self-pay

## 2021-08-10 DIAGNOSIS — R509 Fever, unspecified: Secondary | ICD-10-CM | POA: Diagnosis not present

## 2021-08-10 DIAGNOSIS — U071 COVID-19: Secondary | ICD-10-CM

## 2021-08-10 DIAGNOSIS — J9811 Atelectasis: Secondary | ICD-10-CM | POA: Diagnosis not present

## 2021-08-10 DIAGNOSIS — Z7901 Long term (current) use of anticoagulants: Secondary | ICD-10-CM | POA: Diagnosis not present

## 2021-08-10 DIAGNOSIS — J029 Acute pharyngitis, unspecified: Secondary | ICD-10-CM | POA: Diagnosis not present

## 2021-08-10 DIAGNOSIS — Z8616 Personal history of COVID-19: Secondary | ICD-10-CM | POA: Insufficient documentation

## 2021-08-10 DIAGNOSIS — R0602 Shortness of breath: Secondary | ICD-10-CM | POA: Insufficient documentation

## 2021-08-10 DIAGNOSIS — R059 Cough, unspecified: Secondary | ICD-10-CM | POA: Diagnosis not present

## 2021-08-10 NOTE — ED Provider Notes (Signed)
?[facility] DEPT ?Provider Note ? ? ?CSN: [id number] ?Arrival date & time: 08/10/21  2128 ? ?  ? ?History ? ?Chief Complaint  ?Patient presents with  ? Shortness of Breath  ? ? ?Monica [name] is a 76 y.o. female. ? ?76 year old female recently diagnosed with COVID 9 days ago presents with cough.  Patient also has history rheumatoid arthritis and is currently on a steroid taper.  Patient also has completed a course of antivirals for COVID.  Denies any fever or chills.  She has not been short of breath.  She is currently taking over-the-counter medications for cough.  No chest pain or pleuritic symptoms.  No leg pain or swelling.  She is very frustrated that she is not getting better.  Called EMS and pulse oximetry was 90% on room air.  She was afebrile. ? ? ?  ? ?Home Medications ?Prior to Admission medications   ?Medication Sig Start Date End Date Taking? Authorizing Provider  ?acetaminophen (TYLENOL) 500 MG tablet Take 500 mg by mouth every 6 (six) hours as needed for moderate pain or headache.    [provider]  ?calcium carbonate (TUMS EX) 750 MG chewable tablet Chew 1,500 mg by mouth at bedtime.    [provider]  ?CALCIUM-MAGNESIUM-ZINC PO Take 1 tablet by mouth daily.    [provider]  ?Cholecalciferol (VITAMIN D) 1000 UNITS capsule Take 1,000 Units by mouth daily.    [provider]  ?dextromethorphan (DELSYM) 30 MG/5ML liquid Take 30 mg by mouth at bedtime as needed for cough.     [provider]  ?ELIQUIS 5 MG TABS tablet TAKE 1 TABLET TWICE A DAY 05/21/21   Croitoru, Mihai, MD  ?Estradiol 10 MCG TABS Place 10 mcg vaginally 3 (three) times a week.    [provider]  ?famotidine (PEPCID) 40 MG tablet Take 1 tablet (40 mg total) by mouth 2 (two) times daily. ?Patient taking differently: Take 40 mg by mouth daily. 08/16/19 07/14/21  Ladene Artist, MD  ?Lactobacillus Rhamnosus, GG, (CULTURELLE PO) Take 1 tablet by mouth  daily.    [provider]  ?loratadine (CLARITIN) 10 MG tablet Take 10 mg by mouth daily as needed for allergies.    [provider]  ?metoprolol succinate (TOPROL XL) 25 MG 24 hr tablet Take 1 tablet (25 mg total) by mouth in the morning and at bedtime. 06/20/21   Vickie Epley, MD  ?Multiple Vitamin (MULTIVITAMIN WITH MINERALS) TABS tablet Take 1 tablet by mouth every evening. Centrum Silver    [provider]  ?predniSONE (DELTASONE) 10 MG tablet Take 3 tablets (30 mg total) by mouth daily with breakfast. ?Patient taking differently: Take 10 mg by mouth daily with breakfast. 06/27/21   Plotnikov, Evie Lacks, MD  ?Respiratory Therapy Supplies (FLUTTER) DEVI Twice a day and prn as needed, may increase if feeling worse 01/30/18   Lauraine Rinne, NP  ?   ? ?Allergies    ?Albuterol, Amoxicillin-pot clavulanate, Avelox [moxifloxacin hcl in nacl], Bactrim [sulfamethoxazole-trimethoprim], Benzonatate, Ciprofloxacin, and Macrobid [nitrofurantoin macrocrystal]   ? ?Review of Systems   ?Review of Systems  ?All other systems reviewed and are negative. ? ?Physical Exam ?Updated Vital Signs ?BP (!) 147/91 (BP Location: Right Arm)   Pulse 96   Temp 98.5 ?F (36.9 ?C) (Oral)   Resp 18   SpO2 98%  ?Physical Exam ?Vitals and nursing note reviewed.  ?Constitutional:   ?   General: She is not in  acute distress. ?   Appearance: Normal appearance. She is well-developed. She is not toxic-appearing.  ?HENT:  ?   Head: Normocephalic and atraumatic.  ?Eyes:  ?   General: Lids are normal.  ?   Conjunctiva/sclera: Conjunctivae normal.  ?   Pupils: Pupils are equal, round, and reactive to light.  ?Neck:  ?   Thyroid: No thyroid mass.  ?   Trachea: No tracheal deviation.  ?Cardiovascular:  ?   Rate and Rhythm: Normal rate and regular rhythm.  ?   Heart sounds: Normal heart sounds. No murmur heard. ?  No gallop.  ?Pulmonary:  ?   Effort: Pulmonary effort is normal. No respiratory distress.  ?   Breath sounds:  Normal breath sounds. No stridor. No decreased breath sounds, wheezing, rhonchi or rales.  ?Abdominal:  ?   General: There is no distension.  ?   Palpations: Abdomen is soft.  ?   Tenderness: There is no abdominal tenderness. There is no rebound.  ?Musculoskeletal:     ?   General: No tenderness. Normal range of motion.  ?   Cervical back: Normal range of motion and neck supple.  ?Skin: ?   General: Skin is warm and dry.  ?   Findings: No abrasion or rash.  ?Neurological:  ?   Mental Status: She is alert and oriented to person, place, and time. Mental status is at baseline.  ?   GCS: GCS eye subscore is 4. GCS verbal subscore is 5. GCS motor subscore is 6.  ?   Cranial Nerves: No cranial nerve deficit.  ?   Sensory: No sensory deficit.  ?   Motor: Motor function is intact.  ?Psychiatric:     ?   Attention and Perception: Attention normal.     ?   Speech: Speech normal.     ?   Behavior: Behavior normal.  ? ? ?ED Results / Procedures / Treatments   ?Labs ?(all labs ordered are listed, but only abnormal results are displayed) ?Labs Reviewed - No data to display ? ?EKG ?None ? ?Radiology ?No results found. ? ?Procedures ?Procedures  ? ? ?Medications Ordered in ED ?Medications - No data to display ? ?ED Course/ Medical Decision Making/ A&P ?  ?                        ?Medical Decision Making ?Amount and/or Complexity of Data Reviewed ?Radiology: ordered. ? ? ?Patient's chest x-ray per my interpretation shows no large infiltrates.  Patient has had a full treatment course for COVID.  Consider other causes of her symptoms such as pulmonary embolus but feel less unlikely.  She has stable vital signs.  She has not had any pleuritic chest pain.  She is not dyspneic.  Patient with likely resolving COVID illness and is stable for discharge ? ? ? ? ? ? ? ?Final Clinical Impression(s) / ED Diagnoses ?Final diagnoses:  ?None  ? ? ?Rx / DC Orders ?ED Discharge Orders   ? ? None  ? ?  ? ? ?  ?Lacretia Leigh, MD ?08/10/21 2257 ? ?

## 2021-08-10 NOTE — ED Triage Notes (Signed)
Positive Covid 9 days ago.  Today has had increased cough and sore throat.  Wanted to get checked out.  Sats good with EMS.  Ambulatory. VSS 138/86, HR 100, 99% on RA, temp 96.8, RR 22.  ?

## 2021-08-16 ENCOUNTER — Encounter: Payer: Self-pay | Admitting: Family Medicine

## 2021-08-16 ENCOUNTER — Telehealth (INDEPENDENT_AMBULATORY_CARE_PROVIDER_SITE_OTHER): Payer: Medicare Other | Admitting: Family Medicine

## 2021-08-16 DIAGNOSIS — R109 Unspecified abdominal pain: Secondary | ICD-10-CM

## 2021-08-16 DIAGNOSIS — U071 COVID-19: Secondary | ICD-10-CM | POA: Diagnosis not present

## 2021-08-16 DIAGNOSIS — R197 Diarrhea, unspecified: Secondary | ICD-10-CM

## 2021-08-16 NOTE — Progress Notes (Signed)
? ? ?MyChart Video Visit ? ? ? ?Virtual Visit via Video Note  ? ?This visit type was conducted due to national recommendations for restrictions regarding the COVID-19 Pandemic (e.g. social distancing) in an effort to limit this patient's exposure and mitigate transmission in our community. This patient is at least at moderate risk for complications without adequate follow up. This format is felt to be most appropriate for this patient at this time. Physical exam was limited by quality of the video and audio technology used for the visit. CMA was able to get the patient set up on a video visit. ? ?Patient location: Home. Patient and provider in visit ?Provider location: Office ? ?I discussed the limitations of evaluation and management by telemedicine and the availability of in person appointments. The patient expressed understanding and agreed to proceed. ? ?Visit Date: 08/16/2021 ? ?Today's healthcare provider: Harland Dingwall, NP-C  ? ? ? ?Subjective:  ? ? Patient ID: Monica [name], female    DOB: [birth year], 75 y.o.   MRN: [id number] ? ?Chief Complaint  ?Patient presents with  ? Covid Positive  ?  3/29 sx include: abdominal pain, nausea, cough, nasal drainage and fatigue  ? ?States she tested positive for Covid 2 weeks ago. States she called her pulmonologist and they put her on molnupiravir. States she took it for 3 days and had to stop due to diarrhea and abdominal pain. Those symptoms resolved after stopping the medication but then she began having intermittent diarrhea again. Also having nausea no vomiting.  ?States bowel movements have been more normal recently but after eating Salmon last night she experienced looser stool and abdominal pain 4/10 but no diarrhea. Reports passing a lot of gas.  ?States she has Zofran ODT at home but wants to know if she can take it.   ? ?Coughing up loose yellow sputum. Lightheaded at times. Dull headache, mild sinus pressure, thick nasal drainage, clear but does not feel  like she has sinus infection.  ?States she is urinating normal, not dark. Drinking liquids and Ensure.  ?States she is frustrated that she is not better and that this illness has been lingering for so long.  ? ?No longer having fever, it broke last week. Denies post nasal drainage, chest pain (other than with cough), shortness of breath, and no vomiting.  ? ?She is weaning off of her oral prednisone, now on 7.5 mg dose.  ?She is on methotrexate.  ? ?She is taking folic acid.  ? ?Takes Eliquis and metoprolol for A-fib.  ? ?No other concerns.  ? ? ?HPI ?Chief Complaint  ?Patient presents with  ? Covid Positive  ?  3/29 sx include: abdominal pain, nausea, cough, nasal drainage and fatigue  ? ? ?Past Medical History:  ?Diagnosis Date  ? Adenomatous colon polyp 10/2003  ? Allergic rhinitis   ? Allergy   ? SEASONAL  ? Bronchiectasis   ? Chronic sinusitis   ? Diverticulosis   ? GERD (gastroesophageal reflux disease)   ? Hearing loss   ? Hemorrhoids   ? Insomnia   ? Menopausal disorder   ? Osteoporosis   ? Palpitations   ? Paroxysmal A-fib ([place])   ? TMJ syndrome   ? Urticaria   ? ? ?Past Surgical History:  ?Procedure Laterality Date  ? COLONOSCOPY    ? NASAL SINUS SURGERY    ? TYMPANOSTOMY    ? VIDEO BRONCHOSCOPY Bilateral 07/16/2016  ? Procedure: VIDEO BRONCHOSCOPY WITH FLUORO;  Surgeon: Collene Gobble,  MD;  Location: [facility];  Service: Cardiopulmonary;  Laterality: Bilateral;  ? ? ?Family History  ?Problem Relation Age of Onset  ? Rectal cancer Mother   ?     died age 70  ? Seizures Mother   ? Colon cancer Mother 42  ? Cancer Mother   ? Heart disease Father   ?     dies age 58  ? Hypertension Father   ? Heart disease Maternal Grandfather   ? AVM Brother   ? Cancer Maternal Grandmother   ? Cancer Paternal Uncle   ?     multiple uncles with cancer  ? Esophageal cancer Neg Hx   ? ? ?Social History  ? ?Socioeconomic History  ? Marital status: Widowed  ?  Spouse name: [name]  ? Number of children: 0  ? Years of education: BSN   ? Highest education level: Not on file  ?Occupational History  ? Occupation: retired Therapist, sports  ?Tobacco Use  ? Smoking status: Never  ? Smokeless tobacco: Never  ?Vaping Use  ? Vaping Use: Never used  ?Substance and Sexual Activity  ? Alcohol use: Yes  ?  Alcohol/week: 1.0 - 2.0 standard drink  ?  Types: 1 - 2 Glasses of wine per week  ?  Comment: 1-2 glasses of wine a month 05/29/21  ? Drug use: No  ? Sexual activity: Not Currently  ?Other Topics Concern  ? Not on file  ?Social History Narrative  ? ** Merged History Encounter **  ?    ? Lives with husband ?Caffeine use: 1 cup tea per day  ? ?Social Determinants of Health  ? ?Financial Resource Strain: Low Risk   ? Difficulty of Paying Living Expenses: Not hard at all  ?Food Insecurity: No Food Insecurity  ? Worried About Charity fundraiser in the Last Year: Never true  ? Ran Out of Food in the Last Year: Never true  ?Transportation Needs: No Transportation Needs  ? Lack of Transportation (Medical): No  ? Lack of Transportation (Non-Medical): No  ?Physical Activity: Insufficiently Active  ? Days of Exercise per Week: 7 days  ? Minutes of Exercise per Session: 20 min  ?Stress: No Stress Concern Present  ? Feeling of Stress : Not at all  ?Social Connections: Unknown  ? Frequency of Communication with Friends and Family: More than three times a week  ? Frequency of Social Gatherings with Friends and Family: Once a week  ? Attends Religious Services: Patient refused  ? Active Member of Clubs or Organizations: Patient refused  ? Attends Archivist Meetings: Patient refused  ? Marital Status: Widowed  ?Intimate Partner Violence: Not At Risk  ? Fear of Current or Ex-Partner: No  ? Emotionally Abused: No  ? Physically Abused: No  ? Sexually Abused: No  ? ? ?Outpatient Medications Prior to Visit  ?Medication Sig Dispense Refill  ? acetaminophen (TYLENOL) 500 MG tablet Take 500 mg by mouth every 6 (six) hours as needed for moderate pain or headache.    ? calcium  carbonate (TUMS EX) 750 MG chewable tablet Chew 1,500 mg by mouth at bedtime.    ? CALCIUM-MAGNESIUM-ZINC PO Take 1 tablet by mouth daily.    ? Cholecalciferol (VITAMIN D) 1000 UNITS capsule Take 1,000 Units by mouth daily.    ? dextromethorphan (DELSYM) 30 MG/5ML liquid Take 30 mg by mouth at bedtime as needed for cough.     ? ELIQUIS 5 MG TABS tablet TAKE 1 TABLET TWICE  A DAY 180 tablet 3  ? Estradiol 10 MCG TABS Place 10 mcg vaginally 3 (three) times a week.    ? Lactobacillus Rhamnosus, GG, (CULTURELLE PO) Take 1 tablet by mouth daily.    ? loratadine (CLARITIN) 10 MG tablet Take 10 mg by mouth daily as needed for allergies.    ? metoprolol succinate (TOPROL XL) 25 MG 24 hr tablet Take 1 tablet (25 mg total) by mouth in the morning and at bedtime. 180 tablet 3  ? Multiple Vitamin (MULTIVITAMIN WITH MINERALS) TABS tablet Take 1 tablet by mouth every evening. Centrum Silver    ? predniSONE (DELTASONE) 10 MG tablet Take 3 tablets (30 mg total) by mouth daily with breakfast. (Patient taking differently: Take 10 mg by mouth daily with breakfast.) 120 tablet 0  ? Respiratory Therapy Supplies (FLUTTER) DEVI Twice a day and prn as needed, may increase if feeling worse 1 each 0  ? famotidine (PEPCID) 40 MG tablet Take 1 tablet (40 mg total) by mouth 2 (two) times daily. (Patient taking differently: Take 40 mg by mouth daily.) 60 tablet 11  ? ?No facility-administered medications prior to visit.  ? ? ?Allergies  ?Allergen Reactions  ? Albuterol Other (See Comments)  ?  Patient states put her into atrial fib last time she took this medication  ? Amoxicillin-Pot Clavulanate Diarrhea  ?  Has patient had a PCN reaction causing immediate rash, facial/tongue/throat swelling, SOB or lightheadedness with hypotension:No ?Has patient had a PCN reaction causing severe rash involving mucus membranes or skin necrosis:No ?Has patient had a PCN reaction that required hospitalization:No ?Has patient had a PCN reaction occurring within  the last 10 years:Yes ?If all of the above answers are "NO", then may proceed with Cephalosporin use  ? Avelox [Moxifloxacin Hcl In Nacl] Nausea Only  ? Bactrim [Sulfamethoxazole-Trimethoprim] Other (See Comments

## 2021-08-20 ENCOUNTER — Encounter: Payer: Self-pay | Admitting: Internal Medicine

## 2021-08-23 DIAGNOSIS — Z20822 Contact with and (suspected) exposure to covid-19: Secondary | ICD-10-CM | POA: Diagnosis not present

## 2021-08-24 DIAGNOSIS — M0579 Rheumatoid arthritis with rheumatoid factor of multiple sites without organ or systems involvement: Secondary | ICD-10-CM | POA: Diagnosis not present

## 2021-08-24 DIAGNOSIS — Z79899 Other long term (current) drug therapy: Secondary | ICD-10-CM | POA: Diagnosis not present

## 2021-09-01 DIAGNOSIS — Z20822 Contact with and (suspected) exposure to covid-19: Secondary | ICD-10-CM | POA: Diagnosis not present

## 2021-09-04 DIAGNOSIS — Z20822 Contact with and (suspected) exposure to covid-19: Secondary | ICD-10-CM | POA: Diagnosis not present

## 2021-09-10 ENCOUNTER — Encounter: Payer: Self-pay | Admitting: Internal Medicine

## 2021-09-10 NOTE — Telephone Encounter (Signed)
Check [place] registry last filled 06/07/2021.Marland KitchenJohny Chess ? ?

## 2021-09-11 ENCOUNTER — Other Ambulatory Visit: Payer: Self-pay | Admitting: Internal Medicine

## 2021-09-11 MED ORDER — TRAMADOL HCL 50 MG PO TABS: 50.0000 mg | ORAL_TABLET | Freq: Three times a day (TID) | ORAL | 3 refills | Status: AC | PRN

## 2021-09-12 ENCOUNTER — Encounter (HOSPITAL_COMMUNITY): Payer: Self-pay

## 2021-09-12 ENCOUNTER — Emergency Department (HOSPITAL_COMMUNITY)
Admission: EM | Admit: 2021-09-12 | Discharge: 2021-09-13 | Disposition: A | Discharge status: Home / Self Care | Payer: Medicare Other | Attending: Emergency Medicine | Admitting: Emergency Medicine

## 2021-09-12 ENCOUNTER — Other Ambulatory Visit: Payer: Self-pay

## 2021-09-12 ENCOUNTER — Emergency Department (HOSPITAL_COMMUNITY): Payer: Medicare Other

## 2021-09-12 DIAGNOSIS — R051 Acute cough: Secondary | ICD-10-CM | POA: Diagnosis not present

## 2021-09-12 DIAGNOSIS — J479 Bronchiectasis, uncomplicated: Secondary | ICD-10-CM | POA: Diagnosis not present

## 2021-09-12 DIAGNOSIS — U071 COVID-19: Secondary | ICD-10-CM | POA: Insufficient documentation

## 2021-09-12 DIAGNOSIS — R9431 Abnormal electrocardiogram [ECG] [EKG]: Secondary | ICD-10-CM | POA: Diagnosis not present

## 2021-09-12 DIAGNOSIS — R059 Cough, unspecified: Secondary | ICD-10-CM | POA: Diagnosis not present

## 2021-09-12 DIAGNOSIS — R509 Fever, unspecified: Secondary | ICD-10-CM | POA: Diagnosis present

## 2021-09-12 DIAGNOSIS — R42 Dizziness and giddiness: Secondary | ICD-10-CM | POA: Diagnosis not present

## 2021-09-12 HISTORY — DX: Rheumatoid arthritis, unspecified: M06.9

## 2021-09-12 LAB — CBC WITH DIFFERENTIAL/PLATELET
Abs Immature Granulocytes: 0.1 10*3/uL — ABNORMAL HIGH (ref 0.00–0.07)
Basophils Absolute: 0 10*3/uL (ref 0.0–0.1)
Basophils Relative: 0 %
Eosinophils Absolute: 0 10*3/uL (ref 0.0–0.5)
Eosinophils Relative: 0 %
HCT: 36.3 % (ref 36.0–46.0)
Hemoglobin: 12.2 g/dL (ref 12.0–15.0)
Immature Granulocytes: 1 %
Lymphocytes Relative: 4 %
Lymphs Abs: 0.7 10*3/uL (ref 0.7–4.0)
MCH: 31.4 pg (ref 26.0–34.0)
MCHC: 33.6 g/dL (ref 30.0–36.0)
MCV: 93.6 fL (ref 80.0–100.0)
Monocytes Absolute: 0.9 10*3/uL (ref 0.1–1.0)
Monocytes Relative: 5 %
Neutro Abs: 16.2 10*3/uL — ABNORMAL HIGH (ref 1.7–7.7)
Neutrophils Relative %: 90 %
Platelets: 453 10*3/uL — ABNORMAL HIGH (ref 150–400)
RBC: 3.88 MIL/uL (ref 3.87–5.11)
RDW: 13.3 % (ref 11.5–15.5)
WBC: 17.9 10*3/uL — ABNORMAL HIGH (ref 4.0–10.5)
nRBC: 0 % (ref 0.0–0.2)

## 2021-09-12 LAB — COMPREHENSIVE METABOLIC PANEL
ALT: 13 U/L (ref 0–44)
AST: 19 U/L (ref 15–41)
Albumin: 3.2 g/dL — ABNORMAL LOW (ref 3.5–5.0)
Alkaline Phosphatase: 52 U/L (ref 38–126)
Anion gap: 8 (ref 5–15)
BUN: 17 mg/dL (ref 8–23)
CO2: 27 mmol/L (ref 22–32)
Calcium: 8.5 mg/dL — ABNORMAL LOW (ref 8.9–10.3)
Chloride: 100 mmol/L (ref 98–111)
Creatinine, Ser: 0.76 mg/dL (ref 0.44–1.00)
GFR, Estimated: >60 mL/min (ref >60)
Glucose, Bld: 115 mg/dL — ABNORMAL HIGH (ref 70–99)
Potassium: 3.8 mmol/L (ref 3.5–5.1)
Sodium: 135 mmol/L (ref 135–145)
Total Bilirubin: 0.4 mg/dL (ref 0.3–1.2)
Total Protein: 6.6 g/dL (ref 6.5–8.1)

## 2021-09-12 LAB — RESP PANEL BY RT-PCR (FLU A&B, COVID) ARPGX2
Influenza A by PCR: NEGATIVE
Influenza B by PCR: NEGATIVE
SARS Coronavirus 2 by RT PCR: POSITIVE — AB

## 2021-09-12 LAB — LACTIC ACID, PLASMA: Lactic Acid, Venous: 1.2 mmol/L (ref 0.5–1.9)

## 2021-09-12 MED ORDER — ACETAMINOPHEN 500 MG PO TABS
1000.0000 mg | ORAL_TABLET | Freq: Four times a day (QID) | ORAL | Status: DC | PRN
  Administered 2021-09-12: 500 mg via ORAL
  Filled 2021-09-12: qty 2

## 2021-09-12 MED ORDER — SODIUM CHLORIDE 0.9 % IV BOLUS
500.0000 mL | Freq: Once | INTRAVENOUS | Status: AC
  Administered 2021-09-12: 500 mL via INTRAVENOUS

## 2021-09-12 MED ORDER — MOLNUPIRAVIR EUA 200MG CAPSULE: 4.0000 | ORAL_CAPSULE | Freq: Two times a day (BID) | ORAL | 0 refills | Status: DC

## 2021-09-12 MED ORDER — DOXYCYCLINE HYCLATE 100 MG PO CAPS: 100.0000 mg | ORAL_CAPSULE | Freq: Two times a day (BID) | ORAL | 0 refills | Status: AC

## 2021-09-12 MED ORDER — MOLNUPIRAVIR EUA 200MG CAPSULE: 4.0000 | ORAL_CAPSULE | Freq: Two times a day (BID) | ORAL | 0 refills | Status: AC

## 2021-09-12 NOTE — ED Provider Notes (Signed)
? ?Emergency Department Provider Note ? ? ?I have reviewed the triage vital signs and the nursing notes. ? ? ?HISTORY ? ?Chief Complaint ?Cough, Fever, and Headache ? ? ?HPI ?Monica [name] is a 76 y.o. female with past medical history reviewed below including A-fib on anticoagulation, RA on methotrexate, history of bronchiectasis presents with cough, fever, fatigue.  She is having some chest discomfort only with cough along with headache.  She is feeling fatigued with some body aches.  She is taken to home COVID test which have both been negative.  Symptoms been developing over the past 3 to 4 days.  She has had some mild diarrhea today but no vomiting.  No recent antibiotics.  Her cough is more productive than normal.  Denies any hemoptysis. No sick contacts.   ? ? ? ?Past Medical History:  ?Diagnosis Date  ? Adenomatous colon polyp 10/2003  ? Allergic rhinitis   ? Allergy   ? SEASONAL  ? Bronchiectasis   ? Chronic sinusitis   ? Diverticulosis   ? GERD (gastroesophageal reflux disease)   ? Hearing loss   ? Hemorrhoids   ? Insomnia   ? Menopausal disorder   ? Osteoporosis   ? Palpitations   ? Paroxysmal A-fib ([place])   ? Rheumatoid arthritis ([place])   ? TMJ syndrome   ? Urticaria   ? ? ?Review of Systems ? ?Constitutional: Positive fever/chills and fatigue.  ?Eyes: No visual changes. ?ENT: No sore throat. ?Cardiovascular: Denies chest pain. ?Respiratory: Denies shortness of breath. Positive cough.  ?Gastrointestinal: No abdominal pain.  No nausea, no vomiting.  Mild diarrhea.  No constipation. ?Genitourinary: Negative for dysuria. ?Musculoskeletal: Negative for back pain. ?Skin: Negative for rash. ?Neurological: Negative for focal weakness or numbness. Positive HA.  ? ? ?____________________________________________ ? ? ?PHYSICAL EXAM: ? ?VITAL SIGNS: ?ED Triage Vitals  ?Enc Vitals Group  ?   BP 09/12/21 1937 (!) 179/86  ?   Pulse Rate 09/12/21 1937 (!) 114  ?   Resp 09/12/21 1937 17  ?   Temp 09/12/21 1937 98.5 ?F  (36.9 ?C)  ?   Temp Source 09/12/21 1937 Oral  ?   SpO2 09/12/21 1937 94 %  ?   Weight 09/12/21 1937 100 lb (45.4 kg)  ?   Height 09/12/21 1937 5' 1.5" (1.562 m)  ? ?Constitutional: Alert and oriented. Well appearing and in no acute distress. Patient with frequent cough.  ?Eyes: Conjunctivae are normal. ?Head: Atraumatic. ?Nose: No congestion/rhinnorhea. ?Mouth/Throat: Mucous membranes are moist.  ?Neck: No stridor.   ?Cardiovascular: Tachycardia. Good peripheral circulation. Grossly normal heart sounds.   ?Respiratory: Normal respiratory effort.  No retractions. Lungs with faint rhonchi at the bases, worse on the left. No wheezing. ?Gastrointestinal: Soft and nontender. No distention.  ?Musculoskeletal: No lower extremity tenderness nor edema. No gross deformities of extremities. ?Neurologic:  Normal speech and language. No gross focal neurologic deficits are appreciated.  ?Skin:  Skin is warm, dry and intact. No rash noted. ? ?____________________________________________ ?  ?LABS ?(all labs ordered are listed, but only abnormal results are displayed) ? ?Labs Reviewed  ?RESP PANEL BY RT-PCR (FLU A&B, COVID) ARPGX2 - Abnormal; Notable for the following components:  ?    Result Value  ? SARS Coronavirus 2 by RT PCR POSITIVE (*)   ? All other components within normal limits  ?CBC WITH DIFFERENTIAL/PLATELET - Abnormal; Notable for the following components:  ? WBC 17.9 (*)   ? Platelets 453 (*)   ? Neutro Abs  16.2 (*)   ? Abs Immature Granulocytes 0.10 (*)   ? All other components within normal limits  ?COMPREHENSIVE METABOLIC PANEL - Abnormal; Notable for the following components:  ? Glucose, Bld 115 (*)   ? Calcium 8.5 (*)   ? Albumin 3.2 (*)   ? All other components within normal limits  ?CULTURE, BLOOD (ROUTINE X 2)  ?CULTURE, BLOOD (ROUTINE X 2)  ?LACTIC ACID, PLASMA  ?LACTIC ACID, PLASMA  ?URINALYSIS, ROUTINE W REFLEX MICROSCOPIC  ? ?____________________________________________ ? ?EKG ? ? EKG  Interpretation ? ?Date/Time:  Wednesday Sep 12 2021 22:23:01 EDT ?Ventricular Rate:  104 ?PR Interval:  150 ?QRS Duration: 81 ?QT Interval:  327 ?QTC Calculation: 431 ?R Axis:   60 ?Text Interpretation: Sinus tachycardia Biatrial enlargement Confirmed by Nanda Quinton [phone]) on 09/12/2021 11:37:30 PM ?  ? ?  ? ? ?____________________________________________ ? ?RADIOLOGY ? ?DG Chest Portable 1 View ? ?Result Date: 09/12/2021 ?CLINICAL DATA:  Productive cough. Dizziness. History of bronchiectasis. EXAM: PORTABLE CHEST 1 VIEW COMPARISON:  Chest radiograph 08/10/2021.  CT 02/12/2021 FINDINGS: Heart is normal in size. Stable mediastinal contours. Stable ill-defined opacity in the right mid lung and both lower lung zones corresponding to bronchiectasis and sequela of indolent infection on prior CT. No superimposed acute airspace disease. There is stable biapical pleuroparenchymal scarring. No pleural effusion. No pneumothorax. No pulmonary edema. IMPRESSION: Stable chronic lung opacities corresponding to bronchiectasis and sequela of indolent infection on prior CT. No superimposed acute process. Electronically Signed   By: Keith Rake M.D.   On: 09/12/2021 20:38   ? ?____________________________________________ ? ? ?PROCEDURES ? ?Procedure(s) performed:  ? ?Procedures ? ?None  ?____________________________________________ ? ? ?INITIAL IMPRESSION / ASSESSMENT AND PLAN / ED COURSE ? ?Pertinent labs & imaging results that were available during my care of the patient were reviewed by me and considered in my medical decision making (see chart for details). ?  ?This patient is Presenting for Evaluation of fever/cough, which does require a range of treatment options, and is a complaint that involves a high risk of morbidity and mortality. ? ?The Differential Diagnoses include CAP, bronchitis, viral process, asthma, PE, ACS. ? ?Critical Interventions-  ?  ?Medications  ?acetaminophen (TYLENOL) tablet 1,000 mg (500 mg Oral  Given 09/12/21 2158)  ?sodium chloride 0.9 % bolus 500 mL (500 mLs Intravenous New Bag/Given 09/12/21 2219)  ? ? ?Reassessment after intervention:  HR improved.  ? ?I decided to review pertinent External Data, and in summary last ED visit was 08/10/21 with SOB post COVID. ?  ?Clinical Laboratory Tests Ordered, included patient's COVID PCR test is coming back positive.  She reports testing positive in late March.  We are close to 2 months later and she had return of the exact symptoms that she had with COVID in the past.  Consider that this is a residual positive test but with new symptoms plan to treat with antivirals.  ? ?Radiologic Tests Ordered, included CXR. I independently interpreted the images and agree with radiology interpretation.  ? ?Cardiac Monitor Tracing which shows NSR. ? ? ?Social Determinants of Health Risk patient is a non-smoker.  ? ?Consult complete with ? ?Medical Decision Making: Summary:  ?Patient presents to the emergency department with cough along with fever and body aches.  Mild tachycardia in triage but improved here.  Will need further eval for sepsis work-up but overall lower suspicion for this.  Patient does have added risk of methotrexate and history of bronchiectasis.  No obvious pneumonia on chest  x-ray although on exam seems to have some rhonchi at the bases and clinically my suspicion for pneumonia is elevated.  No hypoxemia or increased work of breathing on my initial assessment.  ? ?Reevaluation with update and discussion with patient.  As discussed above, will treat with antiviral therapy.  Lactic acid is normal.  She does have a leukocytosis but no clear findings to strongly suspect sepsis.  No hypoxemia or clear community-acquired pneumonia.  I will cover her with doxycycline given her history of bronchiectasis.  I discussed the doxycycline use along with her methotrexate with pharmacy on-call.  ? ?Disposition: discharge ? ?____________________________________________ ? ?FINAL  CLINICAL IMPRESSION(S) / ED DIAGNOSES ? ?Final diagnoses:  ?COVID-19  ?Acute cough  ? ? ? ?NEW OUTPATIENT MEDICATIONS STARTED DURING THIS VISIT: ? ?New Prescriptions  ? DOXYCYCLINE (VIBRAMYCIN) 100 MG CAPSULE    Take 1 capsule (1

## 2021-09-12 NOTE — Discharge Instructions (Addendum)
You are seen in the emergency department today with fever and cough.  Your COVID test does come back positive I think given your return of symptoms it would be reasonable to start you on antiviral medications.  I am also starting you on an antibiotic treating for bronchitis and possible developing pneumonia.  Please continue to follow with your primary care doctor and return with any new or suddenly worsening symptoms. ?

## 2021-09-12 NOTE — ED Triage Notes (Signed)
Pt reports with cough, fever, and headache x 3 days. Pt reports coughing up sputum that is yellow in color.  ?

## 2021-09-15 ENCOUNTER — Telehealth: Payer: Self-pay | Admitting: Student

## 2021-09-15 MED ORDER — METOPROLOL SUCCINATE ER 25 MG PO TB24: 37.5000 mg | ORAL_TABLET | Freq: Two times a day (BID) | ORAL | 0 refills | Status: DC

## 2021-09-15 NOTE — Telephone Encounter (Addendum)
? ?  The patient called the after-hours line reporting she has been experiencing more frequent episodes of atrial fibrillation. She is on steroids for RA and was also recently diagnosed with COVID. She reports good compliance with Toprol-XL 25 mg BID and in reviewing her last EP note, there was mention of possibly further titrating this if needed. Reviewed this with the patient today and she will titrate dosing to 37.5 mg twice daily. I encouraged her to follow vitals with this change. A new Rx was sent to her pharmacy. She voiced understanding of this information and was appreciative of the return call. Will route to Dr. Sallyanne Kuster as an Juluis Rainier.  ? ?Signed, ?Erma Heritage, PA-C ?09/15/2021, 2:43 PM ? ? ?

## 2021-09-15 NOTE — Telephone Encounter (Signed)
Thanks, B! ?

## 2021-09-18 ENCOUNTER — Telehealth (INDEPENDENT_AMBULATORY_CARE_PROVIDER_SITE_OTHER): Payer: Medicare Other | Admitting: Emergency Medicine

## 2021-09-18 ENCOUNTER — Encounter: Payer: Self-pay | Admitting: Emergency Medicine

## 2021-09-18 DIAGNOSIS — U071 COVID-19: Secondary | ICD-10-CM

## 2021-09-18 DIAGNOSIS — R6889 Other general symptoms and signs: Secondary | ICD-10-CM

## 2021-09-18 DIAGNOSIS — Z8739 Personal history of other diseases of the musculoskeletal system and connective tissue: Secondary | ICD-10-CM

## 2021-09-18 LAB — CULTURE, BLOOD (ROUTINE X 2)
Culture: NO GROWTH
Culture: NO GROWTH
Special Requests: ADEQUATE
Special Requests: ADEQUATE

## 2021-09-18 NOTE — Progress Notes (Signed)
? ? ?Telemedicine Encounter- SOAP NOTE Established Patient ?MyChart video conference attempted without success ?Patient: Monica [name]  ?Provider: Office   ?Patient present only ? ?This telephone encounter was conducted with the patient's (or proxy's) verbal consent via audio telecommunications: yes/no: Yes ?Patient was instructed to have this encounter in a suitably private space; and to only have persons present to whom they give permission to participate. In addition, patient identity was confirmed by use of name plus two identifiers (DOB and address).  I discussed the limitations, risks, security and privacy concerns of performing an evaluation and management service by telephone and the availability of in person appointments. I also discussed with the patient that there may be a patient responsible charge related to this service. The patient expressed understanding and agreed to proceed. ? ?I spent a total of TIME; 0 MIN TO 60 MIN: 25 minutes talking with the patient or their proxy. ? ?Chief complaint: COVID infection with flu like symptoms ? ?Subjective  ?Acute problem visit today.  Patient of Dr. [name] ?Monica [name] is a 76 y.o. female established patient. Telephone visit today complaining of flulike symptoms that started on 09/08/2021. ?Emergency department visit on 09/12/2021 when she was diagnosed with COVID infection and started on molnupiravir and doxycycline ?Has history of rheumatoid arthritis on prednisone 7.5 mg daily and weekly methotrexate. ?Also has history of bronchiectasis. ?Unremarkable chest x-ray.  Blood work in the ER showed increased white blood cell count of 17.9 probably related to both infection and prednisone. ?Denies difficulty breathing.  Mostly coughing.  Has some sinus congestion and still running fevers of 101. ?Able to eat and drink.  Denies nausea or vomiting.  Denies abdominal pain or diarrhea. ?No other complaints or medical concerns today. ? ?HPI ? ? ?Patient Active Problem  List  ? Diagnosis Date Noted  ? Rheumatoid arthritis ([place]) 05/30/2021  ? Abnormal TSH 05/30/2021  ? Secondary hypercoagulable state ([place]) 05/29/2021  ? Constipation 02/28/2020  ? Dysuria 09/21/2018  ? Abdominal pain 09/21/2018  ? Sepsis ([place]) 06/07/2018  ? Thrush, oral 02/10/2018  ? Weight loss 11/12/2017  ? Acute intractable headache 12/27/2016  ? Cough 05/27/2016  ? Dizziness 01/28/2016  ? Headache 09/29/2015  ? Paroxysmal atrial fibrillation ([place]) 09/19/2015  ? Allergic reaction 03/22/2015  ? Mastoiditis of right side 03/13/2015  ? Chest pain, atypical 04/13/2014  ? Healthcare maintenance 02/16/2013  ? Elevated CA-125 02/16/2013  ? IBS (irritable bowel syndrome) 11/25/2010  ? ANXIETY DISORDER, SITUATIONAL, MILD 10/20/2009  ? ALLERGIC RHINITIS, SEASONAL 04/23/2007  ? GERD 03/10/2007  ? Insomnia disorder 01/26/2007  ? HEARING LOSS, LEFT EAR 01/26/2007  ? Sinusitis, chronic 01/26/2007  ? Bronchiectasis without acute exacerbation ([place]) 01/26/2007  ? TMJ SYNDROME 01/26/2007  ? MENOPAUSAL DISORDER 01/26/2007  ? OSTEOPOROSIS 01/26/2007  ? Palpitations 01/26/2007  ? ? ?Past Medical History:  ?Diagnosis Date  ? Adenomatous colon polyp 10/2003  ? Allergic rhinitis   ? Allergy   ? SEASONAL  ? Bronchiectasis   ? Chronic sinusitis   ? Diverticulosis   ? GERD (gastroesophageal reflux disease)   ? Hearing loss   ? Hemorrhoids   ? Insomnia   ? Menopausal disorder   ? Osteoporosis   ? Palpitations   ? Paroxysmal A-fib ([place])   ? Rheumatoid arthritis ([place])   ? TMJ syndrome   ? Urticaria   ? ? ?Current Outpatient Medications  ?Medication Sig Dispense Refill  ? acetaminophen (TYLENOL) 500 MG tablet Take 500 mg by mouth every 6 (six) hours  as needed for moderate pain or headache.    ? calcium carbonate (TUMS EX) 750 MG chewable tablet Chew 1,500 mg by mouth at bedtime.    ? CALCIUM-MAGNESIUM-ZINC PO Take 1 tablet by mouth daily.    ? Cholecalciferol (VITAMIN D) 1000 UNITS capsule Take 1,000 Units by mouth daily.    ? dextromethorphan  (DELSYM) 30 MG/5ML liquid Take 30 mg by mouth at bedtime as needed for cough.     ? doxycycline (VIBRAMYCIN) 100 MG capsule Take 1 capsule (100 mg total) by mouth 2 (two) times daily for 7 days. 14 capsule 0  ? ELIQUIS 5 MG TABS tablet TAKE 1 TABLET TWICE A DAY 180 tablet 3  ? Estradiol 10 MCG TABS Place 10 mcg vaginally 3 (three) times a week.    ? famotidine (PEPCID) 40 MG tablet Take 1 tablet (40 mg total) by mouth 2 (two) times daily. (Patient taking differently: Take 40 mg by mouth daily.) 60 tablet 11  ? Lactobacillus Rhamnosus, GG, (CULTURELLE PO) Take 1 tablet by mouth daily.    ? loratadine (CLARITIN) 10 MG tablet Take 10 mg by mouth daily as needed for allergies.    ? metoprolol succinate (TOPROL XL) 25 MG 24 hr tablet Take 1.5 tablets (37.5 mg total) by mouth in the morning and at bedtime. 180 tablet 0  ? Multiple Vitamin (MULTIVITAMIN WITH MINERALS) TABS tablet Take 1 tablet by mouth every evening. Centrum Silver    ? predniSONE (DELTASONE) 10 MG tablet Take 3 tablets (30 mg total) by mouth daily with breakfast. (Patient taking differently: Take 10 mg by mouth daily with breakfast.) 120 tablet 0  ? Respiratory Therapy Supplies (FLUTTER) DEVI Twice a day and prn as needed, may increase if feeling worse 1 each 0  ? ?No current facility-administered medications for this visit.  ? ? ?Allergies  ?Allergen Reactions  ? Albuterol Other (See Comments)  ?  Patient states put her into atrial fib last time she took this medication  ? Amoxicillin-Pot Clavulanate Diarrhea  ?  Has patient had a PCN reaction causing immediate rash, facial/tongue/throat swelling, SOB or lightheadedness with hypotension:No ?Has patient had a PCN reaction causing severe rash involving mucus membranes or skin necrosis:No ?Has patient had a PCN reaction that required hospitalization:No ?Has patient had a PCN reaction occurring within the last 10 years:Yes ?If all of the above answers are "NO", then may proceed with Cephalosporin use  ?  Avelox [Moxifloxacin Hcl In Nacl] Nausea Only  ? Bactrim [Sulfamethoxazole-Trimethoprim] Other (See Comments)  ?  flushing  ? Benzonatate Other (See Comments)  ?  felt bad, out of sorts, disoriented.  ? Ciprofloxacin Rash  ?  Rash across abdomen  ? Macrobid [Nitrofurantoin Macrocrystal] Diarrhea  ?  Diarrhea, nausea  ? ? ?Social History  ? ?Socioeconomic History  ? Marital status: Widowed  ?  Spouse name: [name]  ? Number of children: 0  ? Years of education: BSN  ? Highest education level: Not on file  ?Occupational History  ? Occupation: retired Therapist, sports  ?Tobacco Use  ? Smoking status: Never  ? Smokeless tobacco: Never  ?Vaping Use  ? Vaping Use: Never used  ?Substance and Sexual Activity  ? Alcohol use: Yes  ?  Alcohol/week: 1.0 - 2.0 standard drink  ?  Types: 1 - 2 Glasses of wine per week  ?  Comment: 1-2 glasses of wine a month 05/29/21  ? Drug use: No  ? Sexual activity: Not Currently  ?Other Topics Concern  ?  Not on file  ?Social History Narrative  ? ** Merged History Encounter **  ?    ? Lives with husband ?Caffeine use: 1 cup tea per day  ? ?Social Determinants of Health  ? ?Financial Resource Strain: Low Risk   ? Difficulty of Paying Living Expenses: Not hard at all  ?Food Insecurity: No Food Insecurity  ? Worried About Charity fundraiser in the Last Year: Never true  ? Ran Out of Food in the Last Year: Never true  ?Transportation Needs: No Transportation Needs  ? Lack of Transportation (Medical): No  ? Lack of Transportation (Non-Medical): No  ?Physical Activity: Insufficiently Active  ? Days of Exercise per Week: 7 days  ? Minutes of Exercise per Session: 20 min  ?Stress: No Stress Concern Present  ? Feeling of Stress : Not at all  ?Social Connections: Unknown  ? Frequency of Communication with Friends and Family: More than three times a week  ? Frequency of Social Gatherings with Friends and Family: Once a week  ? Attends Religious Services: Patient refused  ? Active Member of Clubs or Organizations: Patient  refused  ? Attends Archivist Meetings: Patient refused  ? Marital Status: Widowed  ?Intimate Partner Violence: Not At Risk  ? Fear of Current or Ex-Partner: No  ? Emotionally Abused: No  ? Physi

## 2021-09-26 ENCOUNTER — Ambulatory Visit (INDEPENDENT_AMBULATORY_CARE_PROVIDER_SITE_OTHER): Payer: Medicare Other | Admitting: Internal Medicine

## 2021-09-26 ENCOUNTER — Encounter: Payer: Self-pay | Admitting: Internal Medicine

## 2021-09-26 DIAGNOSIS — E559 Vitamin D deficiency, unspecified: Secondary | ICD-10-CM | POA: Insufficient documentation

## 2021-09-26 DIAGNOSIS — M059 Rheumatoid arthritis with rheumatoid factor, unspecified: Secondary | ICD-10-CM

## 2021-09-26 DIAGNOSIS — M0579 Rheumatoid arthritis with rheumatoid factor of multiple sites without organ or systems involvement: Secondary | ICD-10-CM | POA: Insufficient documentation

## 2021-09-26 DIAGNOSIS — R110 Nausea: Secondary | ICD-10-CM | POA: Insufficient documentation

## 2021-09-26 DIAGNOSIS — N83209 Unspecified ovarian cyst, unspecified side: Secondary | ICD-10-CM | POA: Insufficient documentation

## 2021-09-26 DIAGNOSIS — N907 Vulvar cyst: Secondary | ICD-10-CM | POA: Insufficient documentation

## 2021-09-26 DIAGNOSIS — N952 Postmenopausal atrophic vaginitis: Secondary | ICD-10-CM | POA: Insufficient documentation

## 2021-09-26 DIAGNOSIS — R053 Chronic cough: Secondary | ICD-10-CM

## 2021-09-26 DIAGNOSIS — L739 Follicular disorder, unspecified: Secondary | ICD-10-CM | POA: Insufficient documentation

## 2021-09-26 DIAGNOSIS — R5383 Other fatigue: Secondary | ICD-10-CM | POA: Insufficient documentation

## 2021-09-26 DIAGNOSIS — M1991 Primary osteoarthritis, unspecified site: Secondary | ICD-10-CM | POA: Insufficient documentation

## 2021-09-26 MED ORDER — HYOSCYAMINE SULFATE 0.125 MG PO TABS: 0.1250 mg | ORAL_TABLET | ORAL | 3 refills | Status: DC | PRN

## 2021-09-26 MED ORDER — PREDNISONE 10 MG PO TABS
ORAL_TABLET | ORAL | 1 refills | Status: DC
Start: 2021-09-26 — End: 2021-11-07

## 2021-09-26 NOTE — Assessment & Plan Note (Signed)
Worse post-COVID Tramadol prn

## 2021-09-26 NOTE — Assessment & Plan Note (Addendum)
On steroids, MTX Stress steroids

## 2021-09-26 NOTE — Assessment & Plan Note (Signed)
Post-COVID Start Zofran prn Stress steroids

## 2021-09-26 NOTE — Progress Notes (Signed)
Subjective:  Patient ID: Monica [name], female    DOB: 07/20/1945  Age: 76 y.o. MRN: [id number]  CC: Follow-up   HPI Monica [name] presents for COVID x2 episodes C/o cough - colored mucus. Now on Doxy F/u on RA C/o nausea, fatigue, insomnia - worse C/o colitis sx's  Outpatient Medications Prior to Visit  Medication Sig Dispense Refill   acetaminophen (TYLENOL) 500 MG tablet Take 500 mg by mouth every 6 (six) hours as needed for moderate pain or headache.     calcium carbonate (TUMS EX) 750 MG chewable tablet Chew 1,500 mg by mouth at bedtime.     CALCIUM-MAGNESIUM-ZINC PO Take 1 tablet by mouth daily.     Cholecalciferol (VITAMIN D) 1000 UNITS capsule Take 1,000 Units by mouth daily.     dextromethorphan (DELSYM) 30 MG/5ML liquid Take 30 mg by mouth at bedtime as needed for cough.      ELIQUIS 5 MG TABS tablet TAKE 1 TABLET TWICE A DAY 180 tablet 3   Estradiol 10 MCG TABS Place 10 mcg vaginally 3 (three) times a week.     folic acid (FOLVITE) 1 MG tablet 1 tablet     Lactobacillus Rhamnosus, GG, (CULTURELLE PO) Take 1 tablet by mouth daily.     loratadine (CLARITIN) 10 MG tablet Take 10 mg by mouth daily as needed for allergies.     metoprolol succinate (TOPROL XL) 25 MG 24 hr tablet Take 1.5 tablets (37.5 mg total) by mouth in the morning and at bedtime. 180 tablet 0   Multiple Vitamin (MULTIVITAMIN WITH MINERALS) TABS tablet Take 1 tablet by mouth every evening. Centrum Silver     Respiratory Therapy Supplies (FLUTTER) DEVI Twice a day and prn as needed, may increase if feeling worse 1 each 0   denosumab (PROLIA) 60 MG/ML SOSY injection See admin instructions.     famotidine (PEPCID AC MAXIMUM STRENGTH) 20 MG tablet 2 tablets at bedtime as needed     famotidine (PEPCID) 40 MG tablet Take 1 tablet (40 mg total) by mouth 2 (two) times daily. (Patient taking differently: Take 40 mg by mouth daily.) 60 tablet 11   fluticasone (FLONASE ALLERGY RELIEF) 50 MCG/ACT nasal spray       guaiFENesin (MUCINEX) 600 MG 12 hr tablet Take 1 tablet every 12 hours by oral route.     loratadine (CLARITIN) 10 MG tablet 1 tablet     methotrexate (RHEUMATREX) 2.5 MG tablet Take 15 mg by mouth once a week.     metoprolol tartrate (LOPRESSOR) 25 MG tablet 1 tablet with food     Multiple Vitamins-Minerals (CENTRUM SILVER) tablet See admin instructions.     predniSONE (DELTASONE) 10 MG tablet Take 3 tablets (30 mg total) by mouth daily with breakfast. (Patient taking differently: Take 10 mg by mouth daily with breakfast.) 120 tablet 0   No facility-administered medications prior to visit.    ROS: Review of Systems  Constitutional:  Positive for fatigue and unexpected weight change. Negative for activity change, appetite change, chills, diaphoresis and fever.  HENT:  Negative for congestion, dental problem, ear pain, hearing loss, mouth sores, postnasal drip, sinus pressure, sneezing, sore throat and voice change.   Eyes:  Negative for pain and visual disturbance.  Respiratory:  Positive for cough. Negative for chest tightness, wheezing and stridor.   Cardiovascular:  Negative for chest pain, palpitations and leg swelling.  Gastrointestinal:  Positive for abdominal pain and nausea. Negative for abdominal distention, blood in stool, rectal  pain and vomiting.  Genitourinary:  Negative for decreased urine volume, difficulty urinating, dysuria, frequency, hematuria, menstrual problem, vaginal bleeding, vaginal discharge and vaginal pain.  Musculoskeletal:  Positive for arthralgias. Negative for back pain, gait problem, joint swelling and neck pain.  Skin:  Negative for color change, rash and wound.  Neurological:  Positive for weakness. Negative for dizziness, tremors, syncope, speech difficulty and light-headedness.  Hematological:  Negative for adenopathy.  Psychiatric/Behavioral:  Positive for sleep disturbance. Negative for behavioral problems, confusion, decreased concentration, dysphoric  mood, hallucinations and suicidal ideas. The patient is not nervous/anxious and is not hyperactive.    Objective:  BP 118/68 (BP Location: Left Arm, Patient Position: Sitting, Cuff Size: Normal)   Pulse 97   Temp 97.9 F (36.6 C)   Ht 5' 1.5" (1.562 m)   Wt 98 lb (44.5 kg)   SpO2 96%   BMI 18.22 kg/m   BP Readings from Last 3 Encounters:  09/26/21 118/68  09/13/21 (!) 158/78  08/10/21 (!) 141/83    Wt Readings from Last 3 Encounters:  09/26/21 98 lb (44.5 kg)  09/12/21 100 lb (45.4 kg)  06/27/21 100 lb 4 oz (45.5 kg)    Physical Exam Constitutional:      General: She is not in acute distress.    Appearance: Normal appearance. She is well-developed. She is not ill-appearing or toxic-appearing.  HENT:     Head: Normocephalic.     Right Ear: External ear normal.     Left Ear: External ear normal.     Nose: Nose normal.  Eyes:     General:        Right eye: No discharge.        Left eye: No discharge.     Conjunctiva/sclera: Conjunctivae normal.     Pupils: Pupils are equal, round, and reactive to light.  Neck:     Thyroid: No thyromegaly.     Vascular: No JVD.     Trachea: No tracheal deviation.  Cardiovascular:     Rate and Rhythm: Normal rate and regular rhythm.     Heart sounds: Normal heart sounds.  Pulmonary:     Effort: No respiratory distress.     Breath sounds: No stridor. No wheezing.  Abdominal:     General: Bowel sounds are normal. There is no distension.     Palpations: Abdomen is soft. There is no mass.     Tenderness: There is no abdominal tenderness. There is no guarding or rebound.  Musculoskeletal:        General: No tenderness.     Cervical back: Normal range of motion and neck supple. No rigidity.  Lymphadenopathy:     Cervical: No cervical adenopathy.  Skin:    Findings: No erythema or rash.  Neurological:     Cranial Nerves: No cranial nerve deficit.     Motor: No abnormal muscle tone.     Coordination: Coordination normal.     Deep  Tendon Reflexes: Reflexes normal.  Psychiatric:        Behavior: Behavior normal.        Thought Content: Thought content normal.        Judgment: Judgment normal.  Looks tired Lowe abd is NT, sensitive    A total time of 45 minutes was spent preparing to see the patient, reviewing tests, x-rays, operative reports and other medical records.  Also, obtaining history and performing comprehensive physical exam.  Additionally, counseling the patient regarding the above listed issues.  Finally, documenting clinical information in the health records, coordination of care, educating the patient re: stress steroids, post COVID sx's, cough control. It is a complex case.   Lab Results  Component Value Date   WBC 17.9 (H) 09/12/2021   HGB 12.2 09/12/2021   HCT 36.3 09/12/2021   PLT 453 (H) 09/12/2021   GLUCOSE 115 (H) 09/12/2021   CHOL 140 07/06/2018   TRIG 41.0 07/06/2018   HDL 62.80 07/06/2018   LDLCALC 69 07/06/2018   ALT 13 09/12/2021   AST 19 09/12/2021   NA 135 09/12/2021   K 3.8 09/12/2021   CL 100 09/12/2021   CREATININE 0.76 09/12/2021   BUN 17 09/12/2021   CO2 27 09/12/2021   TSH 3.09 05/30/2021    DG Chest Portable 1 View  Result Date: 09/12/2021 CLINICAL DATA:  Productive cough. Dizziness. History of bronchiectasis. EXAM: PORTABLE CHEST 1 VIEW COMPARISON:  Chest radiograph 08/10/2021.  CT 02/12/2021 FINDINGS: Heart is normal in size. Stable mediastinal contours. Stable ill-defined opacity in the right mid lung and both lower lung zones corresponding to bronchiectasis and sequela of indolent infection on prior CT. No superimposed acute airspace disease. There is stable biapical pleuroparenchymal scarring. No pleural effusion. No pneumothorax. No pulmonary edema. IMPRESSION: Stable chronic lung opacities corresponding to bronchiectasis and sequela of indolent infection on prior CT. No superimposed acute process. Electronically Signed   By: Keith Rake M.D.   On: 09/12/2021  20:38    Assessment & Plan:   Problem List Items Addressed This Visit     Cough    Worse post-COVID Tramadol prn       Rheumatoid arthritis (HCC)    On steroids, MTX Stress steroids       Relevant Medications   methotrexate (RHEUMATREX) 2.5 MG tablet   predniSONE (DELTASONE) 10 MG tablet   Nausea    Post-COVID Start Zofran prn Stress steroids          Meds ordered this encounter  Medications   hyoscyamine (LEVSIN) 0.125 MG tablet    Sig: Take 1-2 tablets (0.125-0.25 mg total) by mouth every 4 (four) hours as needed for up to 10 days.    Dispense:  100 tablet    Refill:  3   predniSONE (DELTASONE) 10 MG tablet    Sig: Prednisone 10 mg: take 4 tabs a day x 3 days; then 3 tabs a day x 4 days; then 2 tabs a day x 4 days, then go back to 7.5 mg/day. Take pc.    Dispense:  38 tablet    Refill:  1      Follow-up: Return in about 6 weeks (around 11/07/2021) for a follow-up visit.  Walker Kehr, MD

## 2021-09-27 ENCOUNTER — Encounter: Payer: Self-pay | Admitting: Internal Medicine

## 2021-10-09 DIAGNOSIS — H2513 Age-related nuclear cataract, bilateral: Secondary | ICD-10-CM | POA: Diagnosis not present

## 2021-10-09 DIAGNOSIS — H5213 Myopia, bilateral: Secondary | ICD-10-CM | POA: Diagnosis not present

## 2021-10-22 DIAGNOSIS — Z681 Body mass index (BMI) 19 or less, adult: Secondary | ICD-10-CM | POA: Diagnosis not present

## 2021-10-22 DIAGNOSIS — M0579 Rheumatoid arthritis with rheumatoid factor of multiple sites without organ or systems involvement: Secondary | ICD-10-CM | POA: Diagnosis not present

## 2021-10-22 DIAGNOSIS — M79641 Pain in right hand: Secondary | ICD-10-CM | POA: Diagnosis not present

## 2021-10-22 DIAGNOSIS — M1991 Primary osteoarthritis, unspecified site: Secondary | ICD-10-CM | POA: Diagnosis not present

## 2021-10-22 DIAGNOSIS — M79642 Pain in left hand: Secondary | ICD-10-CM | POA: Diagnosis not present

## 2021-10-22 DIAGNOSIS — L659 Nonscarring hair loss, unspecified: Secondary | ICD-10-CM | POA: Diagnosis not present

## 2021-11-05 DIAGNOSIS — Z9622 Myringotomy tube(s) status: Secondary | ICD-10-CM | POA: Diagnosis not present

## 2021-11-05 DIAGNOSIS — H6121 Impacted cerumen, right ear: Secondary | ICD-10-CM | POA: Diagnosis not present

## 2021-11-07 ENCOUNTER — Ambulatory Visit (INDEPENDENT_AMBULATORY_CARE_PROVIDER_SITE_OTHER): Payer: Medicare Other | Admitting: Internal Medicine

## 2021-11-07 ENCOUNTER — Encounter: Payer: Self-pay | Admitting: Internal Medicine

## 2021-11-07 DIAGNOSIS — R5382 Chronic fatigue, unspecified: Secondary | ICD-10-CM

## 2021-11-07 DIAGNOSIS — I480 Paroxysmal atrial fibrillation: Secondary | ICD-10-CM | POA: Diagnosis not present

## 2021-11-07 DIAGNOSIS — M059 Rheumatoid arthritis with rheumatoid factor, unspecified: Secondary | ICD-10-CM | POA: Diagnosis not present

## 2021-11-07 MED ORDER — METOPROLOL TARTRATE 25 MG PO TABS: 12.5000 mg | ORAL_TABLET | Freq: Every day | ORAL | 3 refills | Status: DC | PRN

## 2021-11-07 MED ORDER — METOPROLOL SUCCINATE ER 25 MG PO TB24: 25.0000 mg | ORAL_TABLET | Freq: Two times a day (BID) | ORAL | 0 refills | Status: DC

## 2021-11-07 NOTE — Assessment & Plan Note (Signed)
Cont on Eliquis- 

## 2021-11-07 NOTE — Assessment & Plan Note (Signed)
On MTX, folic acid, prednisone 2.5 mg/d

## 2021-11-07 NOTE — Progress Notes (Signed)
Subjective:  Patient ID: Monica [name], female    DOB: Aug 08, 1945  Age: 76 y.o. MRN: [id number]  CC: No chief complaint on file.   HPI Monica [name] presents for RA and post-COVID. C/o fatigue, wt loss.   Outpatient Medications Prior to Visit  Medication Sig Dispense Refill   acetaminophen (TYLENOL) 500 MG tablet Take 500 mg by mouth every 6 (six) hours as needed for moderate pain or headache.     calcium carbonate (TUMS EX) 750 MG chewable tablet Chew 1,500 mg by mouth at bedtime.     CALCIUM-MAGNESIUM-ZINC PO Take 1 tablet by mouth daily.     Cholecalciferol (VITAMIN D) 1000 UNITS capsule Take 1,000 Units by mouth daily.     dextromethorphan (DELSYM) 30 MG/5ML liquid Take 30 mg by mouth at bedtime as needed for cough.      ELIQUIS 5 MG TABS tablet TAKE 1 TABLET TWICE A DAY 180 tablet 3   Estradiol 10 MCG TABS Place 10 mcg vaginally 3 (three) times a week.     fluticasone (FLONASE ALLERGY RELIEF) 50 MCG/ACT nasal spray      folic acid (FOLVITE) 1 MG tablet 1 tablet     guaiFENesin (MUCINEX) 600 MG 12 hr tablet Take 1 tablet every 12 hours by oral route.     Lactobacillus Rhamnosus, GG, (CULTURELLE PO) Take 1 tablet by mouth daily.     methotrexate (RHEUMATREX) 2.5 MG tablet Take 15 mg by mouth once a week.     Multiple Vitamins-Minerals (CENTRUM SILVER) tablet See admin instructions.     Respiratory Therapy Supplies (FLUTTER) DEVI Twice a day and prn as needed, may increase if feeling worse 1 each 0   metoprolol succinate (TOPROL XL) 25 MG 24 hr tablet Take 1.5 tablets (37.5 mg total) by mouth in the morning and at bedtime. 180 tablet 0   metoprolol tartrate (LOPRESSOR) 25 MG tablet 1 tablet with food     predniSONE (DELTASONE) 10 MG tablet Prednisone 10 mg: take 4 tabs a day x 3 days; then 3 tabs a day x 4 days; then 2 tabs a day x 4 days, then go back to 7.5 mg/day. Take pc. 38 tablet 1   denosumab (PROLIA) 60 MG/ML SOSY injection See admin instructions.     famotidine (PEPCID  AC MAXIMUM STRENGTH) 20 MG tablet 2 tablets at bedtime as needed     famotidine (PEPCID) 40 MG tablet Take 1 tablet (40 mg total) by mouth 2 (two) times daily. (Patient taking differently: Take 40 mg by mouth daily.) 60 tablet 11   hyoscyamine (LEVSIN) 0.125 MG tablet Take 1-2 tablets (0.125-0.25 mg total) by mouth every 4 (four) hours as needed for up to 10 days. 100 tablet 3   loratadine (CLARITIN) 10 MG tablet Take 10 mg by mouth daily as needed for allergies.     loratadine (CLARITIN) 10 MG tablet 1 tablet     Multiple Vitamin (MULTIVITAMIN WITH MINERALS) TABS tablet Take 1 tablet by mouth every evening. Centrum Silver     No facility-administered medications prior to visit.    ROS: Review of Systems  Constitutional:  Positive for fatigue. Negative for activity change, appetite change, chills and unexpected weight change.  HENT:  Negative for congestion, mouth sores and sinus pressure.   Eyes:  Negative for visual disturbance.  Respiratory:  Negative for cough and chest tightness.   Gastrointestinal:  Negative for abdominal pain and nausea.  Genitourinary:  Negative for difficulty urinating, frequency and vaginal  pain.  Musculoskeletal:  Negative for back pain and gait problem.  Skin:  Negative for pallor and rash.  Neurological:  Negative for dizziness, tremors, weakness, numbness and headaches.  Psychiatric/Behavioral:  Positive for dysphoric mood and sleep disturbance. Negative for confusion and suicidal ideas. The patient is nervous/anxious.     Objective:  BP 110/70 (BP Location: Left Arm, Patient Position: Sitting, Cuff Size: Normal)   Pulse 96   Temp 97.8 F (36.6 C) (Oral)   Ht 5' 1.5" (1.562 m)   Wt 100 lb (45.4 kg)   SpO2 97%   BMI 18.59 kg/m   BP Readings from Last 3 Encounters:  11/07/21 110/70  09/26/21 118/68  09/13/21 (!) 158/78    Wt Readings from Last 3 Encounters:  11/07/21 100 lb (45.4 kg)  09/26/21 98 lb (44.5 kg)  09/12/21 100 lb (45.4 kg)     Physical Exam Constitutional:      General: She is not in acute distress.    Appearance: Normal appearance. She is well-developed.  HENT:     Head: Normocephalic.     Right Ear: External ear normal.     Left Ear: External ear normal.     Nose: Nose normal.  Eyes:     General:        Right eye: No discharge.        Left eye: No discharge.     Conjunctiva/sclera: Conjunctivae normal.     Pupils: Pupils are equal, round, and reactive to light.  Neck:     Thyroid: No thyromegaly.     Vascular: No JVD.     Trachea: No tracheal deviation.  Cardiovascular:     Rate and Rhythm: Normal rate and regular rhythm.     Heart sounds: Normal heart sounds.  Pulmonary:     Effort: No respiratory distress.     Breath sounds: No stridor. No wheezing.  Abdominal:     General: Bowel sounds are normal. There is no distension.     Palpations: Abdomen is soft. There is no mass.     Tenderness: There is no abdominal tenderness. There is no guarding or rebound.  Musculoskeletal:        General: No tenderness.     Cervical back: Normal range of motion and neck supple. No rigidity.  Lymphadenopathy:     Cervical: No cervical adenopathy.  Skin:    Findings: No erythema or rash.  Neurological:     Mental Status: She is oriented to person, place, and time.     Cranial Nerves: No cranial nerve deficit.     Motor: No weakness or abnormal muscle tone.     Coordination: Coordination normal.     Gait: Gait normal.     Deep Tendon Reflexes: Reflexes normal.  Psychiatric:        Behavior: Behavior normal.        Thought Content: Thought content normal.        Judgment: Judgment normal.     Lab Results  Component Value Date   WBC 17.9 (H) 09/12/2021   HGB 12.2 09/12/2021   HCT 36.3 09/12/2021   PLT 453 (H) 09/12/2021   GLUCOSE 115 (H) 09/12/2021   CHOL 140 07/06/2018   TRIG 41.0 07/06/2018   HDL 62.80 07/06/2018   LDLCALC 69 07/06/2018   ALT 13 09/12/2021   AST 19 09/12/2021   NA 135  09/12/2021   K 3.8 09/12/2021   CL 100 09/12/2021   CREATININE 0.76 09/12/2021  BUN 17 09/12/2021   CO2 27 09/12/2021   TSH 3.09 05/30/2021    DG Chest Portable 1 View  Result Date: 09/12/2021 CLINICAL DATA:  Productive cough. Dizziness. History of bronchiectasis. EXAM: PORTABLE CHEST 1 VIEW COMPARISON:  Chest radiograph 08/10/2021.  CT 02/12/2021 FINDINGS: Heart is normal in size. Stable mediastinal contours. Stable ill-defined opacity in the right mid lung and both lower lung zones corresponding to bronchiectasis and sequela of indolent infection on prior CT. No superimposed acute airspace disease. There is stable biapical pleuroparenchymal scarring. No pleural effusion. No pneumothorax. No pulmonary edema. IMPRESSION: Stable chronic lung opacities corresponding to bronchiectasis and sequela of indolent infection on prior CT. No superimposed acute process. Electronically Signed   By: Keith Rake M.D.   On: 09/12/2021 20:38    Assessment & Plan:   Problem List Items Addressed This Visit     Fatigue    post-COVID and other issues/meds      Paroxysmal atrial fibrillation (HCC)    Cont on Eliquis      Relevant Medications   metoprolol succinate (TOPROL XL) 25 MG 24 hr tablet   metoprolol tartrate (LOPRESSOR) 25 MG tablet   Rheumatoid arthritis (HCC)    On MTX, folic acid, prednisone 2.5 mg/d         Meds ordered this encounter  Medications   metoprolol succinate (TOPROL XL) 25 MG 24 hr tablet    Sig: Take 1 tablet (25 mg total) by mouth in the morning and at bedtime.    Dispense:  180 tablet    Refill:  0    5/13 - Dose change   metoprolol tartrate (LOPRESSOR) 25 MG tablet    Sig: Take 0.5 tablets (12.5 mg total) by mouth daily as needed.    Dispense:  90 tablet    Refill:  3      Follow-up: Return in about 3 months (around 02/07/2022) for a follow-up visit.  Walker Kehr, MD

## 2021-11-07 NOTE — Assessment & Plan Note (Signed)
post-COVID and other issues/meds

## 2021-11-15 DIAGNOSIS — Z01411 Encounter for gynecological examination (general) (routine) with abnormal findings: Secondary | ICD-10-CM | POA: Diagnosis not present

## 2021-11-15 DIAGNOSIS — Z124 Encounter for screening for malignant neoplasm of cervix: Secondary | ICD-10-CM | POA: Diagnosis not present

## 2021-11-15 DIAGNOSIS — M810 Age-related osteoporosis without current pathological fracture: Secondary | ICD-10-CM | POA: Diagnosis not present

## 2021-11-15 DIAGNOSIS — Z01419 Encounter for gynecological examination (general) (routine) without abnormal findings: Secondary | ICD-10-CM | POA: Diagnosis not present

## 2021-11-15 DIAGNOSIS — Z681 Body mass index (BMI) 19 or less, adult: Secondary | ICD-10-CM | POA: Diagnosis not present

## 2021-11-15 DIAGNOSIS — Z0142 Encounter for cervical smear to confirm findings of recent normal smear following initial abnormal smear: Secondary | ICD-10-CM | POA: Diagnosis not present

## 2021-11-21 DIAGNOSIS — M810 Age-related osteoporosis without current pathological fracture: Secondary | ICD-10-CM | POA: Diagnosis not present

## 2021-11-22 ENCOUNTER — Other Ambulatory Visit: Payer: Self-pay | Admitting: Obstetrics & Gynecology

## 2021-12-05 ENCOUNTER — Inpatient Hospital Stay (HOSPITAL_COMMUNITY): Admission: RE | Admit: 2021-12-05 | Payer: TRICARE For Life (TFL) | Source: Ambulatory Visit

## 2021-12-13 ENCOUNTER — Encounter: Payer: Self-pay | Admitting: Cardiovascular Disease

## 2021-12-13 ENCOUNTER — Encounter: Payer: Self-pay | Admitting: Emergency Medicine

## 2021-12-13 ENCOUNTER — Ambulatory Visit (INDEPENDENT_AMBULATORY_CARE_PROVIDER_SITE_OTHER): Payer: Medicare Other | Admitting: Emergency Medicine

## 2021-12-13 ENCOUNTER — Telehealth: Payer: Self-pay | Admitting: Cardiovascular Disease

## 2021-12-13 VITALS — BP 124/72 | HR 96 | Temp 98.1°F | Ht 65.0 in | Wt 100.6 lb

## 2021-12-13 DIAGNOSIS — J301 Allergic rhinitis due to pollen: Secondary | ICD-10-CM

## 2021-12-13 DIAGNOSIS — J479 Bronchiectasis, uncomplicated: Secondary | ICD-10-CM | POA: Diagnosis not present

## 2021-12-13 DIAGNOSIS — K219 Gastro-esophageal reflux disease without esophagitis: Secondary | ICD-10-CM

## 2021-12-13 MED ORDER — METOPROLOL SUCCINATE ER 25 MG PO TB24: 37.5000 mg | ORAL_TABLET | Freq: Two times a day (BID) | ORAL | 0 refills | Status: DC

## 2021-12-13 NOTE — Patient Instructions (Addendum)
We will repeat your CT scan of the chest without contrast in October 2023. Please continue your Pepcid as you have been using. Agree with restarting your fluticasone nasal spray this fall and using it during the allergy season. Continue your Delsym for cough suppression as needed Follow-up with Dr. Trudie Reed as planned Follow Dr. Lamonte Sakai in October after your CT scan so we can review the results together

## 2021-12-13 NOTE — Telephone Encounter (Signed)
LMTCB

## 2021-12-13 NOTE — Telephone Encounter (Signed)
Patient is returning Sarah's call. Please advise.

## 2021-12-13 NOTE — Progress Notes (Signed)
Subjective:    Patient ID: Monica [name], female    DOB: June 07, 1945, 76 y.o.   MRN: [id number]  HPI  ROV 12/13/2021 --76 year old woman with a history of bronchiectasis, upper airway irritation syndrome and associated chronic cough.  No evidence of obstructive disease on her methacholine challenge in the past.  She is colonized with Pseudomonas, has been negative for mycobacterial disease in the past.  Unfortunately she reports today that she has had COVID x2 Most recent chest imaging was a CT scan 02/12/2021 COVID in March - some diarrhea with molnupiravir. Then COVID again in May She has also been dx with RA, has received pred and now on MTX since mid March.  She has had increased cough since COVID in May. She has some GERD, does lead to cough, she is on famotidine. Cough is minimally productive but is pale yellow.  Not using flonase currently but will start in fall.                                                                                                                                                                                                              Review of Systems As per history of present illness     Objective:   Physical Exam Vitals:   12/13/21 1130  BP: 124/72  Pulse: 96  Temp: 98.1 F (36.7 C)  TempSrc: Oral  SpO2: 99%  Weight: 100 lb 9.6 oz (45.6 kg)  Height: '5\' 5"'$  (1.651 m)   'Gen: Pleasant, thin woman, well-appearing, in no distress,  normal affect  ENT: No lesions,  mouth clear,  oropharynx clear, no postnasal drip  Neck: No JVD, no stridor  Lungs: No use of accessory muscles, clear without rales or rhonchi, no wheeze   Cardiovascular: RRR, heart sounds normal, no murmur or gallops, no peripheral edema  Musculoskeletal: No deformities, no cyanosis or clubbing  Neuro: alert, non focal  Skin: Warm, no lesions or rashes        Assessment & Plan:  Bronchiectasis without acute exacerbation (HCC) Overall doing well.  She is not on a  mucus clearance regimen, has not required.  She is also not on any scheduled bronchodilator therapy.  We are trying to control rhinitis, GERD to help minimize upper airway cough.  Which she will have a repeat CT scan of the chest in October to evaluate the bronchiectasis, evaluate for any evidence of opportunistic infection given her methotrexate use, recent prednisone use.  Seasonal allergic rhinitis Plan to restart her fluticasone nasal spray this  fall  GERD On famotidine with some breakthrough reflux and cough.  Avoiding PPI given her history of osteoporosis  Baltazar Apo, MD, PhD 12/13/2021, 11:55 AM [facility] [phone] or if no answer [phone]

## 2021-12-13 NOTE — Telephone Encounter (Signed)
Returned the call to the patient concerning her MyChart message. She was calling to inform Dr. Sallyanne Kuster that since the increase in her Methotrexate dosage she has been having increased PAC's. According to her Jodelle Red, it is not afib. She is asymptomatic except for palpitations.   She is currently taking her Metoprolol Succinate 25 mg bid and then has Tartrate 12.5 mg as needed. She has been taking the 12.5 mg at night as needed and this has been helping with the PACs.  She would like to have Dr. Victorino December recommendations. She has been advised to keep on the Succinate as prescribed and take the 12. 5 mg tartrate as needed to help with the PAC's.  She would like her reply back to be in MyChart.

## 2021-12-13 NOTE — Assessment & Plan Note (Signed)
On famotidine with some breakthrough reflux and cough.  Avoiding PPI given her history of osteoporosis

## 2021-12-13 NOTE — Assessment & Plan Note (Signed)
Overall doing well.  She is not on a mucus clearance regimen, has not required.  She is also not on any scheduled bronchodilator therapy.  We are trying to control rhinitis, GERD to help minimize upper airway cough.  Which she will have a repeat CT scan of the chest in October to evaluate the bronchiectasis, evaluate for any evidence of opportunistic infection given her methotrexate use, recent prednisone use.

## 2021-12-13 NOTE — Assessment & Plan Note (Signed)
Plan to restart her fluticasone nasal spray this fall

## 2021-12-19 ENCOUNTER — Ambulatory Visit (HOSPITAL_COMMUNITY)
Admission: RE | Admit: 2021-12-19 | Discharge: 2021-12-19 | Disposition: A | Discharge status: Home / Self Care | Payer: Medicare Other | Source: Ambulatory Visit | Attending: Obstetrics & Gynecology | Admitting: Obstetrics & Gynecology

## 2021-12-19 DIAGNOSIS — M810 Age-related osteoporosis without current pathological fracture: Secondary | ICD-10-CM | POA: Diagnosis not present

## 2021-12-19 MED ORDER — ZOLEDRONIC ACID 5 MG/100ML IV SOLN
INTRAVENOUS | Status: AC
  Filled 2021-12-19: qty 100

## 2021-12-19 MED ORDER — ZOLEDRONIC ACID 5 MG/100ML IV SOLN
5.0000 mg | Freq: Once | INTRAVENOUS | Status: AC
  Administered 2021-12-19: 5 mg via INTRAVENOUS

## 2021-12-27 DIAGNOSIS — Z79899 Other long term (current) drug therapy: Secondary | ICD-10-CM | POA: Diagnosis not present

## 2021-12-27 DIAGNOSIS — M0579 Rheumatoid arthritis with rheumatoid factor of multiple sites without organ or systems involvement: Secondary | ICD-10-CM | POA: Diagnosis not present

## 2022-01-01 ENCOUNTER — Encounter: Payer: Self-pay | Admitting: Physician Assistant

## 2022-01-01 ENCOUNTER — Ambulatory Visit: Payer: Medicare Other | Attending: Physician Assistant | Admitting: Physician Assistant

## 2022-01-01 VITALS — BP 118/74 | HR 94 | Ht 61.5 in | Wt 100.6 lb

## 2022-01-01 DIAGNOSIS — M059 Rheumatoid arthritis with rheumatoid factor, unspecified: Secondary | ICD-10-CM | POA: Insufficient documentation

## 2022-01-01 DIAGNOSIS — R002 Palpitations: Secondary | ICD-10-CM | POA: Diagnosis not present

## 2022-01-01 NOTE — Progress Notes (Unsigned)
Cardiology Office Note:    Date:  01/03/2022   ID:  Monica [name], DOB 1945/08/30, MRN [id number]  PCP:  Cassandria Anger, MD   [facility] Cardiologist:  Sanda Klein, MD Electrophysiologist:  Vickie Epley, MD     Referring MD: Cassandria Anger, MD   Chief Complaint  Patient presents with   Follow-up    Seen for Dr. Sallyanne Kuster    History of Present Illness:    Monica [name] is a 76 y.o. female with a hx of palpitations related to eating frequent episode of PAF/PAC/PVCs, bronchiectasis, GERD and history of rheumatoid arthritis.  Patient was last seen by Dr. Sallyanne Kuster in November 2022 at which time she was having increased palpitation.  She was last seen by Dr. Quentin Ore of EP service on 06/20/2021 to discuss possible ablation.  Prior to that, she mention she had another episode of A-fib that lasted 6 hours in January.  She was advised to take a dose of metoprolol which did not provide relief, she later presented to the ED and self converted to sinus rhythm while in the emergency room.  It was recommended she take a more scheduled beta-blocker at 25 mg twice a day.  Dr. Quentin Ore did recommend catheter ablation procedure, however she was getting steroid treatment for rheumatoid arthritis at the time.  It was recommended to reconsider catheter ablation was she is able to come off of steroid.  Metoprolol tartrate was changed to metoprolol succinate 25 mg twice a day for better rate control.  Since the last visit, patient was diagnosed with COVID twice in March and also in May.  Metoprolol succinate was increased to 37.5 mg twice a day due to increased palpitation and PACs.  Patient presents today for follow-up.  Ever since she is increased her metoprolol succinate to 37.5 mg twice a day, her palpitation has improved.  She still has PACs/PVCs and occasional A-fib.  She had atrial fibrillation on the 23rd and the 17th of this month, both episode lasted roughly 1  hour before self terminating.  She has short acting metoprolol heart rate on the side she can take for breakthrough atrial fibrillation.  Unfortunately her rheumatoid arthritis is currently not under good control.  She also has chronic significant fatigue.  Her fatigue actually did not worsen when she increase the metoprolol succinate, therefore I suspect her fatigue is not related to the beta-blocker.  We discussed the possibility of either continue the current strategy or potentially increase the metoprolol succinate to 50 mg twice a day, she opted to continue on the current strategy for now.  And if her palpitation does increase, she has the option of increasing the metoprolol succinate to 50 mg twice a day.  Once her rheumatoid arthritis is under better control, she will be a good candidate to go through with ablation procedure.  She can follow-up with Dr. Sallyanne Kuster as previously scheduled in November.  Past Medical History:  Diagnosis Date   Adenomatous colon polyp 10/2003   Allergic rhinitis    Allergy    SEASONAL   Bronchiectasis    Chronic sinusitis    Diverticulosis    GERD (gastroesophageal reflux disease)    Hearing loss    Hemorrhoids    Insomnia    Menopausal disorder    Osteoporosis    Palpitations    Paroxysmal A-fib (HCC)    Rheumatoid arthritis (HCC)    TMJ syndrome    Urticaria  Past Surgical History:  Procedure Laterality Date   COLONOSCOPY     NASAL SINUS SURGERY     TYMPANOSTOMY     VIDEO BRONCHOSCOPY Bilateral 07/16/2016   Procedure: VIDEO BRONCHOSCOPY WITH FLUORO;  Surgeon: Collene Gobble, MD;  Location: [facility];  Service: Cardiopulmonary;  Laterality: Bilateral;    Current Medications: Current Meds  Medication Sig   acetaminophen (TYLENOL) 500 MG tablet Take 500 mg by mouth every 6 (six) hours as needed for moderate pain or headache.   calcium carbonate (TUMS EX) 750 MG chewable tablet Chew 1,500 mg by mouth at bedtime.   CALCIUM-MAGNESIUM-ZINC PO  Take 1 tablet by mouth daily.   Cholecalciferol (VITAMIN D) 1000 UNITS capsule Take 1,000 Units by mouth daily.   dextromethorphan (DELSYM) 30 MG/5ML liquid Take 30 mg by mouth at bedtime as needed for cough.    ELIQUIS 5 MG TABS tablet TAKE 1 TABLET TWICE A DAY   Estradiol 10 MCG TABS Place 10 mcg vaginally 3 (three) times a week.   famotidine (PEPCID AC MAXIMUM STRENGTH) 20 MG tablet 2 tablets at bedtime as needed Orally Once a day   fluticasone (FLONASE ALLERGY RELIEF) 50 MCG/ACT nasal spray    folic acid (FOLVITE) 1 MG tablet 2 mg.   guaiFENesin (MUCINEX) 600 MG 12 hr tablet Take 1 tablet every 12 hours by oral route.   Lactobacillus Rhamnosus, GG, (CULTURELLE PO) Take 1 tablet by mouth daily.   methotrexate (RHEUMATREX) 2.5 MG tablet Take 20 mg by mouth once a week.   metoprolol succinate (TOPROL XL) 25 MG 24 hr tablet Take 1.5 tablets (37.5 mg total) by mouth in the morning and at bedtime.   metoprolol tartrate (LOPRESSOR) 25 MG tablet Take 0.5 tablets (12.5 mg total) by mouth daily as needed.   Multiple Vitamins-Minerals (CENTRUM SILVER) tablet See admin instructions.   Respiratory Therapy Supplies (FLUTTER) DEVI Twice a day and prn as needed, may increase if feeling worse   traMADol (ULTRAM) 50 MG tablet Take 1 tablet by mouth every 8 (eight) hours as needed.     Allergies:   Amoxicillin, Moxifloxacin, Nitrofurantoin, Albuterol, Amoxicillin-pot clavulanate, Avelox [moxifloxacin hcl in nacl], Benzonatate, Ciprofloxacin, Macrobid [nitrofurantoin macrocrystal], and Sulfamethoxazole-trimethoprim   Social History   Socioeconomic History   Marital status: Widowed    Spouse name: Monica [name]   Number of children: 0   Years of education: BSN   Highest education level: Not on file  Occupational History   Occupation: retired Therapist, sports  Tobacco Use   Smoking status: Never   Smokeless tobacco: Never  Vaping Use   Vaping Use: Never used  Substance and Sexual Activity   Alcohol use: Yes     Alcohol/week: 1.0 - 2.0 standard drink of alcohol    Types: 1 - 2 Glasses of wine per week    Comment: 1-2 glasses of wine a month 05/29/21   Drug use: No   Sexual activity: Not Currently  Other Topics Concern   Not on file  Social History Narrative   ** Merged History Encounter **       Lives with husband Caffeine use: 1 cup tea per day   Social Determinants of Health   Financial Resource Strain: Low Risk  (07/17/2021)   Overall Financial Resource Strain (CARDIA)    Difficulty of Paying Living Expenses: Not hard at all  Food Insecurity: No Food Insecurity (07/17/2021)   Hunger Vital Sign    Worried About Running Out of Food in the Last Year: Never true  Ran Out of Food in the Last Year: Never true  Transportation Needs: No Transportation Needs (07/17/2021)   PRAPARE - Hydrologist (Medical): No    Lack of Transportation (Non-Medical): No  Physical Activity: Insufficiently Active (07/17/2021)   Exercise Vital Sign    Days of Exercise per Week: 7 days    Minutes of Exercise per Session: 20 min  Stress: No Stress Concern Present (07/17/2021)   [place]    Feeling of Stress : Not at all  Social Connections: Unknown (07/17/2021)   Social Connection and Isolation Panel [NHANES]    Frequency of Communication with Friends and Family: More than three times a week    Frequency of Social Gatherings with Friends and Family: Once a week    Attends Religious Services: Patient refused    Active Member of Clubs or Organizations: Patient refused    Attends Archivist Meetings: Patient refused    Marital Status: Widowed     Family History: The patient's family history includes AVM in her brother; Cancer in her maternal grandmother, mother, and paternal uncle; Colon cancer (age of onset: 96) in her mother; Heart disease in her father and maternal grandfather; Hypertension in her father;  Rectal cancer in her mother; Seizures in her mother. There is no history of Esophageal cancer.  ROS:   Please see the history of present illness.     All other systems reviewed and are negative.  EKGs/Labs/Other Studies Reviewed:    The following studies were reviewed today:   Echo 10/06/2015 LV EF: 55% -   60%   -------------------------------------------------------------------  Indications:      Atrial fibrillation (I48.0).   -------------------------------------------------------------------  History:   PMH:   Atrial fibrillation.  Risk factors:  Palpitation.     -------------------------------------------------------------------  Study Conclusions   - Left ventricle: The cavity size was normal. Systolic function was    normal. The estimated ejection fraction was in the range of 55%    to 60%. Wall motion was normal; there were no regional wall    motion abnormalities. Doppler parameters are consistent with    abnormal left ventricular relaxation (grade 1 diastolic    dysfunction). There was no evidence of elevated ventricular    filling pressure by Doppler parameters.  - Aortic valve: Trileaflet; normal thickness leaflets. There was no    regurgitation.  - Aortic root: The aortic root was normal in size.  - Mitral valve: There was no regurgitation.  - Left atrium: The atrium was normal in size.  - Right ventricle: Systolic function was normal.  - Right atrium: The atrium was normal in size.  - Tricuspid valve: There was trivial regurgitation.  - Pulmonic valve: There was trivial regurgitation.  - Pulmonary arteries: Systolic pressure was within the normal    range.  - Inferior vena cava: The vessel was normal in size.  - Pericardium, extracardiac: There was no pericardial effusion.    EKG:  EKG is not ordered today.    Recent Labs: 05/24/2021: Magnesium 2.4 05/30/2021: TSH 3.09 09/12/2021: ALT 13; BUN 17; Creatinine, Ser 0.76; Hemoglobin 12.2; Platelets 453;  Potassium 3.8; Sodium 135  Recent Lipid Panel    Component Value Date/Time   CHOL 140 07/06/2018 0755   TRIG 41.0 07/06/2018 0755   HDL 62.80 07/06/2018 0755   CHOLHDL 2 07/06/2018 0755   VLDL 8.2 07/06/2018 0755   LDLCALC 69 07/06/2018 0755  Risk Assessment/Calculations:           Physical Exam:    VS:  BP 118/74   Pulse 94   Ht 5' 1.5" (1.562 m)   Wt 100 lb 9.6 oz (45.6 kg)   SpO2 97%   BMI 18.70 kg/m        Wt Readings from Last 3 Encounters:  01/01/22 100 lb 9.6 oz (45.6 kg)  12/13/21 100 lb 9.6 oz (45.6 kg)  11/07/21 100 lb (45.4 kg)     GEN:  Well nourished, well developed in no acute distress HEENT: Normal NECK: No JVD; No carotid bruits LYMPHATICS: No lymphadenopathy CARDIAC: RRR, no murmurs, rubs, gallops RESPIRATORY:  Clear to auscultation without rales, wheezing or rhonchi  ABDOMEN: Soft, non-tender, non-distended MUSCULOSKELETAL:  No edema; No deformity  SKIN: Warm and dry NEUROLOGIC:  Alert and oriented x 3 PSYCHIATRIC:  Normal affect   ASSESSMENT:    1. Palpitations   2. Rheumatoid arthritis with positive rheumatoid factor, involving unspecified site [facility])    PLAN:    In order of problems listed above:  Palpitation: She has a history of PAF, PAC and PVCs.  She is currently on Eliquis.  She recently increased metoprolol to 37.5 mg twice a day.  However she continued to have palpitation.  Since the last visit, she had a 2 episode of palpitation that lasted roughly 1 hour each.  We discussed possibly continue on the current therapy or increase the metoprolol further to 50 mg twice a day, he wished to continue on the current therapy for now.  She is aware that if she does have increased palpitation, she always to have the option of taking a higher dose of metoprolol  Rheumatoid arthritis: Patient was previously referred to Dr. Quentin Ore of EP service to discuss possible ablation, she was seen by Dr. Quentin Ore in February.  She was felt to be a good  candidate for ablation, however will need to have her rheumatoid arthritis under good control before proceeding.           Medication Adjustments/Labs and Tests Ordered: Current medicines are reviewed at length with the patient today.  Concerns regarding medicines are outlined above.  No orders of the defined types were placed in this encounter.  No orders of the defined types were placed in this encounter.   Patient Instructions  Medication Instructions:  Your physician recommends that you continue on your current medications as directed. Please refer to the Current Medication list given to you today.   *If you need a refill on your cardiac medications before your next appointment, please call your pharmacy*   Lab Work: NONE ordered at this time of appointment   If you have labs (blood work) drawn today and your tests are completely normal, you will receive your results only by: [place] (if you have MyChart) OR A paper copy in the mail If you have any lab test that is abnormal or we need to change your treatment, we will call you to review the results.   Testing/Procedures: NONE ordered at this time of appointment     Follow-Up: At [facility], you and your health needs are our priority.  As part of our continuing mission to provide you with exceptional heart care, we have created designated Provider Care Teams.  These Care Teams include your primary Cardiologist (physician) and Advanced Practice Providers (APPs -  Physician Assistants and Nurse Practitioners) who all work together to provide you with the care  you need, when you need it.  We recommend signing up for the patient portal called "MyChart".  Sign up information is provided on this After Visit Summary.  MyChart is used to connect with patients for Virtual Visits (Telemedicine).  Patients are able to view lab/test results, encounter notes, upcoming appointments, etc.  Non-urgent messages can be sent  to your provider as well.   To learn more about what you can do with MyChart, go to [email or web address].    Your next appointment:    Keep upcoming appointment  The format for your next appointment:   In Person  Provider:   Sanda Klein, MD     Other Instructions   Important Information About Sugar         Hilbert Corrigan, [place]  01/03/2022 9:38 PM    [place]

## 2022-01-01 NOTE — Patient Instructions (Signed)
Medication Instructions:  Your physician recommends that you continue on your current medications as directed. Please refer to the Current Medication list given to you today.   *If you need a refill on your cardiac medications before your next appointment, please call your pharmacy*   Lab Work: NONE ordered at this time of appointment   If you have labs (blood work) drawn today and your tests are completely normal, you will receive your results only by: [place] (if you have MyChart) OR A paper copy in the mail If you have any lab test that is abnormal or we need to change your treatment, we will call you to review the results.   Testing/Procedures: NONE ordered at this time of appointment     Follow-Up: At [facility], you and your health needs are our priority.  As part of our continuing mission to provide you with exceptional heart care, we have created designated Provider Care Teams.  These Care Teams include your primary Cardiologist (physician) and Advanced Practice Providers (APPs -  Physician Assistants and Nurse Practitioners) who all work together to provide you with the care you need, when you need it.  We recommend signing up for the patient portal called "MyChart".  Sign up information is provided on this After Visit Summary.  MyChart is used to connect with patients for Virtual Visits (Telemedicine).  Patients are able to view lab/test results, encounter notes, upcoming appointments, etc.  Non-urgent messages can be sent to your provider as well.   To learn more about what you can do with MyChart, go to [email or web address].    Your next appointment:    Keep upcoming appointment  The format for your next appointment:   In Person  Provider:   Sanda Klein, MD     Other Instructions   Important Information About Sugar

## 2022-01-03 ENCOUNTER — Encounter: Payer: Self-pay | Admitting: Physician Assistant

## 2022-01-03 DIAGNOSIS — M1991 Primary osteoarthritis, unspecified site: Secondary | ICD-10-CM | POA: Diagnosis not present

## 2022-01-03 DIAGNOSIS — M0579 Rheumatoid arthritis with rheumatoid factor of multiple sites without organ or systems involvement: Secondary | ICD-10-CM | POA: Diagnosis not present

## 2022-01-03 DIAGNOSIS — Z681 Body mass index (BMI) 19 or less, adult: Secondary | ICD-10-CM | POA: Diagnosis not present

## 2022-01-03 DIAGNOSIS — M79641 Pain in right hand: Secondary | ICD-10-CM | POA: Diagnosis not present

## 2022-01-03 DIAGNOSIS — M79642 Pain in left hand: Secondary | ICD-10-CM | POA: Diagnosis not present

## 2022-01-16 DIAGNOSIS — M0579 Rheumatoid arthritis with rheumatoid factor of multiple sites without organ or systems involvement: Secondary | ICD-10-CM | POA: Diagnosis not present

## 2022-01-28 ENCOUNTER — Other Ambulatory Visit: Payer: Self-pay

## 2022-01-28 DIAGNOSIS — I4891 Unspecified atrial fibrillation: Secondary | ICD-10-CM

## 2022-01-28 DIAGNOSIS — I480 Paroxysmal atrial fibrillation: Secondary | ICD-10-CM

## 2022-01-28 NOTE — Progress Notes (Signed)
Order for echo placed

## 2022-01-28 NOTE — Telephone Encounter (Signed)
Please schedule ECHO.   Thank you!

## 2022-01-28 NOTE — Telephone Encounter (Signed)
Please schedule for an echocardiogram for atrial fibrillation

## 2022-01-29 ENCOUNTER — Ambulatory Visit (HOSPITAL_COMMUNITY): Payer: Medicare Other | Attending: Cardiovascular Disease

## 2022-01-29 DIAGNOSIS — I480 Paroxysmal atrial fibrillation: Secondary | ICD-10-CM | POA: Insufficient documentation

## 2022-01-29 LAB — ECHOCARDIOGRAM COMPLETE
Area-P 1/2: 3.34 cm2
Calc EF: 54.4 %
S' Lateral: 2.4 cm
Single Plane A2C EF: 54.5 %
Single Plane A4C EF: 53.6 %

## 2022-01-30 DIAGNOSIS — M0579 Rheumatoid arthritis with rheumatoid factor of multiple sites without organ or systems involvement: Secondary | ICD-10-CM | POA: Diagnosis not present

## 2022-01-30 MED ORDER — METOPROLOL SUCCINATE ER 25 MG PO TB24: 37.5000 mg | ORAL_TABLET | Freq: Two times a day (BID) | ORAL | 3 refills | Status: DC

## 2022-01-30 NOTE — Addendum Note (Signed)
Addended by: Caprice Beaver T on: 01/30/2022 10:27 AM   Modules accepted: Orders

## 2022-02-07 ENCOUNTER — Ambulatory Visit: Payer: TRICARE For Life (TFL) | Admitting: Internal Medicine

## 2022-02-12 ENCOUNTER — Ambulatory Visit (HOSPITAL_BASED_OUTPATIENT_CLINIC_OR_DEPARTMENT_OTHER)
Admission: RE | Admit: 2022-02-12 | Discharge: 2022-02-12 | Disposition: A | Discharge status: Home / Self Care | Payer: Medicare Other | Source: Ambulatory Visit | Attending: Emergency Medicine | Admitting: Emergency Medicine

## 2022-02-12 DIAGNOSIS — J479 Bronchiectasis, uncomplicated: Secondary | ICD-10-CM | POA: Insufficient documentation

## 2022-02-13 DIAGNOSIS — M0579 Rheumatoid arthritis with rheumatoid factor of multiple sites without organ or systems involvement: Secondary | ICD-10-CM | POA: Diagnosis not present

## 2022-02-20 ENCOUNTER — Ambulatory Visit: Payer: TRICARE For Life (TFL) | Admitting: Internal Medicine

## 2022-02-25 ENCOUNTER — Other Ambulatory Visit (HOSPITAL_BASED_OUTPATIENT_CLINIC_OR_DEPARTMENT_OTHER): Payer: Self-pay

## 2022-02-25 DIAGNOSIS — Z23 Encounter for immunization: Secondary | ICD-10-CM | POA: Diagnosis not present

## 2022-02-25 MED ORDER — COMIRNATY 30 MCG/0.3ML IM SUSY
PREFILLED_SYRINGE | INTRAMUSCULAR | 0 refills | Status: DC
  Filled 2022-02-25: qty 0.3, 1d supply, fill #0

## 2022-02-28 ENCOUNTER — Encounter: Payer: Self-pay | Admitting: Internal Medicine

## 2022-02-28 ENCOUNTER — Ambulatory Visit (INDEPENDENT_AMBULATORY_CARE_PROVIDER_SITE_OTHER): Payer: Medicare Other | Admitting: Internal Medicine

## 2022-02-28 DIAGNOSIS — G4700 Insomnia, unspecified: Secondary | ICD-10-CM | POA: Diagnosis not present

## 2022-02-28 DIAGNOSIS — I480 Paroxysmal atrial fibrillation: Secondary | ICD-10-CM

## 2022-02-28 DIAGNOSIS — M059 Rheumatoid arthritis with rheumatoid factor, unspecified: Secondary | ICD-10-CM

## 2022-02-28 DIAGNOSIS — J479 Bronchiectasis, uncomplicated: Secondary | ICD-10-CM

## 2022-02-28 MED ORDER — CIMZIA 2 X 200 MG ~~LOC~~ KIT: PACK | SUBCUTANEOUS | 1 refills | Status: AC

## 2022-02-28 NOTE — Assessment & Plan Note (Signed)
Cont on Eliquis- 

## 2022-02-28 NOTE — Assessment & Plan Note (Signed)
Pt declined Rx

## 2022-02-28 NOTE — Assessment & Plan Note (Signed)
On Cimzia and MTX now for RA

## 2022-02-28 NOTE — Assessment & Plan Note (Signed)
Vaccines discussed

## 2022-02-28 NOTE — Progress Notes (Signed)
Subjective:  Patient ID: Monica [name], female    DOB: 1945-10-22  Age: 76 y.o. MRN: [id number]  CC: Follow-up (3 month f/u)   HPI Monica [name] presents for RA, A fib, bronchiectases On Cimzia and MTX now for RA Not feeling well after COVID inj  Outpatient Medications Prior to Visit  Medication Sig Dispense Refill   acetaminophen (TYLENOL) 500 MG tablet Take 500 mg by mouth every 6 (six) hours as needed for moderate pain or headache.     calcium carbonate (TUMS EX) 750 MG chewable tablet Chew 1,500 mg by mouth at bedtime.     CALCIUM-MAGNESIUM-ZINC PO Take 1 tablet by mouth daily.     Cholecalciferol (VITAMIN D) 1000 UNITS capsule Take 1,000 Units by mouth daily.     dextromethorphan (DELSYM) 30 MG/5ML liquid Take 30 mg by mouth at bedtime as needed for cough.      ELIQUIS 5 MG TABS tablet TAKE 1 TABLET TWICE A DAY 180 tablet 3   Estradiol 10 MCG TABS Place 10 mcg vaginally 3 (three) times a week.     famotidine (PEPCID AC MAXIMUM STRENGTH) 20 MG tablet 2 tablets at bedtime as needed Orally Once a day     fluticasone (FLONASE ALLERGY RELIEF) 50 MCG/ACT nasal spray      Folic Acid 20 MG CAPS Take by mouth. Take 1 capsule by mouth daily     guaiFENesin (MUCINEX) 600 MG 12 hr tablet Take 1 tablet every 12 hours by oral route.     Lactobacillus Rhamnosus, GG, (CULTURELLE PO) Take 1 tablet by mouth daily.     loratadine (CLARITIN) 10 MG tablet Take 10 mg by mouth daily.     methotrexate (RHEUMATREX) 2.5 MG tablet Take 10 mg by mouth once a week.     metoprolol succinate (TOPROL XL) 25 MG 24 hr tablet Take 1.5 tablets (37.5 mg total) by mouth in the morning and at bedtime. 180 tablet 3   metoprolol tartrate (LOPRESSOR) 25 MG tablet Take 0.5 tablets (12.5 mg total) by mouth daily as needed. 90 tablet 3   Multiple Vitamins-Minerals (CENTRUM SILVER) tablet See admin instructions.     Respiratory Therapy Supplies (FLUTTER) DEVI Twice a day and prn as needed, may increase if feeling worse 1  each 0   traMADol (ULTRAM) 50 MG tablet Take 1 tablet by mouth every 8 (eight) hours as needed.     COVID-19 mRNA vaccine 2023-2024 (COMIRNATY) syringe Inject into the muscle. (Patient not taking: Reported on 04/28/8249) 0.3 mL 0   folic acid (FOLVITE) 1 MG tablet 2 mg. (Patient not taking: Reported on 02/28/2022)     No facility-administered medications prior to visit.    ROS: Review of Systems  Constitutional:  Negative for activity change, appetite change, chills, fatigue and unexpected weight change.  HENT:  Negative for congestion, mouth sores and sinus pressure.   Eyes:  Negative for visual disturbance.  Respiratory:  Negative for cough and chest tightness.   Gastrointestinal:  Negative for abdominal pain and nausea.  Genitourinary:  Negative for difficulty urinating, frequency and vaginal pain.  Musculoskeletal:  Positive for arthralgias and gait problem. Negative for back pain.  Skin:  Negative for pallor and rash.  Neurological:  Negative for dizziness, tremors, weakness, numbness and headaches.  Psychiatric/Behavioral:  Positive for sleep disturbance. Negative for confusion, decreased concentration and suicidal ideas.     Objective:  BP 110/70 (BP Location: Left Arm)   Pulse 96   Temp 98 F (36.7  C) (Oral)   Ht 5' 1.5" (1.562 m)   Wt 103 lb 12.8 oz (47.1 kg)   SpO2 97%   BMI 19.30 kg/m   BP Readings from Last 3 Encounters:  02/28/22 110/70  01/01/22 118/74  12/19/21 (!) 120/44    Wt Readings from Last 3 Encounters:  02/28/22 103 lb 12.8 oz (47.1 kg)  01/01/22 100 lb 9.6 oz (45.6 kg)  12/13/21 100 lb 9.6 oz (45.6 kg)    Physical Exam Constitutional:      General: She is not in acute distress.    Appearance: Normal appearance. She is well-developed.  HENT:     Head: Normocephalic.     Right Ear: External ear normal.     Left Ear: External ear normal.     Nose: Nose normal.  Eyes:     General:        Right eye: No discharge.        Left eye: No  discharge.     Conjunctiva/sclera: Conjunctivae normal.     Pupils: Pupils are equal, round, and reactive to light.  Neck:     Thyroid: No thyromegaly.     Vascular: No JVD.     Trachea: No tracheal deviation.  Cardiovascular:     Rate and Rhythm: Normal rate and regular rhythm.     Heart sounds: Normal heart sounds.  Pulmonary:     Effort: No respiratory distress.     Breath sounds: No stridor. No wheezing.  Abdominal:     General: Bowel sounds are normal. There is no distension.     Palpations: Abdomen is soft. There is no mass.     Tenderness: There is no abdominal tenderness. There is no guarding or rebound.  Musculoskeletal:        General: Tenderness present.     Cervical back: Normal range of motion and neck supple. No rigidity.  Lymphadenopathy:     Cervical: No cervical adenopathy.  Skin:    Findings: No erythema or rash.  Neurological:     Cranial Nerves: No cranial nerve deficit.     Motor: No abnormal muscle tone.     Coordination: Coordination normal.     Deep Tendon Reflexes: Reflexes normal.  Psychiatric:        Behavior: Behavior normal.        Thought Content: Thought content normal.        Judgment: Judgment normal.     Lab Results  Component Value Date   WBC 17.9 (H) 09/12/2021   HGB 12.2 09/12/2021   HCT 36.3 09/12/2021   PLT 453 (H) 09/12/2021   GLUCOSE 115 (H) 09/12/2021   CHOL 140 07/06/2018   TRIG 41.0 07/06/2018   HDL 62.80 07/06/2018   LDLCALC 69 07/06/2018   ALT 13 09/12/2021   AST 19 09/12/2021   NA 135 09/12/2021   K 3.8 09/12/2021   CL 100 09/12/2021   CREATININE 0.76 09/12/2021   BUN 17 09/12/2021   CO2 27 09/12/2021   TSH 3.09 05/30/2021    CT Chest Wo Contrast  Result Date: 02/13/2022 CLINICAL DATA:  Follow-up bronchiectasis.  Chronic cough. EXAM: CT CHEST WITHOUT CONTRAST TECHNIQUE: Multidetector CT imaging of the chest was performed following the standard protocol without IV contrast. RADIATION DOSE REDUCTION: This exam  was performed according to the departmental dose-optimization program which includes automated exposure control, adjustment of the mA and/or kV according to patient size and/or use of iterative reconstruction technique. COMPARISON:  Radiographs 09/12/2021 and 08/10/2021.  CT 02/12/2021 and 07/10/2018. FINDINGS: Cardiovascular: Mild atherosclerosis of the aorta without acute vascular findings on noncontrast imaging. The heart size is normal. There is no pericardial effusion. Mediastinum/Nodes: There are no enlarged mediastinal, hilar or axillary lymph nodes.Hilar assessment is limited by the lack of intravenous contrast, although the hilar contours appear unchanged. Small hiatal hernia. The thyroid gland and trachea appear unremarkable. Lungs/Pleura: No pleural effusion or pneumothorax. Redemonstrated is diffuse cylindrical bronchiectasis with multifocal scarring in both lungs, greatest in the right middle lobe and lingula. Clustered nodularity in both lung bases appears unchanged. No confluent airspace opacity or enlarging pulmonary nodules are identified. There is stable biapical scarring. Upper abdomen: The visualized upper abdomen appears stable without significant findings. Musculoskeletal/Chest wall: There is no chest wall mass or suspicious osseous finding. IMPRESSION: 1. 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 2. No acute chest findings. 3. Small hiatal hernia. 4.  Aortic Atherosclerosis (ICD10-I70.0). Electronically Signed   By: Richardean Sale M.D.   On: 02/13/2022 18:22    Assessment & Plan:   Problem List Items Addressed This Visit     Bronchiectasis without acute exacerbation ([place])    Vaccines discussed      Insomnia disorder    Pt declined Rx      Paroxysmal atrial fibrillation (HCC)    Cont on Eliquis      Rheumatoid arthritis (HCC)    On  Cimzia and MTX now for RA         Meds ordered this encounter  Medications   certolizumab pegol (CIMZIA) 2 X 200 MG KIT    Sig: Per Rheumatology    Dispense:  1 kit    Refill:  1      Follow-up: Return in about 6 months (around 08/30/2022) for a follow-up visit.  Walker Kehr, MD

## 2022-03-07 ENCOUNTER — Telehealth: Payer: Self-pay | Admitting: *Deleted

## 2022-03-07 NOTE — Telephone Encounter (Signed)
Rec'd fax pt needing PA on Cimzia 2x submitted w/ (Key: [id number]) Pa sent to insurance.Marland KitchenJohny Chess

## 2022-03-12 NOTE — Telephone Encounter (Signed)
Rec'd determination med was denied. It states coverage is provided in situations where the patient has tried Humira or contradiction to Humira.Marland KitchenJohny [name]

## 2022-03-13 DIAGNOSIS — R5383 Other fatigue: Secondary | ICD-10-CM | POA: Diagnosis not present

## 2022-03-13 DIAGNOSIS — Z79899 Other long term (current) drug therapy: Secondary | ICD-10-CM | POA: Diagnosis not present

## 2022-03-13 DIAGNOSIS — M0579 Rheumatoid arthritis with rheumatoid factor of multiple sites without organ or systems involvement: Secondary | ICD-10-CM | POA: Diagnosis not present

## 2022-03-13 NOTE — Telephone Encounter (Signed)
This PA should have gone to Dr. Trudie Reed (Rheumatology).  Thank you

## 2022-03-20 ENCOUNTER — Ambulatory Visit (INDEPENDENT_AMBULATORY_CARE_PROVIDER_SITE_OTHER): Payer: Medicare Other | Admitting: Emergency Medicine

## 2022-03-20 ENCOUNTER — Encounter: Payer: Self-pay | Admitting: Emergency Medicine

## 2022-03-20 VITALS — BP 112/60 | HR 91 | Temp 98.0°F | Ht 61.5 in | Wt 103.4 lb

## 2022-03-20 DIAGNOSIS — K219 Gastro-esophageal reflux disease without esophagitis: Secondary | ICD-10-CM

## 2022-03-20 DIAGNOSIS — J301 Allergic rhinitis due to pollen: Secondary | ICD-10-CM

## 2022-03-20 DIAGNOSIS — J479 Bronchiectasis, uncomplicated: Secondary | ICD-10-CM | POA: Diagnosis not present

## 2022-03-20 NOTE — Assessment & Plan Note (Signed)
Continue loratadine once daily Continue fluticasone nasal spray as you have been using it

## 2022-03-20 NOTE — Patient Instructions (Addendum)
We reviewed your CT scan of the chest today. Please continue to use your flutter valve as needed to help clear mucus Continue Pepcid as you have been taking Continue loratadine once daily Continue fluticasone nasal spray as you have been using it Agree with getting the flu shot as planned Would recommend that you get the RSV vaccine this fall Follow with Dr Lamonte Sakai in 6 months or sooner if you have any problems

## 2022-03-20 NOTE — Assessment & Plan Note (Signed)
Overall stable, some mild increased cough since she had COVID-19 vaccine but managing secretions adequately.  Does use flutter valve and can continue to do so.  Her CT chest is stable as well.  If she has clinical progression or if her CT progresses then she needs repeat cultures done, AFB.  This could either be by sputum or by bronchoscopy.  For now plan to continue maintenance regimen  We reviewed your CT scan of the chest today. Please continue to use your flutter valve as needed to help clear mucus Agree with getting the flu shot as planned Would recommend that you get the RSV vaccine this fall Follow with Dr Lamonte Sakai in 6 months or sooner if you have any problems

## 2022-03-20 NOTE — Progress Notes (Signed)
Subjective:    Patient ID: Monica [name], female    DOB: [birth year], 76 y.o.   MRN: [id number]  HPI  ROV 12/13/2021 --76 year old woman with a history of bronchiectasis, upper airway irritation syndrome and associated chronic cough.  No evidence of obstructive disease on her methacholine challenge in the past.  She is colonized with Pseudomonas, has been negative for mycobacterial disease in the past.  Unfortunately she reports today that she has had COVID x2 Most recent chest imaging was a CT scan 02/12/2021 COVID in March - some diarrhea with molnupiravir. Then COVID again in May She has also been dx with RA, has received pred and now on MTX since mid March.  She has had increased cough since COVID in May. She has some GERD, does lead to cough, she is on famotidine. Cough is minimally productive but is pale yellow.  Not using flonase currently but will start in fall.  ROV 03/20/2022 --76 year old woman with bronchiectasis and chronic cough, upper airway irritation syndrome without any evidence of obstruction on methacholine.  She is colonized with Pseudomonas.  Also diagnosed with rheumatoid arthritis and on methotrexate, started on Cimzia since I last saw her. She has overall been doing fairly well. She has had some increased cough and overall inflammation since she had her covid shot last month. She is coughing up some pale yellow, sometimes associated with GERD. She is taking pepcid 1-2x a day. On loratadine, fluticasone NS.   CT chest 02/12/2022 reviewed by me, shows stable diffuse cylindrical bronchiectasis with some associated micronodularity, unchanged                                                                                                                                                                                                               Review of Systems As per history of present illness     Objective:   Physical Exam Vitals:   03/20/22 1152  BP: 112/60   Pulse: 91  Temp: 98 F (36.7 C)  TempSrc: Oral  SpO2: 98%  Weight: 103 lb 6.4 oz (46.9 kg)  Height: 5' 1.5" (1.562 m)   'Gen: Pleasant, thin woman, well-appearing, in no distress,  normal affect  ENT: No lesions,  mouth clear,  oropharynx clear, no postnasal drip  Neck: No JVD, no stridor  Lungs: No use of accessory muscles, clear without rales or rhonchi, no wheeze   Cardiovascular: RRR, heart sounds normal, no murmur or gallops, no peripheral edema  Musculoskeletal: No deformities, no cyanosis or clubbing  Neuro:  alert, non focal  Skin: Warm, no lesions or rashes        Assessment & Plan:  Bronchiectasis without acute exacerbation (HCC) Overall stable, some mild increased cough since she had COVID-19 vaccine but managing secretions adequately.  Does use flutter valve and can continue to do so.  Her CT chest is stable as well.  If she has clinical progression or if her CT progresses then she needs repeat cultures done, AFB.  This could either be by sputum or by bronchoscopy.  For now plan to continue maintenance regimen  We reviewed your CT scan of the chest today. Please continue to use your flutter valve as needed to help clear mucus Agree with getting the flu shot as planned Would recommend that you get the RSV vaccine this fall Follow with Dr Lamonte Sakai in 6 months or sooner if you have any problems  Seasonal allergic rhinitis Continue loratadine once daily Continue fluticasone nasal spray as you have been using it  GERD Has been difficult to treat.  No real control with PPI in the past and she is concerned about the side effects.  Plan to continue Pepcid as she has been taking  Baltazar Apo, MD, PhD 03/20/2022, 1:00 PM [facility] [phone] or if no answer [phone]

## 2022-03-20 NOTE — Assessment & Plan Note (Signed)
Has been difficult to treat.  No real control with PPI in the past and she is concerned about the side effects.  Plan to continue Pepcid as she has been taking

## 2022-03-25 ENCOUNTER — Other Ambulatory Visit (HOSPITAL_BASED_OUTPATIENT_CLINIC_OR_DEPARTMENT_OTHER): Payer: Self-pay

## 2022-03-25 DIAGNOSIS — Z23 Encounter for immunization: Secondary | ICD-10-CM | POA: Diagnosis not present

## 2022-03-25 MED ORDER — INFLUENZA VAC A&B SA ADJ QUAD 0.5 ML IM PRSY
PREFILLED_SYRINGE | INTRAMUSCULAR | 0 refills | Status: DC
  Filled 2022-03-25: qty 0.5, 1d supply, fill #0

## 2022-03-27 ENCOUNTER — Ambulatory Visit: Payer: TRICARE For Life (TFL) | Admitting: Emergency Medicine

## 2022-03-27 ENCOUNTER — Encounter: Payer: Self-pay | Admitting: Emergency Medicine

## 2022-03-27 ENCOUNTER — Ambulatory Visit (INDEPENDENT_AMBULATORY_CARE_PROVIDER_SITE_OTHER): Payer: Medicare Other | Admitting: Emergency Medicine

## 2022-03-27 VITALS — BP 122/68 | HR 97 | Temp 97.6°F | Ht 61.5 in | Wt 105.0 lb

## 2022-03-27 DIAGNOSIS — G8929 Other chronic pain: Secondary | ICD-10-CM | POA: Diagnosis not present

## 2022-03-27 DIAGNOSIS — M25571 Pain in right ankle and joints of right foot: Secondary | ICD-10-CM | POA: Diagnosis not present

## 2022-03-27 DIAGNOSIS — M059 Rheumatoid arthritis with rheumatoid factor, unspecified: Secondary | ICD-10-CM | POA: Diagnosis not present

## 2022-03-27 DIAGNOSIS — M25471 Effusion, right ankle: Secondary | ICD-10-CM | POA: Diagnosis not present

## 2022-03-27 NOTE — Patient Instructions (Signed)
Ankle Pain The ankle joint holds your body weight and allows you to move around. Ankle pain can occur on either side or the back of one ankle or both ankles. Ankle pain may be sharp and burning or dull and aching. There may be tenderness, stiffness, redness, or warmth around the ankle. Many things can cause ankle pain, including an injury to the area and overuse of the ankle. Follow these instructions at home: Activity Rest your ankle as told by your health care provider. Avoid any activities that cause ankle pain. Do not use the injured limb to support your body weight until your health care provider says that you can. Use crutches as told by your health care provider. Do exercises as told by your health care provider. Ask your health care provider when it is safe to drive if you have a brace on your ankle. If you have a brace: Wear the brace as told by your health care provider. Remove it only as told by your health care provider. Loosen the brace if your toes tingle, become numb, or turn cold and blue. Keep the brace clean. If the brace is not waterproof: Do not let it get wet. Cover it with a watertight covering when you take a bath or shower. If you were given an elastic bandage:  Remove it when you take a bath or a shower. Try not to move your ankle very much, but wiggle your toes from time to time. This helps to prevent swelling. Adjust the bandage to make it more comfortable if it feels too tight. Loosen the bandage if you have numbness or tingling in your foot or if your foot turns cold and blue. Managing pain, stiffness, and swelling  If directed, put ice on the painful area. If you have a removable brace or elastic bandage, remove it as told by your health care provider. Put ice in a plastic bag. Place a towel between your skin and the bag. Leave the ice on for 20 minutes, 2-3 times a day. Move your toes often to avoid stiffness and to lessen swelling. Raise (elevate) your  ankle above the level of your heart while you are sitting or lying down. General instructions Record information about your pain. Writing down the following may be helpful for you and your health care provider: How often you have ankle pain. Where the pain is located. What the pain feels like. If treatment involves wearing a prescribed shoe or insole, make sure you wear it correctly and for as long as told by your health care provider. Take over-the-counter and prescription medicines only as told by your health care provider. Keep all follow-up visits as told by your health care provider. This is important. Contact a health care provider if: Your pain gets worse. Your pain is not relieved with medicines. You have a fever or chills. You are having more trouble with walking. You have new symptoms. Get help right away if: Your foot, leg, toes, or ankle: Tingles or becomes numb. Becomes swollen. Turns pale or blue. Summary Ankle pain can occur on either side or the back of one ankle or both ankles. Ankle pain may be sharp and burning or dull and aching. Rest your ankle as told by your health care provider. If told, apply ice to the area. Take over-the-counter and prescription medicines only as told by your health care provider. This information is not intended to replace advice given to you by your health care provider. Make sure you discuss   any questions you have with your health care provider. Document Revised: 06/13/2020 Document Reviewed: 06/15/2020 Elsevier Patient Education  2023 [employer]  

## 2022-03-27 NOTE — Assessment & Plan Note (Signed)
Possibly related to rheumatoid arthritis. Tendinitis a possibility. No red flag signs or symptoms. Benign physical examination. Recommend to use Tylenol for mild to moderate pain and tramadol for moderate to severe pain. Cold compresses help.  Heat not helping her. Rest with occasional elevation recommended. Follow-up with both rheumatologist and PCP.

## 2022-03-27 NOTE — Assessment & Plan Note (Signed)
Recommend rest and elevation.  Cold compresses as needed. Compression not helping. No recent injuries.  Benign examination.

## 2022-03-27 NOTE — Progress Notes (Signed)
Monica [name] 76 y.o.   Chief Complaint  Patient presents with   Acute Visit    Pain and swelling in her rt ankle x 1 month, patient states she has rheumatoid arthritis     HISTORY OF PRESENT ILLNESS: This is a 76 y.o. female with history of rheumatoid arthritis complaining of right ankle pain on and off for the past month. Pain is waxing and waning.  Denies injury. Has history of paroxysmal atrial fibrillation on Eliquis.  Unable to take NSAIDs. Has been taking Tylenol for pain.  Has tramadol at home and will take it occasionally. No other associated symptoms. No other complaints or medical concerns today.  HPI   Prior to Admission medications   Medication Sig Start Date End Date Taking? Authorizing Provider  acetaminophen (TYLENOL) 500 MG tablet Take 500 mg by mouth every 6 (six) hours as needed for moderate pain or headache.   Yes [provider]  calcium carbonate (TUMS EX) 750 MG chewable tablet Chew 1,500 mg by mouth at bedtime.   Yes [provider]  CALCIUM-MAGNESIUM-ZINC PO Take 500 mg by mouth daily.   Yes [provider]  certolizumab pegol (CIMZIA) 2 X 200 MG KIT Per Rheumatology 02/28/22  Yes Plotnikov, Evie Lacks, MD  Cholecalciferol (VITAMIN D) 1000 UNITS capsule Take 1,000 Units by mouth daily.   Yes [provider]  dextromethorphan (DELSYM) 30 MG/5ML liquid Take 30 mg by mouth at bedtime as needed for cough.    Yes [provider]  ELIQUIS 5 MG TABS tablet TAKE 1 TABLET TWICE A DAY 05/21/21  Yes Croitoru, Mihai, MD  Estradiol 10 MCG TABS Place 10 mcg vaginally 3 (three) times a week.   Yes [provider]  famotidine (PEPCID AC MAXIMUM STRENGTH) 20 MG tablet 2 tablets at bedtime as needed Orally Once a day   Yes [provider]  fluticasone (FLONASE ALLERGY RELIEF) 50 MCG/ACT nasal spray    Yes [provider]  Folic Acid 20 MG CAPS Take 2 capsules by mouth. Take 1 capsule by mouth daily   Yes  [provider]  guaiFENesin (MUCINEX) 600 MG 12 hr tablet Take 1 tablet every 12 hours by oral route.   Yes [provider]  influenza vaccine adjuvanted (FLUAD) 0.5 ML injection Inject into the muscle. 03/25/22  Yes Carlyle Basques, MD  Lactobacillus Rhamnosus, GG, (CULTURELLE PO) Take 1 tablet by mouth daily.   Yes [provider]  loratadine (CLARITIN) 10 MG tablet Take 10 mg by mouth daily.   Yes [provider]  methotrexate (RHEUMATREX) 2.5 MG tablet Take 10 mg by mouth once a week. 08/28/21  Yes [provider]  metoprolol succinate (TOPROL XL) 25 MG 24 hr tablet Take 1.5 tablets (37.5 mg total) by mouth in the morning and at bedtime. 01/30/22 09/27/22 Yes Croitoru, Mihai, MD  metoprolol tartrate (LOPRESSOR) 25 MG tablet Take 0.5 tablets (12.5 mg total) by mouth daily as needed. 11/07/21  Yes Plotnikov, Evie Lacks, MD  Multiple Vitamins-Minerals (CENTRUM SILVER) tablet See admin instructions.   Yes [provider]  Respiratory Therapy Supplies (FLUTTER) DEVI Twice a day and prn as needed, may increase if feeling worse 01/30/18  Yes Lauraine Rinne, NP  traMADol (ULTRAM) 50 MG tablet Take 1 tablet by mouth every 8 (eight) hours as needed.   Yes [provider]  Zoledronic Acid (RECLAST IV) Inject into the vein. IV yearly   Yes [provider]    Allergies  Allergen Reactions   Amoxicillin Other (See Comments)   Moxifloxacin Other (See Comments)   Nitrofurantoin Other (See Comments)   Albuterol Other (See Comments)    Patient states put her into atrial fib last time she took this medication   Amoxicillin-Pot Clavulanate Diarrhea    Has patient had a PCN reaction causing immediate rash, facial/tongue/throat swelling, SOB or lightheadedness with hypotension:No Has patient had a PCN reaction causing severe rash involving mucus membranes or skin necrosis:No Has patient had a PCN reaction that required hospitalization:No Has  patient had a PCN reaction occurring within the last 10 years:Yes If all of the above answers are "NO", then may proceed with Cephalosporin use   Avelox [Moxifloxacin Hcl In Nacl] Nausea Only   Benzonatate Other (See Comments)    felt bad, out of sorts, disoriented.   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   OSTEOPOROSIS 01/26/2007    Past Medical History:  Diagnosis Date   Adenomatous colon polyp 10/2003   Allergic rhinitis    Allergy    SEASONAL   Bronchiectasis    Chronic sinusitis    Diverticulosis    GERD (gastroesophageal reflux disease)    Hearing loss    Hemorrhoids    Insomnia    Menopausal disorder    Osteoporosis    Palpitations    Paroxysmal A-fib (HCC)    Rheumatoid arthritis (HCC)    TMJ syndrome    Urticaria     Past Surgical History:  Procedure Laterality Date   COLONOSCOPY     NASAL SINUS SURGERY     TYMPANOSTOMY     VIDEO BRONCHOSCOPY Bilateral 07/16/2016    Procedure: VIDEO BRONCHOSCOPY WITH FLUORO;  Surgeon: Collene Gobble, MD;  Location: [facility];  Service: Cardiopulmonary;  Laterality: Bilateral;    Social History   Socioeconomic History   Marital status: Widowed    Spouse name: [name]   Number of children: 0   Years of education: BSN   Highest education level: Not on file  Occupational History   Occupation: retired Therapist, sports  Tobacco Use   Smoking status: Never   Smokeless tobacco: Never  Vaping Use   Vaping Use: Never used  Substance and Sexual Activity   Alcohol use: Yes    Alcohol/week: 1.0 - 2.0 standard drink of alcohol    Types: 1 - 2 Glasses of wine per week    Comment: 1-2 glasses of wine a month 05/29/21   Drug use: No   Sexual activity: Not Currently  Other Topics Concern   Not  on file  Social History Narrative   ** Merged History Encounter **       Lives with husband Caffeine use: 1 cup tea per day   Social Determinants of Health   Financial Resource Strain: Low Risk  (07/17/2021)   Overall Financial Resource Strain (CARDIA)    Difficulty of Paying Living Expenses: Not hard at all  Food Insecurity: No Food Insecurity (07/17/2021)   Hunger Vital Sign    Worried About Running Out of Food in the Last Year: Never true    Ran Out of Food in the Last Year: Never true  Transportation Needs: No Transportation Needs (07/17/2021)   PRAPARE - Hydrologist (Medical): No    Lack of Transportation (Non-Medical): No  Physical Activity: Insufficiently Active (07/17/2021)   Exercise Vital Sign    Days of Exercise per Week: 7 days    Minutes of Exercise per Session: 20 min  Stress: No Stress Concern Present (07/17/2021)   [place]    Feeling of Stress : Not at all  Social Connections: Unknown (07/17/2021)   Social Connection and Isolation Panel [NHANES]    Frequency of Communication with Friends and Family: More than three times a week     Frequency of Social Gatherings with Friends and Family: Once a week    Attends Religious Services: Patient refused    Active Member of Clubs or Organizations: Patient refused    Attends Archivist Meetings: Patient refused    Marital Status: Widowed  Intimate Partner Violence: Not At Risk (07/17/2021)   Humiliation, Afraid, Rape, and Kick questionnaire    Fear of Current or Ex-Partner: No    Emotionally Abused: No    Physically Abused: No    Sexually Abused: No    Family History  Problem Relation Age of Onset   Rectal cancer Mother        died age 4   Seizures Mother    Colon cancer Mother 33   Cancer Mother    Heart disease Father        dies age 42   Hypertension Father    Heart disease Maternal Grandfather    AVM Brother    Cancer Maternal Grandmother    Cancer Paternal Uncle        multiple uncles with cancer   Esophageal cancer Neg Hx      Review of Systems  Constitutional: Negative.  Negative for chills and fever.  Respiratory:  Negative for cough.   Cardiovascular:  Negative for chest pain.  Gastrointestinal:  Negative for nausea and vomiting.  Musculoskeletal:  Positive for joint pain.  Skin: Negative.  Negative for rash.  Neurological:  Negative for dizziness and headaches.   Today's Vitals   03/27/22 0853  BP: 122/68  Pulse: 97  Temp: 97.6 F (36.4 C)  TempSrc: Oral  SpO2: 97%  Weight: 105 lb (47.6 kg)  Height: 5' 1.5" (1.562 m)   Body mass index is 19.52 kg/m.   Physical Exam Vitals reviewed.  Constitutional:      Appearance: Normal appearance.  HENT:     Head: Normocephalic.  Eyes:     Extraocular Movements: Extraocular movements intact.  Cardiovascular:     Rate and Rhythm: Normal rate.  Pulmonary:     Effort: Pulmonary effort is normal.  Musculoskeletal:     Comments: Right ankle: No erythema or ecchymosis.  No swelling.  Full range of motion.  Mild tenderness to moderate palpation.  Neurovascularly intact.  Good  capillary refill. Right foot normal.  Skin:    General: Skin is warm and dry.  Neurological:     Mental Status: She is alert and oriented to person, place, and time.  Psychiatric:        Mood and Affect: Mood normal.        Behavior: Behavior normal.      ASSESSMENT & PLAN: A total of 32 minutes was spent with the patient and counseling/coordination of care regarding preparing for this visit, review of available medical records, review of past medical history, review of chronic medical problems under management, review of all medications, differential diagnosis of ankle swelling and pain, pain management, prognosis, documentation, need for follow-up with rheumatologist and PCP.  Problem List Items Addressed This Visit       Musculoskeletal and Integument   Rheumatoid arthritis ([place])     Other   Right ankle swelling - Primary    Recommend rest and elevation.  Cold compresses as needed. Compression not helping. No recent injuries.  Benign examination.      Chronic pain of right ankle    Possibly related to rheumatoid arthritis. Tendinitis a possibility. No red flag signs or symptoms. Benign physical examination. Recommend to use Tylenol for mild to moderate pain and tramadol for moderate to severe pain. Cold compresses help.  Heat not helping her. Rest with occasional elevation recommended. Follow-up with both rheumatologist and PCP.      Patient Instructions  Ankle Pain The ankle joint holds your body weight and allows you to move around. Ankle pain can occur on either side or the back of one ankle or both ankles. Ankle pain may be sharp and burning or dull and aching. There may be tenderness, stiffness, redness, or warmth around the ankle. Many things can cause ankle pain, including an injury to the area and overuse of the ankle. Follow these instructions at home: Activity Rest your ankle as told by your health care provider. Avoid any activities that cause ankle  pain. Do not use the injured limb to support your body weight until your health care provider says that you can. Use crutches as told by your health care provider. Do exercises as told by your health care provider. Ask your health care provider when it is safe to drive if you have a brace on your ankle. If you have a brace: Wear the brace as told by your health care provider. Remove it only as told by your health care provider. Loosen the brace if your toes tingle, become numb, or turn cold and blue. Keep the brace clean. If the brace is not waterproof: Do not let it get wet. Cover it with a watertight covering when you take a bath or shower. If you were given an elastic bandage:  Remove it when you take a bath or a shower. Try not to move your ankle very much, but wiggle your toes from time to time. This helps to prevent swelling. Adjust the bandage to make it more comfortable if it feels too tight. Loosen the bandage if you have numbness or tingling in your foot or if your foot turns cold and blue. Managing pain, stiffness, and swelling  If directed, put ice on the painful area. If you have a removable brace or elastic bandage, remove it as told by your health care provider. Put ice in a plastic bag. Place a towel between your skin and the bag.  Leave the ice on for 20 minutes, 2-3 times a day. Move your toes often to avoid stiffness and to lessen swelling. Raise (elevate) your ankle above the level of your heart while you are sitting or lying down. General instructions Record information about your pain. Writing down the following may be helpful for you and your health care provider: How often you have ankle pain. Where the pain is located. What the pain feels like. If treatment involves wearing a prescribed shoe or insole, make sure you wear it correctly and for as long as told by your health care provider. Take over-the-counter and prescription medicines only as told by your  health care provider. Keep all follow-up visits as told by your health care provider. This is important. Contact a health care provider if: Your pain gets worse. Your pain is not relieved with medicines. You have a fever or chills. You are having more trouble with walking. You have new symptoms. Get help right away if: Your foot, leg, toes, or ankle: Tingles or becomes numb. Becomes swollen. Turns pale or blue. Summary Ankle pain can occur on either side or the back of one ankle or both ankles. Ankle pain may be sharp and burning or dull and aching. Rest your ankle as told by your health care provider. If told, apply ice to the area. Take over-the-counter and prescription medicines only as told by your health care provider. This information is not intended to replace advice given to you by your health care provider. Make sure you discuss any questions you have with your health care provider. Document Revised: 06/13/2020 Document Reviewed: 06/15/2020 Elsevier Patient Education  [place], MD [facility]

## 2022-04-04 ENCOUNTER — Ambulatory Visit: Payer: Medicare Other | Attending: Cardiovascular Disease | Admitting: Cardiovascular Disease

## 2022-04-04 ENCOUNTER — Encounter: Payer: Self-pay | Admitting: Cardiovascular Disease

## 2022-04-04 VITALS — BP 110/60 | HR 93 | Ht 61.5 in | Wt 103.6 lb

## 2022-04-04 DIAGNOSIS — I480 Paroxysmal atrial fibrillation: Secondary | ICD-10-CM | POA: Diagnosis not present

## 2022-04-04 DIAGNOSIS — D6869 Other thrombophilia: Secondary | ICD-10-CM | POA: Diagnosis not present

## 2022-04-04 DIAGNOSIS — M0579 Rheumatoid arthritis with rheumatoid factor of multiple sites without organ or systems involvement: Secondary | ICD-10-CM | POA: Diagnosis not present

## 2022-04-04 MED ORDER — METOPROLOL SUCCINATE ER 50 MG PO TB24: 50.0000 mg | ORAL_TABLET | Freq: Two times a day (BID) | ORAL | 3 refills | Status: DC

## 2022-04-04 NOTE — Patient Instructions (Signed)
Medication Instructions:   INCREASE Metoprolol Succinate (Toprol-XL) to 50 mg 2 times a day   *If you need a refill on your cardiac medications before your next appointment, please call your pharmacy*  Lab Work: NONE ordered at this time of appointment   If you have labs (blood work) drawn today and your tests are completely normal, you will receive your results only by: [place] (if you have MyChart) OR A paper copy in the mail If you have any lab test that is abnormal or we need to change your treatment, we will call you to review the results.  Testing/Procedures:  [facility]- Long Term Monitor Instructions  Your physician has requested you wear a ZIO patch monitor for 7 days.  This is a single patch monitor. [employer] supplies one patch monitor per enrollment. Additional stickers are not available. Please do not apply patch if you will be having a Nuclear Stress Test,  Echocardiogram, Cardiac CT, MRI, or Chest Xray during the period you would be wearing the  monitor. The patch cannot be worn during these tests. You cannot remove and re-apply the  ZIO XT patch monitor.  Your ZIO patch monitor will be mailed 3 day USPS to your address on file. It may take 3-5 days  to receive your monitor after you have been enrolled.  Once you have received your monitor, please review the enclosed instructions. Your monitor  has already been registered assigning a specific monitor serial # to you.  Billing and Patient Assistance Program Information  We have supplied [employer] with any of your insurance information on file for billing purposes. [employer] offers a sliding scale Patient Assistance Program for patients that do not have  insurance, or whose insurance does not completely cover the cost of the ZIO monitor.  You must apply for the Patient Assistance Program to qualify for this discounted rate.  To apply, please call [employer] at [phone], select option 4, select option 2, ask to apply for   Patient Assistance Program. Theodore Demark will ask your household income, and how many people  are in your household. They will quote your out-of-pocket cost based on that information.  [employer] will also be able to set up a 79-month interest-free payment plan if needed.  Applying the monitor   Shave hair from upper left chest.  Hold abrader disc by orange tab. Rub abrader in 40 strokes over the upper left chest as  indicated in your monitor instructions.  Clean area with 4 enclosed alcohol pads. Let dry.  Apply patch as indicated in monitor instructions. Patch will be placed under collarbone on left  side of chest with arrow pointing upward.  Rub patch adhesive wings for 2 minutes. Remove white label marked "1". Remove the white  label marked "2". Rub patch adhesive wings for 2 additional minutes.  While looking in a mirror, press and release button in center of patch. A small green light will  flash 3-4 times. This will be your only indicator that the monitor has been turned on.  Do not shower for the first 24 hours. You may shower after the first 24 hours.  Press the button if you feel a symptom. You will hear a small click. Record Date, Time and  Symptom in the Patient Logbook.  When you are ready to remove the patch, follow instructions on the last 2 pages of Patient  Logbook. Stick patch monitor onto the last page of Patient Logbook.  Place Patient Logbook in the blue and  white box. Use locking tab on box and tape box closed  securely. The blue and white box has prepaid postage on it. Please place it in the mailbox as  soon as possible. Your physician should have your test results approximately 7 days after the  monitor has been mailed back to [facility].  Call [facility] at [phone] if you have questions regarding  your ZIO XT patch monitor. Call them immediately if you see an orange light blinking on your  monitor.  If your monitor falls off in less than 4  days, contact our Monitor department at [phone].  If your monitor becomes loose or falls off after 4 days call [employer] at [phone] for  suggestions on securing your monitor   Follow-Up: At [facility], you and your health needs are our priority.  As part of our continuing mission to provide you with exceptional heart care, we have created designated Provider Care Teams.  These Care Teams include your primary Cardiologist (physician) and Advanced Practice Providers (APPs -  Physician Assistants and Nurse Practitioners) who all work together to provide you with the care you need, when you need it.   Your next appointment:   2-3 month(s)  The format for your next appointment:   In Person  Provider:   Sanda Klein, MD     Other Instructions   Important Information About [place]

## 2022-04-04 NOTE — Progress Notes (Signed)
Cardiology office note   Date:  04/04/2022   ID:  Monica [name], DOB [birth year], MRN [id number]   PCP:  Cassandria Anger, MD  Cardiologist:  Sanda Klein, MD  Electrophysiologist:  Vickie Epley, MD   Evaluation Performed:  Follow-Up Visit  Chief Complaint: Atrial fibrillation  History of Present Illness:    Monica [name] is a 76 y.o. female with palpitations related to episodes of paroxysmal atrial fibrillation as well as PACs and PVCs, without significant underlying structural heart disease.  Over the last year she has had increasingly frequent palpitations.  The atrial fibrillation now happens every week or 2, with episodes that usually last for up to 1.5 hours rarely up to 4 hours.  She feels a little bit unwell but denies significant dyspnea or presyncope with the episodes.  She does not have chest pain.  She routinely takes a dose of fast acting with Toprol tartrate when this happens, but sometimes the palpitations resolved before she has a chance to take it.  It has been roughly 2 years since her husband passed away and there is a clear increase in frequency of arrhythmia since then.  She actually went into atrial fibrillation during her office visit today.  Her initial ECG showed sinus rhythm at 93 bpm but when I examined her the rhythm was irregular.  Repeat ECG showed atrial fibrillation with rapid ventricular response at 123 bpm.  She had a follow-up echocardiogram in September which showed normal left ventricular systolic function and normal size left atrium, no significant valve problems.  She has developed rheumatoid arthritis.  Started treatment with methotrexate.  Now also takes Cimzia which seems to be helping (Dr. Trudie Reed).  Was on prednisone over the summer but has weaned off this.  Other comorbid conditions include bronchiectasis, gastroesophageal reflux disease, irritable bowel syndrome, osteoporosis, chronic sinusitis and mild anxiety disorder.  We  talked about antiarrhythmic medications today.  She told me that since she is living alone now she is worried about developing side effects and there being nobody else there.  She became a little emotional and teary when she said this.   Past Medical History:  Diagnosis Date   Adenomatous colon polyp 10/2003   Allergic rhinitis    Allergy    SEASONAL   Bronchiectasis    Chronic sinusitis    Diverticulosis    GERD (gastroesophageal reflux disease)    Hearing loss    Hemorrhoids    Insomnia    Menopausal disorder    Osteoporosis    Palpitations    Paroxysmal A-fib (HCC)    Rheumatoid arthritis (HCC)    TMJ syndrome    Urticaria    Past Surgical History:  Procedure Laterality Date   COLONOSCOPY     NASAL SINUS SURGERY     TYMPANOSTOMY     VIDEO BRONCHOSCOPY Bilateral 07/16/2016   Procedure: VIDEO BRONCHOSCOPY WITH FLUORO;  Surgeon: Collene Gobble, MD;  Location: [facility];  Service: Cardiopulmonary;  Laterality: Bilateral;     Current Meds  Medication Sig   acetaminophen (TYLENOL) 500 MG tablet Take 500 mg by mouth every 6 (six) hours as needed for moderate pain or headache.   CALCIUM-MAGNESIUM-ZINC PO Take 500 mg by mouth daily.   certolizumab pegol (CIMZIA) 2 X 200 MG KIT Per Rheumatology (Patient taking differently: every 30 (thirty) days. Per Rheumatology)   Cholecalciferol (VITAMIN D) 1000 UNITS capsule Take 1,000 Units by mouth daily.   dextromethorphan (DELSYM) 30 MG/5ML liquid Take  30 mg by mouth at bedtime as needed for cough.    ELIQUIS 5 MG TABS tablet TAKE 1 TABLET TWICE A DAY   Estradiol 10 MCG TABS Place 10 mcg vaginally 3 (three) times a week.   famotidine (PEPCID AC MAXIMUM STRENGTH) 20 MG tablet 2 tablets at bedtime as needed Orally Once a day   fluticasone (FLONASE ALLERGY RELIEF) 50 MCG/ACT nasal spray    folic acid (FOLVITE) 1 MG tablet Take 1 mg by mouth daily. Patient takes 2 capsules daily   guaiFENesin (MUCINEX) 600 MG 12 hr tablet Take 1 tablet  every 12 hours by oral route.   Lactobacillus Rhamnosus, GG, (CULTURELLE PO) Take 1 tablet by mouth daily.   loratadine (CLARITIN) 10 MG tablet Take 10 mg by mouth daily.   methotrexate (RHEUMATREX) 2.5 MG tablet Take 10 mg by mouth once a week.   metoprolol succinate (TOPROL XL) 25 MG 24 hr tablet Take 1.5 tablets (37.5 mg total) by mouth in the morning and at bedtime.   metoprolol tartrate (LOPRESSOR) 25 MG tablet Take 0.5 tablets (12.5 mg total) by mouth daily as needed.   Multiple Vitamins-Minerals (CENTRUM SILVER) tablet See admin instructions.   Respiratory Therapy Supplies (FLUTTER) DEVI Twice a day and prn as needed, may increase if feeling worse   traMADol (ULTRAM) 50 MG tablet Take 1 tablet by mouth every 8 (eight) hours as needed.   Zoledronic Acid (RECLAST IV) Inject into the vein. IV yearly     Allergies:   Amoxicillin, Moxifloxacin, Nitrofurantoin, Albuterol, Amoxicillin-pot clavulanate, Avelox [moxifloxacin hcl in nacl], Benzonatate, Ciprofloxacin, Macrobid [nitrofurantoin macrocrystal], and Sulfamethoxazole-trimethoprim   Social History   Tobacco Use   Smoking status: Never   Smokeless tobacco: Never  Vaping Use   Vaping Use: Never used  Substance Use Topics   Alcohol use: Yes    Alcohol/week: 1.0 - 2.0 standard drink of alcohol    Types: 1 - 2 Glasses of wine per week    Comment: 1-2 glasses of wine a month 05/29/21   Drug use: No     Family Hx: The patient's family history includes AVM in her brother; Cancer in her maternal grandmother, mother, and paternal uncle; Colon cancer (age of onset: 74) in her mother; Heart disease in her father and maternal grandfather; Hypertension in her father; Rectal cancer in her mother; Seizures in her mother. There is no history of Esophageal cancer.  ROS:   Please see the history of present illness.     All other systems reviewed and are negative.   Prior CV studies:   The following studies were reviewed today: Event monitor  January 2020 showing very brief atrial fibrillation and frequent PACs Echo 2017 shows normal left ventricular systolic function, no valvular abnormalities, normal left atrial size  Echocardiogram 01/29/2022     1. Left ventricular ejection fraction, by estimation, is 60 to 65%. The  left ventricle has normal function. The left ventricle has no regional  wall motion abnormalities. Left ventricular diastolic parameters are  consistent with Grade I diastolic  dysfunction (impaired relaxation).   2. Right ventricular systolic function is normal. The right ventricular  size is normal.   3. The mitral valve is normal in structure. No evidence of mitral valve  regurgitation. No evidence of mitral stenosis.   4. The aortic valve is normal in structure. Aortic valve regurgitation is  trivial. No aortic stenosis is present.   5. The inferior vena cava is normal in size with greater than 50%  respiratory variability, suggesting right atrial pressure of 3 mmHg.   Labs/Other Tests and Data Reviewed:    EKG: Ordered today and personally reviewed is unchanged from previous tracings, showing normal sinus rhythm, left atrial abnormality, nonspecific T wave changes, normal QTC 444 ms.  Recent Labs: 05/24/2021: Magnesium 2.4 05/30/2021: TSH 3.09 09/12/2021: ALT 13; BUN 17; Creatinine, Ser 0.76; Hemoglobin 12.2; Platelets 453; Potassium 3.8; Sodium 135   Recent Lipid Panel Lab Results  Component Value Date/Time   CHOL 140 07/06/2018 07:55 AM   TRIG 41.0 07/06/2018 07:55 AM   HDL 62.80 07/06/2018 07:55 AM   CHOLHDL 2 07/06/2018 07:55 AM   LDLCALC 69 07/06/2018 07:55 AM    Wt Readings from Last 3 Encounters:  04/04/22 47 kg  03/27/22 47.6 kg  03/20/22 46.9 kg     Objective:    Vital Signs:  BP 110/60 (BP Location: Left Arm, Patient Position: Sitting, Cuff Size: Small)   Pulse 93   Ht 5' 1.5" (1.562 m)   Wt 47 kg   SpO2 96%   BMI 19.26 kg/m     General: Alert, oriented x3, no distress,  small body habitus and very lean Head: no evidence of trauma, PERRL, EOMI, no exophtalmos or lid lag, no myxedema, no xanthelasma; normal ears, nose and oropharynx Neck: normal jugular venous pulsations and no hepatojugular reflux; brisk carotid pulses without delay and no carotid bruits Chest: clear to auscultation, no signs of consolidation by percussion or palpation, normal fremitus, symmetrical and full respiratory excursions Cardiovascular: normal position and quality of the apical impulse, regular rhythm, normal first and second heart sounds, no murmurs, rubs or gallops Abdomen: no tenderness or distention, no masses by palpation, no abnormal pulsatility or arterial bruits, normal bowel sounds, no hepatosplenomegaly Extremities: no clubbing, cyanosis or edema; 2+ radial, ulnar and brachial pulses bilaterally; 2+ right femoral, posterior tibial and dorsalis pedis pulses; 2+ left femoral, posterior tibial and dorsalis pedis pulses; no subclavian or femoral bruits Neurological: grossly nonfocal Psych: Normal mood and affect    ASSESSMENT & PLAN:    1. Paroxysmal atrial fibrillation (HCC)   2. Acquired thrombophilia (HCC)       AFib: The episodes are increasingly frequent now occurring every 1-2 weeks, but remain relatively brief (1-1.5 hours) and only mildly symptomatic.  On appropriate anticoagulation.  Increase the metoprolol to 50 mg twice daily for better rate control, can still take the additional metoprolol tartrate on top of this as needed.  We have already reviewed the pros and cons of true antiarrhythmics.  She could qualify for either flecainide or Multaq, probably with good results since she does not have significant structural heart disease.  Finally talked about the fact that ablation has now moved to a more frontline role for relief of symptoms related to atrial fibrillation.  I think she would be a better candidate for atrial fibrillation ablation (her small body habitus but is  at high risk of complication with antiarrhythmics, the absence of structural heart disease and normal size left atrium predicts a good response to ablation).  Will get another arrhythmia monitor to get a better idea of the prevalence of atrial fibrillation and then discuss options again. Anticoagulation: Well-tolerated, no bleeding complications. RA: Currently on methotrexate and Cimzia, was on prednisone over the summer.  There are no Patient Instructions on file for this visit.   Signed, Sanda Klein, MD  04/04/2022 1:34 PM    [facility]

## 2022-04-08 DIAGNOSIS — M25512 Pain in left shoulder: Secondary | ICD-10-CM | POA: Diagnosis not present

## 2022-04-08 DIAGNOSIS — M25571 Pain in right ankle and joints of right foot: Secondary | ICD-10-CM | POA: Diagnosis not present

## 2022-04-09 DIAGNOSIS — M25512 Pain in left shoulder: Secondary | ICD-10-CM | POA: Diagnosis not present

## 2022-04-09 DIAGNOSIS — Z79899 Other long term (current) drug therapy: Secondary | ICD-10-CM | POA: Diagnosis not present

## 2022-04-09 DIAGNOSIS — Z681 Body mass index (BMI) 19 or less, adult: Secondary | ICD-10-CM | POA: Diagnosis not present

## 2022-04-09 DIAGNOSIS — M25571 Pain in right ankle and joints of right foot: Secondary | ICD-10-CM | POA: Diagnosis not present

## 2022-04-09 DIAGNOSIS — M0579 Rheumatoid arthritis with rheumatoid factor of multiple sites without organ or systems involvement: Secondary | ICD-10-CM | POA: Diagnosis not present

## 2022-04-09 DIAGNOSIS — M1991 Primary osteoarthritis, unspecified site: Secondary | ICD-10-CM | POA: Diagnosis not present

## 2022-04-10 DIAGNOSIS — M0579 Rheumatoid arthritis with rheumatoid factor of multiple sites without organ or systems involvement: Secondary | ICD-10-CM | POA: Diagnosis not present

## 2022-04-16 ENCOUNTER — Ambulatory Visit: Payer: TRICARE For Life (TFL) | Admitting: Emergency Medicine

## 2022-04-17 ENCOUNTER — Ambulatory Visit: Payer: TRICARE For Life (TFL) | Attending: Cardiovascular Disease

## 2022-04-17 ENCOUNTER — Encounter: Payer: Self-pay | Admitting: Cardiovascular Disease

## 2022-04-17 DIAGNOSIS — I480 Paroxysmal atrial fibrillation: Secondary | ICD-10-CM

## 2022-04-17 NOTE — Progress Notes (Unsigned)
Enrolled patient for a 7 day Zio XT monitor to be mailed to patients home.  

## 2022-04-18 DIAGNOSIS — I480 Paroxysmal atrial fibrillation: Secondary | ICD-10-CM

## 2022-04-24 ENCOUNTER — Encounter: Payer: Self-pay | Admitting: Cardiovascular Disease

## 2022-04-24 MED ORDER — METOPROLOL SUCCINATE ER 25 MG PO TB24: 37.5000 mg | ORAL_TABLET | Freq: Two times a day (BID) | ORAL | 3 refills | Status: DC

## 2022-04-24 MED ORDER — METOPROLOL SUCCINATE ER 25 MG PO TB24: 37.5000 mg | ORAL_TABLET | Freq: Two times a day (BID) | ORAL | Status: DC

## 2022-04-24 NOTE — Addendum Note (Signed)
Addended by: Patria Mane A on: 04/24/2022 10:31 AM   Modules accepted: Orders

## 2022-04-24 NOTE — Telephone Encounter (Signed)
Please go ahead and reduce the metoprolol succinate back to the previous dose of 37.5 mg twice daily. Let's wait for monitor results before any other changes, please.

## 2022-05-01 DIAGNOSIS — I480 Paroxysmal atrial fibrillation: Secondary | ICD-10-CM | POA: Diagnosis not present

## 2022-05-02 ENCOUNTER — Encounter: Payer: Self-pay | Admitting: Cardiovascular Disease

## 2022-05-06 ENCOUNTER — Other Ambulatory Visit: Payer: Self-pay | Admitting: Cardiovascular Disease

## 2022-05-06 DIAGNOSIS — I480 Paroxysmal atrial fibrillation: Secondary | ICD-10-CM

## 2022-05-09 DIAGNOSIS — M0579 Rheumatoid arthritis with rheumatoid factor of multiple sites without organ or systems involvement: Secondary | ICD-10-CM | POA: Diagnosis not present

## 2022-05-24 ENCOUNTER — Telehealth: Payer: Self-pay | Admitting: *Deleted

## 2022-05-24 NOTE — Patient Outreach (Signed)
  Care Coordination   05/24/2022 Name: Monica [name] MRN: [id number] DOB: February 18, 1946   Care Coordination Outreach Attempts:  An unsuccessful telephone outreach was attempted today to offer the patient information about available care coordination services as a benefit of their health plan.   Follow Up Plan:  Additional outreach attempts will be made to offer the patient care coordination information and services.   Encounter Outcome:  No Answer   Care Coordination Interventions:  No, not indicated    Raina Mina, RN Care Management Coordinator [place] Office [phone]

## 2022-05-30 DIAGNOSIS — Z9622 Myringotomy tube(s) status: Secondary | ICD-10-CM | POA: Diagnosis not present

## 2022-06-10 DIAGNOSIS — Z79899 Other long term (current) drug therapy: Secondary | ICD-10-CM | POA: Diagnosis not present

## 2022-06-10 DIAGNOSIS — Z111 Encounter for screening for respiratory tuberculosis: Secondary | ICD-10-CM | POA: Diagnosis not present

## 2022-06-10 DIAGNOSIS — R5383 Other fatigue: Secondary | ICD-10-CM | POA: Diagnosis not present

## 2022-06-10 DIAGNOSIS — M0579 Rheumatoid arthritis with rheumatoid factor of multiple sites without organ or systems involvement: Secondary | ICD-10-CM | POA: Diagnosis not present

## 2022-06-13 NOTE — Progress Notes (Deleted)
Electrophysiology Office Follow up Visit Note:    Date:  06/13/2022   ID:  Monica [name], DOB [birth year], MRN [id number]  PCP:  Cassandria Anger, MD  [facility] Cardiologist:  Sanda Klein, MD  [facility] Electrophysiologist:  Vickie Epley, MD    Interval History:    Monica [name] is a 77 y.o. female who presents for a follow up visit. They were last seen in clinic 06/19/2021 for pAF. At that appointment we discussed PVI and thought that would be the best long term option. Unfortunately she was on high dose steroids at that visit and we wanted to wait until she was off the steroids to minimize periprocedural risk.   Since that visit she has started MTX and has been able to discontinue prednisone. She continues to take eliquis for stroke ppx. She presents to reconsider PVI.      Past Medical History:  Diagnosis Date   Adenomatous colon polyp 10/2003   Allergic rhinitis    Allergy    SEASONAL   Bronchiectasis    Chronic sinusitis    Diverticulosis    GERD (gastroesophageal reflux disease)    Hearing loss    Hemorrhoids    Insomnia    Menopausal disorder    Osteoporosis    Palpitations    Paroxysmal A-fib (HCC)    Rheumatoid arthritis (HCC)    TMJ syndrome    Urticaria     Past Surgical History:  Procedure Laterality Date   COLONOSCOPY     NASAL SINUS SURGERY     TYMPANOSTOMY     VIDEO BRONCHOSCOPY Bilateral 07/16/2016   Procedure: VIDEO BRONCHOSCOPY WITH FLUORO;  Surgeon: Collene Gobble, MD;  Location: [facility];  Service: Cardiopulmonary;  Laterality: Bilateral;    Current Medications: No outpatient medications have been marked as taking for the 06/14/22 encounter (Appointment) with Vickie Epley, MD.     Allergies:   Amoxicillin, Moxifloxacin, Nitrofurantoin, Albuterol, Amoxicillin-pot clavulanate, Avelox [moxifloxacin hcl in nacl], Benzonatate, Ciprofloxacin, Macrobid [nitrofurantoin macrocrystal], and Sulfamethoxazole-trimethoprim    Social History   Socioeconomic History   Marital status: Widowed    Spouse name: [name]   Number of children: 0   Years of education: BSN   Highest education level: Not on file  Occupational History   Occupation: retired Therapist, sports  Tobacco Use   Smoking status: Never   Smokeless tobacco: Never  Vaping Use   Vaping Use: Never used  Substance and Sexual Activity   Alcohol use: Yes    Alcohol/week: 1.0 - 2.0 standard drink of alcohol    Types: 1 - 2 Glasses of wine per week    Comment: 1-2 glasses of wine a month 05/29/21   Drug use: No   Sexual activity: Not Currently  Other Topics Concern   Not on file  Social History Narrative   ** Merged History Encounter **       Lives with husband Caffeine use: 1 cup tea per day   Social Determinants of Health   Financial Resource Strain: Low Risk  (07/17/2021)   Overall Financial Resource Strain (CARDIA)    Difficulty of Paying Living Expenses: Not hard at all  Food Insecurity: No Food Insecurity (07/17/2021)   Hunger Vital Sign    Worried About Running Out of Food in the Last Year: Never true    Ran Out of Food in the Last Year: Never true  Transportation Needs: No Transportation Needs (07/17/2021)   PRAPARE - Transportation    Lack  of Transportation (Medical): No    Lack of Transportation (Non-Medical): No  Physical Activity: Insufficiently Active (07/17/2021)   Exercise Vital Sign    Days of Exercise per Week: 7 days    Minutes of Exercise per Session: 20 min  Stress: No Stress Concern Present (07/17/2021)   [place]    Feeling of Stress : Not at all  Social Connections: Unknown (07/17/2021)   Social Connection and Isolation Panel [NHANES]    Frequency of Communication with Friends and Family: More than three times a week    Frequency of Social Gatherings with Friends and Family: Once a week    Attends Religious Services: Patient refused    Active Member of Clubs or  Organizations: Patient refused    Attends Archivist Meetings: Patient refused    Marital Status: Widowed     Family History: The patient's family history includes AVM in her brother; Cancer in her maternal grandmother, mother, and paternal uncle; Colon cancer (age of onset: 40) in her mother; Heart disease in her father and maternal grandfather; Hypertension in her father; Rectal cancer in her mother; Seizures in her mother. There is no history of Esophageal cancer.  ROS:   Please see the history of present illness.    All other systems reviewed and are negative.  EKGs/Labs/Other Studies Reviewed:    The following studies were reviewed today:  05/01/2022 Zio AF burden 3%   Recent Labs: 09/12/2021: ALT 13; BUN 17; Creatinine, Ser 0.76; Hemoglobin 12.2; Platelets 453; Potassium 3.8; Sodium 135  Recent Lipid Panel    Component Value Date/Time   CHOL 140 07/06/2018 0755   TRIG 41.0 07/06/2018 0755   HDL 62.80 07/06/2018 0755   CHOLHDL 2 07/06/2018 0755   VLDL 8.2 07/06/2018 0755   LDLCALC 69 07/06/2018 0755    Physical Exam:    VS:  There were no vitals taken for this visit.    Wt Readings from Last 3 Encounters:  04/04/22 103 lb 9.6 oz (47 kg)  03/27/22 105 lb (47.6 kg)  03/20/22 103 lb 6.4 oz (46.9 kg)     GEN: *** Well nourished, well developed in no acute distress CARDIAC: ***RRR, no murmurs, rubs, gallops        ASSESSMENT:    No diagnosis found. PLAN:    In order of problems listed above:  #pAF Continue eliquis.  Discussed treatment options today for their AF including antiarrhythmic drug therapy and ablation. Discussed risks, recovery and likelihood of success. Discussed potential need for repeat ablation procedures and antiarrhythmic drugs after an initial ablation. They wish to proceed with scheduling.  Risk, benefits, and alternatives to EP study and radiofrequency ablation for afib were also discussed in detail today. These risks include  but are not limited to stroke, bleeding, vascular damage, tamponade, perforation, damage to the esophagus, lungs, and other structures, pulmonary vein stenosis, worsening renal function, and death. The patient understands these risk and wishes to proceed.  We will therefore proceed with catheter ablation at the next available time.  Carto, ICE, anesthesia are requested for the procedure.  Will also obtain CT PV protocol prior to the procedure to exclude LAA thrombus and further evaluate atrial anatomy.     Medication Adjustments/Labs and Tests Ordered: Current medicines are reviewed at length with the patient today.  Concerns regarding medicines are outlined above.  No orders of the defined types were placed in this encounter.  No orders of the defined  types were placed in this encounter.    Signed, Lars Mage, MD, [facility], [facility] 06/13/2022 9:17 PM    Electrophysiology [facility]

## 2022-06-14 ENCOUNTER — Ambulatory Visit: Payer: Medicare Other | Attending: Cardiology | Admitting: Cardiology

## 2022-06-14 ENCOUNTER — Encounter: Payer: Self-pay | Admitting: Cardiology

## 2022-06-14 VITALS — BP 108/62 | HR 82 | Ht 61.5 in | Wt 100.2 lb

## 2022-06-14 DIAGNOSIS — I480 Paroxysmal atrial fibrillation: Secondary | ICD-10-CM | POA: Insufficient documentation

## 2022-06-14 NOTE — Patient Instructions (Addendum)
Medication Instructions:  Your physician recommends that you continue on your current medications as directed. Please refer to the Current Medication list given to you today.  *If you need a refill on your cardiac medications before your next appointment, please call your pharmacy*  Lab Work: BMET and CBC - May 17th at [address] between 7:15am and 5:00pm If you have labs (blood work) drawn today and your tests are completely normal, you will receive your results only by: MyChart Message (if you have MyChart) OR A paper copy in the mail If you have any lab test that is abnormal or we need to change your treatment, we will call you to review the results.   Testing/Procedures: Your physician has requested that you have cardiac CT. Cardiac computed tomography (CT) is a painless test that uses an x-ray machine to take clear, detailed pictures of your heart. Please follow instruction sheet as given. We will call you to schedule.   Your physician has recommended that you have an ablation. Catheter ablation is a medical procedure used to treat some cardiac arrhythmias (irregular heartbeats). During catheter ablation, a long, thin, flexible tube is put into a blood vessel in your groin (upper thigh), or neck. This tube is called an ablation catheter. It is then guided to your heart through the blood vessel. Radio frequency waves destroy small areas of heart tissue where abnormal heartbeats may cause an arrhythmia to start. Please see the instruction sheet given to you today. You are scheduled for June 6th at 10:30am. You will need to arrive at [facility], Main Entrance A at 8:30am.    Follow-Up: At [facility], you and your health needs are our priority.  As part of our continuing mission to provide you with exceptional heart care, we have created designated Provider Care Teams.  These Care Teams include your primary Cardiologist (physician) and Advanced Practice  Providers (APPs -  Physician Assistants and Nurse Practitioners) who all work together to provide you with the care you need, when you need it.  Your next appointment:   We will call you to arrange follow up appointments

## 2022-06-14 NOTE — Progress Notes (Signed)
Electrophysiology Office Follow up Visit Note:    Date:  06/14/2022   ID:  Monica [name], DOB 11/24/1945, MRN [id number]  PCP:  Cassandria Anger, MD  [facility] Cardiologist:  Sanda Klein, MD  [facility] Electrophysiologist:  Vickie Epley, MD    Interval History:    Monica [name] is a 77 y.o. female who presents for a follow up visit. They were last seen in clinic 06/19/2021 for pAF. At that appointment we discussed PVI and thought that would be the best long term option. Unfortunately she was on high dose steroids at that visit and we wanted to wait until she was off the steroids to minimize periprocedural risk.   Since that visit she has started MTX and has been able to discontinue prednisone. She continues to take eliquis for stroke ppx. She presents to reconsider PVI.   Today, she endorses palpitations when in A fib and when having PVCs. She has bene using her Kardia monitor when she has these episodes.   She confirmed stopping steroids. She has been taking Cimzia 2 270m injections every month with rheumatology.   She is receptive to considering ablation.   She reports she had Covid last year and continues to have a lingering cough.   She denies any chest pain, shortness of breath, or peripheral edema. No lightheadedness, headaches, syncope, orthopnea, or PND.  Past Medical History:  Diagnosis Date   Adenomatous colon polyp 10/2003   Allergic rhinitis    Allergy    SEASONAL   Bronchiectasis    Chronic sinusitis    Diverticulosis    GERD (gastroesophageal reflux disease)    Hearing loss    Hemorrhoids    Insomnia    Menopausal disorder    Osteoporosis    Palpitations    Paroxysmal A-fib (HCC)    Rheumatoid arthritis (HCC)    TMJ syndrome    Urticaria     Past Surgical History:  Procedure Laterality Date   COLONOSCOPY     NASAL SINUS SURGERY     TYMPANOSTOMY     VIDEO BRONCHOSCOPY Bilateral 07/16/2016   Procedure: VIDEO BRONCHOSCOPY WITH  FLUORO;  Surgeon: RCollene Gobble MD;  Location: [facility];  Service: Cardiopulmonary;  Laterality: Bilateral;    Current Medications: Current Meds  Medication Sig   acetaminophen (TYLENOL) 500 MG tablet Take 500 mg by mouth every 6 (six) hours as needed for moderate pain or headache.   apixaban (ELIQUIS) 5 MG TABS tablet TAKE 1 TABLET TWICE A DAY   calcium carbonate (TUMS EX) 750 MG chewable tablet Chew 1,500 mg by mouth at bedtime.   CALCIUM-MAGNESIUM-ZINC PO Take 500 mg by mouth daily.   certolizumab pegol (CIMZIA) 2 X 200 MG KIT Per Rheumatology (Patient taking differently: every 30 (thirty) days. Per Rheumatology)   Cholecalciferol (VITAMIN D) 1000 UNITS capsule Take 1,000 Units by mouth daily.   dextromethorphan (DELSYM) 30 MG/5ML liquid Take 30 mg by mouth at bedtime as needed for cough.    Estradiol 10 MCG TABS Place 10 mcg vaginally 3 (three) times a week.   famotidine (PEPCID AC MAXIMUM STRENGTH) 20 MG tablet 2 tablets at bedtime as needed Orally Once a day   fluticasone (FLONASE ALLERGY RELIEF) 50 MCG/ACT nasal spray    folic acid (FOLVITE) 1 MG tablet Take 1 mg by mouth daily. Patient takes 2 capsules daily   guaiFENesin (MUCINEX) 600 MG 12 hr tablet Take 1 tablet every 12 hours by oral route.   Lactobacillus  Rhamnosus, GG, (CULTURELLE PO) Take 1 tablet by mouth daily.   loratadine (CLARITIN) 10 MG tablet Take 10 mg by mouth daily.   methotrexate (RHEUMATREX) 2.5 MG tablet Take 10 mg by mouth once a week.   metoprolol succinate (TOPROL XL) 25 MG 24 hr tablet Take 1.5 tablets (37.5 mg total) by mouth in the morning and at bedtime.   metoprolol tartrate (LOPRESSOR) 25 MG tablet Take 0.5 tablets (12.5 mg total) by mouth daily as needed.   Multiple Vitamins-Minerals (CENTRUM SILVER) tablet See admin instructions.   Respiratory Therapy Supplies (FLUTTER) DEVI Twice a day and prn as needed, may increase if feeling worse   traMADol (ULTRAM) 50 MG tablet Take 1 tablet by mouth every 8  (eight) hours as needed.   Zoledronic Acid (RECLAST IV) Inject into the vein. IV yearly     Allergies:   Amoxicillin, Moxifloxacin, Nitrofurantoin, Albuterol, Amoxicillin-pot clavulanate, Avelox [moxifloxacin hcl in nacl], Benzonatate, Ciprofloxacin, Macrobid [nitrofurantoin macrocrystal], and Sulfamethoxazole-trimethoprim   Social History   Socioeconomic History   Marital status: Widowed    Spouse name: [name]   Number of children: 0   Years of education: BSN   Highest education level: Not on file  Occupational History   Occupation: retired Therapist, sports  Tobacco Use   Smoking status: Never   Smokeless tobacco: Never  Vaping Use   Vaping Use: Never used  Substance and Sexual Activity   Alcohol use: Yes    Alcohol/week: 1.0 - 2.0 standard drink of alcohol    Types: 1 - 2 Glasses of wine per week    Comment: 1-2 glasses of wine a month 05/29/21   Drug use: No   Sexual activity: Not Currently  Other Topics Concern   Not on file  Social History Narrative   ** Merged History Encounter **       Lives with husband Caffeine use: 1 cup tea per day   Social Determinants of Health   Financial Resource Strain: Low Risk  (07/17/2021)   Overall Financial Resource Strain (CARDIA)    Difficulty of Paying Living Expenses: Not hard at all  Food Insecurity: No Food Insecurity (07/17/2021)   Hunger Vital Sign    Worried About Running Out of Food in the Last Year: Never true    Ran Out of Food in the Last Year: Never true  Transportation Needs: No Transportation Needs (07/17/2021)   PRAPARE - Hydrologist (Medical): No    Lack of Transportation (Non-Medical): No  Physical Activity: Insufficiently Active (07/17/2021)   Exercise Vital Sign    Days of Exercise per Week: 7 days    Minutes of Exercise per Session: 20 min  Stress: No Stress Concern Present (07/17/2021)   [place]    Feeling of Stress : Not at  all  Social Connections: Unknown (07/17/2021)   Social Connection and Isolation Panel [NHANES]    Frequency of Communication with Friends and Family: More than three times a week    Frequency of Social Gatherings with Friends and Family: Once a week    Attends Religious Services: Patient refused    Active Member of Clubs or Organizations: Patient refused    Attends Archivist Meetings: Patient refused    Marital Status: Widowed     Family History: The patient's family history includes AVM in her brother; Cancer in her maternal grandmother, mother, and paternal uncle; Colon cancer (age of onset: 44) in her mother;  Heart disease in her father and maternal grandfather; Hypertension in her father; Rectal cancer in her mother; Seizures in her mother. There is no history of Esophageal cancer.  ROS:   Please see the history of present illness.   (+) Palpitations   All other systems reviewed and are negative.  EKGs/Labs/Other Studies Reviewed:    The following studies were reviewed today:  05/01/2022 Zio AF burden 3%   Recent Labs: 09/12/2021: ALT 13; BUN 17; Creatinine, Ser 0.76; Hemoglobin 12.2; Platelets 453; Potassium 3.8; Sodium 135  Recent Lipid Panel    Component Value Date/Time   CHOL 140 07/06/2018 0755   TRIG 41.0 07/06/2018 0755   HDL 62.80 07/06/2018 0755   CHOLHDL 2 07/06/2018 0755   VLDL 8.2 07/06/2018 0755   LDLCALC 69 07/06/2018 0755    Physical Exam:    VS:  BP 108/62   Pulse 82   Ht 5' 1.5" (1.562 m)   Wt 100 lb 3.2 oz (45.5 kg)   SpO2 97%   BMI 18.63 kg/m     Wt Readings from Last 3 Encounters:  06/14/22 100 lb 3.2 oz (45.5 kg)  04/04/22 103 lb 9.6 oz (47 kg)  03/27/22 105 lb (47.6 kg)     GEN:  Well nourished, well developed in no acute distress.  Thin CARDIAC: RRR, no murmurs, rubs, gallops        ASSESSMENT:    1. Paroxysmal atrial fibrillation (HCC)    PLAN:    In order of problems listed above:  #pAF Continue  eliquis.  Discussed treatment options today for their AF including antiarrhythmic drug therapy and ablation. Discussed risks, recovery and likelihood of success. Discussed potential need for repeat ablation procedures and antiarrhythmic drugs after an initial ablation. They wish to proceed with scheduling.  Risk, benefits, and alternatives to EP study and radiofrequency ablation for afib were also discussed in detail today. These risks include but are not limited to stroke, bleeding, vascular damage, tamponade, perforation, damage to the esophagus, lungs, and other structures, pulmonary vein stenosis, worsening renal function, and death. The patient understands these risk and wishes to proceed.  We will therefore proceed with catheter ablation at the next available time.  Carto, ICE, anesthesia are requested for the procedure.  Will also obtain CT PV protocol prior to the procedure to exclude LAA thrombus and further evaluate atrial anatomy.    Medication Adjustments/Labs and Tests Ordered: Current medicines are reviewed at length with the patient today.  Concerns regarding medicines are outlined above.  No orders of the defined types were placed in this encounter.  No orders of the defined types were placed in this encounter.  I,Rachel Rivera,acting as a scribe for Vickie Epley, MD.,have documented all relevant documentation on the behalf of Vickie Epley, MD,as directed by  Vickie Epley, MD while in the presence of Vickie Epley, MD.  I, Vickie Epley, MD, have reviewed all documentation for this visit. The documentation on 06/14/22 for the exam, diagnosis, procedures, and orders are all accurate and complete.  Signed, Lars Mage, MD, [id number] [facility], [facility] 06/14/2022 9:49 AM    Electrophysiology [facility]

## 2022-06-25 DIAGNOSIS — Z9622 Myringotomy tube(s) status: Secondary | ICD-10-CM | POA: Diagnosis not present

## 2022-07-08 ENCOUNTER — Other Ambulatory Visit: Payer: Self-pay | Admitting: *Deleted

## 2022-07-08 ENCOUNTER — Encounter: Payer: Self-pay | Admitting: Family Medicine

## 2022-07-08 ENCOUNTER — Telehealth: Payer: Self-pay

## 2022-07-08 ENCOUNTER — Ambulatory Visit (INDEPENDENT_AMBULATORY_CARE_PROVIDER_SITE_OTHER): Payer: Medicare Other | Admitting: Family Medicine

## 2022-07-08 VITALS — BP 118/68 | HR 68 | Temp 97.6°F | Resp 22 | Ht 61.5 in | Wt 100.0 lb

## 2022-07-08 DIAGNOSIS — J069 Acute upper respiratory infection, unspecified: Secondary | ICD-10-CM

## 2022-07-08 DIAGNOSIS — R6889 Other general symptoms and signs: Secondary | ICD-10-CM

## 2022-07-08 LAB — POCT INFLUENZA A/B
Influenza A, POC: NEGATIVE
Influenza B, POC: NEGATIVE

## 2022-07-08 NOTE — Addendum Note (Signed)
Addended by: Loman Brooklyn on: 07/08/2022 09:51 AM   Modules accepted: Level of Service

## 2022-07-08 NOTE — Addendum Note (Signed)
Addended by: Loman Brooklyn on: 07/08/2022 09:47 AM   Modules accepted: Level of Service

## 2022-07-08 NOTE — Telephone Encounter (Signed)
Contacted Monica [name] to schedule their annual wellness visit. Appointment made for 07/23/22.  Norton Blizzard, [place] ([facility])  [place] [facility] [phone]

## 2022-07-08 NOTE — Progress Notes (Signed)
Assessment & Plan:  1. Viral URI Education provided on viral URIs. Discussed typical duration and progression of viral URIs.  Encouraged symptom management including cough syrup (Delsym), Tylenol, Coricidin HBP, Vicks, and a humidifier at night.   2. Flu-like symptoms - POCT Influenza A/B - negative  No results found for any visits on 07/08/22.  Follow up plan: Return if symptoms worsen or fail to improve.  Hendricks Limes, MSN, APRN, FNP-C  Subjective:  HPI: Monica [name] is a 77 y.o. female presenting on 07/08/2022 for flu like (Cough, congestion, nausea, low grade fever x nights, sinus pressure and HA )  Patient complains of cough, headache, facial pain/pressure, fever, and nausea. Max temp 100.4 last night which comes down with Tylenol. She denies head/chest congestion, sore throat, postnasal drainage, shortness of breath, and wheezing. Onset of symptoms was 2 days ago, unchanged since that time. She is drinking plenty of fluids. Evaluation to date: home COVID test negative. Treatment to date: antihistamines, cough suppressants, nasal steroids, and Mucinex, and Tylenol . She does not smoke.    ROS: Negative unless specifically indicated above in HPI.   Relevant past medical history reviewed and updated as indicated.   Allergies and medications reviewed and updated.   Current Outpatient Medications:  .  acetaminophen (TYLENOL) 500 MG tablet, Take 500 mg by mouth every 6 (six) hours as needed for moderate pain or headache., Disp: , Rfl:  .  apixaban (ELIQUIS) 5 MG TABS tablet, TAKE 1 TABLET TWICE A DAY, Disp: 180 tablet, Rfl: 1 .  calcium carbonate (TUMS EX) 750 MG chewable tablet, Chew 1,500 mg by mouth at bedtime., Disp: , Rfl:  .  certolizumab pegol (CIMZIA) 2 X 200 MG KIT, Per Rheumatology (Patient taking differently: every 30 (thirty) days. Per Rheumatology), Disp: 1 kit, Rfl: 1 .  Cholecalciferol (VITAMIN D) 1000 UNITS capsule, Take 1,000 Units by mouth daily., Disp: , Rfl:   .  dextromethorphan (DELSYM) 30 MG/5ML liquid, Take 30 mg by mouth at bedtime as needed for cough. , Disp: , Rfl:  .  Estradiol 10 MCG TABS, Place 10 mcg vaginally 3 (three) times a week., Disp: , Rfl:  .  famotidine (PEPCID AC MAXIMUM STRENGTH) 20 MG tablet, 2 tablets at bedtime as needed Orally Once a day, Disp: , Rfl:  .  fluticasone (FLONASE ALLERGY RELIEF) 50 MCG/ACT nasal spray, , Disp: , Rfl:  .  folic acid (FOLVITE) 1 MG tablet, Take 1 mg by mouth daily. Patient takes 2 capsules daily, Disp: , Rfl:  .  guaiFENesin (MUCINEX) 600 MG 12 hr tablet, Take 1 tablet every 12 hours by oral route., Disp: , Rfl:  .  Lactobacillus Rhamnosus, GG, (CULTURELLE PO), Take 1 tablet by mouth daily., Disp: , Rfl:  .  loratadine (CLARITIN) 10 MG tablet, Take 10 mg by mouth daily., Disp: , Rfl:  .  methotrexate (RHEUMATREX) 2.5 MG tablet, Take 10 mg by mouth once a week., Disp: , Rfl:  .  metoprolol succinate (TOPROL XL) 25 MG 24 hr tablet, Take 1.5 tablets (37.5 mg total) by mouth in the morning and at bedtime., Disp: 270 tablet, Rfl: 3 .  metoprolol tartrate (LOPRESSOR) 25 MG tablet, Take 0.5 tablets (12.5 mg total) by mouth daily as needed., Disp: 90 tablet, Rfl: 3 .  Multiple Vitamins-Minerals (CENTRUM SILVER) tablet, See admin instructions., Disp: , Rfl:  .  Respiratory Therapy Supplies (FLUTTER) DEVI, Twice a day and prn as needed, may increase if feeling worse, Disp: 1 each, Rfl:  0 .  Zoledronic Acid (RECLAST IV), Inject into the vein. IV yearly, Disp: , Rfl:   Allergies  Allergen Reactions  . Amoxicillin Other (See Comments)  . Moxifloxacin Other (See Comments)  . Nitrofurantoin Other (See Comments)  . Albuterol Other (See Comments)    Patient states put her into atrial fib last time she took this medication  . Amoxicillin-Pot Clavulanate Diarrhea    Has patient had a PCN reaction causing immediate rash, facial/tongue/throat swelling, SOB or lightheadedness with hypotension:No Has patient had a  PCN reaction causing severe rash involving mucus membranes or skin necrosis:No Has patient had a PCN reaction that required hospitalization:No Has patient had a PCN reaction occurring within the last 10 years:Yes If all of the above answers are "NO", then may proceed with Cephalosporin use  . Avelox [Moxifloxacin Hcl In Nacl] Nausea Only  . Benzonatate Other (See Comments)    felt bad, out of sorts, disoriented.  . Ciprofloxacin Rash and Other (See Comments)    Rash across abdomen  . Macrobid [Nitrofurantoin Macrocrystal] Diarrhea    Diarrhea, nausea  . Sulfamethoxazole-Trimethoprim Other (See Comments)    flushing    Objective:   BP 118/68   Pulse 68   Temp 97.6 F (36.4 C)   Resp (!) 22   Ht 5' 1.5" (1.562 m)   Wt 100 lb (45.4 kg)   BMI 18.59 kg/m    Physical Exam Vitals reviewed.  Constitutional:      General: She is not in acute distress.    Appearance: Normal appearance. She is not ill-appearing, toxic-appearing or diaphoretic.  HENT:     Head: Normocephalic and atraumatic.     Right Ear: Ear canal and external ear normal. There is no impacted cerumen. A PE tube is present. Tympanic membrane is scarred. Tympanic membrane is not injected, perforated or erythematous.     Left Ear: Tympanic membrane, ear canal and external ear normal. There is no impacted cerumen.     Nose: Nose normal. No congestion or rhinorrhea.     Right Sinus: No maxillary sinus tenderness or frontal sinus tenderness.     Left Sinus: No maxillary sinus tenderness or frontal sinus tenderness.     Comments: Irritation inside right nare.    Mouth/Throat:     Mouth: Mucous membranes are moist.     Pharynx: Oropharynx is clear. No oropharyngeal exudate or posterior oropharyngeal erythema.  Eyes:     General: No scleral icterus.       Right eye: No discharge.        Left eye: No discharge.     Conjunctiva/sclera: Conjunctivae normal.  Cardiovascular:     Rate and Rhythm: Normal rate and regular  rhythm.     Heart sounds: Normal heart sounds. No murmur heard.    No friction rub. No gallop.  Pulmonary:     Effort: Pulmonary effort is normal. No respiratory distress.     Breath sounds: Normal breath sounds. No stridor. No wheezing, rhonchi or rales.  Musculoskeletal:        General: Normal range of motion.     Cervical back: Normal range of motion.  Lymphadenopathy:     Cervical: No cervical adenopathy.  Skin:    General: Skin is warm and dry.     Capillary Refill: Capillary refill takes less than 2 seconds.  Neurological:     General: No focal deficit present.     Mental Status: She is alert and oriented to person, place, and time.  Mental status is at baseline.  Psychiatric:        Mood and Affect: Mood normal.        Behavior: Behavior normal.        Thought Content: Thought content normal.        Judgment: Judgment normal.

## 2022-07-08 NOTE — Patient Instructions (Signed)
Cough syrup (Delsym), Tylenol, Coricidin HBP, Vicks, and a humidifier at night.

## 2022-07-10 ENCOUNTER — Encounter: Payer: Self-pay | Admitting: Cardiovascular Disease

## 2022-07-10 ENCOUNTER — Ambulatory Visit: Payer: Medicare Other | Attending: Cardiovascular Disease | Admitting: Cardiovascular Disease

## 2022-07-10 VITALS — BP 104/64 | HR 87 | Ht 61.5 in | Wt 103.2 lb

## 2022-07-10 DIAGNOSIS — I480 Paroxysmal atrial fibrillation: Secondary | ICD-10-CM | POA: Insufficient documentation

## 2022-07-10 DIAGNOSIS — D6869 Other thrombophilia: Secondary | ICD-10-CM | POA: Insufficient documentation

## 2022-07-10 DIAGNOSIS — M0579 Rheumatoid arthritis with rheumatoid factor of multiple sites without organ or systems involvement: Secondary | ICD-10-CM

## 2022-07-10 NOTE — Progress Notes (Signed)
Cardiology office note   Date:  07/10/2022   ID:  Monica [name], DOB 12/04/1945, MRN [id number]   PCP:  Cassandria Anger, MD  Cardiologist:  Sanda Klein, MD  Electrophysiologist:  Vickie Epley, MD   Evaluation Performed:  Follow-Up Visit  Chief Complaint: Atrial fibrillation  History of Present Illness:    Monica [name] is a 77 y.o. female with palpitations related to episodes of paroxysmal atrial fibrillation as well as PACs and PVCs, without significant underlying structural heart disease.  Over the last few months the frequency of her palpitations have increased drastically, although the episodes remain relatively brief.  They usually resolve in less than an hour.  He feels that taking an additional low-dose of metoprolol to tartrate does help.  When we try to escalate the maintenance dose of metoprolol succinate she had profound fatigue that was intolerable.  She feels a little unwell during the atrial fibrillation and is worried about the rapid ventricular rates, but she has not required emergency room visits or hospitalization.  She is scheduled for A-fib ablation with Dr. Quentin Ore in early June.  She had a follow-up echocardiogram in September 2023 which showed normal left ventricular systolic function and normal size left atrium, no significant valve problems.  She has developed rheumatoid arthritis.  Started treatment with methotrexate.  Now also takes Cimzia which seems to be helping (Dr. Trudie Reed).  Currently not on steroids.  Her rheumatologist is Dr. Posey Rea at the [place] office.  Other comorbid conditions include bronchiectasis, gastroesophageal reflux disease, irritable bowel syndrome, osteoporosis, chronic sinusitis and mild anxiety disorder.    Past Medical History:  Diagnosis Date   Adenomatous colon polyp 10/2003   Allergic rhinitis    Allergy    SEASONAL   Bronchiectasis    Chronic sinusitis    Diverticulosis    GERD (gastroesophageal  reflux disease)    Hearing loss    Hemorrhoids    Insomnia    Menopausal disorder    Osteoporosis    Palpitations    Paroxysmal A-fib (HCC)    Rheumatoid arthritis (HCC)    TMJ syndrome    Urticaria    Past Surgical History:  Procedure Laterality Date   COLONOSCOPY     NASAL SINUS SURGERY     TYMPANOSTOMY     VIDEO BRONCHOSCOPY Bilateral 07/16/2016   Procedure: VIDEO BRONCHOSCOPY WITH FLUORO;  Surgeon: Collene Gobble, MD;  Location: [facility];  Service: Cardiopulmonary;  Laterality: Bilateral;     Current Meds  Medication Sig   acetaminophen (TYLENOL) 500 MG tablet Take 500 mg by mouth every 6 (six) hours as needed for moderate pain or headache.   apixaban (ELIQUIS) 5 MG TABS tablet TAKE 1 TABLET TWICE A DAY   certolizumab pegol (CIMZIA) 2 X 200 MG KIT Per Rheumatology (Patient taking differently: Inject 400 mg into the skin every 30 (thirty) days. Per Rheumatology)   Cholecalciferol (VITAMIN D) 1000 UNITS capsule Take 1,000 Units by mouth daily.   dextromethorphan (DELSYM) 30 MG/5ML liquid Take 30 mg by mouth at bedtime as needed for cough.    Estradiol 10 MCG TABS Place 10 mcg vaginally 3 (three) times a week.   famotidine (PEPCID AC MAXIMUM STRENGTH) 20 MG tablet Take 20 mg by mouth at bedtime as needed.   fluticasone (FLONASE ALLERGY RELIEF) 50 MCG/ACT nasal spray Place 1 spray into both nostrils daily as needed for allergies.   folic acid (FOLVITE) 1 MG tablet Take 1 mg by  mouth daily. Patient takes 2 capsules daily   guaiFENesin (MUCINEX) 600 MG 12 hr tablet Take 1 tablet every 12 hours by oral route.   Lactobacillus Rhamnosus, GG, (CULTURELLE PO) Take 1 tablet by mouth daily.   loratadine (CLARITIN) 10 MG tablet Take 10 mg by mouth daily.   methotrexate (RHEUMATREX) 2.5 MG tablet Take 10 mg by mouth once a week.   metoprolol succinate (TOPROL XL) 25 MG 24 hr tablet Take 1.5 tablets (37.5 mg total) by mouth in the morning and at bedtime.   metoprolol tartrate (LOPRESSOR)  25 MG tablet Take 0.5 tablets (12.5 mg total) by mouth daily as needed.   Multiple Vitamins-Minerals (CENTRUM SILVER) tablet See admin instructions.   Respiratory Therapy Supplies (FLUTTER) DEVI Twice a day and prn as needed, may increase if feeling worse     Allergies:   Amoxicillin, Moxifloxacin, Nitrofurantoin, Albuterol, Amoxicillin-pot clavulanate, Avelox [moxifloxacin hcl in nacl], Benzonatate, Ciprofloxacin, Macrobid [nitrofurantoin macrocrystal], and Sulfamethoxazole-trimethoprim   Social History   Tobacco Use   Smoking status: Never   Smokeless tobacco: Never  Vaping Use   Vaping Use: Never used  Substance Use Topics   Alcohol use: Yes    Alcohol/week: 1.0 - 2.0 standard drink of alcohol    Types: 1 - 2 Glasses of wine per week    Comment: 1-2 glasses of wine a month 05/29/21   Drug use: No     Family Hx: The patient's family history includes AVM in her brother; Cancer in her maternal grandmother, mother, and paternal uncle; Colon cancer (age of onset: 37) in her mother; Heart disease in her father and maternal grandfather; Hypertension in her father; Rectal cancer in her mother; Seizures in her mother. There is no history of Esophageal cancer.  ROS:   Please see the history of present illness.     All other systems reviewed and are negative.   Prior CV studies:   The following studies were reviewed today: Event monitor January 2020 showing very brief atrial fibrillation and frequent PACs Echo 2017 shows normal left ventricular systolic function, no valvular abnormalities, normal left atrial size  Echocardiogram 01/29/2022     1. Left ventricular ejection fraction, by estimation, is 60 to 65%. The  left ventricle has normal function. The left ventricle has no regional  wall motion abnormalities. Left ventricular diastolic parameters are  consistent with Grade I diastolic  dysfunction (impaired relaxation).   2. Right ventricular systolic function is normal. The  right ventricular  size is normal.   3. The mitral valve is normal in structure. No evidence of mitral valve  regurgitation. No evidence of mitral stenosis.   4. The aortic valve is normal in structure. Aortic valve regurgitation is  trivial. No aortic stenosis is present.   5. The inferior vena cava is normal in size with greater than 50%  respiratory variability, suggesting right atrial pressure of 3 mmHg.   Labs/Other Tests and Data Reviewed:    EKG: 04/04/2022 tracing personally reviewed shows atrial fibrillation with rapid ventricular response  Recent Labs: 09/12/2021: ALT 13; BUN 17; Creatinine, Ser 0.76; Hemoglobin 12.2; Platelets 453; Potassium 3.8; Sodium 135   Recent Lipid Panel Lab Results  Component Value Date/Time   CHOL 140 07/06/2018 07:55 AM   TRIG 41.0 07/06/2018 07:55 AM   HDL 62.80 07/06/2018 07:55 AM   CHOLHDL 2 07/06/2018 07:55 AM   LDLCALC 69 07/06/2018 07:55 AM    Wt Readings from Last 3 Encounters:  07/10/22 103 lb 3.2 oz (  46.8 kg)  07/08/22 100 lb (45.4 kg)  06/14/22 100 lb 3.2 oz (45.5 kg)     Objective:    Vital Signs:  BP 104/64 (BP Location: Left Arm, Patient Position: Sitting, Cuff Size: Small)   Pulse 87   Ht 5' 1.5" (1.562 m)   Wt 103 lb 3.2 oz (46.8 kg)   SpO2 98%   BMI 19.18 kg/m      General: Alert, oriented x3, no distress, very petite and very lean Head: no evidence of trauma, PERRL, EOMI, no exophtalmos or lid lag, no myxedema, no xanthelasma; normal ears, nose and oropharynx Neck: normal jugular venous pulsations and no hepatojugular reflux; brisk carotid pulses without delay and no carotid bruits Chest: clear to auscultation, no signs of consolidation by percussion or palpation, normal fremitus, symmetrical and full respiratory excursions Cardiovascular: normal position and quality of the apical impulse, regular rhythm, normal first and second heart sounds, no murmurs, rubs or gallops Abdomen: no tenderness or distention, no  masses by palpation, no abnormal pulsatility or arterial bruits, normal bowel sounds, no hepatosplenomegaly Extremities: no clubbing, cyanosis or edema; 2+ radial, ulnar and brachial pulses bilaterally; 2+ right femoral, posterior tibial and dorsalis pedis pulses; 2+ left femoral, posterior tibial and dorsalis pedis pulses; no subclavian or femoral bruits Neurological: grossly nonfocal Psych: Normal mood and affect     ASSESSMENT & PLAN:    1. Paroxysmal atrial fibrillation (HCC)   2. Acquired thrombophilia (HCC)   3. 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  She worries about it a lot.  She is worried about leaving her house in case she is somewhere away from home and needs a cardioversion.  Offered antiarrhythmic therapy, but she is worried about the side effects.  She is patiently waiting for her ablation procedure. Anticoagulation: Well-tolerated, no bleeding complications. RA: Currently on methotrexate and Cimzia.  Patient Instructions  Medication Instructions:  No changes *If you need a refill on your cardiac medications before your next appointment, please call your pharmacy*  Follow-Up: At [facility], you and your health needs are our priority.  As part of our continuing mission to provide you with exceptional heart care, we have created designated Provider Care Teams.  These Care Teams include your primary Cardiologist (physician) and Advanced Practice Providers (APPs -  Physician Assistants and Nurse Practitioners) who all work together to provide you with the care you need, when you need it.  We recommend signing up for the patient portal called "MyChart".  Sign up information is provided on this After Visit Summary.  MyChart is used to connect with patients for Virtual Visits  (Telemedicine).  Patients are able to view lab/test results, encounter notes, upcoming appointments, etc.  Non-urgent messages can be sent to your provider as well.   To learn more about what you can do with MyChart, go to [email or web address].    Your next appointment:   6 month(s)  Provider:   Sanda Klein, MD       Signed, Sanda Klein, MD  07/10/2022 4:07 PM    [place]

## 2022-07-10 NOTE — Patient Instructions (Signed)
Medication Instructions:  No changes *If you need a refill on your cardiac medications before your next appointment, please call your pharmacy*  Follow-Up: At [facility], you and your health needs are our priority.  As part of our continuing mission to provide you with exceptional heart care, we have created designated Provider Care Teams.  These Care Teams include your primary Cardiologist (physician) and Advanced Practice Providers (APPs -  Physician Assistants and Nurse Practitioners) who all work together to provide you with the care you need, when you need it.  We recommend signing up for the patient portal called "MyChart".  Sign up information is provided on this After Visit Summary.  MyChart is used to connect with patients for Virtual Visits (Telemedicine).  Patients are able to view lab/test results, encounter notes, upcoming appointments, etc.  Non-urgent messages can be sent to your provider as well.   To learn more about what you can do with MyChart, go to [email or web address].    Your next appointment:   6 month(s)  Provider:   Sanda Klein, MD

## 2022-07-11 DIAGNOSIS — M1991 Primary osteoarthritis, unspecified site: Secondary | ICD-10-CM | POA: Diagnosis not present

## 2022-07-11 DIAGNOSIS — M0579 Rheumatoid arthritis with rheumatoid factor of multiple sites without organ or systems involvement: Secondary | ICD-10-CM | POA: Diagnosis not present

## 2022-07-11 DIAGNOSIS — M79642 Pain in left hand: Secondary | ICD-10-CM | POA: Diagnosis not present

## 2022-07-11 DIAGNOSIS — Z79899 Other long term (current) drug therapy: Secondary | ICD-10-CM | POA: Diagnosis not present

## 2022-07-11 DIAGNOSIS — M79641 Pain in right hand: Secondary | ICD-10-CM | POA: Diagnosis not present

## 2022-07-11 DIAGNOSIS — Z681 Body mass index (BMI) 19 or less, adult: Secondary | ICD-10-CM | POA: Diagnosis not present

## 2022-07-15 DIAGNOSIS — M0579 Rheumatoid arthritis with rheumatoid factor of multiple sites without organ or systems involvement: Secondary | ICD-10-CM | POA: Diagnosis not present

## 2022-07-16 ENCOUNTER — Encounter: Payer: Self-pay | Admitting: Internal Medicine

## 2022-07-16 NOTE — Progress Notes (Unsigned)
Subjective:    Patient ID: Monica [name], female    DOB: 1946-04-23, 77 y.o.   MRN: [id number]      HPI Gentry is here for No chief complaint on file.   3/4 - saw Blanch Media for viral illness x 2 days.  Covid test at home neg.  Flu test neg.       Medications and allergies reviewed with patient and updated if appropriate.  Current Outpatient Medications on File Prior to Visit  Medication Sig Dispense Refill   acetaminophen (TYLENOL) 500 MG tablet Take 500 mg by mouth every 6 (six) hours as needed for moderate pain or headache.     apixaban (ELIQUIS) 5 MG TABS tablet TAKE 1 TABLET TWICE A DAY 180 tablet 1   calcium carbonate (TUMS EX) 750 MG chewable tablet Chew 1,500 mg by mouth at bedtime. (Patient not taking: Reported on [id number])     certolizumab pegol (CIMZIA) 2 X 200 MG KIT Per Rheumatology (Patient taking differently: Inject 400 mg into the skin every 30 (thirty) days. Per Rheumatology) 1 kit 1   Cholecalciferol (VITAMIN D) 1000 UNITS capsule Take 1,000 Units by mouth daily.     dextromethorphan (DELSYM) 30 MG/5ML liquid Take 30 mg by mouth at bedtime as needed for cough.      Estradiol 10 MCG TABS Place 10 mcg vaginally 3 (three) times a week.     famotidine (PEPCID AC MAXIMUM STRENGTH) 20 MG tablet Take 20 mg by mouth at bedtime as needed.     fluticasone (FLONASE ALLERGY RELIEF) 50 MCG/ACT nasal spray Place 1 spray into both nostrils daily as needed for allergies.     folic acid (FOLVITE) 1 MG tablet Take 1 mg by mouth daily. Patient takes 2 capsules daily     guaiFENesin (MUCINEX) 600 MG 12 hr tablet Take 1 tablet every 12 hours by oral route.     Lactobacillus Rhamnosus, GG, (CULTURELLE PO) Take 1 tablet by mouth daily.     loratadine (CLARITIN) 10 MG tablet Take 10 mg by mouth daily.     methotrexate (RHEUMATREX) 2.5 MG tablet Take 10 mg by mouth once a week.     metoprolol succinate (TOPROL XL) 25 MG 24 hr tablet Take 1.5 tablets (37.5 mg total) by mouth in the  morning and at bedtime. 270 tablet 3   metoprolol tartrate (LOPRESSOR) 25 MG tablet Take 0.5 tablets (12.5 mg total) by mouth daily as needed. 90 tablet 3   Multiple Vitamins-Minerals (CENTRUM SILVER) tablet See admin instructions.     Respiratory Therapy Supplies (FLUTTER) DEVI Twice a day and prn as needed, may increase if feeling worse 1 each 0   Zoledronic Acid (RECLAST IV) Inject into the vein. IV yearly (Patient not taking: Reported on 07/10/2022)     No current facility-administered medications on file prior to visit.    Review of Systems     Objective:  There were no vitals filed for this visit. BP Readings from Last 3 Encounters:  07/10/22 104/64  07/08/22 118/68  06/14/22 108/62   Wt Readings from Last 3 Encounters:  07/10/22 103 lb 3.2 oz (46.8 kg)  07/08/22 100 lb (45.4 kg)  06/14/22 100 lb 3.2 oz (45.5 kg)   There is no height or weight on file to calculate BMI.    Physical Exam         Assessment & Plan:    See Problem List for Assessment and Plan of chronic medical problems.

## 2022-07-17 ENCOUNTER — Encounter: Payer: Self-pay | Admitting: Internal Medicine

## 2022-07-17 ENCOUNTER — Ambulatory Visit (INDEPENDENT_AMBULATORY_CARE_PROVIDER_SITE_OTHER): Payer: Medicare Other | Admitting: Internal Medicine

## 2022-07-17 VITALS — BP 122/76 | HR 90 | Temp 97.8°F | Ht 61.5 in | Wt 101.0 lb

## 2022-07-17 DIAGNOSIS — R1030 Lower abdominal pain, unspecified: Secondary | ICD-10-CM | POA: Diagnosis not present

## 2022-07-17 DIAGNOSIS — R053 Chronic cough: Secondary | ICD-10-CM | POA: Diagnosis not present

## 2022-07-17 NOTE — Patient Instructions (Signed)
      Medications changes include :   none     

## 2022-07-18 ENCOUNTER — Ambulatory Visit: Payer: Medicare Other

## 2022-07-18 NOTE — Progress Notes (Signed)
Pt called to do annual wellness visit. I left a msg twice on pt personal vm to rtn call

## 2022-07-22 ENCOUNTER — Ambulatory Visit: Payer: TRICARE For Life (TFL) | Admitting: Cardiovascular Disease

## 2022-07-23 ENCOUNTER — Ambulatory Visit (INDEPENDENT_AMBULATORY_CARE_PROVIDER_SITE_OTHER): Payer: Medicare Other | Admitting: Emergency Medicine

## 2022-07-23 ENCOUNTER — Encounter: Payer: Self-pay | Admitting: Emergency Medicine

## 2022-07-23 VITALS — BP 124/72 | HR 88 | Temp 98.4°F | Ht 61.5 in | Wt 101.6 lb

## 2022-07-23 DIAGNOSIS — J479 Bronchiectasis, uncomplicated: Secondary | ICD-10-CM

## 2022-07-23 DIAGNOSIS — J209 Acute bronchitis, unspecified: Secondary | ICD-10-CM | POA: Insufficient documentation

## 2022-07-23 MED ORDER — DOXYCYCLINE HYCLATE 100 MG PO TABS: 100.0000 mg | ORAL_TABLET | Freq: Two times a day (BID) | ORAL | 0 refills | Status: DC

## 2022-07-23 NOTE — Progress Notes (Signed)
Subjective:    Patient ID: Monica [name], female    DOB: 12-05-1945, 77 y.o.   MRN: [id number]  HPI  ROV 03/20/2022 --77 year old woman with bronchiectasis and chronic cough, upper airway irritation syndrome without any evidence of obstruction on methacholine.  She is colonized with Pseudomonas.  Also diagnosed with rheumatoid arthritis and on methotrexate, started on Cimzia since I last saw her. She has overall been doing fairly well. She has had some increased cough and overall inflammation since she had her covid shot last month. She is coughing up some pale yellow, sometimes associated with GERD. She is taking pepcid 1-2x a day. On loratadine, fluticasone NS.   CT chest 02/12/2022 reviewed by me, shows stable diffuse cylindrical bronchiectasis with some associated micronodularity, unchanged  ROV 07/23/22 --Monica [name] is 10 with a history of bronchiectasis (immunosuppression for rheumatoid arthritis), upper airway irritation syndrome and associated chronic cough in the setting of rhinitis and GERD.  No obstruction noted on a methacholine challenge.  She is colonized with Pseudomonas.  Most recent CT chest 02/12/2022 was stable. She is flutter valve approximately 2-3x a day. She remains on pepcid. Loratadine, [place], flonase.  She has been having increased cough, after exposure to new insulation at her home. Improved some after the insulation was removed. She has also noticed some more recent low grade fever, nausea, HA. Unclear whether she had any exposures. COVID negative. Cough can produce some loose yellow mucous. Usually associated w globus sensation.                                                                                                                                                                          Review of Systems As per history of present illness     Objective:   Physical Exam Vitals:   07/23/22 1126  BP: 124/72  Pulse: 88  Temp: 98.4 F (36.9 C)  TempSrc: Oral   SpO2: 100%  Weight: 101 lb 9.6 oz (46.1 kg)  Height: 5' 1.5" (1.562 m)   'Gen: Pleasant, thin woman, well-appearing, in no distress,  normal affect  ENT: No lesions,  mouth clear,  oropharynx clear, no postnasal drip  Neck: No JVD, no stridor  Lungs: No use of accessory muscles, clear without rales or rhonchi, no wheeze   Cardiovascular: RRR, heart sounds normal, no murmur or gallops, no peripheral edema  Musculoskeletal: No deformities, no cyanosis or clubbing  Neuro: alert, non focal  Skin: Warm, no lesions or rashes        Assessment & Plan:  Acute bronchitis More cough and yellow sputum production.  Will treat her for acute bronchitis with course of doxycycline.  Bronchiectasis without acute exacerbation (HCC) Scant sputum, could reflect an exacerbation  of her bronchiectasis.  Continue her maintenance flutter valve,.  She is not on bronchodilators.  Consider going forward.  If she continues to have cough, persistent symptoms after the doxycycline then we could consider repeat sputum culture.   Your most recent CT scan of the chest was stable at the end of October 2023.  We can talk about the timing of any repeat scan at future visit You are at moderate risk for general anesthesia due to your underlying lung disease.  This does not preclude you from having your cardiac procedure under general anesthesia as long as you are not having flaring symptoms including increased cough, sputum production, wheezing, shortness of breath at that time. Follow with Dr Lamonte Sakai in 6 months or sooner if you have any problems  Baltazar Apo, MD, PhD 07/23/2022, 5:04 PM [facility] [phone] or if no answer [phone]

## 2022-07-23 NOTE — Assessment & Plan Note (Signed)
More cough and yellow sputum production.  Will treat her for acute bronchitis with course of doxycycline.

## 2022-07-23 NOTE — Assessment & Plan Note (Addendum)
Scant sputum, could reflect an exacerbation of her bronchiectasis.  Continue her maintenance flutter valve,.  She is not on bronchodilators.  Consider going forward.  If she continues to have cough, persistent symptoms after the doxycycline then we could consider repeat sputum culture.   Your most recent CT scan of the chest was stable at the end of October 2023.  We can talk about the timing of any repeat scan at future visit You are at moderate risk for general anesthesia due to your underlying lung disease.  This does not preclude you from having your cardiac procedure under general anesthesia as long as you are not having flaring symptoms including increased cough, sputum production, wheezing, shortness of breath at that time. Follow with Dr Lamonte Sakai in 6 months or sooner if you have any problems

## 2022-07-23 NOTE — Patient Instructions (Addendum)
Please take doxycycline 100 mg twice a day for 7 days. Continue your Pepcid as you have been taking it Continue your loratadine, fluticasone nasal spray and nasal saline rinses as you have been doing them. Continue your flutter valve 2-3 times daily. Your most recent CT scan of the chest was stable and in October 2023.  We can talk about the timing of any repeat scan at future visit You are at moderate risk for general anesthesia due to your underlying lung disease.  This does not preclude you from having your cardiac procedure under general anesthesia as long as you are not having flaring symptoms including increased cough, sputum production, wheezing, shortness of breath at that time. Follow with Dr Lamonte Sakai in 6 months or sooner if you have any problems

## 2022-07-29 ENCOUNTER — Encounter: Payer: Self-pay | Admitting: Emergency Medicine

## 2022-07-30 ENCOUNTER — Ambulatory Visit (INDEPENDENT_AMBULATORY_CARE_PROVIDER_SITE_OTHER): Payer: Medicare Other

## 2022-07-30 VITALS — BP 116/60 | HR 80 | Temp 98.3°F | Ht 61.5 in | Wt 103.6 lb

## 2022-07-30 DIAGNOSIS — Z0000 Encounter for general adult medical examination without abnormal findings: Secondary | ICD-10-CM | POA: Diagnosis not present

## 2022-07-30 NOTE — Telephone Encounter (Signed)
If she cannot take PPI then she will need to continue the pepcid bid on a schedule.

## 2022-07-30 NOTE — Progress Notes (Addendum)
Subjective:   Monica [name] is a 77 y.o. female who presents for Medicare Annual (Subsequent) preventive examination.  Review of Systems     Cardiac Risk Factors include: advanced age (>48men, >9 women)     Objective:    Today's Vitals   07/30/22 1352  BP: 116/60  Pulse: 80  Temp: 98.3 F (36.8 C)  SpO2: 97%  Weight: 103 lb 9.6 oz (47 kg)  Height: 5' 1.5" (1.562 m)  PainSc: 0-No pain   Body mass index is 19.26 kg/m.     07/30/2022    2:09 PM 09/12/2021    8:08 PM 08/10/2021    9:43 PM 07/17/2021   10:47 AM 11/25/2020   12:51 PM 05/18/2020   10:11 AM 03/12/2020    3:07 PM  Advanced Directives  Does Patient Have a Medical Advance Directive? Yes No No Yes No Yes No  Type of Paramedic of [place];Living will   Living will;Healthcare Power of Attorney     Does patient want to make changes to medical advance directive?    No - Patient declined  No - Patient declined   Copy of [place] in Chart? No - copy requested   No - copy requested     Would patient like information on creating a medical advance directive?  No - Patient declined     No - Patient declined    Current Medications (verified) Outpatient Encounter Medications as of 07/30/2022  Medication Sig   acetaminophen (TYLENOL) 500 MG tablet Take 500 mg by mouth every 6 (six) hours as needed for moderate pain or headache.   apixaban (ELIQUIS) 5 MG TABS tablet TAKE 1 TABLET TWICE A DAY   certolizumab pegol (CIMZIA) 2 X 200 MG KIT Per Rheumatology (Patient taking differently: Inject 400 mg into the skin every 30 (thirty) days. Per Rheumatology)   Cholecalciferol (VITAMIN D) 1000 UNITS capsule Take 1,000 Units by mouth daily.   dextromethorphan (DELSYM) 30 MG/5ML liquid Take 30 mg by mouth at bedtime as needed for cough.    doxycycline (VIBRA-TABS) 100 MG tablet Take 1 tablet (100 mg total) by mouth 2 (two) times daily.   Estradiol 10 MCG TABS Place 10 mcg vaginally 3 (three)  times a week.   famotidine (PEPCID AC MAXIMUM STRENGTH) 20 MG tablet Take 20 mg by mouth at bedtime as needed.   fluticasone (FLONASE ALLERGY RELIEF) 50 MCG/ACT nasal spray Place 1 spray into both nostrils daily as needed for allergies.   folic acid (FOLVITE) 1 MG tablet Take 1 mg by mouth daily. Patient takes 2 capsules daily   guaiFENesin (MUCINEX) 600 MG 12 hr tablet Take 1 tablet every 12 hours by oral route.   Lactobacillus Rhamnosus, GG, (CULTURELLE PO) Take 1 tablet by mouth daily.   loratadine (CLARITIN) 10 MG tablet Take 10 mg by mouth daily.   methotrexate (RHEUMATREX) 2.5 MG tablet Take 15 mg by mouth once a week.   metoprolol succinate (TOPROL XL) 25 MG 24 hr tablet Take 1.5 tablets (37.5 mg total) by mouth in the morning and at bedtime.   metoprolol tartrate (LOPRESSOR) 25 MG tablet Take 0.5 tablets (12.5 mg total) by mouth daily as needed.   Multiple Vitamins-Minerals (CENTRUM SILVER) tablet See admin instructions.   Respiratory Therapy Supplies (FLUTTER) DEVI Twice a day and prn as needed, may increase if feeling worse   Zoledronic Acid (RECLAST IV) Inject into the vein. IV yearly   [DISCONTINUED] calcium carbonate (TUMS EX)  750 MG chewable tablet Chew 1,500 mg by mouth at bedtime.   No facility-administered encounter medications on file as of 07/30/2022.    Allergies (verified) Amoxicillin, Moxifloxacin, Nitrofurantoin, Albuterol, Amoxicillin-pot clavulanate, Avelox [moxifloxacin hcl in nacl], Benzonatate, Ciprofloxacin, Macrobid [nitrofurantoin macrocrystal], and Sulfamethoxazole-trimethoprim   History: Past Medical History:  Diagnosis Date   Adenomatous colon polyp 10/2003   Allergic rhinitis    Allergy    SEASONAL   Bronchiectasis    Chronic sinusitis    Diverticulosis    GERD (gastroesophageal reflux disease)    Hearing loss    Hemorrhoids    Insomnia    Menopausal disorder    Osteoporosis    Palpitations    Paroxysmal A-fib (HCC)    Rheumatoid arthritis  (HCC)    TMJ syndrome    Urticaria    Past Surgical History:  Procedure Laterality Date   COLONOSCOPY     NASAL SINUS SURGERY     TYMPANOSTOMY     VIDEO BRONCHOSCOPY Bilateral 07/16/2016   Procedure: VIDEO BRONCHOSCOPY WITH FLUORO;  Surgeon: Collene Gobble, MD;  Location: [facility];  Service: Cardiopulmonary;  Laterality: Bilateral;   Family History  Problem Relation Age of Onset   Rectal cancer Mother        died age 76   Seizures Mother    Colon cancer Mother 49   Cancer Mother    Heart disease Father        dies age 54   Hypertension Father    Heart disease Maternal Grandfather    AVM Brother    Cancer Maternal Grandmother    Cancer Paternal Uncle        multiple uncles with cancer   Esophageal cancer Neg Hx    Social History   Socioeconomic History   Marital status: Widowed    Spouse name: Monica [name]   Number of children: 0   Years of education: BSN   Highest education level: Not on file  Occupational History   Occupation: retired Therapist, sports  Tobacco Use   Smoking status: Never   Smokeless tobacco: Never  Vaping Use   Vaping Use: Never used  Substance and Sexual Activity   Alcohol use: Yes    Alcohol/week: 1.0 - 2.0 standard drink of alcohol    Types: 1 - 2 Glasses of wine per week    Comment: 1-2 glasses of wine a month 05/29/21   Drug use: No   Sexual activity: Not Currently  Other Topics Concern   Not on file  Social History Narrative   ** Merged History Encounter **       Lives with husband Caffeine use: 1 cup tea per day   Social Determinants of Health   Financial Resource Strain: Low Risk  (07/30/2022)   Overall Financial Resource Strain (CARDIA)    Difficulty of Paying Living Expenses: Not hard at all  Food Insecurity: No Food Insecurity (07/30/2022)   Hunger Vital Sign    Worried About Running Out of Food in the Last Year: Never true    Ran Out of Food in the Last Year: Never true  Transportation Needs: No Transportation Needs (07/30/2022)   PRAPARE -  Hydrologist (Medical): No    Lack of Transportation (Non-Medical): No  Physical Activity: Insufficiently Active (07/30/2022)   Exercise Vital Sign    Days of Exercise per Week: 3 days    Minutes of Exercise per Session: 10 min  Stress: No Stress Concern Present (07/30/2022)  [place]    Feeling of Stress : Not at all  Social Connections: Unknown (07/30/2022)   Social Connection and Isolation Panel [NHANES]    Frequency of Communication with Friends and Family: More than three times a week    Frequency of Social Gatherings with Friends and Family: Once a week    Attends Religious Services: Not on Advertising copywriter or Organizations: Yes    Attends Archivist Meetings: More than 4 times per year    Marital Status: Widowed    Tobacco Counseling Counseling given: Not Answered   Clinical Intake:  Pre-visit preparation completed: Yes  Pain : No/denies pain Pain Score: 0-No pain     Nutritional Risks: None Diabetes: No  How often do you need to have someone help you when you read instructions, pamphlets, or other written materials from your doctor or pharmacy?: 1 - Never What is the last grade level you completed in school?: Retired Therapist, sports  Diabetic? No  Interpreter Needed?: No  Information entered by :: Lisette Abu, LPN.   Activities of Daily Living    07/30/2022    1:55 PM 07/14/2022    9:37 AM  In your present state of health, do you have any difficulty performing the following activities:  Hearing? 1 1  Vision? 0 0  Difficulty concentrating or making decisions? 0 0  Walking or climbing stairs? 0 0  Dressing or bathing? 0 0  Doing errands, shopping? 0 0  Preparing Food and eating ? N N  Using the Toilet? N N  In the past six months, have you accidently leaked urine? N N  Do you have problems with loss of bowel control? N N  Managing your  Medications? N N  Managing your Finances? N N  Housekeeping or managing your Housekeeping? N N    Patient Care Team: Plotnikov, Evie Lacks, MD as PCP - General (Internal Medicine) Croitoru, Dani Gobble, MD as PCP - Cardiology (Cardiology) Vickie Epley, MD as PCP - Electrophysiology (Cardiology) Azucena Fallen, MD as Consulting Physician (Obstetrics and Gynecology) Ladene Artist, MD as Consulting Physician (Gastroenterology) Collene Gobble, MD as Consulting Physician (Pulmonary Disease) Jerrell Belfast, MD as Consulting Physician (Otolaryngology) Jari Pigg, MD as Consulting Physician (Dermatology) Jola Schmidt, MD as Consulting Physician (Ophthalmology) Gavin Pound, MD as Consulting Physician (Rheumatology)  Indicate any recent Medical Services you may have received from other than [facility] providers in the past year (date may be approximate).     Assessment:   This is a routine wellness examination for Ashlie.  Hearing/Vision screen No results found.  Dietary issues and exercise activities discussed: Current Exercise Habits: Home exercise routine, Type of exercise: walking, Time (Minutes): 10, Frequency (Times/Week): 3, Weekly Exercise (Minutes/Week): 30, Intensity: Moderate, Exercise limited by: orthopedic condition(s);cardiac condition(s)   Goals Addressed             This Visit's Progress    My goal for 2024 is to get off some of these medications.        Depression Screen    07/30/2022    1:54 PM 07/17/2022    1:17 PM 07/08/2022    9:05 AM 03/27/2022    8:53 AM 07/17/2021   11:08 AM 06/27/2021    9:16 AM 05/18/2020   10:09 AM  PHQ 2/9 Scores  PHQ - 2 Score 0 0 0 0 1 1 1     Fall Risk  07/30/2022    1:55 PM 07/17/2022    1:17 PM 07/14/2022    9:37 AM 07/08/2022    9:05 AM 03/27/2022    8:53 AM  Fall Risk   Falls in the past year? 0 0 0 0 0  Number falls in past yr: 0 0  0 0  Injury with Fall? 0 0  0 0  Risk for fall due to : No Fall Risks No Fall Risks   History of fall(s) No Fall Risks  Follow up Falls prevention discussed Falls evaluation completed  Falls evaluation completed Falls evaluation completed    [place]:  Any stairs in or around the home? Yes  If so, are there any without handrails? No  Home free of loose throw rugs in walkways, pet beds, electrical cords, etc? Yes  Adequate lighting in your home to reduce risk of falls? Yes   ASSISTIVE DEVICES UTILIZED TO PREVENT FALLS:  Life alert? No  Use of a cane, walker or w/c? No  Grab bars in the bathroom? Yes  Shower chair or bench in shower? Yes Elevated toilet seat or a handicapped toilet? Yes   TIMED UP AND GO:  Was the test performed? Yes .  Length of time to ambulate 10 feet: 8 sec.   Gait steady and fast without use of assistive device  Cognitive Function:        07/30/2022    1:56 PM  6CIT Screen  What Year? 0 points  What month? 0 points  What time? 0 points  Count back from 20 0 points  Months in reverse 0 points  Repeat phrase 0 points  Total Score 0 points    Immunizations Immunization History  Administered Date(s) Administered   COVID-19, mRNA, vaccine(Comirnaty)12 years and older 02/25/2022   Fluad Quad(high Dose 65+) 12/29/2018, 02/21/2020, 02/12/2021, 03/25/2022   Influenza Split 03/03/2009, 02/25/2011, 02/05/2012   Influenza Whole 03/02/2008, 03/02/2009, 02/06/2010   Influenza, High Dose Seasonal PF 01/28/2017   Influenza,inj,Quad PF,6+ Mos 02/04/2013, 01/24/2014, 02/02/2015, 02/01/2016, 01/22/2018   Influenza-Unspecified 01/04/2017   PFIZER Comirnaty(Gray Top)Covid-19 Tri-Sucrose Vaccine 09/12/2020   PFIZER(Purple Top)SARS-COV-2 Vaccination 05/27/2019, 06/17/2019, 02/08/2020   Pfizer Covid-19 Vaccine Bivalent Booster 16yrs & up 02/06/2021   Pneumococcal Conjugate-13 01/04/2013, 02/03/2013, 02/16/2013   Pneumococcal Polysaccharide-23 03/11/2003, 01/16/2009, 09/26/2014   Td 06/06/2001   Tdap 07/17/2012    Tetanus 05/06/2001   Zoster Recombinat (Shingrix) 05/07/2007, 12/09/2017, 05/28/2018   Zoster, Live 05/07/2007    TDAP status: Due, Education has been provided regarding the importance of this vaccine. Advised may receive this vaccine at local pharmacy or Health Dept. Aware to provide a copy of the vaccination record if obtained from local pharmacy or Health Dept. Verbalized acceptance and understanding.  Flu Vaccine status: Up to date  Pneumococcal vaccine status: Up to date  Covid-19 vaccine status: Completed vaccines  Qualifies for Shingles Vaccine? Yes   Zostavax completed Yes   Shingrix Completed?: Yes  Screening Tests Health Maintenance  Topic Date Due   DTaP/Tdap/Td (3 - Td or Tdap) 07/18/2022   Medicare Annual Wellness (AWV)  07/30/2023   COLONOSCOPY (Pts 45-64yrs Insurance coverage will need to be confirmed)  08/15/2024   Pneumonia Vaccine 89+ Years old  Completed   INFLUENZA VACCINE  Completed   DEXA SCAN  Completed   COVID-19 Vaccine  Completed   Hepatitis C Screening  Completed   Zoster Vaccines- Shingrix  Completed   HPV VACCINES  Aged Out    Health  Maintenance  Health Maintenance Due  Topic Date Due   DTaP/Tdap/Td (3 - Td or Tdap) 07/18/2022    Colorectal cancer screening: Type of screening: Colonoscopy. Completed 08/16/2019. Repeat every 5 years  Mammogram status: Completed 07/30/2021. Repeat every year  Bone Density status: Completed 07/24/2020. Results reflect: Bone density results: OSTEOPOROSIS. Repeat every 2 years.  Lung Cancer Screening: (Low Dose CT Chest recommended if Age 61-80 years, 30 pack-year currently smoking OR have quit w/in 15years.) does not qualify.   Lung Cancer Screening Referral: no  Additional Screening:  Hepatitis C Screening: does qualify; Completed 07/03/2016  Vision Screening: Recommended annual ophthalmology exams for early detection of glaucoma and other disorders of the eye. Is the patient up to date with their annual eye  exam?  Yes  Who is the provider or what is the name of the office in which the patient attends annual eye exams? Jola Schmidt, MD. If pt is not established with a provider, would they like to be referred to a provider to establish care? No .   Dental Screening: Recommended annual dental exams for proper oral hygiene  Community Resource Referral / Chronic Care Management: CRR required this visit?  No   CCM required this visit?  No      Plan:     I have personally reviewed and noted the following in the patient's chart:   Medical and social history Use of alcohol, tobacco or illicit drugs  Current medications and supplements including opioid prescriptions. Patient is not currently taking opioid prescriptions. Functional ability and status Nutritional status Physical activity Advanced directives List of other physicians Hospitalizations, surgeries, and ER visits in previous 12 months Vitals Screenings to include cognitive, depression, and falls Referrals and appointments  In addition, I have reviewed and discussed with patient certain preventive protocols, quality metrics, and best practice recommendations. A written personalized care plan for preventive services as well as general preventive health recommendations were provided to patient.     Sheral Flow, LPN   [id number]   Nurse Notes:  Normal cognitive status assessed by direct observation by this Nurse Health Advisor. No abnormalities found.   Patient Medicare AWV questionnaire was completed by the patient on 07/14/2022; I have confirmed that all information answered by patient is correct and no changes since this date.    Medical screening examination/treatment/procedure(s) were performed by non-physician practitioner and as supervising physician I was immediately available for consultation/collaboration.  I agree with above. Lew Dawes, MD

## 2022-07-30 NOTE — Telephone Encounter (Signed)
I agree with stopping the doxycycline.  Your multiple drug allergies and the methotrexate both make being on an antibiotic complicated.  If you would be willing to do so would consider starting additional GERD medication for 2 to 3 weeks to see if we can get this under better control.  You could still take your Pepcid 1-2 times a day. If she agrees then please order pantoprazole 40 mg once daily, take for 2 to 3 weeks to see if she gets benefit

## 2022-07-30 NOTE — Patient Instructions (Addendum)
Ms. Punter , Thank you for taking time to come for your Medicare Wellness Visit. I appreciate your ongoing commitment to your health goals. Please review the following plan we discussed and let me know if I can assist you in the future.   These are the goals we discussed:  Goals      My goal for 2024 is to get off some of these medications.        This is a list of the screening recommended for you and due dates:  Health Maintenance  Topic Date Due   DTaP/Tdap/Td vaccine (3 - Td or Tdap) 07/18/2022   Medicare Annual Wellness Visit  07/30/2023   Colon Cancer Screening  08/15/2024   Pneumonia Vaccine  Completed   Flu Shot  Completed   DEXA scan (bone density measurement)  Completed   COVID-19 Vaccine  Completed   Hepatitis C Screening: USPSTF Recommendation to screen - Ages 29-79 yo.  Completed   Zoster (Shingles) Vaccine  Completed   HPV Vaccine  Aged Out    Advanced directives: Yes  Conditions/risks identified: Yes  Next appointment: Follow up in one year for your annual wellness visit.   Preventive Care 79 Years and Older, Female Preventive care refers to lifestyle choices and visits with your health care provider that can promote health and wellness. What does preventive care include? A yearly physical exam. This is also called an annual well check. Dental exams once or twice a year. Routine eye exams. Ask your health care provider how often you should have your eyes checked. Personal lifestyle choices, including: Daily care of your teeth and gums. Regular physical activity. Eating a healthy diet. Avoiding tobacco and drug use. Limiting alcohol use. Practicing safe sex. Taking low-dose aspirin every day. Taking vitamin and mineral supplements as recommended by your health care provider. What happens during an annual well check? The services and screenings done by your health care provider during your annual well check will depend on your age, overall health,  lifestyle risk factors, and family history of disease. Counseling  Your health care provider may ask you questions about your: Alcohol use. Tobacco use. Drug use. Emotional well-being. Home and relationship well-being. Sexual activity. Eating habits. History of falls. Memory and ability to understand (cognition). Work and work Statistician. Reproductive health. Screening  You may have the following tests or measurements: Height, weight, and BMI. Blood pressure. Lipid and cholesterol levels. These may be checked every 5 years, or more frequently if you are over 18 years old. Skin check. Lung cancer screening. You may have this screening every year starting at age 70 if you have a 30-pack-year history of smoking and currently smoke or have quit within the past 15 years. Fecal occult blood test (FOBT) of the stool. You may have this test every year starting at age 50. Flexible sigmoidoscopy or colonoscopy. You may have a sigmoidoscopy every 5 years or a colonoscopy every 10 years starting at age 67. Hepatitis C blood test. Hepatitis B blood test. Sexually transmitted disease (STD) testing. Diabetes screening. This is done by checking your blood sugar (glucose) after you have not eaten for a while (fasting). You may have this done every 1-3 years. Bone density scan. This is done to screen for osteoporosis. You may have this done starting at age 25. Mammogram. This may be done every 1-2 years. Talk to your health care provider about how often you should have regular mammograms. Talk with your health care provider about your test  results, treatment options, and if necessary, the need for more tests. Vaccines  Your health care provider may recommend certain vaccines, such as: Influenza vaccine. This is recommended every year. Tetanus, diphtheria, and acellular pertussis (Tdap, Td) vaccine. You may need a Td booster every 10 years. Zoster vaccine. You may need this after age  46. Pneumococcal 13-valent conjugate (PCV13) vaccine. One dose is recommended after age 65. Pneumococcal polysaccharide (PPSV23) vaccine. One dose is recommended after age 20. Talk to your health care provider about which screenings and vaccines you need and how often you need them. This information is not intended to replace advice given to you by your health care provider. Make sure you discuss any questions you have with your health care provider. Document Released: 05/19/2015 Document Revised: 01/10/2016 Document Reviewed: 02/21/2015 Elsevier Interactive Patient Education  2017 [place] Prevention in the Home Falls can cause injuries. They can happen to people of all ages. There are many things you can do to make your home safe and to help prevent falls. What can I do on the outside of my home? Regularly fix the edges of walkways and driveways and fix any cracks. Remove anything that might make you trip as you walk through a door, such as a raised step or threshold. Trim any bushes or trees on the path to your home. Use bright outdoor lighting. Clear any walking paths of anything that might make someone trip, such as rocks or tools. Regularly check to see if handrails are loose or broken. Make sure that both sides of any steps have handrails. Any raised decks and porches should have guardrails on the edges. Have any leaves, snow, or ice cleared regularly. Use sand or salt on walking paths during winter. Clean up any spills in your garage right away. This includes oil or grease spills. What can I do in the bathroom? Use night lights. Install grab bars by the toilet and in the tub and shower. Do not use towel bars as grab bars. Use non-skid mats or decals in the tub or shower. If you need to sit down in the shower, use a plastic, non-slip stool. Keep the floor dry. Clean up any water that spills on the floor as soon as it happens. Remove soap buildup in the tub or shower  regularly. Attach bath mats securely with double-sided non-slip rug tape. Do not have throw rugs and other things on the floor that can make you trip. What can I do in the bedroom? Use night lights. Make sure that you have a light by your bed that is easy to reach. Do not use any sheets or blankets that are too big for your bed. They should not hang down onto the floor. Have a firm chair that has side arms. You can use this for support while you get dressed. Do not have throw rugs and other things on the floor that can make you trip. What can I do in the kitchen? Clean up any spills right away. Avoid walking on wet floors. Keep items that you use a lot in easy-to-reach places. If you need to reach something above you, use a strong step stool that has a grab bar. Keep electrical cords out of the way. Do not use floor polish or wax that makes floors slippery. If you must use wax, use non-skid floor wax. Do not have throw rugs and other things on the floor that can make you trip. What can I do with my stairs?  Do not leave any items on the stairs. Make sure that there are handrails on both sides of the stairs and use them. Fix handrails that are broken or loose. Make sure that handrails are as long as the stairways. Check any carpeting to make sure that it is firmly attached to the stairs. Fix any carpet that is loose or worn. Avoid having throw rugs at the top or bottom of the stairs. If you do have throw rugs, attach them to the floor with carpet tape. Make sure that you have a light switch at the top of the stairs and the bottom of the stairs. If you do not have them, ask someone to add them for you. What else can I do to help prevent falls? Wear shoes that: Do not have high heels. Have rubber bottoms. Are comfortable and fit you well. Are closed at the toe. Do not wear sandals. If you use a stepladder: Make sure that it is fully opened. Do not climb a closed stepladder. Make sure that  both sides of the stepladder are locked into place. Ask someone to hold it for you, if possible. Clearly mark and make sure that you can see: Any grab bars or handrails. First and last steps. Where the edge of each step is. Use tools that help you move around (mobility aids) if they are needed. These include: Canes. Walkers. Scooters. Crutches. Turn on the lights when you go into a dark area. Replace any light bulbs as soon as they burn out. Set up your furniture so you have a clear path. Avoid moving your furniture around. If any of your floors are uneven, fix them. If there are any pets around you, be aware of where they are. Review your medicines with your doctor. Some medicines can make you feel dizzy. This can increase your chance of falling. Ask your doctor what other things that you can do to help prevent falls. This information is not intended to replace advice given to you by your health care provider. Make sure you discuss any questions you have with your health care provider. Document Released: 02/16/2009 Document Revised: 09/28/2015 Document Reviewed: 05/27/2014 Elsevier Interactive Patient Education  2017 [employer].

## 2022-08-05 DIAGNOSIS — Z1231 Encounter for screening mammogram for malignant neoplasm of breast: Secondary | ICD-10-CM | POA: Diagnosis not present

## 2022-08-12 DIAGNOSIS — H903 Sensorineural hearing loss, bilateral: Secondary | ICD-10-CM | POA: Insufficient documentation

## 2022-08-12 DIAGNOSIS — H6991 Unspecified Eustachian tube disorder, right ear: Secondary | ICD-10-CM | POA: Diagnosis not present

## 2022-08-12 DIAGNOSIS — M0579 Rheumatoid arthritis with rheumatoid factor of multiple sites without organ or systems involvement: Secondary | ICD-10-CM | POA: Diagnosis not present

## 2022-09-02 ENCOUNTER — Encounter: Payer: Self-pay | Admitting: Internal Medicine

## 2022-09-02 ENCOUNTER — Ambulatory Visit (INDEPENDENT_AMBULATORY_CARE_PROVIDER_SITE_OTHER): Payer: Medicare Other | Admitting: Internal Medicine

## 2022-09-02 VITALS — BP 110/68 | HR 77 | Temp 98.6°F | Ht 61.5 in | Wt 101.0 lb

## 2022-09-02 DIAGNOSIS — I480 Paroxysmal atrial fibrillation: Secondary | ICD-10-CM

## 2022-09-02 DIAGNOSIS — E559 Vitamin D deficiency, unspecified: Secondary | ICD-10-CM | POA: Diagnosis not present

## 2022-09-02 DIAGNOSIS — R5382 Chronic fatigue, unspecified: Secondary | ICD-10-CM | POA: Diagnosis not present

## 2022-09-02 DIAGNOSIS — J479 Bronchiectasis, uncomplicated: Secondary | ICD-10-CM

## 2022-09-02 NOTE — Patient Instructions (Signed)
USEFUL THINGS FOR HAND ARTHRITIS  RICE SOCK HEATING PAD: [email or web address]://www.instructables.com/Homemade-Heating-Pad/    SILICONE PADS:    BRIX JAR OPENER:    NITRILE COATED GARDEN GLOVES:   THUMB BRACE:     BLUE EMU CREAM:    

## 2022-09-02 NOTE — Assessment & Plan Note (Signed)
On Vit D 

## 2022-09-02 NOTE — Assessment & Plan Note (Signed)
A fib - ablation is in June

## 2022-09-02 NOTE — Assessment & Plan Note (Signed)
post-COVID and other issues/meds 

## 2022-09-02 NOTE — Progress Notes (Signed)
Subjective:  Patient ID: Monica [name], female    DOB: 1946-01-30  Age: 77 y.o. MRN: [id number]  CC: No chief complaint on file.   HPI  [name] presents for A fib - ablation is in June Dr Delton Coombes gave Doxy - stopped Labs w/Dr Nickola Major C/o cough post COVID   F/u on RA (on MTX and Cimzia monthly)  Outpatient Medications Prior to Visit  Medication Sig Dispense Refill   acetaminophen (TYLENOL) 500 MG tablet Take 500 mg by mouth every 6 (six) hours as needed for moderate pain or headache.     apixaban (ELIQUIS) 5 MG TABS tablet TAKE 1 TABLET TWICE A DAY 180 tablet 1   certolizumab pegol (CIMZIA) 2 X 200 MG KIT Per Rheumatology (Patient taking differently: Inject 400 mg into the skin every 30 (thirty) days. Per Rheumatology) 1 kit 1   Cholecalciferol (VITAMIN D) 1000 UNITS capsule Take 1,000 Units by mouth daily.     dextromethorphan (DELSYM) 30 MG/5ML liquid Take 30 mg by mouth at bedtime as needed for cough.      Estradiol 10 MCG TABS Place 10 mcg vaginally 3 (three) times a week.     famotidine (PEPCID AC MAXIMUM STRENGTH) 20 MG tablet Take 20 mg by mouth at bedtime as needed.     fluticasone (FLONASE ALLERGY RELIEF) 50 MCG/ACT nasal spray Place 1 spray into both nostrils daily as needed for allergies.     folic acid (FOLVITE) 1 MG tablet Take 1 mg by mouth daily. Patient takes 2 capsules daily     guaiFENesin (MUCINEX) 600 MG 12 hr tablet Take 1 tablet every 12 hours by oral route.     Lactobacillus Rhamnosus, GG, (CULTURELLE PO) Take 1 tablet by mouth daily.     loratadine (CLARITIN) 10 MG tablet Take 10 mg by mouth daily.     methotrexate (RHEUMATREX) 2.5 MG tablet Take 15 mg by mouth once a week.     metoprolol succinate (TOPROL XL) 25 MG 24 hr tablet Take 1.5 tablets (37.5 mg total) by mouth in the morning and at bedtime. 270 tablet 3   metoprolol tartrate (LOPRESSOR) 25 MG tablet Take 0.5 tablets (12.5 mg total) by mouth daily as needed. 90 tablet 3   Multiple  Vitamins-Minerals (CENTRUM SILVER) tablet See admin instructions.     Respiratory Therapy Supplies (FLUTTER) DEVI Twice a day and prn as needed, may increase if feeling worse 1 each 0   triamcinolone acetonide (KENALOG-40) 40 MG/ML injection Inject into the articular space.     Zoledronic Acid (RECLAST IV) Inject into the vein. IV yearly     doxycycline (VIBRA-TABS) 100 MG tablet Take 1 tablet (100 mg total) by mouth 2 (two) times daily. 14 tablet 0   No facility-administered medications prior to visit.    ROS: Review of Systems  Constitutional:  Positive for fatigue. Negative for activity change, appetite change, chills and unexpected weight change.  HENT:  Positive for congestion. Negative for mouth sores and sinus pressure.   Eyes:  Negative for visual disturbance.  Respiratory:  Positive for cough. Negative for chest tightness.   Gastrointestinal:  Negative for abdominal pain and nausea.  Genitourinary:  Negative for difficulty urinating, frequency and vaginal pain.  Musculoskeletal:  Positive for arthralgias. Negative for back pain and gait problem.  Skin:  Negative for pallor and rash.  Neurological:  Positive for weakness. Negative for dizziness, tremors, numbness and headaches.  Psychiatric/Behavioral:  Positive for sleep disturbance. Negative for confusion.  Objective:  BP 110/68 (BP Location: Left Arm, Patient Position: Sitting, Cuff Size: Large)   Pulse 77   Temp 98.6 F (37 C) (Oral)   Ht 5' 1.5" (1.562 m)   Wt 101 lb (45.8 kg)   SpO2 99%   BMI 18.77 kg/m   BP Readings from Last 3 Encounters:  09/02/22 110/68  07/30/22 116/60  07/23/22 124/72    Wt Readings from Last 3 Encounters:  09/02/22 101 lb (45.8 kg)  07/30/22 103 lb 9.6 oz (47 kg)  07/23/22 101 lb 9.6 oz (46.1 kg)    Physical Exam Constitutional:      General: She is not in acute distress.    Appearance: Normal appearance. She is well-developed.  HENT:     Head: Normocephalic.     Right Ear:  External ear normal.     Left Ear: External ear normal.     Nose: Nose normal.  Eyes:     General:        Right eye: No discharge.        Left eye: No discharge.     Conjunctiva/sclera: Conjunctivae normal.     Pupils: Pupils are equal, round, and reactive to light.  Neck:     Thyroid: No thyromegaly.     Vascular: No JVD.     Trachea: No tracheal deviation.  Cardiovascular:     Rate and Rhythm: Normal rate. Rhythm irregular.     Heart sounds: Normal heart sounds.  Pulmonary:     Effort: No respiratory distress.     Breath sounds: No stridor. No wheezing.  Abdominal:     General: Bowel sounds are normal. There is no distension.     Palpations: Abdomen is soft. There is no mass.     Tenderness: There is no abdominal tenderness. There is no guarding or rebound.  Musculoskeletal:        General: No tenderness.     Cervical back: Normal range of motion and neck supple. No rigidity.  Lymphadenopathy:     Cervical: No cervical adenopathy.  Skin:    Findings: No erythema or rash.  Neurological:     Mental Status: She is oriented to person, place, and time.     Cranial Nerves: No cranial nerve deficit.     Motor: No abnormal muscle tone.     Coordination: Coordination normal.     Deep Tendon Reflexes: Reflexes normal.  Psychiatric:        Mood and Affect: Mood normal.        Behavior: Behavior normal.        Thought Content: Thought content normal.        Judgment: Judgment normal.     Lab Results  Component Value Date   WBC 17.9 (H) 09/12/2021   HGB 12.2 09/12/2021   HCT 36.3 09/12/2021   PLT 453 (H) 09/12/2021   GLUCOSE 115 (H) 09/12/2021   CHOL 140 07/06/2018   TRIG 41.0 07/06/2018   HDL 62.80 07/06/2018   LDLCALC 69 07/06/2018   ALT 13 09/12/2021   AST 19 09/12/2021   NA 135 09/12/2021   K 3.8 09/12/2021   CL 100 09/12/2021   CREATININE 0.76 09/12/2021   BUN 17 09/12/2021   CO2 27 09/12/2021   TSH 3.09 05/30/2021    CT Chest Wo Contrast  Result Date:  02/13/2022 CLINICAL DATA:  Follow-up bronchiectasis.  Chronic cough. EXAM: CT CHEST WITHOUT CONTRAST TECHNIQUE: Multidetector CT imaging of the chest was performed following the standard  protocol without IV contrast. RADIATION DOSE REDUCTION: This exam was performed according to the departmental dose-optimization program which includes automated exposure control, adjustment of the mA and/or kV according to patient size and/or use of iterative reconstruction technique. COMPARISON:  Radiographs 09/12/2021 and 08/10/2021. CT 02/12/2021 and 07/10/2018. FINDINGS: Cardiovascular: Mild atherosclerosis of the aorta without acute vascular findings on noncontrast imaging. The heart size is normal. There is no pericardial effusion. Mediastinum/Nodes: There are no enlarged mediastinal, hilar or axillary lymph nodes.Hilar assessment is limited by the lack of intravenous contrast, although the hilar contours appear unchanged. Small hiatal hernia. The thyroid gland and trachea appear unremarkable. Lungs/Pleura: No pleural effusion or pneumothorax. Redemonstrated is diffuse cylindrical bronchiectasis with multifocal scarring in both lungs, greatest in the right middle lobe and lingula. Clustered nodularity in both lung bases appears unchanged. No confluent airspace opacity or enlarging pulmonary nodules are identified. There is stable biapical scarring. Upper abdomen: The visualized upper abdomen appears stable without significant findings. Musculoskeletal/Chest wall: There is no chest wall mass or suspicious osseous finding. IMPRESSION: 1. 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 2. No acute chest findings. 3. Small hiatal hernia. 4.  Aortic Atherosclerosis (ICD10-I70.0). Electronically Signed   By: Carey Bullocks M.D.   On: 02/13/2022 18:22    Assessment & Plan:   Problem List  Items Addressed This Visit     Bronchiectasis without acute exacerbation (HCC) - Primary     Dr Delton Coombes gave Doxy - stopped      Paroxysmal atrial fibrillation (HCC)    A fib - ablation is in June      Fatigue     post-COVID and other issues/meds      Vitamin D deficiency    On Vit D         No orders of the defined types were placed in this encounter.     Follow-up: Return in about 4 months (around 01/02/2023) for a follow-up visit.  Sonda Primes, MD

## 2022-09-02 NOTE — Assessment & Plan Note (Addendum)
  Dr Delton Coombes gave Doxy - stopped Post COVID cough - OTC meds

## 2022-09-04 ENCOUNTER — Emergency Department (HOSPITAL_BASED_OUTPATIENT_CLINIC_OR_DEPARTMENT_OTHER): Payer: Medicare Other

## 2022-09-04 ENCOUNTER — Other Ambulatory Visit: Payer: Self-pay

## 2022-09-04 ENCOUNTER — Emergency Department (HOSPITAL_BASED_OUTPATIENT_CLINIC_OR_DEPARTMENT_OTHER)
Admission: EM | Admit: 2022-09-04 | Discharge: 2022-09-04 | Disposition: A | Discharge status: Home / Self Care | Payer: Medicare Other | Attending: Emergency Medicine | Admitting: Emergency Medicine

## 2022-09-04 DIAGNOSIS — E871 Hypo-osmolality and hyponatremia: Secondary | ICD-10-CM | POA: Diagnosis not present

## 2022-09-04 DIAGNOSIS — K5730 Diverticulosis of large intestine without perforation or abscess without bleeding: Secondary | ICD-10-CM | POA: Diagnosis not present

## 2022-09-04 DIAGNOSIS — R1030 Lower abdominal pain, unspecified: Secondary | ICD-10-CM | POA: Diagnosis present

## 2022-09-04 DIAGNOSIS — Z7901 Long term (current) use of anticoagulants: Secondary | ICD-10-CM | POA: Insufficient documentation

## 2022-09-04 DIAGNOSIS — K6289 Other specified diseases of anus and rectum: Secondary | ICD-10-CM | POA: Diagnosis not present

## 2022-09-04 DIAGNOSIS — R301 Vesical tenesmus: Secondary | ICD-10-CM | POA: Insufficient documentation

## 2022-09-04 DIAGNOSIS — K449 Diaphragmatic hernia without obstruction or gangrene: Secondary | ICD-10-CM | POA: Diagnosis not present

## 2022-09-04 DIAGNOSIS — R198 Other specified symptoms and signs involving the digestive system and abdomen: Secondary | ICD-10-CM

## 2022-09-04 LAB — COMPREHENSIVE METABOLIC PANEL
ALT: 11 U/L (ref 0–44)
AST: 16 U/L (ref 15–41)
Albumin: 4.2 g/dL (ref 3.5–5.0)
Alkaline Phosphatase: 68 U/L (ref 38–126)
Anion gap: 9 (ref 5–15)
BUN: 22 mg/dL (ref 8–23)
CO2: 26 mmol/L (ref 22–32)
Calcium: 9.3 mg/dL (ref 8.9–10.3)
Chloride: 96 mmol/L — ABNORMAL LOW (ref 98–111)
Creatinine, Ser: 0.63 mg/dL (ref 0.44–1.00)
GFR, Estimated: >60 mL/min (ref >60)
Glucose, Bld: 96 mg/dL (ref 70–99)
Potassium: 4.1 mmol/L (ref 3.5–5.1)
Sodium: 131 mmol/L — ABNORMAL LOW (ref 135–145)
Total Bilirubin: 0.5 mg/dL (ref 0.3–1.2)
Total Protein: 7.6 g/dL (ref 6.5–8.1)

## 2022-09-04 LAB — CBC
HCT: 41.6 % (ref 36.0–46.0)
Hemoglobin: 14.1 g/dL (ref 12.0–15.0)
MCH: 31.8 pg (ref 26.0–34.0)
MCHC: 33.9 g/dL (ref 30.0–36.0)
MCV: 93.7 fL (ref 80.0–100.0)
Platelets: 361 10*3/uL (ref 150–400)
RBC: 4.44 MIL/uL (ref 3.87–5.11)
RDW: 12.5 % (ref 11.5–15.5)
WBC: 13.7 10*3/uL — ABNORMAL HIGH (ref 4.0–10.5)
nRBC: 0 % (ref 0.0–0.2)

## 2022-09-04 LAB — URINALYSIS, ROUTINE W REFLEX MICROSCOPIC
Bilirubin Urine: NEGATIVE
Glucose, UA: NEGATIVE mg/dL
Hgb urine dipstick: NEGATIVE
Ketones, ur: NEGATIVE mg/dL
Leukocytes,Ua: NEGATIVE
Nitrite: NEGATIVE
Protein, ur: NEGATIVE mg/dL
Specific Gravity, Urine: 1.011 (ref 1.005–1.030)
pH: 6 (ref 5.0–8.0)

## 2022-09-04 LAB — LIPASE, BLOOD: Lipase: 17 U/L (ref 11–51)

## 2022-09-04 MED ORDER — IOHEXOL 300 MG/ML  SOLN
100.0000 mL | Freq: Once | INTRAMUSCULAR | Status: AC | PRN
  Administered 2022-09-04: 75 mL via INTRAVENOUS

## 2022-09-04 NOTE — ED Triage Notes (Signed)
Patient here POV from Home.  Endorses 5-6 Days ago having some Constipation/Fecal impaction. Treated with Conservative Therapy such as Diet Changes with some relief. Diarrhea afterwards that has continued through today. Associated with Lower ABD Discomfort.   Some nausea. No Emesis or Fever.  NAD Noted during Triage. A&Ox4. GCS 15. Ambulatory.

## 2022-09-04 NOTE — ED Notes (Signed)
RN reviewed discharge instructions with pt. Pt verbalized understanding and had no further questions. VSS upon discharge.  

## 2022-09-04 NOTE — ED Notes (Signed)
Patient transported to CT 

## 2022-09-04 NOTE — ED Provider Notes (Signed)
[place] EMERGENCY DEPARTMENT AT [facility] Provider Note   CSN: [id number] Arrival date & time: 09/04/22  1522     History  Chief Complaint  Patient presents with   Abdominal Pain    Monica [name] is a 77 y.o. female who is for lower abdominal discomfort.  Patient states that she was constipated and had stool ball in her rectum.  She was making stool around it and then was able to pass it several days ago.  After that she had a few episodes of diarrhea and then loose stools and has had lower abdominal discomfort.  Patient is concerned she could have diverticulitis.  She has never had diverticulitis.  She describes the pain as achy.  She has a little bit of tenesmus.  She denies fever, chills, vomiting.   Abdominal Pain      Home Medications Prior to Admission medications   Medication Sig Start Date End Date Taking? Authorizing Provider  acetaminophen (TYLENOL) 500 MG tablet Take 500 mg by mouth every 6 (six) hours as needed for moderate pain or headache.    [provider]  apixaban (ELIQUIS) 5 MG TABS tablet TAKE 1 TABLET TWICE A DAY 05/06/22   Croitoru, Mihai, MD  certolizumab pegol (CIMZIA) 2 X 200 MG KIT Per Rheumatology Patient taking differently: Inject 400 mg into the skin every 30 (thirty) days. Per Rheumatology 02/28/22   Plotnikov, Georgina Quint, MD  Cholecalciferol (VITAMIN D) 1000 UNITS capsule Take 1,000 Units by mouth daily.    [provider]  dextromethorphan (DELSYM) 30 MG/5ML liquid Take 30 mg by mouth at bedtime as needed for cough.     [provider]  Estradiol 10 MCG TABS Place 10 mcg vaginally 3 (three) times a week.    [provider]  famotidine (PEPCID AC MAXIMUM STRENGTH) 20 MG tablet Take 20 mg by mouth at bedtime as needed.    [provider]  fluticasone (FLONASE ALLERGY RELIEF) 50 MCG/ACT nasal spray Place 1 spray into both nostrils daily as needed for allergies.    [provider]  folic  acid (FOLVITE) 1 MG tablet Take 1 mg by mouth daily. Patient takes 2 capsules daily    [provider]  guaiFENesin (MUCINEX) 600 MG 12 hr tablet Take 1 tablet every 12 hours by oral route.    [provider]  Lactobacillus Rhamnosus, GG, (CULTURELLE PO) Take 1 tablet by mouth daily.    [provider]  loratadine (CLARITIN) 10 MG tablet Take 10 mg by mouth daily.    [provider]  methotrexate (RHEUMATREX) 2.5 MG tablet Take 15 mg by mouth once a week. 08/28/21   [provider]  metoprolol succinate (TOPROL XL) 25 MG 24 hr tablet Take 1.5 tablets (37.5 mg total) by mouth in the morning and at bedtime. 04/24/22   Croitoru, Mihai, MD  metoprolol tartrate (LOPRESSOR) 25 MG tablet Take 0.5 tablets (12.5 mg total) by mouth daily as needed. 11/07/21   Plotnikov, Georgina Quint, MD  Multiple Vitamins-Minerals (CENTRUM SILVER) tablet See admin instructions.    [provider]  Respiratory Therapy Supplies (FLUTTER) DEVI Twice a day and prn as needed, may increase if feeling worse 01/30/18   Coral Ceo, NP  triamcinolone acetonide (KENALOG-40) 40 MG/ML injection Inject into the articular space. 04/10/21   [provider]  Zoledronic Acid (RECLAST IV) Inject into the vein. IV yearly    [provider]      Allergies  Amoxicillin, Moxifloxacin, Nitrofurantoin, Albuterol, Amoxicillin-pot clavulanate, Avelox [moxifloxacin hcl in nacl], Benzonatate, Ciprofloxacin, Macrobid [nitrofurantoin macrocrystal], and Sulfamethoxazole-trimethoprim    Review of Systems   Review of Systems  Gastrointestinal:  Positive for abdominal pain.    Physical Exam Updated Vital Signs BP 133/74 (BP Location: Right Arm)   Pulse 85   Temp 97.9 F (36.6 C) (Oral)   Resp 17   Ht 5' 1.5" (1.562 m)   Wt 45.8 kg   SpO2 99%   BMI 18.77 kg/m  Physical Exam Physical Exam  Nursing note and vitals reviewed. Constitutional: She is oriented to person, place, and  time. She appears well-developed and well-nourished. No distress.  HENT:  Head: Normocephalic and atraumatic.  Eyes: Conjunctivae normal and EOM are normal. Pupils are equal, round, and reactive to light. No scleral icterus.  Neck: Normal range of motion.  Cardiovascular: Normal rate, regular rhythm and normal heart sounds.  Exam reveals no gallop and no friction rub.   No murmur heard. Pulmonary/Chest: Effort normal and breath sounds normal. No respiratory distress.  Abdominal: Soft. Bowel sounds are normal. She exhibits no distension and no mass. There is no tenderness. There is no guarding.  Neurological: She is alert and oriented to person, place, and time.  Skin: Skin is warm and dry. She is not diaphoretic.   ED Results / Procedures / Treatments   Labs (all labs ordered are listed, but only abnormal results are displayed) Labs Reviewed  COMPREHENSIVE METABOLIC PANEL - Abnormal; Notable for the following components:      Result Value   Sodium 131 (*)    Chloride 96 (*)    All other components within normal limits  CBC - Abnormal; Notable for the following components:   WBC 13.7 (*)    All other components within normal limits  URINALYSIS, ROUTINE W REFLEX MICROSCOPIC - Abnormal; Notable for the following components:   Color, Urine COLORLESS (*)    All other components within normal limits  LIPASE, BLOOD    EKG None  Radiology CT ABDOMEN PELVIS W CONTRAST  Result Date: 09/04/2022 CLINICAL DATA:  Constipation, abdominal pain EXAM: CT ABDOMEN AND PELVIS WITH CONTRAST TECHNIQUE: Multidetector CT imaging of the abdomen and pelvis was performed using the standard protocol following bolus administration of intravenous contrast. RADIATION DOSE REDUCTION: This exam was performed according to the departmental dose-optimization program which includes automated exposure control, adjustment of the mA and/or kV according to patient size and/or use of iterative reconstruction technique.  CONTRAST:  75mL OMNIPAQUE IOHEXOL 300 MG/ML  SOLN COMPARISON:  Previous CT abdomen and pelvis done on 02/28/2020, CT chest done on 02/12/2022 FINDINGS: Lower chest: Bronchiectasis is seen, more so in the left lower lung field. Increased interstitial markings in the periphery of both lower lung fields with no significant interval change. There are scattered nodular densities in the visualized lower lung fields with no significant change. There is no pleural effusion. Hepatobiliary: There is a 4 mm low-density in the left lobe of liver with no interval change suggesting possible cyst. There is no dilation of bile ducts. Gallbladder is unremarkable. Pancreas: No focal abnormalities are seen. Spleen: Unremarkable. Adrenals/Urinary Tract: Adrenals are unremarkable. There is no hydronephrosis. There are no renal or ureteral stones. Urinary bladder is not distended. Stomach/Bowel: Small hiatal hernia is seen. Stomach is not distended. Small bowel loops are not dilated. Appendix is not dilated. Scattered diverticula are seen in colon. There is no evidence of focal acute diverticulitis. There is moderate amount of stool  in cecum. Small amount of stool is seen in the rectosigmoid. There is no fecal impaction in rectosigmoid. There is no significant wall thickening in colon. There is no pericolic stranding. Vascular/Lymphatic: Scattered arterial calcifications are seen. Reproductive: Unremarkable. Other: There is no ascites or pneumoperitoneum. Small umbilical hernia containing fat is seen. Musculoskeletal: No acute findings are seen. There is minimal disc space narrowing and minimal anterolisthesis at L3-L4 level. IMPRESSION: There is no evidence of intestinal obstruction or pneumoperitoneum. Appendix is not dilated. There is no hydronephrosis. Diverticulosis of colon without signs of diverticulitis. Moderate amount of stool is seen in right colon. Small amount of stool is seen in the rectosigmoid. There is no fecal impaction  in rectosigmoid. Small hiatal hernia. Possible small hepatic cyst. Bronchiectasis. There is peribronchial thickening and subcentimeter nodular densities in both lower lung fields suggesting chronic inflammation/infection. No new focal pulmonary consolidation is seen. There is no pleural effusion. Lumbar spondylosis. Electronically Signed   By: Ernie Avena M.D.   On: 09/04/2022 17:42    Procedures Procedures    Medications Ordered in ED Medications  iohexol (OMNIPAQUE) 300 MG/ML solution 100 mL (75 mLs Intravenous Contrast Given 09/04/22 1711)    ED Course/ Medical Decision Making/ A&P                             Medical Decision Making Amount and/or Complexity of Data Reviewed Labs: ordered. Radiology: ordered.  Risk Prescription drug management.  Patient where here with abdominal pain. The differential diagnosis for generalized abdominal pain includes, but is not limited to AAA, gastroenteritis, appendicitis, Bowel obstruction, Bowel perforation. Gastroparesis, DKA, Hernia, Inflammatory bowel disease, mesenteric ischemia, pancreatitis, peritonitis SBP, volvulus.  I reviewed patient's labs which show a white count of 13.7, no other acute findings.  Mild hyponatremia.  I ordered and visualized as well as interpreted CT abdomen pelvis, no acute findings.  Suspect some rectal irritation after recent bowel issues.  Appears appropriate for discharge at this time.        Final Clinical Impression(s) / ED Diagnoses Final diagnoses:  None    Rx / DC Orders ED Discharge Orders     None         Jhana, Giarratano, PA-C 09/04/22 1802    Maia Plan, MD 09/05/22 [phone]

## 2022-09-04 NOTE — Discharge Instructions (Signed)
Tenesmus is a sensation of rectal urgency.  This can occur sometimes after use of laxatives or due to rectal irritation.  It does not appear that you have any acute emergency findings and this should resolve in the next few days.  If you have any worsening in your condition including severe pain, fever or chills intractable vomiting please return to the emergency department otherwise may follow closely with your primary care physician.

## 2022-09-13 ENCOUNTER — Ambulatory Visit (INDEPENDENT_AMBULATORY_CARE_PROVIDER_SITE_OTHER): Payer: Medicare Other | Admitting: Family Medicine

## 2022-09-13 ENCOUNTER — Encounter: Payer: Self-pay | Admitting: Family Medicine

## 2022-09-13 ENCOUNTER — Ambulatory Visit (INDEPENDENT_AMBULATORY_CARE_PROVIDER_SITE_OTHER): Payer: Medicare Other

## 2022-09-13 VITALS — BP 106/66 | HR 85 | Temp 97.6°F | Ht 61.5 in | Wt 98.0 lb

## 2022-09-13 DIAGNOSIS — Z8719 Personal history of other diseases of the digestive system: Secondary | ICD-10-CM

## 2022-09-13 DIAGNOSIS — R1030 Lower abdominal pain, unspecified: Secondary | ICD-10-CM

## 2022-09-13 DIAGNOSIS — R195 Other fecal abnormalities: Secondary | ICD-10-CM

## 2022-09-13 DIAGNOSIS — R109 Unspecified abdominal pain: Secondary | ICD-10-CM | POA: Diagnosis not present

## 2022-09-13 NOTE — Progress Notes (Signed)
Subjective:     Patient ID: Monica [name], female    DOB: 1945/09/26, 77 y.o.   MRN: [id number]  Chief Complaint  Patient presents with   Abdominal Pain    Abdominal discomfort on and off, problems with bowels.   Stool on right side of colon shown on recent CT    Abdominal Pain   Patient is in today for lower abdominal pain. Feels constipated. States she is having bowel movements but they are more narrow, smaller girth as if they are having to go around something. ?ball of stool. Denies blood.  Taking Miralax, stool softeners and bran. Also takes a probiotic.   No fever, chills, dizziness, chest pain, shortness of breath, vomiting.   She was evaluated in the ED for the same and had a CT on 09/04/2022 showing a moderate amount of stool on the right.    Health Maintenance Due  Topic Date Due   DTaP/Tdap/Td (3 - Td or Tdap) 07/18/2022    Past Medical History:  Diagnosis Date   Adenomatous colon polyp 10/2003   Allergic rhinitis    Allergy    SEASONAL   Bronchiectasis    Chronic sinusitis    Diverticulosis    GERD (gastroesophageal reflux disease)    Hearing loss    Hemorrhoids    Insomnia    Menopausal disorder    Osteoporosis    Palpitations    Paroxysmal A-fib (HCC)    Rheumatoid arthritis (HCC)    TMJ syndrome    Urticaria     Past Surgical History:  Procedure Laterality Date   COLONOSCOPY     NASAL SINUS SURGERY     TYMPANOSTOMY     VIDEO BRONCHOSCOPY Bilateral 07/16/2016   Procedure: VIDEO BRONCHOSCOPY WITH FLUORO;  Surgeon: Leslye Peer, MD;  Location: [facility];  Service: Cardiopulmonary;  Laterality: Bilateral;    Family History  Problem Relation Age of Onset   Rectal cancer Mother        died age 66   Seizures Mother    Colon cancer Mother 17   Cancer Mother    Heart disease Father        dies age 45   Hypertension Father    Heart disease Maternal Grandfather    AVM Brother    Cancer Maternal Grandmother    Cancer Paternal Uncle         multiple uncles with cancer   Esophageal cancer Neg Hx     Social History   Socioeconomic History   Marital status: Widowed    Spouse name: Nadine Counts   Number of children: 0   Years of education: BSN   Highest education level: Bachelor's degree (e.g., BA, AB, BS)  Occupational History   Occupation: retired Charity fundraiser  Tobacco Use   Smoking status: Never   Smokeless tobacco: Never  Vaping Use   Vaping Use: Never used  Substance and Sexual Activity   Alcohol use: Yes    Alcohol/week: 1.0 - 2.0 standard drink of alcohol    Types: 1 - 2 Glasses of wine per week    Comment: 1-2 glasses of wine a month 05/29/21   Drug use: No   Sexual activity: Not Currently  Other Topics Concern   Not on file  Social History Narrative   ** Merged History Encounter **       Lives with husband Caffeine use: 1 cup tea per day   Social Determinants of Health   Financial Resource Strain: Low Risk  (  08/29/2022)   Overall Financial Resource Strain (CARDIA)    Difficulty of Paying Living Expenses: Not hard at all  Food Insecurity: No Food Insecurity (08/29/2022)   Hunger Vital Sign    Worried About Running Out of Food in the Last Year: Never true    Ran Out of Food in the Last Year: Never true  Transportation Needs: No Transportation Needs (08/29/2022)   PRAPARE - Administrator, Civil Service (Medical): No    Lack of Transportation (Non-Medical): No  Physical Activity: Insufficiently Active (08/29/2022)   Exercise Vital Sign    Days of Exercise per Week: 3 days    Minutes of Exercise per Session: 20 min  Stress: No Stress Concern Present (08/29/2022)   [employer] of Occupational Health - Occupational Stress Questionnaire    Feeling of Stress : Not at all  Social Connections: Moderately Integrated (08/29/2022)   Social Connection and Isolation Panel [NHANES]    Frequency of Communication with Friends and Family: More than three times a week    Frequency of Social Gatherings with  Friends and Family: Twice a week    Attends Religious Services: 1 to 4 times per year    Active Member of [employer] or Organizations: Yes    Attends Banker Meetings: More than 4 times per year    Marital Status: Widowed  Intimate Partner Violence: Not At Risk (07/30/2022)   Humiliation, Afraid, Rape, and Kick questionnaire    Fear of Current or Ex-Partner: No    Emotionally Abused: No    Physically Abused: No    Sexually Abused: No    Outpatient Medications Prior to Visit  Medication Sig Dispense Refill   acetaminophen (TYLENOL) 500 MG tablet Take 500 mg by mouth every 6 (six) hours as needed for moderate pain or headache.     apixaban (ELIQUIS) 5 MG TABS tablet TAKE 1 TABLET TWICE A DAY 180 tablet 1   certolizumab pegol (CIMZIA) 2 X 200 MG KIT Per Rheumatology (Patient taking differently: Inject 400 mg into the skin every 30 (thirty) days. Per Rheumatology) 1 kit 1   Cholecalciferol (VITAMIN D) 1000 UNITS capsule Take 1,000 Units by mouth daily.     dextromethorphan (DELSYM) 30 MG/5ML liquid Take 30 mg by mouth at bedtime as needed for cough.      Estradiol 10 MCG TABS Place 10 mcg vaginally 3 (three) times a week.     famotidine (PEPCID AC MAXIMUM STRENGTH) 20 MG tablet Take 20 mg by mouth at bedtime as needed.     fluticasone (FLONASE ALLERGY RELIEF) 50 MCG/ACT nasal spray Place 1 spray into both nostrils daily as needed for allergies.     folic acid (FOLVITE) 1 MG tablet Take 1 mg by mouth daily. Patient takes 2 capsules daily     guaiFENesin (MUCINEX) 600 MG 12 hr tablet Take 1 tablet every 12 hours by oral route.     Lactobacillus Rhamnosus, GG, (CULTURELLE PO) Take 1 tablet by mouth daily.     loratadine (CLARITIN) 10 MG tablet Take 10 mg by mouth daily.     methotrexate (RHEUMATREX) 2.5 MG tablet Take 15 mg by mouth once a week.     metoprolol succinate (TOPROL XL) 25 MG 24 hr tablet Take 1.5 tablets (37.5 mg total) by mouth in the morning and at bedtime. 270 tablet 3    metoprolol tartrate (LOPRESSOR) 25 MG tablet Take 0.5 tablets (12.5 mg total) by mouth daily as needed. 90 tablet 3  Multiple Vitamins-Minerals (CENTRUM SILVER) tablet See admin instructions.     Respiratory Therapy Supplies (FLUTTER) DEVI Twice a day and prn as needed, may increase if feeling worse 1 each 0   UNABLE TO FIND Take by mouth daily. Med Name: Calcium, magnesium and zinc     Zoledronic Acid (RECLAST IV) Inject into the vein. IV yearly     triamcinolone acetonide (KENALOG-40) 40 MG/ML injection Inject into the articular space.     No facility-administered medications prior to visit.    Allergies  Allergen Reactions   Amoxicillin Other (See Comments)   Moxifloxacin Other (See Comments)   Nitrofurantoin Other (See Comments)   Albuterol Other (See Comments)    Patient states put her into atrial fib last time she took this medication   Amoxicillin-Pot Clavulanate Diarrhea    Has patient had a PCN reaction causing immediate rash, facial/tongue/throat swelling, SOB or lightheadedness with hypotension:No Has patient had a PCN reaction causing severe rash involving mucus membranes or skin necrosis:No Has patient had a PCN reaction that required hospitalization:No Has patient had a PCN reaction occurring within the last 10 years:Yes If all of the above answers are "NO", then may proceed with Cephalosporin use   Avelox [Moxifloxacin Hcl In Nacl] Nausea Only   Benzonatate Other (See Comments)    felt bad, out of sorts, disoriented.   Ciprofloxacin Rash and Other (See Comments)    Rash across abdomen   Macrobid [Nitrofurantoin Macrocrystal] Diarrhea    Diarrhea, nausea   Sulfamethoxazole-Trimethoprim Other (See Comments)    flushing    Review of Systems  Gastrointestinal:  Positive for abdominal pain.       Objective:    Physical Exam Constitutional:      General: She is not in acute distress.    Appearance: She is not ill-appearing.  Eyes:     Extraocular Movements:  Extraocular movements intact.     Conjunctiva/sclera: Conjunctivae normal.  Cardiovascular:     Rate and Rhythm: Normal rate.  Pulmonary:     Effort: Pulmonary effort is normal.  Abdominal:     General: Bowel sounds are normal. There is no distension.     Palpations: Abdomen is soft.     Tenderness: There is no abdominal tenderness. There is no guarding or rebound.  Musculoskeletal:     Cervical back: Normal range of motion and neck supple.  Skin:    General: Skin is warm and dry.  Neurological:     General: No focal deficit present.     Mental Status: She is alert and oriented to person, place, and time.  Psychiatric:        Mood and Affect: Mood normal.        Behavior: Behavior normal.        Thought Content: Thought content normal.     BP 106/66 (BP Location: Left Arm, Patient Position: Sitting, Cuff Size: Normal)   Pulse 85   Temp 97.6 F (36.4 C) (Temporal)   Ht 5' 1.5" (1.562 m)   Wt 98 lb (44.5 kg)   SpO2 98%   BMI 18.22 kg/m  Wt Readings from Last 3 Encounters:  09/13/22 98 lb (44.5 kg)  09/04/22 100 lb 15.5 oz (45.8 kg)  09/02/22 101 lb (45.8 kg)       Assessment & Plan:   Problem List Items Addressed This Visit   None Visit Diagnoses     Lower abdominal pain    -  Primary   Relevant Orders  DG Abd 2 Views   Change in stool       Relevant Orders   DG Abd 2 Views   H/O constipation       Relevant Orders   DG Abd 2 Views      Abdominal plain film ordered for comparison to CT on Sep 04, 2022.  She is having bowel movements but changes in girth.  As long as her x-ray is negative for blockage, she will continue MiraLAX and may even increase this to twice daily.  Continue stool softeners, probiotic and fiber.  Reviewed recent ED visit including lab and imaging results. Follow-up if worsening or not improving in the next week.  I have discontinued Jasmine December A. Villafuerte "Sherry"'s triamcinolone acetonide. I am also having her maintain her Vitamin D,  Estradiol, (Lactobacillus Rhamnosus, GG, (CULTURELLE PO)), acetaminophen, dextromethorphan, Flutter, methotrexate, Centrum Silver, guaiFENesin, fluticasone, metoprolol tartrate, famotidine, loratadine, Cimzia, Zoledronic Acid (RECLAST IV), folic acid, metoprolol succinate, Eliquis, and UNABLE TO FIND.  No orders of the defined types were placed in this encounter.

## 2022-09-13 NOTE — Patient Instructions (Signed)
Please go downstairs for an x-ray of your abdomen before you leave.  You may continue MiraLAX, stool softeners, probiotic and fiber.  Make sure to stay well-hydrated.  We will be in touch with your x-ray result.  Follow-up if you are not improving by next week

## 2022-09-16 DIAGNOSIS — R5383 Other fatigue: Secondary | ICD-10-CM | POA: Diagnosis not present

## 2022-09-16 DIAGNOSIS — Z79899 Other long term (current) drug therapy: Secondary | ICD-10-CM | POA: Diagnosis not present

## 2022-09-16 DIAGNOSIS — M0579 Rheumatoid arthritis with rheumatoid factor of multiple sites without organ or systems involvement: Secondary | ICD-10-CM | POA: Diagnosis not present

## 2022-09-20 ENCOUNTER — Ambulatory Visit: Payer: Medicare Other | Attending: Cardiology

## 2022-09-20 DIAGNOSIS — I480 Paroxysmal atrial fibrillation: Secondary | ICD-10-CM

## 2022-09-21 LAB — CBC WITH DIFFERENTIAL/PLATELET
Basophils Absolute: 0.1 10*3/uL (ref 0.0–0.2)
Basos: 1 %
EOS (ABSOLUTE): 0.1 10*3/uL (ref 0.0–0.4)
Eos: 1 %
Hematocrit: 39.4 % (ref 34.0–46.6)
Hemoglobin: 13.2 g/dL (ref 11.1–15.9)
Immature Grans (Abs): 0 10*3/uL (ref 0.0–0.1)
Immature Granulocytes: 0 %
Lymphocytes Absolute: 1.5 10*3/uL (ref 0.7–3.1)
Lymphs: 14 %
MCH: 31.7 pg (ref 26.6–33.0)
MCHC: 33.5 g/dL (ref 31.5–35.7)
MCV: 95 fL (ref 79–97)
Monocytes Absolute: 1 10*3/uL — ABNORMAL HIGH (ref 0.1–0.9)
Monocytes: 10 %
Neutrophils Absolute: 7.4 10*3/uL — ABNORMAL HIGH (ref 1.4–7.0)
Neutrophils: 74 %
Platelets: 388 10*3/uL (ref 150–450)
RBC: 4.17 x10E6/uL (ref 3.77–5.28)
RDW: 12.7 % (ref 11.7–15.4)
WBC: 10 10*3/uL (ref 3.4–10.8)

## 2022-09-21 LAB — BASIC METABOLIC PANEL
BUN/Creatinine Ratio: 24 (ref 12–28)
BUN: 19 mg/dL (ref 8–27)
CO2: 22 mmol/L (ref 20–29)
Calcium: 9.1 mg/dL (ref 8.7–10.3)
Chloride: 101 mmol/L (ref 96–106)
Creatinine, Ser: 0.78 mg/dL (ref 0.57–1.00)
Glucose: 94 mg/dL (ref 70–99)
Potassium: 4.5 mmol/L (ref 3.5–5.2)
Sodium: 136 mmol/L (ref 134–144)
eGFR: 78 mL/min/{1.73_m2} (ref >59)

## 2022-10-02 ENCOUNTER — Telehealth (HOSPITAL_COMMUNITY): Payer: Self-pay | Admitting: *Deleted

## 2022-10-02 NOTE — Telephone Encounter (Signed)
Attempted to call patient regarding upcoming cardiac CT appointment. °Left message on voicemail with name and callback number ° °Sontee Desena RN Navigator Cardiac Imaging °[facility] °[phone] Office °[phone] Cell ° °

## 2022-10-02 NOTE — Telephone Encounter (Signed)
Reaching out to patient to offer assistance regarding upcoming cardiac imaging study; pt verbalizes understanding of appt date/time, parking situation and where to check in, pre-test NPO status, and verified current allergies; name and call back number provided for further questions should they arise  Dozier Berkovich RN Navigator Cardiac Imaging [facility] [phone] office [phone] cell  

## 2022-10-03 ENCOUNTER — Encounter (HOSPITAL_BASED_OUTPATIENT_CLINIC_OR_DEPARTMENT_OTHER): Payer: Self-pay

## 2022-10-03 ENCOUNTER — Ambulatory Visit (HOSPITAL_BASED_OUTPATIENT_CLINIC_OR_DEPARTMENT_OTHER)
Admission: RE | Admit: 2022-10-03 | Discharge: 2022-10-03 | Disposition: A | Discharge status: Home / Self Care | Payer: Medicare Other | Source: Ambulatory Visit | Attending: Cardiology | Admitting: Cardiology

## 2022-10-03 DIAGNOSIS — I480 Paroxysmal atrial fibrillation: Secondary | ICD-10-CM | POA: Insufficient documentation

## 2022-10-03 MED ORDER — IOHEXOL 350 MG/ML SOLN
100.0000 mL | Freq: Once | INTRAVENOUS | Status: AC | PRN
  Administered 2022-10-03: 80 mL via INTRAVENOUS

## 2022-10-09 NOTE — Pre-Procedure Instructions (Addendum)
Attempted to call patient regarding procedure instructions for tomorrow.  Left voicemail on the following items: Arrival time 0830 Nothing to eat or drink after midnight No meds AM of procedure Responsible person to drive you home and stay with you for 24 hrs  Have you missed any doses of anti-coagulant Eliquis- should be taken twice a day.  If you have missed any doses please let office know right away.  Don't take dose in the morning.

## 2022-10-10 ENCOUNTER — Other Ambulatory Visit: Payer: Self-pay

## 2022-10-10 ENCOUNTER — Other Ambulatory Visit (HOSPITAL_COMMUNITY): Payer: Self-pay

## 2022-10-10 ENCOUNTER — Encounter (HOSPITAL_COMMUNITY)
Admission: RE | Disposition: A | Discharge status: Home / Self Care | Payer: Medicare Other | Source: Home / Self Care | Attending: Cardiology

## 2022-10-10 ENCOUNTER — Encounter (HOSPITAL_COMMUNITY): Payer: Self-pay | Admitting: Cardiology

## 2022-10-10 ENCOUNTER — Ambulatory Visit (HOSPITAL_COMMUNITY)
Admission: RE | Admit: 2022-10-10 | Discharge: 2022-10-10 | Disposition: A | Discharge status: Home / Self Care | Payer: Medicare Other | Attending: Cardiology | Admitting: Cardiology

## 2022-10-10 ENCOUNTER — Ambulatory Visit (HOSPITAL_BASED_OUTPATIENT_CLINIC_OR_DEPARTMENT_OTHER): Payer: Medicare Other | Admitting: Anesthesiology

## 2022-10-10 ENCOUNTER — Ambulatory Visit (HOSPITAL_COMMUNITY): Payer: Medicare Other | Admitting: Anesthesiology

## 2022-10-10 DIAGNOSIS — I4891 Unspecified atrial fibrillation: Secondary | ICD-10-CM | POA: Diagnosis not present

## 2022-10-10 DIAGNOSIS — M069 Rheumatoid arthritis, unspecified: Secondary | ICD-10-CM | POA: Diagnosis not present

## 2022-10-10 DIAGNOSIS — I480 Paroxysmal atrial fibrillation: Secondary | ICD-10-CM | POA: Diagnosis not present

## 2022-10-10 DIAGNOSIS — J479 Bronchiectasis, uncomplicated: Secondary | ICD-10-CM

## 2022-10-10 DIAGNOSIS — Z7901 Long term (current) use of anticoagulants: Secondary | ICD-10-CM | POA: Diagnosis not present

## 2022-10-10 DIAGNOSIS — K5790 Diverticulosis of intestine, part unspecified, without perforation or abscess without bleeding: Secondary | ICD-10-CM | POA: Diagnosis not present

## 2022-10-10 DIAGNOSIS — M810 Age-related osteoporosis without current pathological fracture: Secondary | ICD-10-CM

## 2022-10-10 HISTORY — PX: ATRIAL FIBRILLATION ABLATION: EP1191

## 2022-10-10 LAB — POCT ACTIVATED CLOTTING TIME
Activated Clotting Time: 244 seconds
Activated Clotting Time: 293 seconds

## 2022-10-10 SURGERY — ATRIAL FIBRILLATION ABLATION
Anesthesia: General

## 2022-10-10 MED ORDER — PROPOFOL 10 MG/ML IV BOLUS
INTRAVENOUS | Status: DC | PRN
  Administered 2022-10-10: 100 mg via INTRAVENOUS

## 2022-10-10 MED ORDER — FENTANYL CITRATE (PF) 100 MCG/2ML IJ SOLN
INTRAMUSCULAR | Status: DC | PRN
  Administered 2022-10-10: 50 ug via INTRAVENOUS

## 2022-10-10 MED ORDER — DEXAMETHASONE SODIUM PHOSPHATE 4 MG/ML IJ SOLN
INTRAMUSCULAR | Status: DC | PRN
  Administered 2022-10-10: 5 mg via INTRAVENOUS

## 2022-10-10 MED ORDER — SODIUM CHLORIDE 0.9% FLUSH: 3.0000 mL | Freq: Two times a day (BID) | INTRAVENOUS | Status: DC

## 2022-10-10 MED ORDER — ROCURONIUM BROMIDE 10 MG/ML (PF) SYRINGE
PREFILLED_SYRINGE | INTRAVENOUS | Status: DC | PRN
  Administered 2022-10-10: 40 mg via INTRAVENOUS

## 2022-10-10 MED ORDER — ACETAMINOPHEN 500 MG PO TABS
1000.0000 mg | ORAL_TABLET | Freq: Once | ORAL | Status: AC
  Administered 2022-10-10: 1000 mg via ORAL
  Filled 2022-10-10: qty 2

## 2022-10-10 MED ORDER — SODIUM CHLORIDE 0.9 % IV SOLN: 250.0000 mL | INTRAVENOUS | Status: DC | PRN

## 2022-10-10 MED ORDER — COLCHICINE 0.6 MG PO TABS
0.6000 mg | ORAL_TABLET | Freq: Two times a day (BID) | ORAL | Status: DC
  Administered 2022-10-10: 0.6 mg via ORAL
  Filled 2022-10-10 (×2): qty 1

## 2022-10-10 MED ORDER — PANTOPRAZOLE SODIUM 40 MG PO TBEC
40.0000 mg | DELAYED_RELEASE_TABLET | Freq: Every day | ORAL | 0 refills | Status: DC
  Filled 2022-10-10: qty 45, 45d supply, fill #0

## 2022-10-10 MED ORDER — HEPARIN SODIUM (PORCINE) 1000 UNIT/ML IJ SOLN
INTRAMUSCULAR | Status: DC | PRN
  Administered 2022-10-10: 7000 [IU] via INTRAVENOUS
  Administered 2022-10-10: 3000 [IU] via INTRAVENOUS
  Administered 2022-10-10: 4000 [IU] via INTRAVENOUS

## 2022-10-10 MED ORDER — SODIUM CHLORIDE 0.9 % IV SOLN: INTRAVENOUS | Status: DC

## 2022-10-10 MED ORDER — ONDANSETRON HCL 4 MG/2ML IJ SOLN
INTRAMUSCULAR | Status: DC | PRN
  Administered 2022-10-10: 4 mg via INTRAVENOUS

## 2022-10-10 MED ORDER — ONDANSETRON HCL 4 MG/2ML IJ SOLN: 4.0000 mg | Freq: Four times a day (QID) | INTRAMUSCULAR | Status: DC | PRN

## 2022-10-10 MED ORDER — HEPARIN SODIUM (PORCINE) 1000 UNIT/ML IJ SOLN
INTRAMUSCULAR | Status: DC | PRN
  Administered 2022-10-10: 1000 [IU] via INTRAVENOUS

## 2022-10-10 MED ORDER — SUGAMMADEX SODIUM 200 MG/2ML IV SOLN
INTRAVENOUS | Status: DC | PRN
  Administered 2022-10-10: 100 mg via INTRAVENOUS

## 2022-10-10 MED ORDER — SODIUM CHLORIDE 0.9% FLUSH: 3.0000 mL | INTRAVENOUS | Status: DC | PRN

## 2022-10-10 MED ORDER — PANTOPRAZOLE SODIUM 40 MG PO TBEC
40.0000 mg | DELAYED_RELEASE_TABLET | Freq: Every day | ORAL | Status: DC
  Administered 2022-10-10: 40 mg via ORAL
  Filled 2022-10-10 (×2): qty 1

## 2022-10-10 MED ORDER — PHENYLEPHRINE HCL-NACL 20-0.9 MG/250ML-% IV SOLN
INTRAVENOUS | Status: DC | PRN
  Administered 2022-10-10: 35 ug/min via INTRAVENOUS

## 2022-10-10 MED ORDER — ACETAMINOPHEN 325 MG PO TABS: 650.0000 mg | ORAL_TABLET | ORAL | Status: DC | PRN

## 2022-10-10 MED ORDER — HEPARIN (PORCINE) IN NACL 1000-0.9 UT/500ML-% IV SOLN
INTRAVENOUS | Status: DC | PRN
  Administered 2022-10-10 (×4): 500 mL

## 2022-10-10 MED ORDER — PROTAMINE SULFATE 10 MG/ML IV SOLN
INTRAVENOUS | Status: DC | PRN
  Administered 2022-10-10: 35 mg via INTRAVENOUS

## 2022-10-10 MED ORDER — COLCHICINE 0.6 MG PO TABS
0.6000 mg | ORAL_TABLET | Freq: Two times a day (BID) | ORAL | 0 refills | Status: DC
  Filled 2022-10-10: qty 10, 5d supply, fill #0

## 2022-10-10 MED ORDER — HEPARIN SODIUM (PORCINE) 1000 UNIT/ML IJ SOLN
INTRAMUSCULAR | Status: AC
  Filled 2022-10-10: qty 10

## 2022-10-10 MED ORDER — APIXABAN 5 MG PO TABS
5.0000 mg | ORAL_TABLET | Freq: Two times a day (BID) | ORAL | Status: DC
  Administered 2022-10-10: 5 mg via ORAL
  Filled 2022-10-10 (×3): qty 1

## 2022-10-10 SURGICAL SUPPLY — 18 items
BLANKET WARM UNDERBOD FULL ACC (MISCELLANEOUS) ×1 IMPLANT
CATH ABLAT QDOT MICRO BI TC DF (CATHETERS) IMPLANT
CATH OCTARAY 2.0 F 3-3-3-3-3 (CATHETERS) IMPLANT
CATH S-M CIRCA TEMP PROBE (CATHETERS) IMPLANT
CATH SOUNDSTAR ECO 8FR (CATHETERS) IMPLANT
CATH WEB BI DIR CSDF CRV REPRO (CATHETERS) IMPLANT
CLOSURE PERCLOSE PROSTYLE (VASCULAR PRODUCTS) IMPLANT
COVER SWIFTLINK CONNECTOR (BAG) ×1 IMPLANT
PACK EP LATEX FREE (CUSTOM PROCEDURE TRAY) ×1
PACK EP LF (CUSTOM PROCEDURE TRAY) ×1 IMPLANT
PAD DEFIB RADIO PHYSIO CONN (PAD) ×1 IMPLANT
PATCH CARTO3 (PAD) IMPLANT
SHEATH BAYLIS TRANSSEPTAL 98CM (NEEDLE) IMPLANT
SHEATH CARTO VIZIGO SM CVD (SHEATH) IMPLANT
SHEATH PINNACLE 8F 10CM (SHEATH) IMPLANT
SHEATH PINNACLE 9F 10CM (SHEATH) IMPLANT
SHEATH PROBE COVER 6X72 (BAG) IMPLANT
TUBING SMART ABLATE COOLFLOW (TUBING) IMPLANT

## 2022-10-10 NOTE — H&P (Signed)
Electrophysiology Office Follow up Visit Note:     Date:  10/10/2022    ID:  Monica [name], DOB [birth year], MRN [id number]   PCP:  Tresa Garter, MD           [facility] Cardiologist:  Thurmon Fair, MD  [facility] Electrophysiologist:  Lanier Prude, MD      Interval History:     Monica [name] is a 77 y.o. female who presents for a follow up visit. They were last seen in clinic 06/19/2021 for pAF. At that appointment we discussed PVI and thought that would be the best long term option. Unfortunately she was on high dose steroids at that visit and we wanted to wait until she was off the steroids to minimize periprocedural risk.    Since that visit she has started MTX and has been able to discontinue prednisone. She continues to take eliquis for stroke ppx. She presents to reconsider PVI.    Today, she endorses palpitations when in A fib and when having PVCs. She has bene using her Kardia monitor when she has these episodes.    She confirmed stopping steroids. She has been taking Cimzia 2 200mg  injections every month with rheumatology.    She is receptive to considering ablation.    She reports she had Covid last year and continues to have a lingering cough.    She denies any chest pain, shortness of breath, or peripheral edema. No lightheadedness, headaches, syncope, orthopnea, or PND.  Presents for PVI today. Procedure reviewed.           Past Medical History:  Diagnosis Date   Adenomatous colon polyp 10/2003   Allergic rhinitis     Allergy      SEASONAL   Bronchiectasis     Chronic sinusitis     Diverticulosis     GERD (gastroesophageal reflux disease)     Hearing loss     Hemorrhoids     Insomnia     Menopausal disorder     Osteoporosis     Palpitations     Paroxysmal A-fib (HCC)     Rheumatoid arthritis (HCC)     TMJ syndrome     Urticaria             Past Surgical History:  Procedure Laterality Date   COLONOSCOPY       NASAL SINUS  SURGERY       TYMPANOSTOMY       VIDEO BRONCHOSCOPY Bilateral 07/16/2016    Procedure: VIDEO BRONCHOSCOPY WITH FLUORO;  Surgeon: Leslye Peer, MD;  Location: [facility];  Service: Cardiopulmonary;  Laterality: Bilateral;      Current Medications: Active Medications      Current Meds  Medication Sig   acetaminophen (TYLENOL) 500 MG tablet Take 500 mg by mouth every 6 (six) hours as needed for moderate pain or headache.   apixaban (ELIQUIS) 5 MG TABS tablet TAKE 1 TABLET TWICE A DAY   calcium carbonate (TUMS EX) 750 MG chewable tablet Chew 1,500 mg by mouth at bedtime.   CALCIUM-MAGNESIUM-ZINC PO Take 500 mg by mouth daily.   certolizumab pegol (CIMZIA) 2 X 200 MG KIT Per Rheumatology (Patient taking differently: every 30 (thirty) days. Per Rheumatology)   Cholecalciferol (VITAMIN D) 1000 UNITS capsule Take 1,000 Units by mouth daily.   dextromethorphan (DELSYM) 30 MG/5ML liquid Take 30 mg by mouth at bedtime as needed for cough.    Estradiol 10 MCG TABS Place 10 mcg  vaginally 3 (three) times a week.   famotidine (PEPCID AC MAXIMUM STRENGTH) 20 MG tablet 2 tablets at bedtime as needed Orally Once a day   fluticasone (FLONASE ALLERGY RELIEF) 50 MCG/ACT nasal spray     folic acid (FOLVITE) 1 MG tablet Take 1 mg by mouth daily. Patient takes 2 capsules daily   guaiFENesin (MUCINEX) 600 MG 12 hr tablet Take 1 tablet every 12 hours by oral route.   Lactobacillus Rhamnosus, GG, (CULTURELLE PO) Take 1 tablet by mouth daily.   loratadine (CLARITIN) 10 MG tablet Take 10 mg by mouth daily.   methotrexate (RHEUMATREX) 2.5 MG tablet Take 10 mg by mouth once a week.   metoprolol succinate (TOPROL XL) 25 MG 24 hr tablet Take 1.5 tablets (37.5 mg total) by mouth in the morning and at bedtime.   metoprolol tartrate (LOPRESSOR) 25 MG tablet Take 0.5 tablets (12.5 mg total) by mouth daily as needed.   Multiple Vitamins-Minerals (CENTRUM SILVER) tablet See admin instructions.   Respiratory Therapy  Supplies (FLUTTER) DEVI Twice a day and prn as needed, may increase if feeling worse   traMADol (ULTRAM) 50 MG tablet Take 1 tablet by mouth every 8 (eight) hours as needed.   Zoledronic Acid (RECLAST IV) Inject into the vein. IV yearly        Allergies:   Amoxicillin, Moxifloxacin, Nitrofurantoin, Albuterol, Amoxicillin-pot clavulanate, Avelox [moxifloxacin hcl in nacl], Benzonatate, Ciprofloxacin, Macrobid [nitrofurantoin macrocrystal], and Sulfamethoxazole-trimethoprim    Social History         Socioeconomic History   Marital status: Widowed      Spouse name: Nadine Counts   Number of children: 0   Years of education: BSN   Highest education level: Not on file  Occupational History   Occupation: retired Charity fundraiser  Tobacco Use   Smoking status: Never   Smokeless tobacco: Never  Vaping Use   Vaping Use: Never used  Substance and Sexual Activity   Alcohol use: Yes      Alcohol/week: 1.0 - 2.0 standard drink of alcohol      Types: 1 - 2 Glasses of wine per week      Comment: 1-2 glasses of wine a month 05/29/21   Drug use: No   Sexual activity: Not Currently  Other Topics Concern   Not on file  Social History Narrative    ** Merged History Encounter **         Lives with husband Caffeine use: 1 cup tea per day    Social Determinants of Health        Financial Resource Strain: Low Risk  (07/17/2021)    Overall Financial Resource Strain (CARDIA)     Difficulty of Paying Living Expenses: Not hard at all  Food Insecurity: No Food Insecurity (07/17/2021)    Hunger Vital Sign     Worried About Running Out of Food in the Last Year: Never true     Ran Out of Food in the Last Year: Never true  Transportation Needs: No Transportation Needs (07/17/2021)    PRAPARE - Therapist, art (Medical): No     Lack of Transportation (Non-Medical): No  Physical Activity: Insufficiently Active (07/17/2021)    Exercise Vital Sign     Days of Exercise per Week: 7 days     Minutes of  Exercise per Session: 20 min  Stress: No Stress Concern Present (07/17/2021)    [employer] of Occupational Health - Occupational Stress Questionnaire  Feeling of Stress : Not at all  Social Connections: Unknown (07/17/2021)    Social Connection and Isolation Panel [NHANES]     Frequency of Communication with Friends and Family: More than three times a week     Frequency of Social Gatherings with Friends and Family: Once a week     Attends Religious Services: Patient refused     Active Member of Clubs or Organizations: Patient refused     Attends Banker Meetings: Patient refused     Marital Status: Widowed      Family History: The patient's family history includes AVM in her brother; Cancer in her maternal grandmother, mother, and paternal uncle; Colon cancer (age of onset: 57) in her mother; Heart disease in her father and maternal grandfather; Hypertension in her father; Rectal cancer in her mother; Seizures in her mother. There is no history of Esophageal cancer.   ROS:   Please see the history of present illness.   (+) Palpitations   All other systems reviewed and are negative.   EKGs/Labs/Other Studies Reviewed:     The following studies were reviewed today:   05/01/2022 Zio AF burden 3%     Recent Labs: 09/12/2021: ALT 13; BUN 17; Creatinine, Ser 0.76; Hemoglobin 12.2; Platelets 453; Potassium 3.8; Sodium 135  Recent Lipid Panel Labs (Brief)          Component Value Date/Time    CHOL 140 07/06/2018 0755    TRIG 41.0 07/06/2018 0755    HDL 62.80 07/06/2018 0755    CHOLHDL 2 07/06/2018 0755    VLDL 8.2 07/06/2018 0755    LDLCALC 69 07/06/2018 0755        Physical Exam:     VS:  BP 117/70   Pulse 78   Ht 5' 1.5" (1.562 m)   Wt 100 lb 3.2 oz (45.5 kg)   SpO2 97%   BMI 18.63 kg/m         Wt Readings from Last 3 Encounters:  06/14/22 100 lb 3.2 oz (45.5 kg)  04/04/22 103 lb 9.6 oz (47 kg)  03/27/22 105 lb (47.6 kg)      GEN:  Well  nourished, well developed in no acute distress.  Thin CARDIAC: RRR, no murmurs, rubs, gallops           Assessment ASSESSMENT:     1. Paroxysmal atrial fibrillation (HCC)     PLAN:     In order of problems listed above:   #pAF Continue eliquis.   Discussed treatment options today for their AF including antiarrhythmic drug therapy and ablation. Discussed risks, recovery and likelihood of success. Discussed potential need for repeat ablation procedures and antiarrhythmic drugs after an initial ablation. They wish to proceed with scheduling.   Risk, benefits, and alternatives to EP study and radiofrequency ablation for afib were also discussed in detail today. These risks include but are not limited to stroke, bleeding, vascular damage, tamponade, perforation, damage to the esophagus, lungs, and other structures, pulmonary vein stenosis, worsening renal function, and death. The patient understands these risk and wishes to proceed.  We will therefore proceed with catheter ablation at the next available time.  Carto, ICE, anesthesia are requested for the procedure.  Will also obtain CT PV protocol prior to the procedure to exclude LAA thrombus and further evaluate atrial anatomy.     Presents for PVI today. Procedure reviewed.   Signed, Steffanie Dunn, MD, [facility], [facility] 10/10/2022 Electrophysiology [facility]

## 2022-10-10 NOTE — Anesthesia Preprocedure Evaluation (Addendum)
Anesthesia Evaluation  Patient identified by MRN, date of birth, ID band Patient awake    Reviewed: Allergy & Precautions, NPO status , Patient's Chart, lab work & pertinent test results  History of Anesthesia Complications Negative for: history of anesthetic complications  Airway Mallampati: III  TM Distance: >3 FB Neck ROM: Full    Dental  (+) Dental Advisory Given,  Caps on top front teeth, bridges:   Pulmonary neg shortness of breath, neg sleep apnea, neg COPD, neg recent URI bronchiectasis   Pulmonary exam normal breath sounds clear to auscultation       Cardiovascular (-) hypertension(-) angina (-) Past MI, (-) Cardiac Stents and (-) CABG + dysrhythmias (PACs) Atrial Fibrillation  Rhythm:Regular Rate:Normal  TTE 01/29/2022: IMPRESSIONS     1. Left ventricular ejection fraction, by estimation, is 60 to 65%. The  left ventricle has normal function. The left ventricle has no regional  wall motion abnormalities. Left ventricular diastolic parameters are  consistent with Grade I diastolic  dysfunction (impaired relaxation).   2. Right ventricular systolic function is normal. The right ventricular  size is normal.   3. The mitral valve is normal in structure. No evidence of mitral valve  regurgitation. No evidence of mitral stenosis.   4. The aortic valve is normal in structure. Aortic valve regurgitation is  trivial. No aortic stenosis is present.   5. The inferior vena cava is normal in size with greater than 50%  respiratory variability, suggesting right atrial pressure of 3 mmHg.     Neuro/Psych neg Seizures negative neurological ROS     GI/Hepatic Neg liver ROS,GERD  Poorly Controlled and Medicated,,diverticulosis   Endo/Other  negative endocrine ROS    Renal/GU negative Renal ROS     Musculoskeletal  (+) Arthritis , Rheumatoid disorders,  osteoporosis   Abdominal   Peds  Hematology negative hematology  ROS (+)   Anesthesia Other Findings   Reproductive/Obstetrics                             Anesthesia Physical Anesthesia Plan  ASA: 3  Anesthesia Plan: General   Post-op Pain Management: Tylenol PO (pre-op)*   Induction: Intravenous and Rapid sequence  PONV Risk Score and Plan: 3 and Ondansetron, Dexamethasone and Treatment may vary due to age or medical condition  Airway Management Planned: Oral ETT  Additional Equipment:   Intra-op Plan:   Post-operative Plan: Extubation in OR  Informed Consent: I have reviewed the patients History and Physical, chart, labs and discussed the procedure including the risks, benefits and alternatives for the proposed anesthesia with the patient or authorized representative who has indicated his/her understanding and acceptance.     Dental advisory given  Plan Discussed with: CRNA and Anesthesiologist  Anesthesia Plan Comments: (Risks of general anesthesia discussed including, but not limited to, sore throat, hoarse voice, chipped/damaged teeth, injury to vocal cords, nausea and vomiting, allergic reactions, lung infection, heart attack, stroke, and death. All questions answered. )        Anesthesia Quick Evaluation

## 2022-10-10 NOTE — Discharge Instructions (Signed)

## 2022-10-10 NOTE — Anesthesia Procedure Notes (Signed)
Procedure Name: Intubation Date/Time: 10/10/2022 10:32 AM  Performed by: Quentin Ore, CRNAPre-anesthesia Checklist: Patient identified, Emergency Drugs available, Suction available and Patient being monitored Patient Re-evaluated:Patient Re-evaluated prior to induction Oxygen Delivery Method: Circle System Utilized Preoxygenation: Pre-oxygenation with 100% oxygen Induction Type: IV induction Ventilation: Mask ventilation without difficulty Laryngoscope Size: Miller and 2 Grade View: Grade III Tube type: Oral Tube size: 6.5 mm Number of attempts: 2 Airway Equipment and Method: Bougie stylet Placement Confirmation: ETT inserted through vocal cords under direct vision, positive ETCO2 and breath sounds checked- equal and bilateral Secured at: 20 cm Tube secured with: Tape Dental Injury: Teeth and Oropharynx as per pre-operative assessment  Difficulty Due To: Difficult Airway- due to limited oral opening and Difficult Airway- due to anterior larynx

## 2022-10-10 NOTE — Anesthesia Postprocedure Evaluation (Signed)
Anesthesia Post Note  Patient: Monica [name]  Procedure(s) Performed: ATRIAL FIBRILLATION ABLATION     Patient location during evaluation: PACU Anesthesia Type: General Level of consciousness: awake Pain management: pain level controlled Vital Signs Assessment: post-procedure vital signs reviewed and stable Respiratory status: spontaneous breathing, nonlabored ventilation and respiratory function stable Cardiovascular status: blood pressure returned to baseline and stable Postop Assessment: no apparent nausea or vomiting Anesthetic complications: no   No notable events documented.  Last Vitals:  Vitals:   10/10/22 1235 10/10/22 1240  BP: 123/67 121/68  Pulse: 80 80  Resp: 14 13  Temp:    SpO2: 97% 97%    Last Pain:  Vitals:   10/10/22 1252  TempSrc:   PainSc: 0-No pain   Pain Goal:                   Linton Rump

## 2022-10-10 NOTE — Transfer of Care (Signed)
Immediate Anesthesia Transfer of Care Note  Patient: Monica [name]  Procedure(s) Performed: ATRIAL FIBRILLATION ABLATION  Patient Location: Cath Lab  Anesthesia Type:General  Level of Consciousness: awake  Airway & Oxygen Therapy: Patient Spontanous Breathing and Patient connected to nasal cannula oxygen  Post-op Assessment: Report given to RN and Post -op Vital signs reviewed and stable  Post vital signs: Reviewed and stable  Last Vitals:  Vitals Value Taken Time  BP 108/76 10/10/22 1217  Temp 36.4 C 10/10/22 1217  Pulse 83 10/10/22 1220  Resp 15 10/10/22 1220  SpO2 99 % 10/10/22 1220  Vitals shown include unvalidated device data.  Last Pain:  Vitals:   10/10/22 1217  TempSrc: Temporal  PainSc: 0-No pain         Complications: No notable events documented.

## 2022-10-11 ENCOUNTER — Encounter (HOSPITAL_COMMUNITY): Payer: Self-pay | Admitting: Cardiology

## 2022-10-11 ENCOUNTER — Encounter: Payer: Self-pay | Admitting: Cardiology

## 2022-10-12 ENCOUNTER — Telehealth: Payer: Self-pay | Admitting: Cardiology

## 2022-10-12 NOTE — Telephone Encounter (Signed)
On-call pager line:  Patient was recently seen on 10/10/2022 for ablation.  Postoperatively patient has been doing fine however has noted constipation for the last 2 days.  She denies any abdominal pain, inability to urinate. Patient has tried increasing hydration, fiber, and has attempted digital impaction.  She called requesting more information about what would be safe.  I firstly recommended emergency evaluation if she had any concerning signs of prolonged constipation, lack of bowel movement, abdominal pain.  I also gave her a second suggestion of increasing water intake, getting 25 to 30 g of fiber, and possibly an enema.  Patient will attempt to use the strategies and will go to the ER if unsuccessful.  She was advised not to bear down and if any concerning signs arise to seek emergency evaluation.

## 2022-10-14 ENCOUNTER — Encounter: Payer: Self-pay | Admitting: Cardiology

## 2022-10-17 DIAGNOSIS — H43813 Vitreous degeneration, bilateral: Secondary | ICD-10-CM | POA: Diagnosis not present

## 2022-10-17 DIAGNOSIS — H31001 Unspecified chorioretinal scars, right eye: Secondary | ICD-10-CM | POA: Diagnosis not present

## 2022-10-17 DIAGNOSIS — H5310 Unspecified subjective visual disturbances: Secondary | ICD-10-CM | POA: Diagnosis not present

## 2022-10-17 DIAGNOSIS — G43909 Migraine, unspecified, not intractable, without status migrainosus: Secondary | ICD-10-CM | POA: Diagnosis not present

## 2022-10-19 ENCOUNTER — Telehealth: Payer: Self-pay | Admitting: Cardiology

## 2022-10-19 NOTE — Telephone Encounter (Signed)
Patient called in reporting ongoing episodes both constipation and diarrhea since being on colchicine and Protonix after having an ablation on 6/6. Recently stopped her colchicine as a result of diarrhea, but now complaining of constipation. Feels this is related to the Protonix. I advised if she feels like she is struggling with remaining Protonix then ok to stop. She voiced understanding

## 2022-10-21 DIAGNOSIS — M0579 Rheumatoid arthritis with rheumatoid factor of multiple sites without organ or systems involvement: Secondary | ICD-10-CM | POA: Diagnosis not present

## 2022-10-22 ENCOUNTER — Ambulatory Visit (INDEPENDENT_AMBULATORY_CARE_PROVIDER_SITE_OTHER): Payer: Medicare Other | Admitting: Internal Medicine

## 2022-10-22 ENCOUNTER — Encounter: Payer: Self-pay | Admitting: Internal Medicine

## 2022-10-22 VITALS — BP 102/64 | HR 100 | Temp 98.2°F | Ht 61.5 in | Wt 101.0 lb

## 2022-10-22 DIAGNOSIS — G4700 Insomnia, unspecified: Secondary | ICD-10-CM | POA: Diagnosis not present

## 2022-10-22 DIAGNOSIS — R197 Diarrhea, unspecified: Secondary | ICD-10-CM | POA: Diagnosis not present

## 2022-10-22 DIAGNOSIS — M059 Rheumatoid arthritis with rheumatoid factor, unspecified: Secondary | ICD-10-CM | POA: Diagnosis not present

## 2022-10-22 DIAGNOSIS — I480 Paroxysmal atrial fibrillation: Secondary | ICD-10-CM

## 2022-10-22 NOTE — Assessment & Plan Note (Addendum)
s/p ablation on 10/10/22 In [facility] Labs w/Dr Nickola Major

## 2022-10-22 NOTE — Assessment & Plan Note (Signed)
Not sleeping well. 

## 2022-10-22 NOTE — Assessment & Plan Note (Addendum)
On  Cimzia and methotrexate Labs

## 2022-10-22 NOTE — Progress Notes (Signed)
Subjective:  Patient ID: Monica [name], female    DOB: Jun 19, 1945  Age: 77 y.o. MRN: [id number]  CC: Constipation   HPI Monica [name] presents for s/p ablation on 10/10/22 C/o diarrhea from Colchicine x7 doses for chest inflammation C/o constipation now In [facility]    Outpatient Medications Prior to Visit  Medication Sig Dispense Refill   acetaminophen (TYLENOL) 500 MG tablet Take 500 mg by mouth every 6 (six) hours as needed for moderate pain or headache.     apixaban (ELIQUIS) 5 MG TABS tablet TAKE 1 TABLET TWICE A DAY 180 tablet 1   calcium carbonate (TUMS EX) 750 MG chewable tablet Chew 1 tablet by mouth at bedtime as needed for heartburn.     Calcium-Magnesium-Zinc (CAL-MAG-ZINC PO) Take 1 tablet by mouth daily.     certolizumab pegol (CIMZIA) 2 X 200 MG KIT Per Rheumatology (Patient taking differently: Inject 400 mg into the skin every 30 (thirty) days. Per Rheumatology) 1 kit 1   Cholecalciferol (VITAMIN D) 1000 UNITS capsule Take 1,000 Units by mouth daily with lunch.     dextromethorphan (DELSYM) 30 MG/5ML liquid Take 30 mg by mouth at bedtime as needed for cough.      Estradiol 10 MCG TABS Place 10 mcg vaginally 3 (three) times a week.     fluticasone (FLONASE ALLERGY RELIEF) 50 MCG/ACT nasal spray Place 1 spray into both nostrils at bedtime.     folic acid (FOLVITE) 1 MG tablet Take 2 mg by mouth daily with lunch.     guaiFENesin (MUCINEX) 600 MG 12 hr tablet Take 600 mg by mouth 2 (two) times daily as needed (congestion/cough).     Hydrocortisone (CORTIZONE-10 EX) Apply 1 Application topically 3 (three) times daily as needed (itching/bug bites).     Lactobacillus-Inulin (CULTURELLE DIGESTIVE HEALTH PO) Take 1 capsule by mouth in the morning.     loratadine (CLARITIN) 10 MG tablet Take 10 mg by mouth in the morning.     methotrexate (RHEUMATREX) 2.5 MG tablet Take 15 mg by mouth every Sunday. 6 tablets     metoprolol succinate (TOPROL XL) 25 MG 24 hr tablet Take 1.5 tablets  (37.5 mg total) by mouth in the morning and at bedtime. 270 tablet 3   metoprolol tartrate (LOPRESSOR) 25 MG tablet Take 0.5 tablets (12.5 mg total) by mouth daily as needed. 90 tablet 3   Multiple Vitamin (MULTIVITAMIN WITH MINERALS) TABS tablet Take 1 tablet by mouth daily with supper.     pantoprazole (PROTONIX) 40 MG tablet Take 1 tablet (40 mg total) by mouth daily. 45 tablet 0   Polyethyl Glycol-Propyl Glycol (SYSTANE ULTRA) 0.4-0.3 % SOLN Place 1 drop into both eyes 3 (three) times daily as needed.     Respiratory Therapy Supplies (FLUTTER) DEVI Twice a day and prn as needed, may increase if feeling worse 1 each 0   Zoledronic Acid (RECLAST IV) Inject into the vein. IV yearly     No facility-administered medications prior to visit.    ROS: Review of Systems  Constitutional:  Negative for activity change, appetite change, chills, fatigue and unexpected weight change.  HENT:  Negative for congestion, mouth sores and sinus pressure.   Eyes:  Negative for visual disturbance.  Respiratory:  Negative for cough and chest tightness.   Gastrointestinal:  Negative for abdominal pain and nausea.  Genitourinary:  Negative for difficulty urinating, frequency and vaginal pain.  Musculoskeletal:  Negative for arthralgias, back pain and gait problem.  Skin:  Negative for pallor and rash.  Neurological:  Negative for dizziness, tremors, weakness, numbness and headaches.  Psychiatric/Behavioral:  Negative for confusion, sleep disturbance and suicidal ideas. The patient is nervous/anxious.     Objective:  BP 102/64 (BP Location: Right Arm, Patient Position: Sitting, Cuff Size: Large)   Pulse 100   Temp 98.2 F (36.8 C) (Oral)   Ht 5' 1.5" (1.562 m)   Wt 101 lb (45.8 kg)   SpO2 96%   BMI 18.77 kg/m   BP Readings from Last 3 Encounters:  10/22/22 102/64  10/10/22 (!) 107/58  09/13/22 106/66    Wt Readings from Last 3 Encounters:  10/22/22 101 lb (45.8 kg)  10/10/22 100 lb (45.4 kg)   09/13/22 98 lb (44.5 kg)    Physical Exam Constitutional:      General: She is not in acute distress.    Appearance: She is well-developed.  HENT:     Head: Normocephalic.     Right Ear: External ear normal.     Left Ear: External ear normal.     Nose: Nose normal.  Eyes:     General:        Right eye: No discharge.        Left eye: No discharge.     Conjunctiva/sclera: Conjunctivae normal.     Pupils: Pupils are equal, round, and reactive to light.  Neck:     Thyroid: No thyromegaly.     Vascular: No JVD.     Trachea: No tracheal deviation.  Cardiovascular:     Rate and Rhythm: Normal rate and regular rhythm.     Heart sounds: Normal heart sounds.  Pulmonary:     Effort: No respiratory distress.     Breath sounds: No stridor. No wheezing.  Abdominal:     General: Bowel sounds are normal. There is no distension.     Palpations: Abdomen is soft. There is no mass.     Tenderness: There is no abdominal tenderness. There is no guarding or rebound.  Musculoskeletal:        General: No tenderness.     Cervical back: Normal range of motion and neck supple. No rigidity.  Lymphadenopathy:     Cervical: No cervical adenopathy.  Skin:    Findings: No erythema or rash.  Neurological:     Mental Status: She is oriented to person, place, and time.     Cranial Nerves: No cranial nerve deficit.     Motor: No abnormal muscle tone.     Coordination: Coordination normal.     Deep Tendon Reflexes: Reflexes normal.  Psychiatric:        Behavior: Behavior normal.        Thought Content: Thought content normal.        Judgment: Judgment normal.     Lab Results  Component Value Date   WBC 10.0 09/20/2022   HGB 13.2 09/20/2022   HCT 39.4 09/20/2022   PLT 388 09/20/2022   GLUCOSE 94 09/20/2022   CHOL 140 07/06/2018   TRIG 41.0 07/06/2018   HDL 62.80 07/06/2018   LDLCALC 69 07/06/2018   ALT 11 09/04/2022   AST 16 09/04/2022   NA 136 09/20/2022   K 4.5 09/20/2022   CL 101  09/20/2022   CREATININE 0.78 09/20/2022   BUN 19 09/20/2022   CO2 22 09/20/2022   TSH 3.09 05/30/2021    EP STUDY  Result Date: 10/10/2022 CONCLUSIONS: 1. Successful PVI 2. Successful ablation/isolation of the posterior wall  3. Intracardiac echo reveals trivial pericardial effusion, normal left atrial architecture 4. No early apparent complications. 5.  Colchicine 0.6 mg by mouth twice daily for 5 days 6.  Protonix 40 mg by mouth once daily for 45 days    Assessment & Plan:   Problem List Items Addressed This Visit     Insomnia disorder    Not sleeping well      Paroxysmal atrial fibrillation (HCC) - Primary    s/p ablation on 10/10/22      Rheumatoid arthritis (HCC)    On  Cimzia and methotrexate Labs      Diarrhea     due to Colchicine         No orders of the defined types were placed in this encounter.     Follow-up: Return in about 3 months (around 01/22/2023) for a follow-up visit.    Sonda Primes, MD

## 2022-10-22 NOTE — Assessment & Plan Note (Signed)
due to Colchicine

## 2022-10-25 DIAGNOSIS — H2513 Age-related nuclear cataract, bilateral: Secondary | ICD-10-CM | POA: Diagnosis not present

## 2022-10-25 DIAGNOSIS — H524 Presbyopia: Secondary | ICD-10-CM | POA: Diagnosis not present

## 2022-11-01 ENCOUNTER — Other Ambulatory Visit: Payer: Self-pay | Admitting: Cardiovascular Disease

## 2022-11-01 DIAGNOSIS — I480 Paroxysmal atrial fibrillation: Secondary | ICD-10-CM

## 2022-11-01 NOTE — Telephone Encounter (Signed)
Prescription refill request for Eliquis received. Indication:afib Last office visit:3/24 Scr:0.78  5/24 Age: 77 Weight:45.8  kg  Prescription refilled

## 2022-11-08 ENCOUNTER — Ambulatory Visit (HOSPITAL_COMMUNITY)
Admission: RE | Admit: 2022-11-08 | Discharge: 2022-11-08 | Disposition: A | Discharge status: Home / Self Care | Payer: Medicare Other | Source: Ambulatory Visit | Attending: Physician Assistant | Admitting: Physician Assistant

## 2022-11-08 ENCOUNTER — Encounter (HOSPITAL_COMMUNITY): Payer: Self-pay | Admitting: Physician Assistant

## 2022-11-08 VITALS — BP 102/60 | HR 93 | Ht 61.5 in | Wt 101.6 lb

## 2022-11-08 DIAGNOSIS — D6869 Other thrombophilia: Secondary | ICD-10-CM

## 2022-11-08 DIAGNOSIS — I480 Paroxysmal atrial fibrillation: Secondary | ICD-10-CM

## 2022-11-08 DIAGNOSIS — Z7901 Long term (current) use of anticoagulants: Secondary | ICD-10-CM | POA: Diagnosis not present

## 2022-11-08 NOTE — Progress Notes (Signed)
Primary Care Physician: Tresa Garter, MD Primary Cardiologist: Dr Royann Shivers  Primary Electrophysiologist: Dr Lalla Brothers  Referring Physician: [facility]   Monica [name] is a 77 y.o. female with a history of bronchiectasis, RA, and atrial fibrillation who presents for follow up in the [facility]. Patient is on Eliquis for a CHADS2VASC score of 3. She has been maintained on PRN BB but was seen at the ED 05/24/21 for a prolonged episode of heart racing. The episodes started around 2 AM and she took her PRN BB without relief. She went the ED and ECG showed afib with RVR. She spontaneously converted and was started on scheduled metoprolol. She was seen by Dr Lalla Brothers and underwent afib ablation 10/10/22.  On follow up today, patient reports that she has done well since her ablation. She has not had any further afib. She denies ongoing chest pain, swallowing pain, or groin issues.   Today, she denies symptoms of palpitations, chest pain, shortness of breath, orthopnea, PND, lower extremity edema, dizziness, presyncope, syncope, snoring, daytime somnolence, bleeding, or neurologic sequela. The patient is tolerating medications without difficulties and is otherwise without complaint today.    Atrial Fibrillation Risk Factors:  she does not have symptoms or diagnosis of sleep apnea. she does not have a history of rheumatic fever.   Atrial Fibrillation Management history:  Previous antiarrhythmic drugs: none Previous cardioversions: none Previous ablations: 10/10/22 Anticoagulation history: Eliquis   Past Medical History:  Diagnosis Date   Adenomatous colon polyp 10/2003   Allergic rhinitis    Allergy    SEASONAL   Bronchiectasis    Chronic sinusitis    Diverticulosis    GERD (gastroesophageal reflux disease)    Hearing loss    Hemorrhoids    Insomnia    Menopausal disorder    Osteoporosis    Palpitations    Paroxysmal A-fib (HCC)    Rheumatoid  arthritis (HCC)    TMJ syndrome    Urticaria     ROS- All systems are reviewed and negative except as per the HPI above.  Physical Exam: Vitals:   11/08/22 1054  BP: 102/60  Pulse: 93  Weight: 46.1 kg  Height: 5' 1.5" (1.562 m)    GEN: Well nourished, well developed in no acute distress NECK: No JVD; No carotid bruits CARDIAC: Regular rate and rhythm, no murmurs, rubs, gallops RESPIRATORY:  Clear to auscultation without rales, wheezing or rhonchi  ABDOMEN: Soft, non-tender, non-distended EXTREMITIES:  No edema; No deformity    Wt Readings from Last 3 Encounters:  11/08/22 46.1 kg  10/22/22 45.8 kg  10/10/22 45.4 kg    EKG today demonstrates  SR Vent. rate 93 BPM PR interval 152 ms QRS duration 74 ms QT/QTcB 356/442 ms  Echo 01/29/22 demonstrated  1. Left ventricular ejection fraction, by estimation, is 60 to 65%. The  left ventricle has normal function. The left ventricle has no regional  wall motion abnormalities. Left ventricular diastolic parameters are  consistent with Grade I diastolic dysfunction (impaired relaxation).   2. Right ventricular systolic function is normal. The right ventricular  size is normal.   3. The mitral valve is normal in structure. No evidence of mitral valve  regurgitation. No evidence of mitral stenosis.   4. The aortic valve is normal in structure. Aortic valve regurgitation is  trivial. No aortic stenosis is present.   5. The inferior vena cava is normal in size with greater than 50%  respiratory variability,  suggesting right atrial pressure of 3 mmHg.   Epic records are reviewed at length today  CHA2DS2-VASc Score = 3  The patient's score is based upon: CHF History: 0 HTN History: 0 Diabetes History: 0 Stroke History: 0 Vascular Disease History: 0 Age Score: 2 Gender Score: 1        ASSESSMENT AND PLAN: Paroxysmal Atrial Fibrillation (ICD10:  I48.0) The patient's CHA2DS2-VASc score is 3, indicating a 3.2% annual risk  of stroke.   S/p afib ablation 10/10/22 Patient appears to be maintaining SR. Continue Toprol 37.5 mg BID Continue Eliquis 5 mg BID with no missed doses for 3 months post ablation.  Secondary Hypercoagulable State (ICD10:  D68.69) The patient is at significant risk for stroke/thromboembolism based upon her CHA2DS2-VASc Score of 3.  Continue Apixaban (Eliquis).     Follow up with Dr Royann Shivers and Dr Lalla Brothers as scheduled.    Jorja Loa PA-C [facility] [address] [place], [place] [address] [phone] 11/08/2022 11:49 AM

## 2022-11-13 DIAGNOSIS — M1991 Primary osteoarthritis, unspecified site: Secondary | ICD-10-CM | POA: Diagnosis not present

## 2022-11-13 DIAGNOSIS — Z79899 Other long term (current) drug therapy: Secondary | ICD-10-CM | POA: Diagnosis not present

## 2022-11-13 DIAGNOSIS — Z681 Body mass index (BMI) 19 or less, adult: Secondary | ICD-10-CM | POA: Diagnosis not present

## 2022-11-13 DIAGNOSIS — M0579 Rheumatoid arthritis with rheumatoid factor of multiple sites without organ or systems involvement: Secondary | ICD-10-CM | POA: Diagnosis not present

## 2022-11-18 DIAGNOSIS — M0579 Rheumatoid arthritis with rheumatoid factor of multiple sites without organ or systems involvement: Secondary | ICD-10-CM | POA: Diagnosis not present

## 2022-12-04 ENCOUNTER — Encounter (INDEPENDENT_AMBULATORY_CARE_PROVIDER_SITE_OTHER): Payer: Self-pay

## 2022-12-16 DIAGNOSIS — Z79899 Other long term (current) drug therapy: Secondary | ICD-10-CM | POA: Diagnosis not present

## 2022-12-16 DIAGNOSIS — M0579 Rheumatoid arthritis with rheumatoid factor of multiple sites without organ or systems involvement: Secondary | ICD-10-CM | POA: Diagnosis not present

## 2022-12-19 ENCOUNTER — Encounter (INDEPENDENT_AMBULATORY_CARE_PROVIDER_SITE_OTHER): Payer: Self-pay

## 2022-12-24 ENCOUNTER — Other Ambulatory Visit: Payer: Self-pay | Admitting: Obstetrics & Gynecology

## 2022-12-24 DIAGNOSIS — Z681 Body mass index (BMI) 19 or less, adult: Secondary | ICD-10-CM | POA: Diagnosis not present

## 2022-12-24 DIAGNOSIS — Z01411 Encounter for gynecological examination (general) (routine) with abnormal findings: Secondary | ICD-10-CM | POA: Diagnosis not present

## 2022-12-24 DIAGNOSIS — N952 Postmenopausal atrophic vaginitis: Secondary | ICD-10-CM | POA: Diagnosis not present

## 2022-12-24 DIAGNOSIS — M810 Age-related osteoporosis without current pathological fracture: Secondary | ICD-10-CM | POA: Diagnosis not present

## 2022-12-24 DIAGNOSIS — Z124 Encounter for screening for malignant neoplasm of cervix: Secondary | ICD-10-CM | POA: Diagnosis not present

## 2022-12-24 DIAGNOSIS — Z779 Other contact with and (suspected) exposures hazardous to health: Secondary | ICD-10-CM | POA: Diagnosis not present

## 2022-12-24 DIAGNOSIS — Z01419 Encounter for gynecological examination (general) (routine) without abnormal findings: Secondary | ICD-10-CM | POA: Diagnosis not present

## 2023-01-01 ENCOUNTER — Ambulatory Visit (HOSPITAL_COMMUNITY)
Admission: RE | Admit: 2023-01-01 | Discharge: 2023-01-01 | Disposition: A | Discharge status: Home / Self Care | Payer: Medicare Other | Source: Ambulatory Visit | Attending: Obstetrics & Gynecology | Admitting: Obstetrics & Gynecology

## 2023-01-01 DIAGNOSIS — M810 Age-related osteoporosis without current pathological fracture: Secondary | ICD-10-CM | POA: Insufficient documentation

## 2023-01-01 MED ORDER — ZOLEDRONIC ACID 5 MG/100ML IV SOLN: 5.0000 mg | Freq: Once | INTRAVENOUS | Status: AC

## 2023-01-01 MED ORDER — ZOLEDRONIC ACID 5 MG/100ML IV SOLN
INTRAVENOUS | Status: AC
  Administered 2023-01-01: 5 mg via INTRAVENOUS
  Filled 2023-01-01: qty 100

## 2023-01-02 ENCOUNTER — Ambulatory Visit: Payer: Medicare Other | Admitting: Internal Medicine

## 2023-01-05 ENCOUNTER — Ambulatory Visit
Admission: EM | Admit: 2023-01-05 | Discharge: 2023-01-05 | Disposition: A | Discharge status: Home / Self Care | Payer: Medicare Other | Attending: Internal Medicine | Admitting: Internal Medicine

## 2023-01-05 DIAGNOSIS — R21 Rash and other nonspecific skin eruption: Secondary | ICD-10-CM

## 2023-01-05 MED ORDER — PREDNISONE 20 MG PO TABS: 20.0000 mg | ORAL_TABLET | Freq: Every day | ORAL | 0 refills | Status: DC

## 2023-01-05 NOTE — ED Triage Notes (Signed)
Pt reports she has a itching ash in the leg and abdomen x 1 1/2 week. Pt thought it was an insect bite. Hydrocortisone cream gives no relief; witch hazel gives some relief.

## 2023-01-05 NOTE — ED Provider Notes (Signed)
[facility]  Note:  This document was prepared using Conservation officer, historic buildings and may include unintentional dictation errors.  MRN: [id number] DOB: May 08, 1945  Subjective:   Monica [name] is a 77 y.o. 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 No fever, chest pain, shob, wheezing, n/v, abdominal pain, drainage of pus, bleeding, tenderness. Has used hydrocortisone cream without relief. Denies eating any new foods, starting new medications, exposure to poisonous plants, new hygiene products, new cleaning products or detergents. However, the methotrexate that she just started taking had to come from a different pharmacy and definitely has a different appearance, is yellow as opposed to white.  No current facility-administered medications for this encounter.  Current Outpatient Medications:    acetaminophen (TYLENOL) 500 MG tablet, Take 500 mg by mouth every 6 (six) hours as needed for moderate pain or headache., Disp: , Rfl:    calcium carbonate (TUMS EX) 750 MG chewable tablet, Chew 1 tablet by mouth at bedtime as needed for heartburn., Disp: , Rfl:    Calcium-Magnesium-Zinc (CAL-MAG-ZINC PO), Take 1 tablet by mouth daily., Disp: , Rfl:    certolizumab pegol (CIMZIA) 2 X 200 MG KIT, Per Rheumatology (Patient taking differently: Inject 400 mg into the skin every 30 (thirty) days. Per Rheumatology), Disp: 1 kit, Rfl: 1   Cholecalciferol (VITAMIN D) 1000 UNITS capsule, Take 1,000 Units by mouth daily with lunch., Disp: , Rfl:    dextromethorphan (DELSYM) 30 MG/5ML liquid, Take 30 mg by mouth at bedtime as needed for cough. , Disp: , Rfl:    ELIQUIS 5 MG TABS tablet, TAKE 1 TABLET TWICE A DAY, Disp: 180 tablet, Rfl: 3   Estradiol 10 MCG TABS, Place 10 mcg vaginally 3 (three) times a week., Disp: , Rfl:    fluticasone (FLONASE ALLERGY RELIEF) 50  MCG/ACT nasal spray, Place 1 spray into both nostrils as needed., Disp: , Rfl:    folic acid (FOLVITE) 1 MG tablet, Take 2 mg by mouth daily with lunch., Disp: , Rfl:    guaiFENesin (MUCINEX) 600 MG 12 hr tablet, Take 600 mg by mouth 2 (two) times daily as needed (congestion/cough)., Disp: , Rfl:    Hydrocortisone (CORTIZONE-10 EX), Apply 1 Application topically 3 (three) times daily as needed (itching/bug bites)., Disp: , Rfl:    Lactobacillus-Inulin (CULTURELLE DIGESTIVE HEALTH PO), Take 1 capsule by mouth in the morning., Disp: , Rfl:    loratadine (CLARITIN) 10 MG tablet, Take 10 mg by mouth in the morning., Disp: , Rfl:    methotrexate (RHEUMATREX) 2.5 MG tablet, Take 15 mg by mouth every Sunday. 6 tablets, Disp: , Rfl:    metoprolol succinate (TOPROL XL) 25 MG 24 hr tablet, Take 1.5 tablets (37.5 mg total) by mouth in the morning and at bedtime., Disp: 270 tablet, Rfl: 3   metoprolol tartrate (LOPRESSOR) 25 MG tablet, Take 0.5 tablets (12.5 mg total) by mouth daily as needed. (Patient not taking: Reported on 11/08/2022), Disp: 90 tablet, Rfl: 3   Multiple Vitamin (MULTIVITAMIN WITH MINERALS) TABS tablet, Take 1 tablet by mouth daily with supper., Disp: , Rfl:    pantoprazole (PROTONIX) 40 MG tablet, Take 1 tablet (40 mg total) by mouth daily., Disp: 45 tablet, Rfl: 0   Polyethyl Glycol-Propyl Glycol (SYSTANE ULTRA) 0.4-0.3 % SOLN, Place 1 drop into both eyes 3 (three) times daily as needed., Disp: , Rfl:  Respiratory Therapy Supplies (FLUTTER) DEVI, Twice a day and prn as needed, may increase if feeling worse, Disp: 1 each, Rfl: 0   Zoledronic Acid (RECLAST IV), Inject into the vein. IV yearly, Disp: , Rfl:    Allergies  Allergen Reactions   Amoxicillin Diarrhea   Colchicine Diarrhea   Moxifloxacin Nausea Only   Nitrofurantoin Diarrhea and Nausea Only   Albuterol Other (See Comments)    Patient states put her into atrial fib last time she took this medication   Amoxicillin-Pot  Clavulanate Diarrhea    Has patient had a PCN reaction causing immediate rash, facial/tongue/throat swelling, SOB or lightheadedness with hypotension:No Has patient had a PCN reaction causing severe rash involving mucus membranes or skin necrosis:No Has patient had a PCN reaction that required hospitalization:No Has patient had a PCN reaction occurring within the last 10 years:Yes If all of the above answers are "NO", then may proceed with Cephalosporin use   Avelox [Moxifloxacin Hcl In Nacl] Nausea Only   Benzonatate Other (See Comments)    felt bad, out of sorts, disoriented.   Ciprofloxacin Rash and Other (See Comments)    Rash across abdomen   Macrobid [Nitrofurantoin Macrocrystal] Diarrhea    Diarrhea, nausea   Septra [Sulfamethoxazole-Trimethoprim] Other (See Comments)    flushing    Past Medical History:  Diagnosis Date   Adenomatous colon polyp 10/2003   Allergic rhinitis    Allergy    SEASONAL   Bronchiectasis    Chronic sinusitis    Diverticulosis    GERD (gastroesophageal reflux disease)    Hearing loss    Hemorrhoids    Insomnia    Menopausal disorder    Osteoporosis    Palpitations    Paroxysmal A-fib (HCC)    Rheumatoid arthritis (HCC)    TMJ syndrome    Urticaria      Past Surgical History:  Procedure Laterality Date   ATRIAL FIBRILLATION ABLATION N/A 10/10/2022   Procedure: ATRIAL FIBRILLATION ABLATION;  Surgeon: Lanier Prude, MD;  Location: [facility];  Service: Cardiovascular;  Laterality: N/A;   COLONOSCOPY     NASAL SINUS SURGERY     TYMPANOSTOMY     VIDEO BRONCHOSCOPY Bilateral 07/16/2016   Procedure: VIDEO BRONCHOSCOPY WITH FLUORO;  Surgeon: Leslye Peer, MD;  Location: [facility];  Service: Cardiopulmonary;  Laterality: Bilateral;    Family History  Problem Relation Age of Onset   Rectal cancer Mother        died age 60   Seizures Mother    Colon cancer Mother 81   Cancer Mother    Heart disease Father        dies age 31    Hypertension Father    Heart disease Maternal Grandfather    AVM Brother    Cancer Maternal Grandmother    Cancer Paternal Uncle        multiple uncles with cancer   Esophageal cancer Neg Hx     Social History   Tobacco Use   Smoking status: Never   Smokeless tobacco: Never  Vaping Use   Vaping status: Never Used  Substance Use Topics   Alcohol use: Yes    Alcohol/week: 1.0 - 2.0 standard drink of alcohol    Types: 1 - 2 Glasses of wine per week    Comment: 1-2 glasses of wine a month 05/29/21   Drug use: No    ROS   Objective:   Vitals: BP (!) 143/69 (BP Location: Right Arm)  Resp 16   SpO2 96%   Physical Exam Constitutional:      General: She is not in acute distress.    Appearance: Normal appearance. She is well-developed. She is not ill-appearing, toxic-appearing or diaphoretic.  HENT:     Head: Normocephalic and atraumatic.     Nose: Nose normal.     Mouth/Throat:     Mouth: Mucous membranes are moist.  Eyes:     General: No scleral icterus.       Right eye: No discharge.        Left eye: No discharge.     Extraocular Movements: Extraocular movements intact.  Cardiovascular:     Rate and Rhythm: Normal rate and regular rhythm.     Heart sounds: Normal heart sounds. No murmur heard.    No friction rub. No gallop.  Pulmonary:     Effort: Pulmonary effort is normal. No respiratory distress.     Breath sounds: No stridor. No wheezing, rhonchi or rales.  Chest:     Chest wall: No tenderness.  Skin:    General: Skin is warm and dry.     Findings: Rash (scattered macular lesions all less than 1/2cm over her lower extremities mostly but also to a lesser degree over the lower abdomen, flank sides, forearms) present.  Neurological:     General: No focal deficit present.     Mental Status: She is alert and oriented to person, place, and time.  Psychiatric:        Mood and Affect: Mood normal.        Behavior: Behavior normal.     Assessment and Plan  :   PDMP not reviewed this encounter.  1. Rash and nonspecific skin eruption    Suspect a systemic reaction, recommended an oral prednisone course.  Patient reports that she has typically had a difficult time with steroids but is agreeable to taking the because of her concern for systemic reaction as well.  Recommended 20 mg of oral prednisone for 5 days.  Monitor for new exposures and avoid them as much as possible.  Discussed the possibility of obtaining a different supply of methotrexate that she has not previously reacted to.  She will contact the pharmacy for this.  Counseled patient on potential for adverse effects with medications prescribed/recommended today, ER and return-to-clinic precautions discussed, patient verbalized understanding.    Wallis Bamberg, PA-C 01/05/23 1402

## 2023-01-05 NOTE — Discharge Instructions (Addendum)
Make sure you avoid new exposures. Start the oral prednisone 20mg  daily for 5 days to help with the suspected reaction to an unknown allergen.  Please avoid anything new and follow-up as soon as possible with your pharmacist to see if you could get the previous methotrexate you are using.  If your symptoms worsen, please go to the hospital/emergency room.

## 2023-01-10 ENCOUNTER — Ambulatory Visit: Payer: Medicare Other | Attending: Cardiovascular Disease | Admitting: Cardiovascular Disease

## 2023-01-10 ENCOUNTER — Encounter: Payer: Self-pay | Admitting: Cardiovascular Disease

## 2023-01-10 VITALS — BP 109/64 | HR 92 | Ht 61.5 in | Wt 102.6 lb

## 2023-01-10 DIAGNOSIS — I493 Ventricular premature depolarization: Secondary | ICD-10-CM | POA: Diagnosis not present

## 2023-01-10 DIAGNOSIS — D6869 Other thrombophilia: Secondary | ICD-10-CM | POA: Diagnosis not present

## 2023-01-10 DIAGNOSIS — M059 Rheumatoid arthritis with rheumatoid factor, unspecified: Secondary | ICD-10-CM | POA: Insufficient documentation

## 2023-01-10 DIAGNOSIS — I480 Paroxysmal atrial fibrillation: Secondary | ICD-10-CM | POA: Diagnosis not present

## 2023-01-10 NOTE — Progress Notes (Signed)
Cardiology office note   Date:  01/10/2023   ID:  Monica [name], DOB 04-27-1946, MRN [id number]   PCP:  Tresa Garter, MD  Cardiologist:  Thurmon Fair, MD  Electrophysiologist:  Lanier Prude, MD   Evaluation Performed:  Follow-Up Visit  Chief Complaint: Atrial fibrillation  History of Present Illness:    Monica [name] is a 77 y.o. female with palpitations related to episodes of paroxysmal atrial fibrillation as well as PACs and PVCs, without significant underlying structural heart disease.  Increasingly frequent episodes of symptomatic paroxysmal atrial fibrillation over the last 12 months she underwent atrial fibrillation ablation by Dr. Lalla Brothers on 10/10/2022.  She has done quite well since then.  She is only had 2 brief episodes of rapid palpitations, each lasting less than 10 minutes, occurring about 6 weeks and 12 weeks after the ablation respectively.  She continues to take metoprolol succinate 37.5 mg daily and has a prescription for "as needed" metoprolol tartrate if necessary.  Of note she had symptomatic palpitations from PVCs even before developing atrial fibrillation.  Over the last few months the frequency of her palpitations have increased drastically, although the episodes remain relatively brief.  They usually resolve in less than an hour.  He feels that taking an additional low-dose of metoprolol to tartrate does help.  When we try to escalate the maintenance dose of metoprolol succinate she had profound fatigue that was intolerable.  She feels a little unwell during the atrial fibrillation and is worried about the rapid ventricular rates, but she has not required emergency room visits or hospitalization.  She is scheduled for A-fib ablation with Dr. Lalla Brothers in early June.  The echocardiogram in September 2023 showed normal left ventricular systolic function and normal size left atrium, no significant valve problems.  She had a calcium score of 0 on the  preablation CT in May 2024.  Rheumatoid arthritis symptoms are reasonably well-controlled on methotrexate and Cimzia (rheumatologist is Dr. Lennette Bihari at the [facility] office).  Other comorbid conditions include bronchiectasis, gastroesophageal reflux disease, irritable bowel syndrome, osteoporosis, chronic sinusitis and mild anxiety disorder.    Past Medical History:  Diagnosis Date   Adenomatous colon polyp 10/2003   Allergic rhinitis    Allergy    SEASONAL   Bronchiectasis    Chronic sinusitis    Diverticulosis    GERD (gastroesophageal reflux disease)    Hearing loss    Hemorrhoids    Insomnia    Menopausal disorder    Osteoporosis    Palpitations    Paroxysmal A-fib (HCC)    Rheumatoid arthritis (HCC)    TMJ syndrome    Urticaria    Past Surgical History:  Procedure Laterality Date   ATRIAL FIBRILLATION ABLATION N/A 10/10/2022   Procedure: ATRIAL FIBRILLATION ABLATION;  Surgeon: Lanier Prude, MD;  Location: [facility];  Service: Cardiovascular;  Laterality: N/A;   COLONOSCOPY     NASAL SINUS SURGERY     TYMPANOSTOMY     VIDEO BRONCHOSCOPY Bilateral 07/16/2016   Procedure: VIDEO BRONCHOSCOPY WITH FLUORO;  Surgeon: Leslye Peer, MD;  Location: [facility];  Service: Cardiopulmonary;  Laterality: Bilateral;     Current Meds  Medication Sig   acetaminophen (TYLENOL) 500 MG tablet Take 500 mg by mouth every 6 (six) hours as needed for moderate pain or headache.   Calcium-Magnesium-Zinc (CAL-MAG-ZINC PO) Take 1 tablet by mouth daily.   certolizumab pegol (CIMZIA) 2 X 200 MG KIT Per Rheumatology (  Patient taking differently: Inject 400 mg into the skin every 30 (thirty) days. Per Rheumatology)   Cholecalciferol (VITAMIN D) 1000 UNITS capsule Take 1,000 Units by mouth daily with lunch.   dextromethorphan (DELSYM) 30 MG/5ML liquid Take 30 mg by mouth at bedtime as needed for cough.    ELIQUIS 5 MG TABS tablet TAKE 1 TABLET TWICE A DAY   Estradiol 10 MCG TABS  Place 10 mcg vaginally 3 (three) times a week.   fluticasone (FLONASE ALLERGY RELIEF) 50 MCG/ACT nasal spray Place 1 spray into both nostrils as needed.   folic acid (FOLVITE) 1 MG tablet Take 2 mg by mouth daily with lunch.   Lactobacillus-Inulin (CULTURELLE DIGESTIVE HEALTH PO) Take 1 capsule by mouth in the morning.   loratadine (CLARITIN) 10 MG tablet Take 10 mg by mouth in the morning.   methotrexate (RHEUMATREX) 2.5 MG tablet Take 15 mg by mouth every Sunday. 6 tablets   metoprolol succinate (TOPROL XL) 25 MG 24 hr tablet Take 1.5 tablets (37.5 mg total) by mouth in the morning and at bedtime.   metoprolol tartrate (LOPRESSOR) 25 MG tablet Take 0.5 tablets (12.5 mg total) by mouth daily as needed.   Multiple Vitamins-Minerals (ONE A DAY WOMEN 50 PLUS) CHEW Chew 1 tablet by mouth daily.   Respiratory Therapy Supplies (FLUTTER) DEVI Twice a day and prn as needed, may increase if feeling worse   Zoledronic Acid (RECLAST IV) Inject into the vein. IV yearly     Allergies:   Amoxicillin, Colchicine, Moxifloxacin, Nitrofurantoin, Albuterol, Amoxicillin-pot clavulanate, Avelox [moxifloxacin hcl in nacl], Benzonatate, Ciprofloxacin, Macrobid [nitrofurantoin macrocrystal], and Septra [sulfamethoxazole-trimethoprim]   Social History   Tobacco Use   Smoking status: Never   Smokeless tobacco: Never  Vaping Use   Vaping status: Never Used  Substance Use Topics   Alcohol use: Yes    Alcohol/week: 1.0 - 2.0 standard drink of alcohol    Types: 1 - 2 Glasses of wine per week    Comment: 1-2 glasses of wine a month 05/29/21   Drug use: No     Family Hx: The patient's family history includes AVM in her brother; Cancer in her maternal grandmother, mother, and paternal uncle; Colon cancer (age of onset: 64) in her mother; Heart disease in her father and maternal grandfather; Hypertension in her father; Rectal cancer in her mother; Seizures in her mother. There is no history of Esophageal  cancer.  ROS:   Please see the history of present illness.     All other systems reviewed and are negative.   Prior CV studies:   The following studies were reviewed today: Event monitor January 2020 showing very brief atrial fibrillation and frequent PACs Echo 2017 shows normal left ventricular systolic function, no valvular abnormalities, normal left atrial size  Echocardiogram 01/29/2022     1. Left ventricular ejection fraction, by estimation, is 60 to 65%. The  left ventricle has normal function. The left ventricle has no regional  wall motion abnormalities. Left ventricular diastolic parameters are  consistent with Grade I diastolic  dysfunction (impaired relaxation).   2. Right ventricular systolic function is normal. The right ventricular  size is normal.   3. The mitral valve is normal in structure. No evidence of mitral valve  regurgitation. No evidence of mitral stenosis.   4. The aortic valve is normal in structure. Aortic valve regurgitation is  trivial. No aortic stenosis is present.   5. The inferior vena cava is normal in size with greater than  50%  respiratory variability, suggesting right atrial pressure of 3 mmHg.   Labs/Other Tests and Data Reviewed:    EKG: Personally reviewed the tracing from 11/08/2022 shows normal sinus rhythm, normal tracing  Recent Labs: 09/04/2022: ALT 11 09/20/2022: BUN 19; Creatinine, Ser 0.78; Hemoglobin 13.2; Platelets 388; Potassium 4.5; Sodium 136   Recent Lipid Panel Lab Results  Component Value Date/Time   CHOL 140 07/06/2018 07:55 AM   TRIG 41.0 07/06/2018 07:55 AM   HDL 62.80 07/06/2018 07:55 AM   CHOLHDL 2 07/06/2018 07:55 AM   LDLCALC 69 07/06/2018 07:55 AM    Wt Readings from Last 3 Encounters:  01/10/23 102 lb 9.6 oz (46.5 kg)  11/08/22 101 lb 9.6 oz (46.1 kg)  10/22/22 101 lb (45.8 kg)     Objective:    Vital Signs:  BP 109/64 (BP Location: Left Arm, Patient Position: Sitting, Cuff Size: Normal)   Pulse  92   Ht 5' 1.5" (1.562 m)   Wt 102 lb 9.6 oz (46.5 kg)   SpO2 97%   BMI 19.07 kg/m     General: Alert, oriented x3, no distress, very lean, petite body habitus Head: no evidence of trauma, PERRL, EOMI, no exophtalmos or lid lag, no myxedema, no xanthelasma; normal ears, nose and oropharynx Neck: normal jugular venous pulsations and no hepatojugular reflux; brisk carotid pulses without delay and no carotid bruits Chest: clear to auscultation, no signs of consolidation by percussion or palpation, normal fremitus, symmetrical and full respiratory excursions Cardiovascular: normal position and quality of the apical impulse, regular rhythm, normal first and second heart sounds, no murmurs, rubs or gallops Abdomen: no tenderness or distention, no masses by palpation, no abnormal pulsatility or arterial bruits, normal bowel sounds, no hepatosplenomegaly Extremities: no clubbing, cyanosis or edema; 2+ radial, ulnar and brachial pulses bilaterally; 2+ right femoral, posterior tibial and dorsalis pedis pulses; 2+ left femoral, posterior tibial and dorsalis pedis pulses; no subclavian or femoral bruits Neurological: grossly nonfocal Psych: Normal mood and affect     ASSESSMENT & PLAN:    1. Paroxysmal atrial fibrillation (HCC)   2. PVCs (premature ventricular contractions)   3. Acquired thrombophilia (HCC)   4. Rheumatoid arthritis with positive rheumatoid factor, involving unspecified site [facility])         AFib: Symptoms have diminished in frequency and duration and she only had 2 brief episodes of rapid palpitations in the 3 months following her ablation.  She has an appointment with Dr. Lalla Brothers in about a month's time.  She continues on anticoagulation. PVCs: Isolated PVCs preceded her diagnosis of atrial fibrillation and she will continue beta-blockers as needed. Anticoagulation: Well-tolerated, no bleeding complications. RA: Currently on methotrexate and Cimzia.  Patient Instructions   Medication Instructions:  No changes *If you need a refill on your cardiac medications before your next appointment, please call your pharmacy*  Follow-Up: At [facility], you and your health needs are our priority.  As part of our continuing mission to provide you with exceptional heart care, we have created designated Provider Care Teams.  These Care Teams include your primary Cardiologist (physician) and Advanced Practice Providers (APPs -  Physician Assistants and Nurse Practitioners) who all work together to provide you with the care you need, when you need it.  We recommend signing up for the patient portal called "MyChart".  Sign up information is provided on this After Visit Summary.  MyChart is used to connect with patients for Virtual Visits (Telemedicine).  Patients are able to  view lab/test results, encounter notes, upcoming appointments, etc.  Non-urgent messages can be sent to your provider as well.   To learn more about what you can do with MyChart, go to [email or web address].    Your next appointment:   1 year(s)  Provider:   Thurmon Fair, MD       Signed, Thurmon Fair, MD  01/10/2023 12:40 PM    [facility]

## 2023-01-10 NOTE — Patient Instructions (Signed)

## 2023-01-11 ENCOUNTER — Ambulatory Visit
Admission: RE | Admit: 2023-01-11 | Discharge: 2023-01-11 | Disposition: A | Discharge status: Home / Self Care | Payer: Medicare Other | Source: Ambulatory Visit | Attending: Internal Medicine

## 2023-01-11 ENCOUNTER — Telehealth: Payer: Self-pay | Admitting: Nurse Practitioner

## 2023-01-11 ENCOUNTER — Ambulatory Visit (INDEPENDENT_AMBULATORY_CARE_PROVIDER_SITE_OTHER): Payer: Medicare Other

## 2023-01-11 VITALS — BP 136/82 | HR 110 | Temp 98.3°F | Resp 18

## 2023-01-11 DIAGNOSIS — R918 Other nonspecific abnormal finding of lung field: Secondary | ICD-10-CM | POA: Diagnosis not present

## 2023-01-11 DIAGNOSIS — B349 Viral infection, unspecified: Secondary | ICD-10-CM | POA: Insufficient documentation

## 2023-01-11 DIAGNOSIS — R051 Acute cough: Secondary | ICD-10-CM | POA: Diagnosis not present

## 2023-01-11 DIAGNOSIS — R509 Fever, unspecified: Secondary | ICD-10-CM | POA: Diagnosis not present

## 2023-01-11 DIAGNOSIS — Z1152 Encounter for screening for COVID-19: Secondary | ICD-10-CM | POA: Diagnosis not present

## 2023-01-11 DIAGNOSIS — R059 Cough, unspecified: Secondary | ICD-10-CM | POA: Diagnosis not present

## 2023-01-11 DIAGNOSIS — J189 Pneumonia, unspecified organism: Secondary | ICD-10-CM

## 2023-01-11 LAB — POCT INFLUENZA A/B
Influenza A, POC: NEGATIVE
Influenza B, POC: NEGATIVE

## 2023-01-11 MED ORDER — DOXYCYCLINE HYCLATE 100 MG PO CAPS
100.0000 mg | ORAL_CAPSULE | Freq: Two times a day (BID) | ORAL | 0 refills | Status: DC
Start: 2023-01-11 — End: 2023-01-28

## 2023-01-11 NOTE — ED Provider Notes (Signed)
[facility]    CSN: [id number] Arrival date & time: 01/11/23  1057      History   Chief Complaint Chief Complaint  Patient presents with   Cough   Fever    HPI Monica [name] is a 77 y.o. female  presents for evaluation of URI symptoms for 2 days.  Patient has a medical history including bronchiectasis, rheumatoid arthritis and paroxysmal A-fib.  Patient reports associated symptoms of cough, congestion, fatigue, headache, nausea, low-grade fever. Denies vomiting, diarrhea, sore throat, body aches, shortness of breath. Patient does not have a hx of asthma or smoking. No known sick contacts.  Pt has taken Tylenol OTC for symptoms.  Reports a negative home COVID test.  Pt has no other concerns at this time.    Cough Associated symptoms: fever and headaches   Fever Associated symptoms: cough, headaches and nausea     Past Medical History:  Diagnosis Date   Adenomatous colon polyp 10/2003   Allergic rhinitis    Allergy    SEASONAL   Bronchiectasis    Chronic sinusitis    Diverticulosis    GERD (gastroesophageal reflux disease)    Hearing loss    Hemorrhoids    Insomnia    Menopausal disorder    Osteoporosis    Palpitations    Paroxysmal A-fib (HCC)    Rheumatoid arthritis (HCC)    TMJ syndrome    Urticaria     Patient Active Problem List   Diagnosis Date Noted   Diarrhea 10/22/2022   Sensorineural hearing loss (SNHL) of both ears 08/12/2022   Myringotomy tube status 05/30/2022   Atrophy of vagina 09/26/2021   Cyst of ovary 09/26/2021   Fatigue 09/26/2021   Folliculitis 09/26/2021   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) 09/26/2021   Rheumatoid arthritis (HCC) 05/30/2021   Hypercoagulable state due to paroxysmal atrial fibrillation (HCC) 05/29/2021   Eustachian tube dysfunction, right 11/20/2018   Cough 05/27/2016   Paroxysmal atrial fibrillation (HCC) 09/19/2015   Healthcare maintenance  02/16/2013   Seasonal allergic rhinitis 04/23/2007   GERD 03/10/2007   Insomnia disorder 01/26/2007   HEARING LOSS, LEFT EAR 01/26/2007   Sinusitis, chronic 01/26/2007   Bronchiectasis without acute exacerbation (HCC) 01/26/2007   TMJ SYNDROME 01/26/2007   OSTEOPOROSIS 01/26/2007    Past Surgical History:  Procedure Laterality Date   ATRIAL FIBRILLATION ABLATION N/A 10/10/2022   Procedure: ATRIAL FIBRILLATION ABLATION;  Surgeon: Lanier Prude, MD;  Location: [facility];  Service: Cardiovascular;  Laterality: N/A;   COLONOSCOPY     NASAL SINUS SURGERY     TYMPANOSTOMY     VIDEO BRONCHOSCOPY Bilateral 07/16/2016   Procedure: VIDEO BRONCHOSCOPY WITH FLUORO;  Surgeon: Leslye Peer, MD;  Location: [facility];  Service: Cardiopulmonary;  Laterality: Bilateral;    OB History   No obstetric history on file.      Home Medications    Prior to Admission medications   Medication Sig Start Date End Date Taking? Authorizing Provider  acetaminophen (TYLENOL) 500 MG tablet Take 500 mg by mouth every 6 (six) hours as needed for moderate pain or headache.    [provider]  calcium carbonate (TUMS EX) 750 MG chewable tablet Chew 1 tablet by mouth at bedtime as needed for heartburn. Patient not taking: Reported on 01/10/2023    [provider]  Calcium-Magnesium-Zinc (CAL-MAG-ZINC PO) Take 1 tablet by mouth daily.    [provider]  certolizumab  pegol (CIMZIA) 2 X 200 MG KIT Per Rheumatology Patient taking differently: Inject 400 mg into the skin every 30 (thirty) days. Per Rheumatology 02/28/22   Plotnikov, Georgina Quint, MD  Cholecalciferol (VITAMIN D) 1000 UNITS capsule Take 1,000 Units by mouth daily with lunch.    [provider]  dextromethorphan (DELSYM) 30 MG/5ML liquid Take 30 mg by mouth at bedtime as needed for cough.     [provider]  ELIQUIS 5 MG TABS tablet TAKE 1 TABLET TWICE A DAY 11/01/22   Croitoru, Mihai, MD  Estradiol  10 MCG TABS Place 10 mcg vaginally 3 (three) times a week.    [provider]  fluticasone (FLONASE ALLERGY RELIEF) 50 MCG/ACT nasal spray Place 1 spray into both nostrils as needed.    [provider]  folic acid (FOLVITE) 1 MG tablet Take 2 mg by mouth daily with lunch.    [provider]  guaiFENesin (MUCINEX) 600 MG 12 hr tablet Take 600 mg by mouth 2 (two) times daily as needed (congestion/cough). Patient not taking: Reported on 01/10/2023    [provider]  Hydrocortisone (CORTIZONE-10 EX) Apply 1 Application topically 3 (three) times daily as needed (itching/bug bites). Patient not taking: Reported on 01/10/2023    [provider]  Lactobacillus-Inulin (CULTURELLE DIGESTIVE HEALTH PO) Take 1 capsule by mouth in the morning.    [provider]  loratadine (CLARITIN) 10 MG tablet Take 10 mg by mouth in the morning.    [provider]  methotrexate (RHEUMATREX) 2.5 MG tablet Take 15 mg by mouth every Sunday. 6 tablets 08/28/21   [provider]  metoprolol succinate (TOPROL XL) 25 MG 24 hr tablet Take 1.5 tablets (37.5 mg total) by mouth in the morning and at bedtime. 04/24/22   Croitoru, Mihai, MD  metoprolol tartrate (LOPRESSOR) 25 MG tablet Take 0.5 tablets (12.5 mg total) by mouth daily as needed. 11/07/21   Plotnikov, Georgina Quint, MD  Multiple Vitamins-Minerals (ONE A DAY WOMEN 50 PLUS) CHEW Chew 1 tablet by mouth daily.    [provider]  Polyethyl Glycol-Propyl Glycol (SYSTANE ULTRA) 0.4-0.3 % SOLN Place 1 drop into both eyes 3 (three) times daily as needed. Patient not taking: Reported on 01/10/2023    [provider]  Respiratory Therapy Supplies (FLUTTER) DEVI Twice a day and prn as needed, may increase if feeling worse 01/30/18   Coral Ceo, NP  Zoledronic Acid (RECLAST IV) Inject into the vein. IV yearly    [provider]    Family History Family History  Problem Relation Age of Onset    Rectal cancer Mother        died age 79   Seizures Mother    Colon cancer Mother 51   Cancer Mother    Heart disease Father        dies age 92   Hypertension Father    Heart disease Maternal Grandfather    AVM Brother    Cancer Maternal Grandmother    Cancer Paternal Uncle        multiple uncles with cancer   Esophageal cancer Neg Hx     Social History Social History   Tobacco Use   Smoking status: Never   Smokeless tobacco: Never  Vaping Use   Vaping status: Never Used  Substance Use Topics   Alcohol use: Yes    Alcohol/week: 1.0 - 2.0 standard drink of alcohol    Types: 1 - 2 Glasses of wine per week  Comment: 1-2 glasses of wine a month 05/29/21   Drug use: No     Allergies   Amoxicillin, Colchicine, Moxifloxacin, Nitrofurantoin, Albuterol, Amoxicillin-pot clavulanate, Avelox [moxifloxacin hcl in nacl], Benzonatate, Ciprofloxacin, Macrobid [nitrofurantoin macrocrystal], and Septra [sulfamethoxazole-trimethoprim]   Review of Systems Review of Systems  Constitutional:  Positive for fatigue and fever.  Respiratory:  Positive for cough.   Gastrointestinal:  Positive for nausea.  Neurological:  Positive for headaches.     Physical Exam Triage Vital Signs ED Triage Vitals  Encounter Vitals Group     BP 01/11/23 1123 136/82     Systolic BP Percentile --      Diastolic BP Percentile --      Pulse Rate 01/11/23 1123 (!) 110     Resp 01/11/23 1123 18     Temp 01/11/23 1123 98.3 F (36.8 C)     Temp Source 01/11/23 1123 Oral     SpO2 01/11/23 1123 96 %     Weight --      Height --      Head Circumference --      Peak Flow --      Pain Score 01/11/23 1127 2     Pain Loc --      Pain Education --      Exclude from Growth Chart --    No data found.  Updated Vital Signs BP 136/82 (BP Location: Right Arm)   Pulse (!) 110   Temp 98.3 F (36.8 C) (Oral)   Resp 18   SpO2 96%   Visual Acuity Right Eye Distance:   Left Eye Distance:   Bilateral  Distance:    Right Eye Near:   Left Eye Near:    Bilateral Near:     Physical Exam Vitals and nursing note reviewed.  Constitutional:      General: She is not in acute distress.    Appearance: Normal appearance. She is well-developed. She is not ill-appearing.  HENT:     Head: Normocephalic and atraumatic.     Right Ear: Tympanic membrane and ear canal normal.     Left Ear: Tympanic membrane and ear canal normal.     Nose: Congestion present.     Mouth/Throat:     Mouth: Mucous membranes are moist.     Pharynx: Oropharynx is clear. Uvula midline. No oropharyngeal exudate or posterior oropharyngeal erythema.     Tonsils: No tonsillar exudate or tonsillar abscesses.  Eyes:     Conjunctiva/sclera: Conjunctivae normal.     Pupils: Pupils are equal, round, and reactive to light.  Cardiovascular:     Rate and Rhythm: Regular rhythm. Tachycardia present.     Heart sounds: Normal heart sounds.     Comments: Mildly tacky at 110 Pulmonary:     Effort: Pulmonary effort is normal.     Breath sounds: Normal breath sounds.  Musculoskeletal:     Cervical back: Normal range of motion and neck supple.  Lymphadenopathy:     Cervical: No cervical adenopathy.  Skin:    General: Skin is warm and dry.  Neurological:     General: No focal deficit present.     Mental Status: She is alert and oriented to person, place, and time.  Psychiatric:        Mood and Affect: Mood normal.        Behavior: Behavior normal.      UC Treatments / Results  Labs (all labs ordered are listed, but only abnormal results are displayed)  Labs Reviewed  SARS CORONAVIRUS 2 (TAT 6-24 HRS)  POCT INFLUENZA A/B    EKG   Radiology No results found.  Procedures Procedures (including critical care time)  Medications Ordered in UC Medications - No data to display  Initial Impression / Assessment and Plan / UC Course  I have reviewed the triage vital signs and the nursing notes.  Pertinent labs & imaging  results that were available during my care of the patient were reviewed by me and considered in my medical decision making (see chart for details).     Reviewed exam and symptoms with patient.  No red flags.  Negative rapid flu.  COVID PCR and will contact if positive.  Chest x-ray wet read shows no changes from previous x-rays.  Will contact patient if radiology overread shows any positive/new results.  Discussed viral illness and symptomatic treatment.  Patient prefers OTC cough medicine will continue as needed.  PCP follow-up 2 days for recheck.  Strict ER precautions reviewed and patient verbalized understanding. Final Clinical Impressions(s) / UC Diagnoses   Final diagnoses:  Acute cough  Viral illness     Discharge Instructions      The clinic will contact you with results of the COVID test done today if positive.  Lots of rest and fluids.  You may take over-the-counter Tylenol or cough medicine as needed.  Follow-up with your PCP in 2 days for recheck.  Please go to the emergency room for any worsening symptoms.  I hope you feel better soon!     ED Prescriptions   None    PDMP not reviewed this encounter.   Radford Pax, NP 01/11/23 1216

## 2023-01-11 NOTE — Telephone Encounter (Signed)
Contacted patient regarding radiology over read of x-ray.  Patient verified DOB.  Radiology shows worsening atypical infection bilaterally.  Given patient history will start doxycycline twice daily.  This will not interfere with her Eliquis.  She is on methotrexate but states she has been holding it for the past week given her symptoms.  Advised her to call her rheumatologist to confirm if she should restart it or continue to hold.  She again was encouraged to follow-up with her PCP in 2 days for recheck.  ER precautions were reviewed and patient verbalized understanding.

## 2023-01-11 NOTE — ED Triage Notes (Signed)
Pt presents to UC w/ c/o coughing, fever, fatigue, headache, nausea x2 days.  Home covid test negative Tylenol for fever

## 2023-01-11 NOTE — Discharge Instructions (Signed)
The clinic will contact you with results of the COVID test done today if positive.  Lots of rest and fluids.  You may take over-the-counter Tylenol or cough medicine as needed.  Follow-up with your PCP in 2 days for recheck.  Please go to the emergency room for any worsening symptoms.  I hope you feel better soon!

## 2023-01-12 LAB — SARS CORONAVIRUS 2 (TAT 6-24 HRS): SARS Coronavirus 2: NEGATIVE

## 2023-01-13 ENCOUNTER — Other Ambulatory Visit: Payer: Self-pay

## 2023-01-13 ENCOUNTER — Encounter: Payer: Self-pay | Admitting: Cardiovascular Disease

## 2023-01-13 MED ORDER — METOPROLOL SUCCINATE ER 25 MG PO TB24: 37.5000 mg | ORAL_TABLET | Freq: Two times a day (BID) | ORAL | 3 refills | Status: DC

## 2023-01-15 ENCOUNTER — Telehealth: Payer: Self-pay | Admitting: Emergency Medicine

## 2023-01-15 NOTE — Telephone Encounter (Signed)
Patient states having symptoms of cough, headache and nausea. Pharmacy is [facility] [address] Patient phone number is [phone].

## 2023-01-15 NOTE — Telephone Encounter (Signed)
Pt was seen by urgent care on Saturday. She was given abx. She has since scheduled appt with her primary care for tomorrow. Nothing further needed.

## 2023-01-16 ENCOUNTER — Ambulatory Visit (INDEPENDENT_AMBULATORY_CARE_PROVIDER_SITE_OTHER): Payer: Medicare Other | Admitting: Internal Medicine

## 2023-01-16 ENCOUNTER — Encounter: Payer: Self-pay | Admitting: Internal Medicine

## 2023-01-16 VITALS — BP 118/76 | HR 110 | Temp 97.9°F | Ht 61.5 in | Wt 100.6 lb

## 2023-01-16 DIAGNOSIS — J22 Unspecified acute lower respiratory infection: Secondary | ICD-10-CM

## 2023-01-16 DIAGNOSIS — J471 Bronchiectasis with (acute) exacerbation: Secondary | ICD-10-CM

## 2023-01-16 NOTE — Patient Instructions (Addendum)
      Medications changes include :   none - complete the doxycycline     Return if symptoms worsen or fail to improve.

## 2023-01-16 NOTE — Progress Notes (Signed)
Subjective:    Patient ID: Monica [name], female    DOB: [birth year], 77 y.o.   MRN: [id number]      HPI Monica [name] is here for  Chief Complaint  Patient presents with   Cough    Cough, fatigue, headache   Lesions on legs   Has h/o bronchiectasis, RA on methotrexate ( on hold).    Lesions on legs -- went to urgent care 9/1 for this.  On legs, spreading to torso and forearms.  Used hydrocortisone w/o improvement.  No obvious cause.  Had macular lesions on legs, less on torso and arms.  Concern for systemic rxn - prescribed prednisone 20 mg for 5 days.   URI - seen at urgent care 9/7.  Symptoms started 9/5.  Cough, congestion, fatigue, HA, nausea, low grade fever.  Covid and flu tests negative.  Cxr thought to be negative.  Likely viral illness.   No rx given.  Radiology read xray with worsening atypical infection b/l and doxycycline started   Has not had any other fever.  Cough productive of yellow sputum - not better.  No wheeze or SOB.  Had severe pain on right side of sternum - last a while - maybe an hour - none since.  Occurred when she was watching tv.     Medications and allergies reviewed with patient and updated if appropriate.  Current Outpatient Medications on File Prior to Visit  Medication Sig Dispense Refill   acetaminophen (TYLENOL) 500 MG tablet Take 500 mg by mouth every 6 (six) hours as needed for moderate pain or headache.     Calcium-Magnesium-Zinc (CAL-MAG-ZINC PO) Take 1 tablet by mouth daily.     certolizumab pegol (CIMZIA) 2 X 200 MG KIT Per Rheumatology (Patient taking differently: Inject 400 mg into the skin every 30 (thirty) days. Per Rheumatology) 1 kit 1   Cholecalciferol (VITAMIN D) 1000 UNITS capsule Take 1,000 Units by mouth daily with lunch.     dextromethorphan (DELSYM) 30 MG/5ML liquid Take 30 mg by mouth at bedtime as needed for cough.      doxycycline (VIBRAMYCIN) 100 MG capsule Take 1 capsule (100 mg total) by mouth 2 (two) times daily. 20  capsule 0   ELIQUIS 5 MG TABS tablet TAKE 1 TABLET TWICE A DAY 180 tablet 3   Estradiol 10 MCG TABS Place 10 mcg vaginally 3 (three) times a week.     fluticasone (FLONASE ALLERGY RELIEF) 50 MCG/ACT nasal spray Place 1 spray into both nostrils as needed.     folic acid (FOLVITE) 1 MG tablet Take 2 mg by mouth daily with lunch.     guaiFENesin (MUCINEX) 600 MG 12 hr tablet Take 600 mg by mouth 2 (two) times daily as needed (congestion/cough).     Hydrocortisone (CORTIZONE-10 EX) Apply 1 Application topically 3 (three) times daily as needed (itching/bug bites).     Lactobacillus-Inulin (CULTURELLE DIGESTIVE HEALTH PO) Take 1 capsule by mouth in the morning.     loratadine (CLARITIN) 10 MG tablet Take 10 mg by mouth in the morning.     methotrexate (RHEUMATREX) 2.5 MG tablet Take 15 mg by mouth every Sunday. 6 tablets     metoprolol succinate (TOPROL XL) 25 MG 24 hr tablet Take 1.5 tablets (37.5 mg total) by mouth in the morning and at bedtime. 270 tablet 3   metoprolol tartrate (LOPRESSOR) 25 MG tablet Take 0.5 tablets (12.5 mg total) by mouth daily as needed. 90 tablet 3   Multiple  Vitamins-Minerals (ONE A DAY WOMEN 50 PLUS) CHEW Chew 1 tablet by mouth daily.     Polyethyl Glycol-Propyl Glycol (SYSTANE ULTRA) 0.4-0.3 % SOLN Place 1 drop into both eyes 3 (three) times daily as needed.     Respiratory Therapy Supplies (FLUTTER) DEVI Twice a day and prn as needed, may increase if feeling worse 1 each 0   Zoledronic Acid (RECLAST IV) Inject into the vein. IV yearly     calcium carbonate (TUMS EX) 750 MG chewable tablet Chew 1 tablet by mouth at bedtime as needed for heartburn. (Patient not taking: Reported on 01/10/2023)     No current facility-administered medications on file prior to visit.    Review of Systems  Constitutional:  Positive for fever (resolved).  HENT:  Negative for congestion, ear pain, sinus pain and sore throat.   Respiratory:  Positive for cough (loose yellow). Negative for chest  tightness, shortness of breath and wheezing.   Gastrointestinal:  Negative for diarrhea.  Musculoskeletal:  Negative for myalgias.  Neurological:  Positive for light-headedness and headaches (sinus headaches).       Objective:   Vitals:   01/16/23 0817  BP: 118/76  Pulse: (!) 110  Temp: 97.9 F (36.6 C)  SpO2: 99%   BP Readings from Last 3 Encounters:  01/16/23 118/76  01/11/23 136/82  01/10/23 109/64   Wt Readings from Last 3 Encounters:  01/16/23 100 lb 9.6 oz (45.6 kg)  01/10/23 102 lb 9.6 oz (46.5 kg)  11/08/22 101 lb 9.6 oz (46.1 kg)   Body mass index is 18.7 kg/m.    Physical Exam Constitutional:      General: She is not in acute distress.    Appearance: Normal appearance. She is not ill-appearing.  HENT:     Head: Normocephalic and atraumatic.     Mouth/Throat:     Mouth: Mucous membranes are moist.     Pharynx: No oropharyngeal exudate or posterior oropharyngeal erythema.  Eyes:     Conjunctiva/sclera: Conjunctivae normal.  Cardiovascular:     Rate and Rhythm: Normal rate and regular rhythm.  Pulmonary:     Effort: Pulmonary effort is normal. No respiratory distress.     Breath sounds: Normal breath sounds. No wheezing or rales.  Musculoskeletal:     Cervical back: Neck supple. No tenderness.  Lymphadenopathy:     Cervical: No cervical adenopathy.  Skin:    General: Skin is warm and dry.  Neurological:     Mental Status: She is alert.         DG Chest 2 View CLINICAL DATA:  Cough, fever.  EXAM: CHEST - 2 VIEW  COMPARISON:  Sep 12, 2021.  February 12, 2022.  FINDINGS: The heart size and mediastinal contours are within normal limits. Stable biapical scarring. Increased bibasilar coarse opacities are noted compared to prior exam suggesting worsening atypical infection. The visualized skeletal structures are unremarkable.  IMPRESSION: Increased bibasilar opacities are noted compared to prior exam suggesting worsening atypical  infection.  Electronically Signed   By: Lupita Raider M.D.   On: 01/11/2023 12:30     Assessment & Plan:    LRTI, bronchiectasis with acute exacerbation: Acute Seen in urgent care 9/7 Chest x-ray showed worsening of atypical infection-?  MAI versus other CT scanning in the past showed bronchiectasis with multifocal scarring and clustered nodularity in both lungs most consistent with chronic MAI Started on doxycycline 100 mg twice daily x 10 days, using flutter valve Has not yet needed bronchodilators  Lungs are clear ?  Improvement yet are not Would recommend continuing and completing doxycycline Continue flutter valve She does seem to be using mucus and denies any wheezing or shortness of breath so will not start bronchodilator Methotrexate on hold-continue to hold Would not recommend any other treatment at this time She will go ahead and schedule routine follow-up with pulmonary-discussed that she should have a follow-up chest x-ray in about 3 weeks to reevaluate  Rash: Seen in urgent care 9/1 for rash that was on her legs and spread to her lower abdomen, torso and to a lesser degree arms Was given prednisone There has been some improvement-still has some few residual lesions Cause unknown Minimally symptomatic-very mild itch at times Unsure of cause If rash persists would recommend seeing dermatology   I spent 20 minutes dedicated to the care of this patient on the date of this encounter including review of recent labs, imaging, ED and pulmonary notes, obtaining history, communicating with the patient,  and documenting clinical information in the EHR

## 2023-01-22 ENCOUNTER — Ambulatory Visit (INDEPENDENT_AMBULATORY_CARE_PROVIDER_SITE_OTHER): Payer: Medicare Other | Admitting: Internal Medicine

## 2023-01-22 ENCOUNTER — Encounter: Payer: Self-pay | Admitting: Internal Medicine

## 2023-01-22 VITALS — BP 110/70 | HR 105 | Temp 98.6°F | Ht 61.5 in | Wt 102.0 lb

## 2023-01-22 DIAGNOSIS — R21 Rash and other nonspecific skin eruption: Secondary | ICD-10-CM

## 2023-01-22 DIAGNOSIS — I480 Paroxysmal atrial fibrillation: Secondary | ICD-10-CM | POA: Diagnosis not present

## 2023-01-22 DIAGNOSIS — M059 Rheumatoid arthritis with rheumatoid factor, unspecified: Secondary | ICD-10-CM | POA: Diagnosis not present

## 2023-01-22 DIAGNOSIS — J479 Bronchiectasis, uncomplicated: Secondary | ICD-10-CM | POA: Diagnosis not present

## 2023-01-22 DIAGNOSIS — M810 Age-related osteoporosis without current pathological fracture: Secondary | ICD-10-CM | POA: Diagnosis not present

## 2023-01-22 NOTE — Assessment & Plan Note (Signed)
On  Cimzia and methotrexate >1 year Labs

## 2023-01-22 NOTE — Progress Notes (Signed)
Subjective:  Patient ID: Monica [name], female    DOB: 12/20/1945  Age: 77 y.o. MRN: [id number]  CC: Follow-up (3 MN[facility] F/U)   HPI  Monica [name] presents for a rash 2 d after Reclast treatment - went to UC, given Prednisone 2 wks ago.  F/u A fib, osteoporosis, cough  Outpatient Medications Prior to Visit  Medication Sig Dispense Refill   acetaminophen (TYLENOL) 500 MG tablet Take 500 mg by mouth every 6 (six) hours as needed for moderate pain or headache.     Calcium-Magnesium-Zinc (CAL-MAG-ZINC PO) Take 1 tablet by mouth daily.     certolizumab pegol (CIMZIA) 2 X 200 MG KIT Per Rheumatology (Patient taking differently: Inject 400 mg into the skin every 30 (thirty) days. Per Rheumatology) 1 kit 1   Cholecalciferol (VITAMIN D) 1000 UNITS capsule Take 1,000 Units by mouth daily with lunch.     dextromethorphan (DELSYM) 30 MG/5ML liquid Take 30 mg by mouth at bedtime as needed for cough.      doxycycline (VIBRAMYCIN) 100 MG capsule Take 1 capsule (100 mg total) by mouth 2 (two) times daily. 20 capsule 0   ELIQUIS 5 MG TABS tablet TAKE 1 TABLET TWICE A DAY 180 tablet 3   Estradiol 10 MCG TABS Place 10 mcg vaginally 3 (three) times a week.     fluticasone (FLONASE ALLERGY RELIEF) 50 MCG/ACT nasal spray Place 1 spray into both nostrils as needed.     folic acid (FOLVITE) 1 MG tablet Take 2 mg by mouth daily with lunch.     guaiFENesin (MUCINEX) 600 MG 12 hr tablet Take 600 mg by mouth 2 (two) times daily as needed (congestion/cough).     Hydrocortisone (CORTIZONE-10 EX) Apply 1 Application topically 3 (three) times daily as needed (itching/bug bites).     Lactobacillus-Inulin (CULTURELLE DIGESTIVE HEALTH PO) Take 1 capsule by mouth in the morning.     loratadine (CLARITIN) 10 MG tablet Take 10 mg by mouth in the morning.     methotrexate (RHEUMATREX) 2.5 MG tablet Take 15 mg by mouth every Sunday. 6 tablets     metoprolol succinate (TOPROL XL) 25 MG 24 hr tablet Take 1.5 tablets (37.5 mg  total) by mouth in the morning and at bedtime. 270 tablet 3   metoprolol tartrate (LOPRESSOR) 25 MG tablet Take 0.5 tablets (12.5 mg total) by mouth daily as needed. 90 tablet 3   Multiple Vitamins-Minerals (ONE A DAY WOMEN 50 PLUS) CHEW Chew 1 tablet by mouth daily.     Polyethyl Glycol-Propyl Glycol (SYSTANE ULTRA) 0.4-0.3 % SOLN Place 1 drop into both eyes 3 (three) times daily as needed.     Respiratory Therapy Supplies (FLUTTER) DEVI Twice a day and prn as needed, may increase if feeling worse 1 each 0   Zoledronic Acid (RECLAST IV) Inject into the vein. IV yearly     No facility-administered medications prior to visit.    ROS: Review of Systems  Constitutional:  Negative for activity change, appetite change, chills, fatigue and unexpected weight change.  HENT:  Negative for congestion, mouth sores and sinus pressure.   Eyes:  Negative for visual disturbance.  Respiratory:  Negative for cough and chest tightness.   Gastrointestinal:  Negative for abdominal pain and nausea.  Genitourinary:  Negative for difficulty urinating, frequency and vaginal pain.  Musculoskeletal:  Negative for back pain and gait problem.  Skin:  Negative for pallor and rash.  Neurological:  Negative for dizziness, tremors, weakness, numbness and headaches.  Psychiatric/Behavioral:  Negative for confusion and sleep disturbance. The patient is not nervous/anxious.     Objective:  BP 110/70 (BP Location: Right Arm, Patient Position: Sitting, Cuff Size: Large)   Pulse (!) 105   Temp 98.6 F (37 C) (Oral)   Ht 5' 1.5" (1.562 m)   Wt 102 lb (46.3 kg)   SpO2 97%   BMI 18.96 kg/m   BP Readings from Last 3 Encounters:  01/22/23 110/70  01/16/23 118/76  01/11/23 136/82    Wt Readings from Last 3 Encounters:  01/22/23 102 lb (46.3 kg)  01/16/23 100 lb 9.6 oz (45.6 kg)  01/10/23 102 lb 9.6 oz (46.5 kg)    Physical Exam Constitutional:      General: She is not in acute distress.    Appearance: Normal  appearance. She is well-developed.  HENT:     Head: Normocephalic.     Right Ear: External ear normal.     Left Ear: External ear normal.     Nose: Nose normal.  Eyes:     General:        Right eye: No discharge.        Left eye: No discharge.     Conjunctiva/sclera: Conjunctivae normal.     Pupils: Pupils are equal, round, and reactive to light.  Neck:     Thyroid: No thyromegaly.     Vascular: No JVD.     Trachea: No tracheal deviation.  Cardiovascular:     Rate and Rhythm: Normal rate and regular rhythm.     Heart sounds: Normal heart sounds.  Pulmonary:     Effort: No respiratory distress.     Breath sounds: No stridor. No wheezing.  Abdominal:     General: Bowel sounds are normal. There is no distension.     Palpations: Abdomen is soft. There is no mass.     Tenderness: There is no abdominal tenderness. There is no guarding or rebound.  Musculoskeletal:        General: No tenderness.     Cervical back: Normal range of motion and neck supple. No rigidity.     Right lower leg: No edema.     Left lower leg: No edema.  Lymphadenopathy:     Cervical: No cervical adenopathy.  Skin:    Findings: No erythema or rash.  Neurological:     Cranial Nerves: No cranial nerve deficit.     Motor: No abnormal muscle tone.     Coordination: Coordination normal.     Deep Tendon Reflexes: Reflexes normal.  Psychiatric:        Behavior: Behavior normal.        Thought Content: Thought content normal.        Judgment: Judgment normal.     Lab Results  Component Value Date   WBC 10.0 09/20/2022   HGB 13.2 09/20/2022   HCT 39.4 09/20/2022   PLT 388 09/20/2022   GLUCOSE 94 09/20/2022   CHOL 140 07/06/2018   TRIG 41.0 07/06/2018   HDL 62.80 07/06/2018   LDLCALC 69 07/06/2018   ALT 11 09/04/2022   AST 16 09/04/2022   NA 136 09/20/2022   K 4.5 09/20/2022   CL 101 09/20/2022   CREATININE 0.78 09/20/2022   BUN 19 09/20/2022   CO2 22 09/20/2022   TSH 3.09 05/30/2021    DG  Chest 2 View  Result Date: 01/11/2023 CLINICAL DATA:  Cough, fever. EXAM: CHEST - 2 VIEW COMPARISON:  Sep 12, 2021.  February 12, 2022. FINDINGS: The heart size and mediastinal contours are within normal limits. Stable biapical scarring. Increased bibasilar coarse opacities are noted compared to prior exam suggesting worsening atypical infection. The visualized skeletal structures are unremarkable. IMPRESSION: Increased bibasilar opacities are noted compared to prior exam suggesting worsening atypical infection. Electronically Signed   By: Lupita Raider M.D.   On: 01/11/2023 12:30    Assessment & Plan:   Problem List Items Addressed This Visit     Bronchiectasis without acute exacerbation (HCC) - Primary    On Doxy po      Osteoporosis    Per GYN New rash 2 d after Reclast treatment - went to UC, given Prednisone 2 wks ago. Resolved      Paroxysmal atrial fibrillation (HCC)    Cont on Eliquis, Metoprolol      Rheumatoid arthritis (HCC)    On  Cimzia and methotrexate >1 year Labs      Rash and nonspecific skin eruption    New rash 2 d after Reclast treatment - went to UC, given Prednisone 2 wks ago. Resolved         No orders of the defined types were placed in this encounter.     Follow-up: Return in about 6 months (around 07/22/2023) for a follow-up visit.  Sonda Primes, MD

## 2023-01-22 NOTE — Assessment & Plan Note (Signed)
Cont on Eliquis, Metoprolol

## 2023-01-22 NOTE — Assessment & Plan Note (Signed)
Per GYN New rash 2 d after Reclast treatment - went to UC, given Prednisone 2 wks ago. Resolved

## 2023-01-22 NOTE — Assessment & Plan Note (Signed)
New rash 2 d after Reclast treatment - went to UC, given Prednisone 2 wks ago. Resolved

## 2023-01-22 NOTE — Assessment & Plan Note (Signed)
On Doxy po

## 2023-01-23 ENCOUNTER — Encounter: Payer: Self-pay | Admitting: Internal Medicine

## 2023-01-26 ENCOUNTER — Encounter (HOSPITAL_COMMUNITY): Payer: Self-pay

## 2023-01-26 ENCOUNTER — Other Ambulatory Visit: Payer: Self-pay

## 2023-01-26 ENCOUNTER — Emergency Department (HOSPITAL_COMMUNITY): Payer: Medicare Other

## 2023-01-26 ENCOUNTER — Emergency Department (HOSPITAL_COMMUNITY)
Admission: EM | Admit: 2023-01-26 | Discharge: 2023-01-26 | Disposition: A | Discharge status: Home / Self Care | Payer: Medicare Other | Attending: Emergency Medicine | Admitting: Emergency Medicine

## 2023-01-26 DIAGNOSIS — R519 Headache, unspecified: Secondary | ICD-10-CM | POA: Diagnosis not present

## 2023-01-26 DIAGNOSIS — R110 Nausea: Secondary | ICD-10-CM | POA: Insufficient documentation

## 2023-01-26 DIAGNOSIS — Z7901 Long term (current) use of anticoagulants: Secondary | ICD-10-CM | POA: Insufficient documentation

## 2023-01-26 DIAGNOSIS — R5383 Other fatigue: Secondary | ICD-10-CM | POA: Insufficient documentation

## 2023-01-26 DIAGNOSIS — R0602 Shortness of breath: Secondary | ICD-10-CM | POA: Diagnosis not present

## 2023-01-26 DIAGNOSIS — Z1152 Encounter for screening for COVID-19: Secondary | ICD-10-CM | POA: Diagnosis not present

## 2023-01-26 DIAGNOSIS — Z8616 Personal history of COVID-19: Secondary | ICD-10-CM | POA: Diagnosis not present

## 2023-01-26 DIAGNOSIS — R918 Other nonspecific abnormal finding of lung field: Secondary | ICD-10-CM | POA: Diagnosis not present

## 2023-01-26 DIAGNOSIS — Z79899 Other long term (current) drug therapy: Secondary | ICD-10-CM | POA: Diagnosis not present

## 2023-01-26 DIAGNOSIS — I480 Paroxysmal atrial fibrillation: Secondary | ICD-10-CM | POA: Diagnosis not present

## 2023-01-26 DIAGNOSIS — J189 Pneumonia, unspecified organism: Secondary | ICD-10-CM | POA: Insufficient documentation

## 2023-01-26 DIAGNOSIS — R059 Cough, unspecified: Secondary | ICD-10-CM | POA: Diagnosis not present

## 2023-01-26 LAB — CBC WITH DIFFERENTIAL/PLATELET
Abs Immature Granulocytes: 0.04 10*3/uL (ref 0.00–0.07)
Basophils Absolute: 0.1 10*3/uL (ref 0.0–0.1)
Basophils Relative: 0 %
Eosinophils Absolute: 0 10*3/uL (ref 0.0–0.5)
Eosinophils Relative: 0 %
HCT: 40.6 % (ref 36.0–46.0)
Hemoglobin: 13.3 g/dL (ref 12.0–15.0)
Immature Granulocytes: 0 %
Lymphocytes Relative: 12 %
Lymphs Abs: 1.5 10*3/uL (ref 0.7–4.0)
MCH: 31.7 pg (ref 26.0–34.0)
MCHC: 32.8 g/dL (ref 30.0–36.0)
MCV: 96.7 fL (ref 80.0–100.0)
Monocytes Absolute: 1 10*3/uL (ref 0.1–1.0)
Monocytes Relative: 8 %
Neutro Abs: 10.3 10*3/uL — ABNORMAL HIGH (ref 1.7–7.7)
Neutrophils Relative %: 80 %
Platelets: 357 10*3/uL (ref 150–400)
RBC: 4.2 MIL/uL (ref 3.87–5.11)
RDW: 12.3 % (ref 11.5–15.5)
WBC: 12.9 10*3/uL — ABNORMAL HIGH (ref 4.0–10.5)
nRBC: 0 % (ref 0.0–0.2)

## 2023-01-26 LAB — BASIC METABOLIC PANEL
Anion gap: 7 (ref 5–15)
BUN: 26 mg/dL — ABNORMAL HIGH (ref 8–23)
CO2: 26 mmol/L (ref 22–32)
Calcium: 8.8 mg/dL — ABNORMAL LOW (ref 8.9–10.3)
Chloride: 99 mmol/L (ref 98–111)
Creatinine, Ser: 0.66 mg/dL (ref 0.44–1.00)
GFR, Estimated: >60 mL/min (ref >60)
Glucose, Bld: 93 mg/dL (ref 70–99)
Potassium: 4.4 mmol/L (ref 3.5–5.1)
Sodium: 132 mmol/L — ABNORMAL LOW (ref 135–145)

## 2023-01-26 LAB — URINALYSIS, W/ REFLEX TO CULTURE (INFECTION SUSPECTED)
Bilirubin Urine: NEGATIVE
Glucose, UA: NEGATIVE mg/dL
Hgb urine dipstick: NEGATIVE
Ketones, ur: NEGATIVE mg/dL
Leukocytes,Ua: NEGATIVE
Nitrite: NEGATIVE
Protein, ur: NEGATIVE mg/dL
Specific Gravity, Urine: 1.005 (ref 1.005–1.030)
pH: 7 (ref 5.0–8.0)

## 2023-01-26 LAB — RESP PANEL BY RT-PCR (RSV, FLU A&B, COVID)  RVPGX2
Influenza A by PCR: NEGATIVE
Influenza B by PCR: NEGATIVE
Resp Syncytial Virus by PCR: NEGATIVE
SARS Coronavirus 2 by RT PCR: NEGATIVE

## 2023-01-26 LAB — BRAIN NATRIURETIC PEPTIDE: B Natriuretic Peptide: 60.3 pg/mL (ref 0.0–100.0)

## 2023-01-26 MED ORDER — SODIUM CHLORIDE 0.9 % IV BOLUS
500.0000 mL | Freq: Once | INTRAVENOUS | Status: AC
  Administered 2023-01-26: 500 mL via INTRAVENOUS

## 2023-01-26 MED ORDER — AZITHROMYCIN 250 MG PO TABS: 250.0000 mg | ORAL_TABLET | Freq: Every day | ORAL | 0 refills | Status: DC

## 2023-01-26 MED ORDER — AZITHROMYCIN 250 MG PO TABS
500.0000 mg | ORAL_TABLET | Freq: Once | ORAL | Status: AC
  Administered 2023-01-26: 500 mg via ORAL
  Filled 2023-01-26: qty 2

## 2023-01-26 NOTE — Discharge Instructions (Addendum)
Your chest x-ray, shows improvement of your pneumonia from before. A repeat x-ray is recommended in 6 weeks to ensure resolution of infection A prescription from Azithromycin was sent to your pharmacy. Please complete full course of prescription.   Follow up with your pulmonologist for reevaluation of your symptoms.  Get help right away if: Your shortness of breath becomes worse. Your chest pain increases. Your sickness becomes worse, especially if you are an older adult or have a weak immune system. You cough up blood.

## 2023-01-26 NOTE — ED Provider Notes (Signed)
[place] [facility] DEPARTMENT AT [facility] Provider Note   CSN: [id number] Arrival date & time: 01/26/23  1013     History  Chief Complaint  Patient presents with   Shortness of Breath   Fatigue    Monica [name] is a 77 y.o. female with a history of bronchiectasis, paroxysmal A-fib, rheumatoid arthritis, and GERD who presents the ED today for multiple concerns.  Patient reports that she was diagnosed with atypical pneumonia 01/11/23.  She was on a 10-day course of Doxycycline that she completed last week. Since then, she reports shortness of breath with activity and fatigue.  She denies shortness of breath at rest, fever, acute cough, or wheezing.  She states that she has had persistent nausea and headache for the past 3 weeks, prior to her atypical pneumonia diagnosis.  She denies vomiting, diarrhea, or urinary symptoms.  Denies any other concerns or complaints at this time.    Home Medications Prior to Admission medications   Medication Sig Start Date End Date Taking? Authorizing Provider  azithromycin (ZITHROMAX) 250 MG tablet Take 1 tablet (250 mg total) by mouth daily. 01/27/23  Yes Pricilla Loveless, MD  acetaminophen (TYLENOL) 500 MG tablet Take 500 mg by mouth every 6 (six) hours as needed for moderate pain or headache.    [provider]  Calcium-Magnesium-Zinc (CAL-MAG-ZINC PO) Take 1 tablet by mouth daily.    [provider]  certolizumab pegol (CIMZIA) 2 X 200 MG KIT Per Rheumatology Patient taking differently: Inject 400 mg into the skin every 30 (thirty) days. Per Rheumatology 02/28/22   Plotnikov, Georgina Quint, MD  Cholecalciferol (VITAMIN D) 1000 UNITS capsule Take 1,000 Units by mouth daily with lunch.    [provider]  dextromethorphan (DELSYM) 30 MG/5ML liquid Take 30 mg by mouth at bedtime as needed for cough.     [provider]  doxycycline (VIBRAMYCIN) 100 MG capsule Take 1 capsule (100 mg total) by mouth 2 (two) times  daily. 01/11/23   Radford Pax, NP  ELIQUIS 5 MG TABS tablet TAKE 1 TABLET TWICE A DAY 11/01/22   Croitoru, Mihai, MD  Estradiol 10 MCG TABS Place 10 mcg vaginally 3 (three) times a week.    [provider]  fluticasone (FLONASE ALLERGY RELIEF) 50 MCG/ACT nasal spray Place 1 spray into both nostrils as needed.    [provider]  folic acid (FOLVITE) 1 MG tablet Take 2 mg by mouth daily with lunch.    [provider]  guaiFENesin (MUCINEX) 600 MG 12 hr tablet Take 600 mg by mouth 2 (two) times daily as needed (congestion/cough).    [provider]  Hydrocortisone (CORTIZONE-10 EX) Apply 1 Application topically 3 (three) times daily as needed (itching/bug bites).    [provider]  Lactobacillus-Inulin (CULTURELLE DIGESTIVE HEALTH PO) Take 1 capsule by mouth in the morning.    [provider]  loratadine (CLARITIN) 10 MG tablet Take 10 mg by mouth in the morning.    [provider]  methotrexate (RHEUMATREX) 2.5 MG tablet Take 15 mg by mouth every Sunday. 6 tablets 08/28/21   [provider]  metoprolol succinate (TOPROL XL) 25 MG 24 hr tablet Take 1.5 tablets (37.5 mg total) by mouth in the morning and at bedtime. 01/13/23   Croitoru, Mihai, MD  metoprolol tartrate (LOPRESSOR) 25 MG tablet Take 0.5 tablets (12.5 mg total) by mouth daily as needed. 11/07/21   Plotnikov, Georgina Quint, MD  Multiple Vitamins-Minerals (ONE A  DAY WOMEN 50 PLUS) CHEW Chew 1 tablet by mouth daily.    [provider]  Polyethyl Glycol-Propyl Glycol (SYSTANE ULTRA) 0.4-0.3 % SOLN Place 1 drop into both eyes 3 (three) times daily as needed.    [provider]  Respiratory Therapy Supplies (FLUTTER) DEVI Twice a day and prn as needed, may increase if feeling worse 01/30/18   Coral Ceo, NP  Zoledronic Acid (RECLAST IV) Inject into the vein. IV yearly    [provider]      Allergies    Amoxicillin, Colchicine, Moxifloxacin,  Nitrofurantoin, Albuterol, Amoxicillin-pot clavulanate, Avelox [moxifloxacin hcl in nacl], Benzonatate, Ciprofloxacin, Macrobid [nitrofurantoin macrocrystal], and Septra [sulfamethoxazole-trimethoprim]    Review of Systems   Review of Systems  Respiratory:  Positive for shortness of breath.   All other systems reviewed and are negative.   Physical Exam Updated Vital Signs BP (!) 141/90   Pulse 91   Temp 98.3 F (36.8 C) (Oral)   Resp 11   SpO2 100%  Physical Exam Vitals and nursing note reviewed.  Constitutional:      General: She is not in acute distress.    Appearance: Normal appearance.  HENT:     Head: Normocephalic and atraumatic.     Mouth/Throat:     Mouth: Mucous membranes are moist.  Eyes:     Conjunctiva/sclera: Conjunctivae normal.     Pupils: Pupils are equal, round, and reactive to light.  Cardiovascular:     Rate and Rhythm: Normal rate and regular rhythm.     Pulses: Normal pulses.     Heart sounds: Normal heart sounds.  Pulmonary:     Effort: Pulmonary effort is normal.     Breath sounds: Normal breath sounds.  Abdominal:     Palpations: Abdomen is soft.     Tenderness: There is no abdominal tenderness.  Skin:    General: Skin is warm and dry.     Findings: No rash.  Neurological:     General: No focal deficit present.     Mental Status: She is alert.     Sensory: No sensory deficit.     Motor: No weakness.  Psychiatric:        Mood and Affect: Mood normal.        Behavior: Behavior normal.    ED Results / Procedures / Treatments   Labs (all labs ordered are listed, but only abnormal results are displayed) Labs Reviewed  CBC WITH DIFFERENTIAL/PLATELET - Abnormal; Notable for the following components:      Result Value   WBC 12.9 (*)    Neutro Abs 10.3 (*)    All other components within normal limits  BASIC METABOLIC PANEL - Abnormal; Notable for the following components:   Sodium 132 (*)    BUN 26 (*)    Calcium 8.8 (*)    All other  components within normal limits  URINALYSIS, W/ REFLEX TO CULTURE (INFECTION SUSPECTED) - Abnormal; Notable for the following components:   Color, Urine STRAW (*)    Bacteria, UA RARE (*)    All other components within normal limits  RESP PANEL BY RT-PCR (RSV, FLU A&B, COVID)  RVPGX2  BRAIN NATRIURETIC PEPTIDE    EKG None  Radiology DG Chest 2 View  Result Date: 01/26/2023 CLINICAL DATA:  cough. 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 No pleural effusion. No pneumothorax.  Revisualization of ill-defined bilateral reticulonodular opacities. Similar appearance of biapical pleuroparenchymal scarring. Increased conspicuity of a nodular opacity of the RIGHT upper lung. Overall extent of bilateral opacities is slightly increased since 2023. Visualized abdomen is unremarkable. Mild degenerative changes of the thoracolumbar junction. IMPRESSION: Slight interval increase in extent of bilateral reticulonodular opacities since 2023 with a more focal opacity of the RIGHT upper lung. 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 Recommend follow-up PA and lateral chest radiograph in 6 weeks after appropriate treatment to assess for resolution of more nodular opacity. Alternatively, follow-up chest CT could be performed. Electronically Signed   By: Meda Klinefelter M.D.   On: 01/26/2023 13:17    Procedures Procedures: not indicated.   Medications Ordered in ED Medications  sodium chloride 0.9 % bolus 500 mL (500 mLs Intravenous New Bag/Given 01/26/23 1623)  azithromycin (ZITHROMAX) tablet 500 mg (500 mg Oral Given 01/26/23 1623)    ED Course/ Medical Decision Making/ A&P                                 Medical Decision Making Amount and/or Complexity of Data Reviewed Labs:  ordered. Radiology: ordered.  Risk Prescription drug management.   This patient presents to the ED for concern of atypical pneumonia and orthopnea, this involves an extensive number of treatment options, and is a complaint that carries with it a high risk of complications and morbidity.   Differential diagnosis includes: Persistent pneumonia, bronchitis, failure, COVID, flu, RSV, other viral illness, pleural effusion, etc.   Comorbidities  See HPI above   Additional History  Additional history obtained from previous urgent care and primary care records.   Cardiac Monitoring / EKG  The patient was maintained on a cardiac monitor.  I personally viewed and interpreted the cardiac monitored which showed: sinus rhythm with a heart rate of 97 bpm.   Lab Tests  I ordered and personally interpreted labs.  The pertinent results include:   BMP shows elevated BUN of 26 otherwise within normal limits - no acute electrolyte abnormality or AKI. Elevated WBC of 12.9 otherwise within normal limits - no acute anemia. BNP is 60.3 - within normal limits Show suprapubic area but no leukocytes or nitrates. Negative respiratory panel   Imaging Studies  I ordered imaging studies including CXR  I independently visualized and interpreted imaging which showed: Relatively unchanged imaging compared to that on 01/11/23. I agree with the radiologist interpretation   Problem List / ED Course / Critical Interventions / Medication Management  Atypical pneumonia, headache, and nausea. I ordered medications including: NS for elevated BUN Azithromycin for atypical pneumonia  I have reviewed the patients home medicines and have made adjustments as needed   Social Determinants of Health  Physical activity   Test / Admission - Considered  Findings discussed with patient. She is hemodynamically stable and safe for discharge home. Azithromycin sent to the pharmacy. Return precautions provided.         Final Clinical Impression(s) / ED Diagnoses Final diagnoses:  Atypical pneumonia    Rx / DC Orders ED Discharge Orders          Ordered    azithromycin (ZITHROMAX) 250 MG tablet  Daily        01/26/23 1619              Maxwell Marion, PA-C 01/26/23 1842    Pricilla Loveless,  MD 01/26/23 2233

## 2023-01-26 NOTE — ED Triage Notes (Signed)
Pt has recent infection that PCP treated with doxycycline. She finished the full course of medication Monday.   Pt  states she is experiencing increasing fatigue and difficulty breathing with exertion.  Pt denies chest pain,  endorses pulmonary tightness

## 2023-01-28 ENCOUNTER — Other Ambulatory Visit: Payer: Self-pay | Admitting: Internal Medicine

## 2023-01-28 ENCOUNTER — Telehealth: Payer: Self-pay

## 2023-01-28 ENCOUNTER — Encounter: Payer: Self-pay | Admitting: Emergency Medicine

## 2023-01-28 ENCOUNTER — Encounter: Payer: Self-pay | Admitting: Internal Medicine

## 2023-01-28 ENCOUNTER — Ambulatory Visit: Payer: Medicare Other | Admitting: Emergency Medicine

## 2023-01-28 VITALS — BP 120/70 | HR 98 | Ht 61.5 in | Wt 100.8 lb

## 2023-01-28 DIAGNOSIS — J479 Bronchiectasis, uncomplicated: Secondary | ICD-10-CM

## 2023-01-28 MED ORDER — TRIAMCINOLONE ACETONIDE 0.1 % EX CREA: 1.0000 | TOPICAL_CREAM | Freq: Three times a day (TID) | CUTANEOUS | 1 refills | Status: DC

## 2023-01-28 MED ORDER — METHYLPREDNISOLONE 4 MG PO TBPK: ORAL_TABLET | ORAL | 0 refills | Status: DC

## 2023-01-28 NOTE — Patient Instructions (Addendum)
Okay to stop your azithromycin at this time We will perform a CT scan of the chest in about 1 month to compare with priors. We will give you a sputum container to take home.  We would like to get a sample to send for mycobacterial culture We should consider repeat bronchoscopy in the future depending on your symptoms are doing and on your repeat CT scan results Continue your loratadine, fluticasone nasal spray and Mucinex as you have been using them Agree with using over-the-counter symptomatic relief for congestion, headache, nausea. Follow Dr. Delton Coombes in 1 month or next available after your CT chest so we can review those results together.

## 2023-01-28 NOTE — Transitions of Care (Post Inpatient/ED Visit) (Signed)
01/28/2023  Name: Monica [name] MRN: [id number] DOB: Dec 08, 1945  Today's TOC FU Call Status: Today's TOC FU Call Status:: Successful TOC FU Call Completed TOC FU Call Complete Date: 01/28/23 Patient's Name and Date of Birth confirmed.  Transition Care Management Follow-up Telephone Call Date of Discharge: 01/26/23 Discharge Facility: [facility]) Type of Discharge: Emergency Department Reason for ED Visit: Other: How have you been since you were released from the hospital?: Better Any questions or concerns?: No  Items Reviewed: Did you receive and understand the discharge instructions provided?: Yes Medications obtained,verified, and reconciled?: Yes (Medications Reviewed) Any new allergies since your discharge?: No Dietary orders reviewed?: NA Do you have support at home?: No  Medications Reviewed Today: Medications Reviewed Today   Medications were not reviewed in this encounter     Home Care and Equipment/Supplies: Were Home Health Services Ordered?: No Any new equipment or medical supplies ordered?: No  Functional Questionnaire: Do you need assistance with bathing/showering or dressing?: No Do you need assistance with meal preparation?: No Do you need assistance with eating?: No Do you have difficulty maintaining continence: No Do you need assistance with getting out of bed/getting out of a chair/moving?: No Do you have difficulty managing or taking your medications?: No  Follow up appointments reviewed: PCP Follow-up appointment confirmed?: No MD Provider Line [phone] Given: Yes Specialist Hospital Follow-up appointment confirmed?: Yes Date of Specialist follow-up appointment?: 01/28/23 Follow-Up Specialty Provider:: Dr. Delton Coombes Do you need transportation to your follow-up appointment?: No Do you understand care options if your condition(s) worsen?: Yes-patient verbalized understanding    SIGNATURE: Reather Laurence, RMA

## 2023-01-28 NOTE — Assessment & Plan Note (Signed)
Recent flaring symptoms, question brought on by viral URI.  Some response to doxycycline but still with headache, fatigue, etc.  Question whether she is colonized with Mycobacterium avium.  Her previous cultures have not confirmed.  I suspect she needs a repeat bronchoscopy.  I will repeat her CT chest first, plan to do this in late October and then follow-up.  I will give her a sputum container to try to get an AFB culture.  Suspect that she will be unable to produce and will require bronchoscopy.

## 2023-01-28 NOTE — Addendum Note (Signed)
Addended by: Leslye Peer on: 01/28/2023 11:27 AM   Modules accepted: Level of Service

## 2023-01-28 NOTE — Progress Notes (Signed)
Allergy to doxycycline

## 2023-01-28 NOTE — Progress Notes (Signed)
Subjective:    Patient ID: Monica [name], female    DOB: 06/28/1945, 77 y.o.   MRN: [id number]  HPI  ROV 03/20/2022 --77 year old woman with bronchiectasis and chronic cough, upper airway irritation syndrome without any evidence of obstruction on methacholine.  She is colonized with Pseudomonas.  Also diagnosed with rheumatoid arthritis and on methotrexate, started on Cimzia since I last saw her. She has overall been doing fairly well. She has had some increased cough and overall inflammation since she had her covid shot last month. She is coughing up some pale yellow, sometimes associated with GERD. She is taking pepcid 1-2x a day. On loratadine, fluticasone NS.   CT chest 02/12/2022 reviewed by me, shows stable diffuse cylindrical bronchiectasis with some associated micronodularity, unchanged  ROV 07/23/22 --Monica [name] is 22 with a history of bronchiectasis (immunosuppression for rheumatoid arthritis), upper airway irritation syndrome and associated chronic cough in the setting of rhinitis and GERD.  No obstruction noted on a methacholine challenge.  She is colonized with Pseudomonas.  Most recent CT chest 02/12/2022 was stable. She is flutter valve approximately 2-3x a day. She remains on pepcid. Loratadine, [place], flonase.  She has been having increased cough, after exposure to new insulation at her home. Improved some after the insulation was removed. She has also noticed some more recent low grade fever, nausea, HA. Unclear whether she had any exposures. COVID negative. Cough can produce some loose yellow mucous. Usually associated w globus sensation.    Acute office visit 01/28/2023 --77 year old woman with bronchiectasis (colonized Pseudomonas) and chronic cough, chronic upper airway irritation syndrome.  She does not have any evidence of obstructive disease on a methacholine challenge in the past.  She has rheumatoid arthritis and is on immunosuppression She has been having a lot of trouble  over the last 3 to 4 weeks.  She had a rash following Reclast 9/1, was treated with steroids for this. Then cough and an apparent viral illness 4 days later. Dealing w  with associated cough and flaring bronchiectasis - was treated w doxycycline thru 9/16.  She continued to deal w nausea, HA, fatigue, 'feels bad". No change in cough or sputum. Started on azithro 9/22.   Chest x-ray 01/11/2023 with some increased bibasilar opacities, questionable pneumonia.  Similar findings on a repeat chest x-ray 01/26/2023.                                                                                                                                                                          Review of Systems As per history of present illness     Objective:   Physical Exam Vitals:   01/28/23 1058  BP: 120/70  Pulse: 98  SpO2: 97%  Weight: 100 lb 12.8 oz (45.7 kg)  Height: 5' 1.5" (1.562 m)   'Gen: Pleasant, thin woman, well-appearing, in no distress,  normal affect  ENT: No lesions,  mouth clear,  oropharynx clear, no postnasal drip  Neck: No JVD, no stridor  Lungs: No use of accessory muscles, clear without rales or rhonchi, no wheeze   Cardiovascular: RRR, heart sounds normal, no murmur or gallops, no peripheral edema  Musculoskeletal: No deformities, no cyanosis or clubbing  Neuro: alert, non focal  Skin: Warm, no lesions or rashes        Assessment & Plan:  Bronchiectasis without acute exacerbation (HCC) Recent flaring symptoms, question brought on by viral URI.  Some response to doxycycline but still with headache, fatigue, etc.  Question whether she is colonized with Mycobacterium avium.  Her previous cultures have not confirmed.  I suspect she needs a repeat bronchoscopy.  I will repeat her CT chest first, plan to do this in late October and then follow-up.  I will give her a sputum container to try to get an AFB culture.  Suspect that she will be unable to produce and will require  bronchoscopy.  Time spent 30 minutes  Levy Pupa, MD, PhD 01/28/2023, 11:26 AM [facility] [phone] or if no answer [phone]

## 2023-02-03 DIAGNOSIS — H6991 Unspecified Eustachian tube disorder, right ear: Secondary | ICD-10-CM | POA: Diagnosis not present

## 2023-02-03 DIAGNOSIS — H6121 Impacted cerumen, right ear: Secondary | ICD-10-CM | POA: Diagnosis not present

## 2023-02-03 DIAGNOSIS — H903 Sensorineural hearing loss, bilateral: Secondary | ICD-10-CM | POA: Diagnosis not present

## 2023-02-03 DIAGNOSIS — Z9622 Myringotomy tube(s) status: Secondary | ICD-10-CM | POA: Diagnosis not present

## 2023-02-05 DIAGNOSIS — Z79899 Other long term (current) drug therapy: Secondary | ICD-10-CM | POA: Diagnosis not present

## 2023-02-05 DIAGNOSIS — M0579 Rheumatoid arthritis with rheumatoid factor of multiple sites without organ or systems involvement: Secondary | ICD-10-CM | POA: Diagnosis not present

## 2023-02-06 NOTE — Progress Notes (Signed)
  Electrophysiology Office Follow up Visit Note:    Date:  02/07/2023   ID:  Monica [name], DOB November 14, 1945, MRN [id number]  PCP:  Tresa Garter, MD  [facility] Cardiologist:  Thurmon Fair, MD  [facility] Electrophysiologist:  Lanier Prude, MD    Interval History:    Monica [name] is a 77 y.o. female who presents for a follow up visit.   She had an A-fib ablation on October 10, 2022 during which the veins and posterior wall were isolated. She still Ricky on November 08, 2022 and was maintaining sinus rhythm.  She takes Eliquis for stroke prophylaxis.  She has been doing well.  Prior to the ablation, her episodes of atrial fibrillation were coming multiple times per week and lasted for 2 to 4-1/2 hours at the time.  Now she has only had 2 episodes of atrial fibrillation that lasted only 5 to 10 minutes since the procedure.  She is very pleased with the results.  She continues to take Eliquis.     Past medical, surgical, social and family history were reviewed.  ROS:   Please see the history of present illness.    All other systems reviewed and are negative.  EKGs/Labs/Other Studies Reviewed:    The following studies were reviewed today:   EKG Interpretation Date/Time:  Friday February 07 2023 10:48:49 EDT Ventricular Rate:  99 PR Interval:  166 QRS Duration:  80 QT Interval:  336 QTC Calculation: 431 R Axis:   78  Text Interpretation: Normal sinus rhythm Normal ECG Confirmed by Steffanie Dunn [phone]) on 02/07/2023 10:58:07 AM    Physical Exam:    VS:  BP 126/74   Pulse 100   Ht 5' 1.5" (1.562 m)   Wt 99 lb (44.9 kg)   SpO2 98%   BMI 18.40 kg/m     Wt Readings from Last 3 Encounters:  02/07/23 99 lb (44.9 kg)  01/28/23 100 lb 12.8 oz (45.7 kg)  01/22/23 102 lb (46.3 kg)     GEN:  Well nourished, well developed in no acute distress CARDIAC: RRR, no murmurs, rubs, gallops RESPIRATORY:  Clear to auscultation without rales, wheezing or rhonchi        ASSESSMENT:    1. Paroxysmal atrial fibrillation (HCC)    PLAN:    In order of problems listed above:  #Atrial fibrillation Doing well after her catheter ablation on October 10, 2022.  Maintaining sinus rhythm. Continue Eliquis for stroke prophylaxis Continue metoprolol succinate, reduced dose to 25 mg by mouth twice daily  Follow-up 1 year with me.     Signed, Steffanie Dunn, MD, [place], [facility] 02/07/2023 11:04 AM    Electrophysiology [facility]

## 2023-02-07 ENCOUNTER — Encounter: Payer: Self-pay | Admitting: Cardiology

## 2023-02-07 ENCOUNTER — Ambulatory Visit: Payer: Medicare Other | Attending: Cardiology | Admitting: Cardiology

## 2023-02-07 VITALS — BP 126/74 | HR 100 | Ht 61.5 in | Wt 99.0 lb

## 2023-02-07 DIAGNOSIS — I480 Paroxysmal atrial fibrillation: Secondary | ICD-10-CM | POA: Diagnosis not present

## 2023-02-07 MED ORDER — METOPROLOL SUCCINATE ER 25 MG PO TB24: 25.0000 mg | ORAL_TABLET | Freq: Two times a day (BID) | ORAL | 3 refills | Status: DC

## 2023-02-07 NOTE — Patient Instructions (Signed)
Medication Instructions:  Your physician has recommended you make the following change in your medication:  1) DECREASE metoprolol succinate to 25 mg twice daily *If you need a refill on your cardiac medications before your next appointment, please call your pharmacy*   Follow-Up: At [facility], you and your health needs are our priority.  As part of our continuing mission to provide you with exceptional heart care, we have created designated Provider Care Teams.  These Care Teams include your primary Cardiologist (physician) and Advanced Practice Providers (APPs -  Physician Assistants and Nurse Practitioners) who all work together to provide you with the care you need, when you need it.  Your next appointment:   1 year  Provider:   Dr. Steffanie Dunn

## 2023-02-14 ENCOUNTER — Other Ambulatory Visit (HOSPITAL_BASED_OUTPATIENT_CLINIC_OR_DEPARTMENT_OTHER): Payer: Self-pay

## 2023-02-14 DIAGNOSIS — Z23 Encounter for immunization: Secondary | ICD-10-CM | POA: Diagnosis not present

## 2023-02-14 MED ORDER — COVID-19 MRNA VAC-TRIS(PFIZER) 30 MCG/0.3ML IM SUSY
0.3000 mL | PREFILLED_SYRINGE | Freq: Once | INTRAMUSCULAR | 0 refills | Status: AC
  Filled 2023-02-14: qty 0.3, 1d supply, fill #0

## 2023-02-27 ENCOUNTER — Ambulatory Visit: Payer: Medicare Other | Admitting: Emergency Medicine

## 2023-02-27 ENCOUNTER — Encounter: Payer: Self-pay | Admitting: Emergency Medicine

## 2023-02-27 ENCOUNTER — Ambulatory Visit
Admission: RE | Admit: 2023-02-27 | Discharge: 2023-02-27 | Disposition: A | Discharge status: Home / Self Care | Payer: Medicare Other | Source: Ambulatory Visit | Attending: Emergency Medicine | Admitting: Emergency Medicine

## 2023-02-27 VITALS — BP 104/67 | HR 100 | Temp 97.6°F | Ht 61.5 in | Wt 102.6 lb

## 2023-02-27 DIAGNOSIS — Z23 Encounter for immunization: Secondary | ICD-10-CM

## 2023-02-27 DIAGNOSIS — J479 Bronchiectasis, uncomplicated: Secondary | ICD-10-CM

## 2023-02-27 DIAGNOSIS — I700 Atherosclerosis of aorta: Secondary | ICD-10-CM | POA: Diagnosis not present

## 2023-02-27 DIAGNOSIS — J301 Allergic rhinitis due to pollen: Secondary | ICD-10-CM

## 2023-02-27 NOTE — Addendum Note (Signed)
Addended by: Delrae Rend on: 02/27/2023 01:45 PM   Modules accepted: Orders

## 2023-02-27 NOTE — Progress Notes (Signed)
Subjective:    Patient ID: Monica [name], female    DOB: 06/24/1945, 77 y.o.   MRN: [id number]  HPI  ROV 02/27/2023 --follow-up visit for 77 year old woman on immunosuppression for RA with a history of chronic cough in the setting of bronchiectasis (pseudomonal colonization), upper airway irritation syndrome.  She has had a reassuring methacholine in the past.  I saw her about 1 month ago with flaring symptoms, treated with doxycycline but still with constitutional symptoms.  Questions AFB colonization.  We repeated her CT chest today.  Currently managed on methotrexate.  She is on loratadine fluticasone nasal spray as needed. Guaifenesin prn.  She continues to be tired all the time, knows that she is on meds that can cause fatigue. Consider also long COVID effects. She is coughing daily, usually later in the day. Clears yellow mucous. Uses flutter reliably.   CT scan of the chest 02/27/2023 reviewed by me, shows scattered cylindrical bronchiectasis with associated scar and some areas of surrounding groundglass.  Especially prominent in the right middle lobe and lingula.  Little to no change compared with CT chest from 02/12/2022                                                                                                                                                                        Review of Systems As per history of present illness     Objective:   Physical Exam Vitals:   02/27/23 1305  BP: 104/67  Pulse: 100  Temp: 97.6 F (36.4 C)  TempSrc: Oral  SpO2: 98%  Weight: 102 lb 9.6 oz (46.5 kg)  Height: 5' 1.5" (1.562 m)   'Gen: Pleasant, thin woman, well-appearing, in no distress,  normal affect  ENT: No lesions,  mouth clear,  oropharynx clear, no postnasal drip  Neck: No JVD, no stridor  Lungs: No use of accessory muscles, clear without rales or rhonchi, no wheeze   Cardiovascular: RRR, heart sounds normal, no murmur or gallops, no peripheral  edema  Musculoskeletal: No deformities, no cyanosis or clubbing  Neuro: alert, non focal  Skin: Warm, no lesions or rashes        Assessment & Plan:  Bronchiectasis without acute exacerbation (HCC) She does have a significant mucus burden and cough burden.  Seems to be at baseline.  Her CT chest was stable.  Plan to continue same regimen.  I will defer bronchoscopy for now.  Treating her with antibiotics for any relevant organism will be quite difficult because she tolerates poorly.   Try taking your guaifenesin 600 mg twice a day every day to see if this helps loosen mucus and facilitate effective cough We reviewed your CT scan of the chest today.  It  is stable compared with 02/2022.  Good news. We will hold off on bronchoscopy at this time Flu shot today.  COVID-19 vaccines up-to-date Would recommend the RSV vaccine as well Follow with Dr. Delton Coombes in about 6 months, sooner if you have any problems.  Seasonal allergic rhinitis Please continue your loratadine 10 mg once daily Continue fluticasone nasal spray as you have been using it   Time spent 30 minutes  Levy Pupa, MD, PhD 02/27/2023, 1:35 PM [facility] [phone] or if no answer [phone]

## 2023-02-27 NOTE — Assessment & Plan Note (Signed)
She does have a significant mucus burden and cough burden.  Seems to be at baseline.  Her CT chest was stable.  Plan to continue same regimen.  I will defer bronchoscopy for now.  Treating her with antibiotics for any relevant organism will be quite difficult because she tolerates poorly.   Try taking your guaifenesin 600 mg twice a day every day to see if this helps loosen mucus and facilitate effective cough We reviewed your CT scan of the chest today.  It is stable compared with 02/2022.  Good news. We will hold off on bronchoscopy at this time Flu shot today.  COVID-19 vaccines up-to-date Would recommend the RSV vaccine as well Follow with Dr. Delton Coombes in about 6 months, sooner if you have any problems.

## 2023-02-27 NOTE — Patient Instructions (Addendum)
Please continue your loratadine 10 mg once daily Continue fluticasone nasal spray as you have been using it Try taking your guaifenesin 600 mg twice a day every day to see if this helps loosen mucus and facilitate effective cough We reviewed your CT scan of the chest today.  It is stable compared with 02/2022.  Good news. We will hold off on bronchoscopy at this time Flu shot today.  COVID-19 vaccines up-to-date Would recommend the RSV vaccine as well Follow with Dr. Delton Coombes in about 6 months, sooner if you have any problems.

## 2023-02-27 NOTE — Assessment & Plan Note (Signed)
Please continue your loratadine 10 mg once daily Continue fluticasone nasal spray as you have been using it

## 2023-02-28 DIAGNOSIS — Z9622 Myringotomy tube(s) status: Secondary | ICD-10-CM | POA: Diagnosis not present

## 2023-02-28 DIAGNOSIS — H6121 Impacted cerumen, right ear: Secondary | ICD-10-CM | POA: Diagnosis not present

## 2023-02-28 DIAGNOSIS — H903 Sensorineural hearing loss, bilateral: Secondary | ICD-10-CM | POA: Diagnosis not present

## 2023-03-10 ENCOUNTER — Ambulatory Visit: Payer: Medicare Other | Admitting: Adult Health

## 2023-03-12 DIAGNOSIS — M0579 Rheumatoid arthritis with rheumatoid factor of multiple sites without organ or systems involvement: Secondary | ICD-10-CM | POA: Diagnosis not present

## 2023-03-17 DIAGNOSIS — Z681 Body mass index (BMI) 19 or less, adult: Secondary | ICD-10-CM | POA: Diagnosis not present

## 2023-03-17 DIAGNOSIS — M0579 Rheumatoid arthritis with rheumatoid factor of multiple sites without organ or systems involvement: Secondary | ICD-10-CM | POA: Diagnosis not present

## 2023-03-17 DIAGNOSIS — M1991 Primary osteoarthritis, unspecified site: Secondary | ICD-10-CM | POA: Diagnosis not present

## 2023-03-17 DIAGNOSIS — M25532 Pain in left wrist: Secondary | ICD-10-CM | POA: Diagnosis not present

## 2023-03-17 DIAGNOSIS — Z79899 Other long term (current) drug therapy: Secondary | ICD-10-CM | POA: Diagnosis not present

## 2023-03-28 ENCOUNTER — Other Ambulatory Visit (HOSPITAL_BASED_OUTPATIENT_CLINIC_OR_DEPARTMENT_OTHER): Payer: Self-pay

## 2023-03-28 ENCOUNTER — Other Ambulatory Visit (HOSPITAL_COMMUNITY): Payer: Self-pay

## 2023-03-28 MED ORDER — AREXVY 120 MCG/0.5ML IM SUSR
0.5000 mL | Freq: Once | INTRAMUSCULAR | 0 refills | Status: AC
  Filled 2023-03-28: qty 0.5, 1d supply, fill #0

## 2023-03-28 MED ORDER — AREXVY 120 MCG/0.5ML IM SUSR
INTRAMUSCULAR | 0 refills | Status: DC
  Filled 2023-03-28: qty 1, 1d supply, fill #0

## 2023-04-09 DIAGNOSIS — M0579 Rheumatoid arthritis with rheumatoid factor of multiple sites without organ or systems involvement: Secondary | ICD-10-CM | POA: Diagnosis not present

## 2023-05-13 DIAGNOSIS — Z79899 Other long term (current) drug therapy: Secondary | ICD-10-CM | POA: Diagnosis not present

## 2023-05-13 DIAGNOSIS — M0579 Rheumatoid arthritis with rheumatoid factor of multiple sites without organ or systems involvement: Secondary | ICD-10-CM | POA: Diagnosis not present

## 2023-05-15 DIAGNOSIS — Z111 Encounter for screening for respiratory tuberculosis: Secondary | ICD-10-CM | POA: Diagnosis not present

## 2023-05-15 DIAGNOSIS — M0579 Rheumatoid arthritis with rheumatoid factor of multiple sites without organ or systems involvement: Secondary | ICD-10-CM | POA: Diagnosis not present

## 2023-05-15 DIAGNOSIS — R5383 Other fatigue: Secondary | ICD-10-CM | POA: Diagnosis not present

## 2023-05-15 DIAGNOSIS — Z79899 Other long term (current) drug therapy: Secondary | ICD-10-CM | POA: Diagnosis not present

## 2023-05-23 DIAGNOSIS — H60313 Diffuse otitis externa, bilateral: Secondary | ICD-10-CM | POA: Diagnosis not present

## 2023-05-26 ENCOUNTER — Ambulatory Visit (INDEPENDENT_AMBULATORY_CARE_PROVIDER_SITE_OTHER): Payer: Medicare Other | Admitting: Family Medicine

## 2023-05-26 VITALS — BP 118/64 | HR 72 | Temp 97.7°F | Resp 20 | Ht 61.5 in | Wt 100.0 lb

## 2023-05-26 DIAGNOSIS — I480 Paroxysmal atrial fibrillation: Secondary | ICD-10-CM | POA: Diagnosis not present

## 2023-05-26 DIAGNOSIS — J0100 Acute maxillary sinusitis, unspecified: Secondary | ICD-10-CM | POA: Diagnosis not present

## 2023-05-26 DIAGNOSIS — R7989 Other specified abnormal findings of blood chemistry: Secondary | ICD-10-CM

## 2023-05-26 MED ORDER — DOXYCYCLINE HYCLATE 100 MG PO TABS
100.0000 mg | ORAL_TABLET | Freq: Two times a day (BID) | ORAL | 0 refills | Status: AC
Start: 2023-05-26 — End: 2023-06-02

## 2023-05-26 NOTE — Patient Instructions (Addendum)
Cough syrup (Delsym), Tylenol, Mucinex, Vicks, and a humidifier at night.

## 2023-05-26 NOTE — Progress Notes (Signed)
Assessment & Plan:  1. Acute non-recurrent maxillary sinusitis (Primary) Education provided on sinus infections.  Encouraged symptom management including cough syrup (Delsym), Tylenol, Mucinex, Vicks, and a humidifier at night.  - doxycycline (VIBRA-TABS) 100 MG tablet; Take 1 tablet (100 mg total) by mouth 2 (two) times daily for 7 days.  Dispense: 14 tablet; Refill: 0  No results found for any visits on 05/26/23.  Follow up plan: Return if symptoms worsen or fail to improve.  Deliah Boston, MSN, APRN, FNP-C  Subjective:  HPI: Monica [name] is a 78 y.o. female presenting on 05/26/2023 for Sinusitis (Sinus pressure, HA, light-headed, no fever, some cough/This started about 2 weeks ago. Seems to be getting worse. /Went to urgent care Friday- got ear gtts and a steriod. //Also has a ENT appt tomorrow for ear pain and tube in right ear./)  Patient complains of cough, head congestion, headache, ear pain/pressure, facial pain/pressure, postnasal drainage, nausea, and light-headed . She denies fever, shortness of breath, and wheezing. Onset of symptoms was 2 weeks ago, gradually worsening since that time. She is drinking plenty of fluids. Evaluation to date: previously seen at urgent care three days ago and diagnosed with sinusitis and otitis externa. She was prescribed a Medrol Dosepak and ear drops, but has not started either of those. She is afraid of the treatment due to previous reactions. Treatment to date:  Flonase, nasal washes .  She does not smoke. She does have an appointment with ENT tomorrow due to a tube in her ear.   ROS: Negative unless specifically indicated above in HPI.   Relevant past medical history reviewed and updated as indicated.   Allergies and medications reviewed and updated.   Current Outpatient Medications:    acetaminophen (TYLENOL) 500 MG tablet, Take 500 mg by mouth every 6 (six) hours as needed for moderate pain or headache., Disp: , Rfl:     Calcium-Magnesium-Zinc (CAL-MAG-ZINC PO), Take 1 tablet by mouth daily., Disp: , Rfl:    certolizumab pegol (CIMZIA) 2 X 200 MG KIT, Per Rheumatology (Patient taking differently: Inject 400 mg into the skin every 30 (thirty) days. Per Rheumatology), Disp: 1 kit, Rfl: 1   Cholecalciferol (VITAMIN D) 1000 UNITS capsule, Take 1,000 Units by mouth daily with lunch., Disp: , Rfl:    dextromethorphan (DELSYM) 30 MG/5ML liquid, Take 30 mg by mouth at bedtime as needed for cough. , Disp: , Rfl:    ELIQUIS 5 MG TABS tablet, TAKE 1 TABLET TWICE A DAY, Disp: 180 tablet, Rfl: 3   Estradiol 10 MCG TABS, Place 10 mcg vaginally 3 (three) times a week., Disp: , Rfl:    fluticasone (FLONASE ALLERGY RELIEF) 50 MCG/ACT nasal spray, Place 1 spray into both nostrils as needed., Disp: , Rfl:    folic acid (FOLVITE) 1 MG tablet, Take 2 mg by mouth daily with lunch., Disp: , Rfl:    guaiFENesin (MUCINEX) 600 MG 12 hr tablet, Take 600 mg by mouth 2 (two) times daily as needed (congestion/cough)., Disp: , Rfl:    Lactobacillus-Inulin (CULTURELLE DIGESTIVE HEALTH PO), Take 1 capsule by mouth in the morning., Disp: , Rfl:    loratadine (CLARITIN) 10 MG tablet, Take 10 mg by mouth in the morning., Disp: , Rfl:    methotrexate (RHEUMATREX) 2.5 MG tablet, Take 10 mg by mouth every Sunday. 6 tablets, Disp: , Rfl:    metoprolol succinate (TOPROL XL) 25 MG 24 hr tablet, Take 1 tablet (25 mg total) by mouth in the morning  and at bedtime., Disp: 180 tablet, Rfl: 3   metoprolol tartrate (LOPRESSOR) 25 MG tablet, Take 0.5 tablets (12.5 mg total) by mouth daily as needed., Disp: 90 tablet, Rfl: 3   Multiple Vitamins-Minerals (ONE A DAY WOMEN 50 PLUS) CHEW, Chew 1 tablet by mouth daily., Disp: , Rfl:    Polyethyl Glycol-Propyl Glycol (SYSTANE ULTRA) 0.4-0.3 % SOLN, Place 1 drop into both eyes 3 (three) times daily as needed., Disp: , Rfl:    Respiratory Therapy Supplies (FLUTTER) DEVI, Twice a day and prn as needed, may increase if feeling  worse, Disp: 1 each, Rfl: 0   tobramycin-dexamethasone (TOBRADEX) ophthalmic solution, Instill 4 drops twice daily into right ear., Disp: , Rfl:    Zoledronic Acid (RECLAST IV), Inject into the vein. IV yearly, Disp: , Rfl:    Hydrocortisone (CORTIZONE-10 EX), Apply 1 Application topically 3 (three) times daily as needed (itching/bug bites). (Patient not taking: Reported on 05/26/2023), Disp: , Rfl:   Allergies  Allergen Reactions   Amoxicillin Diarrhea   Azithromycin Diarrhea   Colchicine Diarrhea   Doxycycline     rash   Moxifloxacin Nausea Only   Nitrofurantoin Diarrhea and Nausea Only   Albuterol Other (See Comments)    Patient states put her into atrial fib last time she took this medication   Amoxicillin-Pot Clavulanate Diarrhea    Has patient had a PCN reaction causing immediate rash, facial/tongue/throat swelling, SOB or lightheadedness with hypotension:No Has patient had a PCN reaction causing severe rash involving mucus membranes or skin necrosis:No Has patient had a PCN reaction that required hospitalization:No Has patient had a PCN reaction occurring within the last 10 years:Yes If all of the above answers are "NO", then may proceed with Cephalosporin use   Avelox [Moxifloxacin Hcl In Nacl] Nausea Only   Benzonatate Other (See Comments)    felt bad, out of sorts, disoriented.   Ciprofloxacin Rash and Other (See Comments)    Rash across abdomen   Macrobid [Nitrofurantoin Macrocrystal] Diarrhea    Diarrhea, nausea   Septra [Sulfamethoxazole-Trimethoprim] Other (See Comments)    flushing    Objective:   BP 118/64   Pulse 72   Temp 97.7 F (36.5 C)   Resp 20   Ht 5' 1.5" (1.562 m)   Wt 100 lb (45.4 kg)   SpO2 97%   BMI 18.59 kg/m    Physical Exam Vitals reviewed.  Constitutional:      General: She is not in acute distress.    Appearance: Normal appearance. She is not ill-appearing, toxic-appearing or diaphoretic.  HENT:     Head: Normocephalic and  atraumatic.     Right Ear: Tympanic membrane, ear canal and external ear normal. There is no impacted cerumen.     Left Ear: Tympanic membrane, ear canal and external ear normal. There is no impacted cerumen.     Nose: Congestion present. No rhinorrhea.     Right Sinus: Maxillary sinus tenderness present. No frontal sinus tenderness.     Left Sinus: Maxillary sinus tenderness present. No frontal sinus tenderness.     Mouth/Throat:     Mouth: Mucous membranes are moist.     Pharynx: Oropharynx is clear. No oropharyngeal exudate or posterior oropharyngeal erythema.     Comments: Cobblestone throat. Eyes:     General: No scleral icterus.       Right eye: No discharge.        Left eye: No discharge.     Conjunctiva/sclera: Conjunctivae normal.  Cardiovascular:  Rate and Rhythm: Normal rate and regular rhythm.     Heart sounds: Normal heart sounds. No murmur heard.    No friction rub. No gallop.  Pulmonary:     Effort: Pulmonary effort is normal. No respiratory distress.     Breath sounds: Normal breath sounds. No stridor. No wheezing, rhonchi or rales.  Musculoskeletal:        General: Normal range of motion.     Cervical back: Normal range of motion.  Lymphadenopathy:     Cervical: No cervical adenopathy.  Skin:    General: Skin is warm and dry.     Capillary Refill: Capillary refill takes less than 2 seconds.  Neurological:     General: No focal deficit present.     Mental Status: She is alert and oriented to person, place, and time. Mental status is at baseline.  Psychiatric:        Mood and Affect: Mood normal.        Behavior: Behavior normal.        Thought Content: Thought content normal.        Judgment: Judgment normal.

## 2023-05-27 DIAGNOSIS — Z9622 Myringotomy tube(s) status: Secondary | ICD-10-CM | POA: Diagnosis not present

## 2023-05-27 DIAGNOSIS — R0982 Postnasal drip: Secondary | ICD-10-CM | POA: Diagnosis not present

## 2023-05-27 DIAGNOSIS — H903 Sensorineural hearing loss, bilateral: Secondary | ICD-10-CM | POA: Diagnosis not present

## 2023-05-28 DIAGNOSIS — R0982 Postnasal drip: Secondary | ICD-10-CM | POA: Insufficient documentation

## 2023-06-04 ENCOUNTER — Encounter: Payer: Self-pay | Admitting: Cardiovascular Disease

## 2023-06-04 MED ORDER — METOPROLOL TARTRATE 25 MG PO TABS: 12.5000 mg | ORAL_TABLET | Freq: Every day | ORAL | 1 refills | Status: DC | PRN

## 2023-06-04 NOTE — Telephone Encounter (Signed)
OK to refill it "as needed", as she requested, please

## 2023-06-06 ENCOUNTER — Other Ambulatory Visit: Payer: Self-pay

## 2023-06-06 MED ORDER — METOPROLOL TARTRATE 25 MG PO TABS
25.0000 mg | ORAL_TABLET | Freq: Two times a day (BID) | ORAL | 3 refills | Status: DC
Start: 2023-06-06 — End: 2023-07-22

## 2023-06-06 MED ORDER — METOPROLOL TARTRATE 25 MG PO TABS: 12.5000 mg | ORAL_TABLET | Freq: Every day | ORAL | Status: AC | PRN

## 2023-06-10 DIAGNOSIS — M0579 Rheumatoid arthritis with rheumatoid factor of multiple sites without organ or systems involvement: Secondary | ICD-10-CM | POA: Diagnosis not present

## 2023-07-08 DIAGNOSIS — M0579 Rheumatoid arthritis with rheumatoid factor of multiple sites without organ or systems involvement: Secondary | ICD-10-CM | POA: Diagnosis not present

## 2023-07-20 DIAGNOSIS — J302 Other seasonal allergic rhinitis: Secondary | ICD-10-CM | POA: Diagnosis not present

## 2023-07-22 ENCOUNTER — Ambulatory Visit (INDEPENDENT_AMBULATORY_CARE_PROVIDER_SITE_OTHER): Payer: Medicare Other | Admitting: Internal Medicine

## 2023-07-22 ENCOUNTER — Encounter: Payer: Self-pay | Admitting: Internal Medicine

## 2023-07-22 VITALS — BP 110/70 | HR 105 | Temp 97.7°F | Ht 61.5 in | Wt 103.0 lb

## 2023-07-22 DIAGNOSIS — J301 Allergic rhinitis due to pollen: Secondary | ICD-10-CM | POA: Diagnosis not present

## 2023-07-22 DIAGNOSIS — M059 Rheumatoid arthritis with rheumatoid factor, unspecified: Secondary | ICD-10-CM | POA: Diagnosis not present

## 2023-07-22 DIAGNOSIS — J321 Chronic frontal sinusitis: Secondary | ICD-10-CM | POA: Diagnosis not present

## 2023-07-22 DIAGNOSIS — E785 Hyperlipidemia, unspecified: Secondary | ICD-10-CM | POA: Diagnosis not present

## 2023-07-22 DIAGNOSIS — I480 Paroxysmal atrial fibrillation: Secondary | ICD-10-CM

## 2023-07-22 DIAGNOSIS — Z411 Encounter for cosmetic surgery: Secondary | ICD-10-CM | POA: Insufficient documentation

## 2023-07-22 DIAGNOSIS — R0982 Postnasal drip: Secondary | ICD-10-CM

## 2023-07-22 DIAGNOSIS — L570 Actinic keratosis: Secondary | ICD-10-CM | POA: Insufficient documentation

## 2023-07-22 DIAGNOSIS — Z86018 Personal history of other benign neoplasm: Secondary | ICD-10-CM | POA: Insufficient documentation

## 2023-07-22 DIAGNOSIS — L720 Epidermal cyst: Secondary | ICD-10-CM | POA: Insufficient documentation

## 2023-07-22 DIAGNOSIS — D2262 Melanocytic nevi of left upper limb, including shoulder: Secondary | ICD-10-CM | POA: Insufficient documentation

## 2023-07-22 DIAGNOSIS — L7211 Pilar cyst: Secondary | ICD-10-CM | POA: Insufficient documentation

## 2023-07-22 MED ORDER — DOXYCYCLINE HYCLATE 100 MG PO TABS: 100.0000 mg | ORAL_TABLET | Freq: Two times a day (BID) | ORAL | 0 refills | Status: DC

## 2023-07-22 NOTE — Assessment & Plan Note (Signed)
 Try Xyzal or Clarinex Cont Flonase, Astelin Finish prednisone Doxy if worse

## 2023-07-22 NOTE — Assessment & Plan Note (Signed)
 Try Xyzal or Clarinex Cont Flonase, Astelin Finish prednisone Doxy if worse Sinus CT offered - pt declined

## 2023-07-22 NOTE — Progress Notes (Signed)
 Subjective:  Patient ID: Monica [name], female    DOB: July 14, 1945  Age: 78 y.o. MRN: [id number]  CC: Medical Management of Chronic Issues (6 mnth f/u)   HPI Monica [name] presents for sinusitis - finishing Prednisone, RA On MTx and Cimzia monthly, A fib - s/p ablation Low grade fever resolved  Outpatient Medications Prior to Visit  Medication Sig Dispense Refill   acetaminophen (TYLENOL) 500 MG tablet Take 500 mg by mouth every 6 (six) hours as needed for moderate pain or headache.     Calcium-Magnesium-Zinc (CAL-MAG-ZINC PO) Take 1 tablet by mouth daily.     certolizumab pegol (CIMZIA) 2 X 200 MG KIT Per Rheumatology (Patient taking differently: Inject 400 mg into the skin every 30 (thirty) days. Per Rheumatology) 1 kit 1   Cholecalciferol (VITAMIN D) 1000 UNITS capsule Take 1,000 Units by mouth daily with lunch.     dextromethorphan (DELSYM) 30 MG/5ML liquid Take 30 mg by mouth at bedtime as needed for cough.      ELIQUIS 5 MG TABS tablet TAKE 1 TABLET TWICE A DAY 180 tablet 3   Estradiol 10 MCG TABS Place 10 mcg vaginally 3 (three) times a week.     fluticasone (FLONASE ALLERGY RELIEF) 50 MCG/ACT nasal spray Place 1 spray into both nostrils as needed.     folic acid (FOLVITE) 1 MG tablet Take 2 mg by mouth daily with lunch.     guaiFENesin (MUCINEX) 600 MG 12 hr tablet Take 600 mg by mouth 2 (two) times daily as needed (congestion/cough).     Lactobacillus-Inulin (CULTURELLE DIGESTIVE HEALTH PO) Take 1 capsule by mouth in the morning.     loratadine (CLARITIN) 10 MG tablet Take 10 mg by mouth in the morning.     methotrexate (RHEUMATREX) 2.5 MG tablet Take 10 mg by mouth every Sunday. 6 tablets     metoprolol succinate (TOPROL XL) 25 MG 24 hr tablet Take 1 tablet (25 mg total) by mouth in the morning and at bedtime. 180 tablet 3   metoprolol tartrate (LOPRESSOR) 25 MG tablet Take 0.5 tablets (12.5 mg total) by mouth daily as needed.     Multiple Vitamins-Minerals (ONE A DAY WOMEN  50 PLUS) CHEW Chew 1 tablet by mouth daily.     ondansetron (ZOFRAN-ODT) 4 MG disintegrating tablet Take by mouth.     Polyethyl Glycol-Propyl Glycol (SYSTANE ULTRA) 0.4-0.3 % SOLN Place 1 drop into both eyes 3 (three) times daily as needed.     predniSONE (DELTASONE) 10 MG tablet Take 6 pills day 1, 5 pills day 2, 4 pills day 3, 3 pills day for, 2 pills day 5, 1 pills day 6.  Take all pills first thing in a.m. with food     Respiratory Therapy Supplies (FLUTTER) DEVI Twice a day and prn as needed, may increase if feeling worse 1 each 0   tobramycin-dexamethasone (TOBRADEX) ophthalmic solution Instill 4 drops twice daily into right ear.     triamcinolone cream (KENALOG) 0.1 % 1 application to affected area Externally Twice a day for 14 days     Zoledronic Acid (RECLAST IV) Inject into the vein. IV yearly     Hydrocortisone (CORTIZONE-10 EX) Apply 1 Application topically 3 (three) times daily as needed (itching/bug bites). (Patient not taking: Reported on 05/26/2023)     metoprolol tartrate (LOPRESSOR) 25 MG tablet Take 1 tablet (25 mg total) by mouth 2 (two) times daily. 180 tablet 3   No facility-administered medications prior to visit.  ROS: Review of Systems  Constitutional:  Negative for activity change, appetite change, chills, fatigue and unexpected weight change.  HENT:  Positive for congestion, postnasal drip and rhinorrhea. Negative for mouth sores and sinus pressure.   Eyes:  Negative for visual disturbance.  Respiratory:  Positive for cough. Negative for chest tightness.   Gastrointestinal:  Negative for abdominal pain and nausea.  Genitourinary:  Negative for difficulty urinating, frequency and vaginal pain.  Musculoskeletal:  Negative for back pain and gait problem.  Skin:  Negative for pallor and rash.  Neurological:  Negative for dizziness, tremors, weakness, numbness and headaches.  Psychiatric/Behavioral:  Negative for confusion, sleep disturbance and suicidal ideas.      Objective:  BP 110/70   Pulse (!) 105   Temp 97.7 F (36.5 C) (Oral)   Ht 5' 1.5" (1.562 m)   Wt 103 lb (46.7 kg)   SpO2 94%   BMI 19.15 kg/m   BP Readings from Last 3 Encounters:  07/22/23 110/70  05/26/23 118/64  02/27/23 104/67    Wt Readings from Last 3 Encounters:  07/22/23 103 lb (46.7 kg)  05/26/23 100 lb (45.4 kg)  02/27/23 102 lb 9.6 oz (46.5 kg)    Physical Exam Constitutional:      General: She is not in acute distress.    Appearance: Normal appearance. She is well-developed.  HENT:     Head: Normocephalic.     Right Ear: External ear normal.     Left Ear: External ear normal.     Nose: Nose normal.  Eyes:     General:        Right eye: No discharge.        Left eye: No discharge.     Conjunctiva/sclera: Conjunctivae normal.     Pupils: Pupils are equal, round, and reactive to light.  Neck:     Thyroid: No thyromegaly.     Vascular: No JVD.     Trachea: No tracheal deviation.  Cardiovascular:     Rate and Rhythm: Normal rate and regular rhythm.     Heart sounds: Normal heart sounds.  Pulmonary:     Effort: No respiratory distress.     Breath sounds: No stridor. No wheezing.  Abdominal:     General: Bowel sounds are normal. There is no distension.     Palpations: Abdomen is soft. There is no mass.     Tenderness: There is no abdominal tenderness. There is no guarding or rebound.  Musculoskeletal:        General: No tenderness.     Cervical back: Normal range of motion and neck supple. No rigidity.  Lymphadenopathy:     Cervical: No cervical adenopathy.  Skin:    Findings: No erythema or rash.  Neurological:     Cranial Nerves: No cranial nerve deficit.     Motor: No abnormal muscle tone.     Coordination: Coordination normal.     Deep Tendon Reflexes: Reflexes normal.  Psychiatric:        Behavior: Behavior normal.        Thought Content: Thought content normal.        Judgment: Judgment normal.     Lab Results  Component Value  Date   WBC 12.9 (H) 01/26/2023   HGB 13.3 01/26/2023   HCT 40.6 01/26/2023   PLT 357 01/26/2023   GLUCOSE 93 01/26/2023   CHOL 140 07/06/2018   TRIG 41.0 07/06/2018   HDL 62.80 07/06/2018   LDLCALC 69 07/06/2018  ALT 11 09/04/2022   AST 16 09/04/2022   NA 132 (L) 01/26/2023   K 4.4 01/26/2023   CL 99 01/26/2023   CREATININE 0.66 01/26/2023   BUN 26 (H) 01/26/2023   CO2 26 01/26/2023   TSH 3.09 05/30/2021    CT Chest Wo Contrast Result Date: 03/17/2023 CLINICAL DATA:  Diffuse/interstitial lung disease, bronchiectasis, COPD. EXAM: CT CHEST WITHOUT CONTRAST TECHNIQUE: Multidetector CT imaging of the chest was performed following the standard protocol without IV contrast. RADIATION DOSE REDUCTION: This exam was performed according to the departmental dose-optimization program which includes automated exposure control, adjustment of the mA and/or kV according to patient size and/or use of iterative reconstruction technique. COMPARISON:  Cardiac CT 10/03/2022, CT chest 02/12/2022. FINDINGS: Cardiovascular: Atherosclerotic calcification of the aorta. Heart size normal. No pericardial effusion. Mediastinum/Nodes: 8 mm low-attenuation lesion in the right thyroid. No follow-up recommended. (Ref: J Am Coll Radiol. 2015 Feb;12(2): 143-50).No pathologically enlarged mediastinal or axillary lymph nodes. Hilar regions are difficult to definitively evaluate without IV contrast. Air in the esophagus can be seen with dysmotility. Lungs/Pleura: Biapical pleuroparenchymal scarring. Cylindrical and mildly varicose bronchiectasis with patchy peribronchovascular nodularity and mucoid impaction, grossly unchanged from 02/12/2022. No pleural fluid. Airway is otherwise unremarkable. Upper Abdomen: Probable tiny cysts in the liver. No specific follow-up necessary. Tiny hiatal hernia. Visualized portions of the liver, gallbladder, adrenal glands, kidneys, spleen, pancreas, stomach and bowel are otherwise grossly  unremarkable. No upper abdominal adenopathy. Musculoskeletal: Degenerative changes in the spine. Pectus deformity. IMPRESSION: 1. Bronchiectasis with patchy peribronchovascular nodularity and mucoid impaction, unchanged from 02/12/2022. 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 2.  Aortic atherosclerosis (ICD10-I70.0). Electronically Signed   By: Leanna Battles M.D.   On: 03/17/2023 08:23    Assessment & Plan:   Problem List Items Addressed This Visit     Sinusitis, chronic   Try Xyzal or Clarinex Cont Flonase, Astelin Finish prednisone Doxy if worse Sinus CT offered - pt declined      Relevant Medications   predniSONE (DELTASONE) 10 MG tablet   doxycycline (VIBRA-TABS) 100 MG tablet   Seasonal allergic rhinitis   Try Xyzal or Clarinex Cont Flonase, Astelin Finish prednisone Sinus CT offered - pt declined      Relevant Orders   Comprehensive metabolic panel   T4, free   TSH   Lipid panel   Paroxysmal A-fib (HCC)   In NSR now      Relevant Orders   Comprehensive metabolic panel   T4, free   TSH   Lipid panel   Rheumatoid arthritis (HCC)   On MTx and Cimzia monthly      Relevant Medications   predniSONE (DELTASONE) 10 MG tablet   Other Relevant Orders   Comprehensive metabolic panel   T4, free   TSH   Lipid panel   Postnasal drip - Primary   Try Xyzal or Clarinex Cont Flonase, Astelin Finish prednisone Doxy if worse       Other Visit Diagnoses       Dyslipidemia       Relevant Orders   T4, free   TSH   Lipid panel         Meds ordered this encounter  Medications   doxycycline (VIBRA-TABS) 100 MG tablet    Sig: Take 1 tablet (100 mg total) by mouth 2 (two) times daily.    Dispense:  20 tablet    Refill:  0    Per pt - she can take Doxy  Follow-up: Return in about 3 months (around 10/22/2023) for a follow-up visit.  Sonda Primes, MD

## 2023-07-22 NOTE — Assessment & Plan Note (Signed)
 Try Xyzal or Clarinex Cont Flonase, Astelin Finish prednisone Sinus CT offered - pt declined

## 2023-07-22 NOTE — Assessment & Plan Note (Signed)
In NSR now 

## 2023-07-22 NOTE — Assessment & Plan Note (Signed)
 On MTx and Cimzia monthly

## 2023-07-22 NOTE — Patient Instructions (Signed)
 Try Xyzal or Clarinex

## 2023-07-25 DIAGNOSIS — L578 Other skin changes due to chronic exposure to nonionizing radiation: Secondary | ICD-10-CM | POA: Diagnosis not present

## 2023-07-25 DIAGNOSIS — L7211 Pilar cyst: Secondary | ICD-10-CM | POA: Diagnosis not present

## 2023-07-25 DIAGNOSIS — L570 Actinic keratosis: Secondary | ICD-10-CM | POA: Diagnosis not present

## 2023-07-25 DIAGNOSIS — Z86018 Personal history of other benign neoplasm: Secondary | ICD-10-CM | POA: Diagnosis not present

## 2023-07-25 DIAGNOSIS — D2262 Melanocytic nevi of left upper limb, including shoulder: Secondary | ICD-10-CM | POA: Diagnosis not present

## 2023-07-25 DIAGNOSIS — L821 Other seborrheic keratosis: Secondary | ICD-10-CM | POA: Diagnosis not present

## 2023-08-05 DIAGNOSIS — Z79899 Other long term (current) drug therapy: Secondary | ICD-10-CM | POA: Diagnosis not present

## 2023-08-05 DIAGNOSIS — M0579 Rheumatoid arthritis with rheumatoid factor of multiple sites without organ or systems involvement: Secondary | ICD-10-CM | POA: Diagnosis not present

## 2023-08-11 DIAGNOSIS — Z1231 Encounter for screening mammogram for malignant neoplasm of breast: Secondary | ICD-10-CM | POA: Diagnosis not present

## 2023-08-11 LAB — HM MAMMOGRAPHY

## 2023-08-12 ENCOUNTER — Encounter: Payer: Self-pay | Admitting: Internal Medicine

## 2023-08-12 ENCOUNTER — Other Ambulatory Visit (INDEPENDENT_AMBULATORY_CARE_PROVIDER_SITE_OTHER)

## 2023-08-12 DIAGNOSIS — I480 Paroxysmal atrial fibrillation: Secondary | ICD-10-CM | POA: Diagnosis not present

## 2023-08-12 DIAGNOSIS — M059 Rheumatoid arthritis with rheumatoid factor, unspecified: Secondary | ICD-10-CM | POA: Diagnosis not present

## 2023-08-12 DIAGNOSIS — E785 Hyperlipidemia, unspecified: Secondary | ICD-10-CM | POA: Diagnosis not present

## 2023-08-12 DIAGNOSIS — J301 Allergic rhinitis due to pollen: Secondary | ICD-10-CM | POA: Diagnosis not present

## 2023-08-12 LAB — LIPID PANEL
Cholesterol: 141 mg/dL (ref 0–200)
HDL: 51.8 mg/dL (ref >39.00)
LDL Cholesterol: 71 mg/dL (ref 0–99)
NonHDL: 88.98
Total CHOL/HDL Ratio: 3
Triglycerides: 88 mg/dL (ref 0.0–149.0)
VLDL: 17.6 mg/dL (ref 0.0–40.0)

## 2023-08-12 LAB — COMPREHENSIVE METABOLIC PANEL WITH GFR
ALT: 16 U/L (ref 0–35)
AST: 18 U/L (ref 0–37)
Albumin: 4 g/dL (ref 3.5–5.2)
Alkaline Phosphatase: 61 U/L (ref 39–117)
BUN: 27 mg/dL — ABNORMAL HIGH (ref 6–23)
CO2: 27 meq/L (ref 19–32)
Calcium: 9.1 mg/dL (ref 8.4–10.5)
Chloride: 100 meq/L (ref 96–112)
Creatinine, Ser: 0.72 mg/dL (ref 0.40–1.20)
GFR: 80.09 mL/min (ref >60.00)
Glucose, Bld: 130 mg/dL — ABNORMAL HIGH (ref 70–99)
Potassium: 4.1 meq/L (ref 3.5–5.1)
Sodium: 136 meq/L (ref 135–145)
Total Bilirubin: 0.5 mg/dL (ref 0.2–1.2)
Total Protein: 7.1 g/dL (ref 6.0–8.3)

## 2023-08-12 LAB — TSH: TSH: 3.36 u[IU]/mL (ref 0.35–5.50)

## 2023-08-12 LAB — T4, FREE: Free T4: 0.8 ng/dL (ref 0.60–1.60)

## 2023-08-14 ENCOUNTER — Encounter: Payer: Self-pay | Admitting: Family

## 2023-08-25 DIAGNOSIS — Z9622 Myringotomy tube(s) status: Secondary | ICD-10-CM | POA: Diagnosis not present

## 2023-08-25 DIAGNOSIS — H903 Sensorineural hearing loss, bilateral: Secondary | ICD-10-CM | POA: Diagnosis not present

## 2023-08-25 DIAGNOSIS — H6121 Impacted cerumen, right ear: Secondary | ICD-10-CM | POA: Diagnosis not present

## 2023-08-26 ENCOUNTER — Ambulatory Visit (INDEPENDENT_AMBULATORY_CARE_PROVIDER_SITE_OTHER): Admitting: Family Medicine

## 2023-08-26 ENCOUNTER — Ambulatory Visit: Payer: Self-pay

## 2023-08-26 ENCOUNTER — Encounter: Payer: Self-pay | Admitting: Family Medicine

## 2023-08-26 VITALS — BP 114/70 | HR 106 | Temp 98.8°F | Ht 61.5 in | Wt 103.2 lb

## 2023-08-26 DIAGNOSIS — R1030 Lower abdominal pain, unspecified: Secondary | ICD-10-CM | POA: Diagnosis not present

## 2023-08-26 DIAGNOSIS — J301 Allergic rhinitis due to pollen: Secondary | ICD-10-CM

## 2023-08-26 DIAGNOSIS — G4489 Other headache syndrome: Secondary | ICD-10-CM | POA: Diagnosis not present

## 2023-08-26 DIAGNOSIS — R8290 Unspecified abnormal findings in urine: Secondary | ICD-10-CM

## 2023-08-26 LAB — POCT URINALYSIS DIPSTICK
Bilirubin, UA: NEGATIVE
Glucose, UA: NEGATIVE
Ketones, UA: NEGATIVE
Leukocytes, UA: NEGATIVE
Nitrite, UA: NEGATIVE
Protein, UA: NEGATIVE
Spec Grav, UA: 1.01 (ref 1.010–1.025)
Urobilinogen, UA: 0.2 U/dL
pH, UA: 6 (ref 5.0–8.0)

## 2023-08-26 NOTE — Progress Notes (Signed)
 Acute Office Visit  Subjective:     Patient ID: Monica [name], female    DOB: May 28, 1945, 78 y.o.   MRN: [id number]  Chief Complaint  Patient presents with   Acute Visit    Abdominal pains, starts in the pubic area and moves up into her stomach. Urine was cloudy this morning, had some mucus shreds in urine . Started on Sunday    HPI Patient is in today for evaluation of lower abdominal pain for the last 2 days. Reports that her urinary frequency, urine has also been very cloudy.  Has noticed mucus in her urine. Took home UTI test from [facility] and was positive for leukocytes. Denies dysuria, gross hematuria, urinary urgency, fever, chills, diarrhea, nausea, vomiting, rash, other symptoms.  Reports frontal sinus headache.  Has been taking Claritin . Had sinusitis that required antibiotic at the end of March.  She is very sensitive to antibiotics, states feeling which she can really tolerate right now with doxycycline . Would rather treat without antibiotics if we can. Has been using sinus rinse, Flonase  daily. She does see ENT, has TM tube in the right ear.  Not currently draining any fluid.    ROS Per HPI      Objective:    BP 114/70 (BP Location: Left Arm, Patient Position: Sitting)   Pulse (!) 106   Temp 98.8 F (37.1 C) (Temporal)   Ht 5' 1.5" (1.562 m)   Wt 103 lb 3.2 oz (46.8 kg)   SpO2 98%   BMI 19.18 kg/m    Physical Exam Vitals and nursing note reviewed.  Constitutional:      General: She is not in acute distress.    Appearance: Normal appearance. She is normal weight.     Comments: Appears fatigued  HENT:     Head: Normocephalic and atraumatic.     Right Ear: External ear normal.     Left Ear: Tympanic membrane, ear canal and external ear normal.     Ears:     Comments: Right TM with cerumen impeding visualization of tympanostomy tube, no discharge no bleeding    Nose: Rhinorrhea present.     Mouth/Throat:     Mouth: Mucous membranes are moist.      Pharynx: Oropharynx is clear.     Comments: Oropharyngeal cobblestoning   Eyes:     Extraocular Movements: Extraocular movements intact.     Pupils: Pupils are equal, round, and reactive to light.  Cardiovascular:     Rate and Rhythm: Normal rate and regular rhythm.     Pulses: Normal pulses.     Heart sounds: Normal heart sounds.  Pulmonary:     Effort: Pulmonary effort is normal. No respiratory distress.     Breath sounds: Normal breath sounds. No wheezing, rhonchi or rales.  Musculoskeletal:        General: Normal range of motion.     Cervical back: Normal range of motion.     Right lower leg: No edema.     Left lower leg: No edema.  Lymphadenopathy:     Cervical: No cervical adenopathy.  Neurological:     General: No focal deficit present.     Mental Status: She is alert and oriented to person, place, and time.  Psychiatric:        Mood and Affect: Mood normal.        Thought Content: Thought content normal.    Results for orders placed or performed in visit on 08/26/23  POCT Urinalysis  Dipstick  Result Value Ref Range   Color, UA Yellow    Clarity, UA Clear    Glucose, UA Negative Negative   Bilirubin, UA Negative    Ketones, UA Negative    Spec Grav, UA 1.010 1.010 - 1.025   Blood, UA Trace    pH, UA 6.0 5.0 - 8.0   Protein, UA Negative Negative   Urobilinogen, UA 0.2 0.2 or 1.0 E.U./dL   Nitrite, UA Negative    Leukocytes, UA Negative Negative   Appearance     Odor          Assessment & Plan:   Lower abdominal pain -     POCT urinalysis dipstick -     Urine Culture  Cloudy urine -     POCT urinalysis dipstick -     Urine Culture  Seasonal allergic rhinitis due to pollen Assessment & Plan:  Recommend changing antihistamine to Allegra or Zyrtec from Claritin .  They are usually more effective.  Sometimes they can make you sleepy, so would recommend trying the new antihistamine in the evening.  I would continue with the Flonase , and continue  with sinus rinses.   Other headache syndrome  Likely sinus headache, sinuses non tender on exam  We have sent your urine for a culture today.  We will be in contact with results that require further attention.  May use coconut oil to urethra and vagina to help with irritation.  May also take a baking soda bath, about a cup of baking soda in the tub to help with vaginal urethral irritation.  No orders of the defined types were placed in this encounter.  I spent 25 minutes on this patient encounter including counseling, diagnosis, treatment options, documentation, face-to-face time.   Return if symptoms worsen or fail to improve.  Wellington Half, FNP

## 2023-08-26 NOTE — Patient Instructions (Signed)
 We have sent your urine for a culture today.  We will be in contact with results that require further attention.  May use coconut oil to urethra and vagina to help with irritation.  May also take a baking soda bath, about a cup of baking soda in the tub to help with vaginal urethral irritation.  I would recommend changing antihistamine to Allegra or Zyrtec from Claritin .  They are usually more effective.  Sometimes they can make you sleepy, so would recommend trying the new antihistamine in the evening.  I would continue with the Flonase , and continue with sinus rinses.  Follow-up with me for new or worsening symptoms.

## 2023-08-26 NOTE — Assessment & Plan Note (Signed)
  Recommend changing antihistamine to Allegra or Zyrtec from Claritin .  They are usually more effective.  Sometimes they can make you sleepy, so would recommend trying the new antihistamine in the evening.  I would continue with the Flonase , and continue with sinus rinses.

## 2023-08-26 NOTE — Telephone Encounter (Signed)
 Copied from CRM [id number]. Topic: Clinical - Red Word Triage >> Aug 26, 2023 12:59 PM [name] wrote: [name] that prompted transfer to Nurse Triage: Abdominal discomfort. Patient stating she has mild pain/discomfort in her abdomen and thinks it could be a bladder infection. Patient noticed discharge in her urine and it has been cloudy for the past couple days.   Chief Complaint: Abdominal pain  Symptoms: Lower abdominal pain, cloudy urine, headache, fatigue  Frequency: Constant  Disposition: [] ED /[] Urgent Care (no appt availability in office) / [x] Appointment(In office/virtual)/ []  [place] [facility]/ [] Home Care/ [] Refused Recommended Disposition /[] [facility]/ []  Follow-up with PCP Additional Notes: Patient reports lower abdominal pain that began 2 days ago. She states that her pain is constant and is rates it as 2-3/10. She states that with her pain she has been experiencing headache, fatigue, and cloudy urine. Patient would like to come in to be evaluated today if possible. Appointment made for the patient later today for evaluation of symptoms.     Reason for Disposition  [1] MILD-MODERATE pain AND [2] constant AND [3] present > 2 hours  Answer Assessment - Initial Assessment Questions 1. LOCATION: "Where does it hurt?"      Lower abdomen  2. RADIATION: "Does the pain shoot anywhere else?" (e.g., chest, back)     No 3. ONSET: "When did the pain begin?" (e.g., minutes, hours or days ago)      2 days ago  4. SUDDEN: "Gradual or sudden onset?"     Gradual  5. PATTERN "Does the pain come and go, or is it constant?"    - If it comes and goes: "How long does it last?" "Do you have pain now?"     (Note: Comes and goes means the pain is intermittent. It goes away completely between bouts.)    - If constant: "Is it getting better, staying the same, or getting worse?"      (Note: Constant means the pain never goes away completely; most serious pain is constant and gets  worse.)      Constant  6. SEVERITY: "How bad is the pain?"  (e.g., Scale 1-10; mild, moderate, or severe)    - MILD (1-3): Doesn't interfere with normal activities, abdomen soft and not tender to touch.     - MODERATE (4-7): Interferes with normal activities or awakens from sleep, abdomen tender to touch.     - SEVERE (8-10): Excruciating pain, doubled over, unable to do any normal activities.       2-3/10 7. RECURRENT SYMPTOM: "Have you ever had this type of stomach pain before?" If Yes, ask: "When was the last time?" and "What happened that time?"      Yes, but not for years  8. CAUSE: "What do you think is causing the stomach pain?"     Unsure, believes it may be due to a bladder infection  9. RELIEVING/AGGRAVATING FACTORS: "What makes it better or worse?" (e.g., antacids, bending or twisting motion, bowel movement)     No 10. OTHER SYMPTOMS: "Do you have any other symptoms?" (e.g., back pain, diarrhea, fever, urination pain, vomiting)       Headache, cloudy urine, fatigue  Protocols used: Abdominal Pain - Female-A-AH

## 2023-08-27 ENCOUNTER — Ambulatory Visit

## 2023-08-27 VITALS — BP 112/60 | HR 97 | Ht 60.0 in | Wt 103.0 lb

## 2023-08-27 DIAGNOSIS — Z0000 Encounter for general adult medical examination without abnormal findings: Secondary | ICD-10-CM

## 2023-08-27 LAB — URINE CULTURE: Result:: NO GROWTH

## 2023-08-27 NOTE — Patient Instructions (Addendum)
 Ms. Arista , Thank you for taking time to come for your Medicare Wellness Visit. I appreciate your ongoing commitment to your health goals. Please review the following plan we discussed and let me know if I can assist you in the future.   Referrals/Orders/Follow-Ups/Clinician Recommendations: Aim for 30 minutes of exercise or brisk walking, 6-8 glasses of water, and 5 servings of fruits and vegetables each day.   This is a list of the screening recommended for you and due dates:  Health Maintenance  Topic Date Due   DTaP/Tdap/Td vaccine (3 - Td or Tdap) 07/18/2022   COVID-19 Vaccine (8 - Pfizer risk 2024-25 season) 08/15/2023   Flu Shot  12/05/2023   Colon Cancer Screening  08/15/2024   Medicare Annual Wellness Visit  08/26/2024   Pneumonia Vaccine  Completed   DEXA scan (bone density measurement)  Completed   Hepatitis C Screening  Completed   Zoster (Shingles) Vaccine  Completed   HPV Vaccine  Aged Out   Meningitis B Vaccine  Aged Out    Advanced directives: (Copy Requested) Please bring a copy of your health care power of attorney and living will to the office to be added to your chart at your convenience. You can mail to [facility] [address] [place], [place] [address] or email to [email or web address]  Next Medicare Annual Wellness Visit scheduled for next year: Yes

## 2023-08-27 NOTE — Progress Notes (Cosign Needed Addendum)
 Subjective:   Monica [name] is a 78 y.o. who presents for a Medicare Wellness preventive visit.  Visit Complete: In person  Persons Participating in Visit: Patient.  AWV Questionnaire: Yes: Patient Medicare AWV questionnaire was completed by the patient on 08/23/2023; I have confirmed that all information answered by patient is correct and no changes since this date.  Cardiac Risk Factors include: advanced age (>18men, >42 women)     Objective:    Today's Vitals   08/27/23 0932  BP: 112/60  Pulse: 97  SpO2: 97%  Weight: 103 lb (46.7 kg)  Height: 5' (1.524 m)   Body mass index is 20.12 kg/m.     08/27/2023    9:32 AM 01/26/2023   10:32 AM 09/04/2022    3:37 PM 07/30/2022    2:09 PM 09/12/2021    8:08 PM 08/10/2021    9:43 PM 07/17/2021   10:47 AM  Advanced Directives  Does Patient Have a Medical Advance Directive? Yes No No Yes No No Yes  Type of Estate agent of [place];Living will   Healthcare Power of [place];Living will   Living will;Healthcare Power of Attorney  Does patient want to make changes to medical advance directive?       No - Patient declined  Copy of Healthcare Power of Attorney in Chart? No - copy requested   No - copy requested   No - copy requested  Would patient like information on creating a medical advance directive?  No - Patient declined No - Patient declined  No - Patient declined      Current Medications (verified) Outpatient Encounter Medications as of 08/27/2023  Medication Sig   acetaminophen  (TYLENOL ) 500 MG tablet Take 500 mg by mouth every 6 (six) hours as needed for moderate pain or headache.   Calcium -Magnesium -Zinc (CAL-MAG-ZINC PO) Take 1 tablet by mouth daily.   certolizumab pegol  (CIMZIA ) 2 X 200 MG KIT Per Rheumatology (Patient taking differently: Inject 400 mg into the skin every 30 (thirty) days. Per Rheumatology)   Cholecalciferol (VITAMIN D ) 1000 UNITS capsule Take 1,000 Units by mouth daily with lunch.    dextromethorphan  (DELSYM ) 30 MG/5ML liquid Take 30 mg by mouth at bedtime as needed for cough.    doxycycline  (VIBRA -TABS) 100 MG tablet Take 1 tablet (100 mg total) by mouth 2 (two) times daily.   ELIQUIS  5 MG TABS tablet TAKE 1 TABLET TWICE A DAY   Estradiol 10 MCG TABS Place 10 mcg vaginally 3 (three) times a week.   fluticasone  (FLONASE  ALLERGY  RELIEF) 50 MCG/ACT nasal spray Place 1 spray into both nostrils as needed.   folic acid (FOLVITE) 1 MG tablet Take 2 mg by mouth daily with lunch.   guaiFENesin  (MUCINEX ) 600 MG 12 hr tablet Take 600 mg by mouth 2 (two) times daily as needed (congestion/cough).   Lactobacillus-Inulin (CULTURELLE DIGESTIVE HEALTH PO) Take 1 capsule by mouth in the morning.   loratadine  (CLARITIN ) 10 MG tablet Take 10 mg by mouth in the morning.   methotrexate  (RHEUMATREX) 2.5 MG tablet Take 10 mg by mouth every Sunday. 6 tablets   metoprolol  succinate (TOPROL  XL) 25 MG 24 hr tablet Take 1 tablet (25 mg total) by mouth in the morning and at bedtime.   metoprolol  tartrate (LOPRESSOR ) 25 MG tablet Take 0.5 tablets (12.5 mg total) by mouth daily as needed.   Multiple Vitamins-Minerals (ONE A DAY WOMEN 50 PLUS) CHEW Chew 1 tablet by mouth daily.   Polyethyl Glycol-Propyl Glycol (SYSTANE  ULTRA) 0.4-0.3 % SOLN Place 1 drop into both eyes 3 (three) times daily as needed.   Respiratory Therapy Supplies (FLUTTER) DEVI Twice a day and prn as needed, may increase if feeling worse   tobramycin-dexamethasone  (TOBRADEX) ophthalmic solution Instill 4 drops twice daily into right ear.   Zoledronic  Acid (RECLAST  IV) Inject into the vein. IV yearly   No facility-administered encounter medications on file as of 08/27/2023.    Allergies (verified) Amoxicillin , Azithromycin , Colchicine , Doxycycline , Moxifloxacin , Nitrofurantoin , Sulfur, Albuterol , Amoxicillin -pot clavulanate, Avelox  [moxifloxacin  hcl in nacl], Benzonatate, Ciprofloxacin , Macrobid  [nitrofurantoin  macrocrystal], and Septra  [sulfamethoxazole-trimethoprim]   History: Past Medical History:  Diagnosis Date   Adenomatous colon polyp 10/2003   Allergic rhinitis    Allergy     SEASONAL   Bronchiectasis    Chronic sinusitis    Diverticulosis    GERD (gastroesophageal reflux disease)    Hearing loss    Hemorrhoids    Insomnia    Menopausal disorder    Osteoporosis    Palpitations    Paroxysmal A-fib (HCC)    Rheumatoid arthritis (HCC)    TMJ syndrome    Urticaria    Past Surgical History:  Procedure Laterality Date   ATRIAL FIBRILLATION ABLATION N/A 10/10/2022   Procedure: ATRIAL FIBRILLATION ABLATION;  Surgeon: Boyce Byes, MD;  Location: [facility];  Service: Cardiovascular;  Laterality: N/A;   COLONOSCOPY     NASAL SINUS SURGERY     TYMPANOSTOMY     VIDEO BRONCHOSCOPY Bilateral 07/16/2016   Procedure: VIDEO BRONCHOSCOPY WITH FLUORO;  Surgeon: Denson Flake, MD;  Location: [facility];  Service: Cardiopulmonary;  Laterality: Bilateral;   Family History  Problem Relation Age of Onset   Rectal cancer Mother        died age 67   Seizures Mother    Colon cancer Mother 93   Cancer Mother    Heart disease Father        dies age 57   Hypertension Father    Heart disease Maternal Grandfather    AVM Brother    Cancer Maternal Grandmother    Cancer Paternal Uncle        multiple uncles with cancer   Esophageal cancer Neg Hx    Social History   Socioeconomic History   Marital status: Widowed    Spouse name: [place]   Number of children: 0   Years of education: BSN   Highest education level: Bachelor's degree (e.g., BA, AB, BS)  Occupational History   Occupation: retired Charity fundraiser  Tobacco Use   Smoking status: Never    Passive exposure: Never   Smokeless tobacco: Never  Vaping Use   Vaping status: Never Used  Substance and Sexual Activity   Alcohol use: Yes    Alcohol/week: 1.0 - 2.0 standard drink of alcohol    Types: 1 - 2 Glasses of wine per week    Comment: 1-2 glasses of wine  a month 05/29/21   Drug use: No   Sexual activity: Not Currently  Other Topics Concern   Not on file  Social History Narrative   ** Merged History Encounter **       Lives with husband Caffeine  use: 1 cup tea per day   Social Drivers of Health   Financial Resource Strain: Low Risk  (08/27/2023)   Overall Financial Resource Strain (CARDIA)    Difficulty of Paying Living Expenses: Not hard at all  Food Insecurity: No Food Insecurity (08/27/2023)   Hunger Vital Sign  Worried About Programme researcher, broadcasting/film/video in the Last Year: Never true    Ran Out of Food in the Last Year: Never true  Transportation Needs: No Transportation Needs (08/27/2023)   PRAPARE - Administrator, Civil Service (Medical): No    Lack of Transportation (Non-Medical): No  Physical Activity: Insufficiently Active (08/27/2023)   Exercise Vital Sign    Days of Exercise per Week: 2 days    Minutes of Exercise per Session: 10 min  Stress: No Stress Concern Present (08/27/2023)   [employer] of Occupational Health - Occupational Stress Questionnaire    Feeling of Stress : Not at all  Social Connections: Moderately Integrated (08/27/2023)   Social Connection and Isolation Panel [NHANES]    Frequency of Communication with Friends and Family: More than three times a week    Frequency of Social Gatherings with Friends and Family: Once a week    Attends Religious Services: 1 to 4 times per year    Active Member of [employer] or Organizations: Yes    Attends Banker Meetings: 1 to 4 times per year    Marital Status: Widowed    Tobacco Counseling Counseling given: No    Clinical Intake:  Pre-visit preparation completed: Yes  Pain : No/denies pain     BMI - recorded: 20.12 Nutritional Risks: None Diabetes: No  No results found for: "HGBA1C"   How often do you need to have someone help you when you read instructions, pamphlets, or other written materials from your doctor or pharmacy?: 1 -  Never  Interpreter Needed?: No  Information entered by :: Freddrick Jaffe, CMA   Activities of Daily Living     08/27/2023    9:34 AM 08/23/2023   10:54 AM  In your present state of health, do you have any difficulty performing the following activities:  Hearing? 0 0  Vision? 0 0  Difficulty concentrating or making decisions? 0 0  Walking or climbing stairs? 0 0  Dressing or bathing? 0 0  Doing errands, shopping? 0 0  Preparing Food and eating ? N N  Using the Toilet? N N  In the past six months, have you accidently leaked urine? Colie Dawes  Comment wears a pad sometimes   Do you have problems with loss of bowel control? N N  Managing your Medications? N N  Managing your Finances? N N  Housekeeping or managing your Housekeeping? N N    Patient Care Team: Plotnikov, Oakley Bellman, MD as PCP - General (Internal Medicine) Croitoru, Karyl Paget, MD as PCP - Cardiology (Cardiology) Boyce Byes, MD as PCP - Electrophysiology (Cardiology) Terri Fester, MD as Consulting Physician (Obstetrics and Gynecology) Asencion Blacksmith, MD (Inactive) as Consulting Physician (Gastroenterology) Denson Flake, MD as Consulting Physician (Pulmonary Disease) Ammon Bales, MD (Inactive) as Consulting Physician (Otolaryngology) Thais Fill, MD as Consulting Physician (Dermatology) Cindra Cree, MD as Consulting Physician (Ophthalmology) Stefan Edge, MD as Consulting Physician (Rheumatology)  Indicate any recent Medical Services you may have received from other than [facility] providers in the past year (date may be approximate).     Assessment:   This is a routine wellness examination for Monica [name].  Hearing/Vision screen Hearing Screening - Comments:: Wears hearing aid (left ear) Vision Screening - Comments:: Wears rx glasses - up to date with routine eye exams with Dr Ambrosio Junker   Goals Addressed               This Visit's Progress  Patient Stated (pt-stated)        Patient stated she plans to  stay healthy and not get COVID       Depression Screen     08/27/2023    9:36 AM 05/26/2023    9:15 AM 01/22/2023    8:48 AM 01/16/2023    9:13 AM 09/02/2022   10:32 AM 07/30/2022    1:54 PM 07/17/2022    1:17 PM  PHQ 2/9 Scores  PHQ - 2 Score 0 0 0 0 0 0 0  PHQ- 9 Score 0  0 0       Fall Risk     08/27/2023    9:35 AM 08/23/2023   10:54 AM 07/27/2023    1:44 PM 05/26/2023    9:15 AM 01/22/2023    8:48 AM  Fall Risk   Falls in the past year? 0 0 0 0 1  Number falls in past yr: 0   0 0  Injury with Fall? 0   0 1  Risk for fall due to : No Fall Risks   No Fall Risks History of fall(s)  Follow up Falls prevention discussed;Falls evaluation completed   Falls evaluation completed Falls evaluation completed    MEDICARE RISK AT HOME:  Medicare Risk at Home Any stairs in or around the home?: Yes If so, are there any without handrails?: No Home free of loose throw rugs in walkways, pet beds, electrical cords, etc?: Yes Adequate lighting in your home to reduce risk of falls?: Yes Life alert?: No Use of a cane, walker or w/c?: No Grab bars in the bathroom?: Yes Shower chair or bench in shower?: No Elevated toilet seat or a handicapped toilet?: No  TIMED UP AND GO:  Was the test performed?  No  Cognitive Function: 6CIT completed        08/27/2023    9:38 AM 07/30/2022    1:56 PM  6CIT Screen  What Year? 0 points 0 points  What month? 0 points 0 points  What time? 0 points 0 points  Count back from 20 0 points 0 points  Months in reverse 0 points 0 points  Repeat phrase 2 points 0 points  Total Score 2 points 0 points    Immunizations Immunization History  Administered Date(s) Administered   Fluad Quad(high Dose 65+) 12/29/2018, 02/21/2020, 02/12/2021, 03/25/2022   Fluad Trivalent(High Dose 65+) 02/27/2023   Influenza Split 03/03/2009, 02/25/2011, 02/05/2012   Influenza Whole 03/02/2008, 03/02/2009, 02/06/2010   Influenza, High Dose Seasonal PF 01/28/2017    Influenza,inj,Quad PF,6+ Mos 02/04/2013, 01/24/2014, 02/02/2015, 02/01/2016, 01/22/2018   Influenza-Unspecified 01/04/2017   PFIZER Comirnaty (Gray Top)Covid-19 Tri-Sucrose Vaccine 09/12/2020   PFIZER(Purple Top)SARS-COV-2 Vaccination 05/27/2019, 06/17/2019, 02/08/2020   Pfizer Covid-19 Vaccine Bivalent Booster 79yrs & up 02/06/2021   Pfizer(Comirnaty )Fall Seasonal Vaccine 12 years and older 02/25/2022, 02/14/2023   Pneumococcal Conjugate-13 01/04/2013, 02/03/2013, 02/16/2013   Pneumococcal Polysaccharide-23 03/11/2003, 01/16/2009, 09/26/2014   Respiratory Syncytial Virus Vaccine ,Recomb Aduvanted(Arexvy ) 03/28/2023   Td 06/06/2001   Tdap 07/17/2012   Tetanus 05/06/2001   Zoster Recombinant(Shingrix) 05/07/2007, 12/09/2017, 05/28/2018   Zoster, Live 05/07/2007    Screening Tests Health Maintenance  Topic Date Due   DTaP/Tdap/Td (3 - Td or Tdap) 07/18/2022   COVID-19 Vaccine (8 - Pfizer risk 2024-25 season) 08/15/2023   INFLUENZA VACCINE  12/05/2023   Colonoscopy  08/15/2024   Medicare Annual Wellness (AWV)  08/26/2024   Pneumonia Vaccine 93+ Years old  Completed   DEXA SCAN  Completed  Hepatitis C Screening  Completed   Zoster Vaccines- Shingrix  Completed   HPV VACCINES  Aged Out   Meningococcal B Vaccine  Aged Out    Health Maintenance  Health Maintenance Due  Topic Date Due   DTaP/Tdap/Td (3 - Td or Tdap) 07/18/2022   COVID-19 Vaccine (8 - Pfizer risk 2024-25 season) 08/15/2023   Health Maintenance Items Addressed: 08/27/2023   Additional Screening:  Vision Screening: Recommended annual ophthalmology exams for early detection of glaucoma and other disorders of the eye.  Dental Screening: Recommended annual dental exams for proper oral hygiene  Community Resource Referral / Chronic Care Management: CRR required this visit?  No   CCM required this visit?  No     Plan:     I have personally reviewed and noted the following in the patient's chart:   Medical  and social history Use of alcohol, tobacco or illicit drugs  Current medications and supplements including opioid prescriptions. Patient is not currently taking opioid prescriptions. Functional ability and status Nutritional status Physical activity Advanced directives List of other physicians Hospitalizations, surgeries, and ER visits in previous 12 months Vitals Screenings to include cognitive, depression, and falls Referrals and appointments  In addition, I have reviewed and discussed with patient certain preventive protocols, quality metrics, and best practice recommendations. A written personalized care plan for preventive services as well as general preventive health recommendations were provided to patient.     Patria Bookbinder, CMA   08/27/2023   After Visit Summary: (In Person-Declined) Patient declined AVS at this time.  Notes: Nothing significant to report at this time.  Medical screening examination/treatment/procedure(s) were performed by non-physician practitioner and as supervising physician I was immediately available for consultation/collaboration.  I agree with above. Adelaide Holy, MD

## 2023-08-29 ENCOUNTER — Other Ambulatory Visit (HOSPITAL_COMMUNITY): Payer: Self-pay

## 2023-08-29 ENCOUNTER — Encounter: Payer: Self-pay | Admitting: Family Medicine

## 2023-08-29 ENCOUNTER — Other Ambulatory Visit (HOSPITAL_BASED_OUTPATIENT_CLINIC_OR_DEPARTMENT_OTHER): Payer: Self-pay

## 2023-08-29 MED ORDER — BOOSTRIX 5-2.5-18.5 LF-MCG/0.5 IM SUSY
0.5000 mL | PREFILLED_SYRINGE | INTRAMUSCULAR | 0 refills | Status: DC
  Filled 2023-08-29: qty 0.5, 7d supply, fill #0
  Filled 2023-08-29: qty 0.5, 1d supply, fill #0

## 2023-09-02 DIAGNOSIS — M0579 Rheumatoid arthritis with rheumatoid factor of multiple sites without organ or systems involvement: Secondary | ICD-10-CM | POA: Diagnosis not present

## 2023-09-08 DIAGNOSIS — Z681 Body mass index (BMI) 19 or less, adult: Secondary | ICD-10-CM | POA: Diagnosis not present

## 2023-09-08 DIAGNOSIS — M0579 Rheumatoid arthritis with rheumatoid factor of multiple sites without organ or systems involvement: Secondary | ICD-10-CM | POA: Diagnosis not present

## 2023-09-08 DIAGNOSIS — M1991 Primary osteoarthritis, unspecified site: Secondary | ICD-10-CM | POA: Diagnosis not present

## 2023-09-08 DIAGNOSIS — Z79899 Other long term (current) drug therapy: Secondary | ICD-10-CM | POA: Diagnosis not present

## 2023-09-30 DIAGNOSIS — M0579 Rheumatoid arthritis with rheumatoid factor of multiple sites without organ or systems involvement: Secondary | ICD-10-CM | POA: Diagnosis not present

## 2023-10-22 ENCOUNTER — Ambulatory Visit: Admitting: Internal Medicine

## 2023-10-28 DIAGNOSIS — M0579 Rheumatoid arthritis with rheumatoid factor of multiple sites without organ or systems involvement: Secondary | ICD-10-CM | POA: Diagnosis not present

## 2023-10-28 DIAGNOSIS — Z79899 Other long term (current) drug therapy: Secondary | ICD-10-CM | POA: Diagnosis not present

## 2023-10-29 ENCOUNTER — Ambulatory Visit: Admitting: Internal Medicine

## 2023-10-29 ENCOUNTER — Other Ambulatory Visit (HOSPITAL_COMMUNITY): Payer: Self-pay

## 2023-10-29 ENCOUNTER — Encounter: Payer: Self-pay | Admitting: Internal Medicine

## 2023-10-29 VITALS — BP 118/70 | HR 97 | Temp 98.0°F | Ht 60.0 in | Wt 103.0 lb

## 2023-10-29 DIAGNOSIS — D6869 Other thrombophilia: Secondary | ICD-10-CM | POA: Diagnosis not present

## 2023-10-29 DIAGNOSIS — I480 Paroxysmal atrial fibrillation: Secondary | ICD-10-CM

## 2023-10-29 DIAGNOSIS — G43909 Migraine, unspecified, not intractable, without status migrainosus: Secondary | ICD-10-CM

## 2023-10-29 DIAGNOSIS — M059 Rheumatoid arthritis with rheumatoid factor, unspecified: Secondary | ICD-10-CM | POA: Diagnosis not present

## 2023-10-29 DIAGNOSIS — M810 Age-related osteoporosis without current pathological fracture: Secondary | ICD-10-CM | POA: Diagnosis not present

## 2023-10-29 DIAGNOSIS — Z79899 Other long term (current) drug therapy: Secondary | ICD-10-CM | POA: Insufficient documentation

## 2023-10-29 DIAGNOSIS — L723 Sebaceous cyst: Secondary | ICD-10-CM | POA: Insufficient documentation

## 2023-10-29 MED ORDER — BUTALBITAL-ACETAMINOPHEN 50-300 MG PO TABS
1.0000 | ORAL_TABLET | Freq: Four times a day (QID) | ORAL | 1 refills | Status: AC | PRN
Start: 2023-10-29 — End: ?
  Filled 2023-10-29 – 2023-11-18 (×2): qty 60, 15d supply, fill #0

## 2023-10-29 NOTE — Assessment & Plan Note (Signed)
 On MTX and Cimzia  monthly

## 2023-10-29 NOTE — Assessment & Plan Note (Signed)
In NSR now 

## 2023-10-29 NOTE — Assessment & Plan Note (Signed)
 Worse BUPAP  prn  Potential benefits of a long term BUPAP  use as well as potential risks  and complications were explained to the patient and were aknowledged.

## 2023-10-29 NOTE — Assessment & Plan Note (Signed)
 Per GYN - Reclast  since 2023 Vit D3+K2

## 2023-10-29 NOTE — Progress Notes (Signed)
 Subjective:  Patient ID: Monica [name], female    DOB: [birth year]  Age: 78 y.o. MRN: [id number]  CC: Medical Management of Chronic Issues (3 mnth f/u)   HPI EULONDA ANDALON presents for HAs, A fib, RA   Outpatient Medications Prior to Visit  Medication Sig Dispense Refill  . acetaminophen  (TYLENOL ) 500 MG tablet Take 500 mg by mouth every 6 (six) hours as needed for moderate pain or headache.    . Calcium -Magnesium -Zinc (CAL-MAG-ZINC PO) Take 1 tablet by mouth daily.    . certolizumab pegol  (CIMZIA ) 2 X 200 MG KIT Per Rheumatology (Patient taking differently: Inject 400 mg into the skin every 30 (thirty) days. Per Rheumatology) 1 kit 1  . Cholecalciferol (VITAMIN D ) 1000 UNITS capsule Take 1,000 Units by mouth daily with lunch.    . dextromethorphan  (DELSYM ) 30 MG/5ML liquid Take 30 mg by mouth at bedtime as needed for cough.     . ELIQUIS  5 MG TABS tablet TAKE 1 TABLET TWICE A DAY 180 tablet 3  . Estradiol 10 MCG TABS Place 10 mcg vaginally 3 (three) times a week.    . fluticasone  (FLONASE  ALLERGY  RELIEF) 50 MCG/ACT nasal spray Place 1 spray into both nostrils as needed.    . folic acid (FOLVITE) 1 MG tablet Take 2 mg by mouth daily with lunch.    . guaiFENesin  (MUCINEX ) 600 MG 12 hr tablet Take 600 mg by mouth 2 (two) times daily as needed (congestion/cough).    . Lactobacillus-Inulin (CULTURELLE DIGESTIVE HEALTH PO) Take 1 capsule by mouth in the morning.    . loratadine  (CLARITIN ) 10 MG tablet Take 10 mg by mouth in the morning.    . methotrexate  (RHEUMATREX) 2.5 MG tablet Take 10 mg by mouth every Sunday. 6 tablets    . metoprolol  succinate (TOPROL  XL) 25 MG 24 hr tablet Take 1 tablet (25 mg total) by mouth in the morning and at bedtime. 180 tablet 3  . metoprolol  tartrate (LOPRESSOR ) 25 MG tablet Take 0.5 tablets (12.5 mg total) by mouth daily as needed.    . Multiple Vitamins-Minerals (ONE A DAY WOMEN 50 PLUS) CHEW Chew 1 tablet by mouth daily.    . Polyethyl Glycol-Propyl  Glycol (SYSTANE ULTRA) 0.4-0.3 % SOLN Place 1 drop into both eyes 3 (three) times daily as needed.    SABRA Respiratory Therapy Supplies (FLUTTER) DEVI Twice a day and prn as needed, may increase if feeling worse 1 each 0  . tobramycin-dexamethasone  (TOBRADEX) ophthalmic solution Instill 4 drops twice daily into right ear.    . Zoledronic  Acid (RECLAST  IV) Inject into the vein. IV yearly    . doxycycline  (VIBRA -TABS) 100 MG tablet Take 1 tablet (100 mg total) by mouth 2 (two) times daily. 20 tablet 0  . Tdap (BOOSTRIX ) 5-2.5-18.5 LF-MCG/0.5 injection Inject 0.5 mLs into the muscle once a week. 0.5 mL 0   No facility-administered medications prior to visit.    ROS: Review of Systems  Constitutional:  Negative for activity change, appetite change, chills, fatigue and unexpected weight change.  HENT:  Negative for congestion, mouth sores and sinus pressure.   Eyes:  Negative for visual disturbance.  Respiratory:  Negative for cough and chest tightness.   Gastrointestinal:  Negative for abdominal pain and nausea.  Genitourinary:  Negative for difficulty urinating, frequency and vaginal pain.  Musculoskeletal:  Positive for arthralgias. Negative for back pain and gait problem.  Skin:  Negative for pallor and rash.  Neurological:  Positive for headaches. Negative  for dizziness, tremors, weakness and numbness.  Psychiatric/Behavioral:  Negative for confusion, sleep disturbance and suicidal ideas.     Objective:  BP 118/70   Pulse 97   Temp 98 F (36.7 C) (Oral)   Ht 5' (1.524 m)   Wt 103 lb (46.7 kg)   SpO2 98%   BMI 20.12 kg/m   BP Readings from Last 3 Encounters:  10/29/23 118/70  08/27/23 112/60  08/26/23 114/70    Wt Readings from Last 3 Encounters:  10/29/23 103 lb (46.7 kg)  08/27/23 103 lb (46.7 kg)  08/26/23 103 lb 3.2 oz (46.8 kg)    Physical Exam Constitutional:      General: She is not in acute distress.    Appearance: She is well-developed.  HENT:     Head:  Normocephalic.     Right Ear: External ear normal.     Left Ear: External ear normal.     Nose: Nose normal.   Eyes:     General:        Right eye: No discharge.        Left eye: No discharge.     Conjunctiva/sclera: Conjunctivae normal.     Pupils: Pupils are equal, round, and reactive to light.   Neck:     Thyroid : No thyromegaly.     Vascular: No JVD.     Trachea: No tracheal deviation.   Cardiovascular:     Rate and Rhythm: Normal rate and regular rhythm.     Heart sounds: Normal heart sounds.  Pulmonary:     Effort: No respiratory distress.     Breath sounds: No stridor. No wheezing.  Abdominal:     General: Bowel sounds are normal. There is no distension.     Palpations: Abdomen is soft. There is no mass.     Tenderness: There is no abdominal tenderness. There is no guarding or rebound.   Musculoskeletal:        General: No tenderness.     Cervical back: Normal range of motion and neck supple. No rigidity.     Right lower leg: No edema.     Left lower leg: No edema.  Lymphadenopathy:     Cervical: No cervical adenopathy.   Skin:    Findings: No erythema or rash.   Neurological:     Cranial Nerves: No cranial nerve deficit.     Motor: No abnormal muscle tone.     Coordination: Coordination normal.     Deep Tendon Reflexes: Reflexes normal.   Psychiatric:        Behavior: Behavior normal.        Thought Content: Thought content normal.        Judgment: Judgment normal.    Lab Results  Component Value Date   WBC 12.9 (H) 01/26/2023   HGB 13.3 01/26/2023   HCT 40.6 01/26/2023   PLT 357 01/26/2023   GLUCOSE 130 (H) 08/12/2023   CHOL 141 08/12/2023   TRIG 88.0 08/12/2023   HDL 51.80 08/12/2023   LDLCALC 71 08/12/2023   ALT 16 08/12/2023   AST 18 08/12/2023   NA 136 08/12/2023   K 4.1 08/12/2023   CL 100 08/12/2023   CREATININE 0.72 08/12/2023   BUN 27 (H) 08/12/2023   CO2 27 08/12/2023   TSH 3.36 08/12/2023    CT Chest Wo Contrast Result Date:  03/17/2023 CLINICAL DATA:  Diffuse/interstitial lung disease, bronchiectasis, COPD. EXAM: CT CHEST WITHOUT CONTRAST TECHNIQUE: Multidetector CT imaging of the chest was  performed following the standard protocol without IV contrast. RADIATION DOSE REDUCTION: This exam was performed according to the departmental dose-optimization program which includes automated exposure control, adjustment of the mA and/or kV according to patient size and/or use of iterative reconstruction technique. COMPARISON:  Cardiac CT 10/03/2022, CT chest 02/12/2022. FINDINGS: Cardiovascular: Atherosclerotic calcification of the aorta. Heart size normal. No pericardial effusion. Mediastinum/Nodes: 8 mm low-attenuation lesion in the right thyroid . No follow-up recommended. (Ref: J Am Coll Radiol. 2015 Feb;12(2): 143-50).No pathologically enlarged mediastinal or axillary lymph nodes. Hilar regions are difficult to definitively evaluate without IV contrast. Air in the esophagus can be seen with dysmotility. Lungs/Pleura: Biapical pleuroparenchymal scarring. Cylindrical and mildly varicose bronchiectasis with patchy peribronchovascular nodularity and mucoid impaction, grossly unchanged from 02/12/2022. No pleural fluid. Airway is otherwise unremarkable. Upper Abdomen: Probable tiny cysts in the liver. No specific follow-up necessary. Tiny hiatal hernia. Visualized portions of the liver, gallbladder, adrenal glands, kidneys, spleen, pancreas, stomach and bowel are otherwise grossly unremarkable. No upper abdominal adenopathy. Musculoskeletal: Degenerative changes in the spine. Pectus deformity. IMPRESSION: 1. Bronchiectasis with patchy peribronchovascular nodularity and mucoid impaction, unchanged from 02/12/2022. 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 2.  Aortic atherosclerosis (ICD10-I70.0). Electronically Signed   By: Newell Eke M.D.   On: 03/17/2023 08:23    Assessment & Plan:   Problem List Items Addressed This Visit      Osteoporosis   Per GYN - Reclast  since 2023 Vit D3+K2      Paroxysmal A-fib (HCC)   In NSR now      Hypercoagulable state due to paroxysmal atrial fibrillation (HCC)   On Eliquis       Rheumatoid arthritis (HCC)   On MTX and Cimzia  monthly      Relevant Medications   Butalbital -Acetaminophen  50-300 MG TABS   Migraine headache - Primary   Worse BUPAP  prn  Potential benefits of a long term BUPAP  use as well as potential risks  and complications were explained to the patient and were aknowledged.       Relevant Medications   Butalbital -Acetaminophen  50-300 MG TABS      Meds ordered this encounter  Medications  . Butalbital -Acetaminophen  50-300 MG TABS    Sig: 1 po qid prn bad headaches    Dispense:  60 tablet    Refill:  1      Follow-up: Return in about 6 months (around 04/29/2024) for a follow-up visit.  Marolyn Noel, MD

## 2023-10-29 NOTE — Assessment & Plan Note (Signed)
 On Eliquis

## 2023-10-30 ENCOUNTER — Other Ambulatory Visit (HOSPITAL_COMMUNITY): Payer: Self-pay

## 2023-10-30 DIAGNOSIS — H2513 Age-related nuclear cataract, bilateral: Secondary | ICD-10-CM | POA: Diagnosis not present

## 2023-10-30 DIAGNOSIS — H524 Presbyopia: Secondary | ICD-10-CM | POA: Diagnosis not present

## 2023-10-31 ENCOUNTER — Other Ambulatory Visit (HOSPITAL_COMMUNITY): Payer: Self-pay

## 2023-11-04 ENCOUNTER — Other Ambulatory Visit (HOSPITAL_COMMUNITY): Payer: Self-pay

## 2023-11-08 ENCOUNTER — Other Ambulatory Visit (HOSPITAL_COMMUNITY): Payer: Self-pay

## 2023-11-13 ENCOUNTER — Other Ambulatory Visit (HOSPITAL_COMMUNITY): Payer: Self-pay

## 2023-11-13 DIAGNOSIS — M79661 Pain in right lower leg: Secondary | ICD-10-CM | POA: Diagnosis not present

## 2023-11-15 ENCOUNTER — Other Ambulatory Visit: Payer: Self-pay | Admitting: Cardiovascular Disease

## 2023-11-15 DIAGNOSIS — I480 Paroxysmal atrial fibrillation: Secondary | ICD-10-CM

## 2023-11-17 NOTE — Telephone Encounter (Signed)
 Prescription refill request for Eliquis  received. Indication:afib Last office visit:10/24 Scr:0.72  4/25 Age: 78 Weight:46.7  kg  Prescription refilled

## 2023-11-18 ENCOUNTER — Other Ambulatory Visit (HOSPITAL_COMMUNITY): Payer: Self-pay

## 2023-11-20 DIAGNOSIS — H6121 Impacted cerumen, right ear: Secondary | ICD-10-CM | POA: Diagnosis not present

## 2023-11-25 DIAGNOSIS — M0579 Rheumatoid arthritis with rheumatoid factor of multiple sites without organ or systems involvement: Secondary | ICD-10-CM | POA: Diagnosis not present

## 2023-12-15 DIAGNOSIS — L570 Actinic keratosis: Secondary | ICD-10-CM | POA: Diagnosis not present

## 2023-12-23 DIAGNOSIS — Z79899 Other long term (current) drug therapy: Secondary | ICD-10-CM | POA: Diagnosis not present

## 2023-12-23 DIAGNOSIS — R5383 Other fatigue: Secondary | ICD-10-CM | POA: Diagnosis not present

## 2023-12-23 DIAGNOSIS — M0579 Rheumatoid arthritis with rheumatoid factor of multiple sites without organ or systems involvement: Secondary | ICD-10-CM | POA: Diagnosis not present

## 2023-12-25 DIAGNOSIS — M810 Age-related osteoporosis without current pathological fracture: Secondary | ICD-10-CM | POA: Diagnosis not present

## 2023-12-25 DIAGNOSIS — Z124 Encounter for screening for malignant neoplasm of cervix: Secondary | ICD-10-CM | POA: Diagnosis not present

## 2023-12-25 DIAGNOSIS — N952 Postmenopausal atrophic vaginitis: Secondary | ICD-10-CM | POA: Diagnosis not present

## 2023-12-25 DIAGNOSIS — Z1331 Encounter for screening for depression: Secondary | ICD-10-CM | POA: Diagnosis not present

## 2024-01-08 ENCOUNTER — Encounter: Payer: Self-pay | Admitting: Cardiology

## 2024-01-21 DIAGNOSIS — M810 Age-related osteoporosis without current pathological fracture: Secondary | ICD-10-CM | POA: Diagnosis not present

## 2024-01-28 ENCOUNTER — Ambulatory Visit: Payer: Self-pay

## 2024-01-28 NOTE — Telephone Encounter (Signed)
 FYI Only or Action Required?: FYI only for provider.  Patient was last seen in primary care on 10/29/2023 by Plotnikov, Karlynn GAILS, MD.  Called Nurse Triage reporting URI.  Symptoms began about a month ago.  Interventions attempted: Rest, hydration, or home remedies.  Symptoms are: gradually worsening.  Triage Disposition: See PCP When Office is Open (Within 3 Days)  Patient/caregiver understands and will follow disposition?:  Reason for Disposition  [1] Sinus congestion (pressure, fullness) AND [2] present > 10 days  Answer Assessment - Initial Assessment Questions Sinus pain, states head feels full, congestion, fatigue, nausea. Has chronic cough that is worse. Patient denies higher acuity questions. Pt to call back if symptoms worsen prior to appt.    1. LOCATION: Where does it hurt?      Multiple  2. ONSET: When did the sinus pain start?  (e.g., hours, days)      4 weeks ago  3. SEVERITY: How bad is the pain?   (Scale 0-10; or none, mild, moderate or severe)     Becoming more severe  4. FEVER: Do you have a fever? If Yes, ask: What is it, how was it measured, and when did it start?      Denies  Protocols used: Sinus Pain or Congestion-A-AH Copied from CRM #[id number]. Topic: Clinical - Red Word Triage >> Jan 28, 2024 10:09 AM Monica [name] wrote: [name] that prompted transfer to Nurse Triage: Patient states she is having severe headaches, extreme fatigue, coughing up stuff so much,  that has made her chest hurt

## 2024-01-29 ENCOUNTER — Ambulatory Visit: Admitting: Family Medicine

## 2024-01-29 ENCOUNTER — Encounter: Payer: Self-pay | Admitting: Family Medicine

## 2024-01-29 ENCOUNTER — Other Ambulatory Visit (HOSPITAL_COMMUNITY): Payer: Self-pay

## 2024-01-29 ENCOUNTER — Ambulatory Visit (INDEPENDENT_AMBULATORY_CARE_PROVIDER_SITE_OTHER)

## 2024-01-29 ENCOUNTER — Ambulatory Visit: Payer: Self-pay | Admitting: Family Medicine

## 2024-01-29 VITALS — BP 132/84 | HR 98 | Temp 97.8°F | Ht 60.0 in | Wt 102.0 lb

## 2024-01-29 DIAGNOSIS — R053 Chronic cough: Secondary | ICD-10-CM | POA: Diagnosis not present

## 2024-01-29 DIAGNOSIS — J22 Unspecified acute lower respiratory infection: Secondary | ICD-10-CM | POA: Diagnosis not present

## 2024-01-29 DIAGNOSIS — R5383 Other fatigue: Secondary | ICD-10-CM | POA: Diagnosis not present

## 2024-01-29 DIAGNOSIS — R0602 Shortness of breath: Secondary | ICD-10-CM | POA: Diagnosis not present

## 2024-01-29 DIAGNOSIS — R079 Chest pain, unspecified: Secondary | ICD-10-CM | POA: Diagnosis not present

## 2024-01-29 DIAGNOSIS — R0789 Other chest pain: Secondary | ICD-10-CM | POA: Diagnosis not present

## 2024-01-29 DIAGNOSIS — R918 Other nonspecific abnormal finding of lung field: Secondary | ICD-10-CM | POA: Diagnosis not present

## 2024-01-29 DIAGNOSIS — R110 Nausea: Secondary | ICD-10-CM

## 2024-01-29 LAB — COMPREHENSIVE METABOLIC PANEL WITH GFR
ALT: 16 U/L (ref 0–35)
AST: 20 U/L (ref 0–37)
Albumin: 4.2 g/dL (ref 3.5–5.2)
Alkaline Phosphatase: 61 U/L (ref 39–117)
BUN: 19 mg/dL (ref 6–23)
CO2: 28 meq/L (ref 19–32)
Calcium: 9.2 mg/dL (ref 8.4–10.5)
Chloride: 99 meq/L (ref 96–112)
Creatinine, Ser: 0.67 mg/dL (ref 0.40–1.20)
GFR: 83.45 mL/min (ref >60.00)
Glucose, Bld: 93 mg/dL (ref 70–99)
Potassium: 4.1 meq/L (ref 3.5–5.1)
Sodium: 134 meq/L — ABNORMAL LOW (ref 135–145)
Total Bilirubin: 0.3 mg/dL (ref 0.2–1.2)
Total Protein: 7.3 g/dL (ref 6.0–8.3)

## 2024-01-29 LAB — CBC WITH DIFFERENTIAL/PLATELET
Basophils Absolute: 0 K/uL (ref 0.0–0.1)
Basophils Relative: 0.2 % (ref 0.0–3.0)
Eosinophils Absolute: 0.2 K/uL (ref 0.0–0.7)
Eosinophils Relative: 2.2 % (ref 0.0–5.0)
HCT: 37.8 % (ref 36.0–46.0)
Hemoglobin: 12.8 g/dL (ref 12.0–15.0)
Lymphocytes Relative: 14.3 % (ref 12.0–46.0)
Lymphs Abs: 1.4 K/uL (ref 0.7–4.0)
MCHC: 33.9 g/dL (ref 30.0–36.0)
MCV: 95.7 fl (ref 78.0–100.0)
Monocytes Absolute: 0.9 K/uL (ref 0.1–1.0)
Monocytes Relative: 9.3 % (ref 3.0–12.0)
Neutro Abs: 7.3 K/uL (ref 1.4–7.7)
Neutrophils Relative %: 74 % (ref 43.0–77.0)
Platelets: 356 K/uL (ref 150.0–400.0)
RBC: 3.94 Mil/uL (ref 3.87–5.11)
RDW: 13.3 % (ref 11.5–15.5)
WBC: 9.8 K/uL (ref 4.0–10.5)

## 2024-01-29 LAB — POC COVID19 BINAXNOW: SARS Coronavirus 2 Ag: NEGATIVE

## 2024-01-29 LAB — TSH: TSH: 4.83 u[IU]/mL (ref 0.35–5.50)

## 2024-01-29 LAB — TROPONIN I (HIGH SENSITIVITY): High Sens Troponin I: 6 ng/L (ref 2–17)

## 2024-01-29 LAB — MAGNESIUM: Magnesium: 2.1 mg/dL (ref 1.5–2.5)

## 2024-01-29 LAB — D-DIMER, QUANTITATIVE: D-Dimer, Quant: 0.29 ug{FEU}/mL (ref <0.50)

## 2024-01-29 MED ORDER — DOXYCYCLINE HYCLATE 100 MG PO TABS
100.0000 mg | ORAL_TABLET | Freq: Two times a day (BID) | ORAL | 0 refills | Status: DC
  Filled 2024-01-29: qty 20, 10d supply, fill #0

## 2024-01-29 NOTE — Progress Notes (Unsigned)
 Subjective:    Discussed the use of AI scribe software for clinical note transcription with the patient, who gave verbal consent to proceed.  History of Present Illness Monica [name] is a 78 year old female with bronchiectasis and atrial fibrillation who presents with a four-week history of upper respiratory symptoms.  Upper respiratory symptoms - Nasal congestion, fatigue, and headaches present for four weeks - Symptoms began after dental work, which led to an infection treated with doxycycline  - Symptoms initially thought to be allergies - Worsening fatigue and nausea over the past weekend - Occasional ear pain, attributed to dental issues - No fever or sore throat - Two negative COVID tests, one on Tuesday and another today  Cough and chest discomfort - Cough has become more dry over the past four weeks - Achy sensation in the bronchial area - Chest discomfort described as an achy feel - No hemoptysis or productive sputum  Fatigue and functional impact - Fatigue is the most severe symptom - Significant impact on daily activities, with exhaustion after outings  Atrial fibrillation and cardiac symptoms - History of atrial fibrillation, on Eliquis  and metoprolol  - No recent episodes of atrial fibrillation since ablation one year ago - Sensation of increased pulse when unwell  Medication use and modifications - Uses Tylenol , Mucinex , and nasal sprays for symptom relief - Postponed Cimzia  dose due to recent antibiotic use and feeling unwell     ROS as in subjective.   Objective: Vitals:   01/29/24 0806  BP: 132/84  Pulse: 98  Temp: 97.8 F (36.6 C)  SpO2: 95%    General appearance: Alert, WD/WN, no distress, mildly ill appearing                             Skin: warm, no rash                           Head: no sinus tenderness                            Eyes: conjunctiva normal, corneas clear, PERRLA                            Ears: pearly TMs,  external ear canals normal                          Nose: septum midline, turbinates swollen, with erythema and clear discharge             Mouth/throat: MMM, tongue normal, mild pharyngeal erythema                           Neck: supple, no adenopathy, no thyromegaly, nontender                          Heart: RRR, normal S1, S2, no murmurs                         Lungs: CTA bilaterally, no wheezes, rales, or rhonchi      Assessment/Plan:  Assessment and Plan Assessment & Plan Upper respiratory symptoms with fatigue, chest discomfort, and headache Four-week history of upper respiratory symptoms, fatigue, nasal congestion, headache, and  nausea, worsened over the weekend. Differential includes sinusitis, respiratory infection, or cardiac issues. Recent dental work and doxycycline  for dental infection. No fever, negative COVID tests, dry cough with achy bronchial sensation. No significant EKG findings. Plan to rule out pneumonia and cardiac issues. Discussed possibility of pneumonia without fever or significant coughing. D-dimer test to rule out blood clots, with further imaging if elevated. - Order chest x-ray - Order blood work including troponin and D-dimer - Check white blood cell count - Advise symptomatic treatment with acetaminophen  and guaifenesin  - Advise hydration and rest - Evaluate chest x-ray and lab results to determine need for antibiotics - EKG shows NSR, rate 89, no acute ST-T wave changes   Bronchiectasis Dry cough present, but no wheezing or significant respiratory distress. No indication for steroids.  Atrial fibrillation, status post ablation Atrial fibrillation, status post successful ablation one year ago. Currently on apixaban  and metoprolol . No recent episodes of atrial fibrillation. - Scheduled follow-up with cardiologist and electrophysiologist  Rheumatoid arthritis on immunosuppressive therapy On methotrexate  and certolizumab pegol . Certolizumab pegol  postponed  due to recent antibiotic use and current illness. No recent blood work abnormalities noted.

## 2024-01-29 NOTE — Progress Notes (Signed)
 Please call her and let her know that her chest x-ray shows probable pneumonia.  I consulted with Dr. Garald and I will send in doxycycline  to treat this.  Please ask her to call and schedule with pulmonologist

## 2024-01-29 NOTE — Patient Instructions (Signed)
 Please go downstairs for labs and a chest x-ray before you leave

## 2024-02-03 ENCOUNTER — Encounter: Payer: Self-pay | Admitting: Internal Medicine

## 2024-02-03 ENCOUNTER — Ambulatory Visit: Admitting: Cardiovascular Disease

## 2024-02-03 ENCOUNTER — Other Ambulatory Visit (HOSPITAL_COMMUNITY): Payer: Self-pay

## 2024-02-03 ENCOUNTER — Ambulatory Visit: Admitting: Internal Medicine

## 2024-02-03 VITALS — BP 134/66 | HR 74 | Temp 97.9°F | Ht 61.5 in | Wt 101.2 lb

## 2024-02-03 DIAGNOSIS — M059 Rheumatoid arthritis with rheumatoid factor, unspecified: Secondary | ICD-10-CM

## 2024-02-03 DIAGNOSIS — J479 Bronchiectasis, uncomplicated: Secondary | ICD-10-CM | POA: Diagnosis not present

## 2024-02-03 DIAGNOSIS — R053 Chronic cough: Secondary | ICD-10-CM | POA: Diagnosis not present

## 2024-02-03 MED ORDER — LEVALBUTEROL HCL 0.63 MG/3ML IN NEBU: 0.6300 mg | INHALATION_SOLUTION | Freq: Four times a day (QID) | RESPIRATORY_TRACT | 5 refills | Status: DC | PRN

## 2024-02-03 MED ORDER — SODIUM CHLORIDE 0.9 % IN NEBU
3.0000 mL | INHALATION_SOLUTION | RESPIRATORY_TRACT | 3 refills | Status: DC | PRN
  Filled 2024-02-03: qty 90, 7d supply, fill #0

## 2024-02-03 NOTE — Progress Notes (Signed)
 Subjective:  Patient ID: Monica [name], female    DOB: [birth year]  Age: 78 y.o. MRN: [id number]  CC: Medical Management of Chronic Issues (States chest x-ray showed Pneumonia. And is not feeling better since last visit. Had diarrhea on Sunday. Doxycycline  causing diarrhea. )   HPI Monica [name] presents for Medical Management of Pneumonia - on Doxy. And is not feeling better since last visit. Had diarrhea on Sunday. Doxycycline  causing diarrhea.  Getting dental work done  Outpatient Medications Prior to Visit  Medication Sig Dispense Refill   acetaminophen  (TYLENOL ) 500 MG tablet Take 500 mg by mouth every 6 (six) hours as needed for moderate pain or headache.     Butalbital -Acetaminophen  50-300 MG TABS Take 1 tablet by mouth 4 (four) times daily as needed for bad headaches. 60 tablet 1   Calcium -Magnesium -Zinc (CAL-MAG-ZINC PO) Take 1 tablet by mouth daily.     certolizumab pegol  (CIMZIA ) 2 X 200 MG KIT Per Rheumatology (Patient taking differently: Inject 400 mg into the skin every 30 (thirty) days. Per Rheumatology) 1 kit 1   Cholecalciferol (VITAMIN D ) 1000 UNITS capsule Take 1,000 Units by mouth daily with lunch.     dextromethorphan  (DELSYM ) 30 MG/5ML liquid Take 30 mg by mouth at bedtime as needed for cough.      doxycycline  (VIBRA -TABS) 100 MG tablet Take 1 tablet (100 mg total) by mouth 2 (two) times daily. 20 tablet 0   ELIQUIS  5 MG TABS tablet TAKE 1 TABLET TWICE A DAY 180 tablet 3   Estradiol 10 MCG TABS Place 10 mcg vaginally 3 (three) times a week.     fluticasone  (FLONASE  ALLERGY  RELIEF) 50 MCG/ACT nasal spray Place 1 spray into both nostrils as needed.     folic acid (FOLVITE) 1 MG tablet Take 2 mg by mouth daily with lunch.     guaiFENesin  (MUCINEX ) 600 MG 12 hr tablet Take 600 mg by mouth 2 (two) times daily as needed (congestion/cough).     Lactobacillus-Inulin (CULTURELLE DIGESTIVE HEALTH PO) Take 1 capsule by mouth in the morning.     loratadine  (CLARITIN ) 10 MG  tablet Take 10 mg by mouth in the morning.     methotrexate  (RHEUMATREX) 2.5 MG tablet Take 10 mg by mouth every Sunday. 6 tablets     metoprolol  succinate (TOPROL  XL) 25 MG 24 hr tablet Take 1 tablet (25 mg total) by mouth in the morning and at bedtime. 180 tablet 3   metoprolol  tartrate (LOPRESSOR ) 25 MG tablet Take 0.5 tablets (12.5 mg total) by mouth daily as needed.     Multiple Vitamins-Minerals (ONE A DAY WOMEN 50 PLUS) CHEW Chew 1 tablet by mouth daily.     Polyethyl Glycol-Propyl Glycol (SYSTANE ULTRA) 0.4-0.3 % SOLN Place 1 drop into both eyes 3 (three) times daily as needed.     Respiratory Therapy Supplies (FLUTTER) DEVI Twice a day and prn as needed, may increase if feeling worse 1 each 0   tobramycin-dexamethasone  (TOBRADEX) ophthalmic solution Instill 4 drops twice daily into right ear.     Zoledronic  Acid (RECLAST  IV) Inject into the vein. IV yearly     No facility-administered medications prior to visit.    ROS: Review of Systems  Constitutional:  Positive for fatigue. Negative for activity change, appetite change, chills and unexpected weight change.  HENT:  Positive for dental problem. Negative for congestion, mouth sores and sinus pressure.   Eyes:  Negative for visual disturbance.  Respiratory:  Positive for cough. Negative for  chest tightness.   Cardiovascular:  Negative for chest pain.  Gastrointestinal:  Positive for diarrhea. Negative for abdominal pain and nausea.  Genitourinary:  Negative for difficulty urinating, frequency and vaginal pain.  Musculoskeletal:  Negative for back pain and gait problem.  Skin:  Negative for pallor and rash.  Neurological:  Positive for weakness. Negative for dizziness, tremors, numbness and headaches.  Psychiatric/Behavioral:  Negative for confusion, sleep disturbance and suicidal ideas. The patient is nervous/anxious.     Objective:  BP 134/66   Pulse 74   Temp 97.9 F (36.6 C) (Oral)   Ht 5' 1.5 (1.562 m)   Wt 101 lb 3.2  oz (45.9 kg)   SpO2 97%   BMI 18.81 kg/m   BP Readings from Last 3 Encounters:  02/03/24 134/66  01/29/24 132/84  10/29/23 118/70    Wt Readings from Last 3 Encounters:  02/03/24 101 lb 3.2 oz (45.9 kg)  01/29/24 102 lb (46.3 kg)  10/29/23 103 lb (46.7 kg)    Physical Exam Constitutional:      General: She is not in acute distress.    Appearance: Normal appearance. She is well-developed.  HENT:     Head: Normocephalic.     Right Ear: External ear normal.     Left Ear: External ear normal.     Nose: Nose normal.  Eyes:     General:        Right eye: No discharge.        Left eye: No discharge.     Conjunctiva/sclera: Conjunctivae normal.     Pupils: Pupils are equal, round, and reactive to light.  Neck:     Thyroid : No thyromegaly.     Vascular: No JVD.     Trachea: No tracheal deviation.  Cardiovascular:     Rate and Rhythm: Normal rate and regular rhythm.     Heart sounds: Normal heart sounds.  Pulmonary:     Effort: No respiratory distress.     Breath sounds: No stridor. No wheezing.  Abdominal:     General: Bowel sounds are normal. There is no distension.     Palpations: Abdomen is soft. There is no mass.     Tenderness: There is no abdominal tenderness. There is no guarding or rebound.  Musculoskeletal:        General: No tenderness.     Cervical back: Normal range of motion and neck supple. No rigidity.  Lymphadenopathy:     Cervical: No cervical adenopathy.  Skin:    Findings: No erythema or rash.  Neurological:     Cranial Nerves: No cranial nerve deficit.     Motor: No abnormal muscle tone.     Coordination: Coordination normal.     Deep Tendon Reflexes: Reflexes normal.  Psychiatric:        Behavior: Behavior normal.        Thought Content: Thought content normal.        Judgment: Judgment normal.     Lab Results  Component Value Date   WBC 9.8 01/29/2024   HGB 12.8 01/29/2024   HCT 37.8 01/29/2024   PLT 356.0 01/29/2024   GLUCOSE 93  01/29/2024   CHOL 141 08/12/2023   TRIG 88.0 08/12/2023   HDL 51.80 08/12/2023   LDLCALC 71 08/12/2023   ALT 16 01/29/2024   AST 20 01/29/2024   NA 134 (L) 01/29/2024   K 4.1 01/29/2024   CL 99 01/29/2024   CREATININE 0.67 01/29/2024   BUN 19 01/29/2024  CO2 28 01/29/2024   TSH 4.83 01/29/2024    CT Chest Wo Contrast Result Date: 03/17/2023 CLINICAL DATA:  Diffuse/interstitial lung disease, bronchiectasis, COPD. EXAM: CT CHEST WITHOUT CONTRAST TECHNIQUE: Multidetector CT imaging of the chest was performed following the standard protocol without IV contrast. RADIATION DOSE REDUCTION: This exam was performed according to the departmental dose-optimization program which includes automated exposure control, adjustment of the mA and/or kV according to patient size and/or use of iterative reconstruction technique. COMPARISON:  Cardiac CT 10/03/2022, CT chest 02/12/2022. FINDINGS: Cardiovascular: Atherosclerotic calcification of the aorta. Heart size normal. No pericardial effusion. Mediastinum/Nodes: 8 mm low-attenuation lesion in the right thyroid . No follow-up recommended. (Ref: J Am Coll Radiol. 2015 Feb;12(2): 143-50).No pathologically enlarged mediastinal or axillary lymph nodes. Hilar regions are difficult to definitively evaluate without IV contrast. Air in the esophagus can be seen with dysmotility. Lungs/Pleura: Biapical pleuroparenchymal scarring. Cylindrical and mildly varicose bronchiectasis with patchy peribronchovascular nodularity and mucoid impaction, grossly unchanged from 02/12/2022. No pleural fluid. Airway is otherwise unremarkable. Upper Abdomen: Probable tiny cysts in the liver. No specific follow-up necessary. Tiny hiatal hernia. Visualized portions of the liver, gallbladder, adrenal glands, kidneys, spleen, pancreas, stomach and bowel are otherwise grossly unremarkable. No upper abdominal adenopathy. Musculoskeletal: Degenerative changes in the spine. Pectus deformity.  IMPRESSION: 1. Bronchiectasis with patchy peribronchovascular nodularity and mucoid impaction, unchanged from 02/12/2022. 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 2.  Aortic atherosclerosis (ICD10-I70.0). Electronically Signed   By: Newell Eke M.D.   On: 03/17/2023 08:23    Assessment & Plan:   Problem List Items Addressed This Visit     Bronchiectasis without acute exacerbation (HCC) - Primary   On Doxy po, probiotic Sch Pulm f/u - Dr Shelah I wonder if she should try using an oscillating vest -so discussed with Dr. Shelah Nebulizer w/NS to help with sputum. Pt declined Xopenex  [facility] (old prescription printed)      Cough   On Doxy po, probiotic Sch Pulm f/u - Dr Shelah I wonder if she should try using an oscillating vest -so discussed with Dr. Shelah Nebulizer w/NS to help with sputum. Pt declined Xopenex  [facility]N (old prescription printed)       Rheumatoid arthritis (HCC)   On MTX and Cimzia  monthly         Meds ordered this encounter  Medications   levalbuterol  (XOPENEX ) 0.63 MG/3ML nebulizer solution    Sig: Take 3 mLs (0.63 mg total) by nebulization every 6 (six) hours as needed for wheezing or shortness of breath.    Dispense:  100 mL    Refill:  5   sodium chloride  0.9 % nebulizer solution    Sig: Take 3 mLs by nebulization as needed for wheezing.    Dispense:  90 mL    Refill:  3      Follow-up: Return in about 6 weeks (around 03/16/2024) for a follow-up visit.  Marolyn Noel, MD

## 2024-02-03 NOTE — Patient Instructions (Addendum)
 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

## 2024-02-03 NOTE — Assessment & Plan Note (Addendum)
 On Doxy po, probiotic Sch Pulm f/u - Dr Shelah I wonder if she should try using an oscillating vest -so discussed with Dr. Shelah Nebulizer w/NS to help with sputum. Pt declined Xopenex  [facility]N (old prescription printed)

## 2024-02-03 NOTE — Assessment & Plan Note (Signed)
 On MTX and Cimzia  monthly

## 2024-02-04 ENCOUNTER — Other Ambulatory Visit (HOSPITAL_COMMUNITY): Payer: Self-pay

## 2024-02-04 ENCOUNTER — Other Ambulatory Visit: Payer: Self-pay | Admitting: Cardiovascular Disease

## 2024-02-04 ENCOUNTER — Encounter: Payer: Self-pay | Admitting: Internal Medicine

## 2024-02-05 ENCOUNTER — Encounter: Payer: Self-pay | Admitting: Primary Care

## 2024-02-05 ENCOUNTER — Ambulatory Visit (INDEPENDENT_AMBULATORY_CARE_PROVIDER_SITE_OTHER): Admitting: Primary Care

## 2024-02-05 ENCOUNTER — Ambulatory Visit: Payer: Self-pay | Admitting: Emergency Medicine

## 2024-02-05 VITALS — BP 118/60 | HR 110 | Temp 97.8°F | Ht 61.5 in | Wt 101.4 lb

## 2024-02-05 DIAGNOSIS — J479 Bronchiectasis, uncomplicated: Secondary | ICD-10-CM

## 2024-02-05 LAB — CBC WITH DIFFERENTIAL/PLATELET
Basophils Absolute: 0 K/uL (ref 0.0–0.1)
Basophils Relative: 0.1 % (ref 0.0–3.0)
Eosinophils Absolute: 0.1 K/uL (ref 0.0–0.7)
Eosinophils Relative: 1.1 % (ref 0.0–5.0)
HCT: 39.8 % (ref 36.0–46.0)
Hemoglobin: 13.4 g/dL (ref 12.0–15.0)
Lymphocytes Relative: 9.8 % — ABNORMAL LOW (ref 12.0–46.0)
Lymphs Abs: 1 K/uL (ref 0.7–4.0)
MCHC: 33.8 g/dL (ref 30.0–36.0)
MCV: 95 fl (ref 78.0–100.0)
Monocytes Absolute: 0.9 K/uL (ref 0.1–1.0)
Monocytes Relative: 9 % (ref 3.0–12.0)
Neutro Abs: 8 K/uL — ABNORMAL HIGH (ref 1.4–7.7)
Neutrophils Relative %: 80 % — ABNORMAL HIGH (ref 43.0–77.0)
Platelets: 354 K/uL (ref 150.0–400.0)
RBC: 4.19 Mil/uL (ref 3.87–5.11)
RDW: 13.1 % (ref 11.5–15.5)
WBC: 9.9 K/uL (ref 4.0–10.5)

## 2024-02-05 NOTE — Patient Instructions (Addendum)
  VISIT SUMMARY: During your visit, we discussed your worsening respiratory symptoms following recent dental work and your ongoing management of bronchiectasis. We reviewed your intolerance to certain antibiotics and your stable rheumatoid arthritis treatment.  YOUR PLAN: -BRONCHIECTASIS WITH CHRONIC COUGH AND RECURRENT EXACERBATIONS: Bronchiectasis is a condition where the airways in your lungs become damaged, leading to chronic cough and recurrent lung infections. To manage this, we will order a CT scan of your chest, check your blood counts, and provide a sputum cup for a potential culture. You should start using hypertonic saline nebulizers every six hours and we will order a therapy vest for chest physical therapy. We are also considering Brinsepri as an additional treatment to reduce exacerbations. We will hold off on prednisone  for now since you are not experiencing wheezing.  -ANTIBIOTIC INTOLERANCE (AMOXICILLIN  AND DOXYCYCLINE ): You have an intolerance to amoxicillin  and doxycycline , which cause diarrhea. Given your limited antibiotic options, we discussed using Brinsepri, which is a non-antibiotic option that targets inflammation with minimal side effects.  -RHEUMATOID ARTHRITIS, STABLE ON METHOTREXATE  AND CIMZIA : Your rheumatoid arthritis is well-managed with methotrexate  and Cimzia . However, your rheumatologist advised holding these medications until we review your CT scan and lab results. We will resume these medications after ensuring it is safe to do so.  INSTRUCTIONS: Please follow up after your CT scan and lab results are available to review your treatment plan. Continue using your handheld nebulizer with hypertonic saline as prescribed and use the therapy vest for chest physical therapy once it is available.  Orders: Respiratory sputum culture (ordered)  Labs today (ordered) Smart vest re: bronchiectasis High resolution CT chest re: bronchiectasis (ordered)  Follow-up 3-4 weeks  with Dr. Shelah or Landry NP - after CT chest
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
## 2024-02-05 NOTE — Telephone Encounter (Signed)
 Appointment today 02/05/2024 at 10am with Monica Ferrari NP   FYI Only or Action Required?: FYI only for provider.  Patient is followed in Pulmonology for bronchiectasis, last seen on 02/27/2023 by Monica Lamar RAMAN, MD.  Called Nurse Triage reporting Shortness of Breath.  Symptoms began about a month ago.  Interventions attempted: OTC medications: Tylenol , Delsym  and Prescription medications: Doxycycline --had to stop yesterday due to diarrhea.  Symptoms are: unchanged.  Triage Disposition: See HCP Within 4 Hours (Or PCP Triage)  Patient/caregiver understands and will follow disposition?: Yes            Copied from CRM #[id number]. Topic: Clinical - Red Word Triage >> Feb 05, 2024  8:58 AM Monica [name] wrote: Red Word that prompted transfer to Nurse Triage: SOB  pt has PNA and very fatigue. Reason for Disposition  [1] Longstanding difficulty breathing (e.g., CHF, COPD, emphysema) AND [2] WORSE than normal  Answer Assessment - Initial Assessment Questions Dental work done at beginning of last month Monica [name] and was prescribed doxycycline  Went to her PCP 1.5 weeks ago and chest Xray showed pneumonia Started doxycycline  and had diarrhea--held off and went back to PCP & PCP wanted her to follow up with Pulmonology Patient stopped taking doxycycline  due to diarrhea--able to take 9 doses (4.5 days worth)--she stopped these yesterday after morning dose.  Fatigue, Headache, nausea, shortness of breath, cough--pink off and on for a while--told dr byrum in the past Patient is advised that if anything worsens to go to the Emergency Room. Patient verbalized understanding.   [facility] Triage - Initial Assessment Questions Chief Complaint (e.g., cough, sob, wheezing, fever, chills, sweat or additional symptoms) *Go to specific symptom protocol after initial questions.   How long have symptoms been present? 1 month  Have you tested for COVID or Flu? Note: If not, ask  patient if a home test can be taken. If so, instruct patient to call back for positive results. Yes  negative for covid  MEDICINES:   Have you used any OTC meds to help with symptoms? Yes If yes, ask What medications? Tylenol  Delsym   Have you used your inhalers/maintenance medication? Yes If yes, What medications? Uses a flutter device    OXYGEN: Do you wear supplemental oxygen? No If yes, How many liters are you supposed to use? N/A  Do you monitor your oxygen levels? Yes If yes, What is your reading (oxygen level) today? Between 95-98%  What is your usual oxygen saturation reading?  (Note: Pulmonary O2 sats should be 90% or greater) 97-98%     3. PATTERN Does the difficult breathing come and go, or has it been constant since it started?      ---- 4. SEVERITY: How bad is your breathing? (e.g., mild, moderate, severe)      Shortness of breath 5. RECURRENT SYMPTOM: Have you had difficulty breathing before? If Yes, ask: When was the last time? and What happened that time?      ----- 6. CARDIAC HISTORY: Do you have any history of heart disease? (e.g., heart attack, angina, bypass surgery, angioplasty)      Afib history 7. LUNG HISTORY: Do you have any history of lung disease?  (e.g., pulmonary embolus, asthma, emphysema)     bronchiectasis 8. CAUSE: What do you think is causing the breathing problem?      Dx w/pneumonia 9. OTHER SYMPTOMS: Do you have any other symptoms? (e.g., chest pain, cough, dizziness, fever, runny nose)     Fatigue, Headache, nausea, shortness  of breath, cough--pink off and on for a while--told dr byrum in the past  Protocols used: Breathing Difficulty-A-AH

## 2024-02-05 NOTE — Telephone Encounter (Signed)
 Noted, saw Landry Ferrari NP at 10 am.  Nothing further needed.

## 2024-02-06 ENCOUNTER — Other Ambulatory Visit: Payer: Self-pay

## 2024-02-06 ENCOUNTER — Other Ambulatory Visit (HOSPITAL_COMMUNITY): Payer: Self-pay

## 2024-02-06 MED ORDER — SODIUM CHLORIDE 3 % IN NEBU
INHALATION_SOLUTION | RESPIRATORY_TRACT | 2 refills | Status: DC | PRN
  Filled 2024-02-06: qty 90, 7d supply, fill #0
  Filled 2024-02-06 – 2024-02-09 (×2): qty 240, 30d supply, fill #0

## 2024-02-06 NOTE — Telephone Encounter (Signed)
**Note De-identified  [place] Obfuscation** Please advise 

## 2024-02-06 NOTE — Telephone Encounter (Signed)
 Spoke with patient and sent in RX to [facility]

## 2024-02-09 ENCOUNTER — Other Ambulatory Visit (HOSPITAL_COMMUNITY): Payer: Self-pay

## 2024-02-09 ENCOUNTER — Other Ambulatory Visit: Payer: Self-pay

## 2024-02-10 ENCOUNTER — Inpatient Hospital Stay: Admission: RE | Admit: 2024-02-10 | Discharge: 2024-02-10 | Attending: Primary Care

## 2024-02-10 DIAGNOSIS — J479 Bronchiectasis, uncomplicated: Secondary | ICD-10-CM | POA: Diagnosis not present

## 2024-02-10 NOTE — Telephone Encounter (Signed)
 [facility] can we check on this order?

## 2024-02-10 NOTE — Progress Notes (Unsigned)
 Cardiology Office Note:  .   Date:  02/10/2024  ID:  Monica [name], DOB 25-Jul-1945, MRN [id number] PCP: Garald Karlynn GAILS, MD  [facility] Providers Cardiologist:  Jerel Balding, MD Electrophysiologist:  OLE ONEIDA HOLTS, MD {  History of Present Illness: .   Monica [name] is a 78 y.o. female w/PMHx of  RA, bronchiectasis PACs, PVCs AFib  She saw Dr. Balding 01/10/23, increased palpitations despite her BB. TTE in 2023 showed normal left ventricular systolic function and normal size left atrium, no significant valve problems.  She had a calcium  score of 0 on the preablation CT in May 2024.   Palpitations were bothersome > pending visit with Dr. HOLTS.  Saw Dr. HOLTS 02/07/23, AFib burden much improved post ablation from  multiple a week lasting hours > only 2 brief episodes longest ~ . Her metoprolol  dose reduced. A/c continued  Of late, seeing pulm for a recent PNA and worsened cough, management of her bronchiectasis  Today's visit is scheduled as an annual visit ROS:   She has not had any further symptoms of AFib She will occasionally have an awareness of a skipped/extra beat, she mentions her hx of PACs/PVCs. She has both a Optician, dispensing and now an Apple watch > neither have alerted her to Afib.  She is struggling to clear cough working with her PMD and pulm teams > on doxy pulm gave her saline nebulizer and is pending deliver/receipts of a vest for chest PT She lines up timing after having work done for a dental crown.  She gets soreness in her chest after coughing a lt, no CP otherwise No near syncope or syncope No bleeding or signs of bleeding   Arrhythmia/AAD hx AFib AFib ablation 10/10/22 No AAD hx  Studies Reviewed: SABRA    EKG not done today 01/29/24: personally reviewed SR 89bpm  10/10/2022: EPS/ablation CONCLUSIONS: 1. Successful PVI 2. Successful ablation/isolation of the posterior wall 3. Intracardiac echo reveals trivial pericardial  effusion, normal left atrial architecture 4. No early apparent complications. 5.  Colchicine  0.6 mg by mouth twice daily for 5 days 6.  Protonix  40 mg by mouth once daily for 45 days     Echocardiogram 01/29/2022  1. Left ventricular ejection fraction, by estimation, is 60 to 65%. The  left ventricle has normal function. The left ventricle has no regional  wall motion abnormalities. Left ventricular diastolic parameters are  consistent with Grade I diastolic  dysfunction (impaired relaxation).   2. Right ventricular systolic function is normal. The right ventricular  size is normal.   3. The mitral valve is normal in structure. No evidence of mitral valve  regurgitation. No evidence of mitral stenosis.   4. The aortic valve is normal in structure. Aortic valve regurgitation is  trivial. No aortic stenosis is present.   5. The inferior vena cava is normal in size with greater than 50%  respiratory variability, suggesting right atrial pressure of 3 mmHg.   Risk Assessment/Calculations:    Physical Exam:   VS:  There were no vitals taken for this visit.   Wt Readings from Last 3 Encounters:  02/05/24 101 lb 6.4 oz (46 kg)  02/03/24 101 lb 3.2 oz (45.9 kg)  01/29/24 102 lb (46.3 kg)    GEN: Well nourished, well developed in no acute distress NECK: No JVD; No carotid bruits CARDIAC: RRR, no murmurs, rubs, gallops RESPIRATORY:  CTA b/l without rales, wheezing or rhonchi  ABDOMEN: Soft, non-tender, non-distended EXTREMITIES: No edema;  No deformity   ASSESSMENT AND PLAN: .    paroxysmal AFib CHA2DS2Vasc is 3, on Eliquis , appropriately dosed no burden by symptoms Hx of PACs and PVCs as well   We discussed Dr. Hiram pending departure.  She would like to see Daril in June (her 2 year mark post ablation) and plan any if needed EP follow up at that time. Assured her the full EP team remains available to her She will c/w Dr. JAYSON   Secondary hypercoagulable state 2/2  AFib   Dispo: as above, sooner if needed  Signed, Charlies Macario Arthur, PA-C

## 2024-02-11 ENCOUNTER — Other Ambulatory Visit

## 2024-02-12 ENCOUNTER — Encounter: Payer: Self-pay | Admitting: Physician Assistant

## 2024-02-12 ENCOUNTER — Ambulatory Visit: Attending: Physician Assistant | Admitting: Physician Assistant

## 2024-02-12 VITALS — BP 118/64 | HR 96 | Ht 61.5 in | Wt 101.6 lb

## 2024-02-12 DIAGNOSIS — I480 Paroxysmal atrial fibrillation: Secondary | ICD-10-CM | POA: Diagnosis not present

## 2024-02-12 DIAGNOSIS — D6869 Other thrombophilia: Secondary | ICD-10-CM | POA: Diagnosis not present

## 2024-02-12 DIAGNOSIS — I493 Ventricular premature depolarization: Secondary | ICD-10-CM | POA: Diagnosis not present

## 2024-02-12 DIAGNOSIS — I491 Atrial premature depolarization: Secondary | ICD-10-CM | POA: Insufficient documentation

## 2024-02-12 NOTE — Patient Instructions (Signed)
 Medication Instructions:  Your physician recommends that you continue on your current medications as directed. Please refer to the Current Medication list given to you today.  *If you need a refill on your cardiac medications before your next appointment, please call your pharmacy*  Lab Work: None ordered If you have labs (blood work) drawn today and your tests are completely normal, you will receive your results only by: MyChart Message (if you have MyChart) OR A paper copy in the mail If you have any lab test that is abnormal or we need to change your treatment, we will call you to review the results.  Follow-Up: At [facility], you and your health needs are our priority.  As part of our continuing mission to provide you with exceptional heart care, our providers are all part of one team.  This team includes your primary Cardiologist (physician) and Advanced Practice Providers or APPs (Physician Assistants and Nurse Practitioners) who all work together to provide you with the care you need, when you need it.  Your next appointment:   June 2026  Provider:   You will follow up with Clint R. Fenton, PA-C in the Atrial Fibrillation Clinic located at [facility].

## 2024-02-13 ENCOUNTER — Ambulatory Visit: Payer: Self-pay | Admitting: Primary Care

## 2024-02-13 NOTE — Telephone Encounter (Signed)
Yes you can do that

## 2024-02-13 NOTE — Telephone Encounter (Signed)
 Copied from CRM #[id number]. Topic: Clinical - Prescription Issue >> Feb 09, 2024  1:09 PM Rozanna MATSU wrote: Reason for CRM: Chiquita with [facility] and stated the meds for Rx #: [id number]  sodium chloride  HYPERTONIC 3 % nebulizer solution [[id number]]   No longer comes in but and box can not be broken down and will new prescription if this want the provider is wanting.  Landry is it OK to resend a new rx?

## 2024-02-16 ENCOUNTER — Encounter: Payer: Self-pay | Admitting: Cardiovascular Disease

## 2024-02-16 ENCOUNTER — Other Ambulatory Visit (HOSPITAL_COMMUNITY): Payer: Self-pay

## 2024-02-16 MED ORDER — SODIUM CHLORIDE 3 % IN NEBU
INHALATION_SOLUTION | RESPIRATORY_TRACT | 1 refills | Status: AC | PRN
  Filled 2024-02-16: qty 720, 60d supply, fill #0
  Filled 2024-02-20: qty 240, 30d supply, fill #0

## 2024-02-16 NOTE — Telephone Encounter (Signed)
 Rx for hypertonic saline # 750 ml sent to [facility] pharmacy.

## 2024-02-16 NOTE — Addendum Note (Signed)
 Addended by: Hershell Brandl M on: 02/16/2024 03:02 PM   Modules accepted: Orders

## 2024-02-17 NOTE — Progress Notes (Signed)
 Called and spoke to pt - advised of CT results per Landry Ferrari. Pt verbalized understanding, NFN

## 2024-02-18 ENCOUNTER — Other Ambulatory Visit (HOSPITAL_COMMUNITY): Payer: Self-pay

## 2024-02-18 DIAGNOSIS — Z79899 Other long term (current) drug therapy: Secondary | ICD-10-CM | POA: Diagnosis not present

## 2024-02-18 DIAGNOSIS — M0579 Rheumatoid arthritis with rheumatoid factor of multiple sites without organ or systems involvement: Secondary | ICD-10-CM | POA: Diagnosis not present

## 2024-02-18 MED ORDER — TRAMADOL HCL 50 MG PO TABS
50.0000 mg | ORAL_TABLET | ORAL | 0 refills | Status: AC | PRN
  Filled 2024-02-18: qty 15, 3d supply, fill #0

## 2024-02-20 ENCOUNTER — Other Ambulatory Visit (HOSPITAL_COMMUNITY): Payer: Self-pay

## 2024-02-20 ENCOUNTER — Other Ambulatory Visit: Payer: Self-pay

## 2024-03-01 DIAGNOSIS — H903 Sensorineural hearing loss, bilateral: Secondary | ICD-10-CM | POA: Diagnosis not present

## 2024-03-01 DIAGNOSIS — H6121 Impacted cerumen, right ear: Secondary | ICD-10-CM | POA: Diagnosis not present

## 2024-03-01 DIAGNOSIS — Z9622 Myringotomy tube(s) status: Secondary | ICD-10-CM | POA: Diagnosis not present

## 2024-03-04 ENCOUNTER — Ambulatory Visit (INDEPENDENT_AMBULATORY_CARE_PROVIDER_SITE_OTHER): Admitting: Emergency Medicine

## 2024-03-04 ENCOUNTER — Encounter: Payer: Self-pay | Admitting: Emergency Medicine

## 2024-03-04 VITALS — BP 116/73 | HR 97 | Temp 98.1°F | Ht 61.5 in | Wt 101.8 lb

## 2024-03-04 DIAGNOSIS — J479 Bronchiectasis, uncomplicated: Secondary | ICD-10-CM

## 2024-03-04 NOTE — Progress Notes (Signed)
 Subjective:    Patient ID: Monica [name], female    DOB: Apr 21, 1946, 78 y.o.   MRN: [id number]  HPI   ROV 03/04/2024 --Ms. Caraway is 17 with a history of atrial fibrillation post ablation, rheumatoid arthritis on immunosuppression (methotrexate  and Cimzia ), bronchiectasis and chronic upper airway irritation syndrome with cough.  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  She had flaring symptoms with dyspnea and cough in September and was treated with doxycycline . She has been on a flutter valve for years, is now starting chest vest - she does feel better with it. She has also started the 3% nebs. Her breathing seems to be a bit better with the chest vest. She is on flonase  prn, loratadine  qd. She has not been able to make a sputum sample.   High-resolution CT scan of the chest done 02/10/2024 reviewed by me, shows no mediastinal or hilar adenopathy, evidence for esophageal dysmotility, biapical pleural-parenchymal scar with extensive cylindrical bronchiectasis and parabronchial vascular nodularity similar to 02/2023                                                                                                                                                                       Review of Systems As per history of present illness     Objective:   Physical Exam Vitals:   03/04/24 1453  BP: 116/73  Pulse: 97  Temp: 98.1 F (36.7 C)  TempSrc: Oral  SpO2: 98%  Weight: 101 lb 12.8 oz (46.2 kg)  Height: 5' 1.5 (1.562 m)    'Gen: Pleasant, thin woman, well-appearing, in no distress,  normal affect  ENT: No lesions,  mouth clear,  oropharynx clear, no postnasal drip  Neck: No JVD, no stridor  Lungs: No use of accessory muscles, clear without rales or rhonchi, no wheeze   Cardiovascular: RRR, heart sounds normal, no murmur or gallops, no peripheral edema  Musculoskeletal: No deformities, no  cyanosis or clubbing  Neuro: alert, non focal  Skin: Warm, no lesions or rashes        Assessment & Plan:  Bronchiectasis without acute exacerbation (HCC) She has intrusive baseline symptoms, maybe a bit better with the chest vest (which is new).  Her CT chest is overall stable which is reassuring.  We discussed this today.  Unfortunately even if we establish that she is colonized with Mycobacterium avium she would be very difficult to treat due to her multiple drug allergies and antibiotic sensitivities.  Agree with continuing your chest vest precautions twice a day. Try to do your 3% saline nebulizer treatments once daily. You can continue your flutter valve treatments as needed through the day to help clear mucus  We may decide to consider nebulized bronchodilator therapy to help with mucus clearance at some point in the future. We may also decide to consider Brinsupri at some point to help decrease inflammation in your airways We discussed possible bronchoscopy today but acknowledged that if we were to establish that you are colonized with Mycobacterium avium that it would be very difficult to treat given your sensitivities to antibiotics. Follow in our office in 3 months so we can see how you are doing on this routine and decide next steps.   I personally spent a total of 35 minutes in the care of the patient today including preparing to see the patient, getting/reviewing separately obtained history, performing a medically appropriate exam/evaluation, counseling and educating, documenting clinical information in the EHR, independently interpreting results, and communicating results.   Lamar Chris, MD, PhD 03/04/2024, 3:13 PM [facility] [phone] or if no answer [phone]

## 2024-03-04 NOTE — Assessment & Plan Note (Signed)
 She has intrusive baseline symptoms, maybe a bit better with the chest vest (which is new).  Her CT chest is overall stable which is reassuring.  We discussed this today.  Unfortunately even if we establish that she is colonized with Mycobacterium avium she would be very difficult to treat due to her multiple drug allergies and antibiotic sensitivities.  Agree with continuing your chest vest precautions twice a day. Try to do your 3% saline nebulizer treatments once daily. You can continue your flutter valve treatments as needed through the day to help clear mucus We may decide to consider nebulized bronchodilator therapy to help with mucus clearance at some point in the future. We may also decide to consider Brinsupri at some point to help decrease inflammation in your airways We discussed possible bronchoscopy today but acknowledged that if we were to establish that you are colonized with Mycobacterium avium that it would be very difficult to treat given your sensitivities to antibiotics. Follow in our office in 3 months so we can see how you are doing on this routine and decide next steps.

## 2024-03-04 NOTE — Patient Instructions (Addendum)
 Agree with continuing your chest vest precautions twice a day. Try to do your 3% saline nebulizer treatments once daily. You can continue your flutter valve treatments as needed through the day to help clear mucus We may decide to consider nebulized bronchodilator therapy to help with mucus clearance at some point in the future. We may also decide to consider Brinsupri at some point to help decrease inflammation in your airways We discussed possible bronchoscopy today but acknowledged that if we were to establish that you are colonized with Mycobacterium avium that it would be very difficult to treat given your sensitivities to antibiotics. Follow in our office in 3 months so we can see how you are doing on this routine and decide next steps.

## 2024-03-05 ENCOUNTER — Other Ambulatory Visit (HOSPITAL_BASED_OUTPATIENT_CLINIC_OR_DEPARTMENT_OTHER): Payer: Self-pay

## 2024-03-05 DIAGNOSIS — Z23 Encounter for immunization: Secondary | ICD-10-CM | POA: Diagnosis not present

## 2024-03-05 MED ORDER — COMIRNATY 30 MCG/0.3ML IM SUSY
0.3000 mL | PREFILLED_SYRINGE | Freq: Once | INTRAMUSCULAR | 0 refills | Status: AC
Start: 2024-03-05 — End: 2024-03-06
  Filled 2024-03-05: qty 0.3, 1d supply, fill #0

## 2024-03-09 DIAGNOSIS — Z79899 Other long term (current) drug therapy: Secondary | ICD-10-CM | POA: Diagnosis not present

## 2024-03-09 DIAGNOSIS — M0579 Rheumatoid arthritis with rheumatoid factor of multiple sites without organ or systems involvement: Secondary | ICD-10-CM | POA: Diagnosis not present

## 2024-03-09 DIAGNOSIS — M1991 Primary osteoarthritis, unspecified site: Secondary | ICD-10-CM | POA: Diagnosis not present

## 2024-03-09 DIAGNOSIS — Z681 Body mass index (BMI) 19 or less, adult: Secondary | ICD-10-CM | POA: Diagnosis not present

## 2024-03-10 ENCOUNTER — Encounter: Payer: Self-pay | Admitting: Cardiovascular Disease

## 2024-03-10 ENCOUNTER — Ambulatory Visit: Attending: Cardiovascular Disease | Admitting: Cardiovascular Disease

## 2024-03-10 VITALS — BP 110/62 | HR 95 | Ht 61.5 in | Wt 101.2 lb

## 2024-03-10 DIAGNOSIS — I493 Ventricular premature depolarization: Secondary | ICD-10-CM | POA: Insufficient documentation

## 2024-03-10 DIAGNOSIS — M0579 Rheumatoid arthritis with rheumatoid factor of multiple sites without organ or systems involvement: Secondary | ICD-10-CM | POA: Diagnosis not present

## 2024-03-10 DIAGNOSIS — D6869 Other thrombophilia: Secondary | ICD-10-CM | POA: Diagnosis not present

## 2024-03-10 DIAGNOSIS — Z8679 Personal history of other diseases of the circulatory system: Secondary | ICD-10-CM | POA: Insufficient documentation

## 2024-03-10 DIAGNOSIS — Z9889 Other specified postprocedural states: Secondary | ICD-10-CM | POA: Insufficient documentation

## 2024-03-10 MED ORDER — METOPROLOL SUCCINATE ER 25 MG PO TB24: 25.0000 mg | ORAL_TABLET | Freq: Two times a day (BID) | ORAL | Status: AC

## 2024-03-10 NOTE — Patient Instructions (Addendum)
 Medication Instructions:  Decrease metoprolol  succinate to 25 mg twice daily *If you need a refill on your cardiac medications before your next appointment, please call your pharmacy*  Lab Work: None ordered If you have labs (blood work) drawn today and your tests are completely normal, you will receive your results only by: MyChart Message (if you have MyChart) OR A paper copy in the mail If you have any lab test that is abnormal or we need to change your treatment, we will call you to review the results.  Testing/Procedures: None ordered  Follow-Up: At [facility], you and your health needs are our priority.  As part of our continuing mission to provide you with exceptional heart care, our providers are all part of one team.  This team includes your primary Cardiologist (physician) and Advanced Practice Providers or APPs (Physician Assistants and Nurse Practitioners) who all work together to provide you with the care you need, when you need it.  Your next appointment:   1 year(s)  Provider:   Jerel Balding, MD    We recommend signing up for the patient portal called MyChart.  Sign up information is provided on this After Visit Summary.  MyChart is used to connect with patients for Virtual Visits (Telemedicine).  Patients are able to view lab/test results, encounter notes, upcoming appointments, etc.  Non-urgent messages can be sent to your provider as well.   To learn more about what you can do with MyChart, go to [email or web address].

## 2024-03-10 NOTE — Progress Notes (Signed)
 Cardiology office note   Date:  03/10/2024   ID:  Monica [name], DOB 1945-11-01, MRN [id number]   PCP:  Garald Karlynn GAILS, MD  Cardiologist:  Jerel Balding, MD  Electrophysiologist:  OLE ONEIDA HOLTS, MD   Evaluation Performed:  Follow-Up Visit  Chief Complaint: Atrial fibrillation  History of Present Illness:    Monica [name] is a 78 y.o. female with palpitations related to episodes of paroxysmal atrial fibrillation as well as PACs and PVCs, without significant underlying structural heart disease.  She has had an excellent response following radiofrequency ablation with pulmonary vein isolation in June 2024.  She has not been troubled by any sustained palpitations once she was beyond the first 3 postablation months.  She has occasional very brief isolated skipped beats.  She feels well.  She is recovering from recent COVID-19 infection.  This was not associated with any arrhythmia symptoms.  She remains on Eliquis  anticoagulation, without bleeding problems.  She continues to take metoprolol  succinate 25 mg twice daily and would like to see if she can decrease this medication, which has caused fatigue.  She has an excellent lipid profile on statins with an LDL of 71 and HDL of 51.  The echocardiogram in September 2023 showed normal left ventricular systolic function and normal size left atrium, no significant valve problems.  She had a calcium  score of 0 on the preablation CT in May 2024.  Rheumatoid arthritis symptoms are very well-controlled on methotrexate  and Cimzia  (rheumatologist is Dr. Helena at the [facility] office).  Other comorbid conditions include bronchiectasis, gastroesophageal reflux disease, irritable bowel syndrome, osteoporosis, chronic sinusitis and mild anxiety disorder.    Past Medical History:  Diagnosis Date   Adenomatous colon polyp 10/2003   Allergic rhinitis    Allergy     SEASONAL   Bronchiectasis    Chronic sinusitis    Diverticulosis     GERD (gastroesophageal reflux disease)    Hearing loss    Hemorrhoids    Insomnia    Menopausal disorder    Osteoporosis    Palpitations    Paroxysmal A-fib (HCC)    Rheumatoid arthritis (HCC)    TMJ syndrome    Urticaria    Past Surgical History:  Procedure Laterality Date   ATRIAL FIBRILLATION ABLATION N/A 10/10/2022   Procedure: ATRIAL FIBRILLATION ABLATION;  Surgeon: Holts Ole ONEIDA, MD;  Location: [facility] INVASIVE CV LAB;  Service: Cardiovascular;  Laterality: N/A;   COLONOSCOPY     NASAL SINUS SURGERY     TYMPANOSTOMY     VIDEO BRONCHOSCOPY Bilateral 07/16/2016   Procedure: VIDEO BRONCHOSCOPY WITH FLUORO;  Surgeon: Lamar GORMAN Chris, MD;  Location: [facility] ENDOSCOPY;  Service: Cardiopulmonary;  Laterality: Bilateral;     Current Meds  Medication Sig   acetaminophen  (TYLENOL ) 500 MG tablet Take 500 mg by mouth every 6 (six) hours as needed for moderate pain or headache.   Butalbital -Acetaminophen  50-300 MG TABS Take 1 tablet by mouth 4 (four) times daily as needed for bad headaches.   Calcium -Magnesium -Zinc (CAL-MAG-ZINC PO) Take 1 tablet by mouth daily.   certolizumab pegol  (CIMZIA ) 2 X 200 MG KIT Per Rheumatology   Cholecalciferol (VITAMIN D ) 1000 UNITS capsule Take 1,000 Units by mouth daily with lunch.   dextromethorphan  (DELSYM ) 30 MG/5ML liquid Take 30 mg by mouth at bedtime as needed for cough.    ELIQUIS  5 MG TABS tablet TAKE 1 TABLET TWICE A DAY   Estradiol 10 MCG TABS Place 10 mcg vaginally 3 (  three) times a week.   folic acid (FOLVITE) 1 MG tablet Take 2 mg by mouth daily with lunch.   guaiFENesin  (MUCINEX ) 600 MG 12 hr tablet Take 600 mg by mouth 2 (two) times daily as needed (congestion/cough). (Patient taking differently: Take 600 mg by mouth 2 (two) times daily as needed (congestion/cough). Pt stats takes daily)   Lactobacillus-Inulin (CULTURELLE DIGESTIVE HEALTH PO) Take 1 capsule by mouth in the morning.   loratadine  (CLARITIN ) 10 MG tablet Take 10 mg by mouth in the  morning.   methotrexate  (RHEUMATREX) 2.5 MG tablet Take 10 mg by mouth every Sunday. 6 tablets   Multiple Vitamins-Minerals (ONE A DAY WOMEN 50 PLUS) CHEW Chew 1 tablet by mouth daily.   Respiratory Therapy Supplies (FLUTTER) DEVI Twice a day and prn as needed, may increase if feeling worse   traMADol  (ULTRAM ) 50 MG tablet Take 1 tablet (50 mg total) by mouth every 4-6 hours as needed for pain   [DISCONTINUED] metoprolol  succinate (TOPROL -XL) 25 MG 24 hr tablet TAKE ONE AND ONE-HALF TABLETS IN THE MORNING AND AT BEDTIME (DOSE CHANGE, DISCONTINUE 50 MG)     Allergies:   Amoxicillin , Azithromycin , Colchicine , Moxifloxacin , Nitrofurantoin , Sulfur, Albuterol , Amoxicillin -pot clavulanate, Avelox  [moxifloxacin  hcl in nacl], Benzonatate, Ciprofloxacin , Macrobid  [nitrofurantoin  macrocrystal], and Septra [sulfamethoxazole-trimethoprim]      Family Hx: The patient's family history includes AVM in her brother; Cancer in her maternal grandmother, mother, and paternal uncle; Colon cancer (age of onset: 81) in her mother; Heart disease in her father and maternal grandfather; Hypertension in her father; Rectal cancer in her mother; Seizures in her mother. There is no history of Esophageal cancer.  ROS:   Please see the history of present illness.     All other systems reviewed and are negative.   Prior CV studies:   The following studies were reviewed today:  Echocardiogram 01/29/2022     1. Left ventricular ejection fraction, by estimation, is 60 to 65%. The  left ventricle has normal function. The left ventricle has no regional  wall motion abnormalities. Left ventricular diastolic parameters are  consistent with Grade I diastolic  dysfunction (impaired relaxation).   2. Right ventricular systolic function is normal. The right ventricular  size is normal.   3. The mitral valve is normal in structure. No evidence of mitral valve  regurgitation. No evidence of mitral stenosis.   4. The aortic  valve is normal in structure. Aortic valve regurgitation is  trivial. No aortic stenosis is present.   5. The inferior vena cava is normal in size with greater than 50%  respiratory variability, suggesting right atrial pressure of 3 mmHg.   Labs/Other Tests and Data Reviewed:    EKG: Personally reviewed ECG tracing from 01/29/2024, showing normal sinus rhythm and questionable left atrial abnormality, otherwise normal tracing  EKG Interpretation Date/Time:    Ventricular Rate:    PR Interval:    QRS Duration:    QT Interval:    QTC Calculation:   R Axis:      Text Interpretation:           Recent Labs: 01/29/2024: ALT 16; BUN 19; Creatinine, Ser 0.67; Magnesium  2.1; Potassium 4.1; Sodium 134; TSH 4.83 02/05/2024: Hemoglobin 13.4; Platelets 354.0   Recent Lipid Panel Lab Results  Component Value Date/Time   CHOL 141 08/12/2023 10:42 AM   TRIG 88.0 08/12/2023 10:42 AM   HDL 51.80 08/12/2023 10:42 AM   CHOLHDL 3 08/12/2023 10:42 AM   LDLCALC 71 08/12/2023 10:42  AM    Wt Readings from Last 3 Encounters:  03/10/24 101 lb 3.2 oz (45.9 kg)  03/04/24 101 lb 12.8 oz (46.2 kg)  02/12/24 101 lb 9.6 oz (46.1 kg)     Objective:    Vital Signs:  BP 110/62 (BP Location: Left Arm, Patient Position: Sitting, Cuff Size: Normal)   Pulse 95   Ht 5' 1.5 (1.562 m)   Wt 101 lb 3.2 oz (45.9 kg)   SpO2 98%   BMI 18.81 kg/m      General: Alert, oriented x3, no distress, very lean, petite habitus Head: no evidence of trauma, PERRL, EOMI, no exophtalmos or lid lag, no myxedema, no xanthelasma; normal ears, nose and oropharynx Neck: normal jugular venous pulsations and no hepatojugular reflux; brisk carotid pulses without delay and no carotid bruits Chest: clear to auscultation, no signs of consolidation by percussion or palpation, normal fremitus, symmetrical and full respiratory excursions Cardiovascular: normal position and quality of the apical impulse, regular rhythm, normal first  and second heart sounds, no murmurs, rubs or gallops Abdomen: no tenderness or distention, no masses by palpation, no abnormal pulsatility or arterial bruits, normal bowel sounds, no hepatosplenomegaly Extremities: no clubbing, cyanosis or edema; 2+ radial, ulnar and brachial pulses bilaterally; 2+ right femoral, posterior tibial and dorsalis pedis pulses; 2+ left femoral, posterior tibial and dorsalis pedis pulses; no subclavian or femoral bruits Neurological: grossly nonfocal Psych: Normal mood and affect     ASSESSMENT & PLAN:    1. Status post ablation of atrial fibrillation   2. PVCs (premature ventricular contractions)   3. Acquired thrombophilia   4. 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  She had radiofrequency ablation in June 2024.  If she has recurrent arrhythmia she might benefit from the new electrical gradient technology. PVCs: These are not currently bothering her much.  Isolated PVCs preceded her diagnosis of atrial fibrillation and she will continue beta-blockers, but we will try to decrease the dose of metoprolol  to 25 mg twice daily. Anticoagulation: No bleeding complications.  In about a year's time when she turns 78 years old we should decrease the dose of Eliquis  to 2.5 mg twice daily. RA: Asymptomatic, currently on methotrexate  and Cimzia .  She would like to discontinue the methotrexate  but she will discuss this with Dr. Helena.  Patient Instructions  Medication Instructions:  Decrease metoprolol  succinate to 25 mg twice daily *If you need a refill on your cardiac medications before your next appointment, please call your pharmacy*  Lab Work: None ordered If you have labs (blood work) drawn today and your tests are completely normal, you will receive your results only by: MyChart Message (if you have MyChart) OR A paper copy in the mail If you have any lab test that is abnormal or we need  to change your treatment, we will call you to review the results.  Testing/Procedures: None ordered  Follow-Up: At [facility], you and your health needs are our priority.  As part of our continuing mission to provide you with exceptional heart care, our providers are all part of one team.  This team includes your primary Cardiologist (physician) and Advanced Practice Providers or APPs (Physician Assistants and Nurse Practitioners) who all work together to provide you with the care you need, when you need it.  Your next appointment:   1 year(s)  Provider:   Jerel Balding, MD    We recommend signing  up for the patient portal called MyChart.  Sign up information is provided on this After Visit Summary.  MyChart is used to connect with patients for Virtual Visits (Telemedicine).  Patients are able to view lab/test results, encounter notes, upcoming appointments, etc.  Non-urgent messages can be sent to your provider as well.   To learn more about what you can do with MyChart, go to [email or web address].      Signed, Jerel Balding, MD  03/10/2024 5:09 PM    [facility]

## 2024-03-17 DIAGNOSIS — M0579 Rheumatoid arthritis with rheumatoid factor of multiple sites without organ or systems involvement: Secondary | ICD-10-CM | POA: Diagnosis not present

## 2024-03-22 ENCOUNTER — Other Ambulatory Visit (HOSPITAL_BASED_OUTPATIENT_CLINIC_OR_DEPARTMENT_OTHER): Payer: Self-pay

## 2024-03-22 DIAGNOSIS — Z23 Encounter for immunization: Secondary | ICD-10-CM | POA: Diagnosis not present

## 2024-03-22 MED ORDER — FLUZONE HIGH-DOSE 0.5 ML IM SUSY
0.5000 mL | PREFILLED_SYRINGE | Freq: Once | INTRAMUSCULAR | 0 refills | Status: AC
  Filled 2024-03-22: qty 0.5, 1d supply, fill #0

## 2024-03-26 DIAGNOSIS — H903 Sensorineural hearing loss, bilateral: Secondary | ICD-10-CM | POA: Diagnosis not present

## 2024-04-14 DIAGNOSIS — M0579 Rheumatoid arthritis with rheumatoid factor of multiple sites without organ or systems involvement: Secondary | ICD-10-CM | POA: Diagnosis not present

## 2024-04-15 ENCOUNTER — Encounter: Payer: Self-pay | Admitting: Cardiovascular Disease

## 2024-04-15 ENCOUNTER — Other Ambulatory Visit (HOSPITAL_COMMUNITY): Payer: Self-pay

## 2024-04-15 MED ORDER — DOXYCYCLINE HYCLATE 50 MG PO CAPS
50.0000 mg | ORAL_CAPSULE | Freq: Every day | ORAL | 1 refills | Status: AC
  Filled 2024-04-15: qty 10, 10d supply, fill #0

## 2024-04-27 ENCOUNTER — Telehealth: Payer: Self-pay

## 2024-04-27 NOTE — Telephone Encounter (Signed)
 Paper Work Dropped Off:   Patient dropped off on 5th fl [place]   Date:  04-27-24  Location of paper:  Envelope was placed in Dr [employer] on 5th floor  04-27-24 VB

## 2024-04-27 NOTE — Telephone Encounter (Signed)
"  ° °  Pre-operative Risk Assessment    Patient Name: Monica [name]  DOB: [birth year] MRN: [id number]   Date of last office visit: 03/10/24 Date of next office visit: Not scheduled   Request for Surgical Clearance    Procedure:  Apicoectomy  Date of Surgery:  Clearance 05/19/24                                Surgeon:  Franky Relic DDS Surgeon's Group or Practice Name:  [facility] Phone number:  [phone] Fax number:  [phone]   Type of Clearance Requested:   - Medical  - Pharmacy:  Hold Apixaban  (Eliquis ) x2 days prior   Type of Anesthesia:  Not Indicated   Additional requests/questions:    [name]   04/27/2024, 3:03 PM   "

## 2024-04-27 NOTE — Telephone Encounter (Signed)
 Paper Work Dropped Off:  Patient dropped of envelope on 5th fl [place]  Date:  04-27-24  Location of paper:  Envelope placed in Dr [employer] on 5th flo

## 2024-04-27 NOTE — Telephone Encounter (Signed)
 Dr. Francyne, you recently saw this pt in clinic. Are you able to comment on surgical clearance for upcoming apicoectomy scheduled for 05/19/2024? Please route your response to P CV DIV PREOP. Thank you!

## 2024-04-27 NOTE — Telephone Encounter (Signed)
 Pharmacy please advise on holding eliquis  prior to apicoectomy scheduled for 05/19/2024. Thank you.   Last labs: 02/05/2024

## 2024-04-28 NOTE — Telephone Encounter (Signed)
 Patient with diagnosis of afib on Eliquis  for anticoagulation.    Procedure: Apicoectomy  Date of procedure: 05/19/24   CHA2DS2-VASc Score = 3   This indicates a 3.2% annual risk of stroke. The patient's score is based upon: CHF History: 0 HTN History: 0 Diabetes History: 0 Stroke History: 0 Vascular Disease History: 0 Age Score: 2 Gender Score: 1      CrCl 49 ml/min Platelet count 354  Patient has not had an Afib/aflutter ablation in the last 3 months, DCCV within the last 4 weeks or a watchman implanted in the last 45 days   Per office protocol, patient can hold Eliquis  for 2 days prior to procedure.    **This guidance is not considered finalized until pre-operative APP has relayed final recommendations.**

## 2024-04-28 NOTE — Telephone Encounter (Signed)
" °  ° °  Primary Cardiologist: Jerel Balding, MD  Chart reviewed as part of pre-operative protocol coverage. Given past medical history and time since last visit, based on ACC/AHA guidelines, Monica [name] would be at acceptable risk for the planned procedure without further cardiovascular testing.   Procedure: Apicoectomy  Date of procedure: 05/19/24     CHA2DS2-VASc Score = 3   This indicates a 3.2% annual risk of stroke. The patient's score is based upon: CHF History: 0 HTN History: 0 Diabetes History: 0 Stroke History: 0 Vascular Disease History: 0 Age Score: 2 Gender Score: 1       CrCl 49 ml/min Platelet count 354   Patient has not had an Afib/aflutter ablation in the last 3 months, DCCV within the last 4 weeks or a watchman implanted in the last 45 days    Per office protocol, patient can hold Eliquis  for 2 days prior to procedure.  I will route this recommendation to the requesting party via Epic fax function and remove from pre-op  pool.  Please call with questions.  [name] [name] NP-C     04/28/2024, 8:29 AM [facility] [address]th [address] [place], [place] [address] Office [phone]      "

## 2024-05-04 ENCOUNTER — Ambulatory Visit: Admitting: Cardiovascular Disease

## 2024-05-11 ENCOUNTER — Ambulatory Visit: Admitting: Internal Medicine

## 2024-06-04 ENCOUNTER — Encounter: Payer: Self-pay | Admitting: Primary Care

## 2024-06-04 ENCOUNTER — Ambulatory Visit: Admitting: Primary Care

## 2024-06-04 NOTE — Progress Notes (Unsigned)
 "  @Patient  ID: Monica [name], female    DOB: 04/23/1946, 79 y.o.   MRN: [id number]  Chief Complaint  Patient presents with   Follow-up    Pt states persistent cough since COVID.     Referring provider: Garald Karlynn GAILS, MD  HPI:   06/04/2024 Discussed the use of AI scribe software for clinical note transcription with the patient, who gave verbal consent to proceed.  History of Present Illness Monica [name] is a 79 year old female with bronchiectasis who presents with chronic cough and recurrent exacerbations.  She has a history of bronchiectasis characterized by a chronic cough and recurrent exacerbations. She uses hypertonic saline and a therapy vest twice daily, though she feels the vest has not significantly changed her cough. Her cough can be dry or productive, with daily spells that irritate her throat, often occurring in the late afternoon. Her sputum is usually pale yellow, turning pink when her throat is irritated.  She has a history of gastric reflux, which sometimes makes it difficult to distinguish the cause of her cough. Her cough became more severe following a COVID-19 infection, marking the onset of heavy coughing spells. She uses Delsym  at night and sugarless cough drops during the day to manage her symptoms, along with honey and natural remedies like throat coat tea and mullein.  Her CT scan was reported as stable, and she has a history of elevated neutrophil counts. She has multiple drug allergies and sensitivities to antibiotics, complicating treatment options. She is currently on methotrexate  and Cimzia  for rheumatoid arthritis and wishes to discontinue methotrexate  due to its side effects.  She has a history of atrial fibrillation for which she underwent an ablation. She is concerned about potential interactions with new medications and the impact on her atrial fibrillation.     Allergies[1]  Immunization History  Administered Date(s) Administered    Fluad Quad(high Dose 65+) 12/29/2018, 02/21/2020, 02/12/2021, 03/25/2022   Fluad Trivalent(High Dose 65+) 02/27/2023   INFLUENZA, HIGH DOSE SEASONAL PF 01/28/2017, 03/22/2024   Influenza Split 03/03/2009, 02/25/2011, 02/05/2012   Influenza Whole 03/02/2008, 03/02/2009, 02/06/2010   Influenza,inj,Quad PF,6+ Mos 02/04/2013, 01/24/2014, 02/02/2015, 02/01/2016, 01/22/2018   Influenza-Unspecified 01/04/2017   PFIZER Comirnaty (Gray Top)Covid-19 Tri-Sucrose Vaccine 09/12/2020   PFIZER(Purple Top)SARS-COV-2 Vaccination 05/27/2019, 06/17/2019, 02/08/2020   Pfizer Covid-19 Vaccine Bivalent Booster 32yrs & up 02/06/2021   Pfizer(Comirnaty )Fall Seasonal Vaccine 12 years and older 02/25/2022, 02/14/2023, 03/05/2024   Pneumococcal Conjugate-13 01/04/2013, 02/03/2013, 02/16/2013   Pneumococcal Polysaccharide-23 03/11/2003, 01/16/2009, 09/26/2014   Respiratory Syncytial Virus Vaccine ,Recomb Aduvanted(Arexvy ) 03/28/2023   Td 06/06/2001   Tdap 07/17/2012, 08/28/2023   Tetanus 05/06/2001   Zoster Recombinant(Shingrix) 05/07/2007, 12/09/2017, 05/28/2018   Zoster, Live 05/07/2007    Past Medical History:  Diagnosis Date   Adenomatous colon polyp 10/2003   Allergic rhinitis    Allergy     SEASONAL   Bronchiectasis    Chronic sinusitis    Diverticulosis    GERD (gastroesophageal reflux disease)    Hearing loss    Hemorrhoids    Insomnia    Menopausal disorder    Osteoporosis    Palpitations    Paroxysmal A-fib (HCC)    Rheumatoid arthritis (HCC)    TMJ syndrome    Urticaria     Tobacco History: Tobacco Use History[2] Counseling given: Not Answered   Outpatient Medications Prior to Visit  Medication Sig Dispense Refill   acetaminophen  (TYLENOL ) 500 MG tablet Take 500 mg by mouth every 6 (six) hours as needed for  moderate pain or headache.     Butalbital -Acetaminophen  50-300 MG TABS Take 1 tablet by mouth 4 (four) times daily as needed for bad headaches. 60 tablet 1    Calcium -Magnesium -Zinc (CAL-MAG-ZINC PO) Take 1 tablet by mouth daily.     certolizumab pegol  (CIMZIA ) 2 X 200 MG KIT Per Rheumatology 1 kit 1   Cholecalciferol (VITAMIN D ) 1000 UNITS capsule Take 1,000 Units by mouth daily with lunch.     dextromethorphan  (DELSYM ) 30 MG/5ML liquid Take 30 mg by mouth at bedtime as needed for cough.      ELIQUIS  5 MG TABS tablet TAKE 1 TABLET TWICE A DAY 180 tablet 3   Estradiol 10 MCG TABS Place 10 mcg vaginally 3 (three) times a week.     folic acid (FOLVITE) 1 MG tablet Take 2 mg by mouth daily with lunch.     Lactobacillus-Inulin (CULTURELLE DIGESTIVE HEALTH PO) Take 1 capsule by mouth in the morning.     loratadine  (CLARITIN ) 10 MG tablet Take 10 mg by mouth in the morning.     methotrexate  (RHEUMATREX) 2.5 MG tablet Take 10 mg by mouth every Sunday. 6 tablets     metoprolol  succinate (TOPROL -XL) 25 MG 24 hr tablet Take 1 tablet (25 mg total) by mouth 2 (two) times daily.     Multiple Vitamins-Minerals (ONE A DAY WOMEN 50 PLUS) CHEW Chew 1 tablet by mouth daily.     Respiratory Therapy Supplies (FLUTTER) DEVI Twice a day and prn as needed, may increase if feeling worse 1 each 0   traMADol  (ULTRAM ) 50 MG tablet Take 1 tablet (50 mg total) by mouth every 4-6 hours as needed for pain 15 tablet 0   doxycycline  (VIBRAMYCIN ) 50 MG capsule Take 1 capsule (50 mg total) by mouth daily. 10 capsule 1   fluticasone  (FLONASE  ALLERGY  RELIEF) 50 MCG/ACT nasal spray Place 1 spray into both nostrils as needed. (Patient not taking: Reported on 03/10/2024)     guaiFENesin  (MUCINEX ) 600 MG 12 hr tablet Take 600 mg by mouth 2 (two) times daily as needed (congestion/cough). (Patient taking differently: Take 600 mg by mouth 2 (two) times daily as needed (congestion/cough). Pt stats takes daily)     metoprolol  tartrate (LOPRESSOR ) 25 MG tablet Take 0.5 tablets (12.5 mg total) by mouth daily as needed. (Patient not taking: Reported on 03/10/2024)     Polyethyl Glycol-Propyl Glycol  (SYSTANE ULTRA) 0.4-0.3 % SOLN Place 1 drop into both eyes 3 (three) times daily as needed. (Patient not taking: Reported on 03/10/2024)     sodium chloride  HYPERTONIC 3 % nebulizer solution Take by nebulization as directed as needed (Patient not taking: Reported on 03/10/2024) 720 mL 1   tobramycin-dexamethasone  (TOBRADEX) ophthalmic solution Instill 4 drops twice daily into right ear. (Patient not taking: Reported on 03/10/2024)     Zoledronic  Acid (RECLAST  IV) Inject into the vein. IV yearly (Patient not taking: Reported on 03/10/2024)     No facility-administered medications prior to visit.      Review of Systems  Review of Systems  Constitutional: Negative.   HENT: Negative.    Respiratory: Negative.    Cardiovascular: Negative.      Physical Exam  BP 119/76   Pulse 98   Ht 5' 1 (1.549 m) Comment: per pt  Wt 102 lb 6 oz (46.4 kg)   SpO2 100% Comment: on RA  BMI 19.34 kg/m  Physical Exam Constitutional:      Appearance: Normal appearance. She is well-developed.  HENT:  Head: Normocephalic and atraumatic.     Mouth/Throat:     Mouth: Mucous membranes are moist.     Pharynx: Oropharynx is clear.  Eyes:     Pupils: Pupils are equal, round, and reactive to light.  Cardiovascular:     Rate and Rhythm: Normal rate and regular rhythm.     Heart sounds: Normal heart sounds. No murmur heard. Pulmonary:     Effort: Pulmonary effort is normal. No respiratory distress.     Breath sounds: Normal breath sounds. No wheezing or rhonchi.  Musculoskeletal:        General: Normal range of motion.     Cervical back: Normal range of motion and neck supple.  Skin:    General: Skin is warm and dry.     Findings: No erythema or rash.  Neurological:     General: No focal deficit present.     Mental Status: She is alert and oriented to person, place, and time. Mental status is at baseline.  Psychiatric:        Mood and Affect: Mood normal.        Behavior: Behavior normal.         Thought Content: Thought content normal.        Judgment: Judgment normal.     ***  Lab Results:  CBC    Component Value Date/Time   WBC 9.9 02/05/2024 1040   RBC 4.19 02/05/2024 1040   HGB 13.4 02/05/2024 1040   HGB 13.2 09/20/2022 0949   HCT 39.8 02/05/2024 1040   HCT 39.4 09/20/2022 0949   PLT 354.0 02/05/2024 1040   PLT 388 09/20/2022 0949   MCV 95.0 02/05/2024 1040   MCV 95 09/20/2022 0949   MCH 31.7 01/26/2023 1235   MCHC 33.8 02/05/2024 1040   RDW 13.1 02/05/2024 1040   RDW 12.7 09/20/2022 0949   LYMPHSABS 1.0 02/05/2024 1040   LYMPHSABS 1.5 09/20/2022 0949   MONOABS 0.9 02/05/2024 1040   EOSABS 0.1 02/05/2024 1040   EOSABS 0.1 09/20/2022 0949   BASOSABS 0.0 02/05/2024 1040   BASOSABS 0.1 09/20/2022 0949    BMET    Component Value Date/Time   NA 134 (L) 01/29/2024 0855   NA 136 09/20/2022 0949   K 4.1 01/29/2024 0855   CL 99 01/29/2024 0855   CO2 28 01/29/2024 0855   GLUCOSE 93 01/29/2024 0855   BUN 19 01/29/2024 0855   BUN 19 09/20/2022 0949   CREATININE 0.67 01/29/2024 0855   CALCIUM  9.2 01/29/2024 0855   GFRNONAA >60 01/26/2023 1235   GFRAA >60 06/08/2018 0544    BNP    Component Value Date/Time   BNP 60.3 01/26/2023 1236    ProBNP    Component Value Date/Time   PROBNP 68.0 08/08/2020 0957    Imaging: No results found.   Assessment & Plan:   No problem-specific Assessment & Plan notes found for this encounter.   There are no diagnoses linked to this encounter.  Assessment and Plan Assessment & Plan Bronchiectasis with chronic cough Chronic cough associated with bronchiectasis, exacerbated post-COVID infection. Persistent cough with dry and productive episodes, often in the late afternoon. CT scan shows well-managed bronchiectasis. Elevated neutrophil count suggests inflammation-driven bronchiectasis. Discussed potential use of Brinsupri to reduce inflammation and exacerbations. Concerns about side effects, including headache,  rash, dry skin, and hyperkeratosis. No interaction with methotrexate . She prefers to avoid additional medications due to side effects and current medication burden. Discussed natural remedies for cough management. - Provided  information on Brinsupri for review. - Consider natural remedies such as throat coat tea and mullein supplement for cough management. - Continue current cough suppressants and chest therapy as needed.  Antibiotic intolerance Multiple drug allergies and antibiotic sensitivities complicate treatment options for potential infections. No current signs of infection or colored mucus.  Recording duration: 14 minutes      I personally spent a total of *** minutes in the care of the patient today including {Time Based Coding:[id number]}.   Almarie LELON Ferrari, NP 06/04/2024     [1]  Allergies Allergen Reactions   Amoxicillin  Diarrhea   Azithromycin  Diarrhea   Colchicine  Diarrhea   Moxifloxacin  Nausea Only   Nitrofurantoin  Diarrhea and Nausea Only   Sulfur Other (See Comments)   Albuterol  Other (See Comments)    Patient states put her into atrial fib last time she took this medication   Amoxicillin -Pot Clavulanate Diarrhea    Has patient had a PCN reaction causing immediate rash, facial/tongue/throat swelling, SOB or lightheadedness with hypotension:No Has patient had a PCN reaction causing severe rash involving mucus membranes or skin necrosis:No Has patient had a PCN reaction that required hospitalization:No Has patient had a PCN reaction occurring within the last 10 years:Yes If all of the above answers are NO, then may proceed with Cephalosporin use   Avelox  [Moxifloxacin  Hcl In Nacl] Nausea Only   Benzonatate Other (See Comments)    felt bad, out of sorts, disoriented.   Ciprofloxacin  Rash and Other (See Comments)    Rash across abdomen   Macrobid  [Nitrofurantoin  Macrocrystal] Diarrhea    Diarrhea, nausea   Septra [Sulfamethoxazole-Trimethoprim] Other (See  Comments)    flushing  [2]  Social History Tobacco Use  Smoking Status Never   Passive exposure: Never  Smokeless Tobacco Never   "

## 2024-06-04 NOTE — Patient Instructions (Signed)
" °  YOUR PLAN: -BRONCHIECTASIS WITH CHRONIC COUGH: Bronchiectasis is a condition where the airways in your lungs become damaged, leading to a chronic cough and difficulty clearing mucus. Your cough has worsened since your COVID-19 infection. We discussed the potential use of Brinsupri to reduce inflammation and exacerbations, but you prefer to avoid additional medications due to side effects. Consider trying using natural remedies like mullein or throat coat tea for cough. Continue chest therapy as needed.  -ANTIBIOTIC INTOLERANCE: You have multiple drug allergies and sensitivities to antibiotics, which complicates treatment options for potential infections. Currently, there are no signs of infection or colored mucus, so no antibiotics are needed at this time.  INSTRUCTIONS: Please review the information on Brinsupri that was provided. Continue with your current cough management strategies and chest therapy. If you notice any signs of infection or if your symptoms worsen, please contact our office. Follow up with your regular appointments to monitor your conditions.  Follow-up: 4 months with Dr. Shelah Mow text generated by [employer].   "

## 2024-08-31 ENCOUNTER — Ambulatory Visit

## 2024-10-06 ENCOUNTER — Ambulatory Visit: Admitting: Emergency Medicine
